# Patient Record
Sex: Male | Born: 1965 | Race: White | Hispanic: No | Marital: Single | Smoking: Former smoker
Health system: Southern US, Community
[De-identification: ages and names within clinical notes are randomized; demographics above are authoritative.]

## PROBLEM LIST (undated history)

## (undated) DIAGNOSIS — K219 Gastro-esophageal reflux disease without esophagitis: Secondary | ICD-10-CM

## (undated) DIAGNOSIS — N400 Enlarged prostate without lower urinary tract symptoms: Secondary | ICD-10-CM

## (undated) DIAGNOSIS — N1832 Chronic kidney disease, stage 3b: Secondary | ICD-10-CM

## (undated) DIAGNOSIS — F909 Attention-deficit hyperactivity disorder, unspecified type: Secondary | ICD-10-CM

## (undated) DIAGNOSIS — F419 Anxiety disorder, unspecified: Secondary | ICD-10-CM

## (undated) DIAGNOSIS — J189 Pneumonia, unspecified organism: Secondary | ICD-10-CM

## (undated) DIAGNOSIS — N529 Male erectile dysfunction, unspecified: Secondary | ICD-10-CM

## (undated) DIAGNOSIS — E039 Hypothyroidism, unspecified: Secondary | ICD-10-CM

## (undated) DIAGNOSIS — I129 Hypertensive chronic kidney disease with stage 1 through stage 4 chronic kidney disease, or unspecified chronic kidney disease: Secondary | ICD-10-CM

## (undated) DIAGNOSIS — K5900 Constipation, unspecified: Secondary | ICD-10-CM

## (undated) DIAGNOSIS — D649 Anemia, unspecified: Secondary | ICD-10-CM

## (undated) DIAGNOSIS — F32A Depression, unspecified: Secondary | ICD-10-CM

## (undated) DIAGNOSIS — Z8719 Personal history of other diseases of the digestive system: Secondary | ICD-10-CM

## (undated) DIAGNOSIS — I10 Essential (primary) hypertension: Secondary | ICD-10-CM

## (undated) DIAGNOSIS — F329 Major depressive disorder, single episode, unspecified: Secondary | ICD-10-CM

## (undated) DIAGNOSIS — J342 Deviated nasal septum: Secondary | ICD-10-CM

## (undated) DIAGNOSIS — Z9889 Other specified postprocedural states: Secondary | ICD-10-CM

## (undated) DIAGNOSIS — R112 Nausea with vomiting, unspecified: Secondary | ICD-10-CM

## (undated) DIAGNOSIS — M5030 Other cervical disc degeneration, unspecified cervical region: Secondary | ICD-10-CM

## (undated) DIAGNOSIS — E0500 Thyrotoxicosis with diffuse goiter without thyrotoxic crisis or storm: Secondary | ICD-10-CM

## (undated) DIAGNOSIS — F319 Bipolar disorder, unspecified: Secondary | ICD-10-CM

## (undated) HISTORY — DX: Depression, unspecified: F32.A

## (undated) HISTORY — PX: EYE SURGERY: SHX253

## (undated) HISTORY — PX: SPINE SURGERY: SHX786

## (undated) HISTORY — PX: HERNIA REPAIR: SHX51

## (undated) HISTORY — PX: BACK SURGERY: SHX140

## (undated) HISTORY — PX: NASAL SEPTUM SURGERY: SHX37

## (undated) HISTORY — DX: Major depressive disorder, single episode, unspecified: F32.9

## (undated) HISTORY — DX: Anxiety disorder, unspecified: F41.9

## (undated) HISTORY — DX: Gastro-esophageal reflux disease without esophagitis: K21.9

## (undated) HISTORY — PX: OTHER SURGICAL HISTORY: SHX169

## (undated) HISTORY — PX: ESOPHAGOGASTRODUODENOSCOPY: SHX1529

---

## 2002-08-27 ENCOUNTER — Inpatient Hospital Stay (HOSPITAL_COMMUNITY): Admission: EM | Admit: 2002-08-27 | Discharge: 2002-09-03 | Payer: Self-pay | Admitting: Psychiatry

## 2002-09-04 ENCOUNTER — Other Ambulatory Visit (HOSPITAL_COMMUNITY): Admission: RE | Admit: 2002-09-04 | Discharge: 2002-09-07 | Payer: Self-pay | Admitting: Psychiatry

## 2003-09-26 ENCOUNTER — Encounter: Admission: RE | Admit: 2003-09-26 | Discharge: 2003-09-26 | Payer: Self-pay | Admitting: Neurosurgery

## 2003-10-14 ENCOUNTER — Encounter: Admission: RE | Admit: 2003-10-14 | Discharge: 2003-10-14 | Payer: Self-pay | Admitting: Neurosurgery

## 2003-10-31 ENCOUNTER — Encounter: Admission: RE | Admit: 2003-10-31 | Discharge: 2003-10-31 | Payer: Self-pay | Admitting: Neurosurgery

## 2004-05-29 ENCOUNTER — Ambulatory Visit: Payer: Self-pay | Admitting: Internal Medicine

## 2004-06-03 ENCOUNTER — Ambulatory Visit: Payer: Self-pay | Admitting: Internal Medicine

## 2004-06-26 ENCOUNTER — Ambulatory Visit: Payer: Self-pay | Admitting: Gastroenterology

## 2004-07-22 ENCOUNTER — Ambulatory Visit: Payer: Self-pay | Admitting: Gastroenterology

## 2004-07-28 ENCOUNTER — Ambulatory Visit: Payer: Self-pay | Admitting: Surgery

## 2004-10-06 ENCOUNTER — Other Ambulatory Visit: Payer: Self-pay

## 2004-10-08 ENCOUNTER — Inpatient Hospital Stay: Payer: Self-pay | Admitting: Surgery

## 2005-11-26 ENCOUNTER — Ambulatory Visit: Payer: Self-pay | Admitting: Unknown Physician Specialty

## 2006-07-19 ENCOUNTER — Emergency Department: Payer: Self-pay | Admitting: Emergency Medicine

## 2009-02-10 ENCOUNTER — Ambulatory Visit: Payer: Self-pay | Admitting: Neurosurgery

## 2009-05-06 ENCOUNTER — Ambulatory Visit (HOSPITAL_COMMUNITY): Admission: RE | Admit: 2009-05-06 | Discharge: 2009-05-08 | Payer: Self-pay | Admitting: Neurosurgery

## 2009-10-14 ENCOUNTER — Ambulatory Visit: Payer: Self-pay | Admitting: Neurosurgery

## 2010-02-11 ENCOUNTER — Ambulatory Visit: Payer: Self-pay | Admitting: Neurosurgery

## 2010-08-03 LAB — COMPREHENSIVE METABOLIC PANEL
ALT: 19 U/L (ref 0–53)
Alkaline Phosphatase: 59 U/L (ref 39–117)
GFR calc Af Amer: >60 mL/min (ref >60)
GFR calc non Af Amer: >60 mL/min (ref >60)
Potassium: 4.4 mEq/L (ref 3.5–5.1)
Sodium: 139 mEq/L (ref 135–145)
Total Bilirubin: 0.6 mg/dL (ref 0.3–1.2)

## 2010-08-03 LAB — DIFFERENTIAL
Eosinophils Absolute: 0.5 10*3/uL (ref 0.0–0.7)
Eosinophils Relative: 7 % — ABNORMAL HIGH (ref 0–5)
Lymphocytes Relative: 34 % (ref 12–46)
Lymphs Abs: 2.5 10*3/uL (ref 0.7–4.0)
Neutro Abs: 3.8 10*3/uL (ref 1.7–7.7)
Neutrophils Relative %: 50 % (ref 43–77)

## 2010-08-03 LAB — CBC
Hemoglobin: 15.9 g/dL (ref 13.0–17.0)
MCHC: 34.7 g/dL (ref 30.0–36.0)
MCV: 92.5 fL (ref 78.0–100.0)
RBC: 4.94 MIL/uL (ref 4.22–5.81)
RDW: 12.9 % (ref 11.5–15.5)
WBC: 7.6 10*3/uL (ref 4.0–10.5)

## 2010-08-03 LAB — URINALYSIS, ROUTINE W REFLEX MICROSCOPIC
Glucose, UA: 100 mg/dL — AB
Ketones, ur: NEGATIVE mg/dL
Nitrite: NEGATIVE
Protein, ur: NEGATIVE mg/dL
Specific Gravity, Urine: 1.021 (ref 1.005–1.030)
Urobilinogen, UA: 0.2 mg/dL (ref 0.0–1.0)

## 2010-08-03 LAB — URINE MICROSCOPIC-ADD ON

## 2010-08-03 LAB — PROTIME-INR
INR: 0.89 (ref 0.00–1.49)
Prothrombin Time: 12 seconds (ref 11.6–15.2)

## 2010-08-03 LAB — APTT: aPTT: 25 seconds (ref 24–37)

## 2010-09-18 NOTE — H&P (Signed)
NAME:  Albert [name], Albert [name]                                ACCOUNT NO.:  [id number]   MEDICAL RECORD NO.:  [id number]                   PATIENT TYPE:  IPS   LOCATION:  0505                                 FACILITY:  [facility]   PHYSICIAN:  Albert [name], M.D.                   DATE OF BIRTH:  09-Apr-1966   DATE OF ADMISSION:  08/27/2002  DATE OF DISCHARGE:                         PSYCHIATRIC ADMISSION ASSESSMENT   IDENTIFYING INFORMATION:  A 45 year old single white male, voluntarily  admitted on August 26, 2001.   HISTORY OF PRESENT ILLNESS:  The patient presents with a history of  depression, passive suicidal thoughts.  The patient reports no sleep for the  past week, sleeping only about 2 hours per night, having racing thoughts  that he attributes to having decreased sleep.  He has been having fleeting  suicidal thoughts with no specific plan.  He feels very anxious and having  difficulty concentrating.  He denies any psychotic symptoms.  He states he  has tried several different sleep medications without any relief, and his  appetite has been fair.   PAST PSYCHIATRIC HISTORY:  First admission to [facility], has  a long history of depression.  He has an appointment with Dr. Jennelle Human, first  appointment on May 19.   SOCIAL HISTORY:  He is a 45 year old single white male with no children.  He  is an Product/process development scientist.  He works for the [employer].  He lives with his  parents, currently attending school for further education at [employer] and is working 2 jobs.   FAMILY HISTORY:  His father and grandfather committed suicide.  The patient  was 45 years old when his father committed suicide.   ALCOHOL DRUG HISTORY:  The patient has a past history of alcohol abuse, has  been sober for the past 6 months.  He denies any drug use.   PAST MEDICAL HISTORY:  Primary care [name] is Dr. Randa Lynn in [place].  Medical problems are a recent history of anemia with unknown etiology,  and  GERD.  The patient sees Dr. Sharen Hint, his gastroenterologist in [place].   MEDICATIONS:  Nexium 40 mg every day.  The patient was tried on Risperdal to  help him sleep but he states his depression thoughts became worse.   DRUG ALLERGIES:  No known allergies.   PHYSICAL EXAMINATION:  Of significance is patient's complaints of sinusitis.  His right tympanic membrane is red and inflamed.  He has scar tissue that  was noted on the right tympanic membrane from tubes.  No nasal discharge,  negative lymphadenopathy.  No sinus tenderness.  The patient also has some  nystagmus in his right eye and the patient has dentures.  VITAL SIGNS:  Temperature 99.8, heart rate 87, 18 respirations, blood  pressure 131/86.  CHEST:  Clear.   LABORATORY DATA:  CBC:  RDW  is 20.6, platelet is 402.   MENTAL STATUS EXAM:  He is an alert, cooperative male, casually dressed,  good eye contact.  Speech is clear.  Patient is very talkative.  Mood is  depressed and anxious.  The patient appears anxious.  Thought processes are  coherent, no evidence of psychosis, no suicidal or homicidal ideation or  flight of ideas.  Cognitive function intact.  Memory is good, judgment and  insight fair.   ADMISSION DIAGNOSES:   AXIS I:  1. Major depression with suicidal ideation.  2. History of alcohol abuse in partial remission.  3. Rule out bipolar.  4. Rule out anxiety disorder not otherwise specified.   AXIS II:  Deferred.   AXIS III:  History of anemia and gastroesophageal reflux disease.   AXIS IV:  Problems with occupation and other psychosocial problems.   AXIS V:  Current is 35, estimated this past year 50.   PLAN:  Voluntary admission for depression and suicidal thoughts.  Contract  for safety, check every 15 minutes.  Will resume his medications, will have  Seroquel for anxiety and sleep.  We will avoid benzos at this time due to  the patient's recent alcohol abuse.  Monitor his labs, will initiate an   antidepressant for depressive and anxious symptoms.  Medication compliance  was discussed with the patient.  We will have amoxicillin available for the  patient's complaints of otitis media and sinusitis.  Encourage fluids and  continue to monitor the patient's upper respiratory infection.  Stabilize  mood and thinking so the patient can be safe.  The patient is to follow up  with Dr. Jennelle Human and consider individual therapy for this patient.   TENTATIVE LENGTH OF CARE:  3-5 days.     Albert [name], N.P.                       Albert [name], M.D.    JO/MEDQ  D:  08/29/2002  T:  08/29/2002  Job:  [id number]

## 2010-09-18 NOTE — Discharge Summary (Signed)
NAME:  Albert [name], Albert [name]                                ACCOUNT NO.:  [id number]   MEDICAL RECORD NO.:  [id number]                   PATIENT TYPE:  IPS   LOCATION:  0505                                 FACILITY:  [facility]   PHYSICIAN:  Geoffery Lyons, M.D.                   DATE OF BIRTH:  01-28-1966   DATE OF ADMISSION:  08/27/2002  DATE OF DISCHARGE:  09/03/2002                                 DISCHARGE SUMMARY   CHIEF COMPLAINT AND PRESENT ILLNESS:  This was the first admission to [facility] for this 45 year old single white male voluntarily  admitted.  History of depression.  Passive suicidal thoughts.  No sleep for  the past week, sleeping two hours per night.  Racing thoughts that he  attributes to having decreased sleep.  Having fleeting suicidal thoughts  with no specific plan.  Anxious, having difficulty concentrating.  No  psychotic symptoms.   PAST PSYCHIATRIC HISTORY:  First time at [facility].  No history of  depression.  First appointment with Dr. Jennelle Human on Sep 19, 2002.   ALCOHOL/DRUG HISTORY:  History of alcohol abuse.  Has been sober for six  months.  No drug use.   PAST MEDICAL HISTORY:  Anemia.   MEDICATIONS:  Nexium 40 mg per day.   PHYSICAL EXAMINATION:  Performed and failed to show any acute findings.   MENTAL STATUS EXAM:  Alert, cooperative male.  Casually dressed.  Good eye  contact.  Speech is clear.  The patient is talkative.  Mood is depressed and  anxious.  The patient appears anxious.  Thought processes are coherent.  No  evidence of psychosis.  No auditory or visual hallucinations.  No flight of  ideas.  Cognition well-preserved.   ADMISSION DIAGNOSES:   AXIS I:  1. Major depression, recurrent.  2. Anxiety disorder not otherwise specified.  3. Alcohol abuse in partial remission.   AXIS II:  No diagnosis.   AXIS III:  1. Anemia.  2. Gastroesophageal reflux.   AXIS IV:  Moderate.   AXIS V:  Global Assessment of Functioning  upon admission 35; highest Global  Assessment of Functioning in the last year 70.   LABORATORY DATA:  CBC with hemoglobin 16.6.  Blood chemistries were within  normal limits.  Thyroid profile within normal limits.  Drug screen negative  for substances of abuse.   HOSPITAL COURSE:  He was admitted and started intensive individual and group  psychotherapy.  He was given Nexium 40 mg per day, trazodone for sleep,  Ativan as needed for anxiety.  Was started on Lexapro 5 mg per day.  Could  not sleep on the trazodone so Seroquel was increased to 100 mg at bedtime  and he was given 25 mg as needed for anxiety.  He was given some Allegra and  amoxicillin.  Due to symptoms suggestive of  ADHD, he was placed on Strattera  40 mg per day.  Lexapro was increased to 5 mg twice a day and Strattera was  increased to 80 mg per day.  He was not able to sleep for 20 days.  He has  been having racing thoughts.  History of depression.  Possibility of  bipolarity was considered.  The feeling was that he evidenced more of adult  ADHD as well as anxiety.  He had a longstanding history of depression with  early losses.  Father committed suicide, another death, uncertainty fears.  No history of hyperactivity, impulsivity, inattentiveness.  Increased  frustration.  Loss of control.  Some pressured speech that could be  interrupted.  Mood was depressed.  We continued to work to stabilize.  On  Sep 03, 2002, he was in full contact with reality.  No suicidal ideation.  No  homicidal ideation.  Was sleeping well.  Underlying anxiety, anticipating  discharge but mood was more euthymic.  Anticipating to continue working with  medication and with outpatient treatment.   DISCHARGE DIAGNOSES:   AXIS I:  1. Major depression, recurrent.  2. Adult attention-deficit hyperactivity disorder.  3. Anxiety disorder not otherwise specified.  4. Alcohol abuse, in partial remission.   AXIS II:  No diagnosis.   AXIS III:   Gastroesophageal reflux.   AXIS IV:  Moderate.   AXIS V:  Global Assessment of Functioning upon discharge 55-60.   DISCHARGE MEDICATIONS:  1. Protonix 40 mg 2 daily.  2. Seroquel 100 mg at night.  3. Allegra 60 mg daily.  4. Amoxicillin 500 mg every 12 hours x 2 days.  5. Strattera 40 mg, 2 daily.  6. Lexapro 10 mg a day.   FOLLOW UP:  Dr. Meredith Staggers.                                               Geoffery Lyons, M.D.    IL/MEDQ  D:  10/10/2002  T:  10/11/2002  Job:  [id number]

## 2011-04-29 ENCOUNTER — Ambulatory Visit: Payer: Self-pay | Admitting: Urology

## 2013-12-05 ENCOUNTER — Ambulatory Visit: Payer: Self-pay | Admitting: Psychiatry

## 2014-03-13 ENCOUNTER — Ambulatory Visit: Payer: Self-pay | Admitting: Psychiatry

## 2014-03-18 ENCOUNTER — Observation Stay: Payer: Self-pay | Admitting: Internal Medicine

## 2014-03-18 LAB — COMPREHENSIVE METABOLIC PANEL
ALBUMIN: 3.2 g/dL — AB (ref 3.4–5.0)
ALK PHOS: 105 U/L
ALT: 16 U/L
ANION GAP: 6 — AB (ref 7–16)
BILIRUBIN TOTAL: 0.3 mg/dL (ref 0.2–1.0)
BUN: 6 mg/dL — AB (ref 7–18)
CALCIUM: 8.8 mg/dL (ref 8.5–10.1)
CHLORIDE: 107 mmol/L (ref 98–107)
Co2: 25 mmol/L (ref 21–32)
Creatinine: 1.06 mg/dL (ref 0.60–1.30)
Glucose: 129 mg/dL — ABNORMAL HIGH (ref 65–99)
Osmolality: 275 (ref 275–301)
POTASSIUM: 3.9 mmol/L (ref 3.5–5.1)
SGOT(AST): 16 U/L (ref 15–37)
Sodium: 138 mmol/L (ref 136–145)
Total Protein: 7.3 g/dL (ref 6.4–8.2)

## 2014-03-18 LAB — URINALYSIS, COMPLETE
BILIRUBIN, UR: NEGATIVE
Bacteria: NONE SEEN
Blood: NEGATIVE
GLUCOSE, UR: NEGATIVE mg/dL (ref 0–75)
Hyaline Cast: 2
KETONE: NEGATIVE
Leukocyte Esterase: NEGATIVE
NITRITE: NEGATIVE
PH: 5 (ref 4.5–8.0)
Protein: NEGATIVE
SQUAMOUS EPITHELIAL: NONE SEEN
Specific Gravity: 1.013 (ref 1.003–1.030)

## 2014-03-18 LAB — TROPONIN I

## 2014-03-18 LAB — CBC
HCT: 38.6 % — AB (ref 40.0–52.0)
HGB: 12.6 g/dL — ABNORMAL LOW (ref 13.0–18.0)
MCH: 29.3 pg (ref 26.0–34.0)
MCHC: 32.7 g/dL (ref 32.0–36.0)
MCV: 90 fL (ref 80–100)
PLATELETS: 343 10*3/uL (ref 150–440)
RBC: 4.32 10*6/uL — ABNORMAL LOW (ref 4.40–5.90)
RDW: 13 % (ref 11.5–14.5)
WBC: 12.4 10*3/uL — ABNORMAL HIGH (ref 3.8–10.6)

## 2014-03-18 LAB — T4, FREE: Free Thyroxine: 2.42 ng/dL — ABNORMAL HIGH (ref 0.76–1.46)

## 2014-03-18 LAB — LITHIUM LEVEL: Lithium: 2.12 mmol/L

## 2014-03-18 LAB — TSH: Thyroid Stimulating Horm: <0.01 u[IU]/mL — ABNORMAL LOW

## 2014-03-19 LAB — CBC WITH DIFFERENTIAL/PLATELET
BASOS ABS: 0 10*3/uL (ref 0.0–0.1)
BASOS PCT: 0.3 %
Eosinophil #: 0.4 10*3/uL (ref 0.0–0.7)
Eosinophil %: 3.2 %
HCT: 36.4 % — AB (ref 40.0–52.0)
HGB: 11.7 g/dL — AB (ref 13.0–18.0)
Lymphocyte #: 2.3 10*3/uL (ref 1.0–3.6)
Lymphocyte %: 18.5 %
MCH: 28.6 pg (ref 26.0–34.0)
MCHC: 32 g/dL (ref 32.0–36.0)
MCV: 90 fL (ref 80–100)
Monocyte #: 0.8 x10 3/mm (ref 0.2–1.0)
Monocyte %: 6.6 %
Neutrophil #: 8.9 10*3/uL — ABNORMAL HIGH (ref 1.4–6.5)
Neutrophil %: 71.4 %
Platelet: 308 10*3/uL (ref 150–440)
RBC: 4.07 10*6/uL — AB (ref 4.40–5.90)
RDW: 12.6 % (ref 11.5–14.5)
WBC: 12.5 10*3/uL — ABNORMAL HIGH (ref 3.8–10.6)

## 2014-03-19 LAB — BASIC METABOLIC PANEL WITH GFR
Anion Gap: 6 — ABNORMAL LOW
BUN: 6 mg/dL — ABNORMAL LOW
Calcium, Total: 8.6 mg/dL
Chloride: 113 mmol/L — ABNORMAL HIGH
Co2: 25 mmol/L
Creatinine: 0.85 mg/dL
EGFR (African American): >60
EGFR (Non-African Amer.): >60
Glucose: 110 mg/dL — ABNORMAL HIGH
Osmolality: 285
Potassium: 3.8 mmol/L
Sodium: 144 mmol/L

## 2014-03-19 LAB — LITHIUM LEVEL: Lithium: 1.4 mmol/L — ABNORMAL HIGH

## 2014-03-20 LAB — CBC WITH DIFFERENTIAL/PLATELET
BASOS ABS: 0.1 10*3/uL (ref 0.0–0.1)
Basophil %: 0.5 %
EOS ABS: 0.4 10*3/uL (ref 0.0–0.7)
Eosinophil %: 3.8 %
HCT: 38.4 % — ABNORMAL LOW (ref 40.0–52.0)
HGB: 12.6 g/dL — ABNORMAL LOW (ref 13.0–18.0)
LYMPHS PCT: 28.4 %
Lymphocyte #: 3.2 10*3/uL (ref 1.0–3.6)
MCH: 29 pg (ref 26.0–34.0)
MCHC: 32.8 g/dL (ref 32.0–36.0)
MCV: 89 fL (ref 80–100)
Monocyte #: 1 x10 3/mm (ref 0.2–1.0)
Monocyte %: 8.7 %
Neutrophil #: 6.5 10*3/uL (ref 1.4–6.5)
Neutrophil %: 58.6 %
Platelet: 346 10*3/uL (ref 150–440)
RBC: 4.34 10*6/uL — AB (ref 4.40–5.90)
RDW: 12.5 % (ref 11.5–14.5)
WBC: 11.2 10*3/uL — AB (ref 3.8–10.6)

## 2014-03-20 LAB — LITHIUM LEVEL: LITHIUM: 0.82 mmol/L

## 2014-03-20 LAB — BASIC METABOLIC PANEL
Anion Gap: 4 — ABNORMAL LOW (ref 7–16)
BUN: 7 mg/dL (ref 7–18)
CHLORIDE: 112 mmol/L — AB (ref 98–107)
CREATININE: 0.78 mg/dL (ref 0.60–1.30)
Calcium, Total: 9.5 mg/dL (ref 8.5–10.1)
Co2: 26 mmol/L (ref 21–32)
EGFR (Non-African Amer.): >60
Glucose: 99 mg/dL (ref 65–99)
Osmolality: 281 (ref 275–301)
Potassium: 4.1 mmol/L (ref 3.5–5.1)
SODIUM: 142 mmol/L (ref 136–145)

## 2014-03-21 ENCOUNTER — Ambulatory Visit: Payer: Self-pay

## 2014-07-12 ENCOUNTER — Ambulatory Visit: Payer: Self-pay | Admitting: Unknown Physician Specialty

## 2014-08-24 NOTE — Consult Note (Signed)
Allergies:  Prednisone: Other  Assessment/Plan:  Assessment/Plan Patient is a 49 y.o M seen in consultation for hyperthyroidism. He has a h.o biopolar disorder treated with lithium. Brought in by mother with AMS. Lithium level found to be elevated.   Pt was seen by me in clinic as a consult for hyperthyroidism last week. He has had hyperthyroidism for several months, yet untreated. 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 I ordered and scheduled a thyroid uptake/scan, which is scheduled for tomorrow 11/18 and Thurs 11/19.   Pt was interviewed and examined and chart reviewed.  Today he is confused and mixes his words. This is a change in his mental status from last week.   Labs show TSH <0.015 and free T4 2.4 (lower than level last week of 2.6).  A/ Biopolar disorder Lithium toxicity Grave's hyperthyroidism  P/ -I attribute his anxiety, tremor, heart racing to Grave's disease. -Not clear if his AMS is due to hyperthyroidism, as he has been hyperthyroid for many months and yet this AMS started just 2 days ago -Will start propranolol 60 mg bid for tachycardia and tremor. Will titrate dose to HR <100. -If possible, would like pt to have his thyroid uptake/scan as scheduled tomorrow and Thursday. However, if this is scheduled for the [address] [facility] site, then we may not be able to get this done. I called [facility] today but they were closed. -Will await starting methimazole until we get the uptake/scan done, or if we cannot get it done then will start it tomorrow. -Please AVOID IODINATED CONTRAST DYE as this will exacerbate his hyperthyroidism  Full consult to be dictated.   Electronic Signatures: Raj JanusSolum, Yosef Krogh M (MD)  (Signed 17-Nov-15 17:19)  Authored: ALLERGIES, Assessment/Plan   Last Updated: 17-Nov-15 17:19 by Raj JanusSolum, Yesmin Mutch M (MD)

## 2014-08-24 NOTE — H&P (Signed)
PATIENT NAME:  Albert [name], Albert [name] MR#:  [id number] DATE OF BIRTH:  May 28, 1965  DATE OF ADMISSION:  03/18/2014  PRIMARY CARE PHYSICIAN:  Kandyce RudMarcus Babaoff, MD   PSYCHIATRIST:  Kirstie Periarey G Cottle, [place], in [place].   REFERRING EMERGENCY ROOM PHYSICIAN:  Onnie BoerKevin [name] Paduchowski, MD   CHIEF COMPLAINT: Mania.   HISTORY OF PRESENT ILLNESS: This very pleasant 49 year old man presents today accompanied by his mother with complaint of mania. He has [name] history of bipolar disorder. He is very anxious and unable to focus, so his mother provides the history. She reports that he has been having some difficulty with his memory for the past few weeks. It has been mainly short-term memory that has been affected. He has been taking classes, at [employer], but had to drop out of these classes over the past few weeks due to inability to focus. Yesterday the patient had dinner with his mother and she felt that he was behaving fairly normally. Today, she spoke to him on the phone; he was confused, hallucinating, unable to focus on the conversation. She went to his home to pick him up and brought him to the Emergency Room. At the time of examination he is very talkative, tangential, tremulous, hospitalist services are asked to admit for further evaluation and treatment. On presentation to the Emergency Room, his lithium level is noted to be elevated at 2.5.   PAST MEDICAL HISTORY: Problem:  1.  Bipolar disorder.  2.  Benign prosthetic hypertrophy.  3.  Esophageal reflux disease.  4.  Hyperthyroidism, the patient has seen  [name]. Wendall MolaMelissa Solum, MD    PAST SURGICAL HISTORY: 1.  Hiatal hernia repair.   SOCIAL HISTORY: The patient lives alone, but is in contact with his mother daily. He also has [name] close friend who is [name] Engineer, civil (consulting)nurse, who assisted in his care. He does not smoke cigarettes, drink alcohol, or use any illicit substances. He is disabled.   FAMILY HISTORY: Positive for coronary artery disease in several grandparents. His father died of  suicide.   ALLERGIES: THE PATIENT IS ALLERGIC TO PREDNISONE.   HOME MEDICATIONS: 1.  Zyrtec 10 mg 1 tablet once [name] day.  2.  Seroquel 200 mg 2 tablets once [name] day at bedtime.  3.  Rexulti 2 mg 1 tablet once [name] day; note that the patient has not been taking [name] full dose of this medication, this was started about 6 weeks ago and it sounds as if he may have even stopped it 1 week ago.  4.  Pramipexole 0.125 mg 1 tablet 3 times [name] day.  5.  Omeprazole 20 mg 1 tablet once [name] day.  6.  Mucinex 1 tablet every 4 hours as needed for cough.  7.  Lithium 300 mg 3 tablets once [name] day at bedtime, 300 mg 1 capsule [name] day in the morning.  8.  Ibuprofen 200 mg 4 capsules every 6 hours as needed for pain.  9.  Flomax 0.4 mg 1 capsule once [name] day.  10.  Clonidine 0.1 mg orally twice [name] day.  11.  Clonazepam 0.5 mg 1 tablet twice [name] day, 0.5 mg 2 tablets once [name] day at bedtime.   REVIEW OF SYSTEMS: The patient is unable to perform review of systems due to altered mental status, inability to focus.   PHYSICAL EXAMINATION: VITAL SIGNS: Temperature 98.9, pulse 95, respirations 26, blood pressure 118/81, oxygenation 99% on room air.  GENERAL: The patient is extremely anxious cannot stop moving.  HEENT: Pupils are  equal, round, and reactive to light. Conjunctivae are clear with no icterus or injection, mucous membranes are dry. Good dentition. Posterior oropharynx is clear with no exudate or edema.  NECK: No cervical lymphadenopathy, trachea is midline, thyroid is nontender.  RESPIRATORY: Lungs are clear to auscultation bilaterally with good air movement.  CARDIOVASCULAR: Tachycardic, regular. No edema. Peripheral pulses are 2+.  ABDOMEN: Soft, nontender, nondistended, bowel sounds are normal. No guarding, no rebound.  MUSCULOSKELETAL: No joint effusions, range of motion is normal, strength is 5/5 throughout.  NEUROLOGIC: Cranial nerves II through XII are intact; strength and sensation are intact, he is very tremulous, has  [name] tremor in both hands and both feet.  PSYCHIATRIC: The patient is anxious, tangential, slightly agitated; he is alert; he is oriented but unable to focus on questioning.   LABORATORY: Sodium 138, potassium 3.9, chloride 107, bicarbonate 25, BUN 6, creatinine 1.06 glucose 129; LFTs normal with exception of decreased albumin at 3.2; troponin negative; TSH decreased at 0.01, thyroxine elevated at 2.42, lithium elevated at 2.12, white blood cells 12.4, hemoglobin 12.6, hematocrit 38.6, platelets 343,000, MCV 90; urinalysis negative for signs of infection.   IMAGING: CT of the head without contrast shows no acute intracranial abnormality, probable bilateral ethmoid sinusitis with mild diffuse cortical atrophy.   ASSESSMENT AND PLAN: 1.  Bipolar disorder with current manic state: We will treat with Ativan at this time, we will consult psychiatry for further evaluation and treatment.  2.  Lithium toxicity: Hold lithium for now. Provide aggressive hydration. EKG shows no changes. Admit to telemetry.  3.  Hyperthyroidism: Repeat thyroid studies indicate persistent hyperthyroidism. We will get endocrinology consultation, may need to start methimazole treatment.  4.  Benign prostatic hypertrophy: Continue Flomax.  5.  Prophylaxis: The patient is [name] very active, ambulatory, we will hold off on pharmacologic deep vein thrombosis prophylaxis.   TIME SPENT ON ADMISSION: Was 45 minutes.    ____________________________ Ena Dawley. Clent Ridges, MD cpw:nt D: 03/18/2014 22:45:15 ET T: 03/18/2014 23:13:47 ET JOB#: [id number]  cc: [name] Evans P. Clent Ridges, MD, <Dictator> Gale Journey MD ELECTRONICALLY SIGNED 03/21/2014 13:20

## 2014-08-24 NOTE — Consult Note (Signed)
Brief Consult Note: Diagnosis: delirium poss related lithium toxicity/other causes.   Consult note dictated.   Recommend further assessment or treatment.   Orders entered.   Comments: Psychiatry: Chart reviewwed. Hx obtained from mother. Patient asleep and she asks we not waken him right now. Sounds like possible combination of causes of delirium but Li level was clearly elevated. Agree with plan for MRI and to hold his rexulti and pramapexole and, obviously, lithium. Ordered recheck lithium tomorrow am. Continue IVF. Look forward to endocrine input regarding hyperthyroid and with order frequent prn doses of IV ativan and haldol for agitation and confusion. Will follow. I have left a message at office of Dr Jennelle Humanottle, outpt psychiatrist, to call me back.  Electronic Signatures: Nashay Brickley, Jackquline DenmarkJohn T (MD)  (Signed 17-Nov-15 13:13)  Authored: Brief Consult Note   Last Updated: 17-Nov-15 13:13 by Audery Amellapacs, Deverick Pruss T (MD)

## 2014-08-24 NOTE — Consult Note (Signed)
PATIENT NAME:  Albert Albert [name], Albert Albert [name] MR#:  [id number] DATE OF BIRTH:  1966-04-07  DATE OF CONSULTATION:  03/19/2014  REFERRING PHYSICIAN:  Sital P. Benjie Karvonen, MD  CONSULTING PHYSICIAN:  Albert [name]. Lavone Orn, MD  CHIEF COMPLAINT:  Hyperthyroidism.   HISTORY OF PRESENT ILLNESS:  This is Albert [name] 49 year old male with Albert [name] history of hyperthyroidism and bipolar disorder, who was admitted yesterday for altered mental status. He was initially found to have an elevated lithium level of 2.12, low TSH of 0.10, and an elevated free T4 level of 2.42. Noncontrast head CT showed no acute findings. The patient has been admitted, and psychiatry has been following. His lithium has been held.   The patient presented to me last week to clinic with hyperthyroidism. Review of labs indicates he has had hyperthyroidism since at least July 2015. No use of amiodarone, glucocorticoids, or known exposure to iodinated contrast dye. He has taken lithium for the last 3 years. I confirmed hyperthyroidism and also obtained Albert [name] thyrotropin antibody level, which was elevated and consistent with autoimmune hyperthyroidism. Albert [name] thyroid uptake/scan was ordered, and these are scheduled for tomorrow and Wednesday. The patient reports chronic tremor, unchanged. He denies heart racing or palpitations. No heat intolerance. He has had Albert [name] 10 to 12-pound weight loss over the last 3 months. No neck pain or recent illness.   PAST MEDICAL HISTORY: 1.  Bipolar disorder.  2.  BPH.  3.  Hiatal hernia.  4.  GERD.  5.  Irritable bowel syndrome.  6.  Erectile dysfunction.  7.  Hyperthyroidism.   PAST SURGICAL HISTORY: 1.  Hernia repair.  2.  Deviated septum repair.  3.  Strabismus surgery.  4.  Nissen fundoplication.   OUTPATIENT MEDICATIONS INCLUDE:  1.  Klonopin 1 mg 1 to 4 tablets at bedtime p.r.n. insomnia.  2.  Lithium 1200 mg nightly.  3.  Prilosec 20 mg daily.  4.  Quetiapine 200 mg 2 times daily.  5.  Sildenafil 100 mg once daily.  6.  Flomax 0.8 mg once  daily.   SOCIAL HISTORY:  No tobacco use. No known alcohol use.   FAMILY HISTORY:  No known thyroid disease.   REVIEW OF SYSTEMS: GENERAL:  No fever. He has had fatigue.  HEENT:  Denies blurred vision. Denies sore throat.  NECK:  Denies neck pain or swelling.  CARDIAC:  Denies chest pain or palpitations.  PULMONARY:  The patient has shortness of breath. He has had Albert [name] cough.  GASTROINTESTINAL:  Denies abdominal pain. Denies nausea and vomiting.  GENITOURINARY:  Denies dysuria or hematuria.  EXTREMITIES:  Denies leg swelling. Denies weakness of the extremities.  SKIN:  No rash or recent skin changes.  HEMATOLOGIC:  Denies easy bruisability or recent bleeding.   PHYSICAL EXAMINATION: VITAL SIGNS:  Height is 67.9 inches, weight 185 pounds, and BMI is 28.2. Temperature is 99.5, pulse 86, respirations 18, and blood pressure is 131/80.  GENERAL:  Well-developed white male in no acute distress.  HEENT:  No proptosis, lid lag, or stare. Oropharynx is clear. Mucous membranes are moist.  NECK:  Supple. No appreciable thyromegaly. No neck tenderness.  CARDIAC:  Tachycardia without murmur. No carotid bruit.  LUNGS:  Clear to auscultation bilaterally. No wheeze.  ABDOMEN:  Diffusely soft, nontender, nondistended.  EXTREMITIES:  No peripheral edema is present.  NEUROLOGIC:  There is Albert [name] fine tremor of the outstretched hands.  PSYCHIATRIC:  Alert and oriented. Seems confused. Some trouble with confusing words, although he is conversational and appropriate.  LABORATORY DATA:  Glucose is 110, BUN 6, creatinine 0.85, sodium 144, potassium 3.8, and chloride is 113. EGFR is greater than 60. Initial lithium level was 2.12; repeat today is 1.40. Hemoglobin is 11.7, hematocrit 36.4, and WBC is 12.5.   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 He has had hyperthyroidism for several months, again initially presented to endocrinology only one week ago. He is not  yet started on medical therapy, as he has been scheduled for Albert [name] thyroid uptake/scan tomorrow. He is now with new-onset altered mental status; this is felt to be unlikely due to his hyperthyroidism, which again has been persistent for several months. Suspect his lithium toxicity is playing Albert [name] role as well as his underlying bipolar disorder.   RECOMMENDATIONS: 1.  If possible, the patient is to undergo Albert [name] thyroid uptake/scan tomorrow and Wednesday. We will see if this is possible. If not, it will need to be canceled, and he could then start methimazole to better control his hyperthyroidism.  2.  Propranolol 60 mg b.i.d.  3.  Continue workup for altered mental status as you are.   Thank you for the kind request for consultation. I will follow along with you.    ____________________________ Albert [name]. Lavone Orn, MD ams:nb D: 03/19/2014 21:46:20 ET T: 03/19/2014 21:57:51 ET JOB#: [id number]  cc: Albert [name]. Lavone Orn, MD, <Dictator> Sherlon Handing MD ELECTRONICALLY SIGNED 04/03/2014 17:35

## 2014-08-24 NOTE — Discharge Summary (Signed)
PATIENT NAME:  Albert [name] MR#:  [id number] DATE OF BIRTH:  18-Sep-1965  ADMISSION DIAGNOSIS: Altered mental status.   DISCHARGE DIAGNOSES: 1.  Acute metabolic encephalopathy secondary to elevated lithium level.  2.  Hyperthyroidism.  3.  Bipolar affective disorder with lithium toxicity.  4.  BPH.  5.  Cough and probable sinus infection.   CONSULTATIONS:  1.  Audery Amel, MD from psychiatry.  2.  Macy Mis, MD, endocrinology.   PERTINENT LABORATORIES AT DISCHARGE: Lithium level 0.82. White blood cells 11, hemoglobin 12.6, hematocrit 39, platelets of 346,000 sodium 142, potassium 4.1, chloride 112, bicarbonate 26, BUN 7, creatinine 0.7, and glucose is 99.   DISCHARGE PHYSICAL EXAMINATION:  VITAL SIGNS:  The patient is afebrile. Temperature 98.4, pulse is 81, respirations 20, blood pressure 137/87, 98 on room air.  GENERAL: The patient is alert and oriented, not in acute distress.  CARDIOVASCULAR: Regular rate and rhythm. No murmurs, gallops, or rubs. PMI is not displaced.  LUNGS: Clear to auscultation without crackles, rales, rhonchi, or wheezing. Normal to percussion.  HEENT: The patient has some mild sinus tenderness at the right sinus. There is no erythema on oropharynx inspection.  ABDOMEN: Bowel sounds present. Nontender, nondistended. No hepatosplenomegaly.  NEUROLOGIC: Cranial nerves II-XII are intact. There are no focal deficits.  MUSCULOSKELETAL: Strength 5/5 in all extremities.  SKIN: Without rash or lesions.   HOSPITAL COURSE: This is [name] 49 year old male who presented with confusion. For further details, please refer to the H and P.   1.  Encephalopathy, acute, likely secondary to lithium toxicity. The patient is completely back to his baseline. His lithium obviously was held. I suspect that this is all due to lithium.  He is noted to hyperthyroidism as well, but this has been [name] chronic issue for the past month or so, and not contributing to the acute encephalopathy.  RPR was negative.  2.  History of hyperthyroidism. We appreciate Dr. Pricilla Handler consultation. The patient has been seen by Dr. Tedd Sias as outpatient for hyperthyroidism. He will have [name] thyroid uptake scan as an outpatient performed tomorrow and on Friday. He was started on propranolol as per Dr. Tedd Sias and will follow up with Dr. Tedd Sias after the testing to see if the patient needs [name] methimazole.  3.  Lithium toxicity with bipolar affective disorder. Lithium was held. The same could be contributing to hyperthyroidism as well. We appreciate psychiatry consultation. I did speak with Dr. Toni Amend and the family and for now the patient will need to talk to his psychiatrist, which the patient's mother did call the psychiatrist to see what medications the patient should be discharged on and which medications he should continue. 4.  History of BPH. The patient is to continue on Flomax.  5.  Sinus infection with [name] cough. The patient started on Augmentin.   DISCHARGE MEDICATIONS: 1.  Augmentin 875 mg p.o. b.i.d. for 9 days.  2.  Propranolol 60 mg p.o. b.i.d.  3.  Clonazepam 0.5 mg b.i.d.  4.  Ibuprofen 200 mg 4 tablets q. 6 hours p.r.n.  5.  Zyrtec 10 mg daily. 6.  Clonazepam 0.5 two tablets at bedtime.  7.  Omeprazole 20 mg daily. 8.  Flomax 0.4 mg daily. 9.  Again, the rest of his psychiatric medications will be conferred by his psychiatrist.   DISCHARGE DIET: Regular diet.   DISCHARGE ACTIVITY: As tolerated.   DISCHARGE FOLLOWUP:  The patient will follow up with Dr. Tedd Sias in 1-2 weeks, as well as  Dr. Alcide Cleveraudill, his psychiatrist, tomorrow.   TIME SPENT: Approximately 40 minutes.       The patient was stable for discharge. Plan of care was discussed with the patient and family.    ____________________________ Janyth ContesSital P. Juliene PinaMody, MD spm:LT D: 03/20/2014 12:12:29 ET T: 03/20/2014 17:44:07 ET JOB#: [id number]  cc: Daja Shuping P. Juliene PinaMody, MD, <Dictator> Dr. Alcide Cleveraudill, psychiatrist [name]. Wendall MolaMelissa Solum, MD Patricia PesaSITAL P Rosamae Rocque  MD ELECTRONICALLY SIGNED 03/20/2014 20:48

## 2014-08-24 NOTE — Consult Note (Signed)
PATIENT NAME:  Albert Albert [name], Albert Albert [name] MR#:  [id number] DATE OF BIRTH:  [birth year]  DATE OF CONSULTATION:  03/19/2014  REFERRING PHYSICIAN:   CONSULTING PHYSICIAN:  Albert Amel, MD  IDENTIFYING INFORMATION AND REASON FOR CONSULTATION: Albert [name] 49 year old man with Albert [name] history of mood disorder, possible bipolar disorder, who was brought into the hospital with just Albert [name] couple days of worsening confusion, consultation for history of bipolar disorder, and current lithium use.   HISTORY OF PRESENT ILLNESS: The patient was sleeping and his mother asked me not to wake him up as he had not slept in several days. Instead, history was obtained from his mother. The patient states that this past Thursday the patient saw his outpatient psychiatrist. [name] the mother noted that the patient was not speaking clearly and seemed confused. She asked him whether it was possible if he had taken any extra medication and the patient was unclear whether or not he could have done this. Mother reports that he has not slept well and his sleep has been chaotic for Albert [name] couple of days. The patient had not been complaining of any worse depression than usual and had not said anything to suggest any recent suicidality. When brought into the hospital here, he had Albert [name] lithium level of 2.12. Mother, again, does not think that there is any chance that he could have taken intentionally an overdose of any medication. He has not been drinking or abusing any drugs. She reports that his outpatient psychiatrist had started him on Albert [name] new medication about 6 or 7 weeks ago. It appears that that was probably Rexulti.  Mother reports that when he had increased the dose of that he got some symptoms that were diagnosed as akathisia. The patient has been diagnosed as having bipolar disorder from what the mother knows, but she says there has never been an episode of what she would call mania. She says he was inpatient at [facility] some years ago and the symptoms that she  describes with that could be manic; it is hard for me to tell. She denies that he has ever tried to kill himself or been suicidal. He sees Dr. Jennelle [name] at [facility].   PAST MEDICAL HISTORY: The patient has restless leg syndrome. He has his history of psychiatric disease. He recently has been found to be hyperthyroid. He saw Dr. Tedd [name] in consultation and he is scheduled for further workup of that.   SUBSTANCE ABUSE HISTORY: No issue of ongoing substance abuse.   SOCIAL HISTORY: The patient evidently lives independently, although it sounds like his mother checks up on him, and is very aware of his treatment quite closely. The patient is disabled.   FAMILY HISTORY: Unknown.   REVIEW OF SYSTEMS: The patient was not able to give any review of systems on his own.   MENTAL STATUS EXAMINATION: I was not able to interview the patient today. He was asleep and seemed to be resting fairly calmly. No further details available.   LABORATORY RESULTS: Lithium level on admission was 2.12. His free thyroxine was 2.42. TSH less than 0.01. Chemistry panel: Low albumin 3.2, slightly elevated glucose 129, BUN and creatinine normal. Followup lithium level done this morning 1.4.   ASSESSMENT: This is Albert [name] 49 year old man with Albert [name] history of mood disorder, possible bipolar disorder. Presented with Albert [name] couple days of confusion and altered mental status. Lithium level is elevated. Unclear if he could have accidentally taken extra medication. The patient has newly discovered hyperthyroidism. Dr. Tedd [name] saw him  for followup consult today and suspects that some of his anxiety and jitteriness could be related to his hyperthyroidism. His lithium level is already starting to come down towards normal.   TREATMENT PLAN: I have [name] and left Albert [name] message. I have heard back secondhand that he will get in touch with me tomorrow. Meanwhile, continue to keep the patient off his lithium. I have written an order  to recheck the lithium level tomorrow morning. Discontinue the Rexulti and also his pramipexole. I have written orders for p.r.n. Ativan and Haldol IV for his agitation if needed. An MRI scan was ordered today and has been read without any clear new onset problem. 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 Nothing else diagnostic apparently to it.   DIAGNOSIS, PRINCIPAL AND PRIMARY:  AXIS I: Delirium, probably multifactorial.   SECONDARY DIAGNOSES: AXIS I: Bipolar disorder, not otherwise specified.  AXIS II: Deferred.  AXIS III: Hyperthyroid.    ____________________________ Albert AmelJohn T. Clapacs, MD jtc:at D: 03/19/2014 17:35:16 ET T: 03/19/2014 17:45:28 ET JOB#: [id number]  cc: Albert AmelJohn T. Clapacs, MD, <Dictator> Albert AmelJOHN T CLAPACS MD ELECTRONICALLY SIGNED 04/12/2014 19:06

## 2015-03-26 ENCOUNTER — Other Ambulatory Visit: Payer: Self-pay | Admitting: Internal Medicine

## 2015-03-26 DIAGNOSIS — E0500 Thyrotoxicosis with diffuse goiter without thyrotoxic crisis or storm: Secondary | ICD-10-CM

## 2015-04-07 ENCOUNTER — Encounter
Admission: RE | Admit: 2015-04-07 | Discharge: 2015-04-07 | Disposition: A | Discharge status: Home / Self Care | Payer: Medicare Other | Source: Ambulatory Visit | Attending: Internal Medicine | Admitting: Internal Medicine

## 2015-04-07 DIAGNOSIS — E0500 Thyrotoxicosis with diffuse goiter without thyrotoxic crisis or storm: Secondary | ICD-10-CM | POA: Insufficient documentation

## 2015-04-07 MED ORDER — SODIUM IODIDE I-123 7.4 MBQ PO CAPS
146.8580 | ORAL_CAPSULE | Freq: Once | ORAL | Status: AC
  Administered 2015-04-07: 146.858 via ORAL

## 2015-04-08 ENCOUNTER — Encounter
Admission: RE | Admit: 2015-04-08 | Discharge: 2015-04-08 | Disposition: A | Discharge status: Home / Self Care | Payer: Medicare Other | Source: Ambulatory Visit | Attending: Internal Medicine | Admitting: Internal Medicine

## 2015-04-08 DIAGNOSIS — E0500 Thyrotoxicosis with diffuse goiter without thyrotoxic crisis or storm: Secondary | ICD-10-CM | POA: Diagnosis not present

## 2015-04-11 ENCOUNTER — Ambulatory Visit
Admission: RE | Admit: 2015-04-11 | Discharge: 2015-04-11 | Disposition: A | Discharge status: Home / Self Care | Payer: Medicare Other | Source: Ambulatory Visit | Attending: Internal Medicine | Admitting: Internal Medicine

## 2015-04-11 DIAGNOSIS — E0500 Thyrotoxicosis with diffuse goiter without thyrotoxic crisis or storm: Secondary | ICD-10-CM | POA: Insufficient documentation

## 2015-04-11 HISTORY — PX: NM I- 131 THERAPY FOR ABLATION (ARMC HX): HXRAD1202

## 2015-04-11 MED ORDER — SODIUM IODIDE I 131 CAPSULE
18.2000 | Freq: Once | INTRAVENOUS | Status: AC | PRN
  Administered 2015-04-11: 18.2 via ORAL

## 2015-08-14 ENCOUNTER — Ambulatory Visit: Payer: Medicare Other | Attending: Neurology

## 2015-08-14 DIAGNOSIS — R0683 Snoring: Secondary | ICD-10-CM | POA: Insufficient documentation

## 2015-08-14 DIAGNOSIS — G4733 Obstructive sleep apnea (adult) (pediatric): Secondary | ICD-10-CM | POA: Diagnosis present

## 2015-12-31 ENCOUNTER — Other Ambulatory Visit: Payer: Self-pay | Admitting: Unknown Physician Specialty

## 2015-12-31 DIAGNOSIS — M545 Low back pain, unspecified: Secondary | ICD-10-CM

## 2015-12-31 DIAGNOSIS — M5441 Lumbago with sciatica, right side: Secondary | ICD-10-CM

## 2015-12-31 DIAGNOSIS — G8929 Other chronic pain: Secondary | ICD-10-CM

## 2016-01-02 ENCOUNTER — Encounter (INDEPENDENT_AMBULATORY_CARE_PROVIDER_SITE_OTHER): Payer: Self-pay

## 2016-01-02 ENCOUNTER — Ambulatory Visit
Admission: RE | Admit: 2016-01-02 | Discharge: 2016-01-02 | Disposition: A | Discharge status: Home / Self Care | Payer: Medicare Other | Source: Ambulatory Visit | Attending: Unknown Physician Specialty | Admitting: Unknown Physician Specialty

## 2016-01-02 DIAGNOSIS — M2578 Osteophyte, vertebrae: Secondary | ICD-10-CM | POA: Diagnosis not present

## 2016-01-02 DIAGNOSIS — M545 Low back pain, unspecified: Secondary | ICD-10-CM

## 2016-01-02 DIAGNOSIS — M5441 Lumbago with sciatica, right side: Secondary | ICD-10-CM

## 2016-01-02 DIAGNOSIS — G8929 Other chronic pain: Secondary | ICD-10-CM | POA: Diagnosis present

## 2016-01-02 DIAGNOSIS — Z9889 Other specified postprocedural states: Secondary | ICD-10-CM | POA: Insufficient documentation

## 2016-04-19 ENCOUNTER — Other Ambulatory Visit: Payer: Self-pay | Admitting: Neurosurgery

## 2016-04-19 DIAGNOSIS — M47816 Spondylosis without myelopathy or radiculopathy, lumbar region: Secondary | ICD-10-CM

## 2016-04-28 ENCOUNTER — Other Ambulatory Visit: Payer: Self-pay | Admitting: Neurosurgery

## 2016-04-28 DIAGNOSIS — M47816 Spondylosis without myelopathy or radiculopathy, lumbar region: Secondary | ICD-10-CM

## 2016-05-04 ENCOUNTER — Ambulatory Visit
Admission: RE | Admit: 2016-05-04 | Discharge: 2016-05-04 | Disposition: A | Discharge status: Home / Self Care | Payer: Medicare Other | Source: Ambulatory Visit | Attending: Neurosurgery | Admitting: Neurosurgery

## 2016-05-04 DIAGNOSIS — Z9889 Other specified postprocedural states: Secondary | ICD-10-CM | POA: Diagnosis not present

## 2016-05-04 DIAGNOSIS — M47816 Spondylosis without myelopathy or radiculopathy, lumbar region: Secondary | ICD-10-CM | POA: Diagnosis not present

## 2016-05-04 DIAGNOSIS — M4186 Other forms of scoliosis, lumbar region: Secondary | ICD-10-CM | POA: Diagnosis not present

## 2016-05-07 ENCOUNTER — Ambulatory Visit
Admission: RE | Admit: 2016-05-07 | Discharge: 2016-05-07 | Disposition: A | Discharge status: Home / Self Care | Payer: Medicare Other | Source: Ambulatory Visit | Attending: Neurosurgery | Admitting: Neurosurgery

## 2016-05-07 DIAGNOSIS — M47816 Spondylosis without myelopathy or radiculopathy, lumbar region: Secondary | ICD-10-CM

## 2016-05-07 MED ORDER — IOPAMIDOL (ISOVUE-M 200) INJECTION 41%
1.0000 mL | Freq: Once | INTRAMUSCULAR | Status: AC
Start: 2016-05-07 — End: 2016-05-07
  Administered 2016-05-07: 1 mL via INTRA_ARTICULAR

## 2016-05-07 MED ORDER — METHYLPREDNISOLONE ACETATE 40 MG/ML INJ SUSP (RADIOLOG
120.0000 mg | Freq: Once | INTRAMUSCULAR | Status: AC
  Administered 2016-05-07: 60 mg via INTRA_ARTICULAR

## 2016-05-07 NOTE — Discharge Instructions (Signed)

## 2016-08-21 ENCOUNTER — Emergency Department
Admission: EM | Admit: 2016-08-21 | Discharge: 2016-08-21 | Disposition: A | Discharge status: Home / Self Care | Payer: Medicare Other | Attending: Emergency Medicine | Admitting: Emergency Medicine

## 2016-08-21 ENCOUNTER — Encounter: Payer: Self-pay | Admitting: Emergency Medicine

## 2016-08-21 ENCOUNTER — Emergency Department: Payer: Medicare Other

## 2016-08-21 DIAGNOSIS — R112 Nausea with vomiting, unspecified: Secondary | ICD-10-CM | POA: Diagnosis present

## 2016-08-21 DIAGNOSIS — R42 Dizziness and giddiness: Secondary | ICD-10-CM | POA: Insufficient documentation

## 2016-08-21 DIAGNOSIS — K2900 Acute gastritis without bleeding: Secondary | ICD-10-CM | POA: Diagnosis not present

## 2016-08-21 HISTORY — DX: Bipolar disorder, unspecified: F31.9

## 2016-08-21 LAB — COMPREHENSIVE METABOLIC PANEL
ALT: 29 U/L (ref 17–63)
ANION GAP: 9 (ref 5–15)
AST: 24 U/L (ref 15–41)
Albumin: 4.6 g/dL (ref 3.5–5.0)
Alkaline Phosphatase: 63 U/L (ref 38–126)
BUN: 8 mg/dL (ref 6–20)
CALCIUM: 9.8 mg/dL (ref 8.9–10.3)
CO2: 23 mmol/L (ref 22–32)
Chloride: 105 mmol/L (ref 101–111)
Creatinine, Ser: 1.01 mg/dL (ref 0.61–1.24)
Glucose, Bld: 124 mg/dL — ABNORMAL HIGH (ref 65–99)
Potassium: 3.5 mmol/L (ref 3.5–5.1)
Sodium: 137 mmol/L (ref 135–145)
TOTAL PROTEIN: 7.8 g/dL (ref 6.5–8.1)
Total Bilirubin: 1 mg/dL (ref 0.3–1.2)

## 2016-08-21 LAB — CBC
HCT: 43.9 % (ref 40.0–52.0)
Hemoglobin: 15.1 g/dL (ref 13.0–18.0)
MCH: 32.3 pg (ref 26.0–34.0)
MCHC: 34.4 g/dL (ref 32.0–36.0)
MCV: 94.1 fL (ref 80.0–100.0)
PLATELETS: 413 10*3/uL (ref 150–440)
RBC: 4.67 MIL/uL (ref 4.40–5.90)
RDW: 13.2 % (ref 11.5–14.5)
WBC: 9.2 10*3/uL (ref 3.8–10.6)

## 2016-08-21 LAB — LITHIUM LEVEL: LITHIUM LVL: 1 mmol/L (ref 0.60–1.20)

## 2016-08-21 LAB — TROPONIN I: Troponin I: <0.03 ng/mL (ref <0.03)

## 2016-08-21 MED ORDER — MECLIZINE HCL 25 MG PO TABS
25.0000 mg | ORAL_TABLET | Freq: Once | ORAL | Status: AC
  Administered 2016-08-21: 25 mg via ORAL
  Filled 2016-08-21: qty 1

## 2016-08-21 MED ORDER — MECLIZINE HCL 25 MG PO TABS: 25.0000 mg | ORAL_TABLET | Freq: Three times a day (TID) | ORAL | 0 refills | Status: DC | PRN

## 2016-08-21 MED ORDER — ONDANSETRON HCL 4 MG/2ML IJ SOLN
4.0000 mg | Freq: Once | INTRAMUSCULAR | Status: AC
  Administered 2016-08-21: 4 mg via INTRAVENOUS
  Filled 2016-08-21: qty 2

## 2016-08-21 MED ORDER — ONDANSETRON 4 MG PO TBDP: 4.0000 mg | ORAL_TABLET | Freq: Three times a day (TID) | ORAL | 0 refills | Status: DC | PRN

## 2016-08-21 MED ORDER — SODIUM CHLORIDE 0.9 % IV SOLN
1000.0000 mL | Freq: Once | INTRAVENOUS | Status: AC
  Administered 2016-08-21: 1000 mL via INTRAVENOUS

## 2016-08-21 NOTE — ED Triage Notes (Signed)
States nausea and vomiting for 3 days. Brought from [facility] for lithium level, uses for bipolar disorder.

## 2016-08-21 NOTE — ED Provider Notes (Signed)
[facility] Emergency Department Provider Note   ____________________________________________    I have reviewed the triage vital signs and the nursing notes.   HISTORY  Chief Complaint Emesis     HPI Albert [name] is a 51 y.o. male who presents with complaints of nausea and vomiting and dizziness. Sent by urgent care for evaluation of lithium levels because he has had lithium toxicity in the past presents similar to this. He also reports word finding difficulty over the last 3-4 days that seems to be improving. He denies fevers or chills. No abdominal pain. One episode of diarrhea    Past Medical History:  Diagnosis Date  . Bipolar disorder (HCC)     There are no active problems to display for this patient.   Past Surgical History:  Procedure Laterality Date  . BACK SURGERY      Prior to Admission medications   Medication Sig Start Date End Date Taking? Authorizing Provider  meclizine (ANTIVERT) 25 MG tablet Take 1 tablet (25 mg total) by mouth 3 (three) times daily as needed for dizziness. 08/21/16   Jene Every, MD  ondansetron (ZOFRAN ODT) 4 MG disintegrating tablet Take 1 tablet (4 mg total) by mouth every 8 (eight) hours as needed for nausea or vomiting. 08/21/16   Jene Every, MD     Allergies Prednisone  No family history on file.  Social History Social History  Substance Use Topics  . Smoking status: Not on file  . Smokeless tobacco: Not on file  . Alcohol use Not on file    Review of Systems  Constitutional: No fever/chills Eyes: No visual changes.   Cardiovascular: Denies chest pain. Respiratory: Denies shortness of breath. Gastrointestinal: No abdominal pain. As above  Musculoskeletal: Negative for back pain. Skin: Negative for rash. Neurological: Negative for headaches   10-point ROS otherwise negative.  ____________________________________________   PHYSICAL EXAM:  VITAL SIGNS: ED Triage Vitals  Enc  Vitals Group     BP 08/21/16 1247 (!) 118/91     Pulse Rate 08/21/16 1247 (!) 51     Resp 08/21/16 1247 18     Temp 08/21/16 1247 97.5 F (36.4 C)     Temp Source 08/21/16 1247 Oral     SpO2 08/21/16 1247 99 %     Weight 08/21/16 1248 185 lb (83.9 kg)     Height 08/21/16 1248  (1.753 m)     Head Circumference --      Peak Flow --      Pain Score --      Pain Loc --      Pain Edu? --      Excl. in GC? --     Constitutional: Alert and oriented. No acute distress. Pleasant and interactive Eyes: Conjunctivae are normal.   Nose: No congestion/rhinnorhea. Mouth/Throat: Mucous membranes are moist.   Neck:  Painless ROM Cardiovascular: Normal rate, regular rhythm. Grossly normal heart sounds.  Good peripheral circulation. Respiratory: Normal respiratory effort.  No retractions. Lungs CTAB. Gastrointestinal: Soft and nontender. No distention.  No CVA tenderness.  Musculoskeletal:  Warm and well perfused Neurologic:  Normal speech and language. No gross focal neurologic deficits are appreciated. Cranial nerves II through XII normal Skin:  Skin is warm, dry and intact. No rash noted. Psychiatric: Mood and affect are normal. Speech and behavior are normal.  ____________________________________________   LABS (all labs ordered are listed, but only abnormal results are displayed)  Labs Reviewed  COMPREHENSIVE METABOLIC  PANEL - Abnormal; Notable for the following:       Result Value   Glucose, Bld 124 (*)    All other components within normal limits  LITHIUM LEVEL  CBC  TROPONIN I   ____________________________________________  EKG  ED ECG REPORT I, Jene Every, the attending physician, personally viewed and interpreted this ECG.  Date: 08/21/2016  Rhythm: normal sinus rhythm QRS Axis: normal Intervals: normal ST/T Wave abnormalities: normal Conduction Disturbances: none Narrative Interpretation:  unremarkable  ____________________________________________  RADIOLOGY  Ct head unremarkable ____________________________________________   PROCEDURES  Procedure(s) performed: No    Critical Care performed: No ____________________________________________   INITIAL IMPRESSION / ASSESSMENT AND PLAN / ED COURSE  Pertinent labs & imaging results that were available during my care of the patient were reviewed by me and considered in my medical decision making (see chart for details).  Patient well-appearing in no acute distress. Normal neurologic exam. CT head is unremarkable. Labs are benign. Treated with Zofran with significant improvement. Recommended MRI given patient's description of word finding difficulty but he refused and would like to be discharged and he will follow up with his PCP. This seems appropriate to me given no objective finding. ____________________________________________   FINAL CLINICAL IMPRESSION(S) / ED DIAGNOSES  Final diagnoses:  Acute gastritis without hemorrhage, unspecified gastritis type      NEW MEDICATIONS STARTED DURING THIS VISIT:  New Prescriptions   MECLIZINE (ANTIVERT) 25 MG TABLET    Take 1 tablet (25 mg total) by mouth 3 (three) times daily as needed for dizziness.   ONDANSETRON (ZOFRAN ODT) 4 MG DISINTEGRATING TABLET    Take 1 tablet (4 mg total) by mouth every 8 (eight) hours as needed for nausea or vomiting.     Note:  This document was prepared using Dragon voice recognition software and may include unintentional dictation errors.    Jene Every, MD 08/21/16 [phone]

## 2016-08-21 NOTE — ED Notes (Signed)
Pt reports diarrhea x1 episode today. Constant nausea, dry heaves, with some yellow bile emesis. Denies abd pain.

## 2016-08-21 NOTE — ED Provider Notes (Deleted)
[facility] Emergency Department Provider Note   ____________________________________________   I have reviewed the triage vital signs and the nursing notes.   HISTORY  Chief Complaint Emesis  Patient's mother present in room provided additional HPI.   HPI Albert [name] is a 51 y.o. male presents with a three-day history of dizziness, nausea, vomiting following what he describes as a upper respiratory infection. Patient states he was seen by his regular physician and received antibiotic regimen for treatment of upper respiratory symptoms noted onset of dizziness. He reported having his earwax removed however no improvement of dizziness. In addition, his mother, who is present in the room, reports several day history of intermittent word finding difficulty. Patient denies headache, fever, chills, abdominal pain or shortness of breath.   Past Medical History:  Diagnosis Date  . Bipolar disorder (HCC)     There are no active problems to display for this patient.   Past Surgical History:  Procedure Laterality Date  . BACK SURGERY      Prior to Admission medications   Not on File    Allergies Prednisone  No family history on file.  Social History Social History  Substance Use Topics  . Smoking status: Not on file  . Smokeless tobacco: Not on file  . Alcohol use Not on file    Review of Systems Constitutional: No fever/chills. Dizziness Eyes: No visual changes. ENT: No sore throat. Cardiovascular: Denies chest pain. Respiratory: Denies cough Gastrointestinal: No abdominal pain. Nausea and vomiting Skin: Negative for rash. Neurological: Negative for headaches  ____________________________________________   PHYSICAL EXAM:  VITAL SIGNS: ED Triage Vitals  Enc Vitals Group     BP 08/21/16 1247 (!) 118/91     Pulse Rate 08/21/16 1247 (!) 51     Resp 08/21/16 1247 18     Temp 08/21/16 1247 97.5 F (36.4 C)     Temp Source 08/21/16  1247 Oral     SpO2 08/21/16 1247 99 %     Weight 08/21/16 1248 185 lb (83.9 kg)     Height 08/21/16 1248  (1.753 m)     Head Circumference --      Peak Flow --      Pain Score --      Pain Loc --      Pain Edu? --      Excl. in GC? --     Constitutional: Alert and oriented. No acute distress, with eyes closed Eyes: Conjunctivae are normal. No nystagmus Cardiovascular: Normal rate, regular rhythm.  Good peripheral circulation. Respiratory: Normal respiratory effort. Genitourinary: deferred Neurologic:  Normal speech and language. No gross focal neurologic deficits are appreciated. Unsteady gait and balance secondary to dizziness. Word finding difficulty.  Skin:  Skin is warm, dry and intact. No rash noted. Psychiatric: Mood and affect are normal. Speech and behavior are normal.  ____________________________________________   LABS (all labs ordered are listed, but only abnormal results are displayed)  Labs Reviewed  COMPREHENSIVE METABOLIC PANEL - Abnormal; Notable for the following:       Result Value   Glucose, Bld 124 (*)    All other components within normal limits  LITHIUM LEVEL  CBC  TROPONIN I   ____________________________________________  EKG Orders placed or performed during the hospital encounter of 08/21/16  . ED EKG  . ED EKG  . EKG 12-Lead  . EKG 12-Lead   ____________________________________________  RADIOLOGY CT unremarkable ____________________________________________   PROCEDURES  Procedure(s) performed: no  Critical Care performed: no ____________________________________________   INITIAL IMPRESSION / ASSESSMENT AND PLAN / ED COURSE  Pertinent labs & imaging results that were available during my care of the patient were reviewed by me and considered in my medical decision making (see chart for details).  Unclear etiology of symptoms will obtain imaging and labs. Lithium level within normal range.Current course to treat dizziness  symptoms with zofran.       ____________________________________________   FINAL CLINICAL IMPRESSION(S) / ED DIAGNOSES  Final diagnoses:  None      NEW MEDICATIONS STARTED DURING THIS VISIT:  New Prescriptions   No medications on file     Note:  This document was prepared using Dragon voice recognition software and may include unintentional dictation errors.    Jordan Likes Antjuan Rothe, PA-C 08/21/16 1456

## 2017-12-20 ENCOUNTER — Ambulatory Visit: Payer: Self-pay | Admitting: Urology

## 2017-12-22 ENCOUNTER — Ambulatory Visit: Payer: Medicare Other | Admitting: Urology

## 2017-12-22 ENCOUNTER — Encounter: Payer: Self-pay | Admitting: Urology

## 2017-12-22 VITALS — BP 137/78 | HR 105 | Ht 69.0 in | Wt 184.4 lb

## 2017-12-22 DIAGNOSIS — N401 Enlarged prostate with lower urinary tract symptoms: Secondary | ICD-10-CM

## 2017-12-22 DIAGNOSIS — R3914 Feeling of incomplete bladder emptying: Secondary | ICD-10-CM

## 2017-12-22 DIAGNOSIS — N529 Male erectile dysfunction, unspecified: Secondary | ICD-10-CM | POA: Diagnosis not present

## 2017-12-22 LAB — MICROSCOPIC EXAMINATION
Bacteria, UA: NONE SEEN
EPITHELIAL CELLS (NON RENAL): NONE SEEN /HPF (ref 0–10)
WBC, UA: NONE SEEN /hpf (ref 0–5)

## 2017-12-22 LAB — URINALYSIS, COMPLETE
Bilirubin, UA: NEGATIVE
Glucose, UA: NEGATIVE
Ketones, UA: NEGATIVE
LEUKOCYTES UA: NEGATIVE
NITRITE UA: NEGATIVE
Protein, UA: NEGATIVE
Specific Gravity, UA: 1.015 (ref 1.005–1.030)
Urobilinogen, Ur: 0.2 mg/dL (ref 0.2–1.0)
pH, UA: 7 (ref 5.0–7.5)

## 2017-12-22 LAB — BLADDER SCAN AMB NON-IMAGING: Scan Result: 147

## 2017-12-22 MED ORDER — SILDENAFIL CITRATE 20 MG PO TABS: 20.0000 mg | ORAL_TABLET | ORAL | 11 refills | Status: DC | PRN

## 2017-12-22 MED ORDER — TAMSULOSIN HCL 0.4 MG PO CAPS: 0.4000 mg | ORAL_CAPSULE | Freq: Every day | ORAL | 11 refills | Status: DC

## 2017-12-22 NOTE — Patient Instructions (Signed)
Benign Prostatic Hyperplasia  Benign prostatic hyperplasia (BPH) is an enlarged prostate gland that is caused by the normal aging process and not by cancer. The prostate is a walnut-sized gland that is involved in the production of semen. It is located in front of the rectum and below the bladder. The bladder stores urine and the urethra is the tube that carries the urine out of the body. The prostate may get bigger as a man gets older.  An enlarged prostate can press on the urethra. This can make it harder to pass urine. The build-up of urine in the bladder can cause infection. Back pressure and infection may progress to bladder damage and kidney (renal) failure.  What are the causes?  This condition is part of a normal aging process. However, not all men develop problems from this condition. If the prostate enlarges away from the urethra, urine flow will not be blocked. If it enlarges toward the urethra and compresses it, there will be problems passing urine.  What increases the risk?  This condition is more likely to develop in men over the age of 50 years.  What are the signs or symptoms?  Symptoms of this condition include:  · Getting up often during the night to urinate.  · Needing to urinate frequently during the day.  · Difficulty starting urine flow.  · Decrease in size and strength of your urine stream.  · Leaking (dribbling) after urinating.  · Inability to pass urine. This needs immediate treatment.  · Inability to completely empty your bladder.  · Pain when you pass urine. This is more common if there is also an infection.  · Urinary tract infection (UTI).    How is this diagnosed?  This condition is diagnosed based on your medical history, a physical exam, and your symptoms. Tests will also be done, such as:  · A post-void bladder scan. This measures any amount of urine that may remain in your bladder after you finish urinating.  · A digital rectal exam. In a rectal exam, your health care provider  checks your prostate by putting a lubricated, gloved finger into your rectum to feel the back of your prostate gland. This exam detects the size of your gland and any abnormal lumps or growths.  · An exam of your urine (urinalysis).  · A prostate specific antigen (PSA) screening. This is a blood test used to screen for prostate cancer.  · An ultrasound. This test uses sound waves to electronically produce a picture of your prostate gland.    Your health care provider may refer you to a specialist in kidney and prostate diseases (urologist).  How is this treated?  Once symptoms begin, your health care provider will monitor your condition (active surveillance or watchful waiting). Treatment for this condition will depend on the severity of your condition. Treatment may include:  · Observation and yearly exams. This may be the only treatment needed if your condition and symptoms are mild.  · Medicines to relieve your symptoms, including:  ? Medicines to shrink the prostate.  ? Medicines to relax the muscle of the prostate.  · Surgery in severe cases. Surgery may include:  ? Prostatectomy. In this procedure, the prostate tissue is removed completely through an open incision or with a laparascope or robotics.  ? Transurethral resection of the prostate (TURP). In this procedure, a tool is inserted through the opening at the tip of the penis (urethra). It is used to cut away tissue of   the inner core of the prostate. The pieces are removed through the same opening of the penis. This removes the blockage.  ? Transurethral incision (TUIP). In this procedure, small cuts are made in the prostate. This lessens the prostate's pressure on the urethra.  ? Transurethral microwave thermotherapy (TUMT). This procedure uses microwaves to create heat. The heat destroys and removes a small amount of prostate tissue.  ? Transurethral needle ablation (TUNA). This procedure uses radio frequencies to destroy and remove a small amount of  prostate tissue.  ? Interstitial laser coagulation (ILC). This procedure uses a laser to destroy and remove a small amount of prostate tissue.  ? Transurethral electrovaporization (TUVP). This procedure uses electrodes to destroy and remove a small amount of prostate tissue.  ? Prostatic urethral lift. This procedure inserts an implant to push the lobes of the prostate away from the urethra.    Follow these instructions at home:  · Take over-the-counter and prescription medicines only as told by your health care provider.  · Monitor your symptoms for any changes. Contact your health care provider with any changes.  · Avoid drinking large amounts of liquid before going to bed or out in public.  · Avoid or reduce how much caffeine or alcohol you drink.  · Give yourself time when you urinate.  · Keep all follow-up visits as told by your health care provider. This is important.  Contact a health care provider if:  · You have unexplained back pain.  · Your symptoms do not get better with treatment.  · You develop side effects from the medicine you are taking.  · Your urine becomes very dark or has a bad smell.  · Your lower abdomen becomes distended and you have trouble passing your urine.  Get help right away if:  · You have a fever or chills.  · You suddenly cannot urinate.  · You feel lightheaded, or very dizzy, or you faint.  · There are large amounts of blood or clots in the urine.  · Your urinary problems become hard to manage.  · You develop moderate to severe low back or flank pain. The flank is the side of your body between the ribs and the hip.  These symptoms may represent a serious problem that is an emergency. Do not wait to see if the symptoms will go away. Get medical help right away. Call your local emergency services (911 in the [place] Do not drive yourself to the hospital.  Summary  · Benign prostatic hyperplasia (BPH) is an enlarged prostate that is caused by the normal aging process and not by  cancer.  · An enlarged prostate can press on the urethra. This can make it hard to pass urine.  · This condition is part of a normal aging process and is more likely to develop in men over the age of 50 years.  · Get help right away if you suddenly cannot urinate.  This information is not intended to replace advice given to you by your health care provider. Make sure you discuss any questions you have with your health care provider.  Document Released: 04/19/2005 Document Revised: 05/24/2016 Document Reviewed: 05/24/2016  Elsevier Interactive Patient Education © 2018 [employer]

## 2017-12-22 NOTE — Progress Notes (Signed)
12/22/2017 4:32 PM   Albert [name] [birth year] [id number]  Referring provider: Kandyce [name], Marcus, MD [phone] [name] [place] [facility] [place], [place] [id number]  CC: LUTS, ED  HPI: The pleasure of seeing Mr. loss today in urology clinic for lower urinary tract symptoms and erectile dysfunction.  Briefly he is a healthy 52 year old male with history of bipolar disorder and chronic lower urinary symptoms well managed on Flomax.  So has erectile dysfunction that is well managed with sildenafil.  He is a prior patient of Dr. Achilles [name].  He denies any complaints since we saw him last, he specifically denies flank pain, gross hematuria, retention.  His IPSS score is 18 today, however his quality of life due to urinary symptoms is pleased.  There are no aggravating or alleviating factors.  The severity is mild.  The duration is many years.  PSA has been low, and was last checked 2 years prior and was 0.5.  PVR in clinic today 145 cc, however he states he stopped his flow after giving a urine sample.  He reportedly has undergone multiple hematuria work-ups prior.  The last CT urogram I am able to find is December 2012 which was negative   PMH: Past Medical History:  Diagnosis Date  . Anxiety   . Bipolar disorder (HCC)   . Depression   . GERD (gastroesophageal reflux disease)     Surgical History: Past Surgical History:  Procedure Laterality Date  . BACK SURGERY    . EYE SURGERY    . HERNIA REPAIR      Allergies:  Allergies  Allergen Reactions  . Meloxicam Other (See Comments)    Other reaction(s): Dizziness  . Prednisone     Family History: Family History  Problem Relation Age of Onset  . Prostate cancer Neg Hx   . Bladder Cancer Neg Hx   . Kidney cancer Neg Hx     Social History:  reports that he has quit smoking. He has never used smokeless tobacco. He reports that he drank alcohol. He reports that he does not use drugs.  ROS: Please see flowsheet  from today's date for complete review of systems.  Physical Exam: BP 137/78 (BP Location: Left Arm, Patient Position: Sitting, Cuff Size: Normal)   Pulse (!) 105   Ht 5\' 9"  (1.753 m)   Wt 184 lb 6.4 oz (83.6 kg)   BMI 27.23 kg/m    Constitutional:  Alert and oriented, No acute distress. Cardiovascular: No clubbing, cyanosis, or edema. Respiratory: Normal respiratory effort, no increased work of breathing. GI: Abdomen is soft, nontender, nondistended, no abdominal masses Lymph: No cervical or inguinal lymphadenopathy. Skin: No rashes, bruises or suspicious lesions. Neurologic: Grossly intact, no focal deficits, moving all 4 extremities. Psychiatric: Normal mood and affect.  Laboratory Data: Urinalysis today notable for 3-10 RBCs  Pertinent Imaging: CTU for hematuria 04/2011 negative  Assessment & Plan:   In summary, Mr. Level is a healthy 52 year old man we are following for lower urinary symptoms well managed on Flomax, and erectile dysfunction well managed on sildenafil.  We discussed surgical options for BPH if his urinary symptoms were to worsen, and he is not interested in any other therapy or procedure at this time.  I also discussed at length with him his microscopic hematuria, and he adamantly refused further work-up including CT urogram or cystoscopy.  PSA was drawn today.  He will follow-up on a yearly basis for the above complaints.  Return  in about 1 year (around 12/23/2018) for IPSS, PVR, clinic visit.  Sondra Come, MD  [facility] [address] [place], [place] [address] [phone]

## 2017-12-23 ENCOUNTER — Other Ambulatory Visit: Payer: Self-pay | Admitting: Urology

## 2017-12-23 ENCOUNTER — Telehealth: Payer: Self-pay

## 2017-12-23 DIAGNOSIS — N401 Enlarged prostate with lower urinary tract symptoms: Secondary | ICD-10-CM

## 2017-12-23 DIAGNOSIS — R3914 Feeling of incomplete bladder emptying: Principal | ICD-10-CM

## 2017-12-23 LAB — PSA: Prostate Specific Ag, Serum: 0.7 ng/mL (ref 0.0–4.0)

## 2017-12-23 MED ORDER — SILDENAFIL CITRATE 20 MG PO TABS: 60.0000 mg | ORAL_TABLET | ORAL | 11 refills | Status: DC | PRN

## 2017-12-23 NOTE — Telephone Encounter (Signed)
-----   Message from Sondra ComeBrian C Sninsky, MD sent at 12/23/2017  8:37 AM EDT ----- Regarding: PSA normal Please let him know PSA was normal at 0.7, keep follow up in one year.   Thanks  Sondra ComeBrian C Sninsky, MD 12/23/2017

## 2017-12-23 NOTE — Telephone Encounter (Signed)
Left message for pt to call office

## 2017-12-26 NOTE — Telephone Encounter (Signed)
Patient returned call and PSA results provided.  Patient voiced understanding and will follow up at appointment scheduled in 1 year.

## 2018-02-06 ENCOUNTER — Other Ambulatory Visit: Payer: Self-pay | Admitting: Psychiatry

## 2018-02-10 DIAGNOSIS — F411 Generalized anxiety disorder: Secondary | ICD-10-CM | POA: Insufficient documentation

## 2018-02-10 DIAGNOSIS — Z79899 Other long term (current) drug therapy: Secondary | ICD-10-CM | POA: Insufficient documentation

## 2018-02-10 DIAGNOSIS — F314 Bipolar disorder, current episode depressed, severe, without psychotic features: Secondary | ICD-10-CM | POA: Insufficient documentation

## 2018-02-10 DIAGNOSIS — Z7969 Long term (current) use of other immunomodulators and immunosuppressants: Secondary | ICD-10-CM | POA: Insufficient documentation

## 2018-02-21 ENCOUNTER — Ambulatory Visit: Payer: Medicare Other | Admitting: Psychiatry

## 2018-02-21 ENCOUNTER — Encounter: Payer: Self-pay | Admitting: Psychiatry

## 2018-02-21 DIAGNOSIS — F4001 Agoraphobia with panic disorder: Secondary | ICD-10-CM | POA: Diagnosis not present

## 2018-02-21 DIAGNOSIS — F3162 Bipolar disorder, current episode mixed, moderate: Secondary | ICD-10-CM | POA: Diagnosis not present

## 2018-02-21 DIAGNOSIS — F411 Generalized anxiety disorder: Secondary | ICD-10-CM | POA: Diagnosis not present

## 2018-02-21 NOTE — Progress Notes (Signed)
[facility]  Patient ID: Albert [name],  MRN: [id number]  PCP: Kandyce Rud, MD  Date of Evaluation: 02/21/2018 Time spent:30 minutes   HISTORY/CURRENT STATUS: HPI CC: anx and mood  Still feels need for anxiety and need for Xanax.  Sleep is fair.  Still intense mood.  Asked questions about Genesight.  We did this in the past and it was not that useful at directing med changes.  Mixed moods swings always but not severe. Sleep at least 6 hours.  OK wwith meds overall but wishes he'd be less depressed. Still grieving relationship. Dep 6/10.  Anxiety similar.  No severe mania.  Occ agitation per usual.  Psych med hx extensive including ECT and risperidone, N-acetylcysteine, Nuedexta, Latuda 80 which caused akathisia, Vraylar, Rexulti, Bell summer with no response, Viibryd 40 mg for 3 months with diarrhea, protriptyline with side effects, clonidine, Trintellix 20 mg, Parnate 50 mg with no response, gabapentin, imipramine, Lunesta no response, venlafaxine, Tegretol, and Vega, Emsam 12 mg for 2 months, trazodone 200 mg, aripiprazole 20 mg with akathisia, methylphenidate 60 mg, bupropion was side effects, symbyax, Seroquel thousand milligrams, Strattera, Geodon, lamotrigine 300 mg, Lexapro 20 mg, Depakote 2000 mg, modafinil, sertraline, paroxetine, Deplin, pramipexole, Vyvanse, amantadine, Saphris with side effects, Trileptal and several of these in combinations  Individual Medical History/ Review of Systems: Changes? 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 And again.  Allergies: Meloxicam; Prednisone; and Wellbutrin [bupropion]  Current Medications:  Current Outpatient Medications:  .  ALPRAZolam (XANAX) 1 MG tablet, Take 1 mg by mouth every 8 (eight) hours as needed for anxiety. , Disp: , Rfl:  .  esomeprazole (NEXIUM) 20 MG capsule, Take by mouth., Disp: , Rfl:  .  fluticasone (FLONASE) 50 MCG/ACT nasal spray, , Disp: , Rfl:  .  levothyroxine (SYNTHROID, LEVOTHROID) 75 MCG tablet,  Take by mouth., Disp: , Rfl:  .  lithium carbonate 300 MG capsule, TAKE 3 CAPSULES BY MOUTH EVERY EVENING, Disp: 90 capsule, Rfl: 0 .  OLANZapine (ZYPREXA) 10 MG tablet, TAKE 1 TABLET BY MOUTH AT BEDTIME, Disp: 30 tablet, Rfl: 0 .  QUEtiapine (SEROQUEL) 400 MG tablet, Take 400 mg by mouth at bedtime. Take 2 (800 mg) at bedtime, Disp: , Rfl:  .  sildenafil (REVATIO) 20 MG tablet, Take 3-5 tablets (60-100 mg total) by mouth as needed., Disp: 30 tablet, Rfl: 11 .  tamsulosin (FLOMAX) 0.4 MG CAPS capsule, Take 1 capsule (0.4 mg total) by mouth daily after supper., Disp: 30 capsule, Rfl: 11 .  meclizine (ANTIVERT) 25 MG tablet, Take 1 tablet (25 mg total) by mouth 3 (three) times daily as needed for dizziness. (Patient not taking: Reported on 02/21/2018), Disp: 20 tablet, Rfl: 0 .  ondansetron (ZOFRAN ODT) 4 MG disintegrating tablet, Take 1 tablet (4 mg total) by mouth every 8 (eight) hours as needed for nausea or vomiting., Disp: 20 tablet, Rfl: 0 Medication Side Effects: None  Family Medical/ Social History: Changes? Yes Breakup with GF seemed to calm him down some.  Wants relationship but it didn't work over 5 years.  MENTAL HEALTH EXAM:  There were no vitals taken for this visit.There is no height or weight on file to calculate BMI.  General Appearance: Casual  Eye Contact:  Good  Speech:  Pressured per usual.  Volume:  Increased  Mood:  Dysphoric; and worry  Affect:  intense  Thought Process:  Disorganized and Goal Directed  Orientation:  Full (Time, Place, and Person)  Thought Content: Logical  Suicidal Thoughts:  No  Homicidal Thoughts:  No  Memory:  Recent  Judgement:  Fair  Insight:  Fair  Psychomotor Activity:  Increased  Concentration:  Concentration: Fair  Recall:  Good  Fund of Knowledge: Good  Language: Good  Akathisia:  No  AIMS (if indicated): not done  Assets:  Desire for Improvement Financial Resources/Insurance Housing Leisure Time Transportation  ADL's:   Intact  Cognition: WNL  Prognosis:  Fair   Lab Results  Component Value Date   LITHIUM 1.00 08/21/2016   NA 137 08/21/2016   BUN 8 08/21/2016   CREATININE 1.01 08/21/2016   TSH < 0.010 (L) 03/18/2014   WBC 9.2 08/21/2016   Li 0.7 last in September and 8/29 with nl CMP. At current dose.  DIAGNOSES:    ICD-10-CM   1. Bipolar 1 disorder, mixed, moderate (HCC) F31.62   2. Generalized anxiety disorder F41.1   3. Panic disorder with agoraphobia F40.01     RECOMMENDATIONS:  Greater than 50% of face to face time with patient was spent on counseling and coordination of care. We discussed chronic TR mixed sx is probably as good as we have been able to achieve. Disc Bz withdrawal which he's experienced at times. Disc how to get over broken relationship. Counseled patient regarding potential benefits, risks, and side effects of lithium to include potential risk of lithium affecting thyroid and renal function.  Discussed need for periodic lab monitoring to determine drug level and to assess for potential adverse effects.  Counseled patient regarding signs and symptoms of lithium toxicity and advised that they notify office immediately or seek urgent medical attention if experiencing these signs and symptoms.  Patient advised to contact office with any questions or concerns. Hx lithium toxicity. Discussed potential metabolic side effects associated with atypical antipsychotics, as well as potential risk for movement side effects. And unusual combination necessary for him to sleep and for a degree of stability. FU 8 weeks DT chronic instability.  Lauraine Rinne, MD

## 2018-03-07 ENCOUNTER — Other Ambulatory Visit: Payer: Self-pay | Admitting: Psychiatry

## 2018-03-07 DIAGNOSIS — R3914 Feeling of incomplete bladder emptying: Principal | ICD-10-CM

## 2018-03-07 DIAGNOSIS — N401 Enlarged prostate with lower urinary tract symptoms: Secondary | ICD-10-CM

## 2018-04-24 ENCOUNTER — Encounter: Payer: Self-pay | Admitting: Psychiatry

## 2018-04-24 ENCOUNTER — Ambulatory Visit: Payer: Medicare Other | Admitting: Psychiatry

## 2018-04-24 DIAGNOSIS — F411 Generalized anxiety disorder: Secondary | ICD-10-CM

## 2018-04-24 DIAGNOSIS — N401 Enlarged prostate with lower urinary tract symptoms: Secondary | ICD-10-CM

## 2018-04-24 DIAGNOSIS — F3162 Bipolar disorder, current episode mixed, moderate: Secondary | ICD-10-CM

## 2018-04-24 DIAGNOSIS — F4001 Agoraphobia with panic disorder: Secondary | ICD-10-CM | POA: Diagnosis not present

## 2018-04-24 DIAGNOSIS — R3914 Feeling of incomplete bladder emptying: Secondary | ICD-10-CM

## 2018-04-24 MED ORDER — ALPRAZOLAM 1 MG PO TABS: 1.0000 mg | ORAL_TABLET | Freq: Four times a day (QID) | ORAL | 2 refills | Status: DC | PRN

## 2018-04-24 NOTE — Progress Notes (Signed)
Albert [name] [id number] 1965/12/18 52 y.o.  Subjective:   Patient ID:  Albert [name] is a 52 y.o. (DOB 1965/12/18) male.  Chief Complaint:  Chief Complaint  Patient presents with  . Anxiety  . Depression    HPI Albert [name] presents to the office today for follow-up of mixed bipolar, anxiety and still grieving stress of breakup.  More grief over last GF partly re: xmas.  Asks about how long it should take..  Still episodic, brief SI even without pcpt.  Always spends a lot of time just laying around.  Anxiety is worse than usual.  Occ rumination and rapid thoughts over exGF.  Can have anxiety for no reason but believes it's grief over rel breakup in August.  Sleep ok.  Some chronic depression and hyperactivity and loudness and hyperverbal.  Usually back to sleep.  Total 6 hours but some napping.  Teeth fixed for $9000.  Satisfied.  Feels more confident.  F still works at [employer]71yo.  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  Psych med hx extensive including ECT and risperidone, N-acetylcysteine, Nuedexta, Latuda 80 which caused akathisia, Vraylar, Rexulti, Bell summer with no response, Viibryd 40 mg for 3 months with diarrhea, protriptyline with side effects, clonidine, Trintellix 20 mg, Parnate 50 mg with no response, gabapentin, imipramine, Lunesta no response, venlafaxine, Tegretol, and Vega, Emsam 12 mg for 2 months, trazodone 200 mg, aripiprazole 20 mg with akathisia, methylphenidate 60 mg, bupropion was side effects, symbyax, Seroquel thousand milligrams, Strattera, Geodon, lamotrigine 300 mg, Lexapro 20 mg, Depakote 2000 mg, modafinil, sertraline, paroxetine, Deplin, pramipexole, Vyvanse, amantadine, Saphris with side effects, Trileptal and several of these in combinations  Review of Systems:  Review of Systems  Neurological: Positive for tremors. Negative for weakness.       Fidgety  Psychiatric/Behavioral: Positive for decreased concentration and suicidal ideas. Negative for agitation,  behavioral problems, confusion, dysphoric mood, hallucinations, self-injury and sleep disturbance. The patient is nervous/anxious and is hyperactive.     Medications: I have reviewed the patient's current medications.  Current Outpatient Medications  Medication Sig Dispense Refill  . ALPRAZolam (XANAX) 1 MG tablet Take 1 tablet (1 mg total) by mouth every 6 (six) hours as needed. 100 tablet 2  . esomeprazole (NEXIUM) 20 MG capsule Take by mouth.    . fluticasone (FLONASE) 50 MCG/ACT nasal spray     . levothyroxine (SYNTHROID, LEVOTHROID) 75 MCG tablet Take by mouth.    . lithium carbonate 300 MG capsule TAKE 3 CAPSULES BY MOUTH EVERY EVENING 90 capsule 2  . meclizine (ANTIVERT) 25 MG tablet Take 1 tablet (25 mg total) by mouth 3 (three) times daily as needed for dizziness. 20 tablet 0  . OLANZapine (ZYPREXA) 10 MG tablet TAKE ONE TABLET BY MOUTH AT BEDTIME 30 tablet 2  . ondansetron (ZOFRAN ODT) 4 MG disintegrating tablet Take 1 tablet (4 mg total) by mouth every 8 (eight) hours as needed for nausea or vomiting. 20 tablet 0  . QUEtiapine (SEROQUEL) 400 MG tablet TAKE TWO TABLETS EACH NIGHT AT BEDTIME 60 tablet 2  . sildenafil (REVATIO) 20 MG tablet Take 3-5 tablets (60-100 mg total) by mouth as needed. 30 tablet 11  . tamsulosin (FLOMAX) 0.4 MG CAPS capsule Take 1 capsule (0.4 mg total) by mouth daily after supper. 30 capsule 11   No current facility-administered medications for this visit.    Taking Xanax at 9, 2pm and 7pm  Medication Side Effects: Other: mild sleepiness.  Allergies:  Allergies  Allergen Reactions  . Meloxicam Other (See Comments)    Other reaction(s): Dizziness  . Prednisone   . Wellbutrin [Bupropion]     Past Medical History:  Diagnosis Date  . Anxiety   . Bipolar disorder (HCC)   . Depression   . GERD (gastroesophageal reflux disease)     Family History  Problem Relation Age of Onset  . Prostate cancer Neg Hx   . Bladder Cancer Neg Hx   . Kidney  cancer Neg Hx     Social History   Socioeconomic History  . Marital status: Single    Spouse name: Not on file  . Number of children: Not on file  . Years of education: Not on file  . Highest education level: Not on file  Occupational History  . Not on file  Social Needs  . Financial resource strain: Not on file  . Food insecurity:    Worry: Not on file    Inability: Not on file  . Transportation needs:    Medical: Not on file    Non-medical: Not on file  Tobacco Use  . Smoking status: Former Games developer  . Smokeless tobacco: Never Used  Substance and Sexual Activity  . Alcohol use: Not Currently  . Drug use: Never  . Sexual activity: Not on file  Lifestyle  . Physical activity:    Days per week: Not on file    Minutes per session: Not on file  . Stress: Not on file  Relationships  . Social connections:    Talks on phone: Not on file    Gets together: Not on file    Attends religious service: Not on file    Active member of club or organization: Not on file    Attends meetings of clubs or organizations: Not on file    Relationship status: Not on file  . Intimate partner violence:    Fear of current or ex partner: Not on file    Emotionally abused: Not on file    Physically abused: Not on file    Forced sexual activity: Not on file  Other Topics Concern  . Not on file  Social History Narrative  . Not on file    Past Medical History, Surgical history, Social history, and Family history were reviewed and updated as appropriate.   Please see review of systems for further details on the patient's review from today.   Objective:   Physical Exam:  There were no vitals taken for this visit.  Physical Exam Constitutional:      Appearance: He is normal weight.  Neurological:     Mental Status: He is alert.     Motor: Tremor present.     Gait: Gait normal.     Comments: fidgety  Psychiatric:        Attention and Perception: Attention and perception normal.         Mood and Affect: Mood is anxious and depressed.        Speech: Speech is rapid and pressured.        Behavior: Behavior is hyperactive. Behavior is not agitated or aggressive.        Thought Content: Thought content is not paranoid. Thought content does not include homicidal or suicidal ideation.        Cognition and Memory: Cognition normal.     Comments: Insight and judgment fair. Chronically hyper style. Directable. Easily distractible. More fidgety than usual.     Lab Review:  Component Value Date/Time   NA 137 08/21/2016 1303   NA 142 03/20/2014 0514   K 3.5 08/21/2016 1303   K 4.1 03/20/2014 0514   CL 105 08/21/2016 1303   CL 112 (H) 03/20/2014 0514   CO2 23 08/21/2016 1303   CO2 26 03/20/2014 0514   GLUCOSE 124 (H) 08/21/2016 1303   GLUCOSE 99 03/20/2014 0514   BUN 8 08/21/2016 1303   BUN 7 03/20/2014 0514   CREATININE 1.01 08/21/2016 1303   CREATININE 0.78 03/20/2014 0514   CALCIUM 9.8 08/21/2016 1303   CALCIUM 9.5 03/20/2014 0514   PROT 7.8 08/21/2016 1303   PROT 7.3 03/18/2014 1459   ALBUMIN 4.6 08/21/2016 1303   ALBUMIN 3.2 (L) 03/18/2014 1459   AST 24 08/21/2016 1303   AST 16 03/18/2014 1459   ALT 29 08/21/2016 1303   ALT 16 03/18/2014 1459   ALKPHOS 63 08/21/2016 1303   ALKPHOS 105 03/18/2014 1459   BILITOT 1.0 08/21/2016 1303   BILITOT 0.3 03/18/2014 1459   GFRNONAA >60 08/21/2016 1303   GFRNONAA >60 03/20/2014 0514   GFRAA >60 08/21/2016 1303   GFRAA >60 03/20/2014 0514       Component Value Date/Time   WBC 9.2 08/21/2016 1303   RBC 4.67 08/21/2016 1303   HGB 15.1 08/21/2016 1303   HGB 12.6 (L) 03/20/2014 0514   HCT 43.9 08/21/2016 1303   HCT 38.4 (L) 03/20/2014 0514   PLT 413 08/21/2016 1303   PLT 346 03/20/2014 0514   MCV 94.1 08/21/2016 1303   MCV 89 03/20/2014 0514   MCH 32.3 08/21/2016 1303   MCHC 34.4 08/21/2016 1303   RDW 13.2 08/21/2016 1303   RDW 12.5 03/20/2014 0514   LYMPHSABS 3.2 03/20/2014 0514   MONOABS 1.0 03/20/2014  0514   EOSABS 0.4 03/20/2014 0514   BASOSABS 0.1 03/20/2014 0514    Lithium Lvl  Date Value Ref Range Status  08/21/2016 1.00 0.60 - 1.20 mmol/L Final    Last lithium level Sept 0.7.  Recent lipids ok except higher TG than usual.  Normal A1C.  No results found for: PHENYTOIN, PHENOBARB, VALPROATE, CBMZ   .res Assessment: Plan:    Bipolar 1 disorder, mixed, moderate (HCC)  Generalized anxiety disorder  Panic disorder with agoraphobia  Benign prostatic hyperplasia with incomplete bladder emptying - Plan: ALPRAZolam (XANAX) 1 MG tablet   Chronic TR bipolar mixed and chronic anxiety.  He still has mixed bipolar symptoms which we have not been able to completely eliminate.  He is probably at close to his optimal baseline but is still bothered by symptoms especially anxiety.  See long list of meds tried.  Limits other options.  We discussed the short-term risks associated with benzodiazepines including sedation and increased fall risk among others.  Discussed long-term side effect risk including dependence, potential withdrawal symptoms, and the potential eventual dose-related risk of dementia.  Disc prior panic and ways to reduce the meds and prior withdrawal sx.  Lithium level stable and doesn't tolerate nor benefit from higher doses.  Counseled patient regarding potential benefits, risks, and side effects of lithium to include potential risk of lithium affecting thyroid and renal function.  Discussed need for periodic lab monitoring to determine drug level and to assess for potential adverse effects.  Counseled patient regarding signs and symptoms of lithium toxicity and advised that they notify office immediately or seek urgent medical attention if experiencing these signs and symptoms.  Patient advised to contact office with any questions or concerns.  Sleep hygiene.  Increase activity.  Bored.  Disc poss of RLS,akathisia from addition of Zyprexa but we both agree the fidgetiness is  anxiety and not SE.  Disc unusual combo antipsychotics but it helps more than other options.  Discussed potential metabolic side effects associated with atypical antipsychotics, as well as potential risk for movement side effects. Advised pt to contact office if movement side effects occur.  No indication for dosage changes.  This appt was 30 mins.  FU 8 weeks.  Meredith Staggers, MD, DFAPA   Please see After Visit Summary for patient specific instructions.  Future Appointments  Date Time Provider Department Center  12/25/2018 10:45 AM Sondra Come, MD [facility] None    No orders of the defined types were placed in this encounter.     -------------------------------

## 2018-06-01 ENCOUNTER — Other Ambulatory Visit: Payer: Self-pay | Admitting: Psychiatry

## 2018-06-01 DIAGNOSIS — R3914 Feeling of incomplete bladder emptying: Principal | ICD-10-CM

## 2018-06-01 DIAGNOSIS — N401 Enlarged prostate with lower urinary tract symptoms: Secondary | ICD-10-CM

## 2018-06-28 ENCOUNTER — Encounter: Payer: Self-pay | Admitting: Psychiatry

## 2018-06-28 ENCOUNTER — Ambulatory Visit: Payer: Medicare Other | Admitting: Psychiatry

## 2018-06-28 DIAGNOSIS — F4001 Agoraphobia with panic disorder: Secondary | ICD-10-CM | POA: Diagnosis not present

## 2018-06-28 DIAGNOSIS — F3162 Bipolar disorder, current episode mixed, moderate: Secondary | ICD-10-CM

## 2018-06-28 DIAGNOSIS — F411 Generalized anxiety disorder: Secondary | ICD-10-CM | POA: Diagnosis not present

## 2018-06-28 NOTE — Progress Notes (Signed)
Albert [name] [id number] 25-Nov-1965 53 y.o.  Subjective:   Patient ID:  Albert [name] is a 53 y.o. (DOB 01/17/1966) male.  Chief Complaint:  Chief Complaint  Patient presents with  . Follow-up    Medication Management   Last seen December 23,2019 HPI Albert [name] presents to the office today for follow-up of mixed bipolar, anxiety and still grieving stress of breakup.  Asked questions about Xanax.  Needs more Xanax to control the anxiety since the breakup.  Ruminates on ex GF and may computer search her.  Fleeting SI.  Xanax helps to calm him.  Thinks he's gotten tolerant.  Still episodic, brief SI even without pcpt.  Always spends a lot of time just laying around.  Anxiety is worse than usual.  Occ rumination and rapid thoughts over exGF who got in another relationship and moved to [facility].  Can have anxiety for no reason but believes it's grief over rel breakup in August.  Sleep ok.  Some chronic depression and hyperactivity and loudness and hyperverbal.  Usually back to sleep.  Total 6 hours but some napping.  Still depressed and anxious too.  Still lacks interest and motivation.  F still works at [employer].  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  Psych med hx extensive including ECT and risperidone, lithium 1200 SE, Zyprexa 20-15 akathisia, Latuda 80 which caused akathisia, Abilify, Vraylar, Rexulti, aripiprazole 20 mg with akathisia,Seroquel 1000 mg,  InVega, Geodon, lamotrigine 300 mg, Depakote 2000 mg, Saphris with side effects, symbyax,Tegretol,Trileptal and several of these in combinations .  N-acetylcysteine, Nuedexta,  Belsomra with no response, Viibryd 40 mg for 3 months with diarrhea, protriptyline with side effects, clonidine, Trintellix 20 mg, Parnate 50 mg with no response, gabapentin, imipramine, Lunesta no response, venlafaxine,  Emsam 12 mg for 2 months, trazodone 200 mg,  methylphenidate 60 mg, bupropion was side effects,  Strattera,  Lexapro 20 mg, , modafinil, sertraline, paroxetine,  Deplin, pramipexole, Vyvanse, Concerta, amantadine ,Patient prone to akathisia.  Review of Systems:  Review of Systems  Neurological: Positive for tremors. Negative for weakness.       Fidgety  Psychiatric/Behavioral: Positive for decreased concentration and suicidal ideas. Negative for agitation, behavioral problems, confusion, dysphoric mood, hallucinations, self-injury and sleep disturbance. The patient is nervous/anxious and is hyperactive.     Medications: I have reviewed the patient's current medications.  Current Outpatient Medications  Medication Sig Dispense Refill  . ALPRAZolam (XANAX) 1 MG tablet Take 1 tablet (1 mg total) by mouth every 6 (six) hours as needed. 100 tablet 2  . esomeprazole (NEXIUM) 20 MG capsule Take by mouth.    . fluticasone (FLONASE) 50 MCG/ACT nasal spray     . levothyroxine (SYNTHROID, LEVOTHROID) 75 MCG tablet Take by mouth.    . lithium carbonate 300 MG capsule TAKE 3 CAPSULES BY MOUTH EVERY EVENING 90 capsule 2  . meclizine (ANTIVERT) 25 MG tablet Take 1 tablet (25 mg total) by mouth 3 (three) times daily as needed for dizziness. 20 tablet 0  . OLANZapine (ZYPREXA) 10 MG tablet TAKE ONE TABLET BY MOUTH AT BEDTIME 30 tablet 2  . ondansetron (ZOFRAN ODT) 4 MG disintegrating tablet Take 1 tablet (4 mg total) by mouth every 8 (eight) hours as needed for nausea or vomiting. 20 tablet 0  . QUEtiapine (SEROQUEL) 400 MG tablet TAKE TWO TABLETS BY MOUTH AT BEDTIME 60 tablet 2  . sildenafil (REVATIO) 20 MG tablet Take 3-5 tablets (60-100 mg total) by mouth as needed. 30  tablet 11  . tamsulosin (FLOMAX) 0.4 MG CAPS capsule Take 1 capsule (0.4 mg total) by mouth daily after supper. 30 capsule 11   No current facility-administered medications for this visit.    Taking Xanax at 9, 2pm and 7pm  Medication Side Effects: Other: mild sleepiness.  Allergies:  Allergies  Allergen Reactions  . Meloxicam Other (See Comments)    Other reaction(s): Dizziness  .  Prednisone   . Wellbutrin [Bupropion]     Past Medical History:  Diagnosis Date  . Anxiety   . Bipolar disorder (HCC)   . Depression   . GERD (gastroesophageal reflux disease)     Family History  Problem Relation Age of Onset  . Prostate cancer Neg Hx   . Bladder Cancer Neg Hx   . Kidney cancer Neg Hx     Social History   Socioeconomic History  . Marital status: Single    Spouse name: Not on file  . Number of children: Not on file  . Years of education: Not on file  . Highest education level: Not on file  Occupational History  . Not on file  Social Needs  . Financial resource strain: Not on file  . Food insecurity:    Worry: Not on file    Inability: Not on file  . Transportation needs:    Medical: Not on file    Non-medical: Not on file  Tobacco Use  . Smoking status: Former Games developer  . Smokeless tobacco: Never Used  Substance and Sexual Activity  . Alcohol use: Not Currently  . Drug use: Never  . Sexual activity: Not on file  Lifestyle  . Physical activity:    Days per week: Not on file    Minutes per session: Not on file  . Stress: Not on file  Relationships  . Social connections:    Talks on phone: Not on file    Gets together: Not on file    Attends religious service: Not on file    Active member of club or organization: Not on file    Attends meetings of clubs or organizations: Not on file    Relationship status: Not on file  . Intimate partner violence:    Fear of current or ex partner: Not on file    Emotionally abused: Not on file    Physically abused: Not on file    Forced sexual activity: Not on file  Other Topics Concern  . Not on file  Social History Narrative  . Not on file    Past Medical History, Surgical history, Social history, and Family history were reviewed and updated as appropriate.   Please see review of systems for further details on the patient's review from today.   Objective:   Physical Exam:  There were no vitals  taken for this visit.  Physical Exam Constitutional:      Appearance: He is normal weight.  Neurological:     Mental Status: He is alert.     Motor: Tremor present.     Gait: Gait normal.     Comments: fidgety  Psychiatric:        Attention and Perception: Attention and perception normal.        Mood and Affect: Mood is anxious and depressed.        Speech: Speech is rapid and pressured.        Behavior: Behavior is hyperactive. Behavior is not agitated or aggressive.        Thought  Content: Thought content is not paranoid. Thought content does not include homicidal or suicidal ideation.        Cognition and Memory: Cognition normal.     Comments: Insight and judgment fair. Chronically hyper style. Directable. Easily distractible. Still More fidgety than usual.     Lab Review:     Component Value Date/Time   NA 137 08/21/2016 1303   NA 142 03/20/2014 0514   K 3.5 08/21/2016 1303   K 4.1 03/20/2014 0514   CL 105 08/21/2016 1303   CL 112 (H) 03/20/2014 0514   CO2 23 08/21/2016 1303   CO2 26 03/20/2014 0514   GLUCOSE 124 (H) 08/21/2016 1303   GLUCOSE 99 03/20/2014 0514   BUN 8 08/21/2016 1303   BUN 7 03/20/2014 0514   CREATININE 1.01 08/21/2016 1303   CREATININE 0.78 03/20/2014 0514   CALCIUM 9.8 08/21/2016 1303   CALCIUM 9.5 03/20/2014 0514   PROT 7.8 08/21/2016 1303   PROT 7.3 03/18/2014 1459   ALBUMIN 4.6 08/21/2016 1303   ALBUMIN 3.2 (L) 03/18/2014 1459   AST 24 08/21/2016 1303   AST 16 03/18/2014 1459   ALT 29 08/21/2016 1303   ALT 16 03/18/2014 1459   ALKPHOS 63 08/21/2016 1303   ALKPHOS 105 03/18/2014 1459   BILITOT 1.0 08/21/2016 1303   BILITOT 0.3 03/18/2014 1459   GFRNONAA >60 08/21/2016 1303   GFRNONAA >60 03/20/2014 0514   GFRAA >60 08/21/2016 1303   GFRAA >60 03/20/2014 0514       Component Value Date/Time   WBC 9.2 08/21/2016 1303   RBC 4.67 08/21/2016 1303   HGB 15.1 08/21/2016 1303   HGB 12.6 (L) 03/20/2014 0514   HCT 43.9 08/21/2016  1303   HCT 38.4 (L) 03/20/2014 0514   PLT 413 08/21/2016 1303   PLT 346 03/20/2014 0514   MCV 94.1 08/21/2016 1303   MCV 89 03/20/2014 0514   MCH 32.3 08/21/2016 1303   MCHC 34.4 08/21/2016 1303   RDW 13.2 08/21/2016 1303   RDW 12.5 03/20/2014 0514   LYMPHSABS 3.2 03/20/2014 0514   MONOABS 1.0 03/20/2014 0514   EOSABS 0.4 03/20/2014 0514   BASOSABS 0.1 03/20/2014 0514    Lithium Lvl  Date Value Ref Range Status  08/21/2016 1.00 0.60 - 1.20 mmol/L Final    Last lithium level Sept 0.7.  Recent lipids ok except higher TG than usual.  Normal A1C.  No results found for: PHENYTOIN, PHENOBARB, VALPROATE, CBMZ   .res Assessment: Plan:    Bipolar 1 disorder, mixed, moderate (HCC)  Generalized anxiety disorder  Panic disorder with agoraphobia   Chronic TR bipolar mixed and chronic anxiety.  He still has mixed bipolar symptoms which we have not been able to completely eliminate.  He is probably at close to his optimal baseline but is still bothered by symptoms especially anxiety.  See long list of meds tried.  Limits other options.    No higher than 4 mg Xanax daily will be allowed.  He brought in a handout describing the side effects of benzodiazepines we discussed this in detail.  We discussed the short-term risks associated with benzodiazepines including sedation and increased fall risk among others.  Discussed long-term side effect risk including dependence, potential withdrawal symptoms, and the potential eventual dose-related risk of dementia.  Disc prior panic and ways to reduce the meds and prior withdrawal sx.  Lithium level stable and doesn't tolerate nor benefit from higher doses.  Counseled patient regarding potential benefits, risks, and  side effects of lithium to include potential risk of lithium affecting thyroid and renal function.  Discussed need for periodic lab monitoring to determine drug level and to assess for potential adverse effects.  Counseled patient regarding  signs and symptoms of lithium toxicity and advised that they notify office immediately or seek urgent medical attention if experiencing these signs and symptoms.  Patient advised to contact office with any questions or concerns.  Sleep hygiene.  Increase activity.  Bored.  Disc unusual combo antipsychotics but it helps more than other options.  Discussed potential metabolic side effects associated with atypical antipsychotics, as well as potential risk for movement side effects. Advised pt to contact office if movement side effects occur.   Take LED Seroquel to sleep.  No indication for dosage changes.  Please see After Visit Summary for patient specific instructions.: Start Fanapt at night 1 mg for 2 nights, then 2 mg for 2 nights, then 4 mg for 2 nights, then 6 mg for 2 nights, then 8 mg for 2 nights, then 10 mg for 2 nights, then 12 mg or 2 of the 6 mg tablets each night and then decrease olanzapine by half for 5 days then stop the olanzapine  This appt was 30 mins.  FU 4 weeks.  Meredith Staggers, MD, DFAPA  Future Appointments  Date Time Provider Department Center  08/01/2018  1:30 PM Cottle, Steva Ready., MD [facility] None  08/29/2018  1:00 PM Cottle, Steva Ready., MD [facility] None  12/25/2018 10:45 AM Sondra Come, MD [facility] None    No orders of the defined types were placed in this encounter.     -------------------------------

## 2018-06-28 NOTE — Patient Instructions (Signed)
Start Fanapt at night 1 mg for 2 nights, then 2 mg for 2 nights, then 4 mg for 2 nights, then 6 mg for 2 nights, then 8 mg for 2 nights, then 10 mg for 2 nights, then 12 mg or 2 of the 6 mg tablets each night and then decrease olanzapine by half for 5 days then stop the olanzapine

## 2018-07-06 ENCOUNTER — Telehealth: Payer: Self-pay | Admitting: Psychiatry

## 2018-07-06 NOTE — Telephone Encounter (Signed)
FYI Pt has been consistently taking Xanax 4mg /day, wanting to go back down to just 3mg /day. He tried to do that Monday but he realized he's "addicted" to them. Explained to pt that his body needs to adjust to the decrease. Recommend he take 3.5mg /day before he jumps down to the 3mg  due to having withdrawal symptoms. Tried explaining the times to do that, confirmed he understood but nurse not sure. Instructed to call back with any problems or concerns.

## 2018-07-06 NOTE — Telephone Encounter (Signed)
Directions is not to exceed more than 4mg /day on xanax

## 2018-07-06 NOTE — Telephone Encounter (Signed)
Patient called and has gotten confused about the amount of xanax he is to take. He took 4 one day and then the next day he didin't take any . Please call him to clarify the dosage amount. [phone]

## 2018-07-06 NOTE — Telephone Encounter (Signed)
Excellent advice and direction.  I agree with the plan  He has had Xanax withdrawal in the past when he abruptly discontinued it.  He should remember that experience.

## 2018-07-11 ENCOUNTER — Telehealth: Payer: Self-pay | Admitting: Psychiatry

## 2018-07-11 NOTE — Telephone Encounter (Signed)
Albert [name] called because he had mistaken his medication last week and has it all "out of wack".  He is trying to get it back on track and wants to be sure he is doing it correctly.  Please call to clarify the directions he should be following.  Next appt 08/01/18

## 2018-07-12 NOTE — Telephone Encounter (Signed)
Spoke with patient given instructions at reducing the Xanax to 0.5 mg 4 times daily to try to clear up the confusion.  Spoke with his mother also who is helpful in communicating the instructions to him.  He is agreement

## 2018-07-12 NOTE — Telephone Encounter (Signed)
TC  Nurse noted that he's been a bit confused and required repeating the instructions repeatedly and still calling back the next day asking the same questions again.  I've been out of sorts.  Call pharmacist and they think  It could be Fanapt 12 mg which was started at the last visit.  Disc timing of changes in the meds.  Last few days taking Xanax 1 mg TID except extra 1/2 last night bc anxious and couldn't sleep.  Mother notices he's more confused for a couple weeks at least.  No time of day.   No excessive sleepiness.    Reduce the xanax to 0.5 QID bc of the confusion and we'll focus on the anxiety later once we determine the cause of the confusion.  Disc risk of withdrawal.  In emergency can take extra Xanax.  Consider change to lorazepam which sometimes has less cognitive impairment. Mother took notes and will help administer and guide him.  Meredith Staggers, MD, DFAPA

## 2018-07-14 ENCOUNTER — Telehealth: Payer: Self-pay | Admitting: Psychiatry

## 2018-07-14 NOTE — Telephone Encounter (Signed)
RTC 20 mins  Still on Olanazapine 10.  Just got to Fanapt 12.  Has reduced the Xanax to 0.5 mg QID.  Last night took 1200 Seroquel.  M says he's a little less confused.  Called M 2 nights in a row CO SI and staying at mother's house.  Last night could not calm down.  Took extra Seroquel last night to sleep.  Has voiced SI today also.  Says he cannot tolerate more olanzapine bc of  Akathisia.  Discussed safety plan at length with patient.  Advised patient to contact office with any worsening signs and symptoms.  Instructed patient to go to the [facility] emergency room for evaluation if experiencing any acute safety concerns, to include suicidal intent. Disc poss hospitalization.   Plan:   Check lithium  Level  Stop Fanapt.may be having SE.  Continue Seroquel 1200, if needed.  Increase  Xanax  1mg  in AM, 0.5 mg N, 1mg  6PM, 0.5mg  HS.  Appears to be having some Xanax with drawal.  If not better by Monday call.    Meredith Staggers, MD, DFAPA

## 2018-07-26 ENCOUNTER — Other Ambulatory Visit: Payer: Self-pay | Admitting: Psychiatry

## 2018-07-26 ENCOUNTER — Telehealth: Payer: Self-pay | Admitting: Psychiatry

## 2018-07-26 DIAGNOSIS — Z79899 Other long term (current) drug therapy: Secondary | ICD-10-CM

## 2018-07-26 DIAGNOSIS — F3162 Bipolar disorder, current episode mixed, moderate: Secondary | ICD-10-CM

## 2018-07-26 NOTE — Telephone Encounter (Signed)
Albert [name] is calling because Albert [name] was with her today. She is concerned because his thinking is not very clear.  Not sure about lithium level. They have checked his temperature and it's around 99. He does not feel bad, no aches, but is checking his temp because he wants to make sure he doesn't have virus.  Concerned because words may not come out right and seems a little off. Taking meds as prescribed.

## 2018-07-26 NOTE — Progress Notes (Signed)
Patient having some confusion.  He has a history of lithium toxicity.  Will check lithium level and BMP.

## 2018-07-26 NOTE — Telephone Encounter (Signed)
Please have patient get lithium level and labs at [facility].  I have sent in the order

## 2018-07-27 NOTE — Telephone Encounter (Signed)
Great thank you!

## 2018-07-27 NOTE — Telephone Encounter (Signed)
I gave message to Albert [name] to go get lith level.

## 2018-07-28 ENCOUNTER — Telehealth: Payer: Self-pay | Admitting: Psychiatry

## 2018-07-28 ENCOUNTER — Other Ambulatory Visit: Payer: Self-pay | Admitting: Psychiatry

## 2018-07-28 DIAGNOSIS — R3914 Feeling of incomplete bladder emptying: Principal | ICD-10-CM

## 2018-07-28 DIAGNOSIS — N401 Enlarged prostate with lower urinary tract symptoms: Secondary | ICD-10-CM

## 2018-07-28 NOTE — Telephone Encounter (Signed)
Last fill 02/27 #100

## 2018-07-28 NOTE — Telephone Encounter (Signed)
Pt called need new Rx for [facility] sent to pharm. No refills left will be out in 2 days. [facility] in [place]

## 2018-07-28 NOTE — Telephone Encounter (Signed)
rx called into pharmacy

## 2018-08-01 ENCOUNTER — Encounter: Payer: Self-pay | Admitting: Psychiatry

## 2018-08-01 ENCOUNTER — Ambulatory Visit (INDEPENDENT_AMBULATORY_CARE_PROVIDER_SITE_OTHER): Payer: Medicare Other | Admitting: Psychiatry

## 2018-08-01 ENCOUNTER — Other Ambulatory Visit: Payer: Self-pay

## 2018-08-01 DIAGNOSIS — Z79899 Other long term (current) drug therapy: Secondary | ICD-10-CM

## 2018-08-01 DIAGNOSIS — F4001 Agoraphobia with panic disorder: Secondary | ICD-10-CM | POA: Diagnosis not present

## 2018-08-01 DIAGNOSIS — F411 Generalized anxiety disorder: Secondary | ICD-10-CM

## 2018-08-01 DIAGNOSIS — F5105 Insomnia due to other mental disorder: Secondary | ICD-10-CM

## 2018-08-01 DIAGNOSIS — F3162 Bipolar disorder, current episode mixed, moderate: Secondary | ICD-10-CM | POA: Diagnosis not present

## 2018-08-01 DIAGNOSIS — R4182 Altered mental status, unspecified: Secondary | ICD-10-CM

## 2018-08-01 NOTE — Progress Notes (Signed)
Albert [name] [id number] [birth year] 53 y.o.  Subjective:   Patient ID:  Albert [name] is a 53 y.o. (DOB 1965/07/09) male.  Chief Complaint:  Chief Complaint  Patient presents with  . Anxiety  . Altered Mental Status  . Medication Problem  . Memory Loss  . fleeting suicidal thoughts    HPI  Spoke with patient and [name] presents to the office today for follow-up of mixed bipolar, anxiety and still grieving stress of breakup.  Last seen June 28, 2018.  Call the office 6 times since then variously complaining of feeling confused and feeling anxious.  He required multiple repetitions of instructions on how to take the medication.  It was not clear what was causing him to be so acutely confused.  His mother was helping with the medications.  We ultimately decided to stop the Fanapt in hopes that that was the cause of the confusion.  He's done better re: confusion with the reduction in the Xanax.  Anxiety is manageable at this time but he can tell he's taking less.  Also stopped Fanapt.  Felt better off it.  Less jittery and depressed.    Still episodic, brief SI even without pcpt.  Always spends a lot of time just laying around.  Anxiety is worse than usual but not worse than last visit..  Occ rumination and rapid thoughts over exGF who got in another relationship and moved to [facility].  Can have anxiety for no reason but believes it's grief over rel breakup in August.  Sleep ok.  Some chronic depression and hyperactivity and loudness and hyperverbal.  Usually back to sleep.  Total 6 hours but some napping.  Still depressed and anxious too.  Still lacks interest and motivation.  M reports he's less confusion and seems to be taking meds correctly.  Still has some STM problems.  F still works at [employer].  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  Psych med hx extensive including ECT and risperidone, lithium 1200 SE, Zyprexa 20-15 akathisia, Latuda 80 which caused akathisia, Abilify,  Vraylar, Rexulti, aripiprazole 20 mg with akathisia,Seroquel 1000 mg,  InVega, Geodon, lamotrigine 300 mg, Depakote 2000 mg, Saphris with side effects, symbyax,Tegretol,Trileptal and several of these in combinations, Fanapt NR .  N-acetylcysteine, Nuedexta,  Belsomra with no response, Viibryd 40 mg for 3 months with diarrhea, protriptyline with side effects, clonidine, Trintellix 20 mg, Parnate 50 mg with no response, gabapentin, imipramine, Lunesta no response, venlafaxine,  Emsam 12 mg for 2 months, trazodone 200 mg,  methylphenidate 60 mg, bupropion was side effects,  Strattera,  Lexapro 20 mg, , modafinil, sertraline, paroxetine, Deplin, pramipexole, Vyvanse, Concerta, amantadine ,Patient prone to akathisia.  Review of Systems:  Review of Systems  Neurological: Positive for tremors. Negative for weakness.       Fidgety  Psychiatric/Behavioral: Positive for decreased concentration and suicidal ideas. Negative for agitation, behavioral problems, confusion, dysphoric mood, hallucinations, self-injury and sleep disturbance. The patient is nervous/anxious and is hyperactive.     Medications: I have reviewed the patient's current medications.  Current Outpatient Medications  Medication Sig Dispense Refill  . ALPRAZolam (XANAX) 1 MG tablet TAKE ONE TABLET BY MOUTH EVERY 6 HOURS AS NEEDED (Patient taking differently: Take 1 in AM and 6 PM, take 1/2 at noon and bedtime) 100 tablet 0  . esomeprazole (NEXIUM) 20 MG capsule Take by mouth.    . fluticasone (FLONASE) 50 MCG/ACT nasal spray     . levothyroxine (SYNTHROID, LEVOTHROID)  75 MCG tablet Take by mouth.    . lithium carbonate 300 MG capsule TAKE 3 CAPSULES BY MOUTH EVERY EVENING 90 capsule 2  . OLANZapine (ZYPREXA) 10 MG tablet TAKE ONE TABLET BY MOUTH AT BEDTIME 30 tablet 2  . QUEtiapine (SEROQUEL) 400 MG tablet TAKE TWO TABLETS BY MOUTH AT BEDTIME 60 tablet 2  . sildenafil (REVATIO) 20 MG tablet Take 3-5 tablets (60-100 mg total) by mouth as  needed. 30 tablet 11  . tamsulosin (FLOMAX) 0.4 MG CAPS capsule Take 1 capsule (0.4 mg total) by mouth daily after supper. 30 capsule 11  . meclizine (ANTIVERT) 25 MG tablet Take 1 tablet (25 mg total) by mouth 3 (three) times daily as needed for dizziness. (Patient not taking: Reported on 08/01/2018) 20 tablet 0  . ondansetron (ZOFRAN ODT) 4 MG disintegrating tablet Take 1 tablet (4 mg total) by mouth every 8 (eight) hours as needed for nausea or vomiting. 20 tablet 0   No current facility-administered medications for this visit.    Medication Side Effects: Other: mild sleepiness.  Allergies:  Allergies  Allergen Reactions  . Meloxicam Other (See Comments)    Other reaction(s): Dizziness  . Prednisone   . Wellbutrin [Bupropion]     Past Medical History:  Diagnosis Date  . Anxiety   . Bipolar disorder (HCC)   . Depression   . GERD (gastroesophageal reflux disease)     Family History  Problem Relation Age of Onset  . Prostate cancer Neg Hx   . Bladder Cancer Neg Hx   . Kidney cancer Neg Hx     Social History   Socioeconomic History  . Marital status: Single    Spouse name: Not on file  . Number of children: Not on file  . Years of education: Not on file  . Highest education level: Not on file  Occupational History  . Not on file  Social Needs  . Financial resource strain: Not on file  . Food insecurity:    Worry: Not on file    Inability: Not on file  . Transportation needs:    Medical: Not on file    Non-medical: Not on file  Tobacco Use  . Smoking status: Former Games developermoker  . Smokeless tobacco: Never Used  Substance and Sexual Activity  . Alcohol use: Not Currently  . Drug use: Never  . Sexual activity: Not on file  Lifestyle  . Physical activity:    Days per week: Not on file    Minutes per session: Not on file  . Stress: Not on file  Relationships  . Social connections:    Talks on phone: Not on file    Gets together: Not on file    Attends religious  service: Not on file    Active member of club or organization: Not on file    Attends meetings of clubs or organizations: Not on file    Relationship status: Not on file  . Intimate partner violence:    Fear of current or ex partner: Not on file    Emotionally abused: Not on file    Physically abused: Not on file    Forced sexual activity: Not on file  Other Topics Concern  . Not on file  Social History Narrative  . Not on file    Past Medical History, Surgical history, Social history, and Family history were reviewed and updated as appropriate.   Please see review of systems for further details on the patient's review from  today.   Objective:   Physical Exam:  There were no vitals taken for this visit.  Physical Exam Neurological:     Mental Status: He is alert and oriented to person, place, and time.     Cranial Nerves: No dysarthria.  Psychiatric:        Attention and Perception: He perceives auditory hallucinations. He does not perceive visual hallucinations.        Mood and Affect: Mood is anxious and depressed. Affect is not angry or tearful.        Speech: Speech normal. Speech is not slurred.        Behavior: Behavior is not slowed. Behavior is cooperative.        Thought Content: Thought content normal. Thought content is not paranoid or delusional. Thought content does not include homicidal or suicidal ideation. Thought content does not include homicidal or suicidal plan.        Cognition and Memory: He exhibits impaired recent memory.        Judgment: Judgment normal.     Comments: Insight fair     Lab Review:     Component Value Date/Time   NA 137 08/21/2016 1303   NA 142 03/20/2014 0514   K 3.5 08/21/2016 1303   K 4.1 03/20/2014 0514   CL 105 08/21/2016 1303   CL 112 (H) 03/20/2014 0514   CO2 23 08/21/2016 1303   CO2 26 03/20/2014 0514   GLUCOSE 124 (H) 08/21/2016 1303   GLUCOSE 99 03/20/2014 0514   BUN 8 08/21/2016 1303   BUN 7 03/20/2014 0514    CREATININE 1.01 08/21/2016 1303   CREATININE 0.78 03/20/2014 0514   CALCIUM 9.8 08/21/2016 1303   CALCIUM 9.5 03/20/2014 0514   PROT 7.8 08/21/2016 1303   PROT 7.3 03/18/2014 1459   ALBUMIN 4.6 08/21/2016 1303   ALBUMIN 3.2 (L) 03/18/2014 1459   AST 24 08/21/2016 1303   AST 16 03/18/2014 1459   ALT 29 08/21/2016 1303   ALT 16 03/18/2014 1459   ALKPHOS 63 08/21/2016 1303   ALKPHOS 105 03/18/2014 1459   BILITOT 1.0 08/21/2016 1303   BILITOT 0.3 03/18/2014 1459   GFRNONAA >60 08/21/2016 1303   GFRNONAA >60 03/20/2014 0514   GFRAA >60 08/21/2016 1303   GFRAA >60 03/20/2014 0514       Component Value Date/Time   WBC 9.2 08/21/2016 1303   RBC 4.67 08/21/2016 1303   HGB 15.1 08/21/2016 1303   HGB 12.6 (L) 03/20/2014 0514   HCT 43.9 08/21/2016 1303   HCT 38.4 (L) 03/20/2014 0514   PLT 413 08/21/2016 1303   PLT 346 03/20/2014 0514   MCV 94.1 08/21/2016 1303   MCV 89 03/20/2014 0514   MCH 32.3 08/21/2016 1303   MCHC 34.4 08/21/2016 1303   RDW 13.2 08/21/2016 1303   RDW 12.5 03/20/2014 0514   LYMPHSABS 3.2 03/20/2014 0514   MONOABS 1.0 03/20/2014 0514   EOSABS 0.4 03/20/2014 0514   BASOSABS 0.1 03/20/2014 0514    Lithium Lvl  Date Value Ref Range Status  08/21/2016 1.00 0.60 - 1.20 mmol/L Final    Last lithium level Sept 0.7. Said he got lithium level at [facility] as requested.  Labs not in Epic.  Recent lipids ok except higher TG than usual.  Normal A1C.  No results found for: PHENYTOIN, PHENOBARB, VALPROATE, CBMZ   .res Assessment: Plan:    Bipolar 1 disorder, mixed, moderate (HCC)  Generalized anxiety disorder  Panic disorder with agoraphobia  Lithium use  Altered mental status, unspecified altered mental status type  Insomnia due to mental condition   Chronic TR bipolar mixed and chronic anxiety.  He still has mixed bipolar symptoms which we have not been able to completely eliminate.   See long list of meds tried.  Limits other options.    Unfortunately  due to the severity of set his symptoms polypharmacy is a necessity.  He has had a great deal of unexpected confusion since he was here the last time.  He is taking the alprazolam at the proper times and that has led to confusion.  He has better since we have reduced the alprazolam to the current dosage which is as follows: Alprazolam 1 mg tablet 1 in the morning and 1 at 6 PM, 1/2 tablet at noon and bedtime.  Xanax may be cause of poor STM.  Will try to reduce it further mid April.    We discussed the short-term risks associated with benzodiazepines including sedation and increased fall risk among others.  Discussed long-term side effect risk including dependence, potential withdrawal symptoms, and the potential eventual dose-related risk of dementia.  Disc prior panic and ways to reduce the meds and prior withdrawal sx.  Consider change to Ativan bc it is less sedating.  Lithium level pending and doesn't tolerate nor benefit from higher doses.  Counseled patient regarding potential benefits, risks, and side effects of lithium to include potential risk of lithium affecting thyroid and renal function.  Discussed need for periodic lab monitoring to determine drug level and to assess for potential adverse effects.  Counseled patient regarding signs and symptoms of lithium toxicity and advised that they notify office immediately or seek urgent medical attention if experiencing these signs and symptoms.  Patient advised to contact office with any questions or concerns.  Sleep hygiene.  Increase activity.  Seroquel is being used to manage his insomnia but also his bipolar disorder.  He will continue the olanzapine in combination with it for additional benefit and for its antianxiety effect in hopes of helping [place] reduce the alprazolam later  Disc unusual combo antipsychotics but it helps more than other options.  Discussed potential metabolic side effects associated with atypical antipsychotics, as well as  potential risk for movement side effects. Advised pt to contact office if movement side effects occur.   Take LED Seroquel to sleep.  No indication for dosage changes.  Will locate lithium level.  This appt was 30 mins.  FU 2 weeks due to his worsening symptoms and confusion.  I connected with patient by a video enabled telemedicine application or telephone, with their informed consent, and verified patient privacy and that I am speaking with the correct person using two identifiers.  I was located at office and patient at home.   Meredith Staggers, MD, DFAPA  Future Appointments  Date Time Provider Department Center  08/18/2018  1:30 PM Cottle, Steva Ready., MD [facility] None  08/29/2018  1:00 PM Cottle, Steva Ready., MD [facility] None  12/25/2018 10:45 AM Sondra Come, MD [facility] None    No orders of the defined types were placed in this encounter.     -------------------------------

## 2018-08-18 ENCOUNTER — Ambulatory Visit (INDEPENDENT_AMBULATORY_CARE_PROVIDER_SITE_OTHER): Payer: Medicare Other | Admitting: Psychiatry

## 2018-08-18 ENCOUNTER — Encounter: Payer: Self-pay | Admitting: Psychiatry

## 2018-08-18 ENCOUNTER — Other Ambulatory Visit: Payer: Self-pay

## 2018-08-18 ENCOUNTER — Telehealth: Payer: Self-pay | Admitting: Psychiatry

## 2018-08-18 DIAGNOSIS — F411 Generalized anxiety disorder: Secondary | ICD-10-CM | POA: Diagnosis not present

## 2018-08-18 DIAGNOSIS — F3162 Bipolar disorder, current episode mixed, moderate: Secondary | ICD-10-CM | POA: Diagnosis not present

## 2018-08-18 DIAGNOSIS — F4001 Agoraphobia with panic disorder: Secondary | ICD-10-CM | POA: Diagnosis not present

## 2018-08-18 DIAGNOSIS — Z79899 Other long term (current) drug therapy: Secondary | ICD-10-CM

## 2018-08-18 DIAGNOSIS — R6889 Other general symptoms and signs: Secondary | ICD-10-CM | POA: Diagnosis not present

## 2018-08-18 DIAGNOSIS — F5105 Insomnia due to other mental disorder: Secondary | ICD-10-CM

## 2018-08-18 MED ORDER — LORAZEPAM 2 MG PO TABS: ORAL_TABLET | ORAL | 1 refills | Status: DC

## 2018-08-18 NOTE — Telephone Encounter (Signed)
Instructions clarified with pharmacist.

## 2018-08-18 NOTE — Telephone Encounter (Signed)
Pt had Tele visit today.Stated pharmacy want fill med because of the directions CC wrote on it.(Ativan) Pt uses [facility] in [place]

## 2018-08-18 NOTE — Addendum Note (Signed)
Addended by: Kirstie Peri on: 08/18/2018 04:28 PM   Modules accepted: Orders

## 2018-08-18 NOTE — Progress Notes (Addendum)
DRAYLEN LOBUE [id number] 1965-11-18 53 y.o.  Subjective:   Patient ID:  Albert [name] is a 53 y.o. (DOB 12-Nov-1965) male.  Chief Complaint:  Chief Complaint  Patient presents with  . Follow-up    Medication Management  . Depression    Described as fleeting  . Anxiety    Racing thoughts  . Altered Mental Status  . Memory Loss    Depression         Associated symptoms include decreased concentration and suicidal ideas.  Past medical history includes anxiety.   Anxiety  Symptoms include decreased concentration, nervous/anxious behavior and suicidal ideas. Patient reports no confusion.      Spoke with patient and [name] presents today for follow-up of mixed bipolar, anxiety and still grieving stress of breakup.  Last seen August 01, 2018.  Call the office 6 times before the last appointment then variously complaining of feeling confused and feeling anxious.  He required multiple repetitions of instructions on how to take the medication.  It was not clear what was causing him to be so acutely confused.  His mother was helping with the medications.  We ultimately decided to stop the Fanapt in hopes that that was the cause of the confusion.  Less panic but more anxious generally.  Thinks the Covid and having to deal with phone sessions are a problem.  Fidgety.  Racing thoughts.  Sleep is same and it's OK. 6 hours pretty normal.  Still some confusion problems and memory problems.  Hard to concentrate. Sometimes hyperactive but thinks it is anxiety.  Can complete some tasks.  Will go to grocery store.  He's done better re: confusion with the reduction in the Xanax but is still a problem.  Anxiety is manageable at this time but he can tell he's taking less.  Also stopped Fanapt.  Felt better off it.  Less jittery and depressed.  Not walking.  Still episodic, brief SI even without pcpt.  Always spends a lot of time just laying around.  Anxiety is worse than usual but not worse than  last visit..  Occ rumination and rapid thoughts over exGF who got in another relationship and moved to [facility].  Can have anxiety for no reason but believes it's grief over rel breakup in August.  Sleep ok.  Some chronic depression and hyperactivity and loudness and hyperverbal.  Usually back to sleep.  Total 6 hours but some napping.  Still depressed and anxious too.  Still lacks interest and motivation.  M reports he's less confusion and seems to be taking meds correctly.  Still has some STM problems.  Better than 2-3 weeks ago.  F still works at [employer].  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  Psych med hx extensive including ECT and risperidone, lithium 1200 SE, Zyprexa 20-15 akathisia, Latuda 80 which caused akathisia, Abilify, Vraylar, Rexulti, aripiprazole 20 mg with akathisia,Seroquel 1000 mg,  InVega, Geodon, lamotrigine 300 mg, Depakote 2000 mg, Saphris with side effects, symbyax,Tegretol,Trileptal and several of these in combinations, Fanapt NR .  N-acetylcysteine, Nuedexta,  Belsomra with no response, Viibryd 40 mg for 3 months with diarrhea, protriptyline with side effects, clonidine, Trintellix 20 mg, Parnate 50 mg with no response, gabapentin, imipramine, Lunesta no response, venlafaxine,  Emsam 12 mg for 2 months, trazodone 200 mg,  methylphenidate 60 mg, bupropion was side effects,  Strattera,  Lexapro 20 mg, , modafinil, sertraline, paroxetine, Deplin, pramipexole, Vyvanse, Concerta, amantadine ,Patient prone to akathisia.  Review  of Systems:  Review of Systems  Neurological: Positive for tremors. Negative for weakness.       Fidgety  Psychiatric/Behavioral: Positive for decreased concentration, depression and suicidal ideas. Negative for agitation, behavioral problems, confusion, dysphoric mood, hallucinations, self-injury and sleep disturbance. The patient is nervous/anxious and is hyperactive.     Medications: I have reviewed the patient's current medications.  Current Outpatient  Medications  Medication Sig Dispense Refill  . esomeprazole (NEXIUM) 20 MG capsule Take by mouth.    . fluticasone (FLONASE) 50 MCG/ACT nasal spray     . levothyroxine (SYNTHROID, LEVOTHROID) 75 MCG tablet Take by mouth.    . lithium carbonate 300 MG capsule TAKE 3 CAPSULES BY MOUTH EVERY EVENING 90 capsule 2  . meclizine (ANTIVERT) 25 MG tablet Take 1 tablet (25 mg total) by mouth 3 (three) times daily as needed for dizziness. 20 tablet 0  . OLANZapine (ZYPREXA) 10 MG tablet TAKE ONE TABLET BY MOUTH AT BEDTIME 30 tablet 2  . ondansetron (ZOFRAN ODT) 4 MG disintegrating tablet Take 1 tablet (4 mg total) by mouth every 8 (eight) hours as needed for nausea or vomiting. 20 tablet 0  . QUEtiapine (SEROQUEL) 400 MG tablet TAKE TWO TABLETS BY MOUTH AT BEDTIME 60 tablet 2  . sildenafil (REVATIO) 20 MG tablet Take 3-5 tablets (60-100 mg total) by mouth as needed. 30 tablet 11  . tamsulosin (FLOMAX) 0.4 MG CAPS capsule Take 1 capsule (0.4 mg total) by mouth daily after supper. 30 capsule 11  . LORazepam (ATIVAN) 2 MG tablet 1 mg tablet 1 in the morning and 1 at 6 PM, 1/2 tablet at noon and bedtime. 90 tablet 1   No current facility-administered medications for this visit.    Medication Side Effects: Other: mild sleepiness.  Allergies:  Allergies  Allergen Reactions  . Meloxicam Other (See Comments)    Other reaction(s): Dizziness  . Prednisone   . Wellbutrin [Bupropion]     Past Medical History:  Diagnosis Date  . Anxiety   . Bipolar disorder (HCC)   . Depression   . GERD (gastroesophageal reflux disease)     Family History  Problem Relation Age of Onset  . Prostate cancer Neg Hx   . Bladder Cancer Neg Hx   . Kidney cancer Neg Hx     Social History   Socioeconomic History  . Marital status: Single    Spouse name: Not on file  . Number of children: Not on file  . Years of education: Not on file  . Highest education level: Not on file  Occupational History  . Not on file   Social Needs  . Financial resource strain: Not on file  . Food insecurity:    Worry: Not on file    Inability: Not on file  . Transportation needs:    Medical: Not on file    Non-medical: Not on file  Tobacco Use  . Smoking status: Former Games developer  . Smokeless tobacco: Never Used  Substance and Sexual Activity  . Alcohol use: Not Currently  . Drug use: Never  . Sexual activity: Not on file  Lifestyle  . Physical activity:    Days per week: Not on file    Minutes per session: Not on file  . Stress: Not on file  Relationships  . Social connections:    Talks on phone: Not on file    Gets together: Not on file    Attends religious service: Not on file    Active  member of club or organization: Not on file    Attends meetings of clubs or organizations: Not on file    Relationship status: Not on file  . Intimate partner violence:    Fear of current or ex partner: Not on file    Emotionally abused: Not on file    Physically abused: Not on file    Forced sexual activity: Not on file  Other Topics Concern  . Not on file  Social History Narrative  . Not on file    Past Medical History, Surgical history, Social history, and Family history were reviewed and updated as appropriate.   Please see review of systems for further details on the patient's review from today.   Objective:   Physical Exam:  There were no vitals taken for this visit.  Physical Exam Neurological:     Mental Status: He is alert and oriented to person, place, and time.     Cranial Nerves: No dysarthria.  Psychiatric:        Attention and Perception: He perceives auditory hallucinations. He does not perceive visual hallucinations.        Mood and Affect: Mood is anxious and depressed. Affect is not angry or tearful.        Speech: Speech normal. Speech is not slurred.        Behavior: Behavior is not slowed. Behavior is cooperative.        Thought Content: Thought content normal. Thought content is not  paranoid or delusional. Thought content does not include homicidal or suicidal ideation. Thought content does not include homicidal or suicidal plan.        Cognition and Memory: He exhibits impaired recent memory.        Judgment: Judgment normal.     Comments: Insight fair     Lab Review:     Component Value Date/Time   NA 137 08/21/2016 1303   NA 142 03/20/2014 0514   K 3.5 08/21/2016 1303   K 4.1 03/20/2014 0514   CL 105 08/21/2016 1303   CL 112 (H) 03/20/2014 0514   CO2 23 08/21/2016 1303   CO2 26 03/20/2014 0514   GLUCOSE 124 (H) 08/21/2016 1303   GLUCOSE 99 03/20/2014 0514   BUN 8 08/21/2016 1303   BUN 7 03/20/2014 0514   CREATININE 1.01 08/21/2016 1303   CREATININE 0.78 03/20/2014 0514   CALCIUM 9.8 08/21/2016 1303   CALCIUM 9.5 03/20/2014 0514   PROT 7.8 08/21/2016 1303   PROT 7.3 03/18/2014 1459   ALBUMIN 4.6 08/21/2016 1303   ALBUMIN 3.2 (L) 03/18/2014 1459   AST 24 08/21/2016 1303   AST 16 03/18/2014 1459   ALT 29 08/21/2016 1303   ALT 16 03/18/2014 1459   ALKPHOS 63 08/21/2016 1303   ALKPHOS 105 03/18/2014 1459   BILITOT 1.0 08/21/2016 1303   BILITOT 0.3 03/18/2014 1459   GFRNONAA >60 08/21/2016 1303   GFRNONAA >60 03/20/2014 0514   GFRAA >60 08/21/2016 1303   GFRAA >60 03/20/2014 0514       Component Value Date/Time   WBC 9.2 08/21/2016 1303   RBC 4.67 08/21/2016 1303   HGB 15.1 08/21/2016 1303   HGB 12.6 (L) 03/20/2014 0514   HCT 43.9 08/21/2016 1303   HCT 38.4 (L) 03/20/2014 0514   PLT 413 08/21/2016 1303   PLT 346 03/20/2014 0514   MCV 94.1 08/21/2016 1303   MCV 89 03/20/2014 0514   MCH 32.3 08/21/2016 1303   MCHC 34.4 08/21/2016 1303  RDW 13.2 08/21/2016 1303   RDW 12.5 03/20/2014 0514   LYMPHSABS 3.2 03/20/2014 0514   MONOABS 1.0 03/20/2014 0514   EOSABS 0.4 03/20/2014 0514   BASOSABS 0.1 03/20/2014 0514    Lithium Lvl  Date Value Ref Range Status  08/21/2016 1.00 0.60 - 1.20 mmol/L Final    Last lithium level Sept 0.7. Last  lithium March 26, 1.0.  Recent lipids ok except higher TG than usual.  Normal A1C.  No results found for: PHENYTOIN, PHENOBARB, VALPROATE, CBMZ   .res Assessment: Plan:    Bipolar 1 disorder, mixed, moderate (HCC) - Plan: Lithium level  Forgetfulness  Generalized anxiety disorder  Panic disorder with agoraphobia  Insomnia due to mental condition  Lithium use - Plan: Lithium level   Chronic TR bipolar mixed and chronic anxiety and chronically unstable.  But he has been more unstable than usual lately with the additional element of cognitive problems..  He still has mixed bipolar symptoms which we have not been able to completely eliminate.   See long list of meds tried.  Limits other options.    Unfortunately due to the severity of set his symptoms polypharmacy is a necessity.  He has had a great deal of unexpected confusion since he was here the last time.  He is taking the alprazolam at the proper times and that has led to confusion.  He has better since we have reduced the alprazolam to the current dosage which is as follows: Alprazolam 1 mg tablet 1 in the morning and 1 at 6 PM, 1/2 tablet at noon and bedtime.  Xanax may be cause of poor STM.  Will try to reduce it further mid April vs switch to lorzepam.    We discussed the short-term risks associated with benzodiazepines including sedation and increased fall risk among others.  Discussed long-term side effect risk including dependence, potential withdrawal symptoms, and the potential eventual dose-related risk of dementia.  Disc prior panic and ways to reduce the meds and prior withdrawal sx.  Consider change to Ativan bc it is less sedating.  Lithium level 1.0.  Lithium stable.  Counseled patient regarding potential benefits, risks, and side effects of lithium to include potential risk of lithium affecting thyroid and renal function.  Discussed need for periodic lab monitoring to determine drug level and to assess for potential  adverse effects.  Counseled patient regarding signs and symptoms of lithium toxicity and advised that they notify office immediately or seek urgent medical attention if experiencing these signs and symptoms.  Patient advised to contact office with any questions or concerns.  Sleep hygiene.  Increase activity.  Seroquel is being used to manage his insomnia but also his bipolar disorder.  He will continue the olanzapine in combination with it for additional benefit and for its antianxiety effect in hopes of helping us reduce the alprazolam later  Disc unusual combo antipsychotics but it helps more than other options.  Discussed potential metabolic side effects associated with atypical antipsychotics, as well as potential risk for movement side effects. Advised pt to contact office if movement side effects occur.   Take LED Seroquel to sleep.  Trial change to lorazepam to reduce cognitive problems.  Disc dosing differences.  Stop Xanax. Start lorazepam 2 mg, 1  tablet 1 in the morning and 1 tablet at 6 PM, 1/2 tablet at noon and 1/2 tablet at bedtime.  Called pharmacy to clarify dosing [facility]  This appt was 30 mins.  Keep appt 4/28  due to his worsening symptoms and confusion.  I connected with patient by a video enabled telemedicine application or telephone, with their informed consent, and verified patient privacy and that I am speaking with the correct person using two identifiers.  I was located at office and patient at home.  Meredith Staggers, MD, DFAPA  Future Appointments  Date Time Provider Department Center  08/29/2018  1:00 PM Cottle, Steva Ready., MD [facility] None  12/25/2018 10:45 AM Sondra Come, MD [facility] None    Orders Placed This Encounter  Procedures  . Lithium level      -------------------------------

## 2018-08-28 ENCOUNTER — Other Ambulatory Visit: Payer: Self-pay | Admitting: Psychiatry

## 2018-08-29 ENCOUNTER — Other Ambulatory Visit: Payer: Self-pay

## 2018-08-29 ENCOUNTER — Ambulatory Visit (INDEPENDENT_AMBULATORY_CARE_PROVIDER_SITE_OTHER): Payer: Medicare Other | Admitting: Psychiatry

## 2018-08-29 ENCOUNTER — Encounter: Payer: Self-pay | Admitting: Psychiatry

## 2018-08-29 DIAGNOSIS — F411 Generalized anxiety disorder: Secondary | ICD-10-CM

## 2018-08-29 DIAGNOSIS — F4001 Agoraphobia with panic disorder: Secondary | ICD-10-CM

## 2018-08-29 DIAGNOSIS — R6889 Other general symptoms and signs: Secondary | ICD-10-CM

## 2018-08-29 DIAGNOSIS — F3162 Bipolar disorder, current episode mixed, moderate: Secondary | ICD-10-CM | POA: Diagnosis not present

## 2018-08-29 DIAGNOSIS — Z79899 Other long term (current) drug therapy: Secondary | ICD-10-CM

## 2018-08-29 DIAGNOSIS — F5105 Insomnia due to other mental disorder: Secondary | ICD-10-CM

## 2018-08-29 NOTE — Progress Notes (Signed)
Albert [name] [id number] 1966-02-16 53 y.o.   Virtual Visit via Telephone Note  I connected with pt by telephone and verified that I am speaking with the correct person using two identifiers.   I discussed the limitations, risks, security and privacy concerns of performing an evaluation and management service by telephone and the availability of in person appointments. I also discussed with the patient that there may be a patient responsible charge related to this service. The patient expressed understanding and agreed to proceed.  I discussed the assessment and treatment plan with the patient. The patient was provided an opportunity to ask questions and all were answered. The patient agreed with the plan and demonstrated an understanding of the instructions.   The patient was advised to call back or seek an in-person evaluation if the symptoms worsen or if the condition fails to improve as anticipated.  I provided 30 minutes of non-face-to-face time during this encounter. The call started at 1 and ended at 130. The patient was located at home and the provider was located office.   Subjective:   Patient ID:  Albert [name] is a 53 y.o. (DOB 1966/01/10) male.  Chief Complaint:  Chief Complaint  Patient presents with  . Follow-up    Medication Medication   . Anxiety    Medication Medication   . Depression    Medication Medication     Anxiety  Symptoms include decreased concentration and nervous/anxious behavior. Patient reports no confusion or suicidal ideas.    Depression         Associated symptoms include decreased concentration.  Associated symptoms include no suicidal ideas.  Past medical history includes anxiety.     Spoke with patient and [name] presents to the office today for follow-up of mixed bipolar, anxiety and still grieving stress of breakup.  Last visit 08/18/2018.  Has been unstable lately and we changed from Xanax to Ativan to hopefully clear his  thinking.We switched to Ativan  BID and  at noon and HS.  No Xanax. He thinks it's weaker than Xanax but no major panic like before.  Started on a Saturday and was sedated but not sure why.  Then realized he was taken olanzapine daytime by accident.  She helped him get it straight with the meds.  Stayed with parents and monitoring him.  As week progressed he got some better with clarity and no longer slurring. Mother says he's clearer.  Still some memory issues.sleep 5-6 hours nightly.  Depression is moderate.  Still episodic, brief, fleeting SI even without pcpt.  Always spends a lot of time just laying around.  .  Occ rumination and rapid thoughts over exGF who got in another relationship and moved to [facility]  Can have anxiety for no reason but believes it's grief over rel breakup in August.  Sleep ok.  Some chronic depression and hyperactivity and loudness and hyperverbal.  Usually back to sleep.  Total 6 hours but some napping.  Still depressed and anxious too.  Still lacks interest and motivation.  Can follow a TV show if interested.  M reports he's less confusion and seems to be taking meds correctly.  Still has some STM problems.  F still works at [employer].  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  Psych med hx extensive including ECT and risperidone, lithium 1200 SE, Zyprexa 20-15 akathisia, Latuda 80 which caused akathisia, Abilify, Vraylar, Rexulti, aripiprazole 20 mg with akathisia,Seroquel 1000 mg,  InVega, Geodon, lamotrigine  300 mg, Depakote 2000 mg, Saphris with side effects, symbyax,Tegretol,Trileptal and several of these in combinations, Fanapt NR . N-acetylcysteine, Nuedexta,  Belsomra with no response, Viibryd 40 mg for 3 months with diarrhea, protriptyline with side effects, clonidine, Trintellix 20 mg, Parnate 50 mg with no response, gabapentin, imipramine, Lunesta no response, venlafaxine,  Emsam 12 mg for 2 months, trazodone 200 mg,  methylphenidate 60 mg, bupropion was side  effects,  Strattera,  Lexapro 20 mg, , modafinil, sertraline, paroxetine, Deplin, pramipexole, Vyvanse, Concerta, amantadine ,Patient prone to akathisia.  Review of Systems:  Review of Systems  Neurological: Positive for tremors. Negative for weakness.       Fidgety  Psychiatric/Behavioral: Positive for decreased concentration and depression. Negative for agitation, behavioral problems, confusion, dysphoric mood, hallucinations, self-injury, sleep disturbance and suicidal ideas. The patient is nervous/anxious and is hyperactive.     Medications: I have reviewed the patient's current medications.  Current Outpatient Medications  Medication Sig Dispense Refill  . esomeprazole (NEXIUM) 20 MG capsule Take by mouth.    . fluticasone (FLONASE) 50 MCG/ACT nasal spray     . levothyroxine (SYNTHROID, LEVOTHROID) 75 MCG tablet Take by mouth.    . lithium carbonate 300 MG capsule TAKE 3 CAPSULES EVERY EVENING 90 capsule 2  . LORazepam (ATIVAN) 2 MG tablet 1  tablet 1 in the morning and 1 tablet at 6 PM, 1/2 tablet at noon and 1/2 tablet at bedtime 90 tablet 1  . meclizine (ANTIVERT) 25 MG tablet Take 1 tablet (25 mg total) by mouth 3 (three) times daily as needed for dizziness. 20 tablet 0  . OLANZapine (ZYPREXA) 10 MG tablet TAKE ONE TABLET BY MOUTH AT BEDTIME 30 tablet 2  . ondansetron (ZOFRAN ODT) 4 MG disintegrating tablet Take 1 tablet (4 mg total) by mouth every 8 (eight) hours as needed for nausea or vomiting. 20 tablet 0  . QUEtiapine (SEROQUEL) 400 MG tablet TAKE TWO TABLETS BY MOUTH AT BEDTIME 60 tablet 2  . sildenafil (REVATIO) 20 MG tablet Take 3-5 tablets (60-100 mg total) by mouth as needed. 30 tablet 11  . tamsulosin (FLOMAX) 0.4 MG CAPS capsule Take 1 capsule (0.4 mg total) by mouth daily after supper. 30 capsule 11   No current facility-administered medications for this visit.    Medication Side Effects: Other: mild sleepiness.  Allergies:  Allergies  Allergen Reactions  .  Meloxicam Other (See Comments)    Other reaction(s): Dizziness  . Prednisone   . Wellbutrin [Bupropion]     Past Medical History:  Diagnosis Date  . Anxiety   . Bipolar disorder (HCC)   . Depression   . GERD (gastroesophageal reflux disease)     Family History  Problem Relation Age of Onset  . Prostate cancer Neg Hx   . Bladder Cancer Neg Hx   . Kidney cancer Neg Hx     Social History   Socioeconomic History  . Marital status: Single    Spouse name: Not on file  . Number of children: Not on file  . Years of education: Not on file  . Highest education level: Not on file  Occupational History  . Not on file  Social Needs  . Financial resource strain: Not on file  . Food insecurity:    Worry: Not on file    Inability: Not on file  . Transportation needs:    Medical: Not on file    Non-medical: Not on file  Tobacco Use  . Smoking status: Former Games developer  .  Smokeless tobacco: Never Used  Substance and Sexual Activity  . Alcohol use: Not Currently  . Drug use: Never  . Sexual activity: Not on file  Lifestyle  . Physical activity:    Days per week: Not on file    Minutes per session: Not on file  . Stress: Not on file  Relationships  . Social connections:    Talks on phone: Not on file    Gets together: Not on file    Attends religious service: Not on file    Active member of club or organization: Not on file    Attends meetings of clubs or organizations: Not on file    Relationship status: Not on file  . Intimate partner violence:    Fear of current or ex partner: Not on file    Emotionally abused: Not on file    Physically abused: Not on file    Forced sexual activity: Not on file  Other Topics Concern  . Not on file  Social History Narrative  . Not on file    Past Medical History, Surgical history, Social history, and Family history were reviewed and updated as appropriate.   Please see review of systems for further details on the patient's review  from today.   Objective:   Physical Exam:  There were no vitals taken for this visit.  Physical Exam Neurological:     Mental Status: He is alert and oriented to person, place, and time.     Cranial Nerves: No dysarthria.  Psychiatric:        Attention and Perception: He does not perceive auditory or visual hallucinations.        Mood and Affect: Mood is anxious and depressed. Affect is not angry or tearful.        Speech: Speech normal. Speech is not slurred.        Behavior: Behavior is not slowed. Behavior is cooperative.        Thought Content: Thought content normal. Thought content is not paranoid or delusional. Thought content does not include homicidal or suicidal ideation. Thought content does not include homicidal or suicidal plan.        Cognition and Memory: He exhibits impaired recent memory.        Judgment: Judgment normal.     Comments: Insight fair Memory is better than it was.     Lab Review:     Component Value Date/Time   NA 137 08/21/2016 1303   NA 142 03/20/2014 0514   K 3.5 08/21/2016 1303   K 4.1 03/20/2014 0514   CL 105 08/21/2016 1303   CL 112 (H) 03/20/2014 0514   CO2 23 08/21/2016 1303   CO2 26 03/20/2014 0514   GLUCOSE 124 (H) 08/21/2016 1303   GLUCOSE 99 03/20/2014 0514   BUN 8 08/21/2016 1303   BUN 7 03/20/2014 0514   CREATININE 1.01 08/21/2016 1303   CREATININE 0.78 03/20/2014 0514   CALCIUM 9.8 08/21/2016 1303   CALCIUM 9.5 03/20/2014 0514   PROT 7.8 08/21/2016 1303   PROT 7.3 03/18/2014 1459   ALBUMIN 4.6 08/21/2016 1303   ALBUMIN 3.2 (L) 03/18/2014 1459   AST 24 08/21/2016 1303   AST 16 03/18/2014 1459   ALT 29 08/21/2016 1303   ALT 16 03/18/2014 1459   ALKPHOS 63 08/21/2016 1303   ALKPHOS 105 03/18/2014 1459   BILITOT 1.0 08/21/2016 1303   BILITOT 0.3 03/18/2014 1459   GFRNONAA >60 08/21/2016 1303   GFRNONAA >60  03/20/2014 0514   GFRAA >60 08/21/2016 1303   GFRAA >60 03/20/2014 0514       Component Value Date/Time    WBC 9.2 08/21/2016 1303   RBC 4.67 08/21/2016 1303   HGB 15.1 08/21/2016 1303   HGB 12.6 (L) 03/20/2014 0514   HCT 43.9 08/21/2016 1303   HCT 38.4 (L) 03/20/2014 0514   PLT 413 08/21/2016 1303   PLT 346 03/20/2014 0514   MCV 94.1 08/21/2016 1303   MCV 89 03/20/2014 0514   MCH 32.3 08/21/2016 1303   MCHC 34.4 08/21/2016 1303   RDW 13.2 08/21/2016 1303   RDW 12.5 03/20/2014 0514   LYMPHSABS 3.2 03/20/2014 0514   MONOABS 1.0 03/20/2014 0514   EOSABS 0.4 03/20/2014 0514   BASOSABS 0.1 03/20/2014 0514    Lithium Lvl  Date Value Ref Range Status  08/21/2016 1.00 0.60 - 1.20 mmol/L Final    Last lithium level Sept 0.7. Said he got lithium level at [facility] as requested.  Labs not in Epic.  Recent lipids ok except higher TG than usual.  Normal A1C.  No results found for: PHENYTOIN, PHENOBARB, VALPROATE, CBMZ   .res Assessment: Plan:    Bipolar 1 disorder, mixed, moderate (HCC)  Forgetfulness  Generalized anxiety disorder  Panic disorder with agoraphobia  Insomnia due to mental condition  Lithium use   Chronic TR bipolar mixed and chronic anxiety.  He still has mixed bipolar symptoms which we have not been able to completely eliminate.   See long list of meds tried.  Limits other options.  He is somewhat chronically unstable and recently has been worse because of problems related to benzodiazepines specifically Xanax.  He is cognition does seem better with the switch from alprazolam to lorazepam's so far.  He still having a little bit of sleepiness but also still having a little bit of anxiety.  He believes a lot of the anxiety is related to unresolved emotions over break-up with a girlfriend but he still a little shaky from the switch in medicines.  He has a tendency to get his medications confused but his mother is helping him out with that and I believe he is taking things correctly at this time.  He needs a longer to adjust to the lorazepam with the switch from  alprazolam. Therefore no med changes today  Unfortunately due to the severity of set his symptoms polypharmacy is a necessity.  Sleep hygiene.  Increase activity.  Seroquel is being used to manage his insomnia but also his bipolar disorder.  He will continue the olanzapine in combination with it for additional benefit and for its antianxiety effect in hopes of helping us reduce the lorazepam later  Disc unusual combo antipsychotics but it helps more than other options.  Discussed potential metabolic side effects associated with atypical antipsychotics, as well as potential risk for movement side effects. Advised pt to contact office if movement side effects occur.   Take LED Seroquel to sleep.  No indication for dosage changes.  Will locate lithium level.  This appt was 30 mins.  FU 4 weeks due to his worsening symptoms and confusion.  Meredith Staggersarey Cottle, MD, DFAPA  Future Appointments  Date Time Provider Department Center  09/27/2018  2:15 PM Cottle, Steva Readyarey G Jr., MD [facility] None  12/25/2018 10:45 AM Sondra ComeSninsky, Brian C, MD [facility] None    No orders of the defined types were placed in this encounter.     -------------------------------

## 2018-09-15 ENCOUNTER — Other Ambulatory Visit: Payer: Self-pay | Admitting: Psychiatry

## 2018-09-27 ENCOUNTER — Ambulatory Visit: Payer: Medicare Other | Admitting: Psychiatry

## 2018-09-27 ENCOUNTER — Encounter: Payer: Self-pay | Admitting: Psychiatry

## 2018-09-27 ENCOUNTER — Other Ambulatory Visit: Payer: Self-pay

## 2018-09-27 DIAGNOSIS — R296 Repeated falls: Secondary | ICD-10-CM

## 2018-09-27 DIAGNOSIS — F3162 Bipolar disorder, current episode mixed, moderate: Secondary | ICD-10-CM

## 2018-09-27 DIAGNOSIS — F411 Generalized anxiety disorder: Secondary | ICD-10-CM

## 2018-09-27 DIAGNOSIS — Z79899 Other long term (current) drug therapy: Secondary | ICD-10-CM

## 2018-09-27 DIAGNOSIS — R6889 Other general symptoms and signs: Secondary | ICD-10-CM | POA: Diagnosis not present

## 2018-09-27 DIAGNOSIS — F4001 Agoraphobia with panic disorder: Secondary | ICD-10-CM

## 2018-09-27 NOTE — Progress Notes (Signed)
Albert [name] [id number] 1965-08-31 53 y.o.   Virtual Visit via Telephone Note  I connected with pt by telephone and verified that I am speaking with the correct person using two identifiers.   I discussed the limitations, risks, security and privacy concerns of performing an evaluation and management service by telephone and the availability of in person appointments. I also discussed with the patient that there may be a patient responsible charge related to this service. The patient expressed understanding and agreed to proceed.  I discussed the assessment and treatment plan with the patient. The patient was provided an opportunity to ask questions and all were answered. The patient agreed with the plan and demonstrated an understanding of the instructions.   The patient was advised to call back or seek an in-person evaluation if the symptoms worsen or if the condition fails to improve as anticipated.  I provided 30 minutes of non-face-to-face time during this encounter. The call started at 215 and ended at 50. The patient was located at home and the provider was located office.   Subjective:   Patient ID:  Albert [name] is a 53 y.o. (DOB [birth year]) male.  Chief Complaint:  Chief Complaint  Patient presents with  . Anxiety    Medication Management  . Depression    Medication Management  . Follow-up    Medication Management    Anxiety  Symptoms include decreased concentration and nervous/anxious behavior. Patient reports no confusion or suicidal ideas.    Depression         Associated symptoms include decreased concentration.  Associated symptoms include no suicidal ideas.  Past medical history includes anxiety.     Spoke with patient and [name] presents to the office today for follow-up of mixed bipolar, anxiety and still grieving stress of breakup.  Last visit 08/29/2018.  No meds were changed at that visit because we recently changed from Xanax to Ativan to  hopefully clear his thinking.  He has had more cognitive problems over the last year than usual.  We switched to Ativan  BID and  at noon and HS.   Memory is better than it was but not that good.  Asks what to take for panic, had one a week or 2 ago.  Has had 3 falls lately all at night after asleep for 3 hours or so.  He is fallen 3 times in 3 weeks.  He thinks that is most likely related to the olanzapine just last night turning light on lately has helped but it awakens him.  To bed 10 and up some in the middle of night for a few minutes.  Average is the same 6 hours and not sleepy daytime.   Mother says he's clearer and overall memory is better than when on Xanax.. She still sees anxiety but manageable and fidgety.    Still some memory issues.sleep 5-6 hours nightly.  Depression is moderate. Still episodic, brief, fleeting SI even without pcpt.  Always spends a lot of time just laying around.  Occ rumination and rapid thoughts over exGF who got in another relationship and moved to [facility].  Can have anxiety for no reason and varies in intensity without pattern.  Sleep ok.  Some chronic depression and hyperactivity and loudness and hyperverbal.  Usually back to sleep.  Total 6 hours but some napping.  Still depressed and anxious too.  Still lacks interest and motivation.  Can follow a TV show if interested.  M  reports he's less confusion and seems to be taking meds correctly.  Still has some STM problems.  F still works at [employer].  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  Psych med hx extensive including ECT and risperidone, lithium 1200 SE, Zyprexa 20-15 akathisia, Latuda 80 which caused akathisia, Abilify, Vraylar, Rexulti, aripiprazole 20 mg with akathisia,Seroquel 1000 mg,  InVega, Geodon, lamotrigine 300 mg, Depakote 2000 mg, Saphris with side effects, symbyax,Tegretol,Trileptal and several of these in combinations, Fanapt NR . N-acetylcysteine, Nuedexta,  Belsomra with no response, Viibryd 40  mg for 3 months with diarrhea, protriptyline with side effects, clonidine, Trintellix 20 mg, Parnate 50 mg with no response, gabapentin, imipramine, Lunesta no response, venlafaxine,  Emsam 12 mg for 2 months, trazodone 200 mg,  methylphenidate 60 mg, bupropion was side effects,  Strattera,  Lexapro 20 mg, , modafinil, sertraline, paroxetine, Deplin, pramipexole, Vyvanse, Concerta, amantadine ,Patient prone to akathisia.  Review of Systems:  Review of Systems  Neurological: Positive for tremors. Negative for weakness.       Fidgety  Psychiatric/Behavioral: Positive for decreased concentration and depression. Negative for agitation, behavioral problems, confusion, dysphoric mood, hallucinations, self-injury, sleep disturbance and suicidal ideas. The patient is nervous/anxious and is hyperactive.     Medications: I have reviewed the patient's current medications.  Current Outpatient Medications  Medication Sig Dispense Refill  . esomeprazole (NEXIUM) 20 MG capsule Take by mouth.    . fluticasone (FLONASE) 50 MCG/ACT nasal spray     . levothyroxine (SYNTHROID, LEVOTHROID) 75 MCG tablet Take by mouth.    . lithium carbonate 300 MG capsule TAKE 3 CAPSULES EVERY EVENING 90 capsule 2  . LORazepam (ATIVAN) 2 MG tablet 1  tablet 1 in the morning and 1 tablet at 6 PM, 1/2 tablet at noon and 1/2 tablet at bedtime 90 tablet 1  . meclizine (ANTIVERT) 25 MG tablet Take 1 tablet (25 mg total) by mouth 3 (three) times daily as needed for dizziness. 20 tablet 0  . OLANZapine (ZYPREXA) 10 MG tablet TAKE ONE TABLET BY MOUTH AT BEDTIME 30 tablet 2  . ondansetron (ZOFRAN ODT) 4 MG disintegrating tablet Take 1 tablet (4 mg total) by mouth every 8 (eight) hours as needed for nausea or vomiting. 20 tablet 0  . QUEtiapine (SEROQUEL) 400 MG tablet TAKE TWO TABLETS BY MOUTH AT BEDTIME 60 tablet 2  . sildenafil (REVATIO) 20 MG tablet Take 3-5 tablets (60-100 mg total) by mouth as needed. 30 tablet 11  . tamsulosin  (FLOMAX) 0.4 MG CAPS capsule Take 1 capsule (0.4 mg total) by mouth daily after supper. 30 capsule 11   No current facility-administered medications for this visit.    Medication Side Effects: Other: mild sleepiness.  Allergies:  Allergies  Allergen Reactions  . Meloxicam Other (See Comments)    Other reaction(s): Dizziness  . Prednisone   . Wellbutrin [Bupropion]     Past Medical History:  Diagnosis Date  . Anxiety   . Bipolar disorder (HCC)   . Depression   . GERD (gastroesophageal reflux disease)     Family History  Problem Relation Age of Onset  . Prostate cancer Neg Hx   . Bladder Cancer Neg Hx   . Kidney cancer Neg Hx     Social History   Socioeconomic History  . Marital status: Single    Spouse name: Not on file  . Number of children: Not on file  . Years of education: Not on file  . Highest education level: Not on  file  Occupational History  . Not on file  Social Needs  . Financial resource strain: Not on file  . Food insecurity:    Worry: Not on file    Inability: Not on file  . Transportation needs:    Medical: Not on file    Non-medical: Not on file  Tobacco Use  . Smoking status: Former Games developer  . Smokeless tobacco: Never Used  Substance and Sexual Activity  . Alcohol use: Not Currently  . Drug use: Never  . Sexual activity: Not on file  Lifestyle  . Physical activity:    Days per week: Not on file    Minutes per session: Not on file  . Stress: Not on file  Relationships  . Social connections:    Talks on phone: Not on file    Gets together: Not on file    Attends religious service: Not on file    Active member of club or organization: Not on file    Attends meetings of clubs or organizations: Not on file    Relationship status: Not on file  . Intimate partner violence:    Fear of current or ex partner: Not on file    Emotionally abused: Not on file    Physically abused: Not on file    Forced sexual activity: Not on file  Other  Topics Concern  . Not on file  Social History Narrative  . Not on file    Past Medical History, Surgical history, Social history, and Family history were reviewed and updated as appropriate.   Please see review of systems for further details on the patient's review from today.   Objective:   Physical Exam:  There were no vitals taken for this visit.  Physical Exam Neurological:     Mental Status: He is alert and oriented to person, place, and time.     Cranial Nerves: No dysarthria.  Psychiatric:        Attention and Perception: He does not perceive auditory or visual hallucinations.        Mood and Affect: Mood is anxious and depressed. Affect is not angry or tearful.        Speech: Speech normal. Speech is not slurred.        Behavior: Behavior is not slowed. Behavior is cooperative.        Thought Content: Thought content normal. Thought content is not paranoid or delusional. Thought content does not include homicidal or suicidal ideation. Thought content does not include homicidal or suicidal plan.        Cognition and Memory: He exhibits impaired recent memory.        Judgment: Judgment normal.     Comments: Insight fair Memory is better than it was. He is chronically anxious and somewhat needy. No manic episodes noted.     Lab Review:     Component Value Date/Time   NA 137 08/21/2016 1303   NA 142 03/20/2014 0514   K 3.5 08/21/2016 1303   K 4.1 03/20/2014 0514   CL 105 08/21/2016 1303   CL 112 (H) 03/20/2014 0514   CO2 23 08/21/2016 1303   CO2 26 03/20/2014 0514   GLUCOSE 124 (H) 08/21/2016 1303   GLUCOSE 99 03/20/2014 0514   BUN 8 08/21/2016 1303   BUN 7 03/20/2014 0514   CREATININE 1.01 08/21/2016 1303   CREATININE 0.78 03/20/2014 0514   CALCIUM 9.8 08/21/2016 1303   CALCIUM 9.5 03/20/2014 0514   PROT 7.8 08/21/2016  1303   PROT 7.3 03/18/2014 1459   ALBUMIN 4.6 08/21/2016 1303   ALBUMIN 3.2 (L) 03/18/2014 1459   AST 24 08/21/2016 1303   AST 16  03/18/2014 1459   ALT 29 08/21/2016 1303   ALT 16 03/18/2014 1459   ALKPHOS 63 08/21/2016 1303   ALKPHOS 105 03/18/2014 1459   BILITOT 1.0 08/21/2016 1303   BILITOT 0.3 03/18/2014 1459   GFRNONAA >60 08/21/2016 1303   GFRNONAA >60 03/20/2014 0514   GFRAA >60 08/21/2016 1303   GFRAA >60 03/20/2014 0514       Component Value Date/Time   WBC 9.2 08/21/2016 1303   RBC 4.67 08/21/2016 1303   HGB 15.1 08/21/2016 1303   HGB 12.6 (L) 03/20/2014 0514   HCT 43.9 08/21/2016 1303   HCT 38.4 (L) 03/20/2014 0514   PLT 413 08/21/2016 1303   PLT 346 03/20/2014 0514   MCV 94.1 08/21/2016 1303   MCV 89 03/20/2014 0514   MCH 32.3 08/21/2016 1303   MCHC 34.4 08/21/2016 1303   RDW 13.2 08/21/2016 1303   RDW 12.5 03/20/2014 0514   LYMPHSABS 3.2 03/20/2014 0514   MONOABS 1.0 03/20/2014 0514   EOSABS 0.4 03/20/2014 0514   BASOSABS 0.1 03/20/2014 0514    Lithium Lvl  Date Value Ref Range Status  08/21/2016 1.00 0.60 - 1.20 mmol/L Final    Last lithium level Sept 0.7. Said he got lithium level at [facility] as requested.  Labs not in Epic.  Recent lipids ok except higher TG than usual.  Normal A1C.  No results found for: PHENYTOIN, PHENOBARB, VALPROATE, CBMZ   .res Assessment: Plan:    Bipolar 1 disorder, mixed, moderate (HCC)  Excessive falling  Forgetfulness  Generalized anxiety disorder  Panic disorder with agoraphobia  Lithium use   Chronic TR bipolar mixed and chronic anxiety.  He still has mixed bipolar symptoms which we have not been able to completely eliminate.   See long list of meds tried.  Limits other options.  He is somewhat chronically unstable and recently has been worse because of problems related to benzodiazepines specifically Xanax.  He is cognition does seem better with the switch from alprazolam to lorazepam, per the patient and his mother..  He still having a little bit of sleepiness but also still having a little bit of anxiety. He has a tendency to get his  medications confused but his mother is helping him out with that and I believe he is taking things correctly at this time.  no med changes today  He is going to try turning on the bathroom light and leaving it alone cracking the door to the bathroom so when he gets up out of bed at night he has some light in order to reduce the fall risk.  If he continues to have falling in the next 2 weeks he will call back and we will reduce the olanzapine from 10 mg to 7.5 mg,, although the culprit could be quetiapine and not olanzapine.  But will change one at a time to be able to sort out the cause.  He is not taking that much benzodiazepine at night so it seems unlikely that that would be the cause of the following.  We discussed the short-term risks associated with benzodiazepines including sedation and increased fall risk among others.  Discussed long-term side effect risk including dependence, potential withdrawal symptoms, and the potential eventual dose-related risk of dementia.  Unfortunately due to the severity of set his symptoms polypharmacy is  a necessity.  Sleep hygiene.  Increase activity.  Seroquel is being used to manage his insomnia but also his bipolar disorder.  He will continue the olanzapine in combination with it for additional benefit and for its antianxiety effect in hopes of helping [place] reduce the lorazepam later  Disc unusual combo antipsychotics but it helps more than other options.  Discussed potential metabolic side effects associated with atypical antipsychotics, as well as potential risk for movement side effects. Advised pt to contact office if movement side effects occur.   Take LED Seroquel to sleep.  No indication for dosage changes.  Will locate lithium level. Have sent note to staff.  Lately when ordering labs and lab core through epic the results are not coming back.  Problem needs to be solved.  This appt was 30 mins.  FU 4 weeks   Meredith Staggers, MD, DFAPA  Future  Appointments  Date Time Provider Department Center  10/25/2018  1:30 PM Cottle, Steva Ready., MD [facility] None  12/28/2018  9:45 AM Sondra Come, MD [facility] None    No orders of the defined types were placed in this encounter.     -------------------------------

## 2018-09-27 NOTE — Telephone Encounter (Signed)
Has appt today, asking for refills to be put on file

## 2018-10-02 ENCOUNTER — Other Ambulatory Visit: Payer: Self-pay | Admitting: Psychiatry

## 2018-10-02 ENCOUNTER — Telehealth: Payer: Self-pay | Admitting: Psychiatry

## 2018-10-02 DIAGNOSIS — R3914 Feeling of incomplete bladder emptying: Secondary | ICD-10-CM

## 2018-10-02 DIAGNOSIS — F3162 Bipolar disorder, current episode mixed, moderate: Secondary | ICD-10-CM

## 2018-10-02 DIAGNOSIS — N401 Enlarged prostate with lower urinary tract symptoms: Secondary | ICD-10-CM

## 2018-10-02 NOTE — Progress Notes (Signed)
Serum lithium standing order sent to lab [facility]

## 2018-10-02 NOTE — Telephone Encounter (Addendum)
Pt stated he was falling around in the house and woke up the other day disoriented. Mentioned to you on phone visit 5/27. If continued you would decrease mg from 10 to 5 on Zyprexa. Please advise

## 2018-10-02 NOTE — Telephone Encounter (Signed)
Last lithium level July 27, 2018 was normal at 1.0.  However given these worsening symptoms of balance, please get a lithium level as soon as possible.  He usually goes to [facility].  We have had some problems getting epic orders to go through to lab core.  Hopefully this will go through.  Also please reduce olanzapine to 5 mg every evening.

## 2018-10-02 NOTE — Telephone Encounter (Signed)
He's aware to reduce his zyprexa to 5 mg and get his labs drawn tomorrow. Verbalized we would call with results as soon as we get them.Yes he uses [facility].

## 2018-10-25 ENCOUNTER — Other Ambulatory Visit: Payer: Self-pay

## 2018-10-25 ENCOUNTER — Ambulatory Visit: Payer: Medicare Other | Admitting: Psychiatry

## 2018-10-25 ENCOUNTER — Encounter: Payer: Self-pay | Admitting: Psychiatry

## 2018-10-25 VITALS — Wt 190.0 lb

## 2018-10-25 DIAGNOSIS — F411 Generalized anxiety disorder: Secondary | ICD-10-CM

## 2018-10-25 DIAGNOSIS — R296 Repeated falls: Secondary | ICD-10-CM

## 2018-10-25 DIAGNOSIS — F3162 Bipolar disorder, current episode mixed, moderate: Secondary | ICD-10-CM

## 2018-10-25 DIAGNOSIS — R6889 Other general symptoms and signs: Secondary | ICD-10-CM | POA: Diagnosis not present

## 2018-10-25 MED ORDER — QUETIAPINE FUMARATE 400 MG PO TABS: ORAL_TABLET | ORAL | 2 refills | Status: DC

## 2018-10-25 MED ORDER — OLANZAPINE 7.5 MG PO TABS: 7.5000 mg | ORAL_TABLET | Freq: Every day | ORAL | 2 refills | Status: DC

## 2018-10-25 MED ORDER — LITHIUM CARBONATE 150 MG PO CAPS: 150.0000 mg | ORAL_CAPSULE | Freq: Every day | ORAL | 0 refills | Status: DC

## 2018-10-25 NOTE — Progress Notes (Signed)
Albert [name] [id number] 1965-09-11 53 y.o.    Subjective:   Patient ID:  Albert [name] is a 53 y.o. (DOB 1965-09-11) male.  Chief Complaint:  Chief Complaint  Patient presents with  . Depression  . Follow-up    TR bipolar    Anxiety Symptoms include decreased concentration and nervous/anxious behavior. Patient reports no confusion or suicidal ideas.    Depression        Associated symptoms include decreased concentration.  Associated symptoms include no suicidal ideas.  Past medical history includes anxiety.     Albert [name] presents to the office today for follow-up of mixed bipolar, anxiety and still grieving stress of breakup.  Last visit in May 2020. No med changes.  Wants to increase olanzapine back to 7.5 mg bc not sleeping as well at 5 mg.  No sleepwalking nor falling nor odd behavior.  No history of sleep walking.  He understands this may recur with an increase.  Also wants option to rarely take extra quetiapine 400 for sleep.  No falls lately like before.  To sleep 11 and up with nocturia.  May night eat some.  Still some memory issues.sleep 5-6 hours nightly.  Depression is moderate. Still episodic, brief, fleeting SI even without pcpt.  Always spends a lot of time just laying around.  Occ rumination and rapid thoughts over exGF who got in another [place]  Can have anxiety for no reason and varies in intensity without pattern.  Sleep ok.  Some chronic depression and hyperactivity and loudness and hyperverbal.  Usually back to sleep.  Total 6 hours but some napping.  Still depressed and anxious too.  Still lacks interest and motivation.  Can follow a TV show if interested.  M reports he's less confusion and seems to be taking meds correctly.  Still has some STM problems.  F still works at [employer]71yo.  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  Psych med hx extensive including ECT and risperidone, lithium 1200 SE, Zyprexa 20-15 akathisia, Latuda 80 which caused  akathisia, Abilify, Vraylar, Rexulti, aripiprazole 20 mg with akathisia,Seroquel 1000 mg,  InVega, Geodon, lamotrigine 300 mg, Depakote 2000 mg, Saphris with side effects, symbyax,Tegretol,Trileptal and several of these in combinations, Fanapt NR . N-acetylcysteine, Nuedexta,  Belsomra with no response, Viibryd 40 mg for 3 months with diarrhea, protriptyline with side effects, clonidine, Trintellix 20 mg, Parnate 50 mg with no response, gabapentin, imipramine, Lunesta no response, venlafaxine,  Emsam 12 mg for 2 months, trazodone 200 mg,  methylphenidate 60 mg, bupropion was side effects,  Strattera,  Lexapro 20 mg, , modafinil, sertraline, paroxetine, Deplin, pramipexole, Vyvanse, Concerta, amantadine ,Patient prone to akathisia.  Review of Systems:  Review of Systems  Neurological: Positive for tremors. Negative for weakness.       Fidgety  Psychiatric/Behavioral: Positive for decreased concentration and depression. Negative for agitation, behavioral problems, confusion, dysphoric mood, hallucinations, self-injury, sleep disturbance and suicidal ideas. The patient is nervous/anxious and is hyperactive.     Medications: I have reviewed the patient's current medications.  Current Outpatient Medications  Medication Sig Dispense Refill  . esomeprazole (NEXIUM) 20 MG capsule Take by mouth.    . fluticasone (FLONASE) 50 MCG/ACT nasal spray     . levothyroxine (SYNTHROID, LEVOTHROID) 75 MCG tablet Take by mouth.    . lithium carbonate 300 MG capsule TAKE 3 CAPSULES EVERY EVENING 90 capsule 2  . LORazepam (ATIVAN) 2 MG tablet TAKE ONE TABLET BY MOUTH EVERY  MORNING 1/2 TAB AT NOON  1 AT 6:00 P.M. AND 1/2 TAB AT AT BEDTIME 90 tablet 1  . meclizine (ANTIVERT) 25 MG tablet Take 1 tablet (25 mg total) by mouth 3 (three) times daily as needed for dizziness. 20 tablet 0  . OLANZapine (ZYPREXA) 5 MG tablet Take 1 tablet (5 mg total) by mouth at bedtime. 30 tablet 2  . ondansetron (ZOFRAN ODT) 4 MG  disintegrating tablet Take 1 tablet (4 mg total) by mouth every 8 (eight) hours as needed for nausea or vomiting. 20 tablet 0  . QUEtiapine (SEROQUEL) 400 MG tablet TAKE TWO TABLETS AT BEDTIME 60 tablet 2  . sildenafil (REVATIO) 20 MG tablet Take 3-5 tablets (60-100 mg total) by mouth as needed. 30 tablet 11  . tamsulosin (FLOMAX) 0.4 MG CAPS capsule Take 1 capsule (0.4 mg total) by mouth daily after supper. 30 capsule 11   No current facility-administered medications for this visit.    Medication Side Effects: Other: mild sleepiness.  Occ twitches.  Allergies:  Allergies  Allergen Reactions  . Meloxicam Other (See Comments)    Other reaction(s): Dizziness  . Prednisone   . Wellbutrin [Bupropion]     Past Medical History:  Diagnosis Date  . Anxiety   . Bipolar disorder (HCC)   . Depression   . GERD (gastroesophageal reflux disease)     Family History  Problem Relation Age of Onset  . Prostate cancer Neg Hx   . Bladder Cancer Neg Hx   . Kidney cancer Neg Hx     Social History   Socioeconomic History  . Marital status: Single    Spouse name: Not on file  . Number of children: Not on file  . Years of education: Not on file  . Highest education level: Not on file  Occupational History  . Not on file  Social Needs  . Financial resource strain: Not on file  . Food insecurity    Worry: Not on file    Inability: Not on file  . Transportation needs    Medical: Not on file    Non-medical: Not on file  Tobacco Use  . Smoking status: Former Games developermoker  . Smokeless tobacco: Never Used  Substance and Sexual Activity  . Alcohol use: Not Currently  . Drug use: Never  . Sexual activity: Not on file  Lifestyle  . Physical activity    Days per week: Not on file    Minutes per session: Not on file  . Stress: Not on file  Relationships  . Social Musicianconnections    Talks on phone: Not on file    Gets together: Not on file    Attends religious service: Not on file    Active  member of club or organization: Not on file    Attends meetings of clubs or organizations: Not on file    Relationship status: Not on file  . Intimate partner violence    Fear of current or ex partner: Not on file    Emotionally abused: Not on file    Physically abused: Not on file    Forced sexual activity: Not on file  Other Topics Concern  . Not on file  Social History Narrative  . Not on file    Past Medical History, Surgical history, Social history, and Family history were reviewed and updated as appropriate.   Please see review of systems for further details on the patient's review from today.   Objective:   Physical Exam:  Wt 190 lb (86.2 kg)   BMI 28.06 kg/m   Physical Exam Neurological:     Mental Status: He is alert and oriented to person, place, and time.     Cranial Nerves: No dysarthria.  Psychiatric:        Attention and Perception: He does not perceive auditory or visual hallucinations.        Mood and Affect: Mood is anxious and depressed. Affect is not angry or tearful.        Speech: Speech normal. Speech is not slurred.        Behavior: Behavior is not slowed. Behavior is cooperative.        Thought Content: Thought content normal. Thought content is not paranoid or delusional. Thought content does not include homicidal or suicidal ideation. Thought content does not include homicidal or suicidal plan.        Cognition and Memory: He exhibits impaired recent memory.        Judgment: Judgment normal.     Comments: Insight fair Memory is better than it was. He is chronically anxious and somewhat needy. No manic episodes noted.     Lab Review:     Component Value Date/Time   NA 137 08/21/2016 1303   NA 142 03/20/2014 0514   K 3.5 08/21/2016 1303   K 4.1 03/20/2014 0514   CL 105 08/21/2016 1303   CL 112 (H) 03/20/2014 0514   CO2 23 08/21/2016 1303   CO2 26 03/20/2014 0514   GLUCOSE 124 (H) 08/21/2016 1303   GLUCOSE 99 03/20/2014 0514   BUN 8  08/21/2016 1303   BUN 7 03/20/2014 0514   CREATININE 1.01 08/21/2016 1303   CREATININE 0.78 03/20/2014 0514   CALCIUM 9.8 08/21/2016 1303   CALCIUM 9.5 03/20/2014 0514   PROT 7.8 08/21/2016 1303   PROT 7.3 03/18/2014 1459   ALBUMIN 4.6 08/21/2016 1303   ALBUMIN 3.2 (L) 03/18/2014 1459   AST 24 08/21/2016 1303   AST 16 03/18/2014 1459   ALT 29 08/21/2016 1303   ALT 16 03/18/2014 1459   ALKPHOS 63 08/21/2016 1303   ALKPHOS 105 03/18/2014 1459   BILITOT 1.0 08/21/2016 1303   BILITOT 0.3 03/18/2014 1459   GFRNONAA >60 08/21/2016 1303   GFRNONAA >60 03/20/2014 0514   GFRAA >60 08/21/2016 1303   GFRAA >60 03/20/2014 0514       Component Value Date/Time   WBC 9.2 08/21/2016 1303   RBC 4.67 08/21/2016 1303   HGB 15.1 08/21/2016 1303   HGB 12.6 (L) 03/20/2014 0514   HCT 43.9 08/21/2016 1303   HCT 38.4 (L) 03/20/2014 0514   PLT 413 08/21/2016 1303   PLT 346 03/20/2014 0514   MCV 94.1 08/21/2016 1303   MCV 89 03/20/2014 0514   MCH 32.3 08/21/2016 1303   MCHC 34.4 08/21/2016 1303   RDW 13.2 08/21/2016 1303   RDW 12.5 03/20/2014 0514   LYMPHSABS 3.2 03/20/2014 0514   MONOABS 1.0 03/20/2014 0514   EOSABS 0.4 03/20/2014 0514   BASOSABS 0.1 03/20/2014 0514    Lithium Lvl  Date Value Ref Range Status  08/21/2016 1.00 0.60 - 1.20 mmol/L Final    Last lithium level Sept 0.7.   Last lithium level July 27, 2018 was normal at 1.0.   Lithium level [facility] October 03, 2018 = 1.2. Said he got lithium level at [facility] as requested.  Labs not in Epic.  Recent lipids ok except higher TG than usual.  Normal A1C.  No  results found for: PHENYTOIN, PHENOBARB, VALPROATE, CBMZ   .res Assessment: Plan:    Albert [name] was seen today for depression and follow-up.  Diagnoses and all orders for this visit:  Bipolar 1 disorder, mixed, moderate (HCC) -     OLANZapine (ZYPREXA) 7.5 MG tablet; Take 1 tablet (7.5 mg total) by mouth at bedtime. -     QUEtiapine (SEROQUEL) 400 MG tablet; 2 each night and  may take 1 extra for insomnia if needed -     lithium carbonate 150 MG capsule; Take 1 capsule (150 mg total) by mouth at bedtime.  Generalized anxiety disorder  Forgetfulness  Excessive falling  Resolved   Chronic TR bipolar mixed and chronic anxiety.  He still has mixed bipolar symptoms which we have not been able to completely eliminate.   See long list of meds tried.  Limits other options.  He is somewhat chronically unstable and recently has been worse because of problems related to benzodiazepines specifically Xanax.  He is cognition does seem better with the switch from alprazolam to lorazepam, per the patient and his mother..  He still having a little bit of sleepiness but also still having a little bit of anxiety. He has a tendency to get his medications confused but his mother is helping him out with that and I believe he is taking things correctly at this time. We discussed the short-term risks associated with benzodiazepines including sedation and increased fall risk among others.  Discussed long-term side effect risk including dependence, potential withdrawal symptoms, and the potential eventual dose-related risk of dementia.  Unfortunately due to the severity of set his symptoms polypharmacy is a necessity.  Sleep hygiene.  "sleep is very very important to me".  Increase activity.  Seroquel is being used to manage his insomnia but also his bipolar disorder.  He will continue the olanzapine in combination with it for additional benefit and for its antianxiety effect in hopes of helping us reduce the lorazepam later No indication for dosage changes except per his request increase olanzapine to 7.5 mg HS Disc unusual combo antipsychotics but it helps more than other options.  Discussed potential metabolic side effects associated with atypical antipsychotics, as well as potential risk for movement side effects. Advised pt to contact office if movement side effects occur.   Take LED  Seroquel to sleep.  He asks to change Rx to 3 of the 400 mg tablets so he can take an extra prn insomnia.  Lithium levels creeping up. This is probably causing the extra muscle twitches.  Reduce lithium to 2 of the 300 mg capsules + 1 of the 150 mg capsules. Have sent note to staff.  Lately when ordering labs and lab core through epic the results are not coming back.  Problem needs to be solved.  Ongoing unresolved. Counseled patient regarding potential benefits, risks, and side effects of lithium to include potential risk of lithium affecting thyroid and renal function.  Discussed need for periodic lab monitoring to determine drug level and to assess for potential adverse effects.  Counseled patient regarding signs and symptoms of lithium toxicity and advised that they notify office immediately or seek urgent medical attention if experiencing these signs and symptoms.  Patient advised to contact office with any questions or concerns.  Falling resolved.  This appt was 30 mins.  FU 6-8 weeks   Meredith Staggersarey Cottle, MD, DFAPA  Future Appointments  Date Time Provider Department Center  12/28/2018  9:45 AM Sondra ComeSninsky, Brian C, MD [facility]  None    No orders of the defined types were placed in this encounter.     -------------------------------

## 2018-10-25 NOTE — Patient Instructions (Addendum)
increase olanzapine to 7.5 mg HS  Reduce lithium to 2 of the 300 mg capsules + 1 of the 150 mg capsules to reduce tremor and muscle twitches.

## 2018-10-27 ENCOUNTER — Telehealth: Payer: Self-pay

## 2018-10-27 NOTE — Telephone Encounter (Signed)
Prior authorization submitted for Quetiapine 400 mg 3 tablets per day DENIED by [facility] limit is 2/day. Need documentation that justify's such a high dose from manufacturing information or support indication for usage of dose.

## 2018-10-27 NOTE — Telephone Encounter (Signed)
This is not an urgent matter but I would suggest we just change the prescription to quetiapine 400 mg tablets 2 nightly and then write a separate prescription for quetiapine 300 mg 1 nightly as needed for severe insomnia.  That should go through without requiring a PA

## 2018-10-30 ENCOUNTER — Other Ambulatory Visit: Payer: Self-pay

## 2018-10-30 DIAGNOSIS — F3162 Bipolar disorder, current episode mixed, moderate: Secondary | ICD-10-CM

## 2018-10-30 MED ORDER — QUETIAPINE FUMARATE 300 MG PO TABS: ORAL_TABLET | ORAL | 2 refills | Status: DC

## 2018-10-30 MED ORDER — QUETIAPINE FUMARATE 400 MG PO TABS: ORAL_TABLET | ORAL | 2 refills | Status: DC

## 2018-10-30 NOTE — Telephone Encounter (Signed)
Updated rx's submitted to [facility]

## 2018-11-24 ENCOUNTER — Other Ambulatory Visit: Payer: Self-pay | Admitting: Psychiatry

## 2018-11-24 DIAGNOSIS — F3162 Bipolar disorder, current episode mixed, moderate: Secondary | ICD-10-CM

## 2018-11-24 NOTE — Telephone Encounter (Signed)
Has appt 08/24

## 2018-12-22 ENCOUNTER — Other Ambulatory Visit: Payer: Self-pay | Admitting: Psychiatry

## 2018-12-22 DIAGNOSIS — F3162 Bipolar disorder, current episode mixed, moderate: Secondary | ICD-10-CM

## 2018-12-25 ENCOUNTER — Other Ambulatory Visit: Payer: Self-pay

## 2018-12-25 ENCOUNTER — Encounter: Payer: Self-pay | Admitting: Psychiatry

## 2018-12-25 ENCOUNTER — Ambulatory Visit (INDEPENDENT_AMBULATORY_CARE_PROVIDER_SITE_OTHER): Payer: Medicare Other | Admitting: Psychiatry

## 2018-12-25 ENCOUNTER — Ambulatory Visit: Payer: Medicare Other | Admitting: Urology

## 2018-12-25 DIAGNOSIS — F3162 Bipolar disorder, current episode mixed, moderate: Secondary | ICD-10-CM

## 2018-12-25 DIAGNOSIS — R6889 Other general symptoms and signs: Secondary | ICD-10-CM | POA: Diagnosis not present

## 2018-12-25 DIAGNOSIS — F4001 Agoraphobia with panic disorder: Secondary | ICD-10-CM

## 2018-12-25 DIAGNOSIS — F411 Generalized anxiety disorder: Secondary | ICD-10-CM

## 2018-12-25 DIAGNOSIS — Z79899 Other long term (current) drug therapy: Secondary | ICD-10-CM

## 2018-12-25 DIAGNOSIS — F5105 Insomnia due to other mental disorder: Secondary | ICD-10-CM | POA: Diagnosis not present

## 2018-12-25 NOTE — Progress Notes (Signed)
Albert MohsLee A Bouza [id number] 11/05/1965 53 y.o.    Subjective:   Patient ID:  Albert [name] is a 53 y.o. (DOB 11/05/1965) male.  Chief Complaint:  Chief Complaint  Patient presents with  . Sleeping Problem  . Depression  . Anxiety  . Follow-up    med changes    Depression        Associated symptoms include decreased concentration.  Associated symptoms include no suicidal ideas.  Past medical history includes anxiety.   Anxiety Symptoms include decreased concentration and nervous/anxious behavior. Patient reports no confusion or suicidal ideas.      Albert MohsLee A Vestal presents to the office today for follow-up of mixed bipolar, anxiety and still grieving stress of breakup.  He requires frequent follow-up because of long-term symptoms.  Last visit October 25, 2018.  We made several medicine changes because of his concerns about sleep and other issues.  We increased olanzapine back to 7.5 mg bc not sleeping as well at 5 mg.  He has to have the prescription for quetiapine written for up to 3 tablets at night in case insomnia is worse because insomnia makes his mood disorder so much worse.  We discussed that was above the usual max but the request was granted given his treatment resistant status. We also reduce lithium from 900 mg daily to 750 mg daily to try to reduce tremor and muscle twitches.  Last night poor sleep.  Seroquel less effective and concerned trouble falling and staying asleep.  Not markedly manic.  Irregular sleep pattern with napping.  Poor sleep this month.  3-4 times had night sweats.   Dleep better if not obsessing on exGF.  CO obsessive thoughts about broken relationship and has been doing this for months.  Can't let it go.  Loop.  Wonders if could try OCD med.  But wonders abt SE of extra meds too.  Wonders about more med trials for mood.    No sleepwalking nor falling nor odd behavior.  No history of sleep walking.  He understands this may recur with an increase.  Also wants option  to rarely take extra quetiapine 300 for sleep.  Still some memory issues.sleep 5-6 hours nightly.  Depression is moderate. Still episodic, brief, fleeting SI even without pcpt.  Always spends a lot of time just laying around.  Occ rumination and rapid thoughts over exGF who got in another relationship and [place] [facility] [place]  Can have anxiety for no reason like coming here, and varies in intensity without pattern.  Less panic than in hte past.  Some chronic depression and hyperactivity and loudness and hyperverbal.  Usually back to sleep.  Total 6 hours but some napping.  Still depressed and anxious too.  Still lacks interest and motivation.  Can follow a TV show if interested.  M reports he's less confusion and seems to be taking meds correctly.  Still has some STM problems.  F still works at [employer]71yo.  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  Psych med hx extensive including ECT and risperidone, lithium 1200 SE, Zyprexa 20-15 akathisia, Latuda 80 which caused akathisia, Abilify, Vraylar, Rexulti, aripiprazole 20 mg with akathisia,Seroquel 1000 mg,  InVega, Geodon, lamotrigine 300 mg, Depakote 2000 mg, Saphris with side effects, symbyax,Tegretol,Trileptal and several of these in combinations, Fanapt NR . N-acetylcysteine, Nuedexta,  Belsomra with no response, Viibryd 40 mg for 3 months with diarrhea, protriptyline with side effects, clonidine, Trintellix 20 mg, Parnate 50 mg with no response, gabapentin, imipramine, Alfonso PattenLunesta  no response, venlafaxine,  Emsam 12 mg for 2 months, trazodone 200 mg,  methylphenidate 60 mg, bupropion was side effects,  Strattera,  Lexapro 20 mg, , modafinil, sertraline, paroxetine, Deplin, pramipexole, Vyvanse, Concerta, amantadine ,Patient prone to akathisia. Xanax, clonazepam.  Review of Systems:  Review of Systems  Neurological: Positive for tremors. Negative for weakness.       Fidgety  Psychiatric/Behavioral: Positive for decreased concentration and depression. Negative for  agitation, behavioral problems, confusion, dysphoric mood, hallucinations, self-injury, sleep disturbance and suicidal ideas. The patient is nervous/anxious and is hyperactive.     Medications: I have reviewed the patient's current medications.  Current Outpatient Medications  Medication Sig Dispense Refill  . esomeprazole (NEXIUM) 20 MG capsule Take by mouth.    . fluticasone (FLONASE) 50 MCG/ACT nasal spray     . levothyroxine (SYNTHROID, LEVOTHROID) 75 MCG tablet Take by mouth.    . lithium carbonate 150 MG capsule TAKE 1 CAPSULE AT BEDTIME 90 capsule 0  . lithium carbonate 300 MG capsule Take 2 capsules (600 mg total) by mouth daily. 180 capsule 1  . LORazepam (ATIVAN) 2 MG tablet TAKE ONE TABLET EACH MORNING, 1/2 TABLETAT NOON, ONE TABLET AT 6PM AND 1/2 TABLET AT BEDTIME 90 tablet 1  . meclizine (ANTIVERT) 25 MG tablet Take 1 tablet (25 mg total) by mouth 3 (three) times daily as needed for dizziness. 20 tablet 0  . OLANZapine (ZYPREXA) 7.5 MG tablet TAKE ONE TABLET AT BEDTIME. 30 tablet 2  . ondansetron (ZOFRAN ODT) 4 MG disintegrating tablet Take 1 tablet (4 mg total) by mouth every 8 (eight) hours as needed for nausea or vomiting. 20 tablet 0  . QUEtiapine (SEROQUEL) 300 MG tablet TAKE 1 TABLET AT BEDTIME AS NEEDED FOR SEVERE INSOMNIA 30 tablet 2  . QUEtiapine (SEROQUEL) 400 MG tablet 2 each night 60 tablet 2  . sildenafil (REVATIO) 20 MG tablet Take 3-5 tablets (60-100 mg total) by mouth as needed. 30 tablet 11  . tamsulosin (FLOMAX) 0.4 MG CAPS capsule Take 1 capsule (0.4 mg total) by mouth daily after supper. 30 capsule 11   No current facility-administered medications for this visit.    Medication Side Effects: Other: mild sleepiness.  Occ twitches.  Allergies:  Allergies  Allergen Reactions  . Meloxicam Other (See Comments)    Other reaction(s): Dizziness  . Prednisone   . Wellbutrin [Bupropion]     Past Medical History:  Diagnosis Date  . Anxiety   . Bipolar  disorder (HCC)   . Depression   . GERD (gastroesophageal reflux disease)     Family History  Problem Relation Age of Onset  . Prostate cancer Neg Hx   . Bladder Cancer Neg Hx   . Kidney cancer Neg Hx     Social History   Socioeconomic History  . Marital status: Single    Spouse name: Not on file  . Number of children: Not on file  . Years of education: Not on file  . Highest education level: Not on file  Occupational History  . Not on file  Social Needs  . Financial resource strain: Not on file  . Food insecurity    Worry: Not on file    Inability: Not on file  . Transportation needs    Medical: Not on file    Non-medical: Not on file  Tobacco Use  . Smoking status: Former Games developer  . Smokeless tobacco: Never Used  Substance and Sexual Activity  . Alcohol use: Not Currently  .  Drug use: Never  . Sexual activity: Not on file  Lifestyle  . Physical activity    Days per week: Not on file    Minutes per session: Not on file  . Stress: Not on file  Relationships  . Social Musicianconnections    Talks on phone: Not on file    Gets together: Not on file    Attends religious service: Not on file    Active member of club or organization: Not on file    Attends meetings of clubs or organizations: Not on file    Relationship status: Not on file  . Intimate partner violence    Fear of current or ex partner: Not on file    Emotionally abused: Not on file    Physically abused: Not on file    Forced sexual activity: Not on file  Other Topics Concern  . Not on file  Social History Narrative  . Not on file    Past Medical History, Surgical history, Social history, and Family history were reviewed and updated as appropriate.   Please see review of systems for further details on the patient's review from today.   Objective:   Physical Exam:  There were no vitals taken for this visit.  Physical Exam Neurological:     Mental Status: He is alert and oriented to person, place,  and time.     Cranial Nerves: No dysarthria.  Psychiatric:        Attention and Perception: He does not perceive auditory or visual hallucinations.        Mood and Affect: Mood is anxious and depressed. Affect is not angry or tearful.        Speech: Speech normal. Speech is not slurred.        Behavior: Behavior is not slowed. Behavior is cooperative.        Thought Content: Thought content normal. Thought content is not paranoid or delusional. Thought content does not include homicidal or suicidal ideation. Thought content does not include homicidal or suicidal plan.        Cognition and Memory: He exhibits impaired recent memory.        Judgment: Judgment normal.     Comments: Insight fair Memory is better than it was. He is chronically anxious and somewhat needy. No manic episodes noted.     Lab Review:     Component Value Date/Time   NA 137 08/21/2016 1303   NA 142 03/20/2014 0514   K 3.5 08/21/2016 1303   K 4.1 03/20/2014 0514   CL 105 08/21/2016 1303   CL 112 (H) 03/20/2014 0514   CO2 23 08/21/2016 1303   CO2 26 03/20/2014 0514   GLUCOSE 124 (H) 08/21/2016 1303   GLUCOSE 99 03/20/2014 0514   BUN 8 08/21/2016 1303   BUN 7 03/20/2014 0514   CREATININE 1.01 08/21/2016 1303   CREATININE 0.78 03/20/2014 0514   CALCIUM 9.8 08/21/2016 1303   CALCIUM 9.5 03/20/2014 0514   PROT 7.8 08/21/2016 1303   PROT 7.3 03/18/2014 1459   ALBUMIN 4.6 08/21/2016 1303   ALBUMIN 3.2 (L) 03/18/2014 1459   AST 24 08/21/2016 1303   AST 16 03/18/2014 1459   ALT 29 08/21/2016 1303   ALT 16 03/18/2014 1459   ALKPHOS 63 08/21/2016 1303   ALKPHOS 105 03/18/2014 1459   BILITOT 1.0 08/21/2016 1303   BILITOT 0.3 03/18/2014 1459   GFRNONAA >60 08/21/2016 1303   GFRNONAA >60 03/20/2014 0514   GFRAA >60  08/21/2016 1303   GFRAA >60 03/20/2014 0514       Component Value Date/Time   WBC 9.2 08/21/2016 1303   RBC 4.67 08/21/2016 1303   HGB 15.1 08/21/2016 1303   HGB 12.6 (L) 03/20/2014 0514    HCT 43.9 08/21/2016 1303   HCT 38.4 (L) 03/20/2014 0514   PLT 413 08/21/2016 1303   PLT 346 03/20/2014 0514   MCV 94.1 08/21/2016 1303   MCV 89 03/20/2014 0514   MCH 32.3 08/21/2016 1303   MCHC 34.4 08/21/2016 1303   RDW 13.2 08/21/2016 1303   RDW 12.5 03/20/2014 0514   LYMPHSABS 3.2 03/20/2014 0514   MONOABS 1.0 03/20/2014 0514   EOSABS 0.4 03/20/2014 0514   BASOSABS 0.1 03/20/2014 0514    Lithium Lvl  Date Value Ref Range Status  08/21/2016 1.00 0.60 - 1.20 mmol/L Final    Last lithium level Sept 0.7.   Last lithium level July 27, 2018 was normal at 1.0.   Lithium level [facility] October 03, 2018 = 1.2. Said he got lithium level at [facility] as requested.  Labs not in Epic.  Recent lipids ok except higher TG than usual.  Normal A1C.  No results found for: PHENYTOIN, PHENOBARB, VALPROATE, CBMZ   .res Assessment: Plan:    Brad was seen today for sleeping problem, depression, anxiety and follow-up.  Diagnoses and all orders for this visit:  Bipolar 1 disorder, mixed, moderate ([place])  Insomnia due to mental condition  Generalized anxiety disorder  Forgetfulness  Lithium use  Panic disorder with agoraphobia     Chronic TR bipolar mixed and chronic anxiety.  He still has mixed bipolar symptoms which we have not been able to completely eliminate.   See long list of meds tried.  Limits other options.  He is somewhat chronically unstable and recently has been worse because of problems related to benzodiazepines specifically Xanax.  Consider fluvoxamine but problems with DDI with olanzapine so will defer.  He is cognition does seem better with the switch from alprazolam to lorazepam, per the patient and his mother..  He still having a little bit of sleepiness but also still having a little bit of anxiety. He has a tendency to get his medications confused but his mother is helping him out with that and I believe he is taking things correctly at this time. We discussed the  short-term risks associated with benzodiazepines including sedation and increased fall risk among others.  Discussed long-term side effect risk including dependence, potential withdrawal symptoms, and the potential eventual dose-related risk of dementia. He wants to consider switching all daily lorazepam into 3 divided doses instead of 4.   Disc risks and keep it spread out.  Unfortunately due to the severity of set his symptoms polypharmacy is a necessity.  Answered his questions about any new psychiatric meds.  We discussed Caplyta but it is not FDA approved for bipolar disorder at this time.  Sleep hygiene.  "sleep is very very important to me".  Increase activity.  Reduce temperature bc he's got it set on 76F.  Seroquel is being used to manage his insomnia but also his bipolar disorder.  He will continue the olanzapine in combination with it for additional benefit and for its antianxiety effect in hopes of helping [place] reduce the lorazepam later  No indication for dosage change olanzapine to 7.5 mg HS Disc unusual combo antipsychotics but it helps more than other options.  Discussed potential metabolic side effects associated with atypical antipsychotics, as  well as potential risk for movement side effects. Advised pt to contact office if movement side effects occur.   Take LED Seroquel to sleep.   No obvious alternatives for sleep  Continue lithium to 2 of the 300 mg capsules + 1 of the 150 mg capsules.  Counseled patient regarding potential benefits, risks, and side effects of lithium to include potential risk of lithium affecting thyroid and renal function.  Discussed need for periodic lab monitoring to determine drug level and to assess for potential adverse effects.  Counseled patient regarding signs and symptoms of lithium toxicity and advised that they notify office immediately or seek urgent medical attention if experiencing these signs and symptoms.  Patient advised to contact office with  any questions or concerns.  Check lithium level.  Since the reduction in dosage and last level 6 mos ago.  This appt was 30 mins.  FU 6-8 weeks   Meredith Staggersarey Cottle, MD, DFAPA  Future Appointments  Date Time Provider Department Center  12/28/2018  9:45 AM Sondra ComeSninsky, Brian C, MD [facility] None    No orders of the defined types were placed in this encounter.     -------------------------------

## 2018-12-25 NOTE — Patient Instructions (Addendum)
Ideal sleep temperature is 65 degrees  Check lithium level  Okay to split lorazepam as 1 whole tablet 3 times daily

## 2018-12-28 ENCOUNTER — Telehealth: Payer: Self-pay | Admitting: Urology

## 2018-12-28 ENCOUNTER — Other Ambulatory Visit: Payer: Self-pay

## 2018-12-28 ENCOUNTER — Telehealth (INDEPENDENT_AMBULATORY_CARE_PROVIDER_SITE_OTHER): Payer: Medicare Other | Admitting: Urology

## 2018-12-28 DIAGNOSIS — Z125 Encounter for screening for malignant neoplasm of prostate: Secondary | ICD-10-CM

## 2018-12-28 DIAGNOSIS — N401 Enlarged prostate with lower urinary tract symptoms: Secondary | ICD-10-CM | POA: Diagnosis not present

## 2018-12-28 DIAGNOSIS — R3914 Feeling of incomplete bladder emptying: Secondary | ICD-10-CM

## 2018-12-28 DIAGNOSIS — N529 Male erectile dysfunction, unspecified: Secondary | ICD-10-CM | POA: Diagnosis not present

## 2018-12-28 MED ORDER — TAMSULOSIN HCL 0.4 MG PO CAPS: 0.8000 mg | ORAL_CAPSULE | Freq: Every day | ORAL | 11 refills | Status: DC

## 2018-12-28 MED ORDER — SILDENAFIL CITRATE 20 MG PO TABS: 100.0000 mg | ORAL_TABLET | ORAL | 11 refills | Status: DC | PRN

## 2018-12-28 NOTE — Telephone Encounter (Signed)
-----   Message from Billey Co, MD sent at 12/28/2018 12:07 PM EDT ----- Regarding: follow up RTC one year with PSA prior, PVR. Thanks  Nickolas Madrid, MD 12/28/2018

## 2018-12-28 NOTE — Telephone Encounter (Signed)
Appts made

## 2018-12-28 NOTE — Progress Notes (Signed)
Virtual Visit via Telephone Note  I connected with Albert [name] on 12/28/18 at  9:45 AM EDT by telephone and verified that I am speaking with the correct person using two identifiers.   I discussed the limitations, risks, security and privacy concerns of performing an evaluation and management service by telephone and the availability of in person appointments. We discussed the impact of the COVID-19 pandemic on the healthcare system, and the importance of social distancing and reducing patient and provider exposure. I also discussed with the patient that there may be a patient responsible charge related to this service. The patient expressed understanding and agreed to proceed.  Reason for visit: BPH, ED, history of microscopic hematuria, PSA screening  History of Present Illness: I saw Mr. Fails as a virtual visit today for follow-up of BPH, ED, and history of microscopic hematuria.  He has a history of multiple negative hematuria work-ups(last was 2012), and refused further work-up with CTU and cystoscopy at our last visit.  He has mild BPH with weak stream and occasional difficulty starting stream that is well controlled on Flomax.  He is interested in potentially doubling the dose of Flomax to see if he gets a significant improvement.  His erectile dysfunction is well controlled on 100 mg Revatio on demand.  PSA has been stable, most recent value was 1.38.  He denies any significant changes in his health over the last year.  He denies any flank pain, gross hematuria, UTIs, or history of retention.  Assessment and Plan: 53 year old male with mild BPH on Flomax, ED well-controlled on 100 mg Revatio as needed, and normal PSA on routine screening.  Okay to double Flomax to 0.8 mg nightly, we discussed the risk of dizziness or lightheadedness Revatio refilled Continue yearly PSA screening RTC 1 year for PVR, symptom check   I discussed the assessment and treatment plan with the patient. The  patient was provided an opportunity to ask questions and all were answered. The patient agreed with the plan and demonstrated an understanding of the instructions.   The patient was advised to call back or seek an in-person evaluation if the symptoms worsen or if the condition fails to improve as anticipated.  I provided 15 minutes of non-face-to-face time during this encounter.   Billey Co, MD

## 2019-01-13 LAB — LITHIUM LEVEL: Lithium Lvl: 0.8 mmol/L (ref 0.6–1.2)

## 2019-01-15 NOTE — Progress Notes (Signed)
Pt. Made aware and verbalized understanding.

## 2019-02-21 ENCOUNTER — Other Ambulatory Visit: Payer: Self-pay | Admitting: Psychiatry

## 2019-02-21 DIAGNOSIS — F3162 Bipolar disorder, current episode mixed, moderate: Secondary | ICD-10-CM

## 2019-02-21 NOTE — Telephone Encounter (Signed)
Clarify seroquel dose 300 mg or 400 mg?

## 2019-02-21 NOTE — Telephone Encounter (Signed)
Please call pt to ask how he wants the quetiapine written.  It has been written up to 300 mg 3 tablets each night.  Epic shows both 400 mg tablets and 300 mg tablets.  Which does he want?  What exactly does he take?

## 2019-02-22 ENCOUNTER — Other Ambulatory Visit: Payer: Self-pay | Admitting: Psychiatry

## 2019-02-22 NOTE — Telephone Encounter (Signed)
Currently he takes 2 of the 400 Mg. Then he has a 300 Mg PRN if the other dose does not work. He does need his PRN dose sent in.

## 2019-02-22 NOTE — Telephone Encounter (Signed)
Please refill.

## 2019-03-01 ENCOUNTER — Encounter: Payer: Self-pay | Admitting: Psychiatry

## 2019-03-01 ENCOUNTER — Other Ambulatory Visit: Payer: Self-pay

## 2019-03-01 ENCOUNTER — Ambulatory Visit (INDEPENDENT_AMBULATORY_CARE_PROVIDER_SITE_OTHER): Payer: Medicare Other | Admitting: Psychiatry

## 2019-03-01 DIAGNOSIS — F314 Bipolar disorder, current episode depressed, severe, without psychotic features: Secondary | ICD-10-CM

## 2019-03-01 DIAGNOSIS — F4001 Agoraphobia with panic disorder: Secondary | ICD-10-CM

## 2019-03-01 DIAGNOSIS — F411 Generalized anxiety disorder: Secondary | ICD-10-CM | POA: Diagnosis not present

## 2019-03-01 DIAGNOSIS — R6889 Other general symptoms and signs: Secondary | ICD-10-CM | POA: Diagnosis not present

## 2019-03-01 DIAGNOSIS — F5105 Insomnia due to other mental disorder: Secondary | ICD-10-CM

## 2019-03-01 MED ORDER — FLUOXETINE HCL 20 MG PO CAPS: 20.0000 mg | ORAL_CAPSULE | Freq: Every day | ORAL | 1 refills | Status: DC

## 2019-03-01 NOTE — Progress Notes (Signed)
Albert [name] [id number] August 24, 1965 53 y.o.   Virtual Visit via WebEX  I connected with pt by WebEx and verified that I am speaking with the correct person using two identifiers.   I discussed the limitations, risks, security and privacy concerns of performing an evaluation and management service by [facility] and the availability of in person appointments. I also discussed with the patient that there may be a patient responsible charge related to this service. The patient expressed understanding and agreed to proceed.  I discussed the assessment and treatment plan with the patient. The patient was provided an opportunity to ask questions and all were answered. The patient agreed with the plan and demonstrated an understanding of the instructions.   The patient was advised to call back or seek an in-person evaluation if the symptoms worsen or if the condition fails to improve as anticipated.  I provided 30 minutes of video time during this encounter. The call started at 100 and ended at 1:30. The patient was located at home and the provider was located office.   Subjective:   Patient ID:  Albert [name] is a 53 y.o. (DOB Jan 05, 1966) male.  Chief Complaint:  Chief Complaint  Patient presents with  . Follow-up    Medication Management  . Depression    Medication Management  . Anxiety    Medication Management    Depression        Associated symptoms include decreased concentration.  Associated symptoms include no suicidal ideas.  Past medical history includes anxiety.   Anxiety Symptoms include decreased concentration and nervous/anxious behavior. Patient reports no confusion, dizziness or suicidal ideas.      Albert [name] presents to the office today for follow-up of mixed bipolar, anxiety and still grieving stress of breakup.  He requires frequent follow-up because of long-term symptoms.  At visit October 25, 2018.  We made several medicine changes because of his concerns about sleep and other  issues.  We increased olanzapine back to 7.5 mg bc not sleeping as well at 5 mg.  He has to have the prescription for quetiapine written for up to 3 tablets at night in case insomnia is worse because insomnia makes his mood disorder so much worse.  We discussed that was above the usual max but the request was granted given his treatment resistant status. We also reduce lithium from 900 mg daily to 750 mg daily to try to reduce tremor and muscle twitches.  At the last visit December 25, 2018.  There was no significant clinical changes and no meds were changed.  Been very depressed and in a funk for several days with increased SI and anxiety.  Usually comes and goes in waves but more persistent.  Lowest he's been in a long time.  Consistent with meds.  Ativan not helping anxiety which is bad also.  Fidgety.  No trigger other than still grieving relationship and can't get it out of his head.  Poor energy, concentration and more forgetful. In bed more and less active.  Sleep ok lately which is unusual.  CO obsessive thoughts about broken relationship and has been doing this for months.  Can't let it go.  Loop.  Wonders if could try OCD med.  But wonders abt SE of extra meds too.  Wonders about more med trials for mood.  Likes the benefit that Zyprexa gives.  No sleepwalking nor falling nor odd behavior.  No history of sleep walking.  He understands this may recur  with an increase.  Also wants option to rarely take extra quetiapine 300 for sleep prn.  Still some memory issues.sleep 5-6 hours nightly.  Depression is moderate.  Always spends a lot of time just laying around.  Occ rumination and rapid thoughts over exGF who got in another relationship and moved to [facility].  Can have anxiety for no reason like coming here, and varies in intensity without pattern.  Less panic than in hte past.  Some chronic depression and hyperactivity and loudness and hyperverbal.  Usually back to sleep.  Total 6 hours but some  napping.  Still lacks interest and motivation.  Can follow a TV show if interested.  M reports he's less confusion and seems to be taking meds correctly.  Still has some STM problems.  F still works at [employer].  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  Psych med hx extensive including ECT and risperidone, lithium 1200 SE, Zyprexa 20-15 akathisia, Latuda 80 which caused akathisia, Abilify, Vraylar, Rexulti, aripiprazole 20 mg with akathisia,Seroquel 1000 mg,  InVega, Geodon, lamotrigine 300 mg, Depakote 2000 mg, Saphris with side effects, symbyax,Tegretol,Trileptal and several of these in combinations, Fanapt NR . N-acetylcysteine, Nuedexta,  Belsomra with no response, Viibryd 40 mg for 3 months with diarrhea, protriptyline with side effects, clonidine, Trintellix 20 mg, Parnate 50 mg with no response, gabapentin, imipramine, Lunesta no response, venlafaxine,  Emsam 12 mg for 2 months, trazodone 200 mg,  methylphenidate 60 mg, bupropion was side effects,  Strattera,  Lexapro 20 mg, , modafinil, sertraline, paroxetine, Deplin, pramipexole, Vyvanse, Concerta, amantadine ,Patient prone to akathisia. Xanax, clonazepam.  Review of Systems:  Review of Systems  Neurological: Positive for tremors. Negative for dizziness and weakness.       Fidgety  Psychiatric/Behavioral: Positive for decreased concentration and depression. Negative for agitation, behavioral problems, confusion, dysphoric mood, hallucinations, self-injury, sleep disturbance and suicidal ideas. The patient is nervous/anxious and is hyperactive.     Medications: I have reviewed the patient's current medications.  Current Outpatient Medications  Medication Sig Dispense Refill  . esomeprazole (NEXIUM) 20 MG capsule Take by mouth.    . fluticasone (FLONASE) 50 MCG/ACT nasal spray     . levothyroxine (SYNTHROID, LEVOTHROID) 75 MCG tablet Take by mouth.    . lithium carbonate 150 MG capsule TAKE 1 CAPSULE BY MOUTH AT BEDTIME 90 capsule 0  .  lithium carbonate 300 MG capsule Take 2 capsules (600 mg total) by mouth daily. 180 capsule 1  . LORazepam (ATIVAN) 2 MG tablet TAKE ONE TABLET EVERY MORNING 1/2 TABLETAT NOON TAKE ONE TABLET 6PM AND 1/2 TABLET AT BEDTIME 90 tablet 2  . meclizine (ANTIVERT) 25 MG tablet Take 1 tablet (25 mg total) by mouth 3 (three) times daily as needed for dizziness. 20 tablet 0  . OLANZapine (ZYPREXA) 7.5 MG tablet TAKE ONE TABLET AT BEDTIME. 30 tablet 2  . ondansetron (ZOFRAN ODT) 4 MG disintegrating tablet Take 1 tablet (4 mg total) by mouth every 8 (eight) hours as needed for nausea or vomiting. 20 tablet 0  . QUEtiapine (SEROQUEL) 300 MG tablet TAKE ONE TABLET AT BEDTIME AS NEEDED FORSEVERE INSOMNIA 30 tablet 2  . QUEtiapine (SEROQUEL) 400 MG tablet 2 each night 60 tablet 2  . sildenafil (REVATIO) 20 MG tablet Take 5 tablets (100 mg total) by mouth as needed. 30 tablet 11  . tamsulosin (FLOMAX) 0.4 MG CAPS capsule Take 2 capsules (0.8 mg total) by mouth daily after supper. 30 capsule 11   No current  facility-administered medications for this visit.    Medication Side Effects: Other: mild sleepiness.  Occ twitches.  Allergies:  Allergies  Allergen Reactions  . Meloxicam Other (See Comments)    Other reaction(s): Dizziness  . Prednisone   . Wellbutrin [Bupropion]     Past Medical History:  Diagnosis Date  . Anxiety   . Bipolar disorder (HCC)   . Depression   . GERD (gastroesophageal reflux disease)     Family History  Problem Relation Age of Onset  . Prostate cancer Neg Hx   . Bladder Cancer Neg Hx   . Kidney cancer Neg Hx     Social History   Socioeconomic History  . Marital status: Single    Spouse name: Not on file  . Number of children: Not on file  . Years of education: Not on file  . Highest education level: Not on file  Occupational History  . Not on file  Social Needs  . Financial resource strain: Not on file  . Food insecurity    Worry: Not on file    Inability: Not  on file  . Transportation needs    Medical: Not on file    Non-medical: Not on file  Tobacco Use  . Smoking status: Former Games developer  . Smokeless tobacco: Never Used  Substance and Sexual Activity  . Alcohol use: Not Currently  . Drug use: Never  . Sexual activity: Not on file  Lifestyle  . Physical activity    Days per week: Not on file    Minutes per session: Not on file  . Stress: Not on file  Relationships  . Social Musician on phone: Not on file    Gets together: Not on file    Attends religious service: Not on file    Active member of club or organization: Not on file    Attends meetings of clubs or organizations: Not on file    Relationship status: Not on file  . Intimate partner violence    Fear of current or ex partner: Not on file    Emotionally abused: Not on file    Physically abused: Not on file    Forced sexual activity: Not on file  Other Topics Concern  . Not on file  Social History Narrative  . Not on file    Past Medical History, Surgical history, Social history, and Family history were reviewed and updated as appropriate.   Please see review of systems for further details on the patient's review from today.   Objective:   Physical Exam:  There were no vitals taken for this visit.  Physical Exam Neurological:     Mental Status: He is alert and oriented to person, place, and time.     Cranial Nerves: No dysarthria.  Psychiatric:        Attention and Perception: He does not perceive auditory or visual hallucinations.        Mood and Affect: Mood is anxious and depressed. Affect is not angry or tearful.        Speech: Speech normal. Speech is not slurred.        Behavior: Behavior is not slowed. Behavior is cooperative.        Thought Content: Thought content is not paranoid or delusional. Thought content includes suicidal ideation. Thought content does not include homicidal ideation. Thought content does not include homicidal or suicidal  plan.        Cognition and Memory: He exhibits  impaired recent memory.        Judgment: Judgment normal.     Comments: Insight fair Memory is better than it was. He is chronically anxious and somewhat needy. No manic episodes noted. Depression and anxiety are worse.     Lab Review:     Component Value Date/Time   NA 137 08/21/2016 1303   NA 142 03/20/2014 0514   K 3.5 08/21/2016 1303   K 4.1 03/20/2014 0514   CL 105 08/21/2016 1303   CL 112 (H) 03/20/2014 0514   CO2 23 08/21/2016 1303   CO2 26 03/20/2014 0514   GLUCOSE 124 (H) 08/21/2016 1303   GLUCOSE 99 03/20/2014 0514   BUN 8 08/21/2016 1303   BUN 7 03/20/2014 0514   CREATININE 1.01 08/21/2016 1303   CREATININE 0.78 03/20/2014 0514   CALCIUM 9.8 08/21/2016 1303   CALCIUM 9.5 03/20/2014 0514   PROT 7.8 08/21/2016 1303   PROT 7.3 03/18/2014 1459   ALBUMIN 4.6 08/21/2016 1303   ALBUMIN 3.2 (L) 03/18/2014 1459   AST 24 08/21/2016 1303   AST 16 03/18/2014 1459   ALT 29 08/21/2016 1303   ALT 16 03/18/2014 1459   ALKPHOS 63 08/21/2016 1303   ALKPHOS 105 03/18/2014 1459   BILITOT 1.0 08/21/2016 1303   BILITOT 0.3 03/18/2014 1459   GFRNONAA >60 08/21/2016 1303   GFRNONAA >60 03/20/2014 0514   GFRAA >60 08/21/2016 1303   GFRAA >60 03/20/2014 0514       Component Value Date/Time   WBC 9.2 08/21/2016 1303   RBC 4.67 08/21/2016 1303   HGB 15.1 08/21/2016 1303   HGB 12.6 (L) 03/20/2014 0514   HCT 43.9 08/21/2016 1303   HCT 38.4 (L) 03/20/2014 0514   PLT 413 08/21/2016 1303   PLT 346 03/20/2014 0514   MCV 94.1 08/21/2016 1303   MCV 89 03/20/2014 0514   MCH 32.3 08/21/2016 1303   MCHC 34.4 08/21/2016 1303   RDW 13.2 08/21/2016 1303   RDW 12.5 03/20/2014 0514   LYMPHSABS 3.2 03/20/2014 0514   MONOABS 1.0 03/20/2014 0514   EOSABS 0.4 03/20/2014 0514   BASOSABS 0.1 03/20/2014 0514    Lithium Lvl  Date Value Ref Range Status  01/12/2019 0.8 0.6 - 1.2 mmol/L Final    Comment:                                      Detection Limit = 0.1                           <0.1 indicates None Detected     Last lithium level Sept 0.7.   Last lithium level July 27, 2018 was normal at 1.0.   Lithium level [facility] October 03, 2018 = 1.2. Said he got lithium level at [facility] as requested.  Labs not in Epic.  Recent lipids ok except higher TG than usual.  Normal A1C.  No results found for: PHENYTOIN, PHENOBARB, VALPROATE, CBMZ   .res Assessment: Plan:    Albert [name] was seen today for follow-up, depression and anxiety.  Diagnoses and all orders for this visit:  Bipolar 1 disorder, mixed, moderate (HCC)  Generalized anxiety disorder  Panic disorder with agoraphobia  Forgetfulness  Insomnia due to mental condition    Chronic TR bipolar mixed and chronic anxiety.  He usually has mixed bipolar symptoms which we have not been  able to completely eliminate.   However this time he is noticeably more depressed and having some suicidal thoughts.  He is having less hyperactive or manic symptoms.  See long list of meds tried.   He is somewhat chronically unstable and recently has been worse because of problems related to benzodiazepines specifically Xanax from which she was having side effects those have resolved..  Consider fluvoxamine but problems with DDI with olanzapine so will defer.  He is cognition does seem better with the switch from alprazolam to lorazepam, per the patient and his mother..   We discussed the short-term risks associated with benzodiazepines including sedation and increased fall risk among others.  Discussed long-term side effect risk including dependence, potential withdrawal symptoms, and the potential eventual dose-related risk of dementia. No change in lorazepam.  Unfortunately due to the severity of set his symptoms polypharmacy is a necessity.  Answered his questions about any new psychiatric meds.  We discussed Caplyta but it is not FDA approved for bipolar disorder at this time.  But we will  consider this.  His sleep is better at the moment but that may be because he is more depressed.  No indication for dosage change olanzapine to 7.5 mg HS Disc unusual combo antipsychotics but it helps more than other options.  Discussed potential metabolic side effects associated with atypical antipsychotics, as well as potential risk for movement side effects. Advised pt to contact office if movement side effects occur.   Take LED Seroquel to sleep.   No obvious alternatives for sleep  Continue lithium to 2 of the 300 mg capsules + 1 of the 150 mg capsules. Consider increase but check level first.  He is worse now after the decrease but it's unclear if the 2 changes are related.  Discussed safety plan at length with patient.  Advised patient to contact office with any worsening signs and symptoms.  Instructed patient to go to the [facility] emergency room for evaluation if experiencing any acute safety concerns, to include suicidal intent. "I don't want to do it" .  Call if there is any worsening.  Counseled patient regarding potential benefits, risks, and side effects of lithium to include potential risk of lithium affecting thyroid and renal function.  Discussed need for periodic lab monitoring to determine drug level and to assess for potential adverse effects.  Counseled patient regarding signs and symptoms of lithium toxicity and advised that they notify office immediately or seek urgent medical attention if experiencing these signs and symptoms.  Patient advised to contact office with any questions or concerns.  Check lithium level ASAP.    Start fluoxetine 20 mg daily to use the combo with olanzapine for TR bipolar depression.  Discussed side effect risk including suicidal worsening and if that happens contact us immediately.  This appt was 30 mins.  FU4- 6 weeks   Lynder Parents, MD, DFAPA  Future Appointments  Date Time Provider [place]  12/24/2019  9:00 AM [facility]  [facility] None  12/31/2019  9:45 AM Billey Co, MD [facility] None    No orders of the defined types were placed in this encounter.     -------------------------------

## 2019-03-14 LAB — LITHIUM LEVEL: Lithium Lvl: 0.8 mmol/L (ref 0.6–1.2)

## 2019-03-22 ENCOUNTER — Other Ambulatory Visit: Payer: Self-pay | Admitting: Psychiatry

## 2019-03-22 DIAGNOSIS — F3162 Bipolar disorder, current episode mixed, moderate: Secondary | ICD-10-CM

## 2019-03-27 ENCOUNTER — Other Ambulatory Visit: Payer: Self-pay | Admitting: Psychiatry

## 2019-03-27 DIAGNOSIS — F314 Bipolar disorder, current episode depressed, severe, without psychotic features: Secondary | ICD-10-CM

## 2019-03-27 NOTE — Telephone Encounter (Signed)
Apt 11/25

## 2019-03-28 ENCOUNTER — Ambulatory Visit (INDEPENDENT_AMBULATORY_CARE_PROVIDER_SITE_OTHER): Payer: Medicare Other | Admitting: Psychiatry

## 2019-03-28 ENCOUNTER — Encounter: Payer: Self-pay | Admitting: Psychiatry

## 2019-03-28 ENCOUNTER — Other Ambulatory Visit: Payer: Self-pay

## 2019-03-28 DIAGNOSIS — F4001 Agoraphobia with panic disorder: Secondary | ICD-10-CM | POA: Diagnosis not present

## 2019-03-28 DIAGNOSIS — F411 Generalized anxiety disorder: Secondary | ICD-10-CM | POA: Diagnosis not present

## 2019-03-28 DIAGNOSIS — R6889 Other general symptoms and signs: Secondary | ICD-10-CM

## 2019-03-28 DIAGNOSIS — F314 Bipolar disorder, current episode depressed, severe, without psychotic features: Secondary | ICD-10-CM

## 2019-03-28 DIAGNOSIS — Z79899 Other long term (current) drug therapy: Secondary | ICD-10-CM

## 2019-03-28 DIAGNOSIS — F5105 Insomnia due to other mental disorder: Secondary | ICD-10-CM

## 2019-03-28 MED ORDER — FLUOXETINE HCL 20 MG PO CAPS: 60.0000 mg | ORAL_CAPSULE | Freq: Every day | ORAL | 1 refills | Status: DC

## 2019-03-28 NOTE — Progress Notes (Signed)
Albert [name] [id number] January 26, 1966 53 y.o.   Virtual Visit via [facility]  I connected with pt by WebEx and verified that I am speaking with the correct person using two identifiers.   I discussed the limitations, risks, security and privacy concerns of performing an evaluation and management service by Albert [name] and the availability of in person appointments. I also discussed with the patient that there may be a patient responsible charge related to this service. The patient expressed understanding and agreed to proceed.  I discussed the assessment and treatment plan with the patient. The patient was provided an opportunity to ask questions and all were answered. The patient agreed with the plan and demonstrated an understanding of the instructions.   The patient was advised to call back or seek an in-person evaluation if the symptoms worsen or if the condition fails to improve as anticipated.  I provided 30 minutes of video time during this encounter. The call started at 130 and ended at 200. The patient was located at home and the provider was located office.   Subjective:   Patient ID:  Albert [name] is a 53 y.o. (DOB December 10, 1965) male.  Chief Complaint:  Chief Complaint  Patient presents with  . Follow-up    Medication Management  . Anxiety    Medication Management  . Depression    Medication Management    Depression        Associated symptoms include decreased concentration.  Associated symptoms include no suicidal ideas.  Past medical history includes anxiety.   Anxiety Symptoms include decreased concentration and nervous/anxious behavior. Patient reports no confusion, dizziness or suicidal ideas.      Albert [name] presents to the office today for follow-up of mixed bipolar, anxiety and still grieving stress of breakup.  He requires frequent follow-up because of long-term symptoms.  At visit October 25, 2018.  We made several medicine changes because of his concerns about sleep and other  issues.  We increased olanzapine back to 7.5 mg bc not sleeping as well at 5 mg.  He has to have the prescription for quetiapine written for up to 3 tablets at night in case insomnia is worse because insomnia makes his mood disorder so much worse.  We discussed that was above the usual max but the request was granted given his treatment resistant status. We also reduce lithium from 900 mg daily to 750 mg daily to try to reduce tremor and muscle twitches.  At last visit March 01, 2019 added fluoxetine 20 mg daily for depression.    Still very depressed and tend to stay in his room and his bed.  Can perform necessary chores.  Will get out of the house when he can.  No change in death thoughts and anxiety in intensity but is better with frequency.  Usually comes and goes in waves but more persistent.  Consistent with meds.  Ativan not helping anxiety which is bad also.  Fidgety.  No trigger other than still grieving relationship and can't get it out of his head.  Poor energy, concentration and more forgetful. In bed more and less active.  Sleep ok lately which is unusual.  Can concentrate on financial matters and stock market at times.  Tolerating meds.  Less frequent obsessive thoughts about broken relationship and has been doing this for months.  Can't let it go.  Loop.    Wonders about more med trials for mood.  Likes the benefit that Zyprexa gives.  No sleepwalking  nor falling nor odd behavior.  No history of sleep walking.  He understands this may recur with an increase.  Also wants option to rarely take extra quetiapine 300 for sleep prn.  Still some memory issues.sleep 5-6 hours nightly.  Trying to reduce quetiapine to 600 mg nightly and cannot always make that work for sleep.  Depression is moderate.  Always spends a lot of time just laying around.  Occ rumination and rapid thoughts over exGF who got in another relationship and moved to [facility].  Can have anxiety for no reason like coming here, and  varies in intensity without pattern.  Less panic than in the past.  Some chronic depression and hyperactivity and loudness and hyperverbal.  Usually back to sleep.  Total 6 hours but some napping.  Still lacks interest and motivation.  Can follow a TV show if interested.  M reports he's less confusion and seems to be taking meds correctly.  Still has some STM problems.  F still works at [employer].  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  Psych med hx extensive including ECT and risperidone, lithium 1200 SE, Zyprexa 20-15 akathisia, Latuda 80 which caused akathisia, Abilify, Vraylar, Rexulti, aripiprazole 20 mg with akathisia,Seroquel 1000 mg,  InVega, Geodon, lamotrigine 300 mg, Depakote 2000 mg, Saphris with side effects, symbyax,Tegretol,Trileptal and several of these in combinations, Fanapt NR . N-acetylcysteine, Nuedexta,  Belsomra with no response, Viibryd 40 mg for 3 months with diarrhea, protriptyline with side effects, clonidine, Trintellix 20 mg, Parnate 50 mg with no response, gabapentin, imipramine, Lunesta no response, venlafaxine,  Emsam 12 mg for 2 months, trazodone 200 mg,  methylphenidate 60 mg, bupropion was side effects,  Strattera,  Lexapro 20 mg, , modafinil, sertraline, paroxetine, Deplin, pramipexole, Vyvanse, Concerta, amantadine ,Patient prone to akathisia. Xanax, clonazepam.  Review of Systems:  Review of Systems  Neurological: Positive for tremors. Negative for dizziness and weakness.       Fidgety  Psychiatric/Behavioral: Positive for decreased concentration and depression. Negative for agitation, behavioral problems, confusion, dysphoric mood, hallucinations, self-injury, sleep disturbance and suicidal ideas. The patient is nervous/anxious and is hyperactive.     Medications: I have reviewed the patient's current medications.  Current Outpatient Medications  Medication Sig Dispense Refill  . esomeprazole (NEXIUM) 20 MG capsule Take by mouth.    [name] FLUoxetine (PROZAC) 20  MG capsule Take 3 capsules (60 mg total) by mouth daily. 90 capsule 1  . fluticasone (FLONASE) 50 MCG/ACT nasal spray     . levothyroxine (SYNTHROID, LEVOTHROID) 75 MCG tablet Take by mouth.    . lithium carbonate 150 MG capsule TAKE 1 CAPSULE BY MOUTH AT BEDTIME 90 capsule 0  . lithium carbonate 300 MG capsule Take 2 capsules (600 mg total) by mouth daily. 180 capsule 1  . LORazepam (ATIVAN) 2 MG tablet TAKE ONE TABLET EVERY MORNING 1/2 TABLETAT NOON TAKE ONE TABLET 6PM AND 1/2 TABLET AT BEDTIME (Patient taking differently: Take 2 mg by mouth 3 (three) times daily. ) 90 tablet 2  . meclizine (ANTIVERT) 25 MG tablet Take 1 tablet (25 mg total) by mouth 3 (three) times daily as needed for dizziness. 20 tablet 0  . OLANZapine (ZYPREXA) 7.5 MG tablet TAKE 1 TABLET AT BEDTIME 30 tablet 2  . ondansetron (ZOFRAN ODT) 4 MG disintegrating tablet Take 1 tablet (4 mg total) by mouth every 8 (eight) hours as needed for nausea or vomiting. 20 tablet 0  . QUEtiapine (SEROQUEL) 300 MG tablet TAKE ONE TABLET AT BEDTIME  AS NEEDED FORSEVERE INSOMNIA 30 tablet 2  . QUEtiapine (SEROQUEL) 400 MG tablet 2 each night 60 tablet 2  . sildenafil (REVATIO) 20 MG tablet Take 5 tablets (100 mg total) by mouth as needed. 30 tablet 11  . tamsulosin (FLOMAX) 0.4 MG CAPS capsule Take 2 capsules (0.8 mg total) by mouth daily after supper. (Patient taking differently: Take 0.4 mg by mouth daily after supper. ) 30 capsule 11   No current facility-administered medications for this visit.    Medication Side Effects: Other: mild sleepiness.  Occ twitches.  Allergies:  Allergies  Allergen Reactions  . Meloxicam Other (See Comments)    Other reaction(s): Dizziness  . Prednisone   . Wellbutrin [Bupropion]     Past Medical History:  Diagnosis Date  . Anxiety   . Bipolar disorder (HCC)   . Depression   . GERD (gastroesophageal reflux disease)     Family History  Problem Relation Age of Onset  . Prostate cancer Neg Hx   .  Bladder Cancer Neg Hx   . Kidney cancer Neg Hx     Social History   Socioeconomic History  . Marital status: Single    Spouse name: Not on file  . Number of children: Not on file  . Years of education: Not on file  . Highest education level: Not on file  Occupational History  . Not on file  Social Needs  . Financial resource strain: Not on file  . Food insecurity    Worry: Not on file    Inability: Not on file  . Transportation needs    Medical: Not on file    Non-medical: Not on file  Tobacco Use  . Smoking status: Former Games developer  . Smokeless tobacco: Never Used  Substance and Sexual Activity  . Alcohol use: Not Currently  . Drug use: Never  . Sexual activity: Not on file  Lifestyle  . Physical activity    Days per week: Not on file    Minutes per session: Not on file  . Stress: Not on file  Relationships  . Social Musician on phone: Not on file    Gets together: Not on file    Attends religious service: Not on file    Active member of club or organization: Not on file    Attends meetings of clubs or organizations: Not on file    Relationship status: Not on file  . Intimate partner violence    Fear of current or ex partner: Not on file    Emotionally abused: Not on file    Physically abused: Not on file    Forced sexual activity: Not on file  Other Topics Concern  . Not on file  Social History Narrative  . Not on file    Past Medical History, Surgical history, Social history, and Family history were reviewed and updated as appropriate.   Please see review of systems for further details on the patient's review from today.   Objective:   Physical Exam:  There were no vitals taken for this visit.  Physical Exam Neurological:     Mental Status: He is alert and oriented to person, place, and time.     Cranial Nerves: No dysarthria.  Psychiatric:        Attention and Perception: He does not perceive auditory or visual hallucinations.         Mood and Affect: Mood is anxious and depressed. Affect is not angry or tearful.  Speech: Speech normal. Speech is not slurred.        Behavior: Behavior is not slowed. Behavior is cooperative.        Thought Content: Thought content is not paranoid or delusional. Thought content includes suicidal ideation. Thought content does not include homicidal ideation. Thought content does not include homicidal or suicidal plan.        Cognition and Memory: He exhibits impaired recent memory.        Judgment: Judgment normal.     Comments: Insight fair Memory is better than it was. He is chronically anxious and somewhat needy. No manic episodes noted. Depression and anxiety are worse.     Lab Review:     Component Value Date/Time   NA 137 08/21/2016 1303   NA 142 03/20/2014 0514   K 3.5 08/21/2016 1303   K 4.1 03/20/2014 0514   CL 105 08/21/2016 1303   CL 112 (H) 03/20/2014 0514   CO2 23 08/21/2016 1303   CO2 26 03/20/2014 0514   GLUCOSE 124 (H) 08/21/2016 1303   GLUCOSE 99 03/20/2014 0514   BUN 8 08/21/2016 1303   BUN 7 03/20/2014 0514   CREATININE 1.01 08/21/2016 1303   CREATININE 0.78 03/20/2014 0514   CALCIUM 9.8 08/21/2016 1303   CALCIUM 9.5 03/20/2014 0514   PROT 7.8 08/21/2016 1303   PROT 7.3 03/18/2014 1459   ALBUMIN 4.6 08/21/2016 1303   ALBUMIN 3.2 (L) 03/18/2014 1459   AST 24 08/21/2016 1303   AST 16 03/18/2014 1459   ALT 29 08/21/2016 1303   ALT 16 03/18/2014 1459   ALKPHOS 63 08/21/2016 1303   ALKPHOS 105 03/18/2014 1459   BILITOT 1.0 08/21/2016 1303   BILITOT 0.3 03/18/2014 1459   GFRNONAA >60 08/21/2016 1303   GFRNONAA >60 03/20/2014 0514   GFRAA >60 08/21/2016 1303   GFRAA >60 03/20/2014 0514       Component Value Date/Time   WBC 9.2 08/21/2016 1303   RBC 4.67 08/21/2016 1303   HGB 15.1 08/21/2016 1303   HGB 12.6 (L) 03/20/2014 0514   HCT 43.9 08/21/2016 1303   HCT 38.4 (L) 03/20/2014 0514   PLT 413 08/21/2016 1303   PLT 346 03/20/2014 0514    MCV 94.1 08/21/2016 1303   MCV 89 03/20/2014 0514   MCH 32.3 08/21/2016 1303   MCHC 34.4 08/21/2016 1303   RDW 13.2 08/21/2016 1303   RDW 12.5 03/20/2014 0514   LYMPHSABS 3.2 03/20/2014 0514   MONOABS 1.0 03/20/2014 0514   EOSABS 0.4 03/20/2014 0514   BASOSABS 0.1 03/20/2014 0514    Lithium Lvl  Date Value Ref Range Status  03/13/2019 0.8 0.6 - 1.2 mmol/L Final    Comment:                                     Detection Limit = 0.1                           <0.1 indicates None Detected     Last lithium level Sept 0.8.   Last lithium level July 27, 2018 was normal at 1.0.   Lithium level [facility] October 03, 2018 = 1.2. Said he got lithium level at [facility] as requested.  Labs not in Epic.  Recent lipids ok except higher TG than usual.  Normal A1C.  No results found for: PHENYTOIN, PHENOBARB, VALPROATE, CBMZ   .  res Assessment: Plan:    Albert [name] was seen today for follow-up, anxiety and depression.  Diagnoses and all orders for this visit:  Generalized anxiety disorder  Severe bipolar I disorder, current or most recent episode depressed (HCC) -     FLUoxetine (PROZAC) 20 MG capsule; Take 3 capsules (60 mg total) by mouth daily.  Panic disorder with agoraphobia  Forgetfulness  Insomnia due to mental condition  Lithium use    Chronic TR bipolar mixed and chronic anxiety.  He usually has mixed bipolar symptoms which we have not been able to completely eliminate.   However this time he is noticeably more depressed and having some suicidal thoughts.  He is having less hyperactive or manic symptoms.  See long list of meds tried.   He is somewhat chronically unstable and recently has been worse because of problems related to benzodiazepines specifically Xanax from which she was having side effects those have resolved.[name]  He is cognition does seem better with the switch from alprazolam to lorazepam, per the patient and his mother..   We discussed the short-term risks associated with  benzodiazepines including sedation and increased fall risk among others.  Discussed long-term side effect risk including dependence, potential withdrawal symptoms, and the potential eventual dose-related risk of dementia. No change in lorazepam 2mg  TID.  Unfortunately due to the severity of set his symptoms polypharmacy is a necessity.  Answered his questions about any new psychiatric meds.  We discussed Caplyta but it is not FDA approved for bipolar disorder at this time.  But we will consider this.  His sleep is better at the moment but that may be because he is more depressed.  No indication for dosage change olanzapine to 7.5 mg HS Disc unusual combo antipsychotics but it helps more than other options.  Discussed potential metabolic side effects associated with atypical antipsychotics, as well as potential risk for movement side effects. Advised pt to contact office if movement side effects occur.   Take LED Seroquel to sleep.   No obvious alternatives for sleep  Continue lithium to 2 of the 300 mg capsules + 1 of the 150 mg capsules. Lithium level 0.8.    Discussed safety plan at length with patient.  Advised patient to contact office with any worsening signs and symptoms.  Instructed patient to go to the [facility] emergency room for evaluation if experiencing any acute safety concerns, to include suicidal intent. "I don't want to do it" .  Call if there is any worsening.   Counseled patient regarding potential benefits, risks, and side effects of lithium to include potential risk of lithium affecting thyroid and renal function.  Discussed need for periodic lab monitoring to determine drug level and to assess for potential adverse effects.  Counseled patient regarding signs and symptoms of lithium toxicity and advised that they notify office immediately or seek urgent medical attention if experiencing these signs and symptoms.  Patient advised to contact office with any questions or  concerns.  Increase fluoxetine 40 mg daily to use the combo with olanzapine for TR bipolar depression.  Discussed side effect risk including suicidal worsening and if that happens contact [facility] immediately.  This appt was 30 mins.  FU 6 weeks   [place], MD, DFAPA  Future Appointments  Date Time Provider Department Center  12/24/2019  9:00 AM [facility] None  12/31/2019  9:45 AM 01/02/2020, MD [facility] None    No orders of the defined types were placed in  this encounter.     -------------------------------

## 2019-04-02 DIAGNOSIS — N401 Enlarged prostate with lower urinary tract symptoms: Secondary | ICD-10-CM | POA: Insufficient documentation

## 2019-04-02 DIAGNOSIS — E781 Pure hyperglyceridemia: Secondary | ICD-10-CM | POA: Insufficient documentation

## 2019-04-02 DIAGNOSIS — K219 Gastro-esophageal reflux disease without esophagitis: Secondary | ICD-10-CM | POA: Insufficient documentation

## 2019-04-18 ENCOUNTER — Emergency Department
Admission: EM | Admit: 2019-04-18 | Discharge: 2019-04-18 | Disposition: A | Discharge status: Home / Self Care | Payer: Medicare Other | Attending: Emergency Medicine | Admitting: Emergency Medicine

## 2019-04-18 ENCOUNTER — Emergency Department: Payer: Medicare Other

## 2019-04-18 ENCOUNTER — Encounter: Payer: Self-pay | Admitting: Emergency Medicine

## 2019-04-18 ENCOUNTER — Other Ambulatory Visit: Payer: Self-pay

## 2019-04-18 DIAGNOSIS — N179 Acute kidney failure, unspecified: Secondary | ICD-10-CM | POA: Diagnosis not present

## 2019-04-18 DIAGNOSIS — Z79899 Other long term (current) drug therapy: Secondary | ICD-10-CM | POA: Diagnosis not present

## 2019-04-18 DIAGNOSIS — Z87891 Personal history of nicotine dependence: Secondary | ICD-10-CM | POA: Insufficient documentation

## 2019-04-18 DIAGNOSIS — K5900 Constipation, unspecified: Secondary | ICD-10-CM

## 2019-04-18 LAB — URINALYSIS, COMPLETE (UACMP) WITH MICROSCOPIC
Bacteria, UA: NONE SEEN
Bilirubin Urine: NEGATIVE
Glucose, UA: NEGATIVE mg/dL
Ketones, ur: NEGATIVE mg/dL
Leukocytes,Ua: NEGATIVE
Nitrite: NEGATIVE
Protein, ur: NEGATIVE mg/dL
Specific Gravity, Urine: 1.009 (ref 1.005–1.030)
pH: 7 (ref 5.0–8.0)

## 2019-04-18 LAB — COMPREHENSIVE METABOLIC PANEL
ALT: 18 U/L (ref 0–44)
AST: 24 U/L (ref 15–41)
Albumin: 4.6 g/dL (ref 3.5–5.0)
Alkaline Phosphatase: 40 U/L (ref 38–126)
Anion gap: 11 (ref 5–15)
BUN: 13 mg/dL (ref 6–20)
CO2: 20 mmol/L — ABNORMAL LOW (ref 22–32)
Calcium: 9.5 mg/dL (ref 8.9–10.3)
Chloride: 106 mmol/L (ref 98–111)
Creatinine, Ser: 1.63 mg/dL — ABNORMAL HIGH (ref 0.61–1.24)
GFR calc Af Amer: 54 mL/min — ABNORMAL LOW (ref >60)
GFR calc non Af Amer: 47 mL/min — ABNORMAL LOW (ref >60)
Glucose, Bld: 148 mg/dL — ABNORMAL HIGH (ref 70–99)
Potassium: 3.9 mmol/L (ref 3.5–5.1)
Sodium: 137 mmol/L (ref 135–145)
Total Bilirubin: 0.7 mg/dL (ref 0.3–1.2)
Total Protein: 7.8 g/dL (ref 6.5–8.1)

## 2019-04-18 LAB — CBC
HCT: 44.8 % (ref 39.0–52.0)
Hemoglobin: 14.6 g/dL (ref 13.0–17.0)
MCH: 31.1 pg (ref 26.0–34.0)
MCHC: 32.6 g/dL (ref 30.0–36.0)
MCV: 95.5 fL (ref 80.0–100.0)
Platelets: 406 10*3/uL — ABNORMAL HIGH (ref 150–400)
RBC: 4.69 MIL/uL (ref 4.22–5.81)
RDW: 12.8 % (ref 11.5–15.5)
WBC: 6.4 10*3/uL (ref 4.0–10.5)
nRBC: 0 % (ref 0.0–0.2)

## 2019-04-18 LAB — LIPASE, BLOOD: Lipase: 28 U/L (ref 11–51)

## 2019-04-18 MED ORDER — SODIUM CHLORIDE 0.9% FLUSH: 3.0000 mL | Freq: Once | INTRAVENOUS | Status: DC

## 2019-04-18 MED ORDER — SODIUM CHLORIDE 0.9 % IV BOLUS
1000.0000 mL | Freq: Once | INTRAVENOUS | Status: AC
  Administered 2019-04-18: 1000 mL via INTRAVENOUS

## 2019-04-18 MED ORDER — FLEET ENEMA 7-19 GM/118ML RE ENEM
1.0000 | ENEMA | Freq: Once | RECTAL | Status: AC
  Administered 2019-04-18: 1 via RECTAL

## 2019-04-18 NOTE — ED Triage Notes (Signed)
Say constipated for 7 days.   Says this happens and he usually gets better with mirilax.   He tried that and glycerine suppostories.  Says he also has hemorrhoids.

## 2019-04-18 NOTE — ED Notes (Signed)
Pt states he had medium BM after digital exam by EDP. Pt still would like Fleet enema. Enema done.

## 2019-04-18 NOTE — ED Provider Notes (Signed)
[facility] Emergency Department Provider Note  ____________________________________________   First MD Initiated Contact with Patient 04/18/19 1644     (approximate)  I have reviewed the triage vital signs and the nursing notes.   HISTORY  Chief Complaint Constipation    HPI Albert [name] is a 53 y.o. male with bipolar, depression who comes in with constipation.  Patient states has been constipated for 7 days.  Usually he gets better with MiraLAX please also tried some glycerin suppositories with no improvement.  Patient states he has had this previously and had to come to the ER and had an enema that improved it.  He states that he does not feel any more distended but has had baseline it.  He denies any abdominal pain.  He states that he has had prior hiatal hernia repair.  His constipation is severe, constant, 7 days, not better with the above, nothing makes it worse          Past Medical History:  Diagnosis Date  . Anxiety   . Bipolar disorder (HCC)   . Depression   . GERD (gastroesophageal reflux disease)     Patient Active Problem List   Diagnosis Date Noted  . 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 Need for prophylactic chemotherapy 02/10/2018  . GAD (generalized anxiety disorder) 02/10/2018    Past Surgical History:  Procedure Laterality Date  . BACK SURGERY    . EYE SURGERY    . HERNIA REPAIR      Prior to Admission medications   Medication Sig Start Date End Date Taking? Authorizing Provider  esomeprazole (NEXIUM) 20 MG capsule Take by mouth.    [provider]  FLUoxetine (PROZAC) 20 MG capsule Take 3 capsules (60 mg total) by mouth daily. 03/28/19   Cottle, Steva Ready., MD  fluticasone Aleda Grana) 50 MCG/ACT nasal spray  01/21/14   [provider]  levothyroxine (SYNTHROID, LEVOTHROID) 75 MCG tablet Take by mouth. 09/14/17   [provider]  lithium carbonate 150  MG capsule TAKE 1 CAPSULE BY MOUTH AT BEDTIME 02/22/19   Cottle, Steva Ready., MD  lithium carbonate 300 MG capsule Take 2 capsules (600 mg total) by mouth daily. 11/27/18   Cottle, Steva Ready., MD  LORazepam (ATIVAN) 2 MG tablet TAKE ONE TABLET EVERY MORNING 1/2 TABLETAT NOON TAKE ONE TABLET 6PM AND 1/2 TABLET AT BEDTIME Patient taking differently: Take 2 mg by mouth 3 (three) times daily.  02/22/19   Cottle, Steva Ready., MD  meclizine (ANTIVERT) 25 MG tablet Take 1 tablet (25 mg total) by mouth 3 (three) times daily as needed for dizziness. 08/21/16   Jene Every, MD  OLANZapine (ZYPREXA) 7.5 MG tablet TAKE 1 TABLET AT BEDTIME 03/23/19   Cottle, Steva Ready., MD  ondansetron (ZOFRAN ODT) 4 MG disintegrating tablet Take 1 tablet (4 mg total) by mouth every 8 (eight) hours as needed for nausea or vomiting. 08/21/16   Jene Every, MD  QUEtiapine (SEROQUEL) 300 MG tablet TAKE ONE TABLET AT BEDTIME AS NEEDED FORSEVERE INSOMNIA 02/22/19   Cottle, Steva Ready., MD  QUEtiapine (SEROQUEL) 400 MG tablet 2 each night 10/30/18   Cottle, Steva Ready., MD  sildenafil (REVATIO) 20 MG tablet Take 5 tablets (100 mg total) by mouth as needed. 12/28/18   Sondra Come, MD  tamsulosin (FLOMAX) 0.4 MG CAPS capsule Take 2 capsules (0.8 mg total) by mouth daily after supper. Patient  taking differently: Take 0.4 mg by mouth daily after supper.  12/28/18   Sondra ComeSninsky, Brian C, MD    Allergies Meloxicam, Prednisone, and Wellbutrin [bupropion]  Family History  Problem Relation Age of Onset  . Prostate cancer Neg Hx   . Bladder Cancer Neg Hx   . Kidney cancer Neg Hx     Social History Social History   Tobacco Use  . Smoking status: Former Games developermoker  . Smokeless tobacco: Never Used  Substance Use Topics  . Alcohol use: Not Currently  . Drug use: Never      Review of Systems Constitutional: No fever/chills Eyes: No visual changes. ENT: No sore throat. Cardiovascular: Denies chest pain. Respiratory: Denies  shortness of breath. Gastrointestinal: + constipation  Genitourinary: Negative for dysuria. Musculoskeletal: Negative for back pain. Skin: Negative for rash. Neurological: Negative for headaches, focal weakness or numbness. All other ROS negative ____________________________________________   PHYSICAL EXAM:  VITAL SIGNS: ED Triage Vitals  Enc Vitals Group     BP --      Pulse Rate 04/18/19 1608 (!) 109     Resp 04/18/19 1608 16     Temp 04/18/19 1608 99.3 F (37.4 C)     Temp Source 04/18/19 1608 Oral     SpO2 04/18/19 1608 94 %     Weight 04/18/19 1609 190 lb (86.2 kg)     Height 04/18/19 1609 5\' 10"  (1.778 m)     Head Circumference --      Peak Flow --      Pain Score 04/18/19 1609 8     Pain Loc --      Pain Edu? --      Excl. in GC? --     Constitutional: Alert and oriented. Well appearing and in no acute distress. Eyes: Conjunctivae are normal. EOMI. Head: Atraumatic. Nose: No congestion/rhinnorhea. Mouth/Throat: Mucous membranes are moist.   Neck: No stridor. Trachea Midline. FROM Cardiovascular: Normal rate, regular rhythm. Grossly normal heart sounds.  Good peripheral circulation. Respiratory: Normal respiratory effort.  No retractions. Lungs CTAB. Gastrointestinal: Soft and nontender. No distention. No abdominal bruits. Small umbilical hernia that can be reduced.  Musculoskeletal: No lower extremity tenderness nor edema.  No joint effusions. Neurologic:  Normal speech and language. No gross focal neurologic deficits are appreciated.  Skin:  Skin is warm, dry and intact. No rash noted. Psychiatric: Mood and affect are normal. Speech and behavior are normal. GU: rectal exam with hard stool palpated but unable to be removed.   ____________________________________________   LABS (all labs ordered are listed, but only abnormal results are displayed)  Labs Reviewed  COMPREHENSIVE METABOLIC PANEL - Abnormal; Notable for the following components:      Result  Value   CO2 20 (*)    Glucose, Bld 148 (*)    Creatinine, Ser 1.63 (*)    GFR calc non Af Amer 47 (*)    GFR calc Af Amer 54 (*)    All other components within normal limits  CBC - Abnormal; Notable for the following components:   Platelets 406 (*)    All other components within normal limits  URINALYSIS, COMPLETE (UACMP) WITH MICROSCOPIC - Abnormal; Notable for the following components:   Color, Urine YELLOW (*)    APPearance CLEAR (*)    Hgb urine dipstick SMALL (*)    All other components within normal limits  LIPASE, BLOOD   ____________________________________________   RADIOLOGY Vela ProseI, Ahlayah Tarkowski E Audrick Lamoureaux, personally viewed and evaluated these images (plain  radiographs) as part of my medical decision making, as well as reviewing the written report by the radiologist.  ED MD interpretation:  Stool burden   Official radiology report(s): DG Abdomen 1 View  Result Date: 04/18/2019 CLINICAL DATA:  Constipation EXAM: ABDOMEN - 1 VIEW COMPARISON:  07/19/2006 FINDINGS: Moderate stool burden throughout the colon. There is a non obstructive bowel gas pattern. No supine evidence of free air. No organomegaly or suspicious calcification. No acute bony abnormality. IMPRESSION: Moderate stool burden.  No acute findings. Electronically Signed   By: Rolm Baptise M.D.   On: 04/18/2019 17:33    ____________________________________________   PROCEDURES  Procedure(s) performed (including Critical Care):  Procedures   ____________________________________________   INITIAL IMPRESSION / ASSESSMENT AND PLAN / ED COURSE  [name] was evaluated in Emergency Department on 04/18/2019 for the symptoms described in the history of present illness. He was evaluated in the context of the global COVID-19 pandemic, which necessitated consideration that the patient might be at risk for infection with the SARS-CoV-2 virus that causes COVID-19. Institutional protocols and algorithms that pertain to the  evaluation of patients at risk for COVID-19 are in a state of rapid change based on information released by regulatory bodies including the CDC and federal and state organizations. These policies and algorithms were followed during the patient's care in the ED.    Patient is a 53 year old who presents with constipation.  Patient had this previously.  Patient had prior abdominal surgery but his abdomen is soft and nontender.  He denies any abdominal pain or vomiting.  I have low suspicion for SBO, perforation, ileus but will get x-ray to confirm.  Patient's rectal exam does have some stool in the rectal vault but unable to remove it.  Will give enema and further evaluate.  Labs are ordered in triage to evaluate for electrolyte abnormalities, AKI.  Labs are notable for an increased creatinine of 1.6.  Last check at [facility] 1.4 on 11/24 but will give some fluids given patient has been taking MiraLAX and could be dehydrated.  No white count elevation to suggest infection.  6:19 PM Pt had some stool out on own but still wants enema.   7:02 PM enema given.  Patient had good output.  Patient states that he is feeling much better and like his symptoms have now resolved.  Patient feels comfortable going home at this time and will return to the ER with any other concerns.  I discussed the provisional nature of ED diagnosis, the treatment so far, the ongoing plan of care, follow up appointments and return precautions with the patient and any family or support people present. They expressed understanding and agreed with the plan, discharged home.         ____________________________________________   FINAL CLINICAL IMPRESSION(S) / ED DIAGNOSES   Final diagnoses:  AKI (acute kidney injury) ([place])  Constipation, unspecified constipation type      MEDICATIONS GIVEN DURING THIS VISIT:  Medications  sodium chloride 0.9 % bolus 1,000 mL (1,000 mLs Intravenous New Bag/Given 04/18/19 1835)  sodium  phosphate (FLEET) 7-19 GM/118ML enema 1 enema (1 enema Rectal Given 04/18/19 1838)     ED Discharge Orders    None       Note:  This document was prepared using Dragon voice recognition software and may include unintentional dictation errors.   Vanessa [place], MD 04/18/19 1929

## 2019-04-18 NOTE — ED Notes (Signed)
Pt OOB without assistance, gait steady. Pt refused wheelchair. Pt discharged to lobby.

## 2019-04-18 NOTE — Discharge Instructions (Signed)
Patient start taking 1 capful of MiraLAX every day.  If you develop diarrhea you can back off to once every other day.  You should plenty of fluid due to your elevated kidney function.  You should have this rechecked within 1 week to make sure that is not increasing.  Return to the ER for any other concerns

## 2019-04-28 ENCOUNTER — Other Ambulatory Visit: Payer: Self-pay | Admitting: Psychiatry

## 2019-04-28 DIAGNOSIS — F314 Bipolar disorder, current episode depressed, severe, without psychotic features: Secondary | ICD-10-CM

## 2019-05-09 ENCOUNTER — Ambulatory Visit (INDEPENDENT_AMBULATORY_CARE_PROVIDER_SITE_OTHER): Payer: Medicare PPO | Admitting: Psychiatry

## 2019-05-09 ENCOUNTER — Other Ambulatory Visit: Payer: Self-pay

## 2019-05-09 ENCOUNTER — Encounter: Payer: Self-pay | Admitting: Psychiatry

## 2019-05-09 DIAGNOSIS — F314 Bipolar disorder, current episode depressed, severe, without psychotic features: Secondary | ICD-10-CM | POA: Diagnosis not present

## 2019-05-09 DIAGNOSIS — R6889 Other general symptoms and signs: Secondary | ICD-10-CM | POA: Diagnosis not present

## 2019-05-09 DIAGNOSIS — F4001 Agoraphobia with panic disorder: Secondary | ICD-10-CM

## 2019-05-09 DIAGNOSIS — F411 Generalized anxiety disorder: Secondary | ICD-10-CM | POA: Diagnosis not present

## 2019-05-09 DIAGNOSIS — F5105 Insomnia due to other mental disorder: Secondary | ICD-10-CM

## 2019-05-09 MED ORDER — FLUOXETINE HCL 20 MG PO CAPS: 60.0000 mg | ORAL_CAPSULE | Freq: Every day | ORAL | 1 refills | Status: DC

## 2019-05-09 MED ORDER — OLANZAPINE 10 MG PO TABS: 10.0000 mg | ORAL_TABLET | Freq: Every day | ORAL | 0 refills | Status: DC

## 2019-05-09 MED ORDER — LORAZEPAM 2 MG PO TABS: ORAL_TABLET | ORAL | 2 refills | Status: DC

## 2019-05-09 NOTE — Progress Notes (Signed)
Albert [name] [id number] 1965-05-12 54 y.o.      Subjective:   Patient ID:  Albert [name] is a 54 y.o. (DOB 1965-05-12) male.  Chief Complaint:  Chief Complaint  Patient presents with  . Follow-up    Medication Managament  . Depression    Medication Managament  . Anxiety    Medication Managament    Anxiety Symptoms include decreased concentration and nervous/anxious behavior. Patient reports no confusion, dizziness or suicidal ideas.    Depression        Associated symptoms include decreased concentration.  Associated symptoms include no headaches and no suicidal ideas.  Past medical history includes anxiety.     Albert [name] presents to the office today for follow-up of mixed bipolar, anxiety and still grieving stress of breakup.  He requires frequent follow-up because of long-term symptoms.  At visit October 25, 2018.  We made several medicine changes because of his concerns about sleep and other issues.  We increased olanzapine back to 7.5 mg bc not sleeping as well at 5 mg.  He has to have the prescription for quetiapine written for up to 3 tablets at night in case insomnia is worse because insomnia makes his mood disorder so much worse.  We discussed that was above the usual max but the request was granted given his treatment resistant status. We also reduce lithium from 900 mg daily to 750 mg daily to try to reduce tremor and muscle twitches.  At last visit March 28, 2019.  Fluoxetine was increased to 40 mg daily.    Last Monday increased to 60 mg daily.  No SE.  Good news.  Parents  On way to see Albert [name] get [employer] today.  No Covid problems.    He wonders about retrying increase olanzapine back to 10 mg daily to see if he can tolerate it.   Still very depressed and tend to stay in his room and his bed.  Can perform necessary chores.  Will get out of the house when he can.  No change in death thoughts and anxiety in intensity but is better with  frequency.  Usually comes and goes in waves but more persistent.  Consistent with meds.  Ativan not helping anxiety which is bad also.  Fidgety.  No trigger other than still grieving relationship and can't get it out of his head.  Poor energy, concentration and more forgetful. In bed more and less active.  Sleep ok lately which is unusual.  Can concentrate on financial matters and stock market at times.  Tolerating meds.  Less frequent obsessive thoughts about broken relationship and has been doing this for months.  Can't let it go.  Loop.    Wonders about more med trials for mood.  Likes the benefit that Zyprexa gives.  No sleepwalking nor falling nor odd behavior.  No history of sleep walking.  He understands this may recur with an increase.  Also wants option to rarely take extra quetiapine 300 for sleep prn.  Still some memory issues.sleep 5-6 hours nightly.  Trying to stay out of bed when watching TV.  Some awakening but managing.  Not as much napping daytime since increase fluoxetine to 60.  Trying to reduce quetiapine to 600 mg nightly and cannot always make that work for sleep.  Depression is moderate.  Always spends a lot of time just laying around.  Occ rumination and rapid thoughts over exGF who got in another relationship and moved  to [facility].  Can have anxiety for no reason like coming here, and varies in intensity without pattern.  Overall anxiety seems a little worse.    Less panic than in the past.  Some chronic depression and hyperactivity and loudness and hyperverbal.  Usually back to sleep.  Total 6 hours but some napping.  Still lacks interest and motivation.  Can follow a TV show if interested.  M reports he's less confusion and seems to be taking meds correctly.  Still has some STM problems.  F still works at [employer].  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  Psych med hx extensive including ECT and risperidone, lithium 1200 SE, Zyprexa 20-15 akathisia, Latuda 80 which caused  akathisia, Abilify, Vraylar, Rexulti, aripiprazole 20 mg with akathisia,Seroquel 1000 mg,  InVega, Geodon, lamotrigine 300 mg, Depakote 2000 mg, Saphris with side effects, symbyax,Tegretol,Trileptal and several of these in combinations, Fanapt NR . N-acetylcysteine, Nuedexta,  Belsomra with no response, Viibryd 40 mg for 3 months with diarrhea, protriptyline with side effects, clonidine, Trintellix 20 mg, Parnate 50 mg with no response, gabapentin, imipramine, Lunesta no response, venlafaxine,  Emsam 12 mg for 2 months, trazodone 200 mg,  methylphenidate 60 mg, bupropion was side effects,  Strattera,  Lexapro 20 mg, , modafinil, sertraline, paroxetine, Deplin, pramipexole, Vyvanse, Concerta, amantadine ,Patient prone to akathisia. Xanax, clonazepam.  Review of Systems:  Review of Systems  Neurological: Positive for tremors. Negative for dizziness, weakness and headaches.       Fidgety  Psychiatric/Behavioral: Positive for decreased concentration and depression. Negative for agitation, behavioral problems, confusion, dysphoric mood, hallucinations, self-injury, sleep disturbance and suicidal ideas. The patient is nervous/anxious and is hyperactive.     Medications: I have reviewed the patient's current medications.  Current Outpatient Medications  Medication Sig Dispense Refill  . esomeprazole (NEXIUM) 20 MG capsule Take by mouth.    . fenofibrate (TRICOR) 145 MG tablet Take by mouth.    [name] FLUoxetine (PROZAC) 20 MG capsule Take 3 capsules (60 mg total) by mouth daily. 90 capsule 1  . fluticasone (FLONASE) 50 MCG/ACT nasal spray     . levothyroxine (SYNTHROID, LEVOTHROID) 75 MCG tablet Take by mouth.    . lithium carbonate 150 MG capsule TAKE 1 CAPSULE BY MOUTH AT BEDTIME 90 capsule 0  . lithium carbonate 300 MG capsule Take 2 capsules (600 mg total) by mouth daily. 180 capsule 1  . LORazepam (ATIVAN) 2 MG tablet TAKE ONE TABLET EVERY MORNING 1/2 TABLETAT NOON TAKE ONE TABLET 6PM AND 1/2 TABLET  AT BEDTIME 90 tablet 2  . meclizine (ANTIVERT) 25 MG tablet Take 1 tablet (25 mg total) by mouth 3 (three) times daily as needed for dizziness. 20 tablet 0  . OLANZapine (ZYPREXA) 7.5 MG tablet TAKE 1 TABLET AT BEDTIME 30 tablet 2  . ondansetron (ZOFRAN ODT) 4 MG disintegrating tablet Take 1 tablet (4 mg total) by mouth every 8 (eight) hours as needed for nausea or vomiting. 20 tablet 0  . QUEtiapine (SEROQUEL) 300 MG tablet TAKE ONE TABLET AT BEDTIME AS NEEDED FORSEVERE INSOMNIA 30 tablet 2  . QUEtiapine (SEROQUEL) 400 MG tablet 2 each night 60 tablet 2  . sildenafil (REVATIO) 20 MG tablet Take 5 tablets (100 mg total) by mouth as needed. 30 tablet 11  . tamsulosin (FLOMAX) 0.4 MG CAPS capsule Take 2 capsules (0.8 mg total) by mouth daily after supper. (Patient taking differently: Take 0.4 mg by mouth daily after supper. ) 30 capsule 11  . OLANZapine (ZYPREXA)  10 MG tablet Take 1 tablet (10 mg total) by mouth at bedtime. 30 tablet 0   No current facility-administered medications for this visit.   Medication Side Effects: Other: mild sleepiness.  Occ twitches.  Allergies:  Allergies  Allergen Reactions  . Meloxicam Other (See Comments)    Other reaction(s): Dizziness  . Prednisone   . Wellbutrin [Bupropion]     Past Medical History:  Diagnosis Date  . Anxiety   . Bipolar disorder (HCC)   . Depression   . GERD (gastroesophageal reflux disease)     Family History  Problem Relation Age of Onset  . Prostate cancer Neg Hx   . Bladder Cancer Neg Hx   . Kidney cancer Neg Hx     Social History   Socioeconomic History  . Marital status: Single    Spouse name: Not on file  . Number of children: Not on file  . Years of education: Not on file  . Highest education level: Not on file  Occupational History  . Not on file  Tobacco Use  . Smoking status: Former Games developer  . Smokeless tobacco: Never Used  Substance and Sexual Activity  . Alcohol use: Not Currently  . Drug use: Never   . Sexual activity: Not on file  Other Topics Concern  . Not on file  Social History Narrative  . Not on file   Social Determinants of Health   Financial Resource Strain:   . Difficulty of Paying Living Expenses: Not on file  Food Insecurity:   . Worried About Programme researcher, broadcasting/film/video in the Last Year: Not on file  . Ran Out of Food in the Last Year: Not on file  Transportation Needs:   . Lack of Transportation (Medical): Not on file  . Lack of Transportation (Non-Medical): Not on file  Physical Activity:   . Days of Exercise per Week: Not on file  . Minutes of Exercise per Session: Not on file  Stress:   . Feeling of Stress : Not on file  Social Connections:   . Frequency of Communication with Friends and Family: Not on file  . Frequency of Social Gatherings with Friends and Family: Not on file  . Attends Religious Services: Not on file  . Active Member of Clubs or Organizations: Not on file  . Attends Banker Meetings: Not on file  . Marital Status: Not on file  Intimate Partner Violence:   . Fear of Current or Ex-Partner: Not on file  . Emotionally Abused: Not on file  . Physically Abused: Not on file  . Sexually Abused: Not on file    Past Medical History, Surgical history, Social history, and Family history were reviewed and updated as appropriate.   Please see review of systems for further details on the patient's review from today.   Objective:   Physical Exam:  There were no vitals taken for this visit.  Physical Exam Constitutional:      General: He is not in acute distress.    Appearance: He is well-developed.  Musculoskeletal:        General: No deformity.  Neurological:     Mental Status: He is alert and oriented to person, place, and time.     Cranial Nerves: No dysarthria.     Coordination: Coordination normal.  Psychiatric:        Attention and Perception: Attention and perception normal. He does not perceive auditory or visual  hallucinations.  Mood and Affect: Mood is anxious and depressed. Affect is not labile, blunt, angry, tearful or inappropriate.        Speech: Speech normal. Speech is not slurred.        Behavior: Behavior normal. Behavior is not slowed. Behavior is cooperative.        Thought Content: Thought content is not paranoid or delusional. Thought content includes suicidal ideation. Thought content does not include homicidal ideation. Thought content does not include homicidal or suicidal plan.        Cognition and Memory: Cognition normal. He exhibits impaired recent memory.        Judgment: Judgment normal.     Comments: Insight fair Memory is better than it was. He is chronically anxious and somewhat needy. No manic episodes noted. Depression and anxiety are about the same as last visit     Lab Review:     Component Value Date/Time   NA 137 04/18/2019 1612   NA 142 03/20/2014 0514   K 3.9 04/18/2019 1612   K 4.1 03/20/2014 0514   CL 106 04/18/2019 1612   CL 112 (H) 03/20/2014 0514   CO2 20 (L) 04/18/2019 1612   CO2 26 03/20/2014 0514   GLUCOSE 148 (H) 04/18/2019 1612   GLUCOSE 99 03/20/2014 0514   BUN 13 04/18/2019 1612   BUN 7 03/20/2014 0514   CREATININE 1.63 (H) 04/18/2019 1612   CREATININE 0.78 03/20/2014 0514   CALCIUM 9.5 04/18/2019 1612   CALCIUM 9.5 03/20/2014 0514   PROT 7.8 04/18/2019 1612   PROT 7.3 03/18/2014 1459   ALBUMIN 4.6 04/18/2019 1612   ALBUMIN 3.2 (L) 03/18/2014 1459   AST 24 04/18/2019 1612   AST 16 03/18/2014 1459   ALT 18 04/18/2019 1612   ALT 16 03/18/2014 1459   ALKPHOS 40 04/18/2019 1612   ALKPHOS 105 03/18/2014 1459   BILITOT 0.7 04/18/2019 1612   BILITOT 0.3 03/18/2014 1459   GFRNONAA 47 (L) 04/18/2019 1612   GFRNONAA >60 03/20/2014 0514   GFRAA 54 (L) 04/18/2019 1612   GFRAA >60 03/20/2014 0514       Component Value Date/Time   WBC 6.4 04/18/2019 1612   RBC 4.69 04/18/2019 1612   HGB 14.6 04/18/2019 1612   HGB 12.6 (L)  03/20/2014 0514   HCT 44.8 04/18/2019 1612   HCT 38.4 (L) 03/20/2014 0514   PLT 406 (H) 04/18/2019 1612   PLT 346 03/20/2014 0514   MCV 95.5 04/18/2019 1612   MCV 89 03/20/2014 0514   MCH 31.1 04/18/2019 1612   MCHC 32.6 04/18/2019 1612   RDW 12.8 04/18/2019 1612   RDW 12.5 03/20/2014 0514   LYMPHSABS 3.2 03/20/2014 0514   MONOABS 1.0 03/20/2014 0514   EOSABS 0.4 03/20/2014 0514   BASOSABS 0.1 03/20/2014 0514    Lithium Lvl  Date Value Ref Range Status  03/13/2019 0.8 0.6 - 1.2 mmol/L Final    Comment:                                     Detection Limit = 0.1                           <0.1 indicates None Detected     Last lithium level Sept 0.8.   Last lithium level July 27, 2018 was normal at 1.0.   Lithium level [facility] October 03, 2018 = 1.2. Said he got lithium level at [facility] as requested.  Labs not in Epic.  Recent lipids ok except higher TG than usual.  Normal A1C.  No results found for: PHENYTOIN, PHENOBARB, VALPROATE, CBMZ   .res Assessment: Plan:    Albert [name] was seen today for follow-up, depression and anxiety.  Diagnoses and all orders for this visit:  Severe bipolar I disorder, current or most recent episode depressed (HCC) -     OLANZapine (ZYPREXA) 10 MG tablet; Take 1 tablet (10 mg total) by mouth at bedtime. -     FLUoxetine (PROZAC) 20 MG capsule; Take 3 capsules (60 mg total) by mouth daily.  Generalized anxiety disorder -     LORazepam (ATIVAN) 2 MG tablet; TAKE ONE TABLET EVERY MORNING 1/2 TABLETAT NOON TAKE ONE TABLET 6PM AND 1/2 TABLET AT BEDTIME  Panic disorder with agoraphobia -     LORazepam (ATIVAN) 2 MG tablet; TAKE ONE TABLET EVERY MORNING 1/2 TABLETAT NOON TAKE ONE TABLET 6PM AND 1/2 TABLET AT BEDTIME  Forgetfulness  Insomnia due to mental condition    Chronic TR bipolar mixed and chronic anxiety.  He usually has mixed bipolar symptoms which we have not been able to completely eliminate.   However this time he is noticeably more depressed  and having some suicidal thoughts.  He is having less hyperactive or manic symptoms.  See long list of meds tried.   He is somewhat chronically unstable and recently has been worse because of problems related to benzodiazepines specifically Xanax from which she was having side effects those have resolved.[name]  He is cognition does seem better with the switch from alprazolam to lorazepam, per the patient and his mother..   We discussed the short-term risks associated with benzodiazepines including sedation and increased fall risk among others.  Discussed long-term side effect risk including dependence, potential withdrawal symptoms, and the potential eventual dose-related risk of dementia. No change in lorazepam 2mg  TID.  Unfortunately due to the severity of set his symptoms polypharmacy is a necessity.  Answered his questions about any new psychiatric meds.  We discussed Caplyta but it is not FDA approved for bipolar disorder at this time.  But we will consider this.  His sleep is better at the moment but that may be because he is more depressed.  Per his request OK to retry change olanzapine to 10 mg HS Disc unusual combo antipsychotics but it helps more than other options.  Discussed potential metabolic side effects associated with atypical antipsychotics, as well as potential risk for movement side effects. Advised pt to contact office if movement side effects occur.  He had some falling around the time when he previously took olanzapine but he took it for several months at this dosage before he had any side effect issues and he was on Xanax at the time that the following occurred. Take LED Seroquel to sleep.   No obvious alternatives for sleep  Continue lithium to 2 of the 300 mg capsules + 1 of the 150 mg capsules. Lithium level 0.8.    Discussed safety plan at length with patient.  Advised patient to contact office with any worsening signs and symptoms.  Instructed patient to go to the [facility] emergency room for evaluation if experiencing any acute safety concerns, to include suicidal intent. "I don't want to do it" .  Call if there is any worsening.   Counseled patient regarding potential benefits, risks, and side effects of lithium to include  potential risk of lithium affecting thyroid and renal function.  Discussed need for periodic lab monitoring to determine drug level and to assess for potential adverse effects.  Counseled patient regarding signs and symptoms of lithium toxicity and advised that they notify office immediately or seek urgent medical attention if experiencing these signs and symptoms.  Patient advised to contact office with any questions or concerns.  Continue recently Increase fluoxetine 60 mg daily to use the combo with olanzapine for TR bipolar depression.  Discussed side effect risk including suicidal worsening and if that happens contact us immediately.  This needs another 7 weeks to evaluate unless he has adverse side effects.  This appt was 30 mins.  FU 7 weeks   Lynder Parents, MD, DFAPA  Future Appointments  Date Time Provider [place]  06/28/2019  1:00 PM Cottle, Billey Co., MD [facility] None  12/24/2019  9:00 AM [facility] None  12/31/2019  9:45 AM Billey Co, MD [facility] None    No orders of the defined types were placed in this encounter.     -------------------------------

## 2019-05-18 ENCOUNTER — Other Ambulatory Visit: Payer: Self-pay | Admitting: Psychiatry

## 2019-05-18 DIAGNOSIS — F314 Bipolar disorder, current episode depressed, severe, without psychotic features: Secondary | ICD-10-CM

## 2019-05-29 ENCOUNTER — Other Ambulatory Visit: Payer: Self-pay | Admitting: Nephrology

## 2019-05-29 DIAGNOSIS — N1831 Chronic kidney disease, stage 3a: Secondary | ICD-10-CM | POA: Insufficient documentation

## 2019-05-29 DIAGNOSIS — D631 Anemia in chronic kidney disease: Secondary | ICD-10-CM | POA: Insufficient documentation

## 2019-05-29 DIAGNOSIS — N183 Chronic kidney disease, stage 3 unspecified: Secondary | ICD-10-CM | POA: Insufficient documentation

## 2019-05-29 DIAGNOSIS — N189 Chronic kidney disease, unspecified: Secondary | ICD-10-CM | POA: Insufficient documentation

## 2019-05-29 DIAGNOSIS — I129 Hypertensive chronic kidney disease with stage 1 through stage 4 chronic kidney disease, or unspecified chronic kidney disease: Secondary | ICD-10-CM

## 2019-06-18 ENCOUNTER — Other Ambulatory Visit: Payer: Self-pay | Admitting: Psychiatry

## 2019-06-18 DIAGNOSIS — F3162 Bipolar disorder, current episode mixed, moderate: Secondary | ICD-10-CM

## 2019-06-18 DIAGNOSIS — F314 Bipolar disorder, current episode depressed, severe, without psychotic features: Secondary | ICD-10-CM

## 2019-06-25 ENCOUNTER — Ambulatory Visit
Admission: RE | Admit: 2019-06-25 | Discharge: 2019-06-25 | Disposition: A | Discharge status: Home / Self Care | Payer: Medicare PPO | Source: Ambulatory Visit | Attending: Nephrology | Admitting: Nephrology

## 2019-06-25 ENCOUNTER — Other Ambulatory Visit: Payer: Self-pay

## 2019-06-25 DIAGNOSIS — N183 Chronic kidney disease, stage 3 unspecified: Secondary | ICD-10-CM | POA: Diagnosis not present

## 2019-06-25 DIAGNOSIS — I129 Hypertensive chronic kidney disease with stage 1 through stage 4 chronic kidney disease, or unspecified chronic kidney disease: Secondary | ICD-10-CM | POA: Insufficient documentation

## 2019-06-25 DIAGNOSIS — D631 Anemia in chronic kidney disease: Secondary | ICD-10-CM | POA: Diagnosis present

## 2019-06-28 ENCOUNTER — Ambulatory Visit (INDEPENDENT_AMBULATORY_CARE_PROVIDER_SITE_OTHER): Payer: Medicare PPO | Admitting: Psychiatry

## 2019-06-28 ENCOUNTER — Encounter: Payer: Self-pay | Admitting: Psychiatry

## 2019-06-28 ENCOUNTER — Other Ambulatory Visit: Payer: Self-pay

## 2019-06-28 DIAGNOSIS — F5105 Insomnia due to other mental disorder: Secondary | ICD-10-CM

## 2019-06-28 DIAGNOSIS — Z79899 Other long term (current) drug therapy: Secondary | ICD-10-CM

## 2019-06-28 DIAGNOSIS — F314 Bipolar disorder, current episode depressed, severe, without psychotic features: Secondary | ICD-10-CM

## 2019-06-28 DIAGNOSIS — R6889 Other general symptoms and signs: Secondary | ICD-10-CM

## 2019-06-28 DIAGNOSIS — F411 Generalized anxiety disorder: Secondary | ICD-10-CM

## 2019-06-28 DIAGNOSIS — F4001 Agoraphobia with panic disorder: Secondary | ICD-10-CM

## 2019-06-28 NOTE — Patient Instructions (Signed)
Reduce lithium to just 2 of 300 mg size and stop the lithium 150 mg size.

## 2019-06-28 NOTE — Progress Notes (Signed)
Albert [name] [id number] 1965/11/17 54 y.o.      Subjective:   Patient ID:  Albert [name] is a 54 y.o. (DOB [birth year]) male.  Chief Complaint:  Chief Complaint  Patient presents with  . Manic Behavior  . Depression  . Follow-up    med changes  . Medication Problem    disc lithium and kidneys    Depression        Associated symptoms include decreased concentration.  Associated symptoms include no headaches and no suicidal ideas.  Past medical history includes anxiety.   Anxiety Symptoms include decreased concentration and nervous/anxious behavior. Patient reports no confusion, dizziness or suicidal ideas.      Albert [name] presents to the office today for follow-up of mixed bipolar, anxiety and still grieving stress of breakup.  He requires frequent follow-up because of long-term symptoms.  At visit October 25, 2018.  We made several medicine changes because of his concerns about sleep and other issues.  We increased olanzapine back to 7.5 mg bc not sleeping as well at 5 mg.  He has to have the prescription for quetiapine written for up to 3 tablets at night in case insomnia is worse because insomnia makes his mood disorder so much worse.  We discussed that was above the usual max but the request was granted given his treatment resistant status. We also reduce lithium from 900 mg daily to 750 mg daily to try to reduce tremor and muscle twitches.  At  visit March 28, 2019.  Fluoxetine was increased to 40 mg daily.   Early January 2021 increased to 60 mg daily.  No SE.  Last seen May 09, 2019.  No further meds were changed  Disc kidney issues.   No Covid problems.    He wonders about retrying increase olanzapine back to 10 mg daily to see if he can tolerate it.   Still very depressed and tend to stay in his room and his bed.  Still has rapid cycling mood swings.  About the same overall.  Would like to get out more but can't DT Covid.  Can perform necessary chores.  Will get out of  the house when he can.  No change in death thoughts and anxiety in intensity but is better with frequency.  Usually comes and goes in waves but more persistent.  Consistent with meds.  Ativan not helping anxiety very dramatically but he's not sure.  Fidgety.  No trigger other than still grieving relationship and can't get it out of his head.  Poor energy, concentration and more forgetful. In bed more and less active.  Sleep ok lately which is unusual.  Can concentrate on financial matters and stock market at times.  Tolerating meds.  Less frequent obsessive thoughts about broken relationship and has been doing this for months.  Can't let it go.  Loop.    Likes the benefit that Zyprexa gives.  No sleepwalking nor falling nor odd behavior.  No history of sleep walking.  He understands this may recur with an increase.  Also wants option to rarely take extra quetiapine 300 for sleep prn.  Still some memory issues.sleep 5-6 hours nightly.  Trying to stay out of bed when watching TV.  Some awakening but managing.  Not as much napping daytime since increase fluoxetine to 60.  Trying to reduce quetiapine to 600 mg nightly and cannot always make that work for sleep.  Depression is moderate.  Always spends a lot of time  just laying around.  Occ rumination and rapid thoughts over exGF who got in another relationship and moved to [facility].  Can have anxiety for no reason like coming here, and varies in intensity without pattern.  Overall anxiety seems a little worse.    Less panic than in the past.  Some chronic depression and hyperactivity and loudness and hyperverbal.  Usually back to sleep.  Total 6 hours but some napping.  Still lacks interest and motivation.  Can follow a TV show if interested.  M reports he's less confusion and seems to be taking meds correctly.  Still has some STM problems.  F still works at [employer].  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  Psych med hx extensive including ECT and risperidone,  lithium 1200 SE, Zyprexa 20-15 akathisia, Latuda 80 which caused akathisia, Abilify, Vraylar, Rexulti, aripiprazole 20 mg with akathisia,Seroquel 1000 mg,  InVega, Geodon, lamotrigine 300 mg, Depakote 2000 mg, Saphris with side effects, symbyax,Tegretol,Trileptal and several of these in combinations, Fanapt NR . N-acetylcysteine, Nuedexta,  Belsomra with no response, Viibryd 40 mg for 3 months with diarrhea, protriptyline with side effects, clonidine, Trintellix 20 mg, Parnate 50 mg with no response, gabapentin, imipramine, Lunesta no response, venlafaxine,  Emsam 12 mg for 2 months, trazodone 200 mg,  methylphenidate 60 mg, bupropion was side effects,  Strattera,  Lexapro 20 mg, , modafinil, sertraline, paroxetine, Deplin, pramipexole, Vyvanse, Concerta, amantadine ,Patient prone to akathisia. Xanax, clonazepam.  Review of Systems:  Review of Systems  Musculoskeletal: Positive for back pain.  Neurological: Positive for tremors. Negative for dizziness, weakness and headaches.       Fidgety  Psychiatric/Behavioral: Positive for decreased concentration and depression. Negative for agitation, behavioral problems, confusion, dysphoric mood, hallucinations, self-injury, sleep disturbance and suicidal ideas. The patient is nervous/anxious and is hyperactive.     Medications: I have reviewed the patient's current medications.  Current Outpatient Medications  Medication Sig Dispense Refill  . esomeprazole (NEXIUM) 20 MG capsule Take by mouth.    . fenofibrate (TRICOR) 145 MG tablet Take by mouth.    [name] FLUoxetine (PROZAC) 20 MG capsule Take 3 capsules (60 mg total) by mouth daily. 90 capsule 1  . fluticasone (FLONASE) 50 MCG/ACT nasal spray     . levothyroxine (SYNTHROID, LEVOTHROID) 75 MCG tablet Take by mouth.    . lithium carbonate 150 MG capsule TAKE 1 CAPSULE BY MOUTH AT BEDTIME 90 capsule 0  . lithium carbonate 300 MG capsule TAKE 2 CAPSULES BY MOUTH EVERY DAY 180 capsule 1  . LORazepam (ATIVAN)  2 MG tablet TAKE ONE TABLET EVERY MORNING 1/2 TABLETAT NOON TAKE ONE TABLET 6PM AND 1/2 TABLET AT BEDTIME (Patient taking differently: Take 2 mg by mouth in the morning, at noon, and at bedtime. ) 90 tablet 2  . meclizine (ANTIVERT) 25 MG tablet Take 1 tablet (25 mg total) by mouth 3 (three) times daily as needed for dizziness. 20 tablet 0  . OLANZapine (ZYPREXA) 10 MG tablet TAKE ONE TABLET AT BEDTIME 30 tablet 0  . ondansetron (ZOFRAN ODT) 4 MG disintegrating tablet Take 1 tablet (4 mg total) by mouth every 8 (eight) hours as needed for nausea or vomiting. 20 tablet 0  . QUEtiapine (SEROQUEL) 300 MG tablet TAKE ONE TABLET AT BEDTIME AS NEEDED FORSEVERE INSOMNIA 30 tablet 2  . QUEtiapine (SEROQUEL) 400 MG tablet 2 each night 60 tablet 2  . sildenafil (REVATIO) 20 MG tablet Take 5 tablets (100 mg total) by mouth as needed. 30 tablet  11  . tamsulosin (FLOMAX) 0.4 MG CAPS capsule Take 2 capsules (0.8 mg total) by mouth daily after supper. (Patient taking differently: Take 0.4 mg by mouth daily after supper. ) 30 capsule 11  . OLANZapine (ZYPREXA) 7.5 MG tablet TAKE 1 TABLET AT BEDTIME (Patient not taking: Reported on 06/28/2019) 30 tablet 2   No current facility-administered medications for this visit.   Medication Side Effects: Other: mild sleepiness.  Occ twitches.  Allergies:  Allergies  Allergen Reactions  . Meloxicam Other (See Comments)    Other reaction(s): Dizziness  . Prednisone   . Wellbutrin [Bupropion]     Past Medical History:  Diagnosis Date  . Anxiety   . Bipolar disorder (HCC)   . Depression   . GERD (gastroesophageal reflux disease)     Family History  Problem Relation Age of Onset  . Prostate cancer Neg Hx   . Bladder Cancer Neg Hx   . Kidney cancer Neg Hx     Social History   Socioeconomic History  . Marital status: Single    Spouse name: Not on file  . Number of children: Not on file  . Years of education: Not on file  . Highest education level: Not on  file  Occupational History  . Not on file  Tobacco Use  . Smoking status: Former Games developermoker  . Smokeless tobacco: Never Used  Substance and Sexual Activity  . Alcohol use: Not Currently  . Drug use: Never  . Sexual activity: Not on file  Other Topics Concern  . Not on file  Social History Narrative  . Not on file   Social Determinants of Health   Financial Resource Strain:   . Difficulty of Paying Living Expenses: Not on file  Food Insecurity:   . Worried About Programme researcher, broadcasting/film/videounning Out of Food in the Last Year: Not on file  . Ran Out of Food in the Last Year: Not on file  Transportation Needs:   . Lack of Transportation (Medical): Not on file  . Lack of Transportation (Non-Medical): Not on file  Physical Activity:   . Days of Exercise per Week: Not on file  . Minutes of Exercise per Session: Not on file  Stress:   . Feeling of Stress : Not on file  Social Connections:   . Frequency of Communication with Friends and Family: Not on file  . Frequency of Social Gatherings with Friends and Family: Not on file  . Attends Religious Services: Not on file  . Active Member of Clubs or Organizations: Not on file  . Attends BankerClub or Organization Meetings: Not on file  . Marital Status: Not on file  Intimate Partner Violence:   . Fear of Current or Ex-Partner: Not on file  . Emotionally Abused: Not on file  . Physically Abused: Not on file  . Sexually Abused: Not on file    Past Medical History, Surgical history, Social history, and Family history were reviewed and updated as appropriate.   Please see review of systems for further details on the patient's review from today.   Objective:   Physical Exam:  There were no vitals taken for this visit.  Physical Exam Constitutional:      General: He is not in acute distress.    Appearance: He is well-developed.  Musculoskeletal:        General: No deformity.  Neurological:     Mental Status: He is alert and oriented to person, place, and time.      Cranial  Nerves: No dysarthria.     Coordination: Coordination normal.  Psychiatric:        Attention and Perception: Attention and perception normal. He does not perceive auditory or visual hallucinations.        Mood and Affect: Mood is anxious and depressed. Affect is not labile, blunt, angry, tearful or inappropriate.        Speech: Speech normal. Speech is not slurred.        Behavior: Behavior normal. Behavior is not slowed. Behavior is cooperative.        Thought Content: Thought content is not paranoid or delusional. Thought content includes suicidal ideation. Thought content does not include homicidal ideation. Thought content does not include homicidal or suicidal plan.        Cognition and Memory: Cognition normal. He exhibits impaired recent memory.        Judgment: Judgment normal.     Comments: Insight fair Memory is better than it was but still complains He is chronically anxious and somewhat needy. No manic episodes noted. Depression and anxiety are about the same as last visit Less fidgety than usual. Some wordfinding problems     Lab Review:     Component Value Date/Time   NA 137 04/18/2019 1612   NA 142 03/20/2014 0514   K 3.9 04/18/2019 1612   K 4.1 03/20/2014 0514   CL 106 04/18/2019 1612   CL 112 (H) 03/20/2014 0514   CO2 20 (L) 04/18/2019 1612   CO2 26 03/20/2014 0514   GLUCOSE 148 (H) 04/18/2019 1612   GLUCOSE 99 03/20/2014 0514   BUN 13 04/18/2019 1612   BUN 7 03/20/2014 0514   CREATININE 1.63 (H) 04/18/2019 1612   CREATININE 0.78 03/20/2014 0514   CALCIUM 9.5 04/18/2019 1612   CALCIUM 9.5 03/20/2014 0514   PROT 7.8 04/18/2019 1612   PROT 7.3 03/18/2014 1459   ALBUMIN 4.6 04/18/2019 1612   ALBUMIN 3.2 (L) 03/18/2014 1459   AST 24 04/18/2019 1612   AST 16 03/18/2014 1459   ALT 18 04/18/2019 1612   ALT 16 03/18/2014 1459   ALKPHOS 40 04/18/2019 1612   ALKPHOS 105 03/18/2014 1459   BILITOT 0.7 04/18/2019 1612   BILITOT 0.3 03/18/2014 1459    GFRNONAA 47 (L) 04/18/2019 1612   GFRNONAA >60 03/20/2014 0514   GFRAA 54 (L) 04/18/2019 1612   GFRAA >60 03/20/2014 0514       Component Value Date/Time   WBC 6.4 04/18/2019 1612   RBC 4.69 04/18/2019 1612   HGB 14.6 04/18/2019 1612   HGB 12.6 (L) 03/20/2014 0514   HCT 44.8 04/18/2019 1612   HCT 38.4 (L) 03/20/2014 0514   PLT 406 (H) 04/18/2019 1612   PLT 346 03/20/2014 0514   MCV 95.5 04/18/2019 1612   MCV 89 03/20/2014 0514   MCH 31.1 04/18/2019 1612   MCHC 32.6 04/18/2019 1612   RDW 12.8 04/18/2019 1612   RDW 12.5 03/20/2014 0514   LYMPHSABS 3.2 03/20/2014 0514   MONOABS 1.0 03/20/2014 0514   EOSABS 0.4 03/20/2014 0514   BASOSABS 0.1 03/20/2014 0514    Lithium Lvl  Date Value Ref Range Status  03/13/2019 0.8 0.6 - 1.2 mmol/L Final    Comment:                                     Detection Limit = 0.1                           <  0.1 indicates None Detected     Last lithium level Sept 0.8.   Last lithium level July 27, 2018 was normal at 1.0.   Lithium level [facility] October 03, 2018 = 1.2. Said he got lithium level at [facility] as requested.  Labs not in Epic.  Recent lipids ok except higher TG than usual.  Normal A1C.  No results found for: PHENYTOIN, PHENOBARB, VALPROATE, CBMZ   .res Assessment: Plan:    Albert [name] was seen today for manic behavior, depression, follow-up and medication problem.  Diagnoses and all orders for this visit:  Severe bipolar I disorder, current or most recent episode depressed (HCC)  Generalized anxiety disorder  Panic disorder with agoraphobia  Forgetfulness  Insomnia due to mental condition  Lithium use    Chronic TR bipolar mixed and chronic anxiety.  He usually has mixed bipolar symptoms which we have not been able to completely eliminate.   Not much subjective change since here.  He is having less hyperactive or manic symptoms.  See long list of meds tried.   He is somewhat chronically unstable and recently has been worse because  of problems related to benzodiazepines specifically Xanax from which she was having side effects those have resolved.[name]  He is cognition does seem better with the switch from alprazolam to lorazepam, per the patient and his mother..   We discussed the short-term risks associated with benzodiazepines including sedation and increased fall risk among others.  Discussed long-term side effect risk including dependence, potential withdrawal symptoms, and the potential eventual dose-related risk of dementia. Disc newer studies refute dementia risks. No change in lorazepam 2mg  TID.  Prefer the lowest dose for cognitive reasons.  Unfortunately due to the severity of set his symptoms polypharmacy is a necessity.  Answered his questions about any new psychiatric meds.  We discussed Caplyta but it is not FDA approved for bipolar disorder at this time.  But we will consider this.  His sleep is better at the moment but that may be because he is more depressed.  Per his request OK to continue olanzapine to 10 mg HS Disc unusual combo antipsychotics but it helps more than other options.  Discussed potential metabolic side effects associated with atypical antipsychotics, as well as potential risk for movement side effects. Advised pt to contact office if movement side effects occur.  He had some falling around the time when he previously took olanzapine but he took it for several months at this dosage before he had any side effect issues and he was on Xanax at the time that the following occurred. Take LED Seroquel to sleep.   No obvious alternatives for sleep  Discussed safety plan at length with patient.  Advised patient to contact office with any worsening signs and symptoms.  Instructed patient to go to the [facility] emergency room for evaluation if experiencing any acute safety concerns, to include suicidal intent. "I don't want to do it" .  Call if there is any worsening.   Lithium being used bc chronic SI  and death thoughts.   Counseled patient regarding potential benefits, risks, and side effects of lithium to include potential risk of lithium affecting thyroid and renal function.  Discussed need for periodic lab monitoring to determine drug level and to assess for potential adverse effects.  Counseled patient regarding signs and symptoms of lithium toxicity and advised that they notify office immediately or seek urgent medical attention if experiencing these signs and symptoms.  Patient advised to contact  office with any questions or concerns. Reduce  lithium to 2 of the 300 mg capsules. Lithium level 0.8.  On 750 mg daily.   Call if death thoughts worsen or worsening SI. Prefer only 1 med change at a time. If nothing worse will decrease lithium again DT kidney concerns  Continue Increase fluoxetine 60 mg daily (about early Jan 2021)   to use the combo with olanzapine for TR bipolar depression.  Discussed side effect risk including suicidal worsening and if that happens contact us immediately.  This has not led to noticeable subjective changes.  This appt was 30 mins.  FU 7-8 weeks   Meredith Staggers, MD, DFAPA  Future Appointments  Date Time Provider Department Center  12/24/2019  9:00 AM [facility] None  12/31/2019  9:45 AM Sondra Come, MD [facility] None    No orders of the defined types were placed in this encounter.     -------------------------------

## 2019-07-03 DIAGNOSIS — N2581 Secondary hyperparathyroidism of renal origin: Secondary | ICD-10-CM | POA: Insufficient documentation

## 2019-07-16 ENCOUNTER — Other Ambulatory Visit: Payer: Self-pay | Admitting: Psychiatry

## 2019-07-16 DIAGNOSIS — F314 Bipolar disorder, current episode depressed, severe, without psychotic features: Secondary | ICD-10-CM

## 2019-07-16 DIAGNOSIS — F411 Generalized anxiety disorder: Secondary | ICD-10-CM

## 2019-07-16 DIAGNOSIS — F4001 Agoraphobia with panic disorder: Secondary | ICD-10-CM

## 2019-07-16 DIAGNOSIS — F3162 Bipolar disorder, current episode mixed, moderate: Secondary | ICD-10-CM

## 2019-07-17 NOTE — Telephone Encounter (Signed)
Please submit I was unable to reach [facility] to call in Apt back in April

## 2019-07-19 DIAGNOSIS — Z0289 Encounter for other administrative examinations: Secondary | ICD-10-CM

## 2019-08-14 ENCOUNTER — Other Ambulatory Visit: Payer: Self-pay | Admitting: Urology

## 2019-08-14 DIAGNOSIS — N401 Enlarged prostate with lower urinary tract symptoms: Secondary | ICD-10-CM

## 2019-08-27 ENCOUNTER — Ambulatory Visit (INDEPENDENT_AMBULATORY_CARE_PROVIDER_SITE_OTHER): Payer: Medicare PPO | Admitting: Psychiatry

## 2019-08-27 ENCOUNTER — Encounter: Payer: Self-pay | Admitting: Psychiatry

## 2019-08-27 ENCOUNTER — Other Ambulatory Visit: Payer: Self-pay

## 2019-08-27 DIAGNOSIS — T753XXD Motion sickness, subsequent encounter: Secondary | ICD-10-CM

## 2019-08-27 DIAGNOSIS — F411 Generalized anxiety disorder: Secondary | ICD-10-CM

## 2019-08-27 DIAGNOSIS — R6889 Other general symptoms and signs: Secondary | ICD-10-CM | POA: Diagnosis not present

## 2019-08-27 DIAGNOSIS — F314 Bipolar disorder, current episode depressed, severe, without psychotic features: Secondary | ICD-10-CM | POA: Diagnosis not present

## 2019-08-27 DIAGNOSIS — Z79899 Other long term (current) drug therapy: Secondary | ICD-10-CM

## 2019-08-27 DIAGNOSIS — F4001 Agoraphobia with panic disorder: Secondary | ICD-10-CM | POA: Diagnosis not present

## 2019-08-27 DIAGNOSIS — F5105 Insomnia due to other mental disorder: Secondary | ICD-10-CM

## 2019-08-27 MED ORDER — MECLIZINE HCL 25 MG PO TABS: 25.0000 mg | ORAL_TABLET | Freq: Three times a day (TID) | ORAL | 0 refills | Status: DC | PRN

## 2019-08-27 NOTE — Progress Notes (Signed)
Albert [name] [id number] 06-01-1965 54 y.o.      Subjective:   Patient ID:  Albert [name] is a 54 y.o. (DOB 1966/02/02) male.  Chief Complaint:  Chief Complaint  Patient presents with  . Follow-up  . Depression  . Anxiety    Depression        Associated symptoms include decreased concentration.  Associated symptoms include no headaches and no suicidal ideas.  Past medical history includes anxiety.   Anxiety Symptoms include decreased concentration and nervous/anxious behavior. Patient reports no confusion, dizziness or suicidal ideas.      Albert [name] presents to the office today for follow-up of mixed bipolar, anxiety and still grieving stress of breakup.  He requires frequent follow-up because of long-term symptoms.  At visit October 25, 2018.  We made several medicine changes because of his concerns about sleep and other issues.  We increased olanzapine back to 7.5 mg bc not sleeping as well at 5 mg.  He has to have the prescription for quetiapine written for up to 3 tablets at night in case insomnia is worse because insomnia makes his mood disorder so much worse.  We discussed that was above the usual max but the request was granted given his treatment resistant status. We also reduce lithium from 900 mg daily to 750 mg daily to try to reduce tremor and muscle twitches.  At  visit March 28, 2019.  Fluoxetine was increased to 40 mg daily.   Early January 2021 increased to 60 mg daily.  No SE.  Last see June 28, 2019.  No further meds were changed except olanzapine was increased back to 10 mg daily to see if if he could get additional benefit per his request.  Covid vaccinated.  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  Then had cholecystectomy.  2 episodes night sweats lately.  Memory has been very bad lately.  Repeatedly asks mother questions. Still  tend to stay in his room and his bed.  Still has rapid cycling mood swings.  Maybe some better with increase in olanzapine to 10 and  tolerating itl.  Would like to get out more but can't DT Covid.  Can perform necessary chores.  Will get out of the house when he can.  No change in death thoughts and anxiety in intensity but is better with frequency.  Usually comes and goes in waves but more persistent.  Consistent with meds.  Ativan not helping anxiety very dramatically but he's not sure.  Fidgety.  No trigger other than still grieving relationship and can't get it out of his head.  Poor energy, concentration and more forgetful. In bed more and less active.  Sleep ok lately which is unusual.  Can concentrate on financial matters and stock market at times.  Tolerating meds.  Still some intrusive SI without reason.  Less frequent obsessive thoughts about broken relationship and has been doing this for months.  Can't let it go.  Loop.   No unusual stress even with the family who is supportive.  Likes the benefit that Zyprexa gives.  No sleepwalking nor falling nor odd behavior.  No history of sleep walking.  He understands this may recur with an increase.  Also wants option to rarely take extra quetiapine 300 for sleep prn.  Still some memory issues.  sleep 5-6 hours nightly.  Trying to stay out of bed when watching TV.  Some awakening but managing.  Not as much napping daytime since increase fluoxetine  to 60.  Trying to reduce quetiapine to 600 mg nightly and cannot always make that work for sleep.  Depression is moderate.  Always spends a lot of time just laying around.   Can have anxiety for no reason like coming here, and varies in intensity without pattern.  Overall anxiety seems a little worse.    Less panic than in the past.  Some chronic depression and hyperactivity and loudness and hyperverbal.  Usually back to sleep.  Total 6 hours but some napping.  Still lacks interest and motivation.  Can follow a TV show if interested.  F still works at [employer].  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  Psych med hx extensive including ECT  and  risperidone, lithium 1200 SE, Zyprexa 20-15 akathisia, Latuda 80 which caused akathisia, Abilify, Vraylar, Rexulti, aripiprazole 20 mg with akathisia,Seroquel 1000 mg,  InVega, Geodon, lamotrigine 300 mg, Depakote 2000 mg, Saphris with side effects, symbyax,Tegretol,Trileptal and several of these in combinations, Fanapt NR . N-acetylcysteine, Nuedexta,  Belsomra with no response, Viibryd 40 mg for 3 months with diarrhea, protriptyline with side effects, clonidine, Trintellix 20 mg, Parnate 50 mg with no response, gabapentin, imipramine, Lunesta no response, venlafaxine,  Emsam 12 mg for 2 months, trazodone 200 mg,  methylphenidate 60 mg, bupropion was side effects,  Strattera,  Lexapro 20 mg, , modafinil, sertraline, paroxetine, Deplin, pramipexole, Vyvanse, Concerta, amantadine ,Patient prone to akathisia. Xanax, clonazepam.  Review of Systems:  Review of Systems  Musculoskeletal: Positive for back pain.  Neurological: Positive for tremors. Negative for dizziness, weakness and headaches.       Fidgety  Psychiatric/Behavioral: Positive for decreased concentration and depression. Negative for agitation, behavioral problems, confusion, dysphoric mood, hallucinations, self-injury, sleep disturbance and suicidal ideas. The patient is nervous/anxious and is hyperactive.     Medications: I have reviewed the patient's current medications.  Current Outpatient Medications  Medication Sig Dispense Refill  . esomeprazole (NEXIUM) 20 MG capsule Take by mouth.    . fenofibrate (TRICOR) 145 MG tablet Take by mouth.    [name] FLUoxetine (PROZAC) 20 MG capsule Take 3 capsules (60 mg total) by mouth daily. 90 capsule 1  . fluticasone (FLONASE) 50 MCG/ACT nasal spray     . levothyroxine (SYNTHROID, LEVOTHROID) 75 MCG tablet Take by mouth.    . lithium carbonate 300 MG capsule TAKE 2 CAPSULES BY MOUTH EVERY DAY 180 capsule 1  . LORazepam (ATIVAN) 2 MG tablet TAKE 1 TABLET EVERY MORNING 1/2 TABLET AT NOON 1  TABLET AT 6PM AND 1/2 TABLET AT BEDTIME 90 tablet 1  . meclizine (ANTIVERT) 25 MG tablet Take 1 tablet (25 mg total) by mouth 3 (three) times daily as needed for dizziness. 30 tablet 0  . OLANZapine (ZYPREXA) 10 MG tablet TAKE ONE TABLET AT BEDTIME 30 tablet 5  . ondansetron (ZOFRAN ODT) 4 MG disintegrating tablet Take 1 tablet (4 mg total) by mouth every 8 (eight) hours as needed for nausea or vomiting. 20 tablet 0  . QUEtiapine (SEROQUEL) 400 MG tablet TAKE 2 TABLETS BY MOUTH AT BEDTIME 60 tablet 2  . sildenafil (REVATIO) 20 MG tablet Take 5 tablets (100 mg total) by mouth as needed. 30 tablet 11  . tamsulosin (FLOMAX) 0.4 MG CAPS capsule TAKE 2 CAPSULES BY MOUTH EVERY DAY AFTERSUPPER 60 capsule 3  . QUEtiapine (SEROQUEL) 300 MG tablet TAKE ONE TABLET AT BEDTIME AS NEEDED FORSEVERE INSOMNIA (Patient not taking: Reported on 08/27/2019) 30 tablet 2   No current facility-administered medications for  this visit.   Medication Side Effects: Other: mild sleepiness.  Occ twitches.  Allergies:  Allergies  Allergen Reactions  . Meloxicam Other (See Comments)    Other reaction(s): Dizziness  . Prednisone   . Wellbutrin [Bupropion]     Past Medical History:  Diagnosis Date  . Anxiety   . Bipolar disorder (HCC)   . Depression   . GERD (gastroesophageal reflux disease)     Family History  Problem Relation Age of Onset  . Prostate cancer Neg Hx   . Bladder Cancer Neg Hx   . Kidney cancer Neg Hx     Social History   Socioeconomic History  . Marital status: Single    Spouse name: Not on file  . Number of children: Not on file  . Years of education: Not on file  . Highest education level: Not on file  Occupational History  . Not on file  Tobacco Use  . Smoking status: Former Games developer  . Smokeless tobacco: Never Used  Substance and Sexual Activity  . Alcohol use: Not Currently  . Drug use: Never  . Sexual activity: Not on file  Other Topics Concern  . Not on file  Social History  Narrative  . Not on file   Social Determinants of Health   Financial Resource Strain:   . Difficulty of Paying Living Expenses:   Food Insecurity:   . Worried About Programme researcher, broadcasting/film/video in the Last Year:   . Barista in the Last Year:   Transportation Needs:   . Freight forwarder (Medical):   [name] Lack of Transportation (Non-Medical):   Physical Activity:   . Days of Exercise per Week:   . Minutes of Exercise per Session:   Stress:   . Feeling of Stress :   Social Connections:   . Frequency of Communication with Friends and Family:   . Frequency of Social Gatherings with Friends and Family:   . Attends Religious Services:   . Active Member of Clubs or Organizations:   . Attends Banker Meetings:   [name] Marital Status:   Intimate Partner Violence:   . Fear of Current or Ex-Partner:   . Emotionally Abused:   [name] Physically Abused:   . Sexually Abused:     Past Medical History, Surgical history, Social history, and Family history were reviewed and updated as appropriate.   Please see review of systems for further details on the patient's review from today.   Objective:   Physical Exam:  There were no vitals taken for this visit.  Physical Exam Constitutional:      General: He is not in acute distress.    Appearance: He is well-developed.  Musculoskeletal:        General: No deformity.  Neurological:     Mental Status: He is alert and oriented to person, place, and time.     Cranial Nerves: No dysarthria.     Coordination: Coordination normal.  Psychiatric:        Attention and Perception: Attention and perception normal. He does not perceive auditory or visual hallucinations.        Mood and Affect: Mood is anxious and depressed. Affect is not labile, blunt, angry, tearful or inappropriate.        Speech: Speech normal. Speech is not slurred.        Behavior: Behavior normal. Behavior is not slowed. Behavior is cooperative.        Thought Content:  Thought  content is not paranoid or delusional. Thought content includes suicidal ideation. Thought content does not include homicidal ideation. Thought content does not include homicidal or suicidal plan.        Cognition and Memory: Cognition normal. He exhibits impaired recent memory.        Judgment: Judgment normal.     Comments: Insight fair Memory is better than it was but still complains He is chronically anxious and somewhat needy. No manic episodes noted. Depression and anxiety are about the same as last visit Less fidgety than usual. Some wordfinding problems     Lab Review:     Component Value Date/Time   NA 137 04/18/2019 1612   NA 142 03/20/2014 0514   K 3.9 04/18/2019 1612   K 4.1 03/20/2014 0514   CL 106 04/18/2019 1612   CL 112 (H) 03/20/2014 0514   CO2 20 (L) 04/18/2019 1612   CO2 26 03/20/2014 0514   GLUCOSE 148 (H) 04/18/2019 1612   GLUCOSE 99 03/20/2014 0514   BUN 13 04/18/2019 1612   BUN 7 03/20/2014 0514   CREATININE 1.63 (H) 04/18/2019 1612   CREATININE 0.78 03/20/2014 0514   CALCIUM 9.5 04/18/2019 1612   CALCIUM 9.5 03/20/2014 0514   PROT 7.8 04/18/2019 1612   PROT 7.3 03/18/2014 1459   ALBUMIN 4.6 04/18/2019 1612   ALBUMIN 3.2 (L) 03/18/2014 1459   AST 24 04/18/2019 1612   AST 16 03/18/2014 1459   ALT 18 04/18/2019 1612   ALT 16 03/18/2014 1459   ALKPHOS 40 04/18/2019 1612   ALKPHOS 105 03/18/2014 1459   BILITOT 0.7 04/18/2019 1612   BILITOT 0.3 03/18/2014 1459   GFRNONAA 47 (L) 04/18/2019 1612   GFRNONAA >60 03/20/2014 0514   GFRAA 54 (L) 04/18/2019 1612   GFRAA >60 03/20/2014 0514       Component Value Date/Time   WBC 6.4 04/18/2019 1612   RBC 4.69 04/18/2019 1612   HGB 14.6 04/18/2019 1612   HGB 12.6 (L) 03/20/2014 0514   HCT 44.8 04/18/2019 1612   HCT 38.4 (L) 03/20/2014 0514   PLT 406 (H) 04/18/2019 1612   PLT 346 03/20/2014 0514   MCV 95.5 04/18/2019 1612   MCV 89 03/20/2014 0514   MCH 31.1 04/18/2019 1612   MCHC 32.6  04/18/2019 1612   RDW 12.8 04/18/2019 1612   RDW 12.5 03/20/2014 0514   LYMPHSABS 3.2 03/20/2014 0514   MONOABS 1.0 03/20/2014 0514   EOSABS 0.4 03/20/2014 0514   BASOSABS 0.1 03/20/2014 0514    Lithium Lvl  Date Value Ref Range Status  03/13/2019 0.8 0.6 - 1.2 mmol/L Final    Comment:                                     Detection Limit = 0.1                           <0.1 indicates None Detected     Last lithium level Sept 0.8.   Last lithium level July 27, 2018 was normal at 1.0.   Lithium level [facility] October 03, 2018 = 1.2. Said he got lithium level at [facility] as requested.  Labs not in Epic.  Recent lipids ok except higher TG than usual.  Normal A1C.  No results found for: PHENYTOIN, PHENOBARB, VALPROATE, CBMZ   .res Assessment: Plan:    Albert [name] was  seen today for follow-up, depression and anxiety.  Diagnoses and all orders for this visit:  Severe bipolar I disorder, current or most recent episode depressed (HCC)  Generalized anxiety disorder  Panic disorder with agoraphobia  Forgetfulness  Insomnia due to mental condition  Lithium use  Motion sickness, subsequent encounter -     meclizine (ANTIVERT) 25 MG tablet; Take 1 tablet (25 mg total) by mouth 3 (three) times daily as needed for dizziness.    Chronic TR bipolar mixed and chronic anxiety.  He usually has mixed bipolar symptoms which we have not been able to completely eliminate.   Not much subjective change since here.  He is having less hyperactive or manic symptoms.  See long list of meds tried.   He is somewhat chronically unstable and recently has been worse because of problems related to benzodiazepines specifically Xanax from which she was having side effects those have resolved.[name].  He is cognition does seem better with the switch from alprazolam to lorazepam, per the patient and his mother but is gradually getting worse again..   We discussed the short-term risks associated with benzodiazepines including  sedation and increased fall risk among others.  Discussed long-term side effect risk including dependence, potential withdrawal symptoms, and the potential eventual dose-related risk of dementia. Disc newer studies refute dementia risks.  Unfortunately due to the severity of set his symptoms polypharmacy is a necessity.  Answered his questions about any new psychiatric meds.  We discussed Caplyta but it is not FDA approved for bipolar disorder at this time.  But we will consider this.  His sleep is better at the moment but that may be because he is more depressed.  Per his request OK to continue olanzapine to 10 mg HS Disc unusual combo antipsychotics but it helps more than other options.  Discussed potential metabolic side effects associated with atypical antipsychotics, as well as potential risk for movement side effects. Advised pt to contact office if movement side effects occur.  He had some falling around the time when he previously took olanzapine but he took it for several months at this dosage before he had any side effect issues and he was on Xanax at the time that the following occurred. Take LED Seroquel to sleep.   No obvious alternatives for sleep  Discussed safety plan at length with patient.  Advised patient to contact office with any worsening signs and symptoms.  In[facility]ed [facility] for [facility] if experiencing any acute safety concerns, to include suicidal intent. "I don't want to do it" .  Call if there is any worsening.   Lithium being used bc chronic SI and death thoughts.   Counseled patient regarding potential benefits, risks, and side effects of lithium to include potential risk of lithium affecting thyroid and renal function.  Discussed need for periodic lab monitoring to determine drug level and to assess for potential adverse effects.  Counseled patient regarding signs and symptoms of lithium toxicity and advised that they notify  office immediately or seek urgent medical attention if experiencing these signs and symptoms.  Patient advised to contact office with any questions or concerns. Continue lithium to 2 of the 300 mg capsules. Lithium level 0.8.  On 750 mg daily.   Call if death thoughts worsen or worsening SI. Prefer only 1 med change at a time. If nothing worse will decrease lithium again DT kidney concerns  Continue Increase fluoxetine 60 mg daily (about early  Jan 2021)   to use the combo with olanzapine for TR bipolar depression.  Discussed side effect risk including suicidal worsening and if that happens contact us immediately.  This has not led to noticeable subjective changes.  Try to reduce quetiapine to 400-600 mg HS instead of 800 mg for sleep.   Option reduce lorazepam again due to memory problems.  Triggered withdrawal in the past bc reduced too much.Not ideal to be taking so much antipsychotic.  Reduce lorazepam to 1 tablet in the morning, 1/2 tablet midday, and 1 tablet in the evening to see if memory is better.  We discussed the short-term risks associated with benzodiazepines including sedation and increased fall risk among others.  Discussed long-term side effect risk including dependence, potential withdrawal symptoms, and the potential eventual dose-related risk of dementia.  But recent studies from 2020 dispute this association between benzodiazepines and dementia risk. Newer studies in 2020 do not support an association with dementia.  Try to reduce Quetiapine to 400-600 mg at night for sleep.  The olanzapine should help sleep enough that you don't need as much quetiapine.   Asks for prn meclizine for dizziness.  History vertigo and motion sickness.    This appt was 30 mins.  FU 7-8 weeks   Meredith Staggers, MD, DFAPA  Future Appointments  Date Time Provider Department Center  12/24/2019  9:00 AM [facility] None  12/31/2019  9:45 AM Sondra Come, MD [facility] None    No orders of the  defined types were placed in this encounter.     -------------------------------

## 2019-08-27 NOTE — Patient Instructions (Signed)
Reduce lorazepam to 1 tablet in the morning, 1/2 tablet midday, and 1 tablet in the evening to see if memory is better.    Try to reduce  Quetiapine to 400-600 mg at night for sleep.  The olanzapine should help sleep enough that you don't need as much quetiapine.

## 2019-09-17 ENCOUNTER — Other Ambulatory Visit: Payer: Self-pay | Admitting: Psychiatry

## 2019-09-17 DIAGNOSIS — F314 Bipolar disorder, current episode depressed, severe, without psychotic features: Secondary | ICD-10-CM

## 2019-09-17 DIAGNOSIS — F4001 Agoraphobia with panic disorder: Secondary | ICD-10-CM

## 2019-09-17 DIAGNOSIS — F411 Generalized anxiety disorder: Secondary | ICD-10-CM

## 2019-09-18 NOTE — Telephone Encounter (Signed)
Dose changes at last visit on 04/26

## 2019-10-19 ENCOUNTER — Other Ambulatory Visit: Payer: Self-pay | Admitting: Psychiatry

## 2019-10-19 DIAGNOSIS — F3162 Bipolar disorder, current episode mixed, moderate: Secondary | ICD-10-CM

## 2019-10-22 ENCOUNTER — Encounter: Payer: Self-pay | Admitting: Psychiatry

## 2019-10-22 ENCOUNTER — Ambulatory Visit: Payer: Medicare PPO | Admitting: Psychiatry

## 2019-10-22 ENCOUNTER — Other Ambulatory Visit: Payer: Self-pay

## 2019-10-22 ENCOUNTER — Ambulatory Visit (INDEPENDENT_AMBULATORY_CARE_PROVIDER_SITE_OTHER): Payer: Medicare PPO | Admitting: Psychiatry

## 2019-10-22 DIAGNOSIS — F411 Generalized anxiety disorder: Secondary | ICD-10-CM | POA: Diagnosis not present

## 2019-10-22 DIAGNOSIS — R6889 Other general symptoms and signs: Secondary | ICD-10-CM | POA: Diagnosis not present

## 2019-10-22 DIAGNOSIS — F5105 Insomnia due to other mental disorder: Secondary | ICD-10-CM

## 2019-10-22 DIAGNOSIS — Z79899 Other long term (current) drug therapy: Secondary | ICD-10-CM

## 2019-10-22 DIAGNOSIS — F314 Bipolar disorder, current episode depressed, severe, without psychotic features: Secondary | ICD-10-CM

## 2019-10-22 DIAGNOSIS — F4001 Agoraphobia with panic disorder: Secondary | ICD-10-CM | POA: Diagnosis not present

## 2019-10-22 NOTE — Progress Notes (Signed)
Albert [name] [id number] April 12, 1966 54 y.o.      Subjective:   Patient ID:  Albert [name] is a 54 y.o. (DOB 1965/10/11) male.  Chief Complaint:  Chief Complaint  Patient presents with  . Follow-up    Mood   . Sleeping Problem  . Anxiety    Depression        Associated symptoms include decreased concentration.  Associated symptoms include no headaches and no suicidal ideas.  Past medical history includes anxiety.   Anxiety Symptoms include decreased concentration and nervous/anxious behavior. Patient reports no confusion, dizziness or suicidal ideas.      Albert [name] presents to the office today for follow-up of mixed bipolar, anxiety and still grieving stress of breakup.  He requires frequent follow-up because of long-term symptoms.  At visit October 25, 2018.  We made several medicine changes because of his concerns about sleep and other issues.  We increased olanzapine back to 7.5 mg bc not sleeping as well at 5 mg.  He has to have the prescription for quetiapine written for up to 3 tablets at night in case insomnia is worse because insomnia makes his mood disorder so much worse.  We discussed that was above the usual max but the request was granted given his treatment resistant status. We also reduce lithium from 900 mg daily to 750 mg daily to try to reduce tremor and muscle twitches.  At  visit March 28, 2019.  Fluoxetine was increased to 40 mg daily.   Early January 2021 increased to 60 mg daily.  No SE.  seen June 28, 2019.  No further meds were changed except olanzapine was increased back to 10 mg daily to see if if he could get additional benefit per his request.  Covid vaccinated.  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  Then had cholecystectomy.  April 2021 appt with the following noted: 2 episodes night sweats lately.  Memory has been very bad lately.  Repeatedly asks mother questions. Still  tend to stay in his room and his bed.  Still has rapid cycling mood swings.  Maybe  some better with increase in olanzapine to 10 and tolerating itl.  Would like to get out more but can't DT Covid.  Can perform necessary chores.  Will get out of the house when he can.  No change in death thoughts and anxiety in intensity but is better with frequency.  Usually comes and goes in waves but more persistent.  Consistent with meds.  Ativan not helping anxiety very dramatically but he's not sure.  Fidgety.  No trigger other than still grieving relationship and can't get it out of his head.  Poor energy, concentration and more forgetful. In bed more and less active.  Sleep ok lately which is unusual.  Can concentrate on financial matters and stock market at times.  Tolerating meds.  Still some intrusive SI without reason. Less frequent obsessive thoughts about broken relationship and has been doing this for months.  Can't let it go.  Loop.   No unusual stress even with the family who is supportive. Likes the benefit that Zyprexa gives. No sleepwalking nor falling nor odd behavior.  No history of sleep walking.  He understands this may recur with an increase.  Also wants option to rarely take extra quetiapine 300 for sleep prn. Plan:  Try to reduce lorazepam if possible.  10/22/2019 appointment with the following noted: Continues fluoxetine 60, lithium 600 mg, lorazepam 2 mg AM  and HS and 1 mg midday, olanzapine 10, quetiapine 600 mg HS. Still anxious chronically and including driving in crowded spaces.  Doesn't thing Ativan helps as well as Xanax but less cognitive problems. Sleep is not as good.  Occ EFA.  More EMA and wanting to do things in the middle of the night but this is not typical.  Wonders about why that happens.  Some napping.   Tolerating meds.  Asks about weight loss meds.  Disc this in detail.    Still some memory issues.  sleep 5-6 hours nightly.  Trying to stay out of bed when watching TV.  Some awakening but managing.  Not as much napping daytime since increase fluoxetine to  60.    Depression is moderate.  Always spends a lot of time just laying around.   Can have anxiety for no reason like coming here, and varies in intensity without pattern.  Overall anxiety seems a little worse.    Less panic than in the past.  Some chronic depression and hyperactivity and loudness and hyperverbal.  Usually back to sleep.  Total 6 hours but some napping.  Still lacks interest and motivation.  Can follow a TV show if interested.  F still works at [employer].  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  Psych med hx extensive including ECT and  risperidone, lithium 1200 SE, Zyprexa 20-15 akathisia, Latuda 80 which caused akathisia, Abilify, Vraylar, Rexulti, aripiprazole 20 mg with akathisia,Seroquel 1000 mg,  InVega, Geodon, lamotrigine 300 mg, Depakote 2000 mg, Saphris with side effects, symbyax,Tegretol,Trileptal and several of these in combinations, Fanapt NR . N-acetylcysteine, Nuedexta,  Belsomra with no response, Viibryd 40 mg for 3 months with diarrhea, protriptyline with side effects, clonidine, Trintellix 20 mg, Parnate 50 mg with no response, gabapentin, imipramine, Lunesta no response, venlafaxine,  Emsam 12 mg for 2 months, trazodone 200 mg,  methylphenidate 60 mg, bupropion was side effects,  Strattera,  Lexapro 20 mg, , modafinil, sertraline, paroxetine, Deplin, pramipexole, Vyvanse, Concerta, amantadine ,Patient prone to akathisia. Xanax, clonazepam.  Review of Systems:  Review of Systems  Musculoskeletal: Positive for back pain.  Neurological: Positive for tremors. Negative for dizziness, weakness and headaches.       Fidgety  Psychiatric/Behavioral: Positive for decreased concentration and depression. Negative for agitation, behavioral problems, confusion, dysphoric mood, hallucinations, self-injury, sleep disturbance and suicidal ideas. The patient is nervous/anxious and is hyperactive.     Medications: I have reviewed the patient's current medications.  Current  Outpatient Medications  Medication Sig Dispense Refill  . esomeprazole (NEXIUM) 20 MG capsule Take by mouth.    . fenofibrate (TRICOR) 145 MG tablet Take by mouth.    [name] FLUoxetine (PROZAC) 20 MG capsule TAKE 3 CAPSULES EVERY MORNING 90 capsule 1  . fluticasone (FLONASE) 50 MCG/ACT nasal spray     . levothyroxine (SYNTHROID, LEVOTHROID) 75 MCG tablet Take by mouth.    [name] lisinopril (ZESTRIL) 5 MG tablet Take 5 mg by mouth daily.    [name] lithium carbonate 300 MG capsule TAKE 2 CAPSULES BY MOUTH EVERY DAY 180 capsule 1  . LORazepam (ATIVAN) 2 MG tablet TAKE 1 TABLET EVERY MORNING 1/2 TABLET AT NOON 1 TABLET AT 6PM AND 1/2 TABLET AT BEDTIME 90 tablet 1  . meclizine (ANTIVERT) 25 MG tablet Take 1 tablet (25 mg total) by mouth 3 (three) times daily as needed for dizziness. 30 tablet 0  . OLANZapine (ZYPREXA) 10 MG tablet TAKE ONE TABLET AT BEDTIME 30 tablet 5  .  ondansetron (ZOFRAN ODT) 4 MG disintegrating tablet Take 1 tablet (4 mg total) by mouth every 8 (eight) hours as needed for nausea or vomiting. 20 tablet 0  . QUEtiapine (SEROQUEL) 300 MG tablet TAKE ONE TABLET AT BEDTIME AS NEEDED FORSEVERE INSOMNIA 30 tablet 2  . QUEtiapine (SEROQUEL) 400 MG tablet TAKE 2 TABLETS BY MOUTH AT BEDTIME 60 tablet 2  . sildenafil (REVATIO) 20 MG tablet Take 5 tablets (100 mg total) by mouth as needed. 30 tablet 11  . tamsulosin (FLOMAX) 0.4 MG CAPS capsule TAKE 2 CAPSULES BY MOUTH EVERY DAY AFTERSUPPER 60 capsule 3   No current facility-administered medications for this visit.   Medication Side Effects: Other: mild sleepiness.  Occ twitches.  Allergies:  Allergies  Allergen Reactions  . Meloxicam Other (See Comments)    Other reaction(s): Dizziness  . Prednisone   . Wellbutrin [Bupropion]     Past Medical History:  Diagnosis Date  . Anxiety   . Bipolar disorder (HCC)   . Depression   . GERD (gastroesophageal reflux disease)     Family History  Problem Relation Age of Onset  . Prostate cancer Neg  Hx   . Bladder Cancer Neg Hx   . Kidney cancer Neg Hx     Social History   Socioeconomic History  . Marital status: Single    Spouse name: Not on file  . Number of children: Not on file  . Years of education: Not on file  . Highest education level: Not on file  Occupational History  . Not on file  Tobacco Use  . Smoking status: Former Games developer  . Smokeless tobacco: Never Used  Vaping Use  . Vaping Use: Never used  Substance and Sexual Activity  . Alcohol use: Not Currently  . Drug use: Never  . Sexual activity: Not on file  Other Topics Concern  . Not on file  Social History Narrative  . Not on file   Social Determinants of Health   Financial Resource Strain:   . Difficulty of Paying Living Expenses:   Food Insecurity:   . Worried About Programme researcher, broadcasting/film/video in the Last Year:   . Barista in the Last Year:   Transportation Needs:   . Freight forwarder (Medical):   [name] Lack of Transportation (Non-Medical):   Physical Activity:   . Days of Exercise per Week:   . Minutes of Exercise per Session:   Stress:   . Feeling of Stress :   Social Connections:   . Frequency of Communication with Friends and Family:   . Frequency of Social Gatherings with Friends and Family:   . Attends Religious Services:   . Active Member of Clubs or Organizations:   . Attends Banker Meetings:   [name] Marital Status:   Intimate Partner Violence:   . Fear of Current or Ex-Partner:   . Emotionally Abused:   [name] Physically Abused:   . Sexually Abused:     Past Medical History, Surgical history, Social history, and Family history were reviewed and updated as appropriate.   Please see review of systems for further details on the patient's review from today.   Objective:   Physical Exam:  There were no vitals taken for this visit.  Physical Exam Constitutional:      General: He is not in acute distress.    Appearance: He is well-developed.  Musculoskeletal:         General: No deformity.  Neurological:  Mental Status: He is alert and oriented to person, place, and time.     Cranial Nerves: No dysarthria.     Coordination: Coordination normal.  Psychiatric:        Attention and Perception: Attention and perception normal. He does not perceive auditory or visual hallucinations.        Mood and Affect: Mood is anxious and depressed. Affect is not labile, blunt, angry, tearful or inappropriate.        Speech: Speech normal. Speech is not slurred.        Behavior: Behavior normal. Behavior is not slowed. Behavior is cooperative.        Thought Content: Thought content is not paranoid or delusional. Thought content includes suicidal ideation. Thought content does not include homicidal ideation. Thought content does not include homicidal or suicidal plan.        Cognition and Memory: Cognition normal. He exhibits impaired recent memory.        Judgment: Judgment normal.     Comments: Insight fair Memory is better than it was but still complains He is chronically anxious and somewhat needy. No manic episodes noted. Depression and anxiety are about the same as last visit Less fidgety than usual. Some wordfinding problems     Lab Review:     Component Value Date/Time   NA 137 04/18/2019 1612   NA 142 03/20/2014 0514   K 3.9 04/18/2019 1612   K 4.1 03/20/2014 0514   CL 106 04/18/2019 1612   CL 112 (H) 03/20/2014 0514   CO2 20 (L) 04/18/2019 1612   CO2 26 03/20/2014 0514   GLUCOSE 148 (H) 04/18/2019 1612   GLUCOSE 99 03/20/2014 0514   BUN 13 04/18/2019 1612   BUN 7 03/20/2014 0514   CREATININE 1.63 (H) 04/18/2019 1612   CREATININE 0.78 03/20/2014 0514   CALCIUM 9.5 04/18/2019 1612   CALCIUM 9.5 03/20/2014 0514   PROT 7.8 04/18/2019 1612   PROT 7.3 03/18/2014 1459   ALBUMIN 4.6 04/18/2019 1612   ALBUMIN 3.2 (L) 03/18/2014 1459   AST 24 04/18/2019 1612   AST 16 03/18/2014 1459   ALT 18 04/18/2019 1612   ALT 16 03/18/2014 1459   ALKPHOS 40  04/18/2019 1612   ALKPHOS 105 03/18/2014 1459   BILITOT 0.7 04/18/2019 1612   BILITOT 0.3 03/18/2014 1459   GFRNONAA 47 (L) 04/18/2019 1612   GFRNONAA >60 03/20/2014 0514   GFRAA 54 (L) 04/18/2019 1612   GFRAA >60 03/20/2014 0514       Component Value Date/Time   WBC 6.4 04/18/2019 1612   RBC 4.69 04/18/2019 1612   HGB 14.6 04/18/2019 1612   HGB 12.6 (L) 03/20/2014 0514   HCT 44.8 04/18/2019 1612   HCT 38.4 (L) 03/20/2014 0514   PLT 406 (H) 04/18/2019 1612   PLT 346 03/20/2014 0514   MCV 95.5 04/18/2019 1612   MCV 89 03/20/2014 0514   MCH 31.1 04/18/2019 1612   MCHC 32.6 04/18/2019 1612   RDW 12.8 04/18/2019 1612   RDW 12.5 03/20/2014 0514   LYMPHSABS 3.2 03/20/2014 0514   MONOABS 1.0 03/20/2014 0514   EOSABS 0.4 03/20/2014 0514   BASOSABS 0.1 03/20/2014 0514    Lithium Lvl  Date Value Ref Range Status  03/13/2019 0.8 0.6 - 1.2 mmol/L Final    Comment:  Detection Limit = 0.1                           <0.1 indicates None Detected     Last lithium level Sept 0.8.   Last lithium level July 27, 2018 was normal at 1.0.   Lithium level [facility] October 03, 2018 = 1.2. Said he got lithium level at [facility] as requested.  Labs not in Epic.  Recent lipids ok except higher TG than usual.  Normal A1C.  No results found for: PHENYTOIN, PHENOBARB, VALPROATE, CBMZ   .res Assessment: Plan:    Albert [name] was seen today for follow-up, sleeping problem and anxiety.  Diagnoses and all orders for this visit:  Severe bipolar I disorder, current or most recent episode depressed (HCC)  Generalized anxiety disorder  Panic disorder with agoraphobia  Forgetfulness  Insomnia due to mental condition  Lithium use    Chronic TR bipolar mixed and chronic anxiety.  He usually has mixed bipolar symptoms which we have not been able to completely eliminate.   Not much subjective change since here.  He is having less hyperactive or manic symptoms.  See long  list of meds tried.   He is somewhat chronically unstable and recently has been worse because of problems related to benzodiazepines specifically Xanax from which she was having side effects those have resolved.[name]  He is cognition does seem better with the switch from alprazolam to lorazepam, per the patient and his mother but is gradually getting worse again..   We discussed the short-term risks associated with benzodiazepines including sedation and increased fall risk among others.  Discussed long-term side effect risk including dependence, potential withdrawal symptoms, and the potential eventual dose-related risk of dementia. Disc newer studies refute dementia risks.  Unfortunately due to the severity of set his symptoms polypharmacy is a necessity.  Answered his questions about any new psychiatric meds.  We discussed Caplyta but it is not FDA approved for bipolar disorder at this time.  But we will consider this.  His sleep is better at the moment but that may be because he is more depressed.  Per his request OK to continue olanzapine to 10 mg HS Disc unusual combo antipsychotics but it helps more than other options.  Discussed potential metabolic side effects associated with atypical antipsychotics, as well as potential risk for movement side effects. Advised pt to contact office if movement side effects occur.  He had some falling around the time when he previously took olanzapine but he took it for several months at this dosage before he had any side effect issues and he was on Xanax at the time that the following occurred. Take LED Seroquel to sleep.   No obvious alternatives for sleep  Discussed safety plan at length with patient.  Advised patient to contact office with any worsening signs and symptoms.  Instructed patient to go to the [facility] emergency room for evaluation if experiencing any acute safety concerns, to include suicidal intent. "I don't want to do it" .  Call if there is  any worsening.   Lithium being used bc chronic SI and death thoughts.   Counseled patient regarding potential benefits, risks, and side effects of lithium to include potential risk of lithium affecting thyroid and renal function.  Discussed need for periodic lab monitoring to determine drug level and to assess for potential adverse effects.  Counseled patient regarding signs and symptoms of lithium toxicity and advised that  they notify office immediately or seek urgent medical attention if experiencing these signs and symptoms.  Patient advised to contact office with any questions or concerns. Continue lithium to 2 of the 300 mg capsules. Lithium level 0.8.  On 750 mg daily.   Call if death thoughts worsen or worsening SI. Prefer only 1 med change at a time. If nothing worse will decrease lithium again DT kidney concerns  Continue Increase fluoxetine 60 mg daily (about early Jan 2021)   to use the combo with olanzapine for TR bipolar depression.  Discussed side effect risk including suicidal worsening and if that happens contact us immediately.  Overall objectively looks better.  Asks about TMS.    Try to reduce quetiapine to 400-600 mg HS instead of 800 mg for sleep.   Option reduce lorazepam again due to memory problems.  Triggered withdrawal in the past bc reduced too much.Not ideal to be taking so much antipsychotic.   Continue lorazepam to 1 tablet in the morning, 1/2 tablet midday, and 1 tablet in the evening.  We discussed the short-term risks associated with benzodiazepines including sedation and increased fall risk among others.  Discussed long-term side effect risk including dependence, potential withdrawal symptoms, and the potential eventual dose-related risk of dementia.  But recent studies from 2020 dispute this association between benzodiazepines and dementia risk. Newer studies in 2020 do not support an association with dementia.  Quetiapine 600 mg at night for sleep.  The  olanzapine should help sleep enough that you don't need as much quetiapine.   Asks for prn meclizine for dizziness.  History vertigo and motion sickness.    This appt was 30 mins.  FU 7-8 weeks   Meredith Staggers, MD, DFAPA  Future Appointments  Date Time Provider Department Center  12/24/2019  9:00 AM [facility] None  12/31/2019  9:45 AM Sondra Come, MD [facility] None    No orders of the defined types were placed in this encounter.     -------------------------------

## 2019-11-07 ENCOUNTER — Encounter: Payer: Self-pay | Admitting: Urology

## 2019-12-05 ENCOUNTER — Ambulatory Visit: Payer: Medicare Other | Admitting: Urology

## 2019-12-17 ENCOUNTER — Other Ambulatory Visit: Payer: Self-pay | Admitting: Urology

## 2019-12-17 ENCOUNTER — Other Ambulatory Visit: Payer: Self-pay | Admitting: Psychiatry

## 2019-12-17 DIAGNOSIS — F411 Generalized anxiety disorder: Secondary | ICD-10-CM

## 2019-12-17 DIAGNOSIS — F4001 Agoraphobia with panic disorder: Secondary | ICD-10-CM

## 2019-12-17 DIAGNOSIS — F314 Bipolar disorder, current episode depressed, severe, without psychotic features: Secondary | ICD-10-CM

## 2019-12-17 DIAGNOSIS — N401 Enlarged prostate with lower urinary tract symptoms: Secondary | ICD-10-CM

## 2019-12-18 NOTE — Telephone Encounter (Signed)
Apt 10/11 

## 2019-12-20 ENCOUNTER — Ambulatory Visit: Payer: Medicare PPO | Admitting: Psychiatry

## 2019-12-24 ENCOUNTER — Other Ambulatory Visit: Payer: Medicare PPO

## 2019-12-24 ENCOUNTER — Other Ambulatory Visit: Payer: Self-pay

## 2019-12-24 DIAGNOSIS — N401 Enlarged prostate with lower urinary tract symptoms: Secondary | ICD-10-CM

## 2019-12-24 DIAGNOSIS — R3914 Feeling of incomplete bladder emptying: Secondary | ICD-10-CM

## 2019-12-25 LAB — PSA: Prostate Specific Ag, Serum: 0.5 ng/mL (ref 0.0–4.0)

## 2019-12-26 ENCOUNTER — Ambulatory Visit (INDEPENDENT_AMBULATORY_CARE_PROVIDER_SITE_OTHER): Payer: Medicare PPO | Admitting: Urology

## 2019-12-26 ENCOUNTER — Encounter: Payer: Self-pay | Admitting: Urology

## 2019-12-26 ENCOUNTER — Other Ambulatory Visit: Payer: Self-pay

## 2019-12-26 VITALS — BP 129/75 | HR 97 | Ht 70.0 in | Wt 196.0 lb

## 2019-12-26 DIAGNOSIS — N529 Male erectile dysfunction, unspecified: Secondary | ICD-10-CM

## 2019-12-26 DIAGNOSIS — R3914 Feeling of incomplete bladder emptying: Secondary | ICD-10-CM | POA: Diagnosis not present

## 2019-12-26 DIAGNOSIS — N401 Enlarged prostate with lower urinary tract symptoms: Secondary | ICD-10-CM

## 2019-12-26 DIAGNOSIS — Z125 Encounter for screening for malignant neoplasm of prostate: Secondary | ICD-10-CM

## 2019-12-26 LAB — BLADDER SCAN AMB NON-IMAGING: Scan Result: 23

## 2019-12-26 MED ORDER — TAMSULOSIN HCL 0.4 MG PO CAPS: 0.4000 mg | ORAL_CAPSULE | Freq: Every day | ORAL | 1 refills | Status: DC

## 2019-12-26 MED ORDER — SILDENAFIL CITRATE 20 MG PO TABS: 100.0000 mg | ORAL_TABLET | ORAL | 11 refills | Status: DC | PRN

## 2019-12-26 NOTE — Progress Notes (Signed)
   12/26/2019 1:43 PM   [name] March 27, 1966 [id number]  Reason for visit: Follow up BPH, ED, history of microscopic hematuria  HPI: I saw Mr. Gest back in urology clinic for follow-up of the above issues.  He has a history of multiple negative microscopic hematuria work-ups in the past, with most recent 2012 with Dr. Achilles Dunk and refused further repeat evaluations for his persistent microscopic hematuria.  He is on 0.8 mg of Flomax nightly which he feels greatly improved his urinary symptoms.  PVR is normal at 23 mL today.  He has nocturia 1-2 times at night.  He also has erectile dysfunction well controlled on 100 mg Revatio on demand.  PSA has been stable, and is 0.5 this year from 0.7 last year.  He denies any gross hematuria, flank pain, gross hematuria, UTIs, or history of urinary retention.  I personally reviewed his renal ultrasound from 06/25/2019 which shows no hydronephrosis or urolithiasis.  Prostate is not clearly identified to measure.  Overall he continues to do well.  We discussed return precautions at length.  He would like to continue Flomax and sildenafil, these medications were refilled.  Continue Flomax daily and sildenafil as needed RTC 1 year with PSA prior for ongoing PSA screening  Sondra Come, MD  [facility] [address] [place], [place] [address] [phone]

## 2019-12-31 ENCOUNTER — Ambulatory Visit: Payer: Medicare Other | Admitting: Urology

## 2020-02-11 ENCOUNTER — Other Ambulatory Visit: Payer: Self-pay

## 2020-02-11 ENCOUNTER — Encounter: Payer: Self-pay | Admitting: Psychiatry

## 2020-02-11 ENCOUNTER — Ambulatory Visit (INDEPENDENT_AMBULATORY_CARE_PROVIDER_SITE_OTHER): Payer: Medicare PPO | Admitting: Psychiatry

## 2020-02-11 DIAGNOSIS — F5105 Insomnia due to other mental disorder: Secondary | ICD-10-CM

## 2020-02-11 DIAGNOSIS — R6889 Other general symptoms and signs: Secondary | ICD-10-CM

## 2020-02-11 DIAGNOSIS — T753XXD Motion sickness, subsequent encounter: Secondary | ICD-10-CM

## 2020-02-11 DIAGNOSIS — Z79899 Other long term (current) drug therapy: Secondary | ICD-10-CM

## 2020-02-11 DIAGNOSIS — F4001 Agoraphobia with panic disorder: Secondary | ICD-10-CM | POA: Diagnosis not present

## 2020-02-11 DIAGNOSIS — F411 Generalized anxiety disorder: Secondary | ICD-10-CM | POA: Diagnosis not present

## 2020-02-11 DIAGNOSIS — F314 Bipolar disorder, current episode depressed, severe, without psychotic features: Secondary | ICD-10-CM | POA: Diagnosis not present

## 2020-02-11 MED ORDER — FLUOXETINE HCL 20 MG PO CAPS: 80.0000 mg | ORAL_CAPSULE | Freq: Every day | ORAL | 0 refills | Status: DC

## 2020-02-11 MED ORDER — QUETIAPINE FUMARATE 300 MG PO TABS: 600.0000 mg | ORAL_TABLET | Freq: Every day | ORAL | 0 refills | Status: DC

## 2020-02-11 MED ORDER — OLANZAPINE 10 MG PO TABS: 10.0000 mg | ORAL_TABLET | Freq: Every day | ORAL | 0 refills | Status: DC

## 2020-02-11 NOTE — Progress Notes (Signed)
FILOMENO CROMLEY [id number] Sep 02, 1965 54 y.o.    Subjective:   Patient ID:  SHULEM MADER is a 54 y.o. (DOB 1965-10-05) male.  Chief Complaint:  Chief Complaint  Patient presents with  . Follow-up    Medication Management  . Anxiety    Medication Management  . Other    Bipolar 1    Depression        Associated symptoms include decreased concentration.  Associated symptoms include no headaches and no suicidal ideas.  Past medical history includes anxiety.   Anxiety Symptoms include decreased concentration and nervous/anxious behavior. Patient reports no confusion, dizziness, palpitations or suicidal ideas.      [name] presents to the office today for follow-up of mixed bipolar, anxiety and still grieving stress of breakup.  He requires frequent follow-up because of long-term symptoms.  At visit October 25, 2018.  We made several medicine changes because of his concerns about sleep and other issues.  We increased olanzapine back to 7.5 mg bc not sleeping as well at 5 mg.  He has to have the prescription for quetiapine written for up to 3 tablets at night in case insomnia is worse because insomnia makes his mood disorder so much worse.  We discussed that was above the usual max but the request was granted given his treatment resistant status. We also reduce lithium from 900 mg daily to 750 mg daily to try to reduce tremor and muscle twitches.  At  visit March 28, 2019.  Fluoxetine was increased to 40 mg daily.   Early January 2021 increased to 60 mg daily.  No SE.  seen June 28, 2019.  No further meds were changed except olanzapine was increased back to 10 mg daily to see if if he could get additional benefit per his request.  Covid vaccinated.  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  Then had cholecystectomy.  April 2021 appt with the following noted: 2 episodes night sweats lately.  Memory has been very bad lately.  Repeatedly asks mother questions. Still  tend to stay in his room and  his bed.  Still has rapid cycling mood swings.  Maybe some better with increase in olanzapine to 10 and tolerating itl.  Would like to get out more but can't DT Covid.  Can perform necessary chores.  Will get out of the house when he can.  No change in death thoughts and anxiety in intensity but is better with frequency.  Usually comes and goes in waves but more persistent.  Consistent with meds.  Ativan not helping anxiety very dramatically but he's not sure.  Fidgety.  No trigger other than still grieving relationship and can't get it out of his head.  Poor energy, concentration and more forgetful. In bed more and less active.  Sleep ok lately which is unusual.  Can concentrate on financial matters and stock market at times.  Tolerating meds.  Still some intrusive SI without reason. Less frequent obsessive thoughts about broken relationship and has been doing this for months.  Can't let it go.  Loop.   No unusual stress even with the family who is supportive. Likes the benefit that Zyprexa gives. No sleepwalking nor falling nor odd behavior.  No history of sleep walking.  He understands this may recur with an increase.  Also wants option to rarely take extra quetiapine 300 for sleep prn. Plan:  Try to reduce lorazepam if possible.  10/22/2019 appointment with the following noted: Continues fluoxetine  60, lithium 600 mg, lorazepam 2 mg AM and HS and 1 mg midday, olanzapine 10, quetiapine 600 mg HS. Still anxious chronically and including driving in crowded spaces.  Doesn't thing Ativan helps as well as Xanax but less cognitive problems. Sleep is not as good.  Occ EFA.  More EMA and wanting to do things in the middle of the night but this is not typical.  Wonders about why that happens.  Some napping.   Tolerating meds.  Asks about weight loss meds.  Disc this in detail.   Plan no changes except OK meclizine prn vertigo.  02/11/20 appt with the following noted: Able to gradually reduce lorazepam to 1  mg AM and HS. More depressed over time and less interested in things and less interest in going out but does with his parents. Has reduced from 800 to 600 mg HS with some awakening but usually able to go back to sleep.[name].  Spending a good amount of time in bed bc watches TV in bedroom.  Parents watch TV in different part of the house.  No mood swings he notices.   Concerns about weight gain about 200#. Likes olanzapine's benefit for sleep.  Still some memory issues.  sleep 5-6 hours nightly.  Trying to stay out of bed when watching TV.  Some awakening but managing. Occ fleeting SI and distracts himself.   Always spends a lot of time just laying around.   Can have anxiety for no reason like coming here, and varies in intensity without pattern.  Overall anxiety seems a little worse.    Less panic than in the past.  Some chronic depression and hyperactivity and loudness and hyperverbal.  Usually back to sleep.  Total 6 hours but some napping.  Still lacks interest and motivation.  Can follow a TV show if interested.  Psych med hx extensive including ECT and  risperidone, lithium 1200 SE, Zyprexa 20-15 akathisia, Latuda 80 which caused akathisia, Abilify, Vraylar, Rexulti, aripiprazole 20 mg with akathisia,Seroquel 1000 mg,  InVega, Geodon, Saphris with side effects, symbyax, Fanapt NR . lamotrigine 300 mg, Depakote 2000 mg, Tegretol,Trileptal and several of these in combinations, gabapentin, N-acetylcysteine, Nuedexta,  clonidine,  Belsomra with no response,  Lunesta no response, trazodone 200 mg, Xanax, clonazepam, lorazepam less sedation. Viibryd 40 mg for 3 months with diarrhea, protriptyline with side effects, Trintellix 20 mg, Parnate 50 mg with no response,  imipramine, venlafaxine,  Emsam 12 mg for 2 months,  bupropion was side effects,  Lexapro 20 mg, sertraline, paroxetine, Deplin,  ethylphenidate 60 mg,  Strattera, , modafinil, pramipexole, Vyvanse, Concerta, amantadine ,Patient prone to  akathisia.   Review of Systems:  Review of Systems  Cardiovascular: Negative for palpitations.  Musculoskeletal: Positive for back pain.  Neurological: Positive for tremors. Negative for dizziness, weakness and headaches.       Fidgety  Psychiatric/Behavioral: Positive for decreased concentration and depression. Negative for agitation, behavioral problems, confusion, dysphoric mood, hallucinations, self-injury, sleep disturbance and suicidal ideas. The patient is nervous/anxious and is hyperactive.     Medications: I have reviewed the patient's current medications.  Current Outpatient Medications  Medication Sig Dispense Refill  . esomeprazole (NEXIUM) 20 MG capsule Take by mouth.    . fenofibrate (TRICOR) 145 MG tablet Take by mouth.    [name]. FLUoxetine (PROZAC) 20 MG capsule Take 4 capsules (80 mg total) by mouth daily. 360 capsule 0  . fluticasone (FLONASE) 50 MCG/ACT nasal spray     . levothyroxine (SYNTHROID, LEVOTHROID)  75 MCG tablet Take by mouth.    [name] lisinopril (ZESTRIL) 5 MG tablet Take 5 mg by mouth daily.    [name] lithium carbonate 300 MG capsule TAKE 2 CAPSULES BY MOUTH EVERY DAY 180 capsule 1  . LORazepam (ATIVAN) 2 MG tablet TAKE 1 TABLET EVERY MORNING 1/2 TABLET AT NOON 1 TABLET AT 6PM AND 1/2 TABLET AT BEDTIME (Patient taking differently: Reduced to 1 TABLET EVERY MORNING and 1 at night) 90 tablet 3  . meclizine (ANTIVERT) 25 MG tablet Take 1 tablet (25 mg total) by mouth 3 (three) times daily as needed for dizziness. 30 tablet 0  . OLANZapine (ZYPREXA) 10 MG tablet Take 1 tablet (10 mg total) by mouth at bedtime. 90 tablet 0  . ondansetron (ZOFRAN ODT) 4 MG disintegrating tablet Take 1 tablet (4 mg total) by mouth every 8 (eight) hours as needed for nausea or vomiting. 20 tablet 0  . QUEtiapine (SEROQUEL) 300 MG tablet Take 2 tablets (600 mg total) by mouth at bedtime. 180 tablet 0  . sildenafil (REVATIO) 20 MG tablet Take 5 tablets (100 mg total) by mouth as needed. 30 tablet  11  . tamsulosin (FLOMAX) 0.4 MG CAPS capsule Take 1 capsule (0.4 mg total) by mouth daily. 90 capsule 1   No current facility-administered medications for this visit.   Medication Side Effects: Other: mild sleepiness.  Occ twitches.  Allergies:  Allergies  Allergen Reactions  . Meloxicam Other (See Comments)    Other reaction(s): Dizziness  . Prednisone   . Wellbutrin [Bupropion]     Past Medical History:  Diagnosis Date  . Anxiety   . Bipolar disorder (HCC)   . Depression   . GERD (gastroesophageal reflux disease)     Family History  Problem Relation Age of Onset  . Prostate cancer Neg Hx   . Bladder Cancer Neg Hx   . Kidney cancer Neg Hx     Social History   Socioeconomic History  . Marital status: Single    Spouse name: Not on file  . Number of children: Not on file  . Years of education: Not on file  . Highest education level: Not on file  Occupational History  . Not on file  Tobacco Use  . Smoking status: Former Games developer  . Smokeless tobacco: Never Used  Vaping Use  . Vaping Use: Never used  Substance and Sexual Activity  . Alcohol use: Not Currently  . Drug use: Never  . Sexual activity: Not on file  Other Topics Concern  . Not on file  Social History Narrative  . Not on file   Social Determinants of Health   Financial Resource Strain:   . Difficulty of Paying Living Expenses: Not on file  Food Insecurity:   . Worried About Programme researcher, broadcasting/film/video in the Last Year: Not on file  . Ran Out of Food in the Last Year: Not on file  Transportation Needs:   . Lack of Transportation (Medical): Not on file  . Lack of Transportation (Non-Medical): Not on file  Physical Activity:   . Days of Exercise per Week: Not on file  . Minutes of Exercise per Session: Not on file  Stress:   . Feeling of Stress : Not on file  Social Connections:   . Frequency of Communication with Friends and Family: Not on file  . Frequency of Social Gatherings with Friends and  Family: Not on file  . Attends Religious Services: Not on file  . Active  Member of Clubs or Organizations: Not on file  . Attends Banker Meetings: Not on file  . Marital Status: Not on file  Intimate Partner Violence:   . Fear of Current or Ex-Partner: Not on file  . Emotionally Abused: Not on file  . Physically Abused: Not on file  . Sexually Abused: Not on file    Past Medical History, Surgical history, Social history, and Family history were reviewed and updated as appropriate.   Please see review of systems for further details on the patient's review from today.   Objective:   Physical Exam:  There were no vitals taken for this visit.  Physical Exam Constitutional:      General: He is not in acute distress.    Appearance: He is well-developed.  Musculoskeletal:        General: No deformity.  Neurological:     Mental Status: He is alert and oriented to person, place, and time.     Cranial Nerves: No dysarthria.     Motor: Tremor present.     Coordination: Coordination normal.     Comments: Slight tremor  Psychiatric:        Attention and Perception: Attention and perception normal. He does not perceive auditory or visual hallucinations.        Mood and Affect: Mood is anxious and depressed. Affect is not labile, blunt, angry, tearful or inappropriate.        Speech: Speech normal. Speech is not slurred.        Behavior: Behavior normal. Behavior is not slowed. Behavior is cooperative.        Thought Content: Thought content is not paranoid or delusional. Thought content includes suicidal ideation. Thought content does not include homicidal ideation. Thought content does not include homicidal or suicidal plan.        Cognition and Memory: Cognition normal. He exhibits impaired recent memory.        Judgment: Judgment normal.     Comments: Insight fair Memory is better than it was but still complains He is chronically anxious  No manic episodes  noted. Depression gradually worse Less fidgety than usual. Some wordfinding problems ongoing.     Lab Review:     Component Value Date/Time   NA 137 04/18/2019 1612   NA 142 03/20/2014 0514   K 3.9 04/18/2019 1612   K 4.1 03/20/2014 0514   CL 106 04/18/2019 1612   CL 112 (H) 03/20/2014 0514   CO2 20 (L) 04/18/2019 1612   CO2 26 03/20/2014 0514   GLUCOSE 148 (H) 04/18/2019 1612   GLUCOSE 99 03/20/2014 0514   BUN 13 04/18/2019 1612   BUN 7 03/20/2014 0514   CREATININE 1.63 (H) 04/18/2019 1612   CREATININE 0.78 03/20/2014 0514   CALCIUM 9.5 04/18/2019 1612   CALCIUM 9.5 03/20/2014 0514   PROT 7.8 04/18/2019 1612   PROT 7.3 03/18/2014 1459   ALBUMIN 4.6 04/18/2019 1612   ALBUMIN 3.2 (L) 03/18/2014 1459   AST 24 04/18/2019 1612   AST 16 03/18/2014 1459   ALT 18 04/18/2019 1612   ALT 16 03/18/2014 1459   ALKPHOS 40 04/18/2019 1612   ALKPHOS 105 03/18/2014 1459   BILITOT 0.7 04/18/2019 1612   BILITOT 0.3 03/18/2014 1459   GFRNONAA 47 (L) 04/18/2019 1612   GFRNONAA >60 03/20/2014 0514   GFRAA 54 (L) 04/18/2019 1612   GFRAA >60 03/20/2014 0514       Component Value Date/Time   WBC 6.4 04/18/2019  1612   RBC 4.69 04/18/2019 1612   HGB 14.6 04/18/2019 1612   HGB 12.6 (L) 03/20/2014 0514   HCT 44.8 04/18/2019 1612   HCT 38.4 (L) 03/20/2014 0514   PLT 406 (H) 04/18/2019 1612   PLT 346 03/20/2014 0514   MCV 95.5 04/18/2019 1612   MCV 89 03/20/2014 0514   MCH 31.1 04/18/2019 1612   MCHC 32.6 04/18/2019 1612   RDW 12.8 04/18/2019 1612   RDW 12.5 03/20/2014 0514   LYMPHSABS 3.2 03/20/2014 0514   MONOABS 1.0 03/20/2014 0514   EOSABS 0.4 03/20/2014 0514   BASOSABS 0.1 03/20/2014 0514    Lithium Lvl  Date Value Ref Range Status  03/13/2019 0.8 0.6 - 1.2 mmol/L Final    Comment:                                     Detection Limit = 0.1                           <0.1 indicates None Detected     Last lithium level Sept 0.8.   Last lithium level July 27, 2018 was  normal at 1.0.   Lithium level [facility] October 03, 2018 = 1.2. Said he got lithium level at [facility] as requested.  Labs not in Epic.  Recent lipids ok except higher TG than usual.  Normal A1C.  No results found for: PHENYTOIN, PHENOBARB, VALPROATE, CBMZ   .res Assessment: Plan:    Cade was seen today for follow-up, anxiety and other.  Diagnoses and all orders for this visit:  Severe bipolar I disorder, current or most recent episode depressed (HCC) -     FLUoxetine (PROZAC) 20 MG capsule; Take 4 capsules (80 mg total) by mouth daily. -     OLANZapine (ZYPREXA) 10 MG tablet; Take 1 tablet (10 mg total) by mouth at bedtime. -     QUEtiapine (SEROQUEL) 300 MG tablet; Take 2 tablets (600 mg total) by mouth at bedtime.  Generalized anxiety disorder -     FLUoxetine (PROZAC) 20 MG capsule; Take 4 capsules (80 mg total) by mouth daily.  Panic disorder with agoraphobia -     FLUoxetine (PROZAC) 20 MG capsule; Take 4 capsules (80 mg total) by mouth daily.  Insomnia due to mental condition  Forgetfulness  Motion sickness, subsequent encounter  Lithium use    Chronic TR bipolar mixed and chronic anxiety.  He usually has mixed bipolar symptoms which we have not been able to completely eliminate.   Not much subjective change since here.  He is having less hyperactive or manic symptoms.  See long list of meds tried.   He is somewhat chronically unstable and recently has been worse because of problems related to benzodiazepines specifically Xanax from which she was having side effects those have resolved.[name]  He is cognition does seem better with the switch from alprazolam to lorazepam, per the patient and his mother but is gradually getting worse again..   We discussed the short-term risks associated with benzodiazepines including sedation and increased fall risk among others.  Discussed long-term side effect risk including dependence, potential withdrawal symptoms, and the potential eventual  dose-related risk of dementia. Disc newer studies refute dementia risks.  Unfortunately due to the severity of set his symptoms polypharmacy is a necessity.  Answered his questions about any new psychiatric meds.  We  discussed Caplyta but it is not FDA approved for bipolar disorder at this time.  But we will consider this. He would have to stop olanzapine and he prefers not to do this.  His sleep is better at the moment but that may be because he is more depressed.  Per his request OK to continue olanzapine to 10 mg HS Disc unusual combo antipsychotics but it helps more than other options.  Discussed potential metabolic side effects associated with atypical antipsychotics, as well as potential risk for movement side effects. Advised pt to contact office if movement side effects occur.  He had some falling around the time when he previously took olanzapine but he took it for several months at this dosage before he had any side effect issues and he was on Xanax at the time that the following occurred. Take LED Seroquel to sleep.   No obvious alternatives for sleep  Discussed safety plan at length with patient.  Advised patient to contact office with any worsening signs and symptoms.  Instructed patient to go to the [facility] emergency room for evaluation if experiencing any acute safety concerns, to include suicidal intent. "I don't want to do it" .  Call if there is any worsening.   Lithium being used bc chronic SI and death thoughts.   Counseled patient regarding potential benefits, risks, and side effects of lithium to include potential risk of lithium affecting thyroid and renal function.  Discussed need for periodic lab monitoring to determine drug level and to assess for potential adverse effects.  Counseled patient regarding signs and symptoms of lithium toxicity and advised that they notify office immediately or seek urgent medical attention if experiencing these signs and symptoms.  Patient  advised to contact office with any questions or concerns. Continue lithium to 2 of the 300 mg capsules. Lithium level 0.8.  On 750 mg daily.   Call if death thoughts worsen or worsening SI. Prefer only 1 med change at a time. If nothing worse will decrease lithium again DT kidney concerns  Trial for TRD to  Increase fluoxetine to 80 mg daily  to use the combo with olanzapine for TR bipolar depression.  Discussed side effect risk including suicidal worsening and if that happens contact us immediately.  Says he's gradually lost benefit and gotten more depressed.  He says he can't reduce quetiapine further bc fear of insomnia.   Option reduce lorazepam again due to memory problems.  Triggered withdrawal in the past bc reduced too much.Not ideal to be taking so much antipsychotic.     Continue lorazepam to 1 tablet in the morning,  and 1 tablet in the evening.  We discussed the short-term risks associated with benzodiazepines including sedation and increased fall risk among others.  Discussed long-term side effect risk including dependence, potential withdrawal symptoms, and the potential eventual dose-related risk of dementia.  But recent studies from 2020 dispute this association between benzodiazepines and dementia risk. Newer studies in 2020 do not support an association with dementia.  Had reduced Quetiapine 600 mg at night for sleep.  The olanzapine should help sleep enough that you don't need as much quetiapine.   Asks for prn meclizine for dizziness.  History vertigo and motion sickness.    Answered questions about Ozempic for weight loss.  This appt was 30 mins.  FU 8 weeks   Meredith Staggers, MD, DFAPA  Future Appointments  Date Time Provider Department Center  12/25/2020  1:30 PM Sondra Come, MD  [facility] None    No orders of the defined types were placed in this encounter.     -------------------------------

## 2020-04-21 ENCOUNTER — Other Ambulatory Visit: Payer: Self-pay

## 2020-04-21 ENCOUNTER — Encounter: Payer: Self-pay | Admitting: Psychiatry

## 2020-04-21 ENCOUNTER — Ambulatory Visit (INDEPENDENT_AMBULATORY_CARE_PROVIDER_SITE_OTHER): Payer: Medicare PPO | Admitting: Psychiatry

## 2020-04-21 DIAGNOSIS — F4001 Agoraphobia with panic disorder: Secondary | ICD-10-CM | POA: Diagnosis not present

## 2020-04-21 DIAGNOSIS — F411 Generalized anxiety disorder: Secondary | ICD-10-CM | POA: Diagnosis not present

## 2020-04-21 DIAGNOSIS — F5105 Insomnia due to other mental disorder: Secondary | ICD-10-CM | POA: Diagnosis not present

## 2020-04-21 DIAGNOSIS — F314 Bipolar disorder, current episode depressed, severe, without psychotic features: Secondary | ICD-10-CM

## 2020-04-21 DIAGNOSIS — T753XXD Motion sickness, subsequent encounter: Secondary | ICD-10-CM

## 2020-04-21 DIAGNOSIS — R6889 Other general symptoms and signs: Secondary | ICD-10-CM

## 2020-04-21 MED ORDER — LITHIUM CARBONATE 300 MG PO CAPS: 600.0000 mg | ORAL_CAPSULE | Freq: Every day | ORAL | 1 refills | Status: DC

## 2020-04-21 MED ORDER — QUETIAPINE FUMARATE 300 MG PO TABS: 600.0000 mg | ORAL_TABLET | Freq: Every day | ORAL | 0 refills | Status: DC

## 2020-04-21 MED ORDER — LORAZEPAM 2 MG PO TABS: 2.0000 mg | ORAL_TABLET | Freq: Two times a day (BID) | ORAL | 3 refills | Status: DC

## 2020-04-21 MED ORDER — FLUOXETINE HCL 20 MG PO CAPS: 80.0000 mg | ORAL_CAPSULE | Freq: Every day | ORAL | 0 refills | Status: DC

## 2020-04-21 MED ORDER — OLANZAPINE 10 MG PO TABS: 10.0000 mg | ORAL_TABLET | Freq: Every day | ORAL | 0 refills | Status: DC

## 2020-04-21 NOTE — Patient Instructions (Signed)
Caplyta

## 2020-04-21 NOTE — Progress Notes (Signed)
EMERY DUPUY [id number] Nov 15, 1965 54 y.o.    Subjective:   Patient ID:  Albert [name] is a 54 y.o. (DOB 10/09/1965) male.  Chief Complaint:  Chief Complaint  Patient presents with  . Follow-up  . Depression  . Sleeping Problem  . Anxiety    Depression        Associated symptoms include decreased concentration.  Associated symptoms include no headaches and no suicidal ideas.  Past medical history includes anxiety.   Anxiety Symptoms include decreased concentration and nervous/anxious behavior. Patient reports no confusion, dizziness, palpitations or suicidal ideas.      Albert [name] presents to the office today for follow-up of mixed bipolar, anxiety and still grieving stress of breakup.  He requires frequent follow-up because of long-term symptoms.  At visit October 25, 2018.  We made several medicine changes because of his concerns about sleep and other issues.  We increased olanzapine back to 7.5 mg bc not sleeping as well at 5 mg.  He has to have the prescription for quetiapine written for up to 3 tablets at night in case insomnia is worse because insomnia makes his mood disorder so much worse.  We discussed that was above the usual max but the request was granted given his treatment resistant status. We also reduce lithium from 900 mg daily to 750 mg daily to try to reduce tremor and muscle twitches.  At  visit March 28, 2019.  Fluoxetine was increased to 40 mg daily.   Early January 2021 increased to 60 mg daily.  No SE.  seen June 28, 2019.  No further meds were changed except olanzapine was increased back to 10 mg daily to see if if he could get additional benefit per his request.  Covid vaccinated.  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  Then had cholecystectomy.  April 2021 appt with the following noted: 2 episodes night sweats lately.  Memory has been very bad lately.  Repeatedly asks mother questions. Still  tend to stay in his room and his bed.  Still has rapid cycling mood  swings.  Maybe some better with increase in olanzapine to 10 and tolerating itl.  Would like to get out more but can't DT Covid.  Can perform necessary chores.  Will get out of the house when he can.  No change in death thoughts and anxiety in intensity but is better with frequency.  Usually comes and goes in waves but more persistent.  Consistent with meds.  Ativan not helping anxiety very dramatically but he's not sure.  Fidgety.  No trigger other than still grieving relationship and can't get it out of his head.  Poor energy, concentration and more forgetful. In bed more and less active.  Sleep ok lately which is unusual.  Can concentrate on financial matters and stock market at times.  Tolerating meds.  Still some intrusive SI without reason. Less frequent obsessive thoughts about broken relationship and has been doing this for months.  Can't let it go.  Loop.   No unusual stress even with the family who is supportive. Likes the benefit that Zyprexa gives. No sleepwalking nor falling nor odd behavior.  No history of sleep walking.  He understands this may recur with an increase.  Also wants option to rarely take extra quetiapine 300 for sleep prn. Plan:  Try to reduce lorazepam if possible.  10/22/2019 appointment with the following noted: Continues fluoxetine 60, lithium 600 mg, lorazepam 2 mg AM and HS and  1 mg midday, olanzapine 10, quetiapine 600 mg HS. Still anxious chronically and including driving in crowded spaces.  Doesn't thing Ativan helps as well as Xanax but less cognitive problems. Sleep is not as good.  Occ EFA.  More EMA and wanting to do things in the middle of the night but this is not typical.  Wonders about why that happens.  Some napping.   Tolerating meds.  Asks about weight loss meds.  Disc this in detail.   Plan no changes except OK meclizine prn vertigo.  02/11/20 appt with the following noted: Able to gradually reduce lorazepam to 1 mg AM and HS. More depressed over time  and less interested in things and less interest in going out but does with his parents. Has reduced from 800 to 600 mg HS with some awakening but usually able to go back to sleep.[name]  Spending a good amount of time in bed bc watches TV in bedroom.  Parents watch TV in different part of the house.  No mood swings he notices.   Concerns about weight gain about 200#. Likes olanzapine's benefit for sleep. Plan: Trial for TRD to  Increase fluoxetine to 80 mg daily  to use the combo with olanzapine for TR bipolar depression.   04/21/20 appt with following noted: I thought in beginning some benefit with fluoxetine.  Lifelong negative thinking continues.   Not much bipolar.  Reads a lot on bipolar.   Still tired and anxious a little more may be seasonal.  No familial stressors.  Tends to lay down in afternoon.  Anxiety not over anything in particular.   No SE with fluoxetine. Took meclizine prn.  Easily motion sick.    Asks questions about newer drugs for bipolar like Caplyta.  Still some memory issues.  sleep 5-6 hours nightly.  Trying to stay out of bed when watching TV.  Some awakening but managing. Occ fleeting SI and distracts himself.   Always spends a lot of time just laying around.   Can have anxiety for no reason like coming here, and varies in intensity without pattern.      Less panic than in the past.  Some chronic depression and hyperactivity and loudness and hyperverbal.  Usually back to sleep.  Total 6 hours but some napping, awakens 2-4 times nightly but back to sleep..  Taking quetiapine 600 mg HS.  still lacks interest and motivation.  Can follow a TV show if interested.  Psych med hx extensive including ECT and  risperidone, lithium 1200 SE, Zyprexa 20-15 akathisia, Latuda 80 which caused akathisia, Abilify, Vraylar, Rexulti, aripiprazole 20 mg with akathisia, Seroquel 1000 mg,  InVega, Geodon, Saphris with side effects, symbyax, Fanapt NR . lamotrigine 300 mg, Depakote 2000 mg,  Tegretol,Trileptal and several of these in combinations, gabapentin, N-acetylcysteine, Nuedexta,  clonidine,  Belsomra with no response,  Lunesta no response, trazodone 200 mg, Xanax, clonazepam, lorazepam less sedation. Viibryd 40 mg for 3 months with diarrhea, protriptyline with side effects, Trintellix 20 mg, Parnate 50 mg with no response,  imipramine, venlafaxine,  Emsam 12 mg for 2 months,  bupropion was side effects,  Lexapro 20 mg, sertraline, paroxetine, Deplin, fluoxetine 80 methylphenidate 60 mg,  Strattera, , modafinil, pramipexole, Vyvanse, Concerta, amantadine ,Patient prone to akathisia.   Review of Systems:  Review of Systems  Cardiovascular: Negative for palpitations.  Musculoskeletal: Positive for back pain.  Neurological: Positive for tremors. Negative for dizziness, weakness and headaches.       Fidgety  Psychiatric/Behavioral: Positive for decreased concentration and depression. Negative for agitation, behavioral problems, confusion, dysphoric mood, hallucinations, self-injury, sleep disturbance and suicidal ideas. The patient is nervous/anxious and is hyperactive.     Medications: I have reviewed the patient's current medications.  Current Outpatient Medications  Medication Sig Dispense Refill  . esomeprazole (NEXIUM) 20 MG capsule Take by mouth.    . fluticasone (FLONASE) 50 MCG/ACT nasal spray     . levothyroxine (SYNTHROID, LEVOTHROID) 75 MCG tablet Take by mouth.    [name] lisinopril (ZESTRIL) 5 MG tablet Take 5 mg by mouth daily.    . meclizine (ANTIVERT) 25 MG tablet Take 1 tablet (25 mg total) by mouth 3 (three) times daily as needed for dizziness. 30 tablet 0  . ondansetron (ZOFRAN ODT) 4 MG disintegrating tablet Take 1 tablet (4 mg total) by mouth every 8 (eight) hours as needed for nausea or vomiting. 20 tablet 0  . sildenafil (REVATIO) 20 MG tablet Take 5 tablets (100 mg total) by mouth as needed. 30 tablet 11  . tamsulosin (FLOMAX) 0.4 MG CAPS capsule Take 1  capsule (0.4 mg total) by mouth daily. 90 capsule 1  . fenofibrate (TRICOR) 145 MG tablet Take by mouth.    [name] FLUoxetine (PROZAC) 20 MG capsule Take 4 capsules (80 mg total) by mouth daily. 360 capsule 0  . lithium carbonate 300 MG capsule Take 2 capsules (600 mg total) by mouth daily. 180 capsule 1  . LORazepam (ATIVAN) 2 MG tablet Take 1 tablet (2 mg total) by mouth in the morning and at bedtime. Reduced to 1 TABLET EVERY MORNING and 1 at night 60 tablet 3  . OLANZapine (ZYPREXA) 10 MG tablet Take 1 tablet (10 mg total) by mouth at bedtime. 90 tablet 0  . QUEtiapine (SEROQUEL) 300 MG tablet Take 2 tablets (600 mg total) by mouth at bedtime. 180 tablet 0   No current facility-administered medications for this visit.   Medication Side Effects: Other: mild sleepiness.  Occ twitches.\, tremor  Allergies:  Allergies  Allergen Reactions  . Meloxicam Other (See Comments)    Other reaction(s): Dizziness  . Prednisone   . Wellbutrin [Bupropion]     Past Medical History:  Diagnosis Date  . Anxiety   . Bipolar disorder (HCC)   . Depression   . GERD (gastroesophageal reflux disease)     Family History  Problem Relation Age of Onset  . Prostate cancer Neg Hx   . Bladder Cancer Neg Hx   . Kidney cancer Neg Hx     Social History   Socioeconomic History  . Marital status: Single    Spouse name: Not on file  . Number of children: Not on file  . Years of education: Not on file  . Highest education level: Not on file  Occupational History  . Not on file  Tobacco Use  . Smoking status: Former Games developer  . Smokeless tobacco: Never Used  Vaping Use  . Vaping Use: Never used  Substance and Sexual Activity  . Alcohol use: Not Currently  . Drug use: Never  . Sexual activity: Not on file  Other Topics Concern  . Not on file  Social History Narrative  . Not on file   Social Determinants of Health   Financial Resource Strain: Not on file  Food Insecurity: Not on file   Transportation Needs: Not on file  Physical Activity: Not on file  Stress: Not on file  Social Connections: Not on file  Intimate Partner Violence:  Not on file    Past Medical History, Surgical history, Social history, and Family history were reviewed and updated as appropriate.   Please see review of systems for further details on the patient's review from today.   Objective:   Physical Exam:  There were no vitals taken for this visit.  Physical Exam Constitutional:      General: He is not in acute distress.    Appearance: He is well-developed.  Musculoskeletal:        General: No deformity.  Neurological:     Mental Status: He is alert and oriented to person, place, and time.     Cranial Nerves: No dysarthria.     Motor: Tremor present.     Coordination: Coordination normal.     Comments: Slight tremor  Psychiatric:        Attention and Perception: Attention and perception normal. He does not perceive auditory or visual hallucinations.        Mood and Affect: Mood is anxious and depressed. Affect is not labile, blunt, angry, tearful or inappropriate.        Speech: Speech normal. Speech is not slurred.        Behavior: Behavior normal. Behavior is not slowed. Behavior is cooperative.        Thought Content: Thought content is not paranoid or delusional. Thought content includes suicidal ideation. Thought content does not include homicidal ideation. Thought content does not include homicidal or suicidal plan.        Cognition and Memory: Cognition normal. He exhibits impaired recent memory.        Judgment: Judgment normal.     Comments: Insight fair Memory is better than it was but still complains He is chronically anxious  No manic episodes noted. Depression maybe a little better Less fidgety than usual. Some wordfinding problems ongoing.     Lab Review:     Component Value Date/Time   NA 137 04/18/2019 1612   NA 142 03/20/2014 0514   K 3.9 04/18/2019 1612   K  4.1 03/20/2014 0514   CL 106 04/18/2019 1612   CL 112 (H) 03/20/2014 0514   CO2 20 (L) 04/18/2019 1612   CO2 26 03/20/2014 0514   GLUCOSE 148 (H) 04/18/2019 1612   GLUCOSE 99 03/20/2014 0514   BUN 13 04/18/2019 1612   BUN 7 03/20/2014 0514   CREATININE 1.63 (H) 04/18/2019 1612   CREATININE 0.78 03/20/2014 0514   CALCIUM 9.5 04/18/2019 1612   CALCIUM 9.5 03/20/2014 0514   PROT 7.8 04/18/2019 1612   PROT 7.3 03/18/2014 1459   ALBUMIN 4.6 04/18/2019 1612   ALBUMIN 3.2 (L) 03/18/2014 1459   AST 24 04/18/2019 1612   AST 16 03/18/2014 1459   ALT 18 04/18/2019 1612   ALT 16 03/18/2014 1459   ALKPHOS 40 04/18/2019 1612   ALKPHOS 105 03/18/2014 1459   BILITOT 0.7 04/18/2019 1612   BILITOT 0.3 03/18/2014 1459   GFRNONAA 47 (L) 04/18/2019 1612   GFRNONAA >60 03/20/2014 0514   GFRAA 54 (L) 04/18/2019 1612   GFRAA >60 03/20/2014 0514       Component Value Date/Time   WBC 6.4 04/18/2019 1612   RBC 4.69 04/18/2019 1612   HGB 14.6 04/18/2019 1612   HGB 12.6 (L) 03/20/2014 0514   HCT 44.8 04/18/2019 1612   HCT 38.4 (L) 03/20/2014 0514   PLT 406 (H) 04/18/2019 1612   PLT 346 03/20/2014 0514   MCV 95.5 04/18/2019 1612   MCV 89  03/20/2014 0514   MCH 31.1 04/18/2019 1612   MCHC 32.6 04/18/2019 1612   RDW 12.8 04/18/2019 1612   RDW 12.5 03/20/2014 0514   LYMPHSABS 3.2 03/20/2014 0514   MONOABS 1.0 03/20/2014 0514   EOSABS 0.4 03/20/2014 0514   BASOSABS 0.1 03/20/2014 0514    Lithium Lvl  Date Value Ref Range Status  03/13/2019 0.8 0.6 - 1.2 mmol/L Final    Comment:                                     Detection Limit = 0.1                           <0.1 indicates None Detected     Last lithium level Sept 0.8.   Last lithium level July 27, 2018 was normal at 1.0.   Lithium level [facility] October 03, 2018 = 1.2. Said he got lithium level at [facility] as requested.  Labs not in Epic.  Recent lipids ok except higher TG than usual.  Normal A1C.  No results found for: PHENYTOIN,  PHENOBARB, VALPROATE, CBMZ   .res Assessment: Plan:    Albert [name] was seen today for follow-up, depression, sleeping problem and anxiety.  Diagnoses and all orders for this visit:  Severe bipolar I disorder, current or most recent episode depressed (HCC) -     OLANZapine (ZYPREXA) 10 MG tablet; Take 1 tablet (10 mg total) by mouth at bedtime. -     QUEtiapine (SEROQUEL) 300 MG tablet; Take 2 tablets (600 mg total) by mouth at bedtime. -     lithium carbonate 300 MG capsule; Take 2 capsules (600 mg total) by mouth daily. -     FLUoxetine (PROZAC) 20 MG capsule; Take 4 capsules (80 mg total) by mouth daily.  Generalized anxiety disorder -     FLUoxetine (PROZAC) 20 MG capsule; Take 4 capsules (80 mg total) by mouth daily. -     LORazepam (ATIVAN) 2 MG tablet; Take 1 tablet (2 mg total) by mouth in the morning and at bedtime. Reduced to 1 TABLET EVERY MORNING and 1 at night  Panic disorder with agoraphobia -     FLUoxetine (PROZAC) 20 MG capsule; Take 4 capsules (80 mg total) by mouth daily. -     LORazepam (ATIVAN) 2 MG tablet; Take 1 tablet (2 mg total) by mouth in the morning and at bedtime. Reduced to 1 TABLET EVERY MORNING and 1 at night  Insomnia due to mental condition  Forgetfulness  Motion sickness, subsequent encounter    Chronic TR bipolar mixed and chronic anxiety.  He usually has mixed bipolar symptoms which we have not been able to completely eliminate.   Not much subjective change since here.  He is having less hyperactive or manic symptoms.  See long list of meds tried.   He is somewhat chronically unstable and recently has been worse because of problems related to benzodiazepines specifically Xanax from which she was having side effects those have resolved.[name]  He is cognition does seem better with the switch from alprazolam to lorazepam, per the patient and his mother but is gradually getting worse again..   We discussed the short-term risks associated with benzodiazepines  including sedation and increased fall risk among others.  Discussed long-term side effect risk including dependence, potential withdrawal symptoms, and the potential eventual dose-related risk of dementia. Disc  newer studies refute dementia risks.  Unfortunately due to the severity of set his symptoms polypharmacy is a necessity.  Answered his questions about any new psychiatric meds.  We discussed Caplyta but it is not FDA approved for bipolar disorder at this time.  But we will consider this. He would have to stop olanzapine and he prefers not to do this.  His sleep is better at the moment but that may be because he is more depressed.  Per his request OK to continue olanzapine to 10 mg HS Disc unusual combo antipsychotics but it helps more than other options.  Discussed potential metabolic side effects associated with atypical antipsychotics, as well as potential risk for movement side effects. Advised pt to contact office if movement side effects occur.  He had some falling around the time when he previously took olanzapine but he took it for several months at this dosage before he had any side effect issues and he was on Xanax at the time that the following occurred. Take LED Seroquel to sleep.   No obvious alternatives for sleep  Discussed safety plan at length with patient.  Advised patient to contact office with any worsening signs and symptoms.  Instructed patient to go to the [facility] emergency room for evaluation if experiencing any acute safety concerns, to include suicidal intent. "I don't want to do it" .  Call if there is any worsening.   Lithium being used bc chronic SI and death thoughts.   Counseled patient regarding potential benefits, risks, and side effects of lithium to include potential risk of lithium affecting thyroid and renal function.  Discussed need for periodic lab monitoring to determine drug level and to assess for potential adverse effects.  Counseled patient  regarding signs and symptoms of lithium toxicity and advised that they notify office immediately or seek urgent medical attention if experiencing these signs and symptoms.  Patient advised to contact office with any questions or concerns. Continue lithium to 2 of the 300 mg capsules. Lithium level 0.8.  On 750 mg daily.   Call if death thoughts worsen or worsening SI. Prefer only 1 med change at a time. If nothing worse will decrease lithium again DT kidney concerns  Trial for TRD to  Increase fluoxetine to 80 mg daily  to use the combo with olanzapine for TR bipolar depression.  Discussed side effect risk including suicidal worsening and if that happens contact us immediately.  Says he's gradually lost benefit and gotten more depressed.  He says he can't reduce quetiapine further bc fear of insomnia.   Option reduce lorazepam again due to memory problems.  Triggered withdrawal in the past bc reduced too much.Not ideal to be taking so much antipsychotic.     Continue lorazepam to 1 tablet in the morning,  and 1 tablet in the evening.  We discussed the short-term risks associated with benzodiazepines including sedation and increased fall risk among others.  Discussed long-term side effect risk including dependence, potential withdrawal symptoms, and the potential eventual dose-related risk of dementia.  But recent studies from 2020 dispute this association between benzodiazepines and dementia risk. Newer studies in 2020 do not support an association with dementia.  Had reduced Quetiapine 600 mg at night for sleep.  The olanzapine should help sleep enough that you don't need as much quetiapine.   Asks for prn meclizine for dizziness.  History vertigo and motion sickness.    No med changes  This appt was 30 mins.  FU  8 weeks   Meredith Staggers, MD, DFAPA  Future Appointments  Date Time Provider Department Center  12/25/2020  1:30 PM Sondra Come, MD [id number] None    No orders of the defined  types were placed in this encounter.     -------------------------------

## 2020-05-16 ENCOUNTER — Other Ambulatory Visit: Payer: Self-pay | Admitting: Psychiatry

## 2020-05-16 DIAGNOSIS — F314 Bipolar disorder, current episode depressed, severe, without psychotic features: Secondary | ICD-10-CM

## 2020-05-16 DIAGNOSIS — F4001 Agoraphobia with panic disorder: Secondary | ICD-10-CM

## 2020-05-16 DIAGNOSIS — F411 Generalized anxiety disorder: Secondary | ICD-10-CM

## 2020-06-06 ENCOUNTER — Other Ambulatory Visit: Payer: Self-pay | Admitting: General Surgery

## 2020-06-06 DIAGNOSIS — K429 Umbilical hernia without obstruction or gangrene: Secondary | ICD-10-CM

## 2020-06-06 DIAGNOSIS — K4020 Bilateral inguinal hernia, without obstruction or gangrene, not specified as recurrent: Secondary | ICD-10-CM

## 2020-06-06 DIAGNOSIS — K219 Gastro-esophageal reflux disease without esophagitis: Secondary | ICD-10-CM

## 2020-06-06 DIAGNOSIS — K449 Diaphragmatic hernia without obstruction or gangrene: Secondary | ICD-10-CM

## 2020-06-27 ENCOUNTER — Ambulatory Visit
Admission: RE | Admit: 2020-06-27 | Discharge: 2020-06-27 | Disposition: A | Discharge status: Home / Self Care | Payer: Medicare PPO | Source: Ambulatory Visit | Attending: General Surgery | Admitting: General Surgery

## 2020-06-27 ENCOUNTER — Other Ambulatory Visit: Payer: Self-pay

## 2020-06-27 DIAGNOSIS — K429 Umbilical hernia without obstruction or gangrene: Secondary | ICD-10-CM | POA: Insufficient documentation

## 2020-06-27 DIAGNOSIS — K4020 Bilateral inguinal hernia, without obstruction or gangrene, not specified as recurrent: Secondary | ICD-10-CM | POA: Diagnosis present

## 2020-06-27 DIAGNOSIS — K449 Diaphragmatic hernia without obstruction or gangrene: Secondary | ICD-10-CM | POA: Insufficient documentation

## 2020-06-27 DIAGNOSIS — K219 Gastro-esophageal reflux disease without esophagitis: Secondary | ICD-10-CM | POA: Diagnosis not present

## 2020-06-27 LAB — POCT I-STAT CREATININE: Creatinine, Ser: 1.6 mg/dL — ABNORMAL HIGH (ref 0.61–1.24)

## 2020-06-27 MED ORDER — IOHEXOL 300 MG/ML  SOLN
100.0000 mL | Freq: Once | INTRAMUSCULAR | Status: AC | PRN
  Administered 2020-06-27: 100 mL via INTRAVENOUS

## 2020-06-30 ENCOUNTER — Encounter: Payer: Self-pay | Admitting: Psychiatry

## 2020-06-30 ENCOUNTER — Ambulatory Visit (INDEPENDENT_AMBULATORY_CARE_PROVIDER_SITE_OTHER): Payer: Medicare PPO | Admitting: Psychiatry

## 2020-06-30 ENCOUNTER — Other Ambulatory Visit: Payer: Self-pay

## 2020-06-30 DIAGNOSIS — R6889 Other general symptoms and signs: Secondary | ICD-10-CM

## 2020-06-30 DIAGNOSIS — F314 Bipolar disorder, current episode depressed, severe, without psychotic features: Secondary | ICD-10-CM | POA: Diagnosis not present

## 2020-06-30 DIAGNOSIS — Z79899 Other long term (current) drug therapy: Secondary | ICD-10-CM

## 2020-06-30 DIAGNOSIS — F5105 Insomnia due to other mental disorder: Secondary | ICD-10-CM

## 2020-06-30 DIAGNOSIS — F4001 Agoraphobia with panic disorder: Secondary | ICD-10-CM

## 2020-06-30 DIAGNOSIS — F411 Generalized anxiety disorder: Secondary | ICD-10-CM | POA: Diagnosis not present

## 2020-06-30 NOTE — Patient Instructions (Signed)
Reduce fluoxetine to 1 daily and reduce olanzapine to one half nightly for 10 days and then stop it. Then start Caplyta 1 daily

## 2020-06-30 NOTE — Progress Notes (Signed)
Albert [name] [id number] Dec 05, 1965 55 y.o.    Subjective:   Patient ID:  Albert [name] is a 55 y.o. (DOB 1966/02/04) male.  Chief Complaint:  Chief Complaint  Patient presents with  . Follow-up  . Severe bipolar I disorder, current or most recent episode d  . Depression    Depression        Associated symptoms include decreased concentration.  Associated symptoms include no headaches and no suicidal ideas.  Past medical history includes anxiety.   Anxiety Symptoms include decreased concentration, nervous/anxious behavior and shortness of breath. Patient reports no confusion, dizziness, palpitations or suicidal ideas.      Albert [name] presents to the office today for follow-up of mixed bipolar, anxiety and still grieving stress of breakup.  He requires frequent follow-up because of long-term symptoms.  At visit October 25, 2018.  We made several medicine changes because of his concerns about sleep and other issues.  We increased olanzapine back to 7.5 mg bc not sleeping as well at 5 mg.  He has to have the prescription for quetiapine written for up to 3 tablets at night in case insomnia is worse because insomnia makes his mood disorder so much worse.  We discussed that was above the usual max but the request was granted given his treatment resistant status. We also reduce lithium from 900 mg daily to 750 mg daily to try to reduce tremor and muscle twitches.  At  visit March 28, 2019.  Fluoxetine was increased to 40 mg daily.   Early January 2021 increased to 60 mg daily.  No SE.  seen June 28, 2019.  No further meds were changed except olanzapine was increased back to 10 mg daily to see if if he could get additional benefit per his request.  Covid vaccinated.  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  Then had cholecystectomy.  April 2021 appt with the following noted: 2 episodes night sweats lately.  Memory has been very bad lately.  Repeatedly asks mother questions. Still  tend to stay in  his room and his bed.  Still has rapid cycling mood swings.  Maybe some better with increase in olanzapine to 10 and tolerating itl.  Would like to get out more but can't DT Covid.  Can perform necessary chores.  Will get out of the house when he can.  No change in death thoughts and anxiety in intensity but is better with frequency.  Usually comes and goes in waves but more persistent.  Consistent with meds.  Ativan not helping anxiety very dramatically but he's not sure.  Fidgety.  No trigger other than still grieving relationship and can't get it out of his head.  Poor energy, concentration and more forgetful. In bed more and less active.  Sleep ok lately which is unusual.  Can concentrate on financial matters and stock market at times.  Tolerating meds.  Still some intrusive SI without reason. Less frequent obsessive thoughts about broken relationship and has been doing this for months.  Can't let it go.  Loop.   No unusual stress even with the family who is supportive. Likes the benefit that Zyprexa gives. No sleepwalking nor falling nor odd behavior.  No history of sleep walking.  He understands this may recur with an increase.  Also wants option to rarely take extra quetiapine 300 for sleep prn. Plan:  Try to reduce lorazepam if possible.  10/22/2019 appointment with the following noted: Continues fluoxetine 60, lithium 600  mg, lorazepam 2 mg AM and HS and 1 mg midday, olanzapine 10, quetiapine 600 mg HS. Still anxious chronically and including driving in crowded spaces.  Doesn't thing Ativan helps as well as Xanax but less cognitive problems. Sleep is not as good.  Occ EFA.  More EMA and wanting to do things in the middle of the night but this is not typical.  Wonders about why that happens.  Some napping.   Tolerating meds.  Asks about weight loss meds.  Disc this in detail.   Plan no changes except OK meclizine prn vertigo.  02/11/20 appt with the following noted: Able to gradually reduce  lorazepam to 1 mg AM and HS. More depressed over time and less interested in things and less interest in going out but does with his parents. Has reduced from 800 to 600 mg HS with some awakening but usually able to go back to sleep.Albert [name]  Spending a good amount of time in bed bc watches TV in bedroom.  Parents watch TV in different part of the house.  No mood swings he notices.   Concerns about weight gain about 200#. Likes olanzapine's benefit for sleep. Plan: Trial for TRD to  Increase fluoxetine to 80 mg daily  to use the combo with olanzapine for TR bipolar depression.   04/21/20 appt with following noted: I thought in beginning some benefit with fluoxetine.  Lifelong negative thinking continues.   Not much bipolar.  Reads a lot on bipolar.   Still tired and anxious a little more may be seasonal.  No familial stressors.  Tends to lay down in afternoon.  Anxiety not over anything in particular.   No SE with fluoxetine. Took meclizine prn.  Easily motion sick.    Asks questions about newer drugs for bipolar like Caplyta. Plan:  No med changes  06/30/20 appt noted: Tired of wearing masks.  Vaccinated.  Asked questions about when this will end with mask mandate.  Mood up and down some since here and getting up 2-3 times.  Disc awakening problems.  Trying to do better bc night eating some.   Mood is about average today so far.  Albert [name] out with friends for breakfast.  Recognizes activity helps mood.   3 days in a row napped a lot in the afternoon.   Bed is a comfort place.  Gets bored and hard to motivate.  Tries to stay away from night eating and spending.   More depressed when alone and wonders about med changes.  Still some memory issues.  sleep 5-6 hours nightly.  Trying to stay out of bed when watching TV.  Some awakening but managing. Occ fleeting SI and distracts himself.   Always spends a lot of time just laying around.   Can have anxiety for no reason like coming here, and varies in intensity  without pattern.      Less panic than in the past.  Some chronic depression and hyperactivity and loudness and hyperverbal.  Usually back to sleep.  Total 6 hours but some napping, awakens 2-4 times nightly but back to sleep..  Taking quetiapine 600 mg HS.  still lacks interest and motivation.  Can follow a TV show if interested.  Psych med hx extensive including ECT and  risperidone, lithium 1200 SE, Zyprexa 20-15 akathisia, Latuda 80 which caused akathisia, Abilify, Vraylar, Rexulti, aripiprazole 20 mg with akathisia, Seroquel 1000 mg,  InVega, Geodon, Saphris with side effects, symbyax, Fanapt NR . lamotrigine 300 mg, Depakote 2000  mg, Tegretol,Trileptal and several of these in combinations, gabapentin, N-acetylcysteine, Nuedexta,  clonidine,  Belsomra with no response,  Lunesta no response, trazodone 200 mg, Xanax, clonazepam, lorazepam less sedation. Viibryd 40 mg for 3 months with diarrhea, protriptyline with side effects, Trintellix 20 mg, Parnate 50 mg with no response,  imipramine, venlafaxine,  Emsam 12 mg for 2 months,  bupropion was side effects,  Lexapro 20 mg, sertraline, paroxetine, Deplin, fluoxetine 80 methylphenidate 60 mg,  Vyvanse, Concerta, strattera, , modafinil,  pramipexole,  amantadine , Patient prone to akathisia.   Review of Systems:  Review of Systems  Respiratory: Positive for shortness of breath.   Cardiovascular: Negative for palpitations.  Musculoskeletal: Positive for back pain.  Neurological: Positive for tremors. Negative for dizziness, weakness and headaches.       Fidgety  Psychiatric/Behavioral: Positive for decreased concentration and depression. Negative for agitation, behavioral problems, confusion, dysphoric mood, hallucinations, self-injury, sleep disturbance and suicidal ideas. The patient is nervous/anxious and is hyperactive.     Medications: I have reviewed the patient's current medications.  Current Outpatient Medications  Medication Sig  Dispense Refill  . esomeprazole (NEXIUM) 20 MG capsule Take by mouth.    Albert [name] FLUoxetine (PROZAC) 20 MG capsule Take 80 mg by mouth daily.    . fluticasone (FLONASE) 50 MCG/ACT nasal spray     . levothyroxine (SYNTHROID, LEVOTHROID) 75 MCG tablet Take by mouth.    Albert [name] lisinopril (ZESTRIL) 5 MG tablet Take 5 mg by mouth daily.    Albert [name] lithium carbonate 300 MG capsule Take 2 capsules (600 mg total) by mouth daily. 180 capsule 1  . LORazepam (ATIVAN) 2 MG tablet Take 1 tablet (2 mg total) by mouth in the morning and at bedtime. Reduced to 1 TABLET EVERY MORNING and 1 at night 60 tablet 3  . meclizine (ANTIVERT) 25 MG tablet Take 1 tablet (25 mg total) by mouth 3 (three) times daily as needed for dizziness. 30 tablet 0  . OLANZapine (ZYPREXA) 10 MG tablet TAKE ONE TABLET AT BEDTIME 90 tablet 0  . QUEtiapine (SEROQUEL) 300 MG tablet TAKE TWO TABLETS AT BEDTIME 180 tablet 0  . sildenafil (REVATIO) 20 MG tablet Take 5 tablets (100 mg total) by mouth as needed. 30 tablet 11  . tamsulosin (FLOMAX) 0.4 MG CAPS capsule Take 1 capsule (0.4 mg total) by mouth daily. 90 capsule 1  . fenofibrate (TRICOR) 145 MG tablet Take by mouth.    . ondansetron (ZOFRAN ODT) 4 MG disintegrating tablet Take 1 tablet (4 mg total) by mouth every 8 (eight) hours as needed for nausea or vomiting. (Patient not taking: Reported on 06/30/2020) 20 tablet 0   No current facility-administered medications for this visit.   Medication Side Effects: Other: mild sleepiness.  Occ twitches.\, tremor  Allergies:  Allergies  Allergen Reactions  . Meloxicam Other (See Comments)    Other reaction(s): Dizziness  . Prednisone   . Wellbutrin [Bupropion]     Past Medical History:  Diagnosis Date  . Anxiety   . Bipolar disorder (HCC)   . Depression   . GERD (gastroesophageal reflux disease)     Family History  Problem Relation Age of Onset  . Prostate cancer Neg Hx   . Bladder Cancer Neg Hx   . Kidney cancer Neg Hx     Social History    Socioeconomic History  . Marital status: Single    Spouse name: Not on file  . Number of children: Not on file  . Years of  education: Not on file  . Highest education level: Not on file  Occupational History  . Not on file  Tobacco Use  . Smoking status: Former Games developer  . Smokeless tobacco: Never Used  Vaping Use  . Vaping Use: Never used  Substance and Sexual Activity  . Alcohol use: Not Currently  . Drug use: Never  . Sexual activity: Not on file  Other Topics Concern  . Not on file  Social History Narrative  . Not on file   Social Determinants of Health   Financial Resource Strain: Not on file  Food Insecurity: Not on file  Transportation Needs: Not on file  Physical Activity: Not on file  Stress: Not on file  Social Connections: Not on file  Intimate Partner Violence: Not on file    Past Medical History, Surgical history, Social history, and Family history were reviewed and updated as appropriate.   Please see review of systems for further details on the patient's review from today.   Objective:   Physical Exam:  There were no vitals taken for this visit.  Physical Exam Constitutional:      General: He is not in acute distress.    Appearance: He is well-developed.  Musculoskeletal:        General: No deformity.  Neurological:     Mental Status: He is alert and oriented to person, place, and time.     Cranial Nerves: No dysarthria.     Motor: Tremor present.     Coordination: Coordination normal.     Comments: Slight tremor  Psychiatric:        Attention and Perception: Attention and perception normal. He does not perceive auditory or visual hallucinations.        Mood and Affect: Mood is anxious and depressed. Affect is not labile, blunt, angry, tearful or inappropriate.        Speech: Speech normal. Speech is not slurred.        Behavior: Behavior normal. Behavior is not slowed. Behavior is cooperative.        Thought Content: Thought content is not  paranoid or delusional. Thought content includes suicidal ideation. Thought content does not include homicidal ideation. Thought content does not include homicidal or suicidal plan.        Cognition and Memory: Cognition normal. He exhibits impaired recent memory.        Judgment: Judgment normal.     Comments: Insight fair Memory is better than it was but still complains He is chronically anxious  No manic episodes noted. Less fidgety than usual. Some wordfinding problems ongoing.     Lab Review:     Component Value Date/Time   NA 137 04/18/2019 1612   NA 142 03/20/2014 0514   K 3.9 04/18/2019 1612   K 4.1 03/20/2014 0514   CL 106 04/18/2019 1612   CL 112 (H) 03/20/2014 0514   CO2 20 (L) 04/18/2019 1612   CO2 26 03/20/2014 0514   GLUCOSE 148 (H) 04/18/2019 1612   GLUCOSE 99 03/20/2014 0514   BUN 13 04/18/2019 1612   BUN 7 03/20/2014 0514   CREATININE 1.60 (H) 06/27/2020 1007   CREATININE 0.78 03/20/2014 0514   CALCIUM 9.5 04/18/2019 1612   CALCIUM 9.5 03/20/2014 0514   PROT 7.8 04/18/2019 1612   PROT 7.3 03/18/2014 1459   ALBUMIN 4.6 04/18/2019 1612   ALBUMIN 3.2 (L) 03/18/2014 1459   AST 24 04/18/2019 1612   AST 16 03/18/2014 1459   ALT 18 04/18/2019  1612   ALT 16 03/18/2014 1459   ALKPHOS 40 04/18/2019 1612   ALKPHOS 105 03/18/2014 1459   BILITOT 0.7 04/18/2019 1612   BILITOT 0.3 03/18/2014 1459   GFRNONAA 47 (L) 04/18/2019 1612   GFRNONAA >60 03/20/2014 0514   GFRAA 54 (L) 04/18/2019 1612   GFRAA >60 03/20/2014 0514       Component Value Date/Time   WBC 6.4 04/18/2019 1612   RBC 4.69 04/18/2019 1612   HGB 14.6 04/18/2019 1612   HGB 12.6 (L) 03/20/2014 0514   HCT 44.8 04/18/2019 1612   HCT 38.4 (L) 03/20/2014 0514   PLT 406 (H) 04/18/2019 1612   PLT 346 03/20/2014 0514   MCV 95.5 04/18/2019 1612   MCV 89 03/20/2014 0514   MCH 31.1 04/18/2019 1612   MCHC 32.6 04/18/2019 1612   RDW 12.8 04/18/2019 1612   RDW 12.5 03/20/2014 0514   LYMPHSABS 3.2  03/20/2014 0514   MONOABS 1.0 03/20/2014 0514   EOSABS 0.4 03/20/2014 0514   BASOSABS 0.1 03/20/2014 0514    Lithium Lvl  Date Value Ref Range Status  03/13/2019 0.8 0.6 - 1.2 mmol/L Final    Comment:                                     Detection Limit = 0.1                           <0.1 indicates None Detected     Last lithium level Sept 0.8.   Last lithium level July 27, 2018 was normal at 1.0.   Lithium level [facility] October 03, 2018 = 1.2. Said he got lithium level at [facility] as requested.  Labs not in Epic.  Recent lipids ok except higher TG than usual.  Normal A1C.  No results found for: PHENYTOIN, PHENOBARB, VALPROATE, CBMZ   .res Assessment: Plan:    Nedra HaiLee was seen today for follow-up, severe bipolar i disorder, current or most recent episode d and depression.  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  He usually has mixed bipolar symptoms which we have not been able to completely eliminate.   Not much subjective change since here.  He is having less hyperactive or manic symptoms.  See long list of meds tried.   He is somewhat chronically unstable and recently has been worse because of problems related to benzodiazepines specifically Xanax from which she was having side effects those have resolved.Albert [name].  He is cognition does seem better with the switch from alprazolam to lorazepam, per the patient and his mother but is gradually getting worse again..   We discussed the short-term risks associated with benzodiazepines including sedation and increased fall risk among others.  Discussed long-term side effect risk including dependence, potential withdrawal symptoms, and the potential eventual dose-related risk of dementia. Disc  newer studies refute dementia risks.  Unfortunately due to the severity of set his symptoms polypharmacy is a necessity.  Disc unusual combo antipsychotics but it helps more than other options.  Discussed potential metabolic side effects associated with atypical antipsychotics, as well as potential risk for movement side effects. Advised pt  to contact office if movement side effects occur.  He had some falling around the time when he previously took olanzapine but he took it for several months at this dosage before he had any side effect issues and he was on Xanax at the time that the following occurred. Take LED Seroquel to sleep.   No obvious alternatives for sleep  Discussed safety plan at length with patient.  Advised patient to contact office with any worsening signs and symptoms.  Instructed patient to go to the [facility] emergency room for evaluation if experiencing any acute safety concerns, to include suicidal intent. "I don't want to do it" .  Call if there is any worsening.   Lithium being used bc chronic SI and death thoughts.   Counseled patient regarding potential benefits, risks, and side effects of lithium to include potential risk of lithium affecting thyroid and renal function.  Discussed need for periodic lab monitoring to determine drug level and to assess for potential adverse effects.  Counseled patient regarding signs and symptoms of lithium toxicity and advised that they notify office immediately or seek urgent medical attention if experiencing these signs and symptoms.  Patient advised to contact office with any questions or concerns. Continue lithium to 2 of the 300 mg capsules. Lithium level 0.8.  On 750 mg daily.   Call if death thoughts worsen or worsening SI. Prefer only 1 med change at a time. If nothing worse will decrease lithium again DT kidney concerns  Failed response to olanzapine + fluoxetine for TR bipolar depression.   Reduce fluoxetine to 1 daily and  reduce olanzapine to one half nightly for 10 days and then stop it. Then start Caplyta 1 daily Disc SE. Disc combo with Seroquel may lower response rate but he doesn't think he can sleep without Seroquel.  He says he can't reduce quetiapine further bc fear of insomnia.   Option reduce lorazepam again due to memory problems.  Triggered withdrawal in the past bc reduced too much.Not ideal to be taking so much antipsychotic.     Continue lorazepam to 1 tablet in the morning,  and 1 tablet in the evening.  We discussed the short-term risks associated with benzodiazepines including sedation and increased fall risk among others.  Discussed long-term side effect risk including dependence, potential withdrawal symptoms, and the potential eventual dose-related risk of dementia.  But recent studies from 2020 dispute this association between benzodiazepines and dementia risk. Newer studies in 2020 do not support an association with dementia.  Had reduced Quetiapine 600 mg at night for sleep.     Asks for prn meclizine for dizziness.  History vertigo and motion sickness.    Disc Ozempic for weight loss.  Has had weight gain from meds as a contributor.  This appt was 30 mins.  FU 8 weeks   Meredith Staggers, MD, DFAPA  Future Appointments  Date Time Provider Department Center  12/25/2020  1:30 PM Sondra Come, MD [facility] None    No orders of the defined types were placed in this encounter.     -------------------------------

## 2020-07-16 ENCOUNTER — Other Ambulatory Visit: Payer: Self-pay

## 2020-07-16 ENCOUNTER — Telehealth: Payer: Self-pay | Admitting: Psychiatry

## 2020-07-16 DIAGNOSIS — F314 Bipolar disorder, current episode depressed, severe, without psychotic features: Secondary | ICD-10-CM

## 2020-07-16 MED ORDER — OLANZAPINE 2.5 MG PO TABS: ORAL_TABLET | ORAL | 0 refills | Status: DC

## 2020-07-16 NOTE — Telephone Encounter (Signed)
He has had memory concerns for a long period of time.  Sometimes they are worse than others.  Please get information on any recent changes that he is noted.  2 weeks ago at his last appointment we did initiate a change from olanzapine to Caplyta.  But it is highly unlikely that would be negatively affecting his memory.  I certainly want to give that change and opportunity.  Make sure he did make the change.  If anything that change should improve his memory over time.

## 2020-07-16 NOTE — Telephone Encounter (Signed)
Albert [name] called in to see if someone could call him back concerning his medication. Pt said that he is having memory problems and would like to speak to the nurse. Please call.

## 2020-07-16 NOTE — Telephone Encounter (Signed)
Pt given instructions and wrote them down. Will call back with any further questions.

## 2020-07-16 NOTE — Telephone Encounter (Signed)
Rtc to patient and he should have been contacted before response back to explain himself. He misunderstood the instructions on his after care summary. He thought it said take Prozac just 1 time then stop which he did. He did taper off Olanzapine correctly. He only took Caplyta x 3 days because he felt so bad not realizing it was probably coming from him stopping Prozac. He feels he is in withdrawals and asking to get back on track and feel better. He just realized today what he had done wrong.   Informed him I would call him back as soon as I could catch Dr. Jennelle Human. He was very Adult nurse.

## 2020-07-16 NOTE — Telephone Encounter (Signed)
He could be having withdrawal from coming off the olanzapine more likely than the fluoxetine but it could be either. I want him to give the Caplyta a chance of continue Caplyta 1 daily., Resume fluoxetine 1 every other day and resume olanzapine one quarter of a 10 mg tablet nightly for 10 days and then stop both fluoxetine and olanzapine.

## 2020-07-17 ENCOUNTER — Telehealth: Payer: Self-pay | Admitting: Psychiatry

## 2020-07-17 NOTE — Telephone Encounter (Signed)
Rtc to pt and confirmed he continues same dose for Ativan according to office visit, Ativan 2 mg 1 tablet in the morning and 1 tablet at bedtime.

## 2020-07-17 NOTE — Telephone Encounter (Signed)
Albert [name] called and said that he needs to know if he needs to take the lorazapam at night or not.Please give him a call at [phone]

## 2020-07-18 ENCOUNTER — Other Ambulatory Visit: Payer: Self-pay | Admitting: Urology

## 2020-07-18 DIAGNOSIS — N401 Enlarged prostate with lower urinary tract symptoms: Secondary | ICD-10-CM

## 2020-07-29 ENCOUNTER — Telehealth: Payer: Self-pay | Admitting: Psychiatry

## 2020-07-29 ENCOUNTER — Other Ambulatory Visit: Payer: Self-pay

## 2020-07-29 DIAGNOSIS — F314 Bipolar disorder, current episode depressed, severe, without psychotic features: Secondary | ICD-10-CM

## 2020-07-29 MED ORDER — CAPLYTA 42 MG PO CAPS: 42.0000 mg | ORAL_CAPSULE | Freq: Every day | ORAL | 1 refills | Status: DC

## 2020-07-29 NOTE — Telephone Encounter (Signed)
Rx sent but will need a PA. Pt may need samples depending on timing

## 2020-07-29 NOTE — Telephone Encounter (Signed)
Next visit is 08/27/20. Got samples of Caplyta on last visit and it is doing well for him. Can he get a refill called in to:  [facility] - [place], [place] - [address] Phone:  [phone]  Fax:  [phone]

## 2020-08-01 ENCOUNTER — Telehealth: Payer: Self-pay

## 2020-08-01 NOTE — Telephone Encounter (Signed)
Prior approval received for CAPLYTA 42 MG CAPSULE effective 07/30/2020-05/02/2021 with [facility] ID# [id number]

## 2020-08-04 ENCOUNTER — Other Ambulatory Visit: Payer: Self-pay

## 2020-08-04 ENCOUNTER — Ambulatory Visit: Payer: Medicare PPO | Admitting: Surgery

## 2020-08-04 ENCOUNTER — Telehealth: Payer: Self-pay

## 2020-08-04 ENCOUNTER — Encounter: Payer: Self-pay | Admitting: Surgery

## 2020-08-04 VITALS — BP 104/70 | HR 83 | Temp 98.8°F | Ht 70.0 in | Wt 200.0 lb

## 2020-08-04 DIAGNOSIS — K219 Gastro-esophageal reflux disease without esophagitis: Secondary | ICD-10-CM | POA: Diagnosis not present

## 2020-08-04 DIAGNOSIS — K449 Diaphragmatic hernia without obstruction or gangrene: Secondary | ICD-10-CM | POA: Diagnosis not present

## 2020-08-04 NOTE — Addendum Note (Signed)
Addended by: Sinda Du on: 08/04/2020 04:38 PM   Modules accepted: Orders

## 2020-08-04 NOTE — Telephone Encounter (Signed)
Called and spoke with the patient and went over the information. I have also sent him a MyChart message about this.   The patient is scheduled for a Barium Swallow study at [facility] on 09/03/20 at 9:00 am. He will need to [facility] by 8:30 am. He will have nothing to eat or drink for 4 hours prior.

## 2020-08-04 NOTE — Addendum Note (Signed)
Addended by: Sinda Du on: 08/04/2020 04:34 PM   Modules accepted: Orders

## 2020-08-04 NOTE — Patient Instructions (Addendum)
We will refer you to [facility] to have you get an Upper Endoscopy done. They will call you to schedule this appointment.  We will get you set up for a Barium Swallow study, this will be done a [facility] hospital.  We will call you about this appointment.   Follow up here in about 2 months.

## 2020-08-04 NOTE — Progress Notes (Signed)
Patient ID: Albert [name], male   DOB: 1965/12/06, 55 y.o.   MRN: [id number]  HPI Albert [name] is a 55 y.o. male seen in consultation at the request of Dr. Maia Plan ( case d/w him in detail) he does have a significant history for esophageal hernia that was repaired about 15 years ago by Dr. Michela Pitcher.  I do not have access to the operative report or medical records at this time.  He reports that before his hiatal hernia severe reflux that was recalcitrant.  Over the last year or so he now reports that some of his symptoms are back but in a milder form.  He has some occasional dysphagia for solids but denies any prior impactions.  He also reports that his reflux is bad but currently is controlled.  He has noticed a bulge within the abdominal wall that is tender intermittently.  There is no specific aggravating or aggravating factors.  He is able to reduce the ventral hernia In addition to this he feels porches on bilateral groins and there is some discomfort with intermittent pinching. He did have a CT scan of the abdomen and pelvis that personally reviewed viewed.  I have shared the images with him and have performed significant education.  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  There is no evidence of any acute worrisome findings.  There is evidence of an incisional hernia as well as bilateral inguinal hernias.  There is no other acute intra-abdominal abnormalities. Is able to perform more than 4 METS of activity without any shortness of breath or chest pain. He does have bipolar disorder but is controlled with lithium and atypical antipsychotics.  No recent hospitalization for mental illnesses.  He is competent. He does have chronic renal insufficiency with a baseline creatinine of 1.7, CBC is completely normal  HPI  Past Medical History:  Diagnosis Date  . Anxiety   . Bipolar disorder (HCC)   . Depression   . GERD (gastroesophageal reflux disease)     Past Surgical History:  Procedure  Laterality Date  . BACK SURGERY    . EYE SURGERY    . HERNIA REPAIR     Hiatal Hernia  . SPINE SURGERY      Family History  Problem Relation Age of Onset  . Prostate cancer Neg Hx   . Bladder Cancer Neg Hx   . Kidney cancer Neg Hx     Social History Social History   Tobacco Use  . Smoking status: Former Games developer  . Smokeless tobacco: Never Used  Vaping Use  . Vaping Use: Never used  Substance Use Topics  . Alcohol use: Not Currently  . Drug use: Never    Allergies  Allergen Reactions  . Meloxicam Other (See Comments)    Other reaction(s): Dizziness  . Prednisone   . Wellbutrin [Bupropion]     Current Outpatient Medications  Medication Sig Dispense Refill  . esomeprazole (NEXIUM) 20 MG capsule Take by mouth.    . fluticasone (FLONASE) 50 MCG/ACT nasal spray     . levothyroxine (SYNTHROID, LEVOTHROID) 75 MCG tablet Take by mouth.    [name] lisinopril (ZESTRIL) 5 MG tablet Take 5 mg by mouth daily.    [name] lithium carbonate 300 MG capsule Take 2 capsules (600 mg total) by mouth daily. 180 capsule 1  . Lumateperone Tosylate (CAPLYTA) 42 MG CAPS Take 42 mg by mouth daily. 30 capsule 1  . meclizine (ANTIVERT) 25 MG tablet Take 1 tablet (25 mg  total) by mouth 3 (three) times daily as needed for dizziness. 30 tablet 0  . ondansetron (ZOFRAN ODT) 4 MG disintegrating tablet Take 1 tablet (4 mg total) by mouth every 8 (eight) hours as needed for nausea or vomiting. 20 tablet 0  . QUEtiapine (SEROQUEL) 300 MG tablet Take 600 mg by mouth at bedtime.    . sildenafil (REVATIO) 20 MG tablet Take 5 tablets (100 mg total) by mouth as needed. 30 tablet 11  . tamsulosin (FLOMAX) 0.4 MG CAPS capsule TAKE 1 CAPSULE BY MOUTH ONCE DAILY 90 capsule 3  . fenofibrate (TRICOR) 145 MG tablet Take by mouth.     No current facility-administered medications for this visit.     Review of Systems Full ROS  was asked and was negative except for the information on the HPI  Physical Exam Blood pressure  104/70, pulse 83, temperature 98.8 F (37.1 C), height 5\' 10"  (1.778 m), weight 200 lb (90.7 kg), SpO2 95 %. CONSTITUTIONAL: NAD. EYES: Pupils are equal, round,  Sclera are non-icteric. EARS, NOSE, MOUTH AND THROAT: He is wearing a mask. Hearing is intact to voice. LYMPH NODES:  Lymph nodes in the neck are normal. RESPIRATORY:  Lungs are clear. There is normal respiratory effort, with equal breath sounds bilaterally, and without pathologic use of accessory muscles. CARDIOVASCULAR: Heart is regular without murmurs, gallops, or rubs. GI: The abdomen is soft, Reducible incisional hernia periumbilical ara about 2 cms in size, Reducible bilateral inguinal hernias. GU: Rectal deferred.   MUSCULOSKELETAL: Normal muscle strength and tone. No cyanosis or edema.   SKIN: Turgor is good and there are no pathologic skin lesions or ulcers. NEUROLOGIC: Motor and sensation is grossly normal. Cranial nerves are grossly intact. PSYCH:  Oriented to person, place and time. Affect is normal.  Data Reviewed  I have personally reviewed the patient's imaging, laboratory findings and medical records.    Assessment/Plan 55 year old with type III recurrent paraesophageal hernia bilateral inguinal hernias and ventral hernias. Issues that are more pressing and more symptomatic seem to be the ventral/incisional hernia and a hiatal hernia.  He has not completed his work-up.  We will need an endoscopic evaluation of his stomach and esophagus prior to any redo operation.  In addition to that we will need to evaluate the function of the esophagus and I will order a barium swallow.  I suspect That His Esophageal Function Is intact based on prior imaging. He expressed to me he is interested in having all these for hernia as performed during 1 operative setting. I do think that theoretically this is possible although I am not necessarily certain that that might be the right thing to do.  I expressed that I have concerns of him be  in the operating room for 6 or 7 hours.  Alternatively I offer him to do the incisional hernia and the paraesophageal hernia during 1 operative set up.  I do think that this is a more reasonable approach that would limit his potential complications and perioperative morbidity .  He is in agreement with me regarding this and wished to proceed further work-up Extensive counseling provided. Time spent with the patient was 60 minutes, with more than 50% of the time spent in face-to-face education, counseling and care coordination.     57, MD FACS General Surgeon 08/04/2020, 10:40 AM

## 2020-08-06 ENCOUNTER — Telehealth: Payer: Self-pay | Admitting: Psychiatry

## 2020-08-06 NOTE — Telephone Encounter (Signed)
Noted. Thanks.

## 2020-08-06 NOTE — Telephone Encounter (Signed)
Carly called to report that since starting the Caplyta about 3 weeks ago he is experience suicidal thoughts, taste is off, getting his words out is difficult, and other side effects.  Needs to get off this medication.  Please call to discuss.  appt 4/27.

## 2020-08-06 NOTE — Telephone Encounter (Signed)
Please review.Do I need to get more info?

## 2020-08-06 NOTE — Telephone Encounter (Signed)
Rtc to pt and he reports not long after starting Caplyta he started having symptoms, they just kept worsening. He verbalized understanding of stopping it. He is safe, the negative thoughts he is having he would not act on, he just reports they are in his mind. He reports he will just be taking the Lithium and Seroquel now. Instructed him to call if sx's worsen or go to ER if becomes suicidal. He agreed. Informed him I would follow up with him Friday.

## 2020-08-06 NOTE — Telephone Encounter (Signed)
We took him off olanzapine in order to start the Caplyta and also reduced the fluoxetine.  To ensure and verify that the problems he is experiencing are side effects of Caplyta stop it immediately.  We will wait for the negative effects to go away assuming his symptoms are side effects.  If they do not go away let us know.  We should not make any other med changes at the same time.  Please verify that he can keep himself safe and if there is any concerns about his ability to do so please let me know.

## 2020-08-08 ENCOUNTER — Other Ambulatory Visit: Payer: Self-pay

## 2020-08-08 ENCOUNTER — Telehealth: Payer: Self-pay | Admitting: Psychiatry

## 2020-08-08 MED ORDER — QUETIAPINE FUMARATE 300 MG PO TABS: ORAL_TABLET | ORAL | 0 refills | Status: DC

## 2020-08-08 NOTE — Telephone Encounter (Signed)
Pt notified of increase. Rx updated for 3 nights extra tablet to [facility]. Pt will call back next week if still not better.

## 2020-08-08 NOTE — Telephone Encounter (Signed)
The Caplyta is not fully out of his system so is probably still causing the insomnia.  By the end of the weekend it should be gone.  If he wants he can take 3 Seroquel for 3 nights then drop back to 2 nightly.  If he's still not sleeping next week, call us back

## 2020-08-08 NOTE — Telephone Encounter (Signed)
F/u call made to pt and he reports not sleeping last night at all and barely previous night. His mind is racing/rapid thoughts and he can't stop it at night. Currently taking Seroquel 300 mg 2 at hs, asking should he increase his dose? He did stop Caplyta per phone call on Wednesday.

## 2020-08-08 NOTE — Telephone Encounter (Signed)
Patient spoke with TS about sending in prescription. He is not sure of the medication. States that he is waiting on on a CB in order to determine which pharmacy to send prescription to. Kazuki states that his pharmacy [facility] closes at five and they if need be prescription can be sent to [facility] [address] [place], [place]

## 2020-08-10 ENCOUNTER — Encounter: Payer: Self-pay | Admitting: Psychiatry

## 2020-08-10 ENCOUNTER — Telehealth: Payer: Self-pay | Admitting: Psychiatry

## 2020-08-10 NOTE — Telephone Encounter (Addendum)
Received after hours call from pt. He reports that he started Caplyta and stopped it Wednesday due to side effects. He increased Seroquel to 900 mg po QHS on 08/08/20 as directed. He reports that he has been having more "severe sx's" to include fleeting thoughts of hurting himself and others, racing thoughts, no appetite, and being unable to eat.  He reports that he slept some Friday night after increase in Seroquel and last night had limited sleep.  He reports that he is with his brother and his parents at time of phone call and they are supportive.  He denies any SI or HI at time of phone call "but it comes and goes."  He denies AH, VH, or paranoia.  He asks about medication to take during the daytime.  He reports that he is near [facility] and willing to go to hospital if needed.  Patient contracts for safety.  Discussed that provider would contact Dr. Jennelle Human and review medication list and call patient back.  Patient is in agreement with this plan.

## 2020-08-11 ENCOUNTER — Telehealth: Payer: Self-pay | Admitting: Psychiatry

## 2020-08-11 ENCOUNTER — Encounter: Payer: Self-pay | Admitting: *Deleted

## 2020-08-11 ENCOUNTER — Emergency Department
Admission: EM | Admit: 2020-08-11 | Discharge: 2020-08-12 | Payer: Medicare PPO | Attending: Emergency Medicine | Admitting: Emergency Medicine

## 2020-08-11 ENCOUNTER — Other Ambulatory Visit: Payer: Self-pay

## 2020-08-11 DIAGNOSIS — N183 Chronic kidney disease, stage 3 unspecified: Secondary | ICD-10-CM | POA: Diagnosis not present

## 2020-08-11 DIAGNOSIS — F3130 Bipolar disorder, current episode depressed, mild or moderate severity, unspecified: Secondary | ICD-10-CM | POA: Insufficient documentation

## 2020-08-11 DIAGNOSIS — E039 Hypothyroidism, unspecified: Secondary | ICD-10-CM | POA: Diagnosis not present

## 2020-08-11 DIAGNOSIS — R45851 Suicidal ideations: Secondary | ICD-10-CM | POA: Diagnosis not present

## 2020-08-11 DIAGNOSIS — Z20822 Contact with and (suspected) exposure to covid-19: Secondary | ICD-10-CM | POA: Diagnosis not present

## 2020-08-11 DIAGNOSIS — F314 Bipolar disorder, current episode depressed, severe, without psychotic features: Secondary | ICD-10-CM

## 2020-08-11 DIAGNOSIS — Z79899 Other long term (current) drug therapy: Secondary | ICD-10-CM | POA: Insufficient documentation

## 2020-08-11 DIAGNOSIS — Z046 Encounter for general psychiatric examination, requested by authority: Secondary | ICD-10-CM | POA: Diagnosis present

## 2020-08-11 DIAGNOSIS — I129 Hypertensive chronic kidney disease with stage 1 through stage 4 chronic kidney disease, or unspecified chronic kidney disease: Secondary | ICD-10-CM | POA: Diagnosis present

## 2020-08-11 DIAGNOSIS — N189 Chronic kidney disease, unspecified: Secondary | ICD-10-CM | POA: Diagnosis present

## 2020-08-11 DIAGNOSIS — F411 Generalized anxiety disorder: Secondary | ICD-10-CM | POA: Diagnosis present

## 2020-08-11 DIAGNOSIS — D631 Anemia in chronic kidney disease: Secondary | ICD-10-CM | POA: Diagnosis present

## 2020-08-11 DIAGNOSIS — R000 Tachycardia, unspecified: Secondary | ICD-10-CM | POA: Insufficient documentation

## 2020-08-11 DIAGNOSIS — N2581 Secondary hyperparathyroidism of renal origin: Secondary | ICD-10-CM

## 2020-08-11 LAB — COMPREHENSIVE METABOLIC PANEL
ALT: 16 U/L (ref 0–44)
AST: 23 U/L (ref 15–41)
Albumin: 4.7 g/dL (ref 3.5–5.0)
Alkaline Phosphatase: 34 U/L — ABNORMAL LOW (ref 38–126)
Anion gap: 10 (ref 5–15)
BUN: 18 mg/dL (ref 6–20)
CO2: 21 mmol/L — ABNORMAL LOW (ref 22–32)
Calcium: 9.9 mg/dL (ref 8.9–10.3)
Chloride: 104 mmol/L (ref 98–111)
Creatinine, Ser: 1.74 mg/dL — ABNORMAL HIGH (ref 0.61–1.24)
GFR, Estimated: 45 mL/min — ABNORMAL LOW (ref >60)
Glucose, Bld: 113 mg/dL — ABNORMAL HIGH (ref 70–99)
Potassium: 4 mmol/L (ref 3.5–5.1)
Sodium: 135 mmol/L (ref 135–145)
Total Bilirubin: 0.5 mg/dL (ref 0.3–1.2)
Total Protein: 7.8 g/dL (ref 6.5–8.1)

## 2020-08-11 LAB — URINE DRUG SCREEN, QUALITATIVE (ARMC ONLY)
Amphetamines, Ur Screen: NOT DETECTED
Barbiturates, Ur Screen: NOT DETECTED
Benzodiazepine, Ur Scrn: NOT DETECTED
Cannabinoid 50 Ng, Ur ~~LOC~~: NOT DETECTED
Cocaine Metabolite,Ur ~~LOC~~: NOT DETECTED
MDMA (Ecstasy)Ur Screen: POSITIVE — AB
Methadone Scn, Ur: NOT DETECTED
Opiate, Ur Screen: NOT DETECTED
Phencyclidine (PCP) Ur S: NOT DETECTED
Tricyclic, Ur Screen: POSITIVE — AB

## 2020-08-11 LAB — LITHIUM LEVEL: Lithium Lvl: 0.95 mmol/L (ref 0.60–1.20)

## 2020-08-11 LAB — ACETAMINOPHEN LEVEL: Acetaminophen (Tylenol), Serum: <10 ug/mL — ABNORMAL LOW (ref 10–30)

## 2020-08-11 LAB — CBC
HCT: 42.1 % (ref 39.0–52.0)
Hemoglobin: 13.9 g/dL (ref 13.0–17.0)
MCH: 31.6 pg (ref 26.0–34.0)
MCHC: 33 g/dL (ref 30.0–36.0)
MCV: 95.7 fL (ref 80.0–100.0)
Platelets: 474 10*3/uL — ABNORMAL HIGH (ref 150–400)
RBC: 4.4 MIL/uL (ref 4.22–5.81)
RDW: 12.2 % (ref 11.5–15.5)
WBC: 9.3 10*3/uL (ref 4.0–10.5)
nRBC: 0 % (ref 0.0–0.2)

## 2020-08-11 LAB — ETHANOL: Alcohol, Ethyl (B): <10 mg/dL (ref <10)

## 2020-08-11 LAB — SALICYLATE LEVEL: Salicylate Lvl: <7 mg/dL — ABNORMAL LOW (ref 7.0–30.0)

## 2020-08-11 MED ORDER — QUETIAPINE FUMARATE 25 MG PO TABS
900.0000 mg | ORAL_TABLET | Freq: Every day | ORAL | Status: DC
  Administered 2020-08-12: 900 mg via ORAL
  Filled 2020-08-11: qty 4

## 2020-08-11 MED ORDER — LITHIUM CARBONATE 300 MG PO CAPS
600.0000 mg | ORAL_CAPSULE | Freq: Once | ORAL | Status: AC
  Administered 2020-08-12: 600 mg via ORAL
  Filled 2020-08-11: qty 2

## 2020-08-11 MED ORDER — HYDROXYZINE HCL 25 MG PO TABS
50.0000 mg | ORAL_TABLET | Freq: Once | ORAL | Status: AC
  Administered 2020-08-11: 50 mg via ORAL
  Filled 2020-08-11: qty 2

## 2020-08-11 MED ORDER — LORAZEPAM 2 MG PO TABS
2.0000 mg | ORAL_TABLET | Freq: Once | ORAL | Status: AC
  Administered 2020-08-11: 2 mg via ORAL
  Filled 2020-08-11: qty 1

## 2020-08-11 NOTE — Telephone Encounter (Signed)
Telephone call noted elsewhere.  Patient sent to [facility] for homicidal suicidal thoughts associated with bipolar mixed symptoms.

## 2020-08-11 NOTE — BH Assessment (Signed)
Comprehensive Clinical Assessment (CCA) Note  08/11/2020 Albert [name] [id number]  Chief Complaint: Patient is a 55 year old male presenting to [facility] ED initially voluntary but has since been IVC'd by attending ER doctor. Per triage note Pt ambulatory to triage.  Pt reports SI and HI.  Denies drug or etoh use.  Sx for 2-3 weeks, worse past 5 days.  Pt alert.  Pt calm and cooperative. During assessment patient was alert and oriented x4, calm and cooperative, patient's mood appears depressed. Patient reports why he is presenting to the ED "suicidal thoughts, thinking about hurting myself and other people." "I've never done anything to myself before." Patient reports being hospitalized in 2005 due to having SI in the past. Patient reports that he lives alone but has been staying with his parents due to not feeling safe at home by himself. Patient reports lack of sleep "I have slept in 3 or 4 days" and also reports an inconsistent appetite. Patient reports continued SI, he denies any plan currently but had a plan earlier today to grab a police officers handgun to harm himself or force a police officer to shoot him. Patient reports HI but does not report any plans and does not report anyone in particular that he would hurt. Patient does currently have a psychiatrist with [facility] "Maurice March". Patient denies any substance use although patient's UDS is positive for Ecstasy, patient denies any use of Ecstasy. Patient denies AH/VH, continues to report SI/HI.  Per Psyc NP Elenore Paddy patient is recommended for Inpatient Hospitalization  Chief Complaint  Patient presents with  . Suicidal   Visit Diagnosis: Bipolar 1 Disorder, current or most recent episode depressed   CCA Screening, Triage and Referral (STR)  Patient Reported Information How did you hear about [place]? Self  Referral name: No data recorded Referral phone number: No data recorded  Whom do you see for routine medical problems? Other  (Comment)  Practice/Facility Name: No data recorded Practice/Facility Phone Number: No data recorded Name of Contact: No data recorded Contact Number: No data recorded Contact Fax Number: No data recorded Prescriber Name: No data recorded Prescriber Address (if known): No data recorded  What Is the Reason for Your Visit/Call Today? No data recorded How Long Has This Been Causing You Problems? 1-6 months  What Do You Feel Would Help You the Most Today? Treatment for Depression or other mood problem   Have You Recently Been in Any Inpatient Treatment (Hospital/Detox/Crisis Center/28-Day Program)? No  Name/Location of Program/Hospital:No data recorded How Long Were You There? No data recorded When Were You Discharged? No data recorded  Have You Ever Received Services From [facility] Before? No  Who Do You See at [facility]? No data recorded  Have You Recently Had Any Thoughts About Hurting Yourself? Yes  Are You Planning to Commit Suicide/Harm Yourself At This time? No   Have you Recently Had Thoughts About Hurting Someone Karolee Ohs? Yes  Explanation: No data recorded  Have You Used Any Alcohol or Drugs in the Past 24 Hours? No  How Long Ago Did You Use Drugs or Alcohol? No data recorded What Did You Use and How Much? No data recorded  Do You Currently Have a Therapist/Psychiatrist? No  Name of Therapist/Psychiatrist: No data recorded  Have You Been Recently Discharged From Any Office Practice or Programs? No  Explanation of Discharge From Practice/Program: No data recorded    CCA Screening Triage Referral Assessment Type of Contact: Face-to-Face  Is this Initial  or Reassessment? No data recorded Date Telepsych consult ordered in [facility]:  No data recorded Time Telepsych consult ordered in [facility]:  No data recorded  Patient Reported Information Reviewed? Yes  Patient Left Without Being Seen? No data recorded Reason for Not Completing Assessment: No data  recorded  Collateral Involvement: No data recorded  Does Patient Have a Court Appointed Legal Guardian? No data recorded Name and Contact of Legal Guardian: No data recorded If Minor and Not Living with Parent(s), Who has Custody? No data recorded Is CPS involved or ever been involved? Never  Is APS involved or ever been involved? Never   Patient Determined To Be At Risk for Harm To Self or Others Based on Review of Patient Reported Information or Presenting Complaint? No  Method: No data recorded Availability of Means: No data recorded Intent: No data recorded Notification Required: No data recorded Additional Information for Danger to Others Potential: No data recorded Additional Comments for Danger to Others Potential: No data recorded Are There Guns or Other Weapons in Your Home? No data recorded Types of Guns/Weapons: No data recorded Are These Weapons Safely Secured?                            No data recorded Who Could Verify You Are Able To Have These Secured: No data recorded Do You Have any Outstanding Charges, Pending Court Dates, Parole/Probation? No data recorded Contacted To Inform of Risk of Harm To Self or Others: No data recorded  Location of Assessment: [facility] ED   Does Patient Present under Involuntary Commitment? Yes  IVC Papers Initial File Date: 08/11/2020   [place] of Residence: [place]   Patient Currently Receiving the Following Services: No data recorded  Determination of Need: Emergent (2 hours)   Options For Referral: No data recorded    CCA Biopsychosocial Intake/Chief Complaint:  Patient presents under IVC due to suicidal thoughts with a plan  Current Symptoms/Problems: Patient presents under IVC due to suicidal thoughts with a plan   Patient Reported Schizophrenia/Schizoaffective Diagnosis in Past: No   Strengths: Patient is able to communicate his needs  Preferences: Unknown  Abilities: Patient is able to communicate his  needs   Type of Services Patient Feels are Needed: Inpatient   Initial Clinical Notes/Concerns: None   Mental Health Symptoms Depression:  Hopelessness; Sleep (too much or little); Worthlessness; Change in energy/activity   Duration of Depressive symptoms: Greater than two weeks   Mania:  None   Anxiety:   Difficulty concentrating; Restlessness; Worrying   Psychosis:  None   Duration of Psychotic symptoms: No data recorded  Trauma:  None   Obsessions:  None   Compulsions:  None   Inattention:  None   Hyperactivity/Impulsivity:  N/A   Oppositional/Defiant Behaviors:  None   Emotional Irregularity:  None   Other Mood/Personality Symptoms:  No data recorded   Mental Status Exam Appearance and self-care  Stature:  Average   Weight:  Average weight   Clothing:  Casual   Grooming:  Normal   Cosmetic use:  None   Posture/gait:  Normal   Motor activity:  Not Remarkable   Sensorium  Attention:  Normal   Concentration:  Normal   Orientation:  X5   Recall/memory:  Normal   Affect and Mood  Affect:  Appropriate   Mood:  Depressed   Relating  Eye contact:  Normal   Facial expression:  Depressed   Attitude toward examiner:  Cooperative   Thought and Language  Speech flow: Clear and Coherent   Thought content:  Appropriate to Mood and Circumstances   Preoccupation:  None   Hallucinations:  None   Organization:  No data recorded  [employer] of Knowledge:  Fair   Intelligence:  Average   Abstraction:  Normal   Judgement:  Fair   Dance movement psychotherapist:  Realistic   Insight:  Fair   Decision Making:  Normal   Social Functioning  Social Maturity:  Responsible   Social Judgement:  Normal   Stress  Stressors:  Other (Comment)   Coping Ability:  Normal   Skill Deficits:  None   Supports:  Family     Religion: Religion/Spirituality Are You A Religious Person?: No  Leisure/Recreation: Leisure / Recreation Do You  Have Hobbies?: No  Exercise/Diet: Exercise/Diet Do You Exercise?: No Have You Gained or Lost A Significant Amount of Weight in the Past Six Months?: No Do You Follow a Special Diet?: No Do You Have Any Trouble Sleeping?: No   CCA Employment/Education Employment/Work Situation: Employment / Work Situation Employment situation: Employed Where is patient currently employed?: Unknown, patient is employed part-time How long has patient been employed?: Unknown Patient's job has been impacted by current illness: No Has patient ever been in the [employer]?: No  Education: Education Is Patient Currently Attending School?: No Did You Have An Individualized Education Program (IIEP): No Did You Have Any Difficulty At [employer]?: No Patient's Education Has Been Impacted by Current Illness: No   CCA Family/Childhood History Family and Relationship History: Family history Marital status: Single Are you sexually active?: No What is your sexual orientation?: Unknown Has your sexual activity been affected by drugs, alcohol, medication, or emotional stress?: Unknown Does patient have children?: No  Childhood History:  Childhood History By whom was/is the patient raised?: Both parents Additional childhood history information: None reported Description of patient's relationship with caregiver when they were a child: None reported Patient's description of current relationship with people who raised him/her: None reported How were you disciplined when you got in trouble as a child/adolescent?: None reported Does patient have siblings?: No Did patient suffer any verbal/emotional/physical/sexual abuse as a child?: No Did patient suffer from severe childhood neglect?: No Has patient ever been sexually abused/assaulted/raped as an adolescent or adult?: No Was the patient ever a victim of a crime or a disaster?: No Witnessed domestic violence?: No Has patient been affected by domestic violence as an  adult?: No  Child/Adolescent Assessment:     CCA Substance Use Alcohol/Drug Use: Alcohol / Drug Use Pain Medications: See MAR Prescriptions: See MAR Over the Counter: See MAR History of alcohol / drug use?: No history of alcohol / drug abuse                         ASAM's:  Six Dimensions of Multidimensional Assessment  Dimension 1:  Acute Intoxication and/or Withdrawal Potential:      Dimension 2:  Biomedical Conditions and Complications:      Dimension 3:  Emotional, Behavioral, or Cognitive Conditions and Complications:     Dimension 4:  Readiness to Change:     Dimension 5:  Relapse, Continued use, or Continued Problem Potential:     Dimension 6:  Recovery/Living Environment:     ASAM Severity Score:    ASAM Recommended Level of Treatment:     Substance use Disorder (SUD)    Recommendations for Services/Supports/Treatments:  Per Psyc NP Elenore PaddyJackie Thompson patient is recommended for Inpatient Hospitalization   DSM5 Diagnoses: Patient Active Problem List   Diagnosis Date Noted  . Secondary hyperparathyroidism of renal origin (HCC) 07/03/2019  . Anemia in chronic kidney disease 05/29/2019  . Benign hypertensive kidney disease with chronic kidney disease 05/29/2019  . Stage 3 chronic kidney disease (HCC) 05/29/2019  . BPH associated with nocturia 04/02/2019  . Essential hypertriglyceridemia 04/02/2019  . Gastroesophageal reflux disease without esophagitis 04/02/2019  . 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 Need for prophylactic chemotherapy 02/10/2018  . GAD (generalized anxiety disorder) 02/10/2018    Patient Centered Plan: Patient is on the following Treatment Plan(s):  Depression   Referrals to Alternative Service(s): Referred to Alternative Service(s):   Place:   Date:   Time:    Referred to Alternative Service(s):   Place:   Date:   Time:    Referred to Alternative Service(s):   Place:   Date:   Time:     Referred to Alternative Service(s):   Place:   Date:   Time:     Emonni Depasquale A Elanah Osmanovic, LCAS-A

## 2020-08-11 NOTE — Telephone Encounter (Signed)
I talked to Albert [name] yesterday and suggested he add olanzapine 5 mg back and then 10 mg at night.  If he's not feeling better and still having SI, he should go to the [facility] ER.

## 2020-08-11 NOTE — Telephone Encounter (Signed)
Received a call at 4:06 PM on 08/10/2020 from patient who reports that he has not seen a significant improvement in his signs and symptoms after taking olanzapine 5 mg.  He reports that it "had a little calming effect" and that it helped more with physical signs and symptoms but not racing thoughts.  He describes continuing to have intermittent intrusive thoughts of hurting himself or others without intent or plan and is fearful about possibly acting on these thoughts.  He denies SI or HI at time of call.  Also spoke with patient's mother, Maximillion Gill.  She shares her observations and concerns about patient and reports that he has been having racing thoughts, not able to sleep, and having intrusive thoughts about drinking again, harming himself, or harming others.  She reports that patient has also expressed to her that he is fearful of harming himself or others and has not verbalized intent or plan.  She reports that family remains with him and can continue to do so.  She reports that family can transport him to hospital if signs and symptoms worsen.  Reviewed safety plan with patient's mother.  Discussed plan for patient to take olanzapine 10 mg at bedtime, along with Seroquel 900 mg and lithium.  Discussed that olanzapine acts a mood stabilizer and may help with racing thoughts, anxiety, appetite, and insomnia in addition to mood. Discussed trying to wait until evening to take HS meds if possible. Advised pt and mother to go to ER if they have acute safety concerns. Discussed following up with office tomorrow and that information would be related to Dr. Jennelle Human and clinic nurse.

## 2020-08-11 NOTE — Telephone Encounter (Signed)
RTC  Still having HI/SI after olanzapine.  Doesn't feel stable. Intrusive thoughts of violence without desire but afraid of himself.  Not appreciably better.    Pt having mixed manic and depressive symptoms with racing thoughts and insomnia and suicidal thoughts and homicidal thoughts since starting Caplyta which was stopped last week.  The thoughts continue.  Olanzapine 15 mg was given yesterday with no appreciable benefit to the homicidal or suicidal thoughts.  He does not feel safe because he is afraid of acting on the thoughts.   Spoke with pt and his mother and recommended he go to [facility] ER bc pt does not feel safe.    They agree.  Meredith Staggers, MD, DFAPA

## 2020-08-11 NOTE — ED Notes (Signed)
Pt unable to void at this time. 

## 2020-08-11 NOTE — Telephone Encounter (Signed)
Case staffed with Dr. Jennelle Human.  Dr. Jennelle Human advised that patient could take olanzapine 5 mg now and then restart olanzapine 10 mg at bedtime tonight.  Discussed that patient should continue other current medications to include Seroquel and lithium.  Contacted patient with this information and patient verbalized understanding and reported that he had olanzapine 10 mg tablets remaining.  Discussed going to [facility] if he felt that he was not safe.  Patient reports that he remains with family that is supportive and that he is able to stay with family until symptoms improve.

## 2020-08-11 NOTE — ED Triage Notes (Signed)
Pt ambulatory to triage.  Pt reports SI and HI.  Denies drug or etoh use.  Sx for 2-3 weeks, worse past 5 days.  Pt alert.  Pt calm and cooperative.

## 2020-08-11 NOTE — Telephone Encounter (Signed)
Pt stated Albert [name] told him if he does plan on going to the ER she would call the triage nurse and let them know he is coming.He wants [place] to call them first before he goes today.

## 2020-08-11 NOTE — ED Notes (Signed)
Wallet to family

## 2020-08-11 NOTE — BH Assessment (Addendum)
[facility] BMU no beds currently available, no discharges scheduled for tomorrow 08/12/20   Referral information for Psychiatric Hospitalization faxed to;   [name] [phone]),   . [facility] [phone]),   . [facility] [phone] -or- [phone]),   . [facility] [phone]). Currently under review  . [facility] ([phone]),

## 2020-08-11 NOTE — ED Provider Notes (Signed)
[facility] Emergency Department Provider Note ____________________________________________   Event Date/Time   First MD Initiated Contact with Patient 08/11/20 1807     (approximate)  I have reviewed the triage vital signs and the nursing notes.  HISTORY  Chief Complaint Suicidal   HPI Albert [name] is a 55 y.o. malewho presents to the ED for evaluation of suicidality.  Chart review indicates history of bipolar disorder, hypothyroidism, hyper tension and HLD.  Patient is on lithium. Review of recent telephone encounter from last week with his psychiatrist.  Patient was recently started on Caplyta 3 weeks ago, but he called in reporting increased suicidal thoughts and concern for side effects of the medication.    Patient presents to the ED due to acutely worsening suicidality and homicidality over the past 1 week in the setting of this medication change.  He reports he lives alone at home.  He reports increasing thoughts of suicide, with a plan to either grab a police officers handgun to harm himself, or to force a police officer to shoot him.  Further reporting vague homicidality without further plans or particular targets.  Denies hallucinations.  Denies recent illnesses, falls or trauma, assault.  He reports chronic lower back pain at its baseline.  Denies syncope, chest pain, shortness of breath or abdominal pain.   Past Medical History:  Diagnosis Date  . Anxiety   . Bipolar disorder (HCC)   . Depression   . GERD (gastroesophageal reflux disease)     Patient Active Problem List   Diagnosis Date Noted  . Secondary hyperparathyroidism of renal origin (HCC) 07/03/2019  . Anemia in chronic kidney disease 05/29/2019  . Benign hypertensive kidney disease with chronic kidney disease 05/29/2019  . Stage 3 chronic kidney disease (HCC) 05/29/2019  . BPH associated with nocturia 04/02/2019  . Essential hypertriglyceridemia 04/02/2019  . Gastroesophageal  reflux disease without esophagitis 04/02/2019  . 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 Need for prophylactic chemotherapy 02/10/2018  . GAD (generalized anxiety disorder) 02/10/2018    Past Surgical History:  Procedure Laterality Date  . BACK SURGERY    . EYE SURGERY    . HERNIA REPAIR     Hiatal Hernia  . SPINE SURGERY      Prior to Admission medications   Medication Sig Start Date End Date Taking? Authorizing Provider  esomeprazole (NEXIUM) 20 MG capsule Take by mouth.    [provider]  fenofibrate (TRICOR) 145 MG tablet Take by mouth. 04/02/19 04/01/20  [provider]  fluticasone Aleda Grana) 50 MCG/ACT nasal spray  01/21/14   [provider]  levothyroxine (SYNTHROID, LEVOTHROID) 75 MCG tablet Take by mouth. 09/14/17   [provider]  lisinopril (ZESTRIL) 5 MG tablet Take 5 mg by mouth daily. 10/19/19   [provider]  lithium carbonate 300 MG capsule Take 2 capsules (600 mg total) by mouth daily. 04/21/20   Cottle, Steva Ready., MD  Lumateperone Tosylate (CAPLYTA) 42 MG CAPS Take 42 mg by mouth daily. 07/29/20   Cottle, Steva Ready., MD  meclizine (ANTIVERT) 25 MG tablet Take 1 tablet (25 mg total) by mouth 3 (three) times daily as needed for dizziness. 08/27/19   Cottle, Steva Ready., MD  ondansetron (ZOFRAN ODT) 4 MG disintegrating tablet Take 1 tablet (4 mg total) by mouth every 8 (eight) hours as needed for nausea or vomiting. 08/21/16   Jene Every, MD  QUEtiapine (SEROQUEL) 300 MG tablet Take  3 tablets (900 mg) by mouth at bedtime for 3 nights, then decrease to 2 tablets (600 mg) at bedtime. 08/08/20   Cottle, Steva Ready., MD  sildenafil (REVATIO) 20 MG tablet Take 5 tablets (100 mg total) by mouth as needed. 12/26/19   Sondra Come, MD  tamsulosin (FLOMAX) 0.4 MG CAPS capsule TAKE 1 CAPSULE BY MOUTH ONCE DAILY 07/18/20   Sondra Come, MD    Allergies Meloxicam, Prednisone, and  Wellbutrin [bupropion]  Family History  Problem Relation Age of Onset  . Prostate cancer Neg Hx   . Bladder Cancer Neg Hx   . Kidney cancer Neg Hx     Social History Social History   Tobacco Use  . Smoking status: Former Games developer  . Smokeless tobacco: Never Used  Vaping Use  . Vaping Use: Never used  Substance Use Topics  . Alcohol use: Not Currently  . Drug use: Never    Review of Systems  Constitutional: No fever/chills Eyes: No visual changes. ENT: No sore throat. Cardiovascular: Denies chest pain. Respiratory: Denies shortness of breath. Gastrointestinal: No abdominal pain.  No nausea, no vomiting.  No diarrhea.  No constipation. Genitourinary: Negative for dysuria. Musculoskeletal: Negative for back pain. Skin: Negative for rash. Neurological: Negative for headaches, focal weakness or numbness. Psychiatric: Positive for suicidality  ____________________________________________   PHYSICAL EXAM:  VITAL SIGNS: Vitals:   08/11/20 1744  BP: (!) 132/98  Pulse: (!) 107  Resp: 20  Temp: 99.8 F (37.7 C)  SpO2: 98%     Constitutional: Alert and oriented. Well appearing and in no acute distress. Eyes: Conjunctivae are normal. PERRL. EOMI. Head: Atraumatic. Nose: No congestion/rhinnorhea. Mouth/Throat: Mucous membranes are moist.  Oropharynx non-erythematous. Neck: No stridor. No cervical spine tenderness to palpation. Cardiovascular: Normal rate, regular rhythm. Grossly normal heart sounds.  Good peripheral circulation. Respiratory: Normal respiratory effort.  No retractions. Lungs CTAB. Gastrointestinal: Soft , nondistended, nontender to palpation. No CVA tenderness. Musculoskeletal: No lower extremity tenderness nor edema.  No joint effusions. No signs of acute trauma. Neurologic:  Normal speech and language. No gross focal neurologic deficits are appreciated. No gait instability noted. Skin:  Skin is warm, dry and intact. No rash noted. Psychiatric:  Pressured speech, but with linear thought processes.  ____________________________________________   LABS (all labs ordered are listed, but only abnormal results are displayed)  Labs Reviewed  COMPREHENSIVE METABOLIC PANEL - Abnormal; Notable for the following components:      Result Value   CO2 21 (*)    Glucose, Bld 113 (*)    Creatinine, Ser 1.74 (*)    Alkaline Phosphatase 34 (*)    GFR, Estimated 45 (*)    All other components within normal limits  SALICYLATE LEVEL - Abnormal; Notable for the following components:   Salicylate Lvl <7.0 (*)    All other components within normal limits  ACETAMINOPHEN LEVEL - Abnormal; Notable for the following components:   Acetaminophen (Tylenol), Serum <10 (*)    All other components within normal limits  CBC - Abnormal; Notable for the following components:   Platelets 474 (*)    All other components within normal limits  ETHANOL  URINE DRUG SCREEN, QUALITATIVE ([facility] ONLY)  LITHIUM LEVEL  LITHIUM LEVEL   ____________________________________________   PROCEDURES and INTERVENTIONS  Procedure(s) performed (including Critical Care):  Procedures  Medications  LORazepam (ATIVAN) tablet 2 mg (2 mg Oral Given 08/11/20 1843)    ____________________________________________   MDM / ED COURSE   55 year old male  with history of bipolar disorder presents to the ED with acutely worsening depression and with suicidal thoughts, requiring IVC and psychiatric evaluation for disposition.  Presents tachycardic and appears uncomfortable, but hemodynamically stable.  Exam with pressured speech, but with mostly linear thought processes.  He reports continued thoughts of harming himself and describes multiple well laid out plans to do so.  I see no evidence of neurologic or vascular deficits, distress or signs of medical organic pathology.  No evidence of cord compression or cauda equina syndrome from his back pain.  No signs of trauma.  We will check a  lithium level to ensure no toxicity.  We will IVC the patient due to his continued suicidal thoughts with a plan, and have psychiatry evaluate the patient for additional medication changes versus inpatient stay for stabilization.    ____________________________________________   FINAL CLINICAL IMPRESSION(S) / ED DIAGNOSES  Final diagnoses:  Suicidal thoughts     ED Discharge Orders    None       Shineka Auble Katrinka Blazing   Note:  This document was prepared using Dragon voice recognition software and may include unintentional dictation errors.   Delton Prairie, MD 08/11/20 [phone]

## 2020-08-11 NOTE — Telephone Encounter (Signed)
PT called upset that no one has called him yet. He called twice today to leave messages and has not heard back. See note from [place] on call. The message was given to Traci S but not logged as a call by staff. He is in bad shape. Olanzepine recommendation did not seem to help. He is still having homicidal and suicidal thoughts and wants to know what he should do. Should he go to the hospital or is there another recommendation? Please call back asap.

## 2020-08-11 NOTE — ED Notes (Signed)
Gray striped shirt Gray pants Black belt [employer] Cell phone Yellow gold colored ring Red plaid boxers

## 2020-08-11 NOTE — Telephone Encounter (Signed)
Telephone call with patient noted elsewhere.  Phone call to the triage nurse at [facility] to give them clinical information.

## 2020-08-11 NOTE — ED Notes (Signed)
Pt. Alert and oriented, warm and dry, in no distress. Pt. Denies HI, and AVH. Patient states having SI with out a plan at this time. Patient states it all started when he started taking a new medication his psych provider started him on 3 weeks ago. Per patient he notified provider he was going to stop taking the medication due to thoughts but even since stopping taking medication he is still having the thoughts. Patient contracts for safety with this Clinical research associate. Pt. Encouraged to let nursing staff know of any concerns or needs.

## 2020-08-12 LAB — SARS CORONAVIRUS 2 (TAT 6-24 HRS): SARS Coronavirus 2: NEGATIVE

## 2020-08-12 MED ORDER — LORAZEPAM 1 MG PO TABS
1.0000 mg | ORAL_TABLET | ORAL | Status: DC | PRN
  Administered 2020-08-12: 1 mg via ORAL
  Filled 2020-08-12: qty 1

## 2020-08-12 MED ORDER — TAMSULOSIN HCL 0.4 MG PO CAPS
0.4000 mg | ORAL_CAPSULE | Freq: Every day | ORAL | Status: DC
  Filled 2020-08-12: qty 1

## 2020-08-12 MED ORDER — LEVOTHYROXINE SODIUM 75 MCG PO TABS
75.0000 ug | ORAL_TABLET | Freq: Every day | ORAL | Status: DC
  Administered 2020-08-12: 75 ug via ORAL
  Filled 2020-08-12: qty 1

## 2020-08-12 MED ORDER — FENOFIBRATE 160 MG PO TABS
160.0000 mg | ORAL_TABLET | Freq: Every day | ORAL | Status: DC
  Filled 2020-08-12: qty 1

## 2020-08-12 MED ORDER — PANTOPRAZOLE SODIUM 40 MG PO TBEC
40.0000 mg | DELAYED_RELEASE_TABLET | Freq: Two times a day (BID) | ORAL | Status: DC
  Administered 2020-08-12: 40 mg via ORAL
  Filled 2020-08-12: qty 1

## 2020-08-12 MED ORDER — QUETIAPINE FUMARATE 25 MG PO TABS: 300.0000 mg | ORAL_TABLET | Freq: Every day | ORAL | Status: DC

## 2020-08-12 MED ORDER — LISINOPRIL 5 MG PO TABS
5.0000 mg | ORAL_TABLET | Freq: Every day | ORAL | Status: DC
  Administered 2020-08-12: 5 mg via ORAL
  Filled 2020-08-12: qty 1

## 2020-08-12 NOTE — ED Notes (Signed)
Pt's mother here to visit with patient before transport.

## 2020-08-12 NOTE — ED Notes (Signed)
Hourly rounding reveals patient in room. No complaints, stable, in no acute distress. Q15 minute rounds and monitoring via Security Cameras to continue. 

## 2020-08-12 NOTE — BH Assessment (Addendum)
TTS faxed requested information to [phone] ([facility])-vitals  TTS also faxed pt's negative COVID results at 1:58pm

## 2020-08-12 NOTE — Telephone Encounter (Signed)
Jadarius's mother(Patsy) called in today with concerns about Jaylin. She states after talking with CC Marvelous went to the ER yesterday around 5 and is still there now. She states [facility] called and they are transferring him so [facility] because they dont have any beds available at [facility]. Patsy is quite upset they they are sending him there. She would like to know if CC can talk to the staff at [facility] before they send him to [facility]. Pls call ASAP [phone]

## 2020-08-12 NOTE — Telephone Encounter (Signed)
Please review

## 2020-08-12 NOTE — ED Provider Notes (Signed)
-----------------------------------------   6:37 AM on 08/12/2020 -----------------------------------------  Patient accepted to [facility] health in [place] pending negative Covid test.   Irean Hong, MD 08/12/20 [phone]

## 2020-08-12 NOTE — ED Notes (Signed)
Pt. Transferred to BHU from ED to room 1 after screening for contraband. Report to include Situation, Background, Assessment and Recommendations from Amy RN. Pt. Oriented to unit including Q15 minute rounds as well as the security cameras for their protection. Patient is alert and oriented, warm and dry in no acute distress. Patient reported SI without a plan. He contracted for safety. Denied HI, and AVH. Pt. Encouraged to let me know if needs arise.

## 2020-08-12 NOTE — ED Notes (Signed)
Meal tray given 

## 2020-08-12 NOTE — ED Notes (Signed)
Receiving RN unable to take report at this time.  Contact number left for a return phone call.

## 2020-08-12 NOTE — ED Provider Notes (Signed)
Emergency Medicine Observation Re-evaluation Note  Albert [name] is a 55 y.o. male, seen on rounds today.  Pt initially presented to the ED for complaints of Suicidal  Currently, the patient is calm, no acute complaints.  Physical Exam  Blood pressure (!) 132/98, pulse (!) 107, temperature 99.8 F (37.7 C), temperature source Oral, resp. rate 20, height 5\' 10"  (1.778 m), weight 86.2 kg, SpO2 98 %. Physical Exam General: NAD Lungs: CTAB Psych: not agitated  ED Course / MDM  EKG:    I have reviewed the labs performed to date as well as medications administered while in observation.  Recent changes in the last 24 hours include no acute events overnight.  Accepted to [facility]  Plan  Current plan is for inpatient psychiatric care. Patient is under full IVC at this time.   , MD 08/12/20 [phone]

## 2020-08-12 NOTE — ED Notes (Signed)
VS not taken, patient asleep 

## 2020-08-12 NOTE — Telephone Encounter (Signed)
I'm sorry.  It will do no good for me to talk to the staff at the emergency room about bed availability.  I have no control over that.  The primary thing for the family to focus on is that he needs to be somewhere safe while his medication is adjusted.  The ER staff will have already tried to find a closer bed.  They do not like to send patients that far away if it can be avoided.

## 2020-08-12 NOTE — ED Notes (Signed)
Pt discharged under IVC to [facility].  VS stable. All belongings sent with officer.  Pt calm and cooperative.

## 2020-08-12 NOTE — ED Notes (Signed)
Called [facility] to request transport to [facility]

## 2020-08-12 NOTE — BH Assessment (Signed)
PATIENT BED AVAILABLE ANYTIME IN THE MORNING AFTER NEW VITALS ARE COLLECTED AND NEGATIVE COVID RESULTS HAVE RESULTED  Patient has been accepted to [facility].  Accepting physician is Dr. Jillyn Ledger.  Call report to [phone].  Representative was [employer].   ER Staff is aware of it:  [facility] ER Secretary  Dr. Dolores Frame, ER MD  [facility] Patient's Nurse     Address: [address] [place] [address]  Fax new vital signs and Negative Covid results to [phone] before transporting

## 2020-08-12 NOTE — Telephone Encounter (Signed)
Albert [name] was very upset and crying when we spoke.She could not answer my call earlier because she was with him and they took her phone.They have transferred him and she wants to know if [facility] can see your notes for him and if you would be able to see their notes as well.She wants to be reassured of that.

## 2020-08-12 NOTE — Consult Note (Signed)
[facility] Face-to-Face Psychiatry Consult   Reason for Consult: Suicidal Referring Physician: Dr. Katrinka Blazing Patient Identification: Albert [name] MRN:  [id number] Principal Diagnosis: <principal problem not specified> Diagnosis:  Active Problems:   Severe depressed bipolar I disorder without psychotic features (HCC)   GAD (generalized anxiety disorder)   Anemia in chronic kidney disease   Benign hypertensive kidney disease with chronic kidney disease   Total Time spent with patient: 30 minutes  Subjective: " I am suicidal and homicidal." Albert [name] is a 55 y.o. male patient presented to [facility] ED was initially voluntary but was placed under involuntary commitment status (IVC) by Dr. Katrinka Blazing. Per the ED triage nurse note, the pt ambulatory to triage. Pt reports SI and HI. The patient denies drug or etoh use. Sx for 2-3 weeks, worse past five days. Pt alert. Pt calm and cooperative. The patient reports being hospitalized in 2005 due to having SI in the past. The patient expressed, "I am here because I have suicidal thoughts, thinking about hurting myself and other people." "I've never done anything to myself before." The patient reports that he lives alone but has been staying with his parents due to not feeling safe at home by himself. The patient reports a lack of sleep "I have slept in 3 or 4 days" and reports an inconsistent appetite. The patient was seen face-to-face by this provider; the chart was reviewed and consulted with Dr. Katrinka Blazing on 08/11/2020 due to the patient's care. It was discussed with the EDP that the patient does meet the criteria to be admitted to the psychiatric inpatient unit.  On evaluation, the patient is alert and oriented x4, tremors, but calm and cooperative, and mood-congruent with affect. The patient does not appear to be responding to internal or external stimuli. Neither is the patient presenting with any delusional thinking. The patient denies auditory or visual hallucinations. The  patient admits to suicidal, homicidal ideations. The patient is not presenting with any psychotic or paranoid behaviors. During an encounter with the patient, he could not answer questions appropriately. Due to the patient continuing to voice suicidal ideation and homicidal ideation. The patient states, "I do not want to hurt anybody, but I keep getting these thoughts in my head, and I am focused on it."   HPI: Per Dr. Katrinka Blazing, Albert [name] is a 55 y.o. malewho presents to the ED for evaluation of suicidality. Chart review indicates history of bipolar disorder, hypothyroidism, hyper tension and HLD.  Patient is on lithium. Review of recent telephone encounter from last week with his psychiatrist.  Patient was recently started on Caplyta 3 weeks ago, but he called in reporting increased suicidal thoughts and concern for side effects of the medication.    Patient presents to the ED due to acutely worsening suicidality and homicidality over the past 1 week in the setting of this medication change.  He reports he lives alone at home.  He reports increasing thoughts of suicide, with a plan to either grab a police officers handgun to harm himself, or to force a police officer to shoot him.  Further reporting vague homicidality without further plans or particular targets.  Denies hallucinations.  Denies recent illnesses, falls or trauma, assault.  He reports chronic lower back pain at its baseline.  Denies syncope, chest pain, shortness of breath or abdominal pain.   Past Psychiatric History:  Anxiety Bipolar disorder (HCC) Depression  Risk to Self:   Risk to Others:   Prior Inpatient Therapy:  Prior Outpatient Therapy:    Past Medical History:  Past Medical History:  Diagnosis Date  . Anxiety   . Bipolar disorder (HCC)   . Depression   . GERD (gastroesophageal reflux disease)     Past Surgical History:  Procedure Laterality Date  . BACK SURGERY    . EYE SURGERY    . HERNIA REPAIR      Hiatal Hernia  . SPINE SURGERY     Family History:  Family History  Problem Relation Age of Onset  . Prostate cancer Neg Hx   . Bladder Cancer Neg Hx   . Kidney cancer Neg Hx    Family Psychiatric  History: Biological father-committed suicide Social History:  Social History   Substance and Sexual Activity  Alcohol Use Not Currently     Social History   Substance and Sexual Activity  Drug Use Never    Social History   Socioeconomic History  . Marital status: Single    Spouse name: Not on file  . Number of children: Not on file  . Years of education: Not on file  . Highest education level: Not on file  Occupational History  . Not on file  Tobacco Use  . Smoking status: Former Games developer  . Smokeless tobacco: Never Used  Vaping Use  . Vaping Use: Never used  Substance and Sexual Activity  . Alcohol use: Not Currently  . Drug use: Never  . Sexual activity: Not on file  Other Topics Concern  . Not on file  Social History Narrative  . Not on file   Social Determinants of Health   Financial Resource Strain: Not on file  Food Insecurity: Not on file  Transportation Needs: Not on file  Physical Activity: Not on file  Stress: Not on file  Social Connections: Not on file   Additional Social History:    Allergies:   Allergies  Allergen Reactions  . Meloxicam Other (See Comments)    Other reaction(s): Dizziness  . Prednisone   . Wellbutrin [Bupropion]     Labs:  Results for orders placed or performed during the hospital encounter of 08/11/20 (from the past 48 hour(s))  Urine Drug Screen, Qualitative     Status: Abnormal   Collection Time: 08/11/20  5:47 PM  Result Value Ref Range   Tricyclic, Ur Screen POSITIVE (A) NONE DETECTED   Amphetamines, Ur Screen NONE DETECTED NONE DETECTED   MDMA (Ecstasy)Ur Screen POSITIVE (A) NONE DETECTED   Cocaine Metabolite,Ur [place] NONE DETECTED NONE DETECTED   Opiate, Ur Screen NONE DETECTED NONE DETECTED   Phencyclidine (PCP)  Ur S NONE DETECTED NONE DETECTED   Cannabinoid 50 Ng, Ur  NONE DETECTED NONE DETECTED   Barbiturates, Ur Screen NONE DETECTED NONE DETECTED   Benzodiazepine, Ur Scrn NONE DETECTED NONE DETECTED   Methadone Scn, Ur NONE DETECTED NONE DETECTED    Comment: (NOTE) Tricyclics + metabolites, urine    Cutoff 1000 ng/mL Amphetamines + metabolites, urine  Cutoff 1000 ng/mL MDMA (Ecstasy), urine              Cutoff 500 ng/mL Cocaine Metabolite, urine          Cutoff 300 ng/mL Opiate + metabolites, urine        Cutoff 300 ng/mL Phencyclidine (PCP), urine         Cutoff 25 ng/mL Cannabinoid, urine                 Cutoff 50 ng/mL Barbiturates + metabolites, urine  Cutoff 200 ng/mL Benzodiazepine, urine              Cutoff 200 ng/mL Methadone, urine                   Cutoff 300 ng/mL  The urine drug screen provides only a preliminary, unconfirmed analytical test result and should not be used for non-medical purposes. Clinical consideration and professional judgment should be applied to any positive drug screen result due to possible interfering substances. A more specific alternate chemical method must be used in order to obtain a confirmed analytical result. Gas chromatography / mass spectrometry (GC/MS) is the preferred confirm atory method. Performed at [facility], [address] [place], [place] [address]   Comprehensive metabolic panel     Status: Abnormal   Collection Time: 08/11/20  5:50 PM  Result Value Ref Range   Sodium 135 135 - 145 mmol/L   Potassium 4.0 3.5 - 5.1 mmol/L   Chloride 104 98 - 111 mmol/L   CO2 21 (L) 22 - 32 mmol/L   Glucose, Bld 113 (H) 70 - 99 mg/dL    Comment: Glucose reference range applies only to samples taken after fasting for at least 8 hours.   BUN 18 6 - 20 mg/dL   Creatinine, Ser 6.041.74 (H) 0.61 - 1.24 mg/dL   Calcium 9.9 8.9 - 54.010.3 mg/dL   Total Protein 7.8 6.5 - 8.1 g/dL   Albumin 4.7 3.5 - 5.0 g/dL   AST 23 15 - 41 U/L   ALT 16 0 - 44  U/L   Alkaline Phosphatase 34 (L) 38 - 126 U/L   Total Bilirubin 0.5 0.3 - 1.2 mg/dL   GFR, Estimated 45 (L) >60 mL/min    Comment: (NOTE) Calculated using the CKD-EPI Creatinine Equation (2021)    Anion gap 10 5 - 15    Comment: Performed at [facility], [address] [place], [place] [address]  Ethanol     Status: None   Collection Time: 08/11/20  5:50 PM  Result Value Ref Range   Alcohol, Ethyl (B) <10 <10 mg/dL    Comment: (NOTE) Lowest detectable limit for serum alcohol is 10 mg/dL.  For medical purposes only. Performed at [facility], [address] [place], [place] [address]   Salicylate level     Status: Abnormal   Collection Time: 08/11/20  5:50 PM  Result Value Ref Range   Salicylate Lvl <7.0 (L) 7.0 - 30.0 mg/dL    Comment: Performed at [facility], [address] [place], [place] [address]  Acetaminophen level     Status: Abnormal   Collection Time: 08/11/20  5:50 PM  Result Value Ref Range   Acetaminophen (Tylenol), Serum <10 (L) 10 - 30 ug/mL    Comment: (NOTE) Therapeutic concentrations vary significantly. A range of 10-30 ug/mL  may be an effective concentration for many patients. However, some  are best treated at concentrations outside of this range. Acetaminophen concentrations >150 ug/mL at 4 hours after ingestion  and >50 ug/mL at 12 hours after ingestion are often associated with  toxic reactions.  Performed at [facility], [address] [place], [place] [address]   cbc     Status: Abnormal   Collection Time: 08/11/20  5:50 PM  Result Value Ref Range   WBC 9.3 4.0 - 10.5 K/uL   RBC 4.40 4.22 - 5.81 MIL/uL   Hemoglobin 13.9 13.0 - 17.0 g/dL   HCT 42.1  39.0 - 52.0 %   MCV 95.7 80.0 - 100.0 fL   MCH 31.6 26.0 - 34.0 pg   MCHC 33.0 30.0 - 36.0 g/dL   RDW 96.0 45.4 - 09.8 %   Platelets 474 (H) 150 - 400 K/uL   nRBC 0.0 0.0 - 0.2 %    Comment: Performed at [facility], [address] [place], [place] [address]  Lithium level     Status: None   Collection Time: 08/11/20  6:25 PM  Result Value Ref Range   Lithium Lvl 0.95 0.60 - 1.20 mmol/L    Comment: Performed at [facility], [address] [place], [place] [address]    Current Facility-Administered Medications  Medication Dose Route Frequency Provider Last Rate Last Admin  . QUEtiapine (SEROQUEL) tablet 900 mg  900 mg Oral QHS Gillermo Murdoch, NP   900 mg at 08/12/20 0018   Current Outpatient Medications  Medication Sig Dispense Refill  . esomeprazole (NEXIUM) 40 MG capsule Take 40 mg by mouth every morning.    . fenofibrate (TRICOR) 145 MG tablet Take 145 mg by mouth daily.    [name] levothyroxine (SYNTHROID, LEVOTHROID) 75 MCG tablet Take by mouth.    [name] lisinopril (ZESTRIL) 5 MG tablet Take 5 mg by mouth daily.    [name] lithium carbonate 300 MG capsule Take 2 capsules (600 mg total) by mouth daily. 180 capsule 1  . Lumateperone Tosylate (CAPLYTA) 42 MG CAPS Take 42 mg by mouth daily. 30 capsule 1  . QUEtiapine (SEROQUEL) 300 MG tablet Take 3 tablets (900 mg) by mouth at bedtime for 3 nights, then decrease to 2 tablets (600 mg) at bedtime. 63 tablet 0  . tamsulosin (FLOMAX) 0.4 MG CAPS capsule TAKE 1 CAPSULE BY MOUTH ONCE DAILY 90 capsule 3    Musculoskeletal: Strength & Muscle Tone: within normal limits Gait & Station: normal Patient leans: N/A  Psychiatric Specialty Exam:  Presentation  General Appearance: Bizarre  Eye Contact:Good  Speech:Blocked  Speech Volume:Normal  Handedness:Right   Mood and Affect  Mood:Anxious  Affect:Depressed; Congruent   Thought Process  Thought Processes:Coherent  Descriptions of Associations:Intact  Orientation:Full (Time, Place and Person)  Thought Content:Logical  History of Schizophrenia/Schizoaffective disorder:No  Duration of Psychotic Symptoms:No data recorded Hallucinations:Hallucinations: None  Ideas of Reference:None  Suicidal  Thoughts:Suicidal Thoughts: Yes, Active SI Active Intent and/or Plan: Without Intent; Without Plan; Without Means to Carry Out  Homicidal Thoughts:Homicidal Thoughts: Yes, Active HI Active Intent and/or Plan: Without Intent; Without Plan; Without Means to Carry Out   Sensorium  Memory:Immediate Good; Recent Good; Remote Good  Judgment:Poor  Insight:Poor   Executive Functions  Concentration:Good  Attention Span:Good  Recall:Good  Fund of Knowledge:Good  Language:Good   Psychomotor Activity  Psychomotor Activity:Psychomotor Activity: Normal   Assets  Assets:Communication Skills; Desire for Improvement; Social Support; Resilience   Sleep  Sleep:Sleep: Poor   Physical Exam: Physical Exam Vitals and nursing note reviewed.  Constitutional:      Appearance: Normal appearance. He is obese.  HENT:     Nose: Nose normal.     Mouth/Throat:     Mouth: Mucous membranes are moist.  Cardiovascular:     Rate and Rhythm: Tachycardia present.  Pulmonary:     Effort: Pulmonary effort is normal.  Musculoskeletal:        General: Normal range of motion.     Cervical back: Normal range of motion and neck supple.  Neurological:     Mental Status: He is  alert and oriented to person, place, and time.  Psychiatric:        Attention and Perception: Attention and perception normal.        Mood and Affect: Mood is anxious and depressed. Affect is inappropriate.        Behavior: Behavior is cooperative.        Thought Content: Thought content includes homicidal and suicidal ideation.        Cognition and Memory: Cognition and memory normal.        Judgment: Judgment is impulsive and inappropriate.    Review of Systems  Psychiatric/Behavioral: Positive for depression, substance abuse and suicidal ideas. The patient is nervous/anxious and has insomnia.   All other systems reviewed and are negative.  Blood pressure (!) 132/98, pulse (!) 107, temperature 99.8 F (37.7 C),  temperature source Oral, resp. rate 20, height 5\' 10"  (1.778 m), weight 86.2 kg, SpO2 98 %. Body mass index is 27.26 kg/m.  Treatment Plan Summary: Medication management and Plan The patient is a safety risk to himself and requires psychiatric inpatient admission for stabilization and treatment.  Disposition: Recommend psychiatric Inpatient admission when medically cleared. Supportive therapy provided about ongoing stressors.  , NP 08/12/2020 12:28 AM

## 2020-08-12 NOTE — ED Notes (Signed)
Pt speaking with his mother on the phone.

## 2020-08-13 ENCOUNTER — Telehealth: Payer: Self-pay | Admitting: Psychiatry

## 2020-08-13 NOTE — Telephone Encounter (Signed)
Diogo's mom, Patsy left a message after hours and said she was returning someone's call. I'm sending the message to yall first. Giacomo is at the [facility] ER as of last night.

## 2020-08-13 NOTE — Telephone Encounter (Signed)
Did one of yall try to call her?I spoke to her yesterday already

## 2020-08-13 NOTE — Telephone Encounter (Signed)
I've not tried to call her.

## 2020-08-13 NOTE — Telephone Encounter (Signed)
Please inform her that I spoke with the psychiatric team treating Albert [name] at the hospital.  I was able to give them all the information they needed to get him treated and back home.

## 2020-08-19 ENCOUNTER — Telehealth: Payer: Self-pay | Admitting: Psychiatry

## 2020-08-19 NOTE — Telephone Encounter (Signed)
Pt's dad Alinda Money left message concerning Albert [name] in [facility] and care receiving. Need advise on what can be done about possible transfer. Contact # [phone].

## 2020-08-19 NOTE — Telephone Encounter (Signed)
Please review.Should I call him?

## 2020-08-19 NOTE — Telephone Encounter (Signed)
I Spoke to his father,Tony.He stated he is not getting much help in [place] due to the amount of patients that are also there.He is concerned because he is seeing a Publishing rights manager instead of a doctor.He said they put [place] back on his old medication ,and he is attending "useless group sessions".Alinda Money believes he needs to be transferred somewhere else.He would like for Dr Jennelle Human to give him a call back with recommendations. [phone]

## 2020-08-19 NOTE — Telephone Encounter (Signed)
RTC Father Alinda Money  Disc phone call with staff at the hospital including the nurse practioner who is caring for him.  They have restarted olanzapine as recommended by this MD as well as stopping the Caplyta.   Father is disappointed with care at the facility at the hospital. Disc difference between involuntary and voluntary hosp. Should not leave if having serious SI. Rec against transfer facilities bc likely to slow his progress.

## 2020-08-27 ENCOUNTER — Encounter: Payer: Self-pay | Admitting: Psychiatry

## 2020-08-27 ENCOUNTER — Ambulatory Visit (INDEPENDENT_AMBULATORY_CARE_PROVIDER_SITE_OTHER): Payer: Medicare PPO | Admitting: Psychiatry

## 2020-08-27 ENCOUNTER — Other Ambulatory Visit: Payer: Self-pay

## 2020-08-27 DIAGNOSIS — R6889 Other general symptoms and signs: Secondary | ICD-10-CM

## 2020-08-27 DIAGNOSIS — F4001 Agoraphobia with panic disorder: Secondary | ICD-10-CM

## 2020-08-27 DIAGNOSIS — Z79899 Other long term (current) drug therapy: Secondary | ICD-10-CM

## 2020-08-27 DIAGNOSIS — F5105 Insomnia due to other mental disorder: Secondary | ICD-10-CM

## 2020-08-27 DIAGNOSIS — F314 Bipolar disorder, current episode depressed, severe, without psychotic features: Secondary | ICD-10-CM

## 2020-08-27 DIAGNOSIS — F411 Generalized anxiety disorder: Secondary | ICD-10-CM | POA: Diagnosis not present

## 2020-08-27 MED ORDER — LORAZEPAM 1 MG PO TABS: 1.0000 mg | ORAL_TABLET | Freq: Three times a day (TID) | ORAL | 1 refills | Status: DC

## 2020-08-27 NOTE — Progress Notes (Signed)
Albert [name] [id number] 05-27-1965 55 y.o.    Subjective:   Patient ID:  Albert [name] is a 55 y.o. (DOB 1965-11-25) male.  Chief Complaint:  Chief Complaint  Patient presents with  . Follow-up  . Severe bipolar I disorder, current or most recent episode d  . Manic Behavior  . Depression  . Medication Problem    Depression        Associated symptoms include decreased concentration.  Associated symptoms include no headaches and no suicidal ideas.  Past medical history includes anxiety.   Anxiety Symptoms include decreased concentration and nervous/anxious behavior. Patient reports no confusion, dizziness, palpitations, shortness of breath or suicidal ideas.      Albert [name] presents to the office today for follow-up of mixed bipolar, anxiety and still grieving stress of breakup.  He requires frequent follow-up because of long-term symptoms.  At visit October 25, 2018.  We made several medicine changes because of his concerns about sleep and other issues.  We increased olanzapine back to 7.5 mg bc not sleeping as well at 5 mg.  He has to have the prescription for quetiapine written for up to 3 tablets at night in case insomnia is worse because insomnia makes his mood disorder so much worse.  We discussed that was above the usual max but the request was granted given his treatment resistant status. We also reduce lithium from 900 mg daily to 750 mg daily to try to reduce tremor and muscle twitches.  At  visit March 28, 2019.  Fluoxetine was increased to 40 mg daily.   Early January 2021 increased to 60 mg daily.  No SE.  seen June 28, 2019.  No further meds were changed except olanzapine was increased back to 10 mg daily to see if if he could get additional benefit per his request.  Covid vaccinated.  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  Then had cholecystectomy.  April 2021 appt with the following noted: 2 episodes night sweats lately.  Memory has been very bad lately.  Repeatedly asks  mother questions. Still  tend to stay in his room and his bed.  Still has rapid cycling mood swings.  Maybe some better with increase in olanzapine to 10 and tolerating itl.  Would like to get out more but can't DT Covid.  Can perform necessary chores.  Will get out of the house when he can.  No change in death thoughts and anxiety in intensity but is better with frequency.  Usually comes and goes in waves but more persistent.  Consistent with meds.  Ativan not helping anxiety very dramatically but he's not sure.  Fidgety.  No trigger other than still grieving relationship and can't get it out of his head.  Poor energy, concentration and more forgetful. In bed more and less active.  Sleep ok lately which is unusual.  Can concentrate on financial matters and stock market at times.  Tolerating meds.  Still some intrusive SI without reason. Less frequent obsessive thoughts about broken relationship and has been doing this for months.  Can't let it go.  Loop.   No unusual stress even with the family who is supportive. Likes the benefit that Zyprexa gives. No sleepwalking nor falling nor odd behavior.  No history of sleep walking.  He understands this may recur with an increase.  Also wants option to rarely take extra quetiapine 300 for sleep prn. Plan:  Try to reduce lorazepam if possible.  10/22/2019 appointment with  the following noted: Continues fluoxetine 60, lithium 600 mg, lorazepam 2 mg AM and HS and 1 mg midday, olanzapine 10, quetiapine 600 mg HS. Still anxious chronically and including driving in crowded spaces.  Doesn't thing Ativan helps as well as Xanax but less cognitive problems. Sleep is not as good.  Occ EFA.  More EMA and wanting to do things in the middle of the night but this is not typical.  Wonders about why that happens.  Some napping.   Tolerating meds.  Asks about weight loss meds.  Disc this in detail.   Plan no changes except OK meclizine prn vertigo.  02/11/20 appt with the  following noted: Able to gradually reduce lorazepam to 1 mg AM and HS. More depressed over time and less interested in things and less interest in going out but does with his parents. Has reduced from 800 to 600 mg HS with some awakening but usually able to go back to sleep.Albert [name]  Spending a good amount of time in bed bc watches TV in bedroom.  Parents watch TV in different part of the house.  No mood swings he notices.   Concerns about weight gain about 200#. Likes olanzapine's benefit for sleep. Plan: Trial for TRD to  Increase fluoxetine to 80 mg daily  to use the combo with olanzapine for TR bipolar depression.   04/21/20 appt with following noted: I thought in beginning some benefit with fluoxetine.  Lifelong negative thinking continues.   Not much bipolar.  Reads a lot on bipolar.   Still tired and anxious a little more may be seasonal.  No familial stressors.  Tends to lay down in afternoon.  Anxiety not over anything in particular.   No SE with fluoxetine. Took meclizine prn.  Easily motion sick.    Asks questions about newer drugs for bipolar like Caplyta. Plan:  No med changes  06/30/20 appt noted: Tired of wearing masks.  Vaccinated.  Asked questions about when this will end with mask mandate.  Mood up and down some since here and getting up 2-3 times.  Disc awakening problems.  Trying to do better bc night eating some.   Mood is about average today so far.  [name] out with friends for breakfast.  Recognizes activity helps mood.   3 days in a row napped a lot in the afternoon.   Bed is a comfort place.  Gets bored and hard to motivate.  Tries to stay away from night eating and spending.   More depressed when alone and wonders about med changes. Plan: Failed response to olanzapine + fluoxetine for TR bipolar depression.   Reduce fluoxetine to 1 daily and reduce olanzapine to one half nightly for 10 days and then stop it. Then start Caplyta 1 daily  08/27/2020 appointment with the  following noted: Patient decompensated with the above switch to Caplyta with intrusive suicidal thoughts and had to be hospitalized for psychiatric reasons.  Multiple phone calls with family members since that time.  Spoke with the psychiatric nurse practitioner with the decision to restart olanzapine in place of the Caplyta given the patient was more stable while on the combination of Seroquel and olanzapine than with the Caplyta.  Caplyta triggered HI/SI and depressed and confused on it, and agitated and still has some of it now. No akathisia. Frustrated with lack of therapy at the hospital.   Hydroxyzine didn't help jitteriness.  Feels some better.  Fleeting SI & HI but not obsessive like it was  prior to hospitalization.  Feels a little amped up and nervous.  Scared of what could happen.  Apprehensive being here talking about things. 5-6 hours sleep last night and would like to have more. At mom and dad's house right now and up more in the day. Inadvertently stopped lorazepam abruptly by mistake likely contributing to shakiness.  Still some memory issues.  Trying to stay out of bed when watching TV.  Some awakening but managing. Occ fleeting SI and distracts himself.   Always spends a lot of time just laying around.   Can have anxiety for no reason like coming here, and varies in intensity without pattern.      Less panic than in the past.  Some chronic depression and hyperactivity and loudness and hyperverbal.  Usually back to sleep.  Total 6 hours but some napping, awakens 2-4 times nightly but back to sleep..  Taking quetiapine 600 mg HS.  still lacks interest and motivation.  Can follow a TV show if interested.  Psych med hx extensive including ECT and  risperidone, lithium 1200 SE, Zyprexa 20-15 akathisia, Latuda 80 which caused akathisia, Abilify, Vraylar, Rexulti, aripiprazole 20 mg with akathisia, Seroquel 1000 mg,  InVega, Geodon, Saphris with side effects, symbyax, Fanapt NR . lamotrigine  300 mg, Depakote 2000 mg, Tegretol,Trileptal and several of these in combinations, gabapentin, N-acetylcysteine, Nuedexta,    Belsomra with no response,  Lunesta no response, trazodone 200 mg, Xanax, clonazepam, lorazepam less sedation. clonidine, Viibryd 40 mg for 3 months with diarrhea, protriptyline with side effects, Trintellix 20 mg, Parnate 50 mg with no response,  imipramine, venlafaxine,  Emsam 12 mg for 2 months,  bupropion was side effects,  Lexapro 20 mg, sertraline, paroxetine, Deplin, fluoxetine 80 methylphenidate 60 mg,  Vyvanse, Concerta, strattera, , modafinil,  pramipexole,  amantadine , Patient prone to akathisia.   Review of Systems:  Review of Systems  Respiratory: Negative for shortness of breath.   Cardiovascular: Negative for palpitations.  Musculoskeletal: Positive for back pain.  Neurological: Positive for tremors. Negative for dizziness, weakness and headaches.       Fidgety  Psychiatric/Behavioral: Positive for decreased concentration and depression. Negative for agitation, behavioral problems, confusion, dysphoric mood, hallucinations, self-injury, sleep disturbance and suicidal ideas. The patient is nervous/anxious and is hyperactive.     Medications: I have reviewed the patient's current medications.  Current Outpatient Medications  Medication Sig Dispense Refill  . esomeprazole (NEXIUM) 40 MG capsule Take 40 mg by mouth every morning.    Albert [name]. FLUoxetine (PROZAC) 40 MG capsule Take 40 mg by mouth daily.    Albert [name]. levothyroxine (SYNTHROID, LEVOTHROID) 75 MCG tablet Take by mouth.    Albert [name]. lisinopril (ZESTRIL) 5 MG tablet Take 5 mg by mouth daily.    Albert [name]. lithium carbonate 300 MG capsule Take 2 capsules (600 mg total) by mouth daily. 180 capsule 1  . LORazepam (ATIVAN) 1 MG tablet Take 1 tablet (1 mg total) by mouth every 8 (eight) hours. 90 tablet 1  . OLANZapine (ZYPREXA) 10 MG tablet Take 10 mg by mouth at bedtime.    Albert [name]. QUEtiapine (SEROQUEL) 300 MG tablet Take 3 tablets  (900 mg) by mouth at bedtime for 3 nights, then decrease to 2 tablets (600 mg) at bedtime. 63 tablet 0  . tamsulosin (FLOMAX) 0.4 MG CAPS capsule TAKE 1 CAPSULE BY MOUTH ONCE DAILY 90 capsule 3  . fenofibrate (TRICOR) 145 MG tablet Take 145 mg by mouth daily.     No current facility-administered medications  for this visit.   Medication Side Effects: Other: mild sleepiness.  Occ twitches.\, tremor  Allergies:  Allergies  Allergen Reactions  . Meloxicam Other (See Comments)    Other reaction(s): Dizziness  . Prednisone   . Wellbutrin [Bupropion]     Past Medical History:  Diagnosis Date  . Anxiety   . Bipolar disorder (HCC)   . Depression   . GERD (gastroesophageal reflux disease)     Family History  Problem Relation Age of Onset  . Prostate cancer Neg Hx   . Bladder Cancer Neg Hx   . Kidney cancer Neg Hx     Social History   Socioeconomic History  . Marital status: Single    Spouse name: Not on file  . Number of children: Not on file  . Years of education: Not on file  . Highest education level: Not on file  Occupational History  . Not on file  Tobacco Use  . Smoking status: Former Games developer  . Smokeless tobacco: Never Used  Vaping Use  . Vaping Use: Never used  Substance and Sexual Activity  . Alcohol use: Not Currently  . Drug use: Never  . Sexual activity: Not on file  Other Topics Concern  . Not on file  Social History Narrative  . Not on file   Social Determinants of Health   Financial Resource Strain: Not on file  Food Insecurity: Not on file  Transportation Needs: Not on file  Physical Activity: Not on file  Stress: Not on file  Social Connections: Not on file  Intimate Partner Violence: Not on file    Past Medical History, Surgical history, Social history, and Family history were reviewed and updated as appropriate.   Please see review of systems for further details on the patient's review from today.   Objective:   Physical Exam:  There  were no vitals taken for this visit.  Physical Exam Constitutional:      General: He is not in acute distress.    Appearance: He is well-developed.  Musculoskeletal:        General: No deformity.  Neurological:     Mental Status: He is alert and oriented to person, place, and time.     Cranial Nerves: No dysarthria.     Motor: Tremor present.     Coordination: Coordination normal.     Comments: Slight tremor  Psychiatric:        Attention and Perception: Attention and perception normal. He does not perceive auditory or visual hallucinations.        Mood and Affect: Mood is anxious and depressed. Affect is not labile, blunt, angry, tearful or inappropriate.        Speech: Speech normal. Speech is not slurred.        Behavior: Behavior normal. Behavior is not slowed. Behavior is cooperative.        Thought Content: Thought content is not paranoid or delusional. Thought content includes homicidal and suicidal ideation. Thought content does not include homicidal or suicidal plan.        Cognition and Memory: Cognition normal. He exhibits impaired recent memory.        Judgment: Judgment normal.     Comments: Insight fair Memory is better than it was but still complains He is chronically anxious   But worse. No manic episodes noted. Less fidgety than usual. Some wordfinding problems ongoing. No sui or homocidal intent or plan     Lab Review:     Component  Value Date/Time   NA 135 08/11/2020 1750   NA 142 03/20/2014 0514   K 4.0 08/11/2020 1750   K 4.1 03/20/2014 0514   CL 104 08/11/2020 1750   CL 112 (H) 03/20/2014 0514   CO2 21 (L) 08/11/2020 1750   CO2 26 03/20/2014 0514   GLUCOSE 113 (H) 08/11/2020 1750   GLUCOSE 99 03/20/2014 0514   BUN 18 08/11/2020 1750   BUN 7 03/20/2014 0514   CREATININE 1.74 (H) 08/11/2020 1750   CREATININE 0.78 03/20/2014 0514   CALCIUM 9.9 08/11/2020 1750   CALCIUM 9.5 03/20/2014 0514   PROT 7.8 08/11/2020 1750   PROT 7.3 03/18/2014 1459    ALBUMIN 4.7 08/11/2020 1750   ALBUMIN 3.2 (L) 03/18/2014 1459   AST 23 08/11/2020 1750   AST 16 03/18/2014 1459   ALT 16 08/11/2020 1750   ALT 16 03/18/2014 1459   ALKPHOS 34 (L) 08/11/2020 1750   ALKPHOS 105 03/18/2014 1459   BILITOT 0.5 08/11/2020 1750   BILITOT 0.3 03/18/2014 1459   GFRNONAA 45 (L) 08/11/2020 1750   GFRNONAA >60 03/20/2014 0514   GFRAA 54 (L) 04/18/2019 1612   GFRAA >60 03/20/2014 0514       Component Value Date/Time   WBC 9.3 08/11/2020 1750   RBC 4.40 08/11/2020 1750   HGB 13.9 08/11/2020 1750   HGB 12.6 (L) 03/20/2014 0514   HCT 42.1 08/11/2020 1750   HCT 38.4 (L) 03/20/2014 0514   PLT 474 (H) 08/11/2020 1750   PLT 346 03/20/2014 0514   MCV 95.7 08/11/2020 1750   MCV 89 03/20/2014 0514   MCH 31.6 08/11/2020 1750   MCHC 33.0 08/11/2020 1750   RDW 12.2 08/11/2020 1750   RDW 12.5 03/20/2014 0514   LYMPHSABS 3.2 03/20/2014 0514   MONOABS 1.0 03/20/2014 0514   EOSABS 0.4 03/20/2014 0514   BASOSABS 0.1 03/20/2014 0514    Lithium Lvl  Date Value Ref Range Status  08/11/2020 0.95 0.60 - 1.20 mmol/L Final    Comment:    Performed at [facility], [address], [place], [place] [address]    Last lithium level Sept 0.8.   Last lithium level July 27, 2018 was normal at 1.0.   Lithium level [facility] October 03, 2018 = 1.2. Said he got lithium level at [facility] as requested.  Labs not in Epic.  Recent lipids ok except higher TG than usual.  Normal A1C.  No results found for: PHENYTOIN, PHENOBARB, VALPROATE, CBMZ   .res Assessment: Plan:    Raife was seen today for follow-up, severe bipolar i disorder, current or most recent episode d, manic behavior, depression and medication problem.  Diagnoses and all orders for this visit:  Severe bipolar I disorder, current or most recent episode depressed (HCC)  Generalized anxiety disorder -     LORazepam (ATIVAN) 1 MG tablet; Take 1 tablet (1 mg total) by mouth every 8 (eight) hours.  Panic  disorder with agoraphobia -     LORazepam (ATIVAN) 1 MG tablet; Take 1 tablet (1 mg total) by mouth every 8 (eight) hours.  Insomnia due to mental condition  Forgetfulness  Lithium use    Chronic TR bipolar mixed and chronic anxiety.  He usually has mixed bipolar symptoms which we have not been able to completely eliminate.   Not much subjective change since here.  He is having less hyperactive or manic symptoms.  See long list of meds tried.   He is somewhat chronically unstable and recently  has been worse because of problems related to benzodiazepines specifically Xanax from which she was having side effects those have resolved.Albert [name]  He is cognition does seem better with the switch from alprazolam to lorazepam, per the patient and his mother but is gradually getting worse again..   We discussed the short-term risks associated with benzodiazepines including sedation and increased fall risk among others.  Discussed long-term side effect risk including dependence, potential withdrawal symptoms, and the potential eventual dose-related risk of dementia. Disc newer studies refute dementia risks.  Unfortunately due to the severity of set his symptoms polypharmacy is a necessity.  Disc unusual combo antipsychotics but it helps more than other options.  Discussed potential metabolic side effects associated with atypical antipsychotics, as well as potential risk for movement side effects. Advised pt to contact office if movement side effects occur.  He had some falling around the time when he previously took olanzapine but he took it for several months at this dosage before he had any side effect issues and he was on Xanax at the time that the following occurred. Take LED Seroquel to sleep.   No obvious alternatives for sleep  Discussed safety plan at length with patient.  Advised patient to contact office with any worsening signs and symptoms.  Instructed patient to go to the [facility] emergency room  for evaluation if experiencing any acute safety concerns, to include suicidal intent. "I don't want to do it" .  Call if there is any worsening.   Lithium being used bc chronic SI and death thoughts.   Counseled patient regarding potential benefits, risks, and side effects of lithium to include potential risk of lithium affecting thyroid and renal function.  Discussed need for periodic lab monitoring to determine drug level and to assess for potential adverse effects.  Counseled patient regarding signs and symptoms of lithium toxicity and advised that they notify office immediately or seek urgent medical attention if experiencing these signs and symptoms.  Patient advised to contact office with any questions or concerns. Continue lithium to 2 of the 300 mg capsules. Lithium level 0.8.  On 750 mg daily.   Call if death thoughts worsen or worsening SI. Prefer only 1 med change at a time. If nothing worse will decrease lithium again DT kidney concerns  Restart lorazepam 1 mg in the morning, 1 mg in the afternoon and 1 mg at night for anxiety  Stop hydroxyzine   Increase olanzapine to 1 and 1/2 of 10 mg tablets in evening Disc SE. Disc combo with Seroquel may lower response rate but he doesn't think he can sleep without Seroquel.  We discussed the short-term risks associated with benzodiazepines including sedation and increased fall risk among others.  Discussed long-term side effect risk including dependence, potential withdrawal symptoms, and the potential eventual dose-related risk of dementia.  But recent studies from 2020 dispute this association between benzodiazepines and dementia risk. Newer studies in 2020 do not support an association with dementia.  Had reduced Quetiapine 600 mg at night for sleep.    Discussed safety plan at length with patient.  Advised patient to contact office with any worsening signs and symptoms.  Instructed patient to go to the [facility] emergency room for  evaluation if experiencing any acute safety concerns, to include suicidal intent.  Disc Ozempic for weight loss.  Has had weight gain from meds as a contributor.  This appt was 30 mins.  FU 2 weeks   Meredith Staggers, MD, DFAPA  Future Appointments  Date Time Provider Department Center  09/03/2020  9:30 AM [id number]  10/06/2020  9:00 AM Leafy Ro, MD [facility] None  12/25/2020  1:30 PM Sondra Come, MD [facility] None    No orders of the defined types were placed in this encounter.     -------------------------------

## 2020-08-27 NOTE — Patient Instructions (Addendum)
Restart lorazepam 1 mg in the morning, 1 mg in the afternoon and 1 mg at night for anxiety  Stop hydroxyzine   Increase olanzapine to 1 and 1/2 of 10 mg tablets in evening.  Call if it causes akathisia

## 2020-09-03 ENCOUNTER — Other Ambulatory Visit: Payer: Self-pay

## 2020-09-03 ENCOUNTER — Ambulatory Visit
Admission: RE | Admit: 2020-09-03 | Discharge: 2020-09-03 | Disposition: A | Discharge status: Home / Self Care | Payer: Medicare PPO | Source: Ambulatory Visit | Attending: Surgery | Admitting: Surgery

## 2020-09-03 ENCOUNTER — Other Ambulatory Visit: Payer: Self-pay | Admitting: Surgery

## 2020-09-03 ENCOUNTER — Other Ambulatory Visit: Payer: Self-pay | Admitting: General Surgery

## 2020-09-03 DIAGNOSIS — K449 Diaphragmatic hernia without obstruction or gangrene: Secondary | ICD-10-CM | POA: Diagnosis not present

## 2020-09-03 NOTE — Progress Notes (Signed)
Subjective:     Patient ID: Albert [name] is a 55 y.o. male.  HPI  The following portions of the patient's history were reviewed and updated as appropriate.  This an established patient is here today for: office visit. He is here to discuss having a upper endoscopy, last by Dr Mechele Collin in 2016. He states Dr Everlene Farrier is planning on doing an umbilical hernia repair but wanted this done prior. He is here with his mother, [name].  The patient reports dysphagia with dry chicken (baked versus fried), meat, breads.  No difficulty with rice or carrots.  Surgery being considered for repeat fundoplication.  Longstanding constipation. Review of Systems  Constitutional: Negative for chills and fever.  Respiratory: Negative for cough.        Chief Complaint  Patient presents with  . Pre-op Exam     BP 114/84   Pulse 98   Temp 36.7 C (98.1 F)   Ht 175.3 cm (5\' 9" )   Wt 89.8 kg (198 lb)   SpO2 95%   BMI 29.24 kg/m       Past Medical History:  Diagnosis Date  . Benign enlargement of prostate 2008  . Bipolar 2 disorder, major depressive episode (CMS-HCC)   . Constipation 05/19/2017  . Degenerative disk disease   . Depression   . Erectile dysfunction   . GERD (gastroesophageal reflux disease)   . Graves disease 10/2013   I-131 ablation 04/2015  . Hiatal hernia   . History of amblyopia   . History of iron deficiency anemia   . Hypothyroidism, postablative   . Irritable bowel syndrome   . PONV (postoperative nausea and vomiting)           Past Surgical History:  Procedure Laterality Date  . back surgery    . COLONOSCOPY  04/17/2002   Normal: CBF 01/16/2014  . COLONOSCOPY  07/12/2014   Int Hemorrhoids, Diverticulosis: CBF 07/2024  . deviated septum    . EGD  04/17/2002, 12/01/2000, 02/28/1997  . EGD  07/12/2014   No repeat per RTE (dw)  . HERNIA REPAIR    . Nissen fundoplication    . Strabismus surgery in January 2006          Social History          Socioeconomic History  . Marital status: Single  Tobacco Use  . Smoking status: Former Smoker    Types: Cigarettes    Quit date: 11/23/2001    Years since quitting: 18.7  . Smokeless tobacco: Former 11/25/2001  . Vaping Use: Never used  Substance and Sexual Activity  . Alcohol use: No    Alcohol/week: 0.0 standard drinks  . Drug use: No  . Sexual activity: Yes    Partners: Female            Allergies  Allergen Reactions  . Mobic [Meloxicam] Dizziness  . Nsaids (Non-Steroidal Anti-Inflammatory Drug) Hallucination  . Prednisone Other (See Comments)    Other Reaction: hyper  . Wellbutrin [Bupropion Hcl] Other (See Comments)    Current Medications        Current Outpatient Medications  Medication Sig Dispense Refill  . acetaminophen (TYLENOL) 500 MG tablet Take by mouth every 6 (six) hours as needed    . chlorhexidine (PERIDEX) 0.12 % solution once daily    . esomeprazole (NEXIUM) 40 MG DR capsule TAKE 1 CAPSULE BY MOUTH DAILY. TAKE 15-20 MINUTES BEFORE BREAKFAST. 90 capsule 3  . fenofibrate nanocrystallized (TRICOR) 145 MG  tablet TAKE ONE TABLET BY MOUTH ONCE DAILY 90 tablet 1  . FLUoxetine (PROZAC) 20 MG capsule Take 40 mg by mouth once daily    . fluticasone (FLONASE) 50 mcg/actuation nasal spray     . levothyroxine (SYNTHROID) 75 MCG tablet Take 1 tablet (75 mcg total) by mouth every morning before breakfast (0630) ON EMPTY STOMACH WITH A GLASS OF WATER AT LEAST 30-60 MIN BEFORE BREAKFAST 90 tablet 1  . lisinopriL (ZESTRIL) 5 MG tablet Take 5 mg by mouth once daily    . lithium carbonate 150 MG capsule Take 150 mg by mouth nightly    . lithium carbonate 300 MG capsule Take 600 mg by mouth nightly    . LORazepam (ATIVAN) 2 MG tablet Take 2 mg by mouth 4 (four) times daily       . meclizine (ANTIVERT) 25 mg tablet Take 25 mg by mouth 3 (three) times daily as needed    . OLANZapine (ZYPREXA) 10 MG  tablet Take 10 mg by mouth nightly       . pseudoephedrine (SUDAFED) 60 mg tablet Take by mouth as needed    . QUEtiapine (SEROQUEL) 300 MG tablet Take 600 mg by mouth nightly       . sildenafil (REVATIO) 20 mg tablet Take 100 mg by mouth once daily as needed.    . tamsulosin (FLOMAX) 0.4 mg capsule Take 0.4 mg by mouth once daily     No current facility-administered medications for this visit.           Family History  Problem Relation Age of Onset  . Osteoporosis (Thinning of bones) Mother   . Asthma Mother   . Suicidality Father   . Depression Father   . Alcohol abuse Father   . Depression Maternal Grandmother   . Osteoporosis (Thinning of bones) Maternal Grandmother   . Depression Maternal Grandfather   . Myocardial Infarction (Heart attack) Maternal Grandfather   . High blood pressure (Hypertension) Maternal Grandfather   . Prostate cancer Neg Hx   . Bladder Cancer Neg Hx   . Kidney cancer Neg Hx           Objective:   Physical Exam Constitutional:      Appearance: Normal appearance.  Cardiovascular:     Rate and Rhythm: Normal rate and regular rhythm.     Pulses: Normal pulses.     Heart sounds: Normal heart sounds.  Pulmonary:     Effort: Pulmonary effort is normal.     Breath sounds: Normal breath sounds.  Abdominal:     Hernia: A hernia is present. Hernia is present in the umbilical area.       Comments: Reducible umbilical hernia, 1.5-2 cm fascial defect.  Musculoskeletal:     Cervical back: Neck supple.  Skin:    General: Skin is warm and dry.  Neurological:     Mental Status: He is alert and oriented to person, place, and time.  Psychiatric:        Behavior: Behavior normal.    Labs and Radiology:   Colonoscopy July 12, 2014 was normal.  10-year follow-up recommended.  Upper endoscopy completed the same day by Lynnae Prude, MD showed a very patulous wrap from his prior fundoplication.  Otherwise normal  exam.  Office note from Renne Crigler, MD from August 04, 2020 reviewed.  Plans for barium swallow and EGD.   CT of the chest abdomen and pelvis dated June 28, 2020 independently reviewed.  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  Small hiatal hernia.  No obstruction of barium tablet.      Assessment:     Persistent reflux symptoms in a patient post Nissen fundoplication.  Imaging abnormality on barium swallow.  Bipolar disorder.    Plan:     The imaging studies noted above been reviewed.  With the findings of possible narrowing in the distal esophagus upper endoscopy is warranted.  The patient is not a smoker so the risk of Barrett's is quite small.  At the time of his 2016 EGD there was suggestion that the previous Nissen fundoplication had significantly loosened.  Review of the barium swallow raises a question of a "slipped Nissen".  Arrangements will be made for upper endoscopy at a convenient date.    Patient to follow up as scheduled and is aware to call for any new issues or concerns.  Recommend FiberCon 2 tablets daily with a glass of water.  The patient has had good result with MiraLAX, but finds it difficult to remember this medication on a regular basis.  He had requested a recommendation for a pill that would provide relief of his constipation.  Hopefully the FiberCon will achieve this goal.  This note is partially prepared by Dorathy Daft, RN, acting as a scribe in the presence of Dr. Donnalee Curry, MD.  The documentation recorded by the scribe accurately reflects the service I personally performed and the decisions made by me.   Earline Mayotte, MD FACS

## 2020-09-05 ENCOUNTER — Other Ambulatory Visit: Payer: Self-pay

## 2020-09-05 ENCOUNTER — Encounter: Payer: Self-pay | Admitting: Psychiatry

## 2020-09-05 ENCOUNTER — Ambulatory Visit (INDEPENDENT_AMBULATORY_CARE_PROVIDER_SITE_OTHER): Payer: Medicare PPO | Admitting: Psychiatry

## 2020-09-05 DIAGNOSIS — F411 Generalized anxiety disorder: Secondary | ICD-10-CM | POA: Diagnosis not present

## 2020-09-05 DIAGNOSIS — F314 Bipolar disorder, current episode depressed, severe, without psychotic features: Secondary | ICD-10-CM

## 2020-09-05 DIAGNOSIS — F4001 Agoraphobia with panic disorder: Secondary | ICD-10-CM | POA: Diagnosis not present

## 2020-09-05 DIAGNOSIS — F5105 Insomnia due to other mental disorder: Secondary | ICD-10-CM

## 2020-09-05 MED ORDER — OLANZAPINE 15 MG PO TABS: 15.0000 mg | ORAL_TABLET | Freq: Every day | ORAL | 0 refills | Status: DC

## 2020-09-05 NOTE — Progress Notes (Signed)
Albert [name] [id number] Aug 13, 1965 55 y.o.    Subjective:   Patient ID:  Albert [name] is a 55 y.o. (DOB Aug 13, 1965) male.  Chief Complaint:  Chief Complaint  Patient presents with  . Follow-up  . Depression  . Manic Behavior    Depression        Associated symptoms include decreased concentration.  Associated symptoms include no headaches and no suicidal ideas.  Past medical history includes anxiety.   Anxiety Symptoms include decreased concentration and nervous/anxious behavior. Patient reports no confusion, dizziness, palpitations or suicidal ideas.      Albert [name] presents to the office today for follow-up of mixed bipolar, anxiety and still grieving stress of breakup.  He requires frequent follow-up because of long-term symptoms.  At visit October 25, 2018.  We made several medicine changes because of his concerns about sleep and other issues.  We increased olanzapine back to 7.5 mg bc not sleeping as well at 5 mg.  He has to have the prescription for quetiapine written for up to 3 tablets at night in case insomnia is worse because insomnia makes his mood disorder so much worse.  We discussed that was above the usual max but the request was granted given his treatment resistant status. We also reduce lithium from 900 mg daily to 750 mg daily to try to reduce tremor and muscle twitches.  At  visit March 28, 2019.  Fluoxetine was increased to 40 mg daily.   Early January 2021 increased to 60 mg daily.  No SE.  seen June 28, 2019.  No further meds were changed except olanzapine was increased back to 10 mg daily to see if if he could get additional benefit per his request.  Covid vaccinated.  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  Then had cholecystectomy.  April 2021 appt with the following noted: 2 episodes night sweats lately.  Memory has been very bad lately.  Repeatedly asks mother questions. Still  tend to stay in his room and his bed.  Still has rapid cycling mood swings.  Maybe  some better with increase in olanzapine to 10 and tolerating itl.  Would like to get out more but can't DT Covid.  Can perform necessary chores.  Will get out of the house when he can.  No change in death thoughts and anxiety in intensity but is better with frequency.  Usually comes and goes in waves but more persistent.  Consistent with meds.  Ativan not helping anxiety very dramatically but he's not sure.  Fidgety.  No trigger other than still grieving relationship and can't get it out of his head.  Poor energy, concentration and more forgetful. In bed more and less active.  Sleep ok lately which is unusual.  Can concentrate on financial matters and stock market at times.  Tolerating meds.  Still some intrusive SI without reason. Less frequent obsessive thoughts about broken relationship and has been doing this for months.  Can't let it go.  Loop.   No unusual stress even with the family who is supportive. Likes the benefit that Zyprexa gives. No sleepwalking nor falling nor odd behavior.  No history of sleep walking.  He understands this may recur with an increase.  Also wants option to rarely take extra quetiapine 300 for sleep prn. Plan:  Try to reduce lorazepam if possible.  10/22/2019 appointment with the following noted: Continues fluoxetine 60, lithium 600 mg, lorazepam 2 mg AM and HS and 1 mg midday,  olanzapine 10, quetiapine 600 mg HS. Still anxious chronically and including driving in crowded spaces.  Doesn't thing Ativan helps as well as Xanax but less cognitive problems. Sleep is not as good.  Occ EFA.  More EMA and wanting to do things in the middle of the night but this is not typical.  Wonders about why that happens.  Some napping.   Tolerating meds.  Asks about weight loss meds.  Disc this in detail.   Plan no changes except OK meclizine prn vertigo.  02/11/20 appt with the following noted: Able to gradually reduce lorazepam to 1 mg AM and HS. More depressed over time and less  interested in things and less interest in going out but does with his parents. Has reduced from 800 to 600 mg HS with some awakening but usually able to go back to sleep.[name]  Spending a good amount of time in bed bc watches TV in bedroom.  Parents watch TV in different part of the house.  No mood swings he notices.   Concerns about weight gain about 200#. Likes olanzapine's benefit for sleep. Plan: Trial for TRD to  Increase fluoxetine to 80 mg daily  to use the combo with olanzapine for TR bipolar depression.   04/21/20 appt with following noted: I thought in beginning some benefit with fluoxetine.  Lifelong negative thinking continues.   Not much bipolar.  Reads a lot on bipolar.   Still tired and anxious a little more may be seasonal.  No familial stressors.  Tends to lay down in afternoon.  Anxiety not over anything in particular.   No SE with fluoxetine. Took meclizine prn.  Easily motion sick.    Asks questions about newer drugs for bipolar like Caplyta. Plan:  No med changes  06/30/20 appt noted: Tired of wearing masks.  Vaccinated.  Asked questions about when this will end with mask mandate.  Mood up and down some since here and getting up 2-3 times.  Disc awakening problems.  Trying to do better bc night eating some.   Mood is about average today so far.  [name] out with friends for breakfast.  Recognizes activity helps mood.   3 days in a row napped a lot in the afternoon.   Bed is a comfort place.  Gets bored and hard to motivate.  Tries to stay away from night eating and spending.   More depressed when alone and wonders about med changes. Plan: Failed response to olanzapine + fluoxetine for TR bipolar depression.   Reduce fluoxetine to 1 daily and reduce olanzapine to one half nightly for 10 days and then stop it. Then start Caplyta 1 daily  08/27/2020 appointment with the following noted: Patient decompensated with the above switch to Caplyta with intrusive suicidal thoughts and had  to be hospitalized for psychiatric reasons.  Multiple phone calls with family members since that time.  Spoke with the psychiatric nurse practitioner with the decision to restart olanzapine in place of the Caplyta given the patient was more stable while on the combination of Seroquel and olanzapine than with the Caplyta. Caplyta triggered HI/SI and depressed and confused on it, and agitated and still has some of it now. No akathisia. Frustrated with lack of therapy at the hospital.   Hydroxyzine didn't help jitteriness.  Feels some better.  Fleeting SI & HI but not obsessive like it was prior to hospitalization.  Feels a little amped up and nervous.  Scared of what could happen.  Apprehensive being  here talking about things. 5-6 hours sleep last night and would like to have more. At mom and dad's house right now and up more in the day. Inadvertently stopped lorazepam abruptly by mistake likely contributing to shakiness. Still some memory issues.  Trying to stay out of bed when watching TV.  Some awakening but managing. Occ fleeting SI and distracts himself.   Always spends a lot of time just laying around.   Can have anxiety for no reason like coming here, and varies in intensity without pattern.      Less panic than in the past.  Some chronic depression and hyperactivity and loudness and hyperverbal.  Usually back to sleep.  Total 6 hours but some napping, awakens 2-4 times nightly but back to sleep..  Taking quetiapine 600 mg HS.  still lacks interest and motivation.  Can follow a TV show if interested. Plan: Restart lorazepam 1 mg in the morning, 1 mg in the afternoon and 1 mg at night for anxiety Stop hydroxyzine  Increase olanzapine to 1 and 1/2 of 10 mg tablets in evening  09/05/2020 appt noted: seen with parents today at his request Made med changes noted and tolerated the changes Better than I did last week. Now only fleeting HI/SI and "no where near what it was".  Sleeping better.  Still not a  lot of energy.  Anhedonia.  Less agitation.  A lot of anxiety all the time but it's helping.  Would like more energy and motivation.   Doesn't handle stress well. Parents note he's less shakey.  He's still staying with his parents.  Only driven once since this happened and F wants him to be able to drive and function more independently.  M agrees he's better.  Memory is better but not normal.  Primarily STM problems No akathisia with olanzapine this time.    Administering his own meds but mo watched. Slept 8 hours last night.  Psych med hx extensive including ECT and  risperidone, lithium 1200 SE, Zyprexa 20-15 akathisia, Latuda 80 which caused akathisia, Abilify, Vraylar, Rexulti, aripiprazole 20 mg with akathisia, Seroquel 1000 mg,  InVega, Geodon, Saphris with side effects, symbyax, Fanapt NR .  Caplyta SE and markedly worse. lamotrigine 300 mg, Depakote 2000 mg, Tegretol,Trileptal and several of these in combinations, gabapentin, N-acetylcysteine, Nuedexta,    Belsomra with no response,  Lunesta no response, trazodone 200 mg, Xanax, clonazepam, lorazepam less sedation. clonidine, Viibryd 40 mg for 3 months with diarrhea, protriptyline with side effects, Trintellix 20 mg, Parnate 50 mg with no response,  imipramine, venlafaxine,  Emsam 12 mg for 2 months,  bupropion was side effects,  Lexapro 20 mg, sertraline, paroxetine, Deplin, fluoxetine 80 methylphenidate 60 mg,  Vyvanse, Concerta, strattera, , modafinil,  pramipexole,  amantadine , Patient prone to akathisia.  Review of Systems:  Review of Systems  Cardiovascular: Negative for palpitations.  Musculoskeletal: Positive for back pain.  Neurological: Positive for tremors. Negative for dizziness, weakness and headaches.       Fidgety  Psychiatric/Behavioral: Positive for decreased concentration and depression. Negative for agitation, behavioral problems, confusion, dysphoric mood, hallucinations, self-injury, sleep disturbance and  suicidal ideas. The patient is nervous/anxious and is hyperactive.     Medications: I have reviewed the patient's current medications.  Current Outpatient Medications  Medication Sig Dispense Refill  . esomeprazole (NEXIUM) 40 MG capsule Take 40 mg by mouth every morning.    [name] FLUoxetine (PROZAC) 40 MG capsule Take 40 mg by mouth daily.    [name]  levothyroxine (SYNTHROID, LEVOTHROID) 75 MCG tablet Take by mouth.    [name] lisinopril (ZESTRIL) 5 MG tablet Take 5 mg by mouth daily.    [name] lithium carbonate 300 MG capsule Take 2 capsules (600 mg total) by mouth daily. 180 capsule 1  . LORazepam (ATIVAN) 1 MG tablet Take 1 tablet (1 mg total) by mouth every 8 (eight) hours. 90 tablet 1  . QUEtiapine (SEROQUEL) 300 MG tablet Take 3 tablets (900 mg) by mouth at bedtime for 3 nights, then decrease to 2 tablets (600 mg) at bedtime. (Patient taking differently: 2 tablets (600 mg) at bedtime.) 63 tablet 0  . tamsulosin (FLOMAX) 0.4 MG CAPS capsule TAKE 1 CAPSULE BY MOUTH ONCE DAILY 90 capsule 3  . fenofibrate (TRICOR) 145 MG tablet Take 145 mg by mouth daily.    [name] OLANZapine (ZYPREXA) 15 MG tablet Take 1 tablet (15 mg total) by mouth at bedtime. 90 tablet 0   No current facility-administered medications for this visit.   Medication Side Effects: Other: mild sleepiness.  Occ twitches.\, tremor  Allergies:  Allergies  Allergen Reactions  . Meloxicam Other (See Comments)    Other reaction(s): Dizziness  . Prednisone   . Wellbutrin [Bupropion]     Past Medical History:  Diagnosis Date  . Anxiety   . Bipolar disorder (HCC)   . Depression   . GERD (gastroesophageal reflux disease)     Family History  Problem Relation Age of Onset  . Prostate cancer Neg Hx   . Bladder Cancer Neg Hx   . Kidney cancer Neg Hx     Social History   Socioeconomic History  . Marital status: Single    Spouse name: Not on file  . Number of children: Not on file  . Years of education: Not on file  . Highest education  level: Not on file  Occupational History  . Not on file  Tobacco Use  . Smoking status: Former Games developer  . Smokeless tobacco: Never Used  Vaping Use  . Vaping Use: Never used  Substance and Sexual Activity  . Alcohol use: Not Currently  . Drug use: Never  . Sexual activity: Not on file  Other Topics Concern  . Not on file  Social History Narrative  . Not on file   Social Determinants of Health   Financial Resource Strain: Not on file  Food Insecurity: Not on file  Transportation Needs: Not on file  Physical Activity: Not on file  Stress: Not on file  Social Connections: Not on file  Intimate Partner Violence: Not on file    Past Medical History, Surgical history, Social history, and Family history were reviewed and updated as appropriate.   Please see review of systems for further details on the patient's review from today.   Objective:   Physical Exam:  There were no vitals taken for this visit.  Physical Exam Constitutional:      General: He is not in acute distress.    Appearance: He is well-developed.  Musculoskeletal:        General: No deformity.  Neurological:     Mental Status: He is alert and oriented to person, place, and time.     Cranial Nerves: No dysarthria.     Motor: Tremor present.     Coordination: Coordination normal.     Comments: Slight tremor  Psychiatric:        Attention and Perception: Attention and perception normal. He does not perceive auditory or visual hallucinations.  Mood and Affect: Mood is anxious and depressed. Affect is not labile, blunt, angry, tearful or inappropriate.        Speech: Speech normal. Speech is not slurred.        Behavior: Behavior normal. Behavior is not slowed. Behavior is cooperative.        Thought Content: Thought content is not paranoid or delusional. Thought content does not include homicidal or suicidal ideation. Thought content does not include homicidal or suicidal plan.        Cognition and  Memory: Cognition normal. He exhibits impaired recent memory.        Judgment: Judgment normal.     Comments: Insight fair He is chronically anxious   But better than last visit No manic signs noted. Less fidgety  Some wordfinding problems ongoing. No sui or homocidal intent or plan, but occ fleeting thoughts     Lab Review:     Component Value Date/Time   NA 135 08/11/2020 1750   NA 142 03/20/2014 0514   K 4.0 08/11/2020 1750   K 4.1 03/20/2014 0514   CL 104 08/11/2020 1750   CL 112 (H) 03/20/2014 0514   CO2 21 (L) 08/11/2020 1750   CO2 26 03/20/2014 0514   GLUCOSE 113 (H) 08/11/2020 1750   GLUCOSE 99 03/20/2014 0514   BUN 18 08/11/2020 1750   BUN 7 03/20/2014 0514   CREATININE 1.74 (H) 08/11/2020 1750   CREATININE 0.78 03/20/2014 0514   CALCIUM 9.9 08/11/2020 1750   CALCIUM 9.5 03/20/2014 0514   PROT 7.8 08/11/2020 1750   PROT 7.3 03/18/2014 1459   ALBUMIN 4.7 08/11/2020 1750   ALBUMIN 3.2 (L) 03/18/2014 1459   AST 23 08/11/2020 1750   AST 16 03/18/2014 1459   ALT 16 08/11/2020 1750   ALT 16 03/18/2014 1459   ALKPHOS 34 (L) 08/11/2020 1750   ALKPHOS 105 03/18/2014 1459   BILITOT 0.5 08/11/2020 1750   BILITOT 0.3 03/18/2014 1459   GFRNONAA 45 (L) 08/11/2020 1750   GFRNONAA >60 03/20/2014 0514   GFRAA 54 (L) 04/18/2019 1612   GFRAA >60 03/20/2014 0514       Component Value Date/Time   WBC 9.3 08/11/2020 1750   RBC 4.40 08/11/2020 1750   HGB 13.9 08/11/2020 1750   HGB 12.6 (L) 03/20/2014 0514   HCT 42.1 08/11/2020 1750   HCT 38.4 (L) 03/20/2014 0514   PLT 474 (H) 08/11/2020 1750   PLT 346 03/20/2014 0514   MCV 95.7 08/11/2020 1750   MCV 89 03/20/2014 0514   MCH 31.6 08/11/2020 1750   MCHC 33.0 08/11/2020 1750   RDW 12.2 08/11/2020 1750   RDW 12.5 03/20/2014 0514   LYMPHSABS 3.2 03/20/2014 0514   MONOABS 1.0 03/20/2014 0514   EOSABS 0.4 03/20/2014 0514   BASOSABS 0.1 03/20/2014 0514    Lithium Lvl  Date Value Ref Range Status  08/11/2020 0.95 0.60  - 1.20 mmol/L Final    Comment:    Performed at [facility], [address], [place], [place] [address]    Last lithium level Sept 0.8.   Last lithium level July 27, 2018 was normal at 1.0.   Lithium level [facility] October 03, 2018 = 1.2. Said he got lithium level at [facility] as requested.  Labs not in Epic.  Recent lipids ok except higher TG than usual.  Normal A1C.  No results found for: PHENYTOIN, PHENOBARB, VALPROATE, CBMZ   .res Assessment: Plan:    Kole was seen today for  follow-up, depression and manic behavior.  Diagnoses and all orders for this visit:  Severe bipolar I disorder, current or most recent episode depressed (HCC) -     OLANZapine (ZYPREXA) 15 MG tablet; Take 1 tablet (15 mg total) by mouth at bedtime.  Generalized anxiety disorder -     OLANZapine (ZYPREXA) 15 MG tablet; Take 1 tablet (15 mg total) by mouth at bedtime.  Panic disorder with agoraphobia -     OLANZapine (ZYPREXA) 15 MG tablet; Take 1 tablet (15 mg total) by mouth at bedtime.  Insomnia due to mental condition    Chronic TR bipolar mixed and chronic anxiety.  He usually has mixed bipolar symptoms which we have not been able to completely eliminate.  See long list of meds tried.   He is somewhat chronically unstable..  Discussed mother's concerns about his memory which is primarily a short-term memory issue.  We discussed the alternative options of using Namenda off label for mild cognitive impairment.  We are not yet able to reduce the dose of lorazepam but that could potentially help as well.  His memory was much worse on Xanax than lorazepam. We discussed the short-term risks associated with benzodiazepines including sedation and increased fall risk among others.  Discussed long-term side effect risk including dependence, potential withdrawal symptoms, and the potential eventual dose-related risk of dementia. Disc newer studies refute dementia risks.  Unfortunately due to the severity of  set his symptoms polypharmacy is a necessity.  Disc unusual combo antipsychotics but it helps more than other options.  Discussed potential metabolic side effects associated with atypical antipsychotics, as well as potential risk for movement side effects. Advised pt to contact office if movement side effects occur.  He had some falling around the time when he previously took olanzapine but he took it for several months at this dosage before he had any side effect issues and he was on Xanax at the time that the following occurred. Take LED Seroquel to sleep.   No obvious alternatives for sleep  Lithium being used bc chronic SI and death thoughts.   Counseled patient regarding potential benefits, risks, and side effects of lithium to include potential risk of lithium affecting thyroid and renal function.  Discussed need for periodic lab monitoring to determine drug level and to assess for potential adverse effects.  Counseled patient regarding signs and symptoms of lithium toxicity and advised that they notify office immediately or seek urgent medical attention if experiencing these signs and symptoms.  Patient advised to contact office with any questions or concerns. Continue lithium 2 of the 300 mg capsules. Lithium level 0.8.  On 750 mg daily.   Call if death thoughts worsen or worsening SI. Prefer only 1 med change at a time.  Continue lorazepam 1 mg in the morning, 1 mg in the afternoon and 1 mg at night for anxiety  continue olanzapine 15 mg tablets in evening He is much improved back on the olanzapine versus the Caplyta.  Previously he had difficulty tolerating 15 mg of olanzapine due to akathisia but he is not having akathisia at this time.  Disc SE. Disc combo with Seroquel may lower response rate but he doesn't think he can sleep without Seroquel.  We discussed the short-term risks associated with benzodiazepines including sedation and increased fall risk among others.  Discussed long-term  side effect risk including dependence, potential withdrawal symptoms, and the potential eventual dose-related risk of dementia.  But recent studies from 2020 dispute this  association between benzodiazepines and dementia risk. Newer studies in 2020 do not support an association with dementia.  Had reduced Quetiapine 600 mg at night for sleep.  May attempt to reduce further as long as he tolerates olanzapine because it does seem to help his sleep as well.  Discussed safety plan at length with patient.  Advised patient to contact office with any worsening signs and symptoms.  Instructed patient to go to the [facility] emergency room for evaluation if experiencing any acute safety concerns, to include suicidal intent.  Disc Ozempic for weight loss.  Has had weight gain from meds as a contributor.  This appt was 30 mins.  FU 4 weeks   Meredith Staggers, MD, DFAPA  Future Appointments  Date Time Provider Department Center  10/06/2020  9:00 AM Leafy Ro, MD [facility] None  10/16/2020  9:00 AM Cottle, Steva Ready., MD [facility] None  11/05/2020  1:30 PM Cottle, Steva Ready., MD [facility] None  12/25/2020  1:30 PM Sondra Come, MD [facility] None    No orders of the defined types were placed in this encounter.     -------------------------------

## 2020-09-09 ENCOUNTER — Other Ambulatory Visit: Payer: Self-pay | Admitting: Psychiatry

## 2020-09-09 ENCOUNTER — Telehealth: Payer: Self-pay | Admitting: Psychiatry

## 2020-09-09 MED ORDER — FLUOXETINE HCL 40 MG PO CAPS: 40.0000 mg | ORAL_CAPSULE | Freq: Every day | ORAL | 0 refills | Status: DC

## 2020-09-09 NOTE — Telephone Encounter (Signed)
Please review

## 2020-09-09 NOTE — Telephone Encounter (Signed)
Albert [name] called to request refill of Prozac.  He was prescribed this when in the hospital but they didn't give him any refills and he is about to run out.  Prozac 40 mg.  Send to [facility].  Appt 10/16/20

## 2020-09-11 ENCOUNTER — Encounter: Payer: Self-pay | Admitting: General Surgery

## 2020-09-12 ENCOUNTER — Encounter
Admission: RE | Disposition: A | Discharge status: Home / Self Care | Payer: Self-pay | Source: Home / Self Care | Attending: General Surgery

## 2020-09-12 ENCOUNTER — Ambulatory Visit: Payer: Medicare PPO | Admitting: Certified Registered Nurse Anesthetist

## 2020-09-12 ENCOUNTER — Encounter: Payer: Self-pay | Admitting: General Surgery

## 2020-09-12 ENCOUNTER — Other Ambulatory Visit: Payer: Self-pay

## 2020-09-12 ENCOUNTER — Ambulatory Visit
Admission: RE | Admit: 2020-09-12 | Discharge: 2020-09-12 | Disposition: A | Discharge status: Home / Self Care | Payer: Medicare PPO | Attending: General Surgery | Admitting: General Surgery

## 2020-09-12 DIAGNOSIS — Z87891 Personal history of nicotine dependence: Secondary | ICD-10-CM | POA: Diagnosis not present

## 2020-09-12 DIAGNOSIS — K449 Diaphragmatic hernia without obstruction or gangrene: Secondary | ICD-10-CM | POA: Insufficient documentation

## 2020-09-12 DIAGNOSIS — Z79890 Hormone replacement therapy (postmenopausal): Secondary | ICD-10-CM | POA: Diagnosis not present

## 2020-09-12 DIAGNOSIS — Z888 Allergy status to other drugs, medicaments and biological substances status: Secondary | ICD-10-CM | POA: Diagnosis not present

## 2020-09-12 DIAGNOSIS — Z79899 Other long term (current) drug therapy: Secondary | ICD-10-CM | POA: Diagnosis not present

## 2020-09-12 DIAGNOSIS — R1314 Dysphagia, pharyngoesophageal phase: Secondary | ICD-10-CM | POA: Diagnosis not present

## 2020-09-12 HISTORY — DX: Thyrotoxicosis with diffuse goiter without thyrotoxic crisis or storm: E05.00

## 2020-09-12 HISTORY — DX: Other cervical disc degeneration, unspecified cervical region: M50.30

## 2020-09-12 HISTORY — PX: ESOPHAGOGASTRODUODENOSCOPY (EGD) WITH PROPOFOL: SHX5813

## 2020-09-12 HISTORY — DX: Anemia, unspecified: D64.9

## 2020-09-12 HISTORY — DX: Male erectile dysfunction, unspecified: N52.9

## 2020-09-12 HISTORY — DX: Deviated nasal septum: J34.2

## 2020-09-12 HISTORY — DX: Nausea with vomiting, unspecified: R11.2

## 2020-09-12 HISTORY — DX: Other specified postprocedural states: Z98.890

## 2020-09-12 SURGERY — ESOPHAGOGASTRODUODENOSCOPY (EGD) WITH PROPOFOL
Anesthesia: General

## 2020-09-12 MED ORDER — LIDOCAINE HCL (PF) 2 % IJ SOLN
INTRAMUSCULAR | Status: AC
  Filled 2020-09-12: qty 2

## 2020-09-12 MED ORDER — SODIUM CHLORIDE 0.9 % IV SOLN: INTRAVENOUS | Status: DC

## 2020-09-12 MED ORDER — PROPOFOL 500 MG/50ML IV EMUL
INTRAVENOUS | Status: AC
  Filled 2020-09-12: qty 50

## 2020-09-12 MED ORDER — PROPOFOL 10 MG/ML IV BOLUS
INTRAVENOUS | Status: DC | PRN
  Administered 2020-09-12: 80 mg via INTRAVENOUS

## 2020-09-12 MED ORDER — LIDOCAINE HCL (CARDIAC) PF 100 MG/5ML IV SOSY
PREFILLED_SYRINGE | INTRAVENOUS | Status: DC | PRN
  Administered 2020-09-12: 40 mg via INTRAVENOUS

## 2020-09-12 MED ORDER — PROPOFOL 500 MG/50ML IV EMUL
INTRAVENOUS | Status: DC | PRN
  Administered 2020-09-12: 175 ug/kg/min via INTRAVENOUS

## 2020-09-12 MED ORDER — PHENYLEPHRINE HCL (PRESSORS) 10 MG/ML IV SOLN
INTRAVENOUS | Status: AC
  Filled 2020-09-12: qty 2

## 2020-09-12 NOTE — Op Note (Addendum)
[facility] Gastroenterology Patient Name: Albert [name] Procedure Date: 09/12/2020 7:27 AM MRN: [id number] Account #: [id number] Date of Birth: [birth year] Admit Type: Outpatient Age: 55 Room: [facility] ENDO ROOM 1 Gender: Male Note Status: Supervisor Override Procedure:             Upper GI endoscopy Indications:           Hiatal hernia, Esophageal dysphagia Providers:             Earline Mayotte, MD Referring MD:          Hassell Halim MD (Referring MD) Medicines:             Propofol per Anesthesia Complications:         No immediate complications. Procedure:             Pre-Anesthesia Assessment:                        - Prior to the procedure, a History and Physical was                         performed, and patient medications, allergies and                         sensitivities were reviewed. The patient's tolerance                         of previous anesthesia was reviewed.                        - The risks and benefits of the procedure and the                         sedation options and risks were discussed with the                         patient. All questions were answered and informed                         consent was obtained.                        After obtaining informed consent, the endoscope was                         passed under direct vision. Throughout the procedure,                         the patient's blood pressure, pulse, and oxygen                         saturations were monitored continuously. The Endoscope                         was introduced through the mouth, and advanced to the                         second part of duodenum. The upper GI endoscopy was  accomplished without difficulty. The patient tolerated                         the procedure well.                        Biopsies of the distal esophagus were completed due to                         the patient report of reflux symptoms on PPI  therapy. Findings:      A small hiatal hernia was present. This was biopsied with a cold forceps       for histology.      The stomach was normal.      The examined duodenum was normal. Impression:            - Small hiatal hernia. Biopsied.                        - Normal stomach.                        - Normal examined duodenum. Recommendation:        - Telephone endoscopist for pathology results in 1                         week. Procedure Code(s):     --- Professional ---                        [phone], Esophagogastroduodenoscopy, flexible,                         transoral; with biopsy, single or multiple Diagnosis Code(s):     --- Professional ---                        K44.9, Diaphragmatic hernia without obstruction or                         gangrene CPT copyright 2019 American Medical Association. All rights reserved. The codes documented in this report are preliminary and upon coder review may  be revised to meet current compliance requirements. Earline Mayotte, MD 09/12/2020 8:01:53 AM This report has been signed electronically. Number of Addenda: 0 Note Initiated On: 09/12/2020 7:27 AM Estimated Blood Loss:  Estimated blood loss: none.      [facility]

## 2020-09-12 NOTE — Anesthesia Preprocedure Evaluation (Signed)
Anesthesia Evaluation  Patient identified by MRN, date of birth, ID band Patient awake    Reviewed: Allergy & Precautions, NPO status , Patient's Chart, lab work & pertinent test results  History of Anesthesia Complications (+) PONV and history of anesthetic complications  Airway Mallampati: II  TM Distance: >3 FB Neck ROM: Full    Dental  (+) Partial Upper, Lower Dentures, Poor Dentition, Missing   Pulmonary neg pulmonary ROS, former smoker,    Pulmonary exam normal        Cardiovascular hypertension, Pt. on medications Normal cardiovascular exam     Neuro/Psych PSYCHIATRIC DISORDERS Anxiety Depression Bipolar Disorder negative neurological ROS     GI/Hepatic Neg liver ROS, GERD  Medicated,  Endo/Other  Hypothyroidism   Renal/GU Renal disease  negative genitourinary   Musculoskeletal  (+) Arthritis , Osteoarthritis,    Abdominal   Peds negative pediatric ROS (+)  Hematology negative hematology ROS (+) anemia ,   Anesthesia Other Findings   Reproductive/Obstetrics negative OB ROS                            Anesthesia Physical Anesthesia Plan  ASA: III  Anesthesia Plan: General   Post-op Pain Management:    Induction: Intravenous  PONV Risk Score and Plan: 4 or greater and Propofol infusion and TIVA  Airway Management Planned: Natural Airway and Nasal Cannula  Additional Equipment:   Intra-op Plan:   Post-operative Plan:   Informed Consent: I have reviewed the patients History and Physical, chart, labs and discussed the procedure including the risks, benefits and alternatives for the proposed anesthesia with the patient or authorized representative who has indicated his/her understanding and acceptance.       Plan Discussed with: CRNA, Anesthesiologist and Surgeon  Anesthesia Plan Comments:         Anesthesia Quick Evaluation

## 2020-09-12 NOTE — Transfer of Care (Signed)
Immediate Anesthesia Transfer of Care Note  Patient: Albert [name]  Procedure(s) Performed: ESOPHAGOGASTRODUODENOSCOPY (EGD) WITH PROPOFOL (N/A )  Patient Location: PACU  Anesthesia Type:General  Level of Consciousness: sedated  Airway & Oxygen Therapy: Patient Spontanous Breathing  Post-op Assessment: Report given to RN and Post -op Vital signs reviewed and stable  Post vital signs: Reviewed and stable  Last Vitals:  Vitals Value Taken Time  BP 113/81 09/12/20 0800  Temp    Pulse 78 09/12/20 0801  Resp 21 09/12/20 0801  SpO2 96 % 09/12/20 0801    Last Pain:  Vitals:   09/12/20 0710  TempSrc: Temporal  PainSc: 4          Complications: No complications documented.

## 2020-09-12 NOTE — H&P (Signed)
Albert [name] [id number] Mar 04, 1966     HPI:  Patient with past HX Nissen fundoplication with recurrent reflux symptoms.  Recent barium swallow suggested a stricture in the distal esophagus.  EGD planned for assessment prior to repeat fundoplication.   Medications Prior to Admission  Medication Sig Dispense Refill Last Dose  . esomeprazole (NEXIUM) 40 MG capsule Take 40 mg by mouth every morning.   09/11/2020 at Unknown time  . fenofibrate (TRICOR) 145 MG tablet Take 145 mg by mouth daily.   09/11/2020 at Unknown time  . FLUoxetine (PROZAC) 40 MG capsule Take 1 capsule (40 mg total) by mouth daily. 90 capsule 0 09/11/2020 at Unknown time  . levothyroxine (SYNTHROID, LEVOTHROID) 75 MCG tablet Take by mouth.   09/11/2020 at Unknown time  . lisinopril (ZESTRIL) 5 MG tablet Take 5 mg by mouth daily.   09/11/2020 at Unknown time  . lithium carbonate 300 MG capsule Take 2 capsules (600 mg total) by mouth daily. 180 capsule 1 09/11/2020 at Unknown time  . LORazepam (ATIVAN) 1 MG tablet Take 1 tablet (1 mg total) by mouth every 8 (eight) hours. 90 tablet 1 09/11/2020 at 2100  . OLANZapine (ZYPREXA) 15 MG tablet Take 1 tablet (15 mg total) by mouth at bedtime. 90 tablet 0 09/11/2020 at 2100  . QUEtiapine (SEROQUEL) 300 MG tablet Take 3 tablets (900 mg) by mouth at bedtime for 3 nights, then decrease to 2 tablets (600 mg) at bedtime. (Patient taking differently: 2 tablets (600 mg) at bedtime.) 63 tablet 0 09/11/2020 at 2100  . tamsulosin (FLOMAX) 0.4 MG CAPS capsule TAKE 1 CAPSULE BY MOUTH ONCE DAILY 90 capsule 3 09/11/2020 at Unknown time   Allergies  Allergen Reactions  . Meloxicam Other (See Comments)    Other reaction(s): Dizziness  . Prednisone   . Wellbutrin [Bupropion]    Past Medical History:  Diagnosis Date  . Anemia   . Anxiety   . Bipolar disorder (HCC)   . DDD (degenerative disc disease), cervical   . Depression   . Deviated septum   . ED (erectile dysfunction)   . GERD (gastroesophageal  reflux disease)   . Graves disease   . PONV (postoperative nausea and vomiting)    Past Surgical History:  Procedure Laterality Date  . BACK SURGERY    . colonoscopyx2 N/A   . ESOPHAGOGASTRODUODENOSCOPY    . EYE SURGERY    . HERNIA REPAIR     Hiatal Hernia  . SPINE SURGERY     Social History   Socioeconomic History  . Marital status: Single    Spouse name: Not on file  . Number of children: Not on file  . Years of education: Not on file  . Highest education level: Not on file  Occupational History  . Not on file  Tobacco Use  . Smoking status: Former Games developer  . Smokeless tobacco: Former Neurosurgeon    Types: Engineer, drilling  . Vaping Use: Never used  Substance and Sexual Activity  . Alcohol use: Not Currently  . Drug use: Never  . Sexual activity: Yes  Other Topics Concern  . Not on file  Social History Narrative  . Not on file   Social Determinants of Health   Financial Resource Strain: Not on file  Food Insecurity: Not on file  Transportation Needs: Not on file  Physical Activity: Not on file  Stress: Not on file  Social Connections: Not on file  Intimate Partner Violence: Not on file  Social History   Social History Narrative  . Not on file     ROS: Negative.     PE: HEENT: Negative. Lungs: Clear. Cardio: RR.   Assessment/Plan:  Proceed with planned endoscopy.  Merrily Pew [facility] 09/12/2020

## 2020-09-12 NOTE — Anesthesia Procedure Notes (Signed)
Date/Time: 09/12/2020 7:53 AM Performed by: Ginger Carne, CRNA Pre-anesthesia Checklist: Emergency Drugs available, Patient identified, Suction available, Timeout performed and Patient being monitored Patient Re-evaluated:Patient Re-evaluated prior to induction Oxygen Delivery Method: Nasal cannula Preoxygenation: Pre-oxygenation with 100% oxygen Induction Type: IV induction

## 2020-09-12 NOTE — Anesthesia Postprocedure Evaluation (Signed)
Anesthesia Post Note  Patient: [name]  Procedure(s) Performed: ESOPHAGOGASTRODUODENOSCOPY (EGD) WITH PROPOFOL (N/A )  Patient location during evaluation: PACU Anesthesia Type: General Level of consciousness: awake and alert, awake and oriented Pain management: pain level controlled Vital Signs Assessment: post-procedure vital signs reviewed and stable Respiratory status: spontaneous breathing, nonlabored ventilation and respiratory function stable Cardiovascular status: blood pressure returned to baseline and stable Postop Assessment: no apparent nausea or vomiting Anesthetic complications: no   No complications documented.   Last Vitals:  Vitals:   09/12/20 0821 09/12/20 0827  BP: 116/89 118/87  Pulse: 72 72  Resp: (!) 21 15  Temp:    SpO2: 97% 98%    Last Pain:  Vitals:   09/12/20 0800  TempSrc: Temporal  PainSc:                  Manfred Arch

## 2020-09-15 ENCOUNTER — Encounter: Payer: Self-pay | Admitting: General Surgery

## 2020-09-15 LAB — SURGICAL PATHOLOGY

## 2020-09-22 ENCOUNTER — Other Ambulatory Visit: Payer: Self-pay | Admitting: Psychiatry

## 2020-09-22 DIAGNOSIS — F411 Generalized anxiety disorder: Secondary | ICD-10-CM

## 2020-09-22 DIAGNOSIS — F4001 Agoraphobia with panic disorder: Secondary | ICD-10-CM

## 2020-10-06 ENCOUNTER — Ambulatory Visit (INDEPENDENT_AMBULATORY_CARE_PROVIDER_SITE_OTHER): Payer: Medicare PPO | Admitting: Surgery

## 2020-10-06 ENCOUNTER — Encounter: Payer: Self-pay | Admitting: Surgery

## 2020-10-06 ENCOUNTER — Other Ambulatory Visit: Payer: Self-pay

## 2020-10-06 VITALS — BP 109/77 | HR 90 | Temp 99.1°F | Ht 64.0 in | Wt 199.2 lb

## 2020-10-06 DIAGNOSIS — K449 Diaphragmatic hernia without obstruction or gangrene: Secondary | ICD-10-CM | POA: Diagnosis not present

## 2020-10-06 NOTE — H&P (View-Only) (Signed)
Outpatient Surgical Follow Up  10/06/2020  Albert [name] is an 55 y.o. male.   Chief Complaint  Patient presents with  . 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  Currently his dysphagia has improved.  He did have a recent EGD by Dr. Lemar Livings confirming paraesophageal hernia.  Biopsy shows reflux esophagitis.  No evidence of any suspicious lesions.  I have also personally reviewed the barium swallow as well as CT scan confirming above findings.  He does have a significant psychiatric history but seems to be controlled.  He does not need to have any need for preoperative optimization from a psychiatric standpoint of view.  He wishes to have this fixed by mid June.  No recent chest pains.  He is able to perform more than 4 METS of activity without any shortness of breath or chest pain.  Continues to have reflux but no dysphagia  Past Medical History:  Diagnosis Date  . Anemia   . Anxiety   . Bipolar disorder (HCC)   . DDD (degenerative disc disease), cervical   . Depression   . Deviated septum   . ED (erectile dysfunction)   . GERD (gastroesophageal reflux disease)   . Graves disease   . PONV (postoperative nausea and vomiting)     Past Surgical History:  Procedure Laterality Date  . BACK SURGERY    . colonoscopyx2 N/A   . ESOPHAGOGASTRODUODENOSCOPY    . ESOPHAGOGASTRODUODENOSCOPY (EGD) WITH PROPOFOL N/A 09/12/2020   Procedure: ESOPHAGOGASTRODUODENOSCOPY (EGD) WITH PROPOFOL;  Surgeon: Earline Mayotte, MD;  Location: [facility] ENDOSCOPY;  Service: Endoscopy;  Laterality: N/A;  1ST CASE  . EYE SURGERY    . HERNIA REPAIR     Hiatal Hernia  . SPINE SURGERY      Family History  Problem Relation Age of Onset  . Prostate cancer Neg Hx   . Bladder Cancer Neg Hx   . Kidney cancer Neg Hx     Social History:  reports that he has quit smoking. He has quit using  smokeless tobacco.  His smokeless tobacco use included chew. He reports previous alcohol use. He reports that he does not use drugs.  Allergies:  Allergies  Allergen Reactions  . Meloxicam Other (See Comments)    Other reaction(s): Dizziness  . Prednisone   . Wellbutrin [Bupropion]     Medications reviewed.    ROS Full ROS performed and is otherwise negative other than what is stated in HPI   BP 109/77   Pulse 90   Temp 99.1 F (37.3 C) (Oral)   Ht 5\' 4"  (1.626 m)   Wt 199 lb 3.2 oz (90.4 kg)   SpO2 98%   BMI 34.19 kg/m   Physical Exam Vitals and nursing note reviewed. Exam conducted with a chaperone present.  Constitutional:      General: He is not in acute distress.    Appearance: Normal appearance. He is normal weight.  Cardiovascular:     Rate and Rhythm: Normal rate and regular rhythm.     Heart sounds: No murmur heard.   Pulmonary:     Effort: Pulmonary effort is normal. No respiratory distress.     Breath sounds: Normal breath sounds. No stridor.  Abdominal:     General: Abdomen is flat. There is no distension.     Palpations: Abdomen is soft. There is no mass.  Tenderness: There is no abdominal tenderness. There is no rebound.     Hernia: A hernia is present.     Comments: 2 cm supraumbilical incisional hernia  Musculoskeletal:     Cervical back: Normal range of motion and neck supple. No rigidity or tenderness.  Skin:    General: Skin is warm and dry.     Capillary Refill: Capillary refill takes less than 2 seconds.  Neurological:     General: No focal deficit present.     Mental Status: He is alert and oriented to person, place, and time.  Psychiatric:        Mood and Affect: Mood normal.        Behavior: Behavior normal.        Thought Content: Thought content normal.        Judgment: Judgment normal.     Assessment/Plan: 68-year-old male with 3 paraesophageal hernia is recurrent and asymptomatic causing significant reflux symptoms.  No  major dysphagia issues.  In addition to this he is got a prior incisional hernia.  We will fix both of this issue during the same operative setting.  He does have bilateral inguinal hernias that at this point I would like to not fix the inguinal hernias given that we will add significant time and morbidity.  Plan to repair recurrent paraesophageal hernia and incisional hernia.  Procedure discussed with the patient in detail.  Risks, benefits and possible medications including but not limited to: Bleeding, section, years to eval and esophagus.  Dysphagia and recurrence.  He understands and wished to proceed.   Greater than 50% of the 45 minutes  visit was spent in counseling/coordination of care   Koraima Albertsen, MD FACS General Surgeon 

## 2020-10-06 NOTE — Patient Instructions (Signed)
Our surgery scheduler Britta Mccreedy will call you within 24-48 hours to get you scheduled. If you have not heard from her after 48 hours, please call our office. You will not need to get Covid tested before surgery and have the blue sheet available when she calls to write down important information.   You can pick up your prescription at [facility] @ [address] [place], [place], [place] [address].  If you have any concerns or questions, please feel free to call our office.   Hiatal Hernia  A hiatal hernia occurs when part of the stomach slides above the muscle that separates the abdomen from the chest (diaphragm). A person can be born with a hiatal hernia (congenital), or it may develop over time. In almost all cases of hiatal hernia, only the top part of the stomach pushes through the diaphragm. Many people have a hiatal hernia with no symptoms. The larger the hernia, the more likely it is that you will have symptoms. In some cases, a hiatal hernia allows stomach acid to flow back into the tube that carries food from your mouth to your stomach (esophagus). This may cause heartburn symptoms. Severe heartburn symptoms may mean that you have developed a condition called gastroesophageal reflux disease (GERD). What are the causes? This condition is caused by a weakness in the opening (hiatus) where the esophagus passes through the diaphragm to attach to the upper part of the stomach. A person may be born with a weakness in the hiatus, or a weakness can develop over time. What increases the risk? This condition is more likely to develop in:  Older people. Age is a major risk factor for a hiatal hernia, especially if you are over the age of 40.  Pregnant women.  People who are overweight.  People who have frequent constipation. What are the signs or symptoms? Symptoms of this condition usually develop in the form of GERD symptoms. Symptoms include:  Heartburn.  Belching.  Indigestion.  Trouble  swallowing.  Coughing or wheezing.  Sore throat.  Hoarseness.  Chest pain.  Nausea and vomiting. How is this diagnosed? This condition may be diagnosed during testing for GERD. Tests that may be done include:  X-rays of your stomach or chest.  An upper gastrointestinal (GI) series. This is an X-ray exam of your GI tract that is taken after you swallow a chalky liquid that shows up clearly on the X-ray.  Endoscopy. This is a procedure to look into your stomach using a thin, flexible tube that has a tiny camera and light on the end of it. How is this treated? This condition may be treated by:  Dietary and lifestyle changes to help reduce GERD symptoms.  Medicines. These may include: ? Over-the-counter antacids. ? Medicines that make your stomach empty more quickly. ? Medicines that block the production of stomach acid (H2 blockers). ? Stronger medicines to reduce stomach acid (proton pump inhibitors).  Surgery to repair the hernia, if other treatments are not helping. If you have no symptoms, you may not need treatment. Follow these instructions at home: Lifestyle and activity  Do not use any products that contain nicotine or tobacco, such as cigarettes and e-cigarettes. If you need help quitting, ask your health care provider.  Try to achieve and maintain a healthy body weight.  Avoid putting pressure on your abdomen. Anything that puts pressure on your abdomen increases the amount of acid that may be pushed up into your esophagus. ? Avoid bending over, especially  after eating. ? Raise the head of your bed by putting blocks under the legs. This keeps your head and esophagus higher than your stomach. ? Do not wear tight clothing around your chest or stomach. ? Try not to strain when having a bowel movement, when urinating, or when lifting heavy objects. Eating and drinking  Avoid foods that can worsen GERD symptoms. These may include: ? Fatty foods, like fried  foods. ? Citrus fruits, like oranges or lemon. ? Other foods and drinks that contain acid, like orange juice or tomatoes. ? Spicy food. ? Chocolate.  Eat frequent small meals instead of three large meals a day. This helps prevent your stomach from getting too full. ? Eat slowly. ? Do not lie down right after eating. ? Do not eat 1-2 hours before bed.  Do not drink beverages with caffeine. These include cola, coffee, cocoa, and tea.  Do not drink alcohol. General instructions  Take over-the-counter and prescription medicines only as told by your health care provider.  Keep all follow-up visits as told by your health care provider. This is important. Contact a health care provider if:  Your symptoms are not controlled with medicines or lifestyle changes.  You are having trouble swallowing.  You have coughing or wheezing that will not go away. Get help right away if:  Your pain is getting worse.  Your pain spreads to your arms, neck, jaw, teeth, or back.  You have shortness of breath.  You sweat for no reason.  You feel sick to your stomach (nauseous) or you vomit.  You vomit blood.  You have bright red blood in your stools.  You have black, tarry stools. This information is not intended to replace advice given to you by your health care provider. Make sure you discuss any questions you have with your health care provider. Document Revised: 04/01/2017 Document Reviewed: 11/22/2016 Elsevier Patient Education  2021 Elsevier Inc.    How to Take a [employer] IndustriesSitz Bath A sitz bath is a warm water bath that may be used to care for your rectum, genital area, or the area between your rectum and genitals (perineum). In a sitz bath, the water only comes up to your hips and covers your buttocks. A sitz bath may be done in a bathtub or with a portable sitz bath that fits over the toilet. Your health care provider may recommend a sitz bath to help:  Relieve pain and discomfort after  delivering a baby.  Relieve pain and itching from hemorrhoids or anal fissures.  Relieve pain after certain surgeries.  Relax muscles that are sore or tight. How to take a sitz bath Take 3-4 sitz baths a day, or as many as told by your health care provider. Bathtub sitz bath To take a sitz bath in a bathtub: 1. Partially fill a bathtub with warm water. The water should be deep enough to cover your hips and buttocks when you are sitting in the tub. 2. Follow your health care provider's instructions if you are told to put medicine in the water. 3. Sit in the water. Open the tub drain a little, and leave it open during your bath. 4. Turn on the warm water again, enough to replace the water that is draining out. Keep the water running throughout your bath. This helps keep the water at the right level and temperature. 5. Soak in the water for 15-20 minutes, or as long as told by your health care provider. 6. When you are done,  be careful when you stand up. You may feel dizzy. 7. After the sitz bath, pat yourself dry. Do not rub your skin to dry it.   Over-the-toilet sitz bath To take a sitz bath with an over-the-toilet basin: 1. Follow the manufacturer's instructions. 2. Fill the basin with warm water. 3. Follow your health care provider's instructions if you were told to put medicine in the water. 4. Sit on the seat. Make sure the water covers your buttocks and perineum. 5. Soak in the water for 15-20 minutes, or as long as told by your health care provider. 6. After the sitz bath, pat yourself dry. Do not rub your skin to dry it. 7. Clean and dry the basin between uses. 8. Discard the basin if it cracks, or according to the manufacturer's instructions.   Contact a health care provider if:  Your pain or itching gets worse. Do not continue with sitz baths if your symptoms get worse.  You have new symptoms. Do not continue with sitz baths until you talk with your health care  provider. Summary  A sitz bath is a warm water bath in which the water only comes up to your hips and covers your buttocks.  A sitz bath may help relieve pain and discomfort after delivering a baby. It also may help with pain and itching from hemorrhoids or anal fissures, or pain after certain surgeries. It can also help to relax muscles that are sore or tight.  Take 3-4 sitz baths a day, or as many as told by your health care provider. Soak in the water for 15-20 minutes.  Do not continue with sitz baths if your symptoms get worse. This information is not intended to replace advice given to you by your health care provider. Make sure you discuss any questions you have with your health care provider. Document Revised: 01/03/2020 Document Reviewed: 01/03/2020 Elsevier Patient Education  2021 Elsevier Inc.    High-Fiber Eating Plan Fiber, also called dietary fiber, is a type of carbohydrate. It is found foods such as fruits, vegetables, whole grains, and beans. A high-fiber diet can have many health benefits. Your health care provider may recommend a high-fiber diet to help:  Prevent constipation. Fiber can make your bowel movements more regular.  Lower your cholesterol.  Relieve the following conditions: ? Inflammation of veins in the anus (hemorrhoids). ? Inflammation of specific areas of the digestive tract (uncomplicated diverticulosis). ? A problem of the large intestine, also called the colon, that sometimes causes pain and diarrhea (irritable bowel syndrome, or IBS).  Prevent overeating as part of a weight-loss plan.  Prevent heart disease, type 2 diabetes, and certain cancers. What are tips for following this plan? Reading food labels  Check the nutrition facts label on food products for the amount of dietary fiber. Choose foods that have 5 grams of fiber or more per serving.  The goals for recommended daily fiber intake include: ? Men (age 4 or younger): 34-38 g. ? Men  (over age 3): 28-34 g. ? Women (age 58 or younger): 25-28 g. ? Women (over age 4): 22-25 g. Your daily fiber goal is _____________ g.   Shopping  Choose whole fruits and vegetables instead of processed forms, such as apple juice or applesauce.  Choose a wide variety of high-fiber foods such as avocados, lentils, oats, and kidney beans.  Read the nutrition facts label of the foods you choose. Be aware of foods with added fiber. These foods often have high sugar  and sodium amounts per serving. Cooking  Use whole-grain flour for baking and cooking.  Cook with brown rice instead of white rice. Meal planning  Start the day with a breakfast that is high in fiber, such as a cereal that contains 5 g of fiber or more per serving.  Eat breads and cereals that are made with whole-grain flour instead of refined flour or white flour.  Eat brown rice, bulgur wheat, or millet instead of white rice.  Use beans in place of meat in soups, salads, and pasta dishes.  Be sure that half of the grains you eat each day are whole grains. General information  You can get the recommended daily intake of dietary fiber by: ? Eating a variety of fruits, vegetables, grains, nuts, and beans. ? Taking a fiber supplement if you are not able to take in enough fiber in your diet. It is better to get fiber through food than from a supplement.  Gradually increase how much fiber you consume. If you increase your intake of dietary fiber too quickly, you may have bloating, cramping, or gas.  Drink plenty of water to help you digest fiber.  Choose high-fiber snacks, such as berries, raw vegetables, nuts, and popcorn. What foods should I eat? Fruits Berries. Pears. Apples. Oranges. Avocado. Prunes and raisins. Dried figs. Vegetables Sweet potatoes. Spinach. Kale. Artichokes. Cabbage. Broccoli. Cauliflower. Green peas. Carrots. Squash. Grains Whole-grain breads. Multigrain cereal. Oats and oatmeal. Brown rice.  Barley. Bulgur wheat. Millet. Quinoa. Bran muffins. Popcorn. Rye wafer crackers. Meats and other proteins Navy beans, kidney beans, and pinto beans. Soybeans. Split peas. Lentils. Nuts and seeds. Dairy Fiber-fortified yogurt. Beverages Fiber-fortified soy milk. Fiber-fortified orange juice. Other foods Fiber bars. The items listed above may not be a complete list of recommended foods and beverages. Contact a dietitian for more information. What foods should I avoid? Fruits Fruit juice. Cooked, strained fruit. Vegetables Fried potatoes. Canned vegetables. Well-cooked vegetables. Grains White bread. Pasta made with refined flour. White rice. Meats and other proteins Fatty cuts of meat. Fried chicken or fried fish. Dairy Milk. Yogurt. Cream cheese. Sour cream. Fats and oils Butters. Beverages Soft drinks. Other foods Cakes and pastries. The items listed above may not be a complete list of foods and beverages to avoid. Talk with your dietitian about what choices are best for you. Summary  Fiber is a type of carbohydrate. It is found in foods such as fruits, vegetables, whole grains, and beans.  A high-fiber diet has many benefits. It can help to prevent constipation, lower blood cholesterol, aid weight loss, and reduce your risk of heart disease, diabetes, and certain cancers.  Increase your intake of fiber gradually. Increasing fiber too quickly may cause cramping, bloating, and gas. Drink plenty of water while you increase the amount of fiber you consume.  The best sources of fiber include whole fruits and vegetables, whole grains, nuts, seeds, and beans. This information is not intended to replace advice given to you by your health care provider. Make sure you discuss any questions you have with your health care provider. Document Revised: 08/23/2019 Document Reviewed: 08/23/2019 Elsevier Patient Education  2021 [employer].

## 2020-10-06 NOTE — Progress Notes (Signed)
Outpatient Surgical Follow Up  10/06/2020  Albert [name] is an 55 y.o. male.   Chief Complaint  Patient presents with  . 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  Currently his dysphagia has improved.  He did have a recent EGD by Dr. Lemar Livings confirming paraesophageal hernia.  Biopsy shows reflux esophagitis.  No evidence of any suspicious lesions.  I have also personally reviewed the barium swallow as well as CT scan confirming above findings.  He does have a significant psychiatric history but seems to be controlled.  He does not need to have any need for preoperative optimization from a psychiatric standpoint of view.  He wishes to have this fixed by mid June.  No recent chest pains.  He is able to perform more than 4 METS of activity without any shortness of breath or chest pain.  Continues to have reflux but no dysphagia  Past Medical History:  Diagnosis Date  . Anemia   . Anxiety   . Bipolar disorder (HCC)   . DDD (degenerative disc disease), cervical   . Depression   . Deviated septum   . ED (erectile dysfunction)   . GERD (gastroesophageal reflux disease)   . Graves disease   . PONV (postoperative nausea and vomiting)     Past Surgical History:  Procedure Laterality Date  . BACK SURGERY    . colonoscopyx2 N/A   . ESOPHAGOGASTRODUODENOSCOPY    . ESOPHAGOGASTRODUODENOSCOPY (EGD) WITH PROPOFOL N/A 09/12/2020   Procedure: ESOPHAGOGASTRODUODENOSCOPY (EGD) WITH PROPOFOL;  Surgeon: Earline Mayotte, MD;  Location: [facility] ENDOSCOPY;  Service: Endoscopy;  Laterality: N/A;  1ST CASE  . EYE SURGERY    . HERNIA REPAIR     Hiatal Hernia  . SPINE SURGERY      Family History  Problem Relation Age of Onset  . Prostate cancer Neg Hx   . Bladder Cancer Neg Hx   . Kidney cancer Neg Hx     Social History:  reports that he has quit smoking. He has quit using  smokeless tobacco.  His smokeless tobacco use included chew. He reports previous alcohol use. He reports that he does not use drugs.  Allergies:  Allergies  Allergen Reactions  . Meloxicam Other (See Comments)    Other reaction(s): Dizziness  . Prednisone   . Wellbutrin [Bupropion]     Medications reviewed.    ROS Full ROS performed and is otherwise negative other than what is stated in HPI   BP 109/77   Pulse 90   Temp 99.1 F (37.3 C) (Oral)   Ht 5\' 4"  (1.626 m)   Wt 199 lb 3.2 oz (90.4 kg)   SpO2 98%   BMI 34.19 kg/m   Physical Exam Vitals and nursing note reviewed. Exam conducted with a chaperone present.  Constitutional:      General: He is not in acute distress.    Appearance: Normal appearance. He is normal weight.  Cardiovascular:     Rate and Rhythm: Normal rate and regular rhythm.     Heart sounds: No murmur heard.   Pulmonary:     Effort: Pulmonary effort is normal. No respiratory distress.     Breath sounds: Normal breath sounds. No stridor.  Abdominal:     General: Abdomen is flat. There is no distension.     Palpations: Abdomen is soft. There is no mass.  Tenderness: There is no abdominal tenderness. There is no rebound.     Hernia: A hernia is present.     Comments: 2 cm supraumbilical incisional hernia  Musculoskeletal:     Cervical back: Normal range of motion and neck supple. No rigidity or tenderness.  Skin:    General: Skin is warm and dry.     Capillary Refill: Capillary refill takes less than 2 seconds.  Neurological:     General: No focal deficit present.     Mental Status: He is alert and oriented to person, place, and time.  Psychiatric:        Mood and Affect: Mood normal.        Behavior: Behavior normal.        Thought Content: Thought content normal.        Judgment: Judgment normal.     Assessment/Plan: 55 year old male with 3 paraesophageal hernia is recurrent and asymptomatic causing significant reflux symptoms.  No  major dysphagia issues.  In addition to this he is got a prior incisional hernia.  We will fix both of this issue during the same operative setting.  He does have bilateral inguinal hernias that at this point I would like to not fix the inguinal hernias given that we will add significant time and morbidity.  Plan to repair recurrent paraesophageal hernia and incisional hernia.  Procedure discussed with the patient in detail.  Risks, benefits and possible medications including but not limited to: Bleeding, section, years to eval and esophagus.  Dysphagia and recurrence.  He understands and wished to proceed.   Greater than 50% of the 45 minutes  visit was spent in counseling/coordination of care   Sterling Big, MD [facility] General Surgeon

## 2020-10-07 ENCOUNTER — Telehealth: Payer: Self-pay | Admitting: Surgery

## 2020-10-07 NOTE — Telephone Encounter (Signed)
Patient has been advised of Pre-Admission date/time, COVID Testing date and Surgery date.  Surgery Date: 10/23/20 Preadmission Testing Date: 10/14/20 (phone 8a-1p) Covid Testing Date: 10/21/20 @ 8:30 am, patient [facility] ([address] [facility])   Patient has been made aware to call [phone], between 1-3:00pm the day before surgery, to find out what time to arrive for surgery.

## 2020-10-14 ENCOUNTER — Other Ambulatory Visit: Payer: Self-pay

## 2020-10-14 ENCOUNTER — Other Ambulatory Visit
Admission: RE | Admit: 2020-10-14 | Discharge: 2020-10-14 | Disposition: A | Discharge status: Home / Self Care | Payer: Medicare PPO | Source: Ambulatory Visit | Attending: Surgery | Admitting: Surgery

## 2020-10-14 HISTORY — DX: Pneumonia, unspecified organism: J18.9

## 2020-10-14 HISTORY — DX: Essential (primary) hypertension: I10

## 2020-10-14 HISTORY — DX: Personal history of other diseases of the digestive system: Z87.19

## 2020-10-14 HISTORY — DX: Hypothyroidism, unspecified: E03.9

## 2020-10-14 HISTORY — DX: Benign prostatic hyperplasia without lower urinary tract symptoms: N40.0

## 2020-10-14 HISTORY — DX: Chronic kidney disease, stage 3b: N18.32

## 2020-10-14 HISTORY — DX: Hypertensive chronic kidney disease with stage 1 through stage 4 chronic kidney disease, or unspecified chronic kidney disease: I12.9

## 2020-10-14 NOTE — Patient Instructions (Addendum)
Your procedure is scheduled on: Thursday, June 23 Report to the Registration Desk on the 1st floor of the [facility] To find out your arrival time, please call [phone] between 1PM - 3PM on: Wednesday, June 22  REMEMBER: Instructions that are not followed completely may result in serious medical risk, up to and including death; or upon the discretion of your surgeon and anesthesiologist your surgery may need to be rescheduled.  Do not eat food after midnight the night before surgery.  No gum chewing, lozengers or hard candies.  You may however, drink CLEAR liquids up to 2 hours before you are scheduled to arrive for your surgery. Do not drink anything within 2 hours of your scheduled arrival time.  Clear liquids include: - water  - apple juice without pulp - gatorade (not RED, PURPLE, OR BLUE) - black coffee or tea (Do NOT add milk or creamers to the coffee or tea) Do NOT drink anything that is not on this list.  TAKE THESE MEDICATIONS THE MORNING OF SURGERY WITH A SIP OF WATER:   Esomeprazole (Nexium) - (take one the night before and one on the morning of surgery - helps to prevent nausea after surgery.) Fenofibrate (Tricor) Fluoxetine (Prozac) Levothyroxine  Tamsulosin (Flomax)  One week prior to surgery: starting June 16 Stop Anti-inflammatories (NSAIDS) such as Advil, Aleve, Ibuprofen, Motrin, Naproxen, Naprosyn and Aspirin based products such as Excedrin, Goodys Powder, BC Powder. Stop ANY OVER THE COUNTER supplements until after surgery. You may however, continue to take Tylenol if needed for pain up until the day of surgery.  No Alcohol for 24 hours before or after surgery.  No Smoking including e-cigarettes for 24 hours prior to surgery.  No chewable tobacco products for at least 6 hours prior to surgery.  No nicotine patches on the day of surgery.  Do not use any "recreational" drugs for at least a week prior to your surgery.  Please be advised that the  combination of cocaine and anesthesia may have negative outcomes, up to and including death. If you test positive for cocaine, your surgery will be cancelled.  On the morning of surgery brush your teeth with toothpaste and water, you may rinse your mouth with mouthwash if you wish. Do not swallow any toothpaste or mouthwash.  Do not wear jewelry, make-up, hairpins, clips or nail polish.  Do not wear lotions, powders, or perfumes.   Do not shave body from the neck down 48 hours prior to surgery just in case you cut yourself which could leave a site for infection.  Also, freshly shaved skin may become irritated if using the CHG soap.  Contact lenses, hearing aids and dentures may not be worn into surgery.  Do not bring valuables to the hospital. [facility] is not responsible for any missing/lost belongings or valuables.   Use CHG Soap as directed on instruction sheet.  Notify your doctor if there is any change in your medical condition (cold, fever, infection).  Wear comfortable clothing (specific to your surgery type) to the hospital.  After surgery, you can help prevent lung complications by doing breathing exercises.  Take deep breaths and cough every 1-2 hours. Your doctor may order a device called an Incentive Spirometer to help you take deep breaths. When coughing or sneezing, hold a pillow firmly against your incision with both hands. This is called "splinting." Doing this helps protect your incision. It also decreases belly discomfort.  If you are being admitted to the hospital overnight,  leave your suitcase in the car. After surgery it may be brought to your room.  If you are being discharged the day of surgery, you will not be allowed to drive home. You will need a responsible adult (18 years or older) to drive you home and stay with you that night.   If you are taking public transportation, you will need to have a responsible adult (18 years or older) with you. Please  confirm with your physician that it is acceptable to use public transportation.   Please call the Pre-admissions Testing Dept. at [phone] if you have any questions about these instructions.  Surgery Visitation Policy:  Patients undergoing a surgery or procedure may have one family member or support person with them as long as that person is not COVID-19 positive or experiencing its symptoms.  That person may remain in the waiting area during the procedure.  Inpatient Visitation:    Visiting hours are 7 a.m. to 8 p.m. Inpatients will be allowed two visitors daily. The visitors may change each day during the patient's stay. No visitors under the age of 36. Any visitor under the age of 27 must be accompanied by an adult. The visitor must pass COVID-19 screenings, use hand sanitizer when entering and exiting the patient's room and wear a mask at all times, including in the patient's room. Patients must also wear a mask when staff or their visitor are in the room. Masking is required regardless of vaccination status.

## 2020-10-16 ENCOUNTER — Encounter: Payer: Self-pay | Admitting: Psychiatry

## 2020-10-16 ENCOUNTER — Encounter: Payer: Medicare PPO | Admitting: Psychiatry

## 2020-10-16 ENCOUNTER — Other Ambulatory Visit: Payer: Self-pay

## 2020-10-17 ENCOUNTER — Telehealth (INDEPENDENT_AMBULATORY_CARE_PROVIDER_SITE_OTHER): Payer: Medicare PPO | Admitting: Psychiatry

## 2020-10-17 ENCOUNTER — Encounter: Payer: Self-pay | Admitting: Psychiatry

## 2020-10-17 DIAGNOSIS — R6889 Other general symptoms and signs: Secondary | ICD-10-CM

## 2020-10-17 DIAGNOSIS — F4001 Agoraphobia with panic disorder: Secondary | ICD-10-CM

## 2020-10-17 DIAGNOSIS — F5105 Insomnia due to other mental disorder: Secondary | ICD-10-CM | POA: Diagnosis not present

## 2020-10-17 DIAGNOSIS — F314 Bipolar disorder, current episode depressed, severe, without psychotic features: Secondary | ICD-10-CM | POA: Diagnosis not present

## 2020-10-17 DIAGNOSIS — F411 Generalized anxiety disorder: Secondary | ICD-10-CM | POA: Diagnosis not present

## 2020-10-17 DIAGNOSIS — Z79899 Other long term (current) drug therapy: Secondary | ICD-10-CM

## 2020-10-17 MED ORDER — OLANZAPINE 5 MG PO TABS: ORAL_TABLET | ORAL | 0 refills | Status: DC

## 2020-10-17 MED ORDER — LITHIUM CARBONATE 300 MG PO CAPS: 600.0000 mg | ORAL_CAPSULE | Freq: Every day | ORAL | 1 refills | Status: DC

## 2020-10-17 NOTE — Patient Instructions (Signed)
Reduce Seroquel to 1 and 1/2 tablets at night Increase olanzapine to 1 of the 15 mg tablet and 1/2 of the 5 mg tablet for 1 week,  Then, if tolerated increase the olanzapine to 20 mg or 1 of the 15 mg tablets and 1 of the 5 mg tablets. If possible then also reduce quetiapine to 1 tablet at night.

## 2020-10-17 NOTE — Progress Notes (Signed)
Albert [name] [id number] [birth year] 55 y.o.    Subjective:   Patient ID:  Albert [name] is a 55 y.o. (DOB 03/16/1966) male.  Chief Complaint:  Chief Complaint  Patient presents with   Follow-up   Depression   Anxiety   Manic Behavior    Depression        Associated symptoms include decreased concentration.  Associated symptoms include no headaches and no suicidal ideas.  Past medical history includes anxiety.   Anxiety Symptoms include decreased concentration and nervous/anxious behavior. Patient reports no confusion, dizziness, palpitations or suicidal ideas.     Albert [name] presents to the office today for follow-up of mixed bipolar, anxiety and still grieving stress of breakup.  He requires frequent follow-up because of long-term symptoms.  At visit October 25, 2018.  We made several medicine changes because of his concerns about sleep and other issues.  We increased olanzapine back to 7.5 mg bc not sleeping as well at 5 mg.  He has to have the prescription for quetiapine written for up to 3 tablets at night in case insomnia is worse because insomnia makes his mood disorder so much worse.  We discussed that was above the usual max but the request was granted given his treatment resistant status. We also reduce lithium from 900 mg daily to 750 mg daily to try to reduce tremor and muscle twitches.  At  visit March 28, 2019.  Fluoxetine was increased to 40 mg daily.   Early January 2021 increased to 60 mg daily.  No SE.  seen June 28, 2019.  No further meds were changed except olanzapine was increased back to 10 mg daily to see if if he could get additional benefit per his request.  Covid vaccinated.  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  Then had cholecystectomy.  April 2021 appt with the following noted: 2 episodes night sweats lately.  Memory has been very bad lately.  Repeatedly asks mother questions. Still  tend to stay in his room and his bed.  Still has rapid cycling mood swings.   Maybe some better with increase in olanzapine to 10 and tolerating itl.  Would like to get out more but can't DT Covid.  Can perform necessary chores.  Will get out of the house when he can.  No change in death thoughts and anxiety in intensity but is better with frequency.  Usually comes and goes in waves but more persistent.  Consistent with meds.  Ativan not helping anxiety very dramatically but he's not sure.  Fidgety.  No trigger other than still grieving relationship and can't get it out of his head.  Poor energy, concentration and more forgetful. In bed more and less active.  Sleep ok lately which is unusual.  Can concentrate on financial matters and stock market at times.  Tolerating meds.  Still some intrusive SI without reason. Less frequent obsessive thoughts about broken relationship and has been doing this for months.  Can't let it go.  Loop.   No unusual stress even with the family who is supportive. Likes the benefit that Zyprexa gives. No sleepwalking nor falling nor odd behavior.  No history of sleep walking.  He understands this may recur with an increase.  Also wants option to rarely take extra quetiapine 300 for sleep prn. Plan:  Try to reduce lorazepam if possible.  10/22/2019 appointment with the following noted: Continues fluoxetine 60, lithium 600 mg, lorazepam 2 mg AM and HS and 1  mg midday, olanzapine 10, quetiapine 600 mg HS. Still anxious chronically and including driving in crowded spaces.  Doesn't thing Ativan helps as well as Xanax but less cognitive problems. Sleep is not as good.  Occ EFA.  More EMA and wanting to do things in the middle of the night but this is not typical.  Wonders about why that happens.  Some napping.   Tolerating meds.  Asks about weight loss meds.  Disc this in detail.   Plan no changes except OK meclizine prn vertigo.  02/11/20 appt with the following noted: Able to gradually reduce lorazepam to 1 mg AM and HS. More depressed over time and less  interested in things and less interest in going out but does with his parents. Has reduced from 800 to 600 mg HS with some awakening but usually able to go back to sleep.[name]  Spending a good amount of time in bed bc watches TV in bedroom.  Parents watch TV in different part of the house.  No mood swings he notices.   Concerns about weight gain about 200#. Likes olanzapine's benefit for sleep. Plan: Trial for TRD to  Increase fluoxetine to 80 mg daily  to use the combo with olanzapine for TR bipolar depression.   04/21/20 appt with following noted: I thought in beginning some benefit with fluoxetine.  Lifelong negative thinking continues.   Not much bipolar.  Reads a lot on bipolar.   Still tired and anxious a little more may be seasonal.  No familial stressors.  Tends to lay down in afternoon.  Anxiety not over anything in particular.   No SE with fluoxetine. Took meclizine prn.  Easily motion sick.    Asks questions about newer drugs for bipolar like Caplyta. Plan:  No med changes  06/30/20 appt noted: Tired of wearing masks.  Vaccinated.  Asked questions about when this will end with mask mandate.  Mood up and down some since here and getting up 2-3 times.  Disc awakening problems.  Trying to do better bc night eating some.   Mood is about average today so far.  [name] out with friends for breakfast.  Recognizes activity helps mood.   3 days in a row napped a lot in the afternoon.   Bed is a comfort place.  Gets bored and hard to motivate.  Tries to stay away from night eating and spending.   More depressed when alone and wonders about med changes. Plan: Failed response to olanzapine + fluoxetine for TR bipolar depression.   Reduce fluoxetine to 1 daily and reduce olanzapine to one half nightly for 10 days and then stop it. Then start Caplyta 1 daily  08/27/2020 appointment with the following noted: Patient decompensated with the above switch to Caplyta with intrusive suicidal thoughts and had  to be hospitalized for psychiatric reasons.  Multiple phone calls with family members since that time.  Spoke with the psychiatric nurse practitioner with the decision to restart olanzapine in place of the Caplyta given the patient was more stable while on the combination of Seroquel and olanzapine than with the Caplyta. Caplyta triggered HI/SI and depressed and confused on it, and agitated and still has some of it now. No akathisia. Frustrated with lack of therapy at the hospital.   Hydroxyzine didn't help jitteriness.  Feels some better.  Fleeting SI & HI but not obsessive like it was prior to hospitalization.  Feels a little amped up and nervous.  Scared of what could happen.  Apprehensive being here talking about things. 5-6 hours sleep last night and would like to have more. At mom and dad's house right now and up more in the day. Inadvertently stopped lorazepam abruptly by mistake likely contributing to shakiness. Still some memory issues.  Trying to stay out of bed when watching TV.  Some awakening but managing. Occ fleeting SI and distracts himself.   Always spends a lot of time just laying around.   Can have anxiety for no reason like coming here, and varies in intensity without pattern.      Less panic than in the past.  Some chronic depression and hyperactivity and loudness and hyperverbal.  Usually back to sleep.  Total 6 hours but some napping, awakens 2-4 times nightly but back to sleep..  Taking quetiapine 600 mg HS.  still lacks interest and motivation.  Can follow a TV show if interested. Plan: Restart lorazepam 1 mg in the morning, 1 mg in the afternoon and 1 mg at night for anxiety Stop hydroxyzine  Increase olanzapine to 1 and 1/2 of 10 mg tablets in evening  09/05/2020 appt noted: seen with parents today at his request Made med changes noted and tolerated the changes Better than I did last week. Now only fleeting HI/SI and "no where near what it was".  Sleeping better.  Still not a  lot of energy.  Anhedonia.  Less agitation.  A lot of anxiety all the time but it's helping.  Would like more energy and motivation.   Doesn't handle stress well. Parents note he's less shakey.  He's still staying with his parents.  Only driven once since this happened and F wants him to be able to drive and function more independently.  M agrees he's better.  Memory is better but not normal.  Primarily STM problems No akathisia with olanzapine this time.    Administering his own meds but mo watched. Slept 8 hours last night. Plan consider further reductions in quetiapine  10/17/20 appt noted: Stayed on Seroquel 600 HS and olanzapine 15. Olanzapine seemed to do most for the sleep.  Also helping eliminated HI/SI for the most part.  Wants to try to increase it.  Depression is much better with less rumination also.  Fears akathisia at higher dose as in the past. Also on fluoxetine 40.    Psych med hx extensive including ECT and  risperidone, lithium 1200 SE, Zyprexa 20-15 akathisia, Latuda 80 which caused akathisia, Abilify, Vraylar, Rexulti, aripiprazole 20 mg with akathisia, Seroquel 1000 mg,  InVega, Geodon, Saphris with side effects, symbyax, Fanapt NR .  Caplyta SE and markedly worse. lamotrigine 300 mg, Depakote 2000 mg, Tegretol,Trileptal and several of these in combinations, gabapentin, N-acetylcysteine, Nuedexta,    Belsomra with no response,  Lunesta no response, trazodone 200 mg, Xanax, clonazepam, lorazepam less sedation. clonidine, Viibryd 40 mg for 3 months with diarrhea, protriptyline with side effects, Trintellix 20 mg, Parnate 50 mg with no response,  imipramine, venlafaxine,  Emsam 12 mg for 2 months,  bupropion was side effects,  Lexapro 20 mg, sertraline, paroxetine, Deplin, fluoxetine 80 methylphenidate 60 mg,  Vyvanse, Concerta, strattera, , modafinil,  pramipexole,  amantadine , Patient prone to akathisia.  Review of Systems:  Review of Systems  Cardiovascular:  Negative  for palpitations.  Musculoskeletal:  Positive for back pain.  Neurological:  Positive for tremors. Negative for dizziness, weakness, light-headedness and headaches.       Fidgety  Psychiatric/Behavioral:  Positive for decreased concentration. Negative for  agitation, behavioral problems, confusion, dysphoric mood, hallucinations, self-injury, sleep disturbance and suicidal ideas. The patient is nervous/anxious and is hyperactive.    Medications: I have reviewed the patient's current medications.  Current Outpatient Medications  Medication Sig Dispense Refill   acetaminophen (TYLENOL) 500 MG tablet Take 1,000 mg by mouth every 6 (six) hours as needed for moderate pain.     esomeprazole (NEXIUM) 40 MG capsule Take 40 mg by mouth every morning.     fenofibrate (TRICOR) 145 MG tablet Take 145 mg by mouth daily.     FLUoxetine (PROZAC) 40 MG capsule Take 1 capsule (40 mg total) by mouth daily. 90 capsule 0   fluticasone (FLONASE) 50 MCG/ACT nasal spray Place 1 spray into both nostrils daily as needed for allergies or rhinitis.     hydrocortisone (ANUSOL-HC) 2.5 % rectal cream Place 1 application rectally 3 (three) times daily as needed for hemorrhoids.     levothyroxine (SYNTHROID, LEVOTHROID) 75 MCG tablet Take 75 mcg by mouth daily before breakfast.     lisinopril (ZESTRIL) 5 MG tablet Take 5 mg by mouth daily.     lithium carbonate 300 MG capsule Take 2 capsules (600 mg total) by mouth daily. (Patient taking differently: Take 600 mg by mouth at bedtime.) 180 capsule 1   LORazepam (ATIVAN) 1 MG tablet TAKE 1 TABLET BY MOUTH EVERY 8 HOURS (Patient taking differently: Take 1 mg by mouth in the morning, at noon, and at bedtime.) 90 tablet 3   meclizine (ANTIVERT) 25 MG tablet Take 25 mg by mouth 3 (three) times daily as needed for dizziness.     OLANZapine (ZYPREXA) 15 MG tablet Take 1 tablet (15 mg total) by mouth at bedtime. 90 tablet 0   QUEtiapine (SEROQUEL) 300 MG tablet Take 3 tablets (900 mg)  by mouth at bedtime for 3 nights, then decrease to 2 tablets (600 mg) at bedtime. (Patient taking differently: 2 tablets (600 mg) at bedtime.) 63 tablet 0   sildenafil (REVATIO) 20 MG tablet Take 100 mg by mouth daily as needed (ED).     tamsulosin (FLOMAX) 0.4 MG CAPS capsule TAKE 1 CAPSULE BY MOUTH ONCE DAILY (Patient taking differently: Take 0.4 mg by mouth daily.) 90 capsule 3   No current facility-administered medications for this visit.   Medication Side Effects: Other: mild sleepiness.   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    Hypothyroidism    Pneumonia    PONV (postoperative nausea and vomiting)     Family History  Problem Relation Age of Onset   Prostate cancer Neg Hx    Bladder Cancer Neg Hx    Kidney cancer Neg Hx     Social History   Socioeconomic History   Marital  status: Single    Spouse name: Not on file   Number of children: Not on file   Years of education: Not on file   Highest education level: Not on file  Occupational History   Not on file  Tobacco Use   Smoking status: Former    Pack years: 0.00    Types: Cigarettes   Smokeless tobacco: Former    Types: Associate Professor Use: Never used  Substance and Sexual Activity   Alcohol use: Not Currently   Drug use: Never   Sexual activity: Yes  Other Topics Concern   Not on  file  Social History Narrative   Not on file   Social Determinants of Health   Financial Resource Strain: Not on file  Food Insecurity: Not on file  Transportation Needs: Not on file  Physical Activity: Not on file  Stress: Not on file  Social Connections: Not on file  Intimate Partner Violence: Not on file    Past Medical History, Surgical history, Social history, and Family history were reviewed and updated as appropriate.   Please see review of systems for further details on the patient's review from today.   Objective:   Physical Exam:  There were no vitals taken for this visit.  Physical Exam Constitutional:      General: He is not in acute distress.    Appearance: He is well-developed.  Musculoskeletal:        General: No deformity.  Neurological:     Mental Status: He is alert and oriented to person, place, and time.     Cranial Nerves: No dysarthria.     Motor: Tremor present.     Coordination: Coordination normal.     Comments: Slight tremor  Psychiatric:        Attention and Perception: Attention and perception normal. He does not perceive auditory or visual hallucinations.        Mood and Affect: Mood is anxious and depressed. Affect is not labile, blunt, angry, tearful or inappropriate.        Speech: Speech normal. Speech is not slurred.        Behavior: Behavior normal. Behavior is not slowed. Behavior is cooperative.        Thought Content: Thought content is not paranoid or delusional. Thought content does not include homicidal or suicidal ideation. Thought content does not include homicidal or suicidal plan.        Cognition and Memory: Cognition normal. He exhibits impaired recent memory.        Judgment: Judgment normal.     Comments: Insight fair He is chronically anxious   But better than last visit No manic signs noted. Some wordfinding problems ongoing. No sui or homocidal intent or plan, but occ fleeting thoughts    Lab Review:      Component Value Date/Time   NA 135 08/11/2020 1750   NA 142 03/20/2014 0514   K 4.0 08/11/2020 1750   K 4.1 03/20/2014 0514   CL 104 08/11/2020 1750   CL 112 (H) 03/20/2014 0514   CO2 21 (L) 08/11/2020 1750   CO2 26 03/20/2014 0514   GLUCOSE 113 (H) 08/11/2020 1750   GLUCOSE 99 03/20/2014 0514   BUN 18 08/11/2020 1750   BUN 7 03/20/2014 0514   CREATININE 1.74 (H) 08/11/2020 1750   CREATININE 0.78 03/20/2014 0514   CALCIUM 9.9 08/11/2020 1750   CALCIUM 9.5 03/20/2014 0514   PROT 7.8 08/11/2020 1750  PROT 7.3 03/18/2014 1459   ALBUMIN 4.7 08/11/2020 1750   ALBUMIN 3.2 (L) 03/18/2014 1459   AST 23 08/11/2020 1750   AST 16 03/18/2014 1459   ALT 16 08/11/2020 1750   ALT 16 03/18/2014 1459   ALKPHOS 34 (L) 08/11/2020 1750   ALKPHOS 105 03/18/2014 1459   BILITOT 0.5 08/11/2020 1750   BILITOT 0.3 03/18/2014 1459   GFRNONAA 45 (L) 08/11/2020 1750   GFRNONAA >60 03/20/2014 0514   GFRAA 54 (L) 04/18/2019 1612   GFRAA >60 03/20/2014 0514       Component Value Date/Time   WBC 9.3 08/11/2020 1750   RBC 4.40 08/11/2020 1750   HGB 13.9 08/11/2020 1750   HGB 12.6 (L) 03/20/2014 0514   HCT 42.1 08/11/2020 1750   HCT 38.4 (L) 03/20/2014 0514   PLT 474 (H) 08/11/2020 1750   PLT 346 03/20/2014 0514   MCV 95.7 08/11/2020 1750   MCV 89 03/20/2014 0514   MCH 31.6 08/11/2020 1750   MCHC 33.0 08/11/2020 1750   RDW 12.2 08/11/2020 1750   RDW 12.5 03/20/2014 0514   LYMPHSABS 3.2 03/20/2014 0514   MONOABS 1.0 03/20/2014 0514   EOSABS 0.4 03/20/2014 0514   BASOSABS 0.1 03/20/2014 0514    Lithium Lvl  Date Value Ref Range Status  08/11/2020 0.95 0.60 - 1.20 mmol/L Final    Comment:    Performed at [facility], [address], [place], [place] [address]    Last lithium level Sept 0.8.   Last lithium level July 27, 2018 was normal at 1.0.   Lithium level [facility] October 03, 2018 = 1.2. Said he got lithium level at [facility] as requested.  Labs not in Epic.  Recent lipids  ok except higher TG than usual.  Normal A1C.  No results found for: PHENYTOIN, PHENOBARB, VALPROATE, CBMZ   .res Assessment: Plan:    Kadence was seen today for follow-up, depression, anxiety and manic behavior.  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  He usually has mixed bipolar symptoms which we have not been able to completely eliminate.  See long list of meds tried.   He is somewhat chronically unstable..  Discussed mother's concerns about his memory which is primarily a short-term memory issue.  We discussed the alternative options of using Namenda off label for mild cognitive impairment.  We are not yet able to reduce the dose of lorazepam but that could potentially help as well.  His memory was much worse on Xanax than lorazepam. We discussed the short-term risks associated with benzodiazepines including sedation and increased fall risk among others.  Discussed long-term side effect risk including dependence, potential withdrawal symptoms, and the potential eventual dose-related risk of dementia. Disc newer studies refute dementia risks.  Unfortunately due to the severity of set his symptoms polypharmacy is a necessity.  Disc unusual combo antipsychotics but it helps more than other options.  Discussed potential metabolic side effects associated with atypical antipsychotics, as well as potential risk for movement side effects. Advised pt to contact office if movement side effects occur.  He had some falling around the time when he previously took olanzapine but he took it for several months at this dosage before he had any side effect issues and he  was on Xanax at the time that the following occurred. Take LED Seroquel to sleep.   No  obvious alternatives for sleep  Lithium being used bc chronic SI and death thoughts.   Counseled patient regarding potential benefits, risks, and side effects of lithium to include potential risk of lithium affecting thyroid and renal function.  Discussed need for periodic lab monitoring to determine drug level and to assess for potential adverse effects.  Counseled patient regarding signs and symptoms of lithium toxicity and advised that they notify office immediately or seek urgent medical attention if experiencing these signs and symptoms.  Patient advised to contact office with any questions or concerns. Continue lithium 2 of the 300 mg capsules. Lithium level 0.8.  On 750 mg daily.   Call if death thoughts worsen or worsening SI. Prefer only 1 med change at a time.  Continue lorazepam 1 mg in the morning, 1 mg in the afternoon and 1 mg at night for anxiety  He is much improved back on the olanzapine versus the Caplyta.  Previously he had difficulty tolerating 15 mg of olanzapine due to akathisia but he is not having akathisia at this time.  Disc SE. Disc combo with Seroquel may lower response rate but he doesn't think he can sleep without Seroquel.  Reduce Seroquel to 1 and 1/2 tablets at night Increase olanzapine to 1 of the 15 mg tablet and 1/2 of the 5 mg tablet for 1 week,  Then, if tolerated increase the olanzapine to 20 mg or 1 of the 15 mg tablets and 1 of the 5 mg tablets. If possible then also reduce quetiapine to 1 tablet at night.  We discussed the short-term risks associated with benzodiazepines including sedation and increased fall risk among others.  Discussed long-term side effect risk including dependence, potential withdrawal symptoms, and the potential eventual dose-related risk of dementia.  But recent studies from 2020 dispute this association between benzodiazepines and dementia risk. Newer studies in 2020 do not support an association with dementia.  Discussed safety  plan at length with patient.  Advised patient to contact office with any worsening signs and symptoms.  Instructed patient to go to the [facility] emergency room for evaluation if experiencing any acute safety concerns, to include suicidal intent.  Disc Ozempic for weight loss.  Has had weight gain from meds as a contributor.  This appt was 30 mins.  FU 4 weeks   Meredith Staggers, MD, DFAPA  Future Appointments  Date Time Provider Department Center  10/21/2020  8:30 AM [facility] None  11/05/2020  1:30 PM Cottle, Steva Ready., MD [facility] None  12/25/2020  1:30 PM Sondra Come, MD [facility] None    No orders of the defined types were placed in this encounter.     -------------------------------

## 2020-10-21 ENCOUNTER — Other Ambulatory Visit
Admission: RE | Admit: 2020-10-21 | Discharge: 2020-10-21 | Disposition: A | Discharge status: Home / Self Care | Payer: Medicare PPO | Source: Ambulatory Visit | Attending: Surgery | Admitting: Surgery

## 2020-10-21 ENCOUNTER — Other Ambulatory Visit: Payer: Self-pay

## 2020-10-21 DIAGNOSIS — Z01812 Encounter for preprocedural laboratory examination: Secondary | ICD-10-CM | POA: Insufficient documentation

## 2020-10-21 DIAGNOSIS — Z20822 Contact with and (suspected) exposure to covid-19: Secondary | ICD-10-CM | POA: Insufficient documentation

## 2020-10-21 LAB — SARS CORONAVIRUS 2 (TAT 6-24 HRS): SARS Coronavirus 2: NEGATIVE

## 2020-10-22 MED ORDER — CHLORHEXIDINE GLUCONATE 0.12 % MT SOLN
15.0000 mL | Freq: Once | OROMUCOSAL | Status: AC
Start: 2020-10-22 — End: 2020-10-23

## 2020-10-22 MED ORDER — APREPITANT 40 MG PO CAPS: 40.0000 mg | ORAL_CAPSULE | Freq: Once | ORAL | Status: AC

## 2020-10-22 MED ORDER — LACTATED RINGERS IV SOLN: INTRAVENOUS | Status: DC

## 2020-10-22 MED ORDER — ORAL CARE MOUTH RINSE: 15.0000 mL | Freq: Once | OROMUCOSAL | Status: AC

## 2020-10-23 ENCOUNTER — Inpatient Hospital Stay
Admission: RE | Admit: 2020-10-23 | Discharge: 2020-10-27 | DRG: 328 | Disposition: A | Discharge status: Home Health | Payer: Medicare PPO | Attending: Surgery | Admitting: Surgery

## 2020-10-23 ENCOUNTER — Other Ambulatory Visit: Payer: Self-pay

## 2020-10-23 ENCOUNTER — Inpatient Hospital Stay: Payer: Medicare PPO | Admitting: Certified Registered Nurse Anesthetist

## 2020-10-23 ENCOUNTER — Encounter
Admission: RE | Disposition: A | Discharge status: Home / Self Care | Payer: Self-pay | Source: Home / Self Care | Attending: Surgery

## 2020-10-23 ENCOUNTER — Encounter: Payer: Self-pay | Admitting: Surgery

## 2020-10-23 DIAGNOSIS — Z886 Allergy status to analgesic agent status: Secondary | ICD-10-CM

## 2020-10-23 DIAGNOSIS — K432 Incisional hernia without obstruction or gangrene: Secondary | ICD-10-CM | POA: Diagnosis present

## 2020-10-23 DIAGNOSIS — Z8719 Personal history of other diseases of the digestive system: Secondary | ICD-10-CM

## 2020-10-23 DIAGNOSIS — R1310 Dysphagia, unspecified: Secondary | ICD-10-CM | POA: Diagnosis present

## 2020-10-23 DIAGNOSIS — E039 Hypothyroidism, unspecified: Secondary | ICD-10-CM | POA: Diagnosis present

## 2020-10-23 DIAGNOSIS — Z888 Allergy status to other drugs, medicaments and biological substances status: Secondary | ICD-10-CM

## 2020-10-23 DIAGNOSIS — F319 Bipolar disorder, unspecified: Secondary | ICD-10-CM | POA: Diagnosis present

## 2020-10-23 DIAGNOSIS — Z79890 Hormone replacement therapy (postmenopausal): Secondary | ICD-10-CM

## 2020-10-23 DIAGNOSIS — K440 Diaphragmatic hernia with obstruction, without gangrene: Secondary | ICD-10-CM | POA: Diagnosis not present

## 2020-10-23 DIAGNOSIS — K2100 Gastro-esophageal reflux disease with esophagitis, without bleeding: Secondary | ICD-10-CM | POA: Diagnosis present

## 2020-10-23 DIAGNOSIS — K449 Diaphragmatic hernia without obstruction or gangrene: Secondary | ICD-10-CM | POA: Diagnosis present

## 2020-10-23 DIAGNOSIS — Z79899 Other long term (current) drug therapy: Secondary | ICD-10-CM

## 2020-10-23 DIAGNOSIS — Z720 Tobacco use: Secondary | ICD-10-CM | POA: Diagnosis not present

## 2020-10-23 DIAGNOSIS — F419 Anxiety disorder, unspecified: Secondary | ICD-10-CM | POA: Diagnosis present

## 2020-10-23 DIAGNOSIS — K4020 Bilateral inguinal hernia, without obstruction or gangrene, not specified as recurrent: Secondary | ICD-10-CM | POA: Diagnosis present

## 2020-10-23 DIAGNOSIS — Z20822 Contact with and (suspected) exposure to covid-19: Secondary | ICD-10-CM | POA: Diagnosis present

## 2020-10-23 DIAGNOSIS — Z9889 Other specified postprocedural states: Secondary | ICD-10-CM

## 2020-10-23 HISTORY — PX: XI ROBOTIC ASSISTED PARAESOPHAGEAL HERNIA REPAIR: SHX6871

## 2020-10-23 LAB — CBC
HCT: 34.9 % — ABNORMAL LOW (ref 39.0–52.0)
Hemoglobin: 11.1 g/dL — ABNORMAL LOW (ref 13.0–17.0)
MCH: 30.9 pg (ref 26.0–34.0)
MCHC: 31.8 g/dL (ref 30.0–36.0)
MCV: 97.2 fL (ref 80.0–100.0)
Platelets: 349 10*3/uL (ref 150–400)
RBC: 3.59 MIL/uL — ABNORMAL LOW (ref 4.22–5.81)
RDW: 12.9 % (ref 11.5–15.5)
WBC: 14.3 10*3/uL — ABNORMAL HIGH (ref 4.0–10.5)
nRBC: 0 % (ref 0.0–0.2)

## 2020-10-23 LAB — CREATININE, SERUM
Creatinine, Ser: 1.61 mg/dL — ABNORMAL HIGH (ref 0.61–1.24)
GFR, Estimated: 50 mL/min — ABNORMAL LOW (ref >60)

## 2020-10-23 LAB — HIV ANTIBODY (ROUTINE TESTING W REFLEX): HIV Screen 4th Generation wRfx: NONREACTIVE

## 2020-10-23 SURGERY — REPAIR, HERNIA, PARAESOPHAGEAL, ROBOT-ASSISTED
Anesthesia: General

## 2020-10-23 MED ORDER — BUPIVACAINE-EPINEPHRINE 0.25% -1:200000 IJ SOLN
INTRAMUSCULAR | Status: DC | PRN
  Administered 2020-10-23: 30 mL

## 2020-10-23 MED ORDER — ACETAMINOPHEN 500 MG PO TABS
ORAL_TABLET | ORAL | Status: AC
  Administered 2020-10-23: 1000 mg via ORAL
  Filled 2020-10-23: qty 2

## 2020-10-23 MED ORDER — ACETAMINOPHEN 500 MG PO TABS: 1000.0000 mg | ORAL_TABLET | ORAL | Status: AC

## 2020-10-23 MED ORDER — ACETAMINOPHEN 500 MG PO TABS
1000.0000 mg | ORAL_TABLET | Freq: Four times a day (QID) | ORAL | Status: DC
  Administered 2020-10-23 – 2020-10-27 (×15): 1000 mg via ORAL
  Filled 2020-10-23 (×15): qty 2

## 2020-10-23 MED ORDER — PANTOPRAZOLE SODIUM 40 MG PO TBEC
40.0000 mg | DELAYED_RELEASE_TABLET | Freq: Every day | ORAL | Status: DC
  Administered 2020-10-24 – 2020-10-27 (×4): 40 mg via ORAL
  Filled 2020-10-23 (×4): qty 1

## 2020-10-23 MED ORDER — APREPITANT 40 MG PO CAPS
ORAL_CAPSULE | ORAL | Status: AC
  Administered 2020-10-23: 40 mg via ORAL
  Filled 2020-10-23: qty 1

## 2020-10-23 MED ORDER — IPRATROPIUM-ALBUTEROL 0.5-2.5 (3) MG/3ML IN SOLN
RESPIRATORY_TRACT | Status: AC
  Administered 2020-10-23: 3 mL via RESPIRATORY_TRACT
  Filled 2020-10-23: qty 3

## 2020-10-23 MED ORDER — PROCHLORPERAZINE EDISYLATE 10 MG/2ML IJ SOLN
5.0000 mg | Freq: Four times a day (QID) | INTRAMUSCULAR | Status: DC | PRN
Start: 2020-10-23 — End: 2020-10-27
  Filled 2020-10-23: qty 2

## 2020-10-23 MED ORDER — CHLORHEXIDINE GLUCONATE 0.12 % MT SOLN
OROMUCOSAL | Status: AC
  Administered 2020-10-23: 15 mL via OROMUCOSAL
  Filled 2020-10-23: qty 15

## 2020-10-23 MED ORDER — SCOPOLAMINE 1 MG/3DAYS TD PT72
MEDICATED_PATCH | TRANSDERMAL | Status: DC | PRN
  Administered 2020-10-23: 1 via TRANSDERMAL

## 2020-10-23 MED ORDER — SODIUM CHLORIDE 0.9 % IR SOLN
Status: DC | PRN
  Administered 2020-10-23: 100 mL

## 2020-10-23 MED ORDER — OLANZAPINE 5 MG PO TABS
5.0000 mg | ORAL_TABLET | Freq: Every day | ORAL | Status: DC
  Administered 2020-10-24 – 2020-10-26 (×3): 5 mg via ORAL
  Filled 2020-10-23 (×4): qty 1

## 2020-10-23 MED ORDER — BUPIVACAINE-EPINEPHRINE (PF) 0.25% -1:200000 IJ SOLN
INTRAMUSCULAR | Status: AC
  Filled 2020-10-23: qty 30

## 2020-10-23 MED ORDER — ENOXAPARIN SODIUM 40 MG/0.4ML IJ SOSY
40.0000 mg | PREFILLED_SYRINGE | INTRAMUSCULAR | Status: DC
  Administered 2020-10-24 – 2020-10-27 (×4): 40 mg via SUBCUTANEOUS
  Filled 2020-10-23 (×4): qty 0.4

## 2020-10-23 MED ORDER — BUPIVACAINE LIPOSOME 1.3 % IJ SUSP
INTRAMUSCULAR | Status: DC | PRN
  Administered 2020-10-23: 20 mL

## 2020-10-23 MED ORDER — LIDOCAINE HCL (CARDIAC) PF 100 MG/5ML IV SOSY
PREFILLED_SYRINGE | INTRAVENOUS | Status: DC | PRN
  Administered 2020-10-23: 100 mg via INTRAVENOUS

## 2020-10-23 MED ORDER — VISTASEAL 10 ML SINGLE DOSE KIT
PACK | CUTANEOUS | Status: AC
  Filled 2020-10-23: qty 10

## 2020-10-23 MED ORDER — CEFAZOLIN SODIUM-DEXTROSE 2-4 GM/100ML-% IV SOLN
2.0000 g | INTRAVENOUS | Status: AC
  Administered 2020-10-23 (×2): 2 g via INTRAVENOUS

## 2020-10-23 MED ORDER — FLUTICASONE PROPIONATE 50 MCG/ACT NA SUSP
1.0000 | Freq: Every day | NASAL | Status: DC | PRN
  Filled 2020-10-23: qty 16

## 2020-10-23 MED ORDER — MORPHINE SULFATE (PF) 2 MG/ML IV SOLN
2.0000 mg | INTRAVENOUS | Status: DC | PRN
Start: 2020-10-23 — End: 2020-10-27

## 2020-10-23 MED ORDER — MIDAZOLAM HCL 2 MG/2ML IJ SOLN
INTRAMUSCULAR | Status: DC | PRN
  Administered 2020-10-23: 2 mg via INTRAVENOUS

## 2020-10-23 MED ORDER — SODIUM CHLORIDE 0.9 % IV SOLN: INTRAVENOUS | Status: DC

## 2020-10-23 MED ORDER — PROPOFOL 500 MG/50ML IV EMUL
INTRAVENOUS | Status: DC | PRN
  Administered 2020-10-23: 75 ug/kg/min via INTRAVENOUS

## 2020-10-23 MED ORDER — DEXAMETHASONE SODIUM PHOSPHATE 10 MG/ML IJ SOLN
INTRAMUSCULAR | Status: DC | PRN
  Administered 2020-10-23: 10 mg via INTRAVENOUS

## 2020-10-23 MED ORDER — SUGAMMADEX SODIUM 500 MG/5ML IV SOLN
INTRAVENOUS | Status: DC | PRN
  Administered 2020-10-23: 200 mg via INTRAVENOUS
  Administered 2020-10-23: 400 mg via INTRAVENOUS

## 2020-10-23 MED ORDER — FENTANYL CITRATE (PF) 100 MCG/2ML IJ SOLN
INTRAMUSCULAR | Status: AC
  Administered 2020-10-23: 25 ug via INTRAVENOUS
  Filled 2020-10-23: qty 2

## 2020-10-23 MED ORDER — EPHEDRINE SULFATE 50 MG/ML IJ SOLN
INTRAMUSCULAR | Status: DC | PRN
  Administered 2020-10-23 (×3): 10 mg via INTRAVENOUS

## 2020-10-23 MED ORDER — GABAPENTIN 300 MG PO CAPS: 300.0000 mg | ORAL_CAPSULE | ORAL | Status: AC

## 2020-10-23 MED ORDER — CHLORHEXIDINE GLUCONATE CLOTH 2 % EX PADS: 6.0000 | MEDICATED_PAD | Freq: Once | CUTANEOUS | Status: DC

## 2020-10-23 MED ORDER — HYDROMORPHONE HCL 1 MG/ML IJ SOLN
INTRAMUSCULAR | Status: AC
  Filled 2020-10-23: qty 1

## 2020-10-23 MED ORDER — FENTANYL CITRATE (PF) 100 MCG/2ML IJ SOLN
25.0000 ug | INTRAMUSCULAR | Status: DC | PRN
  Administered 2020-10-23: 25 ug via INTRAVENOUS

## 2020-10-23 MED ORDER — LACTATED RINGERS IV SOLN: INTRAVENOUS | Status: DC | PRN

## 2020-10-23 MED ORDER — LORAZEPAM 1 MG PO TABS
1.0000 mg | ORAL_TABLET | Freq: Three times a day (TID) | ORAL | Status: DC
  Administered 2020-10-23 – 2020-10-27 (×11): 1 mg via ORAL
  Filled 2020-10-23 (×11): qty 1

## 2020-10-23 MED ORDER — PROPOFOL 10 MG/ML IV BOLUS
INTRAVENOUS | Status: DC | PRN
  Administered 2020-10-23: 200 mg via INTRAVENOUS

## 2020-10-23 MED ORDER — LEVOTHYROXINE SODIUM 50 MCG PO TABS
75.0000 ug | ORAL_TABLET | Freq: Every day | ORAL | Status: DC
  Administered 2020-10-24 – 2020-10-27 (×4): 75 ug via ORAL
  Filled 2020-10-23 (×4): qty 1

## 2020-10-23 MED ORDER — QUETIAPINE FUMARATE 200 MG PO TABS
600.0000 mg | ORAL_TABLET | Freq: Every day | ORAL | Status: DC
  Administered 2020-10-23 – 2020-10-26 (×4): 600 mg via ORAL
  Filled 2020-10-23 (×5): qty 3

## 2020-10-23 MED ORDER — ONDANSETRON 4 MG PO TBDP
4.0000 mg | ORAL_TABLET | Freq: Four times a day (QID) | ORAL | Status: DC | PRN
  Filled 2020-10-23: qty 1

## 2020-10-23 MED ORDER — FENTANYL CITRATE (PF) 100 MCG/2ML IJ SOLN
INTRAMUSCULAR | Status: DC | PRN
  Administered 2020-10-23: 100 ug via INTRAVENOUS

## 2020-10-23 MED ORDER — ONDANSETRON HCL 4 MG/2ML IJ SOLN: 4.0000 mg | Freq: Four times a day (QID) | INTRAMUSCULAR | Status: DC | PRN

## 2020-10-23 MED ORDER — PROPOFOL 500 MG/50ML IV EMUL
INTRAVENOUS | Status: AC
  Filled 2020-10-23: qty 50

## 2020-10-23 MED ORDER — CEFAZOLIN SODIUM-DEXTROSE 2-4 GM/100ML-% IV SOLN
INTRAVENOUS | Status: AC
  Filled 2020-10-23: qty 100

## 2020-10-23 MED ORDER — TAMSULOSIN HCL 0.4 MG PO CAPS
0.4000 mg | ORAL_CAPSULE | Freq: Every day | ORAL | Status: DC
  Administered 2020-10-24 – 2020-10-27 (×4): 0.4 mg via ORAL
  Filled 2020-10-23 (×4): qty 1

## 2020-10-23 MED ORDER — OXYCODONE HCL 5 MG PO TABS
5.0000 mg | ORAL_TABLET | ORAL | Status: DC | PRN
  Administered 2020-10-23 – 2020-10-25 (×4): 5 mg via ORAL
  Administered 2020-10-25: 10 mg via ORAL
  Filled 2020-10-23 (×3): qty 1
  Filled 2020-10-23: qty 2
  Filled 2020-10-23: qty 1

## 2020-10-23 MED ORDER — VISTASEAL 10 ML SINGLE DOSE KIT
PACK | CUTANEOUS | Status: DC | PRN
  Administered 2020-10-23: 10 mL via TOPICAL

## 2020-10-23 MED ORDER — GLYCOPYRROLATE 0.2 MG/ML IJ SOLN
INTRAMUSCULAR | Status: DC | PRN
  Administered 2020-10-23: .2 mg via INTRAVENOUS

## 2020-10-23 MED ORDER — MIDAZOLAM HCL 2 MG/2ML IJ SOLN
INTRAMUSCULAR | Status: AC
  Filled 2020-10-23: qty 2

## 2020-10-23 MED ORDER — OLANZAPINE 5 MG PO TABS
15.0000 mg | ORAL_TABLET | Freq: Every day | ORAL | Status: DC
  Administered 2020-10-23 – 2020-10-26 (×4): 15 mg via ORAL
  Filled 2020-10-23 (×4): qty 3

## 2020-10-23 MED ORDER — PREGABALIN 50 MG PO CAPS
50.0000 mg | ORAL_CAPSULE | Freq: Three times a day (TID) | ORAL | Status: DC
  Administered 2020-10-23 – 2020-10-27 (×12): 50 mg via ORAL
  Filled 2020-10-23 (×12): qty 1

## 2020-10-23 MED ORDER — GABAPENTIN 300 MG PO CAPS
ORAL_CAPSULE | ORAL | Status: AC
  Administered 2020-10-23: 300 mg via ORAL
  Filled 2020-10-23: qty 1

## 2020-10-23 MED ORDER — MECLIZINE HCL 25 MG PO TABS
25.0000 mg | ORAL_TABLET | Freq: Three times a day (TID) | ORAL | Status: DC | PRN
  Filled 2020-10-23: qty 1

## 2020-10-23 MED ORDER — ROCURONIUM BROMIDE 100 MG/10ML IV SOLN
INTRAVENOUS | Status: DC | PRN
  Administered 2020-10-23: 50 mg via INTRAVENOUS
  Administered 2020-10-23 (×2): 30 mg via INTRAVENOUS
  Administered 2020-10-23: 20 mg via INTRAVENOUS
  Administered 2020-10-23: 30 mg via INTRAVENOUS

## 2020-10-23 MED ORDER — DEXMEDETOMIDINE (PRECEDEX) IN NS 20 MCG/5ML (4 MCG/ML) IV SYRINGE
PREFILLED_SYRINGE | INTRAVENOUS | Status: DC | PRN
  Administered 2020-10-23: 8 ug via INTRAVENOUS
  Administered 2020-10-23: 12 ug via INTRAVENOUS

## 2020-10-23 MED ORDER — PROCHLORPERAZINE MALEATE 10 MG PO TABS
10.0000 mg | ORAL_TABLET | Freq: Four times a day (QID) | ORAL | Status: DC | PRN
  Filled 2020-10-23: qty 1

## 2020-10-23 MED ORDER — LITHIUM CARBONATE 300 MG PO CAPS
600.0000 mg | ORAL_CAPSULE | Freq: Every day | ORAL | Status: DC
  Administered 2020-10-24 – 2020-10-26 (×3): 600 mg via ORAL
  Filled 2020-10-23 (×5): qty 2

## 2020-10-23 MED ORDER — IPRATROPIUM-ALBUTEROL 0.5-2.5 (3) MG/3ML IN SOLN: 3.0000 mL | RESPIRATORY_TRACT | Status: DC

## 2020-10-23 MED ORDER — SODIUM CHLORIDE 0.9 % IV SOLN
INTRAVENOUS | Status: DC | PRN
  Administered 2020-10-23: 30 ug/min via INTRAVENOUS

## 2020-10-23 MED ORDER — VASOPRESSIN 20 UNIT/ML IV SOLN
INTRAVENOUS | Status: DC | PRN
  Administered 2020-10-23 (×2): 2 [IU] via INTRAVENOUS
  Administered 2020-10-23 (×2): 4 [IU] via INTRAVENOUS

## 2020-10-23 MED ORDER — SCOPOLAMINE 1 MG/3DAYS TD PT72
MEDICATED_PATCH | TRANSDERMAL | Status: AC
  Filled 2020-10-23: qty 1

## 2020-10-23 MED ORDER — ONDANSETRON HCL 4 MG/2ML IJ SOLN
INTRAMUSCULAR | Status: DC | PRN
  Administered 2020-10-23 (×2): 4 mg via INTRAVENOUS

## 2020-10-23 MED ORDER — ONDANSETRON HCL 4 MG/2ML IJ SOLN: 4.0000 mg | Freq: Once | INTRAMUSCULAR | Status: DC | PRN

## 2020-10-23 MED ORDER — FENTANYL CITRATE (PF) 100 MCG/2ML IJ SOLN
INTRAMUSCULAR | Status: AC
  Filled 2020-10-23: qty 2

## 2020-10-23 MED ORDER — CEFAZOLIN SODIUM-DEXTROSE 2-4 GM/100ML-% IV SOLN
2.0000 g | Freq: Three times a day (TID) | INTRAVENOUS | Status: AC
  Administered 2020-10-23 – 2020-10-24 (×2): 2 g via INTRAVENOUS
  Filled 2020-10-23 (×3): qty 100

## 2020-10-23 MED ORDER — BUPIVACAINE LIPOSOME 1.3 % IJ SUSP
INTRAMUSCULAR | Status: AC
  Filled 2020-10-23: qty 20

## 2020-10-23 MED ORDER — FLUOXETINE HCL 20 MG PO CAPS
40.0000 mg | ORAL_CAPSULE | Freq: Every day | ORAL | Status: DC
  Administered 2020-10-24 – 2020-10-27 (×4): 40 mg via ORAL
  Filled 2020-10-23 (×5): qty 2

## 2020-10-23 SURGICAL SUPPLY — 66 items
"PENCIL ELECTRO HAND CTR " (MISCELLANEOUS) ×1 IMPLANT
ADH SKN CLS APL DERMABOND .7 (GAUZE/BANDAGES/DRESSINGS) ×1
APL LAPSCP 35 DL APL RGD (MISCELLANEOUS) ×1
APL PRP STRL LF DISP 70% ISPRP (MISCELLANEOUS) ×1
APPLICATOR VISTASEAL 35 (MISCELLANEOUS) ×2 IMPLANT
BLADE CLIPPER SURG (BLADE) ×2 IMPLANT
CANNULA REDUC XI 12-8 STAPL (CANNULA) ×2
CANNULA REDUC XI 12-8MM STAPL (CANNULA) ×1
CANNULA REDUCER 12-8 DVNC XI (CANNULA) ×1 IMPLANT
CHLORAPREP W/TINT 26 (MISCELLANEOUS) ×3 IMPLANT
COVER WAND RF STERILE (DRAPES) ×3 IMPLANT
DECANTER SPIKE VIAL GLASS SM (MISCELLANEOUS) ×3 IMPLANT
DEFOGGER SCOPE WARMER CLEARIFY (MISCELLANEOUS) ×3 IMPLANT
DERMABOND ADVANCED (GAUZE/BANDAGES/DRESSINGS) ×2
DERMABOND ADVANCED .7 DNX12 (GAUZE/BANDAGES/DRESSINGS) ×1 IMPLANT
DRAPE 3/4 80X56 (DRAPES) ×3 IMPLANT
DRAPE ARM DVNC X/XI (DISPOSABLE) ×4 IMPLANT
DRAPE COLUMN DVNC XI (DISPOSABLE) ×1 IMPLANT
DRAPE DA VINCI XI ARM (DISPOSABLE) ×12
DRAPE DA VINCI XI COLUMN (DISPOSABLE) ×3
ELECT CAUTERY BLADE 6.4 (BLADE) ×3 IMPLANT
ELECT REM PT RETURN 9FT ADLT (ELECTROSURGICAL) ×3
ELECTRODE REM PT RTRN 9FT ADLT (ELECTROSURGICAL) ×1 IMPLANT
GLOVE SURG ENC MOIS LTX SZ7 (GLOVE) ×9 IMPLANT
GOWN STRL REUS W/ TWL LRG LVL3 (GOWN DISPOSABLE) ×4 IMPLANT
GOWN STRL REUS W/TWL LRG LVL3 (GOWN DISPOSABLE) ×12
GRASPER LAPSCPC 5X45 DSP (INSTRUMENTS) ×3 IMPLANT
IRRIGATION STRYKERFLOW (MISCELLANEOUS) IMPLANT
IRRIGATOR STRYKERFLOW (MISCELLANEOUS) ×3
IV NS 1000ML (IV SOLUTION) ×3
IV NS 1000ML BAXH (IV SOLUTION) IMPLANT
KIT PINK PAD W/HEAD ARE REST (MISCELLANEOUS) ×3
KIT PINK PAD W/HEAD ARM REST (MISCELLANEOUS) ×1 IMPLANT
KIT TURNOVER CYSTO (KITS) ×3 IMPLANT
LABEL OR SOLS (LABEL) ×3 IMPLANT
MANIFOLD NEPTUNE II (INSTRUMENTS) ×3 IMPLANT
MESH BIO-A 7X10 SYN MAT (Mesh General) ×2 IMPLANT
MESH VENTRALEX ST 2.5 CRC MED (Mesh General) ×2 IMPLANT
NEEDLE HYPO 22GX1.5 SAFETY (NEEDLE) ×3 IMPLANT
OBTURATOR OPTICAL STANDARD 8MM (TROCAR) ×3
OBTURATOR OPTICAL STND 8 DVNC (TROCAR) ×1
OBTURATOR OPTICALSTD 8 DVNC (TROCAR) ×1 IMPLANT
PACK LAP CHOLECYSTECTOMY (MISCELLANEOUS) ×3 IMPLANT
PENCIL ELECTRO HAND CTR (MISCELLANEOUS) ×3 IMPLANT
SEAL CANN UNIV 5-8 DVNC XI (MISCELLANEOUS) ×3 IMPLANT
SEAL XI 5MM-8MM UNIVERSAL (MISCELLANEOUS) ×9
SEALER VESSEL DA VINCI XI (MISCELLANEOUS) ×3
SEALER VESSEL EXT DVNC XI (MISCELLANEOUS) ×1 IMPLANT
SOLUTION ELECTROLUBE (MISCELLANEOUS) ×3 IMPLANT
SPONGE LAP 18X18 RF (DISPOSABLE) ×3 IMPLANT
STAPLER CANNULA SEAL DVNC XI (STAPLE) ×1 IMPLANT
STAPLER CANNULA SEAL XI (STAPLE) ×3
SUT ETHIBOND NAB MO 7 #0 18IN (SUTURE) ×4 IMPLANT
SUT MNCRL 4-0 (SUTURE) ×3
SUT MNCRL 4-0 27XMFL (SUTURE) ×1
SUT SILK 2 0 SH (SUTURE) ×7 IMPLANT
SUT VICRYL 0 AB UR-6 (SUTURE) ×6 IMPLANT
SUT VLOC 90 S/L VL9 GS22 (SUTURE) ×5 IMPLANT
SUTURE MNCRL 4-0 27XMF (SUTURE) ×1 IMPLANT
SYR 20ML LL LF (SYRINGE) ×3 IMPLANT
SYR 30ML LL (SYRINGE) ×3 IMPLANT
TAPE TRANSPORE STRL 2 31045 (GAUZE/BANDAGES/DRESSINGS) ×3 IMPLANT
TRAY FOLEY SLVR 16FR LF STAT (SET/KITS/TRAYS/PACK) ×3 IMPLANT
TROCAR BALLN GELPORT 12X130M (ENDOMECHANICALS) ×3 IMPLANT
TROCAR XCEL NON-BLD 5MMX100MML (ENDOMECHANICALS) ×3 IMPLANT
TUBING EVAC SMOKE HEATED PNEUM (TUBING) ×3 IMPLANT

## 2020-10-23 NOTE — Transfer of Care (Signed)
Immediate Anesthesia Transfer of Care Note  Patient: Albert [name]  Procedure(s) Performed: XI ROBOTIC ASSISTED PARAESOPHAGEAL HERNIA REPAIR WITH MESH AND INCISIONAL HERNIA REPAIR WITH MESH  Patient Location: PACU  Anesthesia Type:General  Level of Consciousness: awake, drowsy and patient cooperative  Airway & Oxygen Therapy: Patient Spontanous Breathing and Patient connected to face mask oxygen  Post-op Assessment: Report given to RN and Post -op Vital signs reviewed and stable, breathing treatment to be ordered and given TFH  Post vital signs: Reviewed and stable  Last Vitals:  Vitals Value Taken Time  BP 99/73 10/23/20 1531  Temp 36.2 C 10/23/20 1530  Pulse 81 10/23/20 1536  Resp 24 10/23/20 1536  SpO2 89 % 10/23/20 1536  Vitals shown include unvalidated device data.  Last Pain:  Vitals:   10/23/20 0910  TempSrc: Temporal  PainSc: 5          Complications: No notable events documented.

## 2020-10-23 NOTE — Interval H&P Note (Signed)
History and Physical Interval Note:  10/23/2020 9:01 AM  Albert [name]  has presented today for surgery, with the diagnosis of hiatal hernia.  The various methods of treatment have been discussed with the patient and family. After consideration of risks, benefits and other options for treatment, the patient has consented to  Procedure(s): XI ROBOTIC ASSISTED PARAESOPHAGEAL HERNIA REPAIR WITH INCISIONAL HERNIA REPAIR (N/A) as a surgical intervention.  The patient's history has been reviewed, patient examined, no change in status, stable for surgery.  I have reviewed the patient's chart and labs.  Questions were answered to the patient's satisfaction.     Albert [name]

## 2020-10-23 NOTE — Anesthesia Preprocedure Evaluation (Signed)
Anesthesia Evaluation  Patient identified by MRN, date of birth, ID band Patient awake    Reviewed: Allergy & Precautions, NPO status , Patient's Chart, lab work & pertinent test results  History of Anesthesia Complications (+) PONV and history of anesthetic complications  Airway Mallampati: II       Dental  (+) Poor Dentition, Missing, Chipped   Pulmonary neg sleep apnea, neg COPD, Not current smoker, former smoker,           Cardiovascular hypertension, Pt. on medications (-) Past MI and (-) CHF (-) dysrhythmias (-) Valvular Problems/Murmurs     Neuro/Psych neg Seizures Anxiety Depression Bipolar Disorder    GI/Hepatic Neg liver ROS, hiatal hernia, GERD  Medicated,  Endo/Other  neg diabetesHypothyroidism   Renal/GU Renal InsufficiencyRenal disease     Musculoskeletal   Abdominal   Peds  Hematology   Anesthesia Other Findings   Reproductive/Obstetrics                             Anesthesia Physical Anesthesia Plan  ASA: 3  Anesthesia Plan: General   Post-op Pain Management:    Induction: Intravenous  PONV Risk Score and Plan: 3 and Ondansetron, Aprepitant, Dexamethasone and Propofol infusion  Airway Management Planned: Oral ETT  Additional Equipment:   Intra-op Plan:   Post-operative Plan:   Informed Consent: I have reviewed the patients History and Physical, chart, labs and discussed the procedure including the risks, benefits and alternatives for the proposed anesthesia with the patient or authorized representative who has indicated his/her understanding and acceptance.       Plan Discussed with:   Anesthesia Plan Comments:         Anesthesia Quick Evaluation

## 2020-10-23 NOTE — Progress Notes (Signed)
Arrived to PACU with labored breathing on 6L O2 via face mask. Lung sounds clear. Neb tx given per Dr Henrene Hawking. No productive cough. O2 sats 91%

## 2020-10-23 NOTE — Progress Notes (Signed)
   10/23/20 1940  Clinical Encounter Type  Visited With Family  Visit Type Initial  Referral From Other (Comment)  Recommendations spiritual care follow-up  Spiritual Encounters  Spiritual Needs Emotional;Prayer  Chaplain Sophronia Simas was approached by PT's mother in hallway. Chaplain Burris provided compassionate, non-anxious presence and reflective listening for her concerns. PT's mother coping with anxious feelings around hospitalization due to prior experience. Chaplain Burris offered supportive presence and offered prayer. Will also recommend follow-up pastoral care to support family as Mr. Bruneau recovers from surgery.

## 2020-10-23 NOTE — Anesthesia Procedure Notes (Signed)
Procedure Name: Intubation Date/Time: 10/23/2020 10:54 AM Performed by: Mohammed Kindle, CRNA Pre-anesthesia Checklist: Patient identified, Emergency Drugs available, Suction available and Patient being monitored Patient Re-evaluated:Patient Re-evaluated prior to induction Oxygen Delivery Method: Circle system utilized Preoxygenation: Pre-oxygenation with 100% oxygen Induction Type: IV induction Ventilation: Oral airway inserted - appropriate to patient size Laryngoscope Size: McGraph Grade View: Grade I Tube size: 7.0 mm Number of attempts: 1 Airway Equipment and Method: Stylet Placement Confirmation: ETT inserted through vocal cords under direct vision, positive ETCO2, CO2 detector and breath sounds checked- equal and bilateral Secured at: 21 cm Tube secured with: Tape Dental Injury: Teeth and Oropharynx as per pre-operative assessment

## 2020-10-23 NOTE — Op Note (Signed)
Robotic assisted laparoscopic Nissen fundoplication w repair of  Recurrent paraesophageal hernia with Mesh Bio-A 10x7cm Incisional hernia repair using 6.4 cm ventralex patch  Pre-operative Diagnosis: GERD, hiatal hernia  Post-operative Diagnosis: same  Procedure:  Robotic assisted laparoscopic Nissen fundoplication w repair of  Recurrent paraesophageal hernia Incisional hernia repair using 6.4 cm ventralex patch  Surgeon: Sterling Big, MD FACS  Assistant: Laqueta Due PA-C. [name] Required due to the complexity of the case the need for exposure and lack of first assist.  Anesthesia: Gen. with endotracheal tube  Findings: Recurrent type III paraesophageal hernia Dense adhesions within mediastinum and hiatus due to prior repair Completely undone Prior Nissen fundoplication Loose wrap 360 degree over 50 FR Bougie   Estimated Blood Loss: 50cc             Complications: none   Procedure Details  The patient was seen again in the Holding Room. The benefits, complications, treatment options, and expected outcomes were discussed with the patient. The risks of bleeding, infection, recurrence of symptoms, failure to resolve symptoms,  esophageal damage, Dysphagia, bowel injury, any of which could require further surgery were reviewed with the patient. The likelihood of improving the patient's symptoms with return to their baseline status is good.  The patient and/or family concurred with the proposed plan, giving informed consent.  The patient was taken to Operating Room, identified  and the procedure verified.  A Time Out was held and the above information confirmed.  Prior to the induction of general anesthesia, antibiotic prophylaxis was administered. VTE prophylaxis was in place. General endotracheal anesthesia was then administered and tolerated well. After the induction, the abdomen was prepped with Chloraprep and draped in the sterile fashion. The patient was positioned in the supine  position.   Optiview was used to entered the abdominal cavity, Left upper quadrant  Pneumoperitoneum was then created with CO2 and tolerated well without any adverse changes in the patient's vital signs.  No injuries observed. Three 8-mm ports were placed under direct vision. All skin incisions  were infiltrated with a local anesthetic agent before making the incision and placing the trocars. An additional 5 mm regular laparoscopic port was placed to assist with retraction and exposure.   The patient was positioned  in reverse Trendelenburg, robot was brought to the surgical field and docked in the standard fashion.  We made sure all the instrumentation was kept indirect view at all times and that there were no collision between the arms. I scrubbed out and went to the console.  I used a attic arm to retract the liver, the vessel sealer on my right hand and a forced bipolar grasper on my left hand.  There is along the extra 5 mm port allow me ample exposure and the ability to perform meticulous dissection  We Started dividing the lesser omentum via the pars flaccida.   There was dense adhesions around the hiatus and within the mediastinum due to its recurrent nature of the disease. The Fundoplication was completely undone.  Meticulous dissection performed until we were able to restore the anatomy. THis was a complex case due to the severity of adhesions.   We Were able to dissect the lesser curvature of the stomach and  dissected the fundus free from the right and left crus.  We circumferentially dissected the GE junction.  The hernia sac was also completely reduced and we were able to bring the stomach into the intra-abdominal position.    Attention then was turned to  the greater curvature where the short gastrics were divided with sealer device.  We were able to identify the left crus and again were able to make sure there was a good circumferential dissection and that the hernia sac was completely  excised.  We did perform a good dissection within the mediastinum to allow a complete reduction of the sac and a to completely allow an intra-abdominal Nissen fundoplication.  2-0V lock suture was inserted and the crus as well as the hernia was closed with a running suture using two strips on each side of the crus as a pledget. Bio-A was placed posterior to the esophagus and Vistalseal was used to secure it to the right and left crus/.  We Asked anesthesia to place a 50 French bougie and this went easily.  We also observe trajectory of the bougie. 360 degree Nissen fundoplication was created with multiple 2-0 silk sutures and we placed 3 stitches taking some of the esophagus within that bite.  The fundoplication measured approximately 3-1/2 cm and he was floppy. I was very happy with the way the fundoplication laid and the repair of the hernia.  Is a small traction laceration within the liver that measure about 2 mm and we were able to cauterize it appropriately.   Inspection of the  upper quadrant was performed. No bleeding, bile  Or esophageal injuries leaks, or bowel injuries were noted. Robotic instruments and robotic arms were undocked in the standard fashion. All the needles were removed under direct visualization.   I scrubbed back in.  Pneumoperitoneum was released.   Attention was turned to the incisional hernia, we dissected the sac and removed it. We cleaned the edges of the fascia and secured the mesh in 4 quadrants . THe defect was closed with multiple 0 ethibond sutures in the standard fashion. 4-0 subcuticular Monocryl was used to close the skin. Liposomal marcaine was injected to all the incisions sites.  Dermabond was  applied.  The patient was then extubated and brought to the recovery room in stable condition. Sponge, lap, and needle counts were correct at closure and at the conclusion of the case.               Sterling Big, MD, FACS

## 2020-10-24 ENCOUNTER — Inpatient Hospital Stay: Payer: Medicare PPO

## 2020-10-24 ENCOUNTER — Encounter: Payer: Self-pay | Admitting: Surgery

## 2020-10-24 LAB — BASIC METABOLIC PANEL
Anion gap: 10 (ref 5–15)
BUN: 23 mg/dL — ABNORMAL HIGH (ref 6–20)
CO2: 19 mmol/L — ABNORMAL LOW (ref 22–32)
Calcium: 8.7 mg/dL — ABNORMAL LOW (ref 8.9–10.3)
Chloride: 109 mmol/L (ref 98–111)
Creatinine, Ser: 1.75 mg/dL — ABNORMAL HIGH (ref 0.61–1.24)
GFR, Estimated: 45 mL/min — ABNORMAL LOW (ref >60)
Glucose, Bld: 136 mg/dL — ABNORMAL HIGH (ref 70–99)
Potassium: 4.3 mmol/L (ref 3.5–5.1)
Sodium: 138 mmol/L (ref 135–145)

## 2020-10-24 LAB — CBC
HCT: 37.1 % — ABNORMAL LOW (ref 39.0–52.0)
Hemoglobin: 11.9 g/dL — ABNORMAL LOW (ref 13.0–17.0)
MCH: 31.6 pg (ref 26.0–34.0)
MCHC: 32.1 g/dL (ref 30.0–36.0)
MCV: 98.7 fL (ref 80.0–100.0)
Platelets: 328 10*3/uL (ref 150–400)
RBC: 3.76 MIL/uL — ABNORMAL LOW (ref 4.22–5.81)
RDW: 13.1 % (ref 11.5–15.5)
WBC: 12.3 10*3/uL — ABNORMAL HIGH (ref 4.0–10.5)
nRBC: 0 % (ref 0.0–0.2)

## 2020-10-24 MED ORDER — ONDANSETRON HCL 4 MG/2ML IJ SOLN
4.0000 mg | Freq: Four times a day (QID) | INTRAMUSCULAR | Status: DC
  Administered 2020-10-24 – 2020-10-26 (×9): 4 mg via INTRAVENOUS
  Filled 2020-10-24 (×9): qty 2

## 2020-10-24 MED ORDER — ENSURE ENLIVE PO LIQD
237.0000 mL | Freq: Two times a day (BID) | ORAL | Status: DC
  Administered 2020-10-24 – 2020-10-27 (×6): 237 mL via ORAL

## 2020-10-24 MED ORDER — PREGABALIN 50 MG PO CAPS: 50.0000 mg | ORAL_CAPSULE | Freq: Three times a day (TID) | ORAL | 0 refills | Status: DC

## 2020-10-24 MED ORDER — OXYCODONE HCL 5 MG PO TABS: 5.0000 mg | ORAL_TABLET | ORAL | 0 refills | Status: DC | PRN

## 2020-10-24 MED ORDER — PROPOFOL 10 MG/ML IV BOLUS
INTRAVENOUS | Status: AC
  Filled 2020-10-24: qty 20

## 2020-10-24 NOTE — TOC Progression Note (Signed)
Transition of Care [facility]) - Progression Note    Patient Details  Name: FRANCISCO OSTROVSKY MRN: [id number] Date of Birth: [birth year]  [facility] of [facility]) CM/SW Contact  Caryn Section, RN Phone Number: 10/24/2020, 10:15 AM  Clinical Narrative:   Patient lives at home with his parents, states he has assistance at home if needed.  Patient states there are folks that can help him to get to appointments and get medications if needed.  He has no concerns with taking medications as directed.  Patient does not currently have any home services.  TOC contact information given, TOC to follow to discharge.         Expected Discharge Plan and Services                                                 Social Determinants of Health (SDOH) Interventions    Readmission Risk Interventions No flowsheet data found.

## 2020-10-24 NOTE — Progress Notes (Signed)
Pt reported food feeling stuck mid esophagus and is also nauseated - did vomit a small amt and feels like he is choking on food - he did stick his fingers down his throat to induce vomit. Pt not in distress. MD made aware and orders placed. Pt notified of new dietary status- full liquid. Pt told to take small bites and be mind full while swallowing. Pt able to drink thin liquids okay and able to eat sherbet okay as well.

## 2020-10-24 NOTE — Progress Notes (Addendum)
Brief Progress Note Patient seen and examined Doing well, aside from significant abdominal soreness, worse with movement No nausea, emesis, heartburn, fever chills Tolerating full liquids  On Exam: BP 109/86 (BP Location: Left Arm)   Pulse 71   Temp (!) 97.4 F (36.3 C)   Resp 18   Ht 5\' 9"  (1.753 m)   Wt 88.5 kg   SpO2 100%   BMI 28.80 kg/m   Abdomen is soft, incisional soreness, mild distension, no rebound/guarding Laparoscopic incisions are CDI with dermabond, no erythema or drainage  Plan -- Advance to soft diet; Nissen handout provided -- Discontinue IVF -- Ambulate -- Pain control prn  -- Discharge Planning: Will reassess for potential discharge this afternoon vs tomorrow morning  -- , PA-C [facility] 10/24/2020, 8:40 AM [phone] M-F: 7am - 4pm   Pt see and examined.  I agree w Mr. [phone] [facility].Tolerated diey but had some dysphagia.AVSS, labs ok. Baseline [facility] Still having some pain CXr w some mediastinal fullness likely from extensive mediastinal dissection. We will order swallow study. No ready for DC

## 2020-10-24 NOTE — Anesthesia Postprocedure Evaluation (Signed)
Anesthesia Post Note  Patient: Albert [name]  Procedure(s) Performed: XI ROBOTIC ASSISTED PARAESOPHAGEAL HERNIA REPAIR WITH MESH AND INCISIONAL HERNIA REPAIR WITH MESH  Patient location during evaluation: PACU Anesthesia Type: General Level of consciousness: awake and alert Pain management: pain level controlled Vital Signs Assessment: post-procedure vital signs reviewed and stable Respiratory status: spontaneous breathing and respiratory function stable Cardiovascular status: stable Anesthetic complications: no   No notable events documented.   Last Vitals:  Vitals:   10/24/20 0522 10/24/20 0754  BP: 102/72 109/86  Pulse: 85 71  Resp: 18 18  Temp: (!) 36.4 C (!) 36.3 C  SpO2: 93% 100%    Last Pain:  Vitals:   10/24/20 0145  TempSrc:   PainSc: 0-No pain                 Cherre Kothari K

## 2020-10-24 NOTE — Discharge Instructions (Addendum)
In addition to included general post-operative instructions,  Diet: Follow Nissen diet recommendations/restrictions (handouts provided) for 4 weeks. No carbonation.    Activity: No heavy lifting >20 pounds (children, pets, laundry, garbage) for 6 weeks, but light activity and walking are encouraged. Do not drive or drink alcohol if taking narcotic pain medications or having pain that might distract from driving.  Wound care: 2 days after surgery (06/25), you may shower/get incision wet with soapy water and pat dry (do not rub incisions), but no baths or submerging incision underwater until follow-up.   Medications: Resume all home medications. For mild to moderate pain: acetaminophen (Tylenol) or ibuprofen/naproxen (if no kidney disease). Combining Tylenol with alcohol can substantially increase your risk of causing liver disease. Narcotic pain medications, if prescribed, can be used for severe pain, though may cause nausea, constipation, and drowsiness. Do not combine Tylenol and Percocet (or similar) within a 6 hour period as Percocet (and similar) contain(s) Tylenol. If you do not need the narcotic pain medication, you do not need to fill the prescription.  Call office [phone] / [phone]) at any time if any questions, worsening pain, fevers/chills, bleeding, drainage from incision site, or other concerns.

## 2020-10-25 NOTE — Progress Notes (Signed)
Patient ID: [name], male   DOB: 08/17/1965, 55 y.o.   MRN: [id number]     SURGICAL PROGRESS NOTE   Hospital Day(s): 2.   Post op day(s): 2 Days Post-Op.   Interval History: Patient seen and examined, no acute events or new complaints overnight. Patient reports not feeling great.  Reports significant pain on surgical area.  Pain 7 out of 10.  Pain slightly improved with pain medication.  Still has not been able to ambulate adequately.  Denies any nausea or vomiting.  Endorses tolerating full liquid diet.  Vital signs in last 24 hours: [min-max] current  Temp:  [98 F (36.7 C)-98.9 F (37.2 C)] 98.7 F (37.1 C) (06/25 0802) Pulse Rate:  [81-87] 87 (06/25 0802) Resp:  [17-18] 18 (06/25 0802) BP: (101-124)/(71-93) 114/86 (06/25 0802) SpO2:  [87 %-92 %] 92 % (06/25 0802)     Height: 5\' 9"  (175.3 cm) Weight: 88.5 kg BMI (Calculated): 28.78   Physical Exam:  Constitutional: alert, cooperative and no distress  Respiratory: breathing non-labored at rest  Cardiovascular: regular rate and sinus rhythm  Gastrointestinal: soft, non-tender, and non-distended  Labs:  CBC Latest Ref Rng & Units 10/24/2020 10/23/2020 08/11/2020  WBC 4.0 - 10.5 K/uL 12.3(H) 14.3(H) 9.3  Hemoglobin 13.0 - 17.0 g/dL 11.9(L) 11.1(L) 13.9  Hematocrit 39.0 - 52.0 % 37.1(L) 34.9(L) 42.1  Platelets 150 - 400 K/uL 328 349 474(H)   CMP Latest Ref Rng & Units 10/24/2020 10/23/2020 08/11/2020  Glucose 70 - 99 mg/dL 10/11/2020) - 379(K)  BUN 6 - 20 mg/dL 240(X) - 18  Creatinine 0.61 - 1.24 mg/dL 73(Z) 3.29(J) 2.42(A)  Sodium 135 - 145 mmol/L 138 - 135  Potassium 3.5 - 5.1 mmol/L 4.3 - 4.0  Chloride 98 - 111 mmol/L 109 - 104  CO2 22 - 32 mmol/L 19(L) - 21(L)  Calcium 8.9 - 10.3 mg/dL 8.34(H) - 9.9  Total Protein 6.5 - 8.1 g/dL - - 7.8  Total Bilirubin 0.3 - 1.2 mg/dL - - 0.5  Alkaline Phos 38 - 126 U/L - - 34(L)  AST 15 - 41 U/L - - 23  ALT 0 - 44 U/L - - 16    Imaging studies: No new pertinent imaging  studies   Assessment/Plan:  55 y.o. male with recurrent paraesophageal hernia and incisional hernia 2 Days Post-Op s/p robotic assisted laparoscopic Nissen fundoplication with repair of recurrent paraesophageal hernia and incisional hernia repair, complicated by pertinent comorbidities including bipolar disorder, GERD.  Patient is recovering slowly but adequately.  Vital signs are stable.  No fever, no tachycardia.  No sign of complication at this moment.  Still too uncomfortable with significant pain.  Pain not been able to be controlled with oral pain medication.  Patient still ambulating adequately.  We will continue with aggressive pain management.  I encouraged the patient to ambulate.  We will recommend physical therapy for evaluation of postop weakness.  Continue with DVT prophylaxis.  We will continue with full liquid diet.  56, MD

## 2020-10-25 NOTE — Progress Notes (Signed)
Mobility Specialist - Progress Note   10/25/20 1400  Mobility  Activity Ambulated in hall;Ambulated to bathroom  Level of Assistance Standby assist, set-up cues, supervision of patient - no hands on  Assistive Device None  Distance Ambulated (ft) 160 ft  Mobility Ambulated with assistance in hallway  Mobility Response Tolerated well  Mobility performed by Mobility specialist  $Mobility charge 1 Mobility    During mobility: 109 HR, 83% SpO2 Post-mobility: 103 HR, 94% SpO2   Pt ambulated to bathroom prior to ambulation in hallway. Seated in recliner upon arrival, no assist to stand. Denied dizziness upon standing. Supervised ambulation with CGA. No LOB, but does sway occasionally. SOB on RA during activity, PLB engaged. O2 desat to a low of 83% during ambulation, but quickly rebounded to 91% with short rest break. Fatigued post-activity. Pt left in recliner, alarm set.    Filiberto Pinks Mobility Specialist 10/25/20, 2:28 PM

## 2020-10-25 NOTE — Evaluation (Signed)
Physical Therapy Evaluation Patient Details Name: Albert [name] MRN: [id number] DOB: 1965/12/31 Today's Date: 10/25/2020   History of Present Illness  admitted for acute hospitalization s/p laparascopic repair of paraesophageal hernia with incisional hernia repair (6.4cm) (10/23/20)  Clinical Impression  Therapist responded to alarm in room, noted patient up in bathroom upon arrival to room.  Alert and oriented to basic information, but does appear mildly confused.  Follows commands; reoriented to situation and need for assist with all transfers. Bilat UE/LE strength and ROM grossly symmetrical and WFL; no focal weakness appreciated.  Able to complete bed mobility with close sup; sit/stand, basic transfers and gait (200') without assist device, cga/close sup.  Demonstrates reciprocal stepping pattern with fair step height/length; decreased cadence and overall gait speed (10' walk time, 12 seconds); limited trunk rotation and arm swing; mild sway with head turns, but self-corrects without difficulty.  Mild/mod SOB with exertion; sats 87-88% after gait trial, improving to >90% with 30 seconds of seated rest.  Will continue to monitor in subsequent sessions. Would benefit from skilled PT to address above deficits and promote optimal return to PLOF.;Recommend transition to HHPT upon discharge from acute hospitalization.     Follow Up Recommendations Home health PT    Equipment Recommendations       Recommendations for Other Services       Precautions / Restrictions Precautions Precautions: Fall Restrictions Weight Bearing Restrictions: No      Mobility  Bed Mobility Overal bed mobility: Needs Assistance Bed Mobility: Supine to Sit     Supine to sit: Supervision     General bed mobility comments: cuing for log rolling technique    Transfers Overall transfer level: Needs assistance Equipment used: None Transfers: Sit to/from Stand Sit to Stand: Min guard;Supervision             Ambulation/Gait Ambulation/Gait assistance: Min Emergency planning/management officer (Feet): 200 Feet Assistive device: None   Gait velocity: 10' walk time, 12 seconds   General Gait Details: reciprocal stepping pattern with fair step height/length; decreased cadence and overall gait speed; limited trunk rotation and arm swing; mild sway with head turns, but self-corrects without difficulty  Stairs            Wheelchair Mobility    Modified Rankin (Stroke Patients Only)       Balance Overall balance assessment: Needs assistance Sitting-balance support: No upper extremity supported;Feet supported Sitting balance-Leahy Scale: Good     Standing balance support: No upper extremity supported Standing balance-Leahy Scale: Fair                               Pertinent Vitals/Pain Pain Assessment: No/denies pain    Home Living Family/patient expects to be discharged to:: Private residence Living Arrangements: Alone Available Help at Discharge: Family Type of Home: House Home Access: Stairs to enter Entrance Stairs-Rails: None Entrance Stairs-Number of Steps: 2-3 Home Layout: One level Home Equipment: None Additional Comments: Planning to discharge to mother's home immediately upon discharge, single-story with 3-4 steps to enter/exit    Prior Function Level of Independence: Independent         Comments: Indep with ADLs, household and community mobilization without assist device; denies fall history.     Hand Dominance        Extremity/Trunk Assessment   Upper Extremity Assessment Upper Extremity Assessment: Overall WFL for tasks assessed    Lower Extremity Assessment Lower Extremity Assessment: Overall [facility]  for tasks assessed (grossly 4+/5 throughout. R > L LE intermittently tremulous at rest)       Communication      Cognition Arousal/Alertness: Awake/alert Behavior During Therapy: WFL for tasks assessed/performed Overall Cognitive Status:  Within Functional Limits for tasks assessed                                        General Comments      Exercises     Assessment/Plan    PT Assessment Patient needs continued PT services  PT Problem List Decreased strength;Decreased range of motion;Decreased activity tolerance;Decreased balance;Decreased mobility;Decreased cognition;Decreased knowledge of use of DME;Decreased safety awareness;Decreased knowledge of precautions;Decreased skin integrity       PT Treatment Interventions DME instruction;Gait training;Stair training;Functional mobility training;Therapeutic activities;Balance training;Patient/family education;Therapeutic exercise    PT Goals (Current goals can be found in the Care Plan section)  Acute Rehab PT Goals Patient Stated Goal: to return home with parents PT Goal Formulation: With patient Time For Goal Achievement: 11/08/20 Potential to Achieve Goals: Good    Frequency Min 2X/week   Barriers to discharge        Co-evaluation               AM-PAC PT "6 Clicks" Mobility  Outcome Measure Help needed turning from your back to your side while in a flat bed without using bedrails?: None Help needed moving from lying on your back to sitting on the side of a flat bed without using bedrails?: None Help needed moving to and from a bed to a chair (including a wheelchair)?: A Little Help needed standing up from a chair using your arms (e.g., wheelchair or bedside chair)?: A Little Help needed to walk in hospital room?: A Little Help needed climbing 3-5 steps with a railing? : A Little 6 Click Score: 20    End of Session Equipment Utilized During Treatment: Gait belt Activity Tolerance: Patient tolerated treatment well Patient left: in chair;with call bell/phone within reach;with chair alarm set Nurse Communication: Mobility status PT Visit Diagnosis: Muscle weakness (generalized) (M62.81);Difficulty in walking, not elsewhere classified  (R26.2)    Time: 3704-8889 PT Time Calculation (min) (ACUTE ONLY): 18 min   Charges:   PT Evaluation $PT Eval Moderate Complexity: 1 Mod          Jakyiah Briones H. Manson Passey, PT, DPT, NCS 10/25/20, 1:35 PM [phone]

## 2020-10-26 NOTE — Progress Notes (Signed)
Patient ID: Albert [name], male   DOB: November 04, 1965, 55 y.o.   MRN: [id number]     SURGICAL PROGRESS NOTE   Hospital Day(s): 3.   Interval History: Patient seen and examined, no acute events or new complaints overnight. Patient reports still very uncomfortable unable to move well.  Still with significant pain unable to be controlled with oral pain medications.  Denies nausea or vomiting.  Denies any issues with his liquid diet.  Vital signs in last 24 hours: [min-max] current  Temp:  [97.7 F (36.5 C)-98.7 F (37.1 C)] 97.7 F (36.5 C) (06/26 0815) Pulse Rate:  [90-109] 91 (06/26 0815) Resp:  [16-18] 16 (06/26 0815) BP: (101-123)/(75-94) 111/75 (06/26 0815) SpO2:  [92 %-97 %] 97 % (06/26 0815)     Height: 5\' 9"  (175.3 cm) Weight: 88.5 kg BMI (Calculated): 28.78   Physical Exam:  Constitutional: alert, cooperative and no distress  Respiratory: breathing non-labored at rest  Cardiovascular: regular rate and sinus rhythm  Gastrointestinal: soft, non-tender, and distended   Imaging studies: No new pertinent imaging studies   Assessment/Plan:  55 y.o. male with recurrent paraesophageal hernia and incisional hernia 3 Days Post-Op s/p robotic assisted laparoscopic Nissen fundoplication with repair of recurrent paraesophageal hernia and incisional hernia repair, complicated by pertinent comorbidities including bipolar disorder, GERD.  Patient is still not with adequate pain control.  We will continue with pain medication.  Patient is tolerating full liquids.  We will keep him full liquid diet.  Physical therapy evaluated the patient and recommended home health physical therapy.  They will keep working with patient as inpatient.  I encouraged the patient to ambulate.  We will continue with DVT prophylaxis.  56, MD

## 2020-10-26 NOTE — TOC Progression Note (Signed)
Transition of Care [facility]) - Progression Note    Patient Details  Name: Albert [name] MRN: [id number] Date of Birth: 02/24/1966  Transition of Care [facility]) CM/SW Contact  Albert Royalty Lutricia Feil, RN Phone Number:[phone] 10/26/2020, 3:31 PM  Clinical Narrative:    Recommended HHPT upon discharge. Spoke with pt and caregiver mother Albert [name]) and pt agreed to HHPT with [facility]. Spoke with Albert ([facility]) who accepted pt and will be able to services pt on 6/28. Pt lives alone in an apartment however will recover at his parents home initially then return home with his independent care. Pt verifies he will be able to obtain his medications and has sufficient transportation services to all his medical appointments and upon discharging from the hospital. No DME recommended at this time.   TOC will continue to follow up accordingly with pt's ongoing needs.      Barriers to Discharge: Continued Medical Work up  Expected Discharge Plan and Services                                     HH Arranged: PT [facility] Agency: [facility] Date [facility] Agency Contacted: 10/26/20 Time HH Agency Contacted: [id number] Representative spoke with at [facility] Agency: Albert [name]   Social Determinants of Health (SDOH) Interventions    Readmission Risk Interventions No flowsheet data found.

## 2020-10-27 ENCOUNTER — Telehealth: Payer: Self-pay

## 2020-10-27 ENCOUNTER — Telehealth: Payer: Self-pay | Admitting: Surgery

## 2020-10-27 LAB — SURGICAL PATHOLOGY

## 2020-10-27 NOTE — Telephone Encounter (Signed)
Please call mom, Albert [name].  She is caring for her son while at home.  She has quite a few questions.  He is post op paraesophageal hernia repair with Dr. Everlene Farrier on 10/23/20.  She wants to know how long he needs to be on a liquid diet?  Also since he is bi-polar and on a lot of antidepressants she is concerned that he really doesn't complain of pain and instead of giving him the Oxycodone if ok to use Tylenol?  Also he was put on Lyrica and she is very concerned about giving him this because of the other meds he is currently taking.  Please call her.  Thank you.

## 2020-10-27 NOTE — Progress Notes (Signed)
Discharge Note: Charge Nurse Reviewed discharge instructions. Iv intact upon removal.  Pt d/ced with personal belongings. Staff wheeled pt out. Pt transported to  home via family vehicle.

## 2020-10-27 NOTE — Care Management Important Message (Signed)
Important Message  Patient Details  Name: Albert [name] MRN: [id number] Date of Birth: 1965/12/15   Medicare Important Message Given:  Yes     [name] 10/27/2020, 11:33 AM

## 2020-10-27 NOTE — Discharge Summary (Signed)
[facility] SURGICAL DISCHARGE SUMMARY  Patient ID: Albert [name] MRN: [id number] DOB/AGE: 01-30-1966 55 y.o.  Admit date: 10/23/2020 Discharge date: 10/27/2020  Discharge Diagnoses Patient Active Problem List   Diagnosis Date Noted   S/P repair of paraesophageal hernia 10/23/2020    Consultants None  Procedures 10/23/2020:  Robotic assisted laparoscopic Nissen fundoplication w repair of  Recurrent paraesophageal hernia with Mesh Bio-A 10x7cm Incisional hernia repair using 6.4 cm ventralex patch    HPI: Albert [name] is a 55 y.o. male with a history of recurrent type III paraesophageal hernia with significant reflux symptoms as well as a supraumbilical incisional hernia who presents to [facility] on 06/23 for scheduled procedure.   Hospital Course: Informed consent was obtained and documented, and patient underwent uneventful robotic assisted laparoscopic paraesophageal hernia repair, Nissen fundoplication, and incisional hernia repair (Dr Everlene Farrier, 10/23/2020).  Post-operatively, patient did have issues with trouble swallowing on POD1. UGI and CXR were reassuring, and symptoms likely secondary to post-operative edema. He also had issues with adequate pain control. Ultimately, he did well. Ambulation with therapy was well tolerated. The remainder of patient's hospital course was essentially unremarkable, and discharge planning was initiated accordingly with patient safely able to be discharged home with appropriate discharge instructions, pain control, and outpatient follow-up after all of his questions were answered to his expressed satisfaction.   Discharge Condition: Good   Physical Examination:  Constitutional: Well appearing male, NAD Pulmonary: Normal effort, no respiratory distress Gastrointestinal: Soft, incisional soreness, non-distended, no rebound/guarding  Skin: Laparoscopic incisions are CDI with dermabond, no erythema or drainage    Allergies as of 10/27/2020        Reactions   Meloxicam Other (See Comments)   Other reaction(s): Dizziness   Prednisone    Can't sleep    Wellbutrin [bupropion]    Suicide thoughts        Medication List     TAKE these medications    acetaminophen 500 MG tablet Commonly known as: TYLENOL Take 1,000 mg by mouth every 6 (six) hours as needed for moderate pain.   esomeprazole 40 MG capsule Commonly known as: NEXIUM Take 40 mg by mouth every morning.   fenofibrate 145 MG tablet Commonly known as: TRICOR Take 145 mg by mouth daily.   FLUoxetine 40 MG capsule Commonly known as: PROZAC Take 1 capsule (40 mg total) by mouth daily.   fluticasone 50 MCG/ACT nasal spray Commonly known as: FLONASE Place 1 spray into both nostrils daily as needed for allergies or rhinitis.   hydrocortisone 2.5 % rectal cream Commonly known as: ANUSOL-HC Place 1 application rectally 3 (three) times daily as needed for hemorrhoids.   levothyroxine 75 MCG tablet Commonly known as: SYNTHROID Take 75 mcg by mouth daily before breakfast.   lisinopril 5 MG tablet Commonly known as: ZESTRIL Take 5 mg by mouth daily.   lithium carbonate 300 MG capsule Take 2 capsules (600 mg total) by mouth daily.   meclizine 25 MG tablet Commonly known as: ANTIVERT Take 25 mg by mouth 3 (three) times daily as needed for dizziness.   OLANZapine 15 MG tablet Commonly known as: ZYPREXA Take 1 tablet (15 mg total) by mouth at bedtime.   OLANZapine 5 MG tablet Commonly known as: ZyPREXA 1/2 tablet at night for 1 week, then 1 tablet at night   oxyCODONE 5 MG immediate release tablet Commonly known as: Oxy IR/ROXICODONE Take 1 tablet (5 mg total) by mouth every 4 (four) hours as needed for severe  pain or breakthrough pain.   pregabalin 50 MG capsule Commonly known as: LYRICA Take 1 capsule (50 mg total) by mouth 3 (three) times daily for 14 days.   sildenafil 20 MG tablet Commonly known as: REVATIO Take 100 mg by mouth daily as needed  (ED).       ASK your doctor about these medications    LORazepam 1 MG tablet Commonly known as: ATIVAN TAKE 1 TABLET BY MOUTH EVERY 8 HOURS   QUEtiapine 300 MG tablet Commonly known as: SEROQUEL Take 3 tablets (900 mg) by mouth at bedtime for 3 nights, then decrease to 2 tablets (600 mg) at bedtime.   tamsulosin 0.4 MG Caps capsule Commonly known as: FLOMAX TAKE 1 CAPSULE BY MOUTH ONCE DAILY          Follow-up Information     Pabon, [place] F, MD. Schedule an appointment as soon as possible for a visit in 2 week(s).   Specialty: General Surgery Why: s/p Nissen fundoplication & incisional hernia repair Contact information: [address] [place] [address] [phone]                  Time spent on discharge management including discussion of hospital course, clinical condition, outpatient instructions, prescriptions, and follow up with the patient and members of the medical team: >30 minutes  -- Lynden Oxford , PA-C [facility]  10/27/2020, 7:56 AM [phone] M-F: 7am - 4pm

## 2020-10-27 NOTE — Telephone Encounter (Signed)
Paraesophageal hernia repair 10/23/2020- full liquids until post operative appointment  per Dr.Pabon-You may stop the Lyrica-You may try tylenol for the pain- if Tylenol does not help then go back to taking the Oxycodone-ask if he could have a smoothie and was told yes.

## 2020-10-29 ENCOUNTER — Other Ambulatory Visit: Payer: Self-pay | Admitting: Psychiatry

## 2020-10-29 ENCOUNTER — Telehealth: Payer: Self-pay | Admitting: Surgery

## 2020-10-29 DIAGNOSIS — F314 Bipolar disorder, current episode depressed, severe, without psychotic features: Secondary | ICD-10-CM

## 2020-10-29 DIAGNOSIS — F411 Generalized anxiety disorder: Secondary | ICD-10-CM

## 2020-10-29 DIAGNOSIS — F4001 Agoraphobia with panic disorder: Secondary | ICD-10-CM

## 2020-10-29 NOTE — Telephone Encounter (Signed)
Dosage of lorazepam is 1 mg 3 times daily as needed, Olanzapine has not been increased to 20 mg nightly and Seroquel dose has been changed

## 2020-10-29 NOTE — Telephone Encounter (Signed)
Spoke with patients mother- full liquid diet until post operative appointment- question regarding congestion- may need to see or call his PCP-may try Robitussin DM -call [facility] and send paperwork to our office-for signature for PT.

## 2020-10-29 NOTE — Telephone Encounter (Signed)
Please review,olanzapine sent last week and ativan last filled 3/18

## 2020-10-29 NOTE — Telephone Encounter (Signed)
Patients mother is calling and is asking if her son was suppose to be having Physical therapy done. Patient is having a lot of congestion going on with coughing off and on, also is on a complete liquid diet and would like some kind of instructions on exactly what foods he can have. Please call patient's mother and advise.

## 2020-11-05 ENCOUNTER — Ambulatory Visit (INDEPENDENT_AMBULATORY_CARE_PROVIDER_SITE_OTHER): Payer: Medicare PPO | Admitting: Psychiatry

## 2020-11-05 ENCOUNTER — Other Ambulatory Visit: Payer: Self-pay

## 2020-11-05 ENCOUNTER — Encounter: Payer: Self-pay | Admitting: Psychiatry

## 2020-11-05 VITALS — BP 130/83 | HR 87

## 2020-11-05 DIAGNOSIS — F4001 Agoraphobia with panic disorder: Secondary | ICD-10-CM

## 2020-11-05 DIAGNOSIS — Z79899 Other long term (current) drug therapy: Secondary | ICD-10-CM

## 2020-11-05 DIAGNOSIS — F5105 Insomnia due to other mental disorder: Secondary | ICD-10-CM | POA: Diagnosis not present

## 2020-11-05 DIAGNOSIS — F314 Bipolar disorder, current episode depressed, severe, without psychotic features: Secondary | ICD-10-CM

## 2020-11-05 DIAGNOSIS — F411 Generalized anxiety disorder: Secondary | ICD-10-CM

## 2020-11-05 DIAGNOSIS — R6889 Other general symptoms and signs: Secondary | ICD-10-CM

## 2020-11-05 MED ORDER — LORAZEPAM 1 MG PO TABS: 1.0000 mg | ORAL_TABLET | Freq: Three times a day (TID) | ORAL | 3 refills | Status: DC

## 2020-11-05 MED ORDER — QUETIAPINE FUMARATE 300 MG PO TABS: 600.0000 mg | ORAL_TABLET | Freq: Every day | ORAL | 1 refills | Status: DC

## 2020-11-05 MED ORDER — OLANZAPINE 20 MG PO TABS: 20.0000 mg | ORAL_TABLET | Freq: Every day | ORAL | 1 refills | Status: DC

## 2020-11-05 NOTE — Patient Instructions (Signed)
Reduce quetiapine to 1 of the 300 mg tablets at night to reduce risk of developing akathisia

## 2020-11-05 NOTE — Progress Notes (Signed)
ZEV BLUE [id number] [birth year] 55 y.o.    Subjective:   Patient ID:  Albert [name] is a 55 y.o. (DOB 02-09-1966) male.  Chief Complaint:  Chief Complaint  Patient presents with   Follow-up   Severe bipolar I disorder, current or most recent episode d   Depression   Anxiety    Depression        Associated symptoms include decreased concentration.  Associated symptoms include no headaches and no suicidal ideas.  Past medical history includes anxiety.   Anxiety Symptoms include decreased concentration and nervous/anxious behavior. Patient reports no confusion, dizziness, palpitations or suicidal ideas.     Albert [name] presents to the office today for follow-up of mixed bipolar, anxiety and still grieving stress of breakup.  He requires frequent follow-up because of long-term symptoms.  At visit October 25, 2018.  We made several medicine changes because of his concerns about sleep and other issues.  We increased olanzapine back to 7.5 mg bc not sleeping as well at 5 mg.  He has to have the prescription for quetiapine written for up to 3 tablets at night in case insomnia is worse because insomnia makes his mood disorder so much worse.  We discussed that was above the usual max but the request was granted given his treatment resistant status. We also reduce lithium from 900 mg daily to 750 mg daily to try to reduce tremor and muscle twitches.  At  visit March 28, 2019.  Fluoxetine was increased to 40 mg daily.   Early January 2021 increased to 60 mg daily.  No SE.  seen June 28, 2019.  No further meds were changed except olanzapine was increased back to 10 mg daily to see if if he could get additional benefit per his request.  Covid vaccinated.  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  Then had cholecystectomy.  April 2021 appt with the following noted: 2 episodes night sweats lately.  Memory has been very bad lately.  Repeatedly asks mother questions. Still  tend to stay in his room and  his bed.  Still has rapid cycling mood swings.  Maybe some better with increase in olanzapine to 10 and tolerating itl.  Would like to get out more but can't DT Covid.  Can perform necessary chores.  Will get out of the house when he can.  No change in death thoughts and anxiety in intensity but is better with frequency.  Usually comes and goes in waves but more persistent.  Consistent with meds.  Ativan not helping anxiety very dramatically but he's not sure.  Fidgety.  No trigger other than still grieving relationship and can't get it out of his head.  Poor energy, concentration and more forgetful. In bed more and less active.  Sleep ok lately which is unusual.  Can concentrate on financial matters and stock market at times.  Tolerating meds.  Still some intrusive SI without reason. Less frequent obsessive thoughts about broken relationship and has been doing this for months.  Can't let it go.  Loop.   No unusual stress even with the family who is supportive. Likes the benefit that Zyprexa gives. No sleepwalking nor falling nor odd behavior.  No history of sleep walking.  He understands this may recur with an increase.  Also wants option to rarely take extra quetiapine 300 for sleep prn. Plan:  Try to reduce lorazepam if possible.  10/22/2019 appointment with the following noted: Continues fluoxetine 60, lithium 600 mg,  lorazepam 2 mg AM and HS and 1 mg midday, olanzapine 10, quetiapine 600 mg HS. Still anxious chronically and including driving in crowded spaces.  Doesn't thing Ativan helps as well as Xanax but less cognitive problems. Sleep is not as good.  Occ EFA.  More EMA and wanting to do things in the middle of the night but this is not typical.  Wonders about why that happens.  Some napping.   Tolerating meds.  Asks about weight loss meds.  Disc this in detail.   Plan no changes except OK meclizine prn vertigo.  02/11/20 appt with the following noted: Able to gradually reduce lorazepam to 1  mg AM and HS. More depressed over time and less interested in things and less interest in going out but does with his parents. Has reduced from 800 to 600 mg HS with some awakening but usually able to go back to sleep.[name]  Spending a good amount of time in bed bc watches TV in bedroom.  Parents watch TV in different part of the house.  No mood swings he notices.   Concerns about weight gain about 200#. Likes olanzapine's benefit for sleep. Plan: Trial for TRD to  Increase fluoxetine to 80 mg daily  to use the combo with olanzapine for TR bipolar depression.   04/21/20 appt with following noted: I thought in beginning some benefit with fluoxetine.  Lifelong negative thinking continues.   Not much bipolar.  Reads a lot on bipolar.   Still tired and anxious a little more may be seasonal.  No familial stressors.  Tends to lay down in afternoon.  Anxiety not over anything in particular.   No SE with fluoxetine. Took meclizine prn.  Easily motion sick.    Asks questions about newer drugs for bipolar like Caplyta. Plan:  No med changes  06/30/20 appt noted: Tired of wearing masks.  Vaccinated.  Asked questions about when this will end with mask mandate.  Mood up and down some since here and getting up 2-3 times.  Disc awakening problems.  Trying to do better bc night eating some.   Mood is about average today so far.  Albert [name] out with friends for breakfast.  Recognizes activity helps mood.   3 days in a row napped a lot in the afternoon.   Bed is a comfort place.  Gets bored and hard to motivate.  Tries to stay away from night eating and spending.   More depressed when alone and wonders about med changes. Plan: Failed response to olanzapine + fluoxetine for TR bipolar depression.   Reduce fluoxetine to 1 daily and reduce olanzapine to one half nightly for 10 days and then stop it. Then start Caplyta 1 daily  08/27/2020 appointment with the following noted: Patient decompensated with the above switch to  Caplyta with intrusive suicidal thoughts and had to be hospitalized for psychiatric reasons.  Multiple phone calls with family members since that time.  Spoke with the psychiatric nurse practitioner with the decision to restart olanzapine in place of the Caplyta given the patient was more stable while on the combination of Seroquel and olanzapine than with the Caplyta. Caplyta triggered HI/SI and depressed and confused on it, and agitated and still has some of it now. No akathisia. Frustrated with lack of therapy at the hospital.   Hydroxyzine didn't help jitteriness.  Feels some better.  Fleeting SI & HI but not obsessive like it was prior to hospitalization.  Feels a little amped up and  nervous.  Scared of what could happen.  Apprehensive being here talking about things. 5-6 hours sleep last night and would like to have more. At mom and dad's house right now and up more in the day. Inadvertently stopped lorazepam abruptly by mistake likely contributing to shakiness. Still some memory issues.  Trying to stay out of bed when watching TV.  Some awakening but managing. Occ fleeting SI and distracts himself.   Always spends a lot of time just laying around.   Can have anxiety for no reason like coming here, and varies in intensity without pattern.      Less panic than in the past.  Some chronic depression and hyperactivity and loudness and hyperverbal.  Usually back to sleep.  Total 6 hours but some napping, awakens 2-4 times nightly but back to sleep..  Taking quetiapine 600 mg HS.  still lacks interest and motivation.  Can follow a TV show if interested. Plan: Restart lorazepam 1 mg in the morning, 1 mg in the afternoon and 1 mg at night for anxiety Stop hydroxyzine  Increase olanzapine to 1 and 1/2 of 10 mg tablets in evening  09/05/2020 appt noted: seen with parents today at his request Made med changes noted and tolerated the changes Better than I did last week. Now only fleeting HI/SI and "no where  near what it was".  Sleeping better.  Still not a lot of energy.  Anhedonia.  Less agitation.  A lot of anxiety all the time but it's helping.  Would like more energy and motivation.   Doesn't handle stress well. Parents note he's less shakey.  He's still staying with his parents.  Only driven once since this happened and F wants him to be able to drive and function more independently.  M agrees he's better.  Memory is better but not normal.  Primarily STM problems No akathisia with olanzapine this time.    Administering his own meds but mo watched. Slept 8 hours last night. Plan consider further reductions in quetiapine  10/17/20 appt noted: Stayed on Seroquel 600 HS and olanzapine 15. Olanzapine seemed to do most for the sleep.  Also helping eliminated HI/SI for the most part.  Wants to try to increase it.  Depression is much better with less rumination also.  Fears akathisia at higher dose as in the past. Also on fluoxetine 40.   Plan: Reduce Seroquel to 1 and 1/2 tablets at night Increase olanzapine to 1 of the 15 mg tablet and 1/2 of the 5 mg tablet for 1 week,  Then, if tolerated increase the olanzapine to 20 mg or 1 of the 15 mg tablets and 1 of the 5 mg tablets. If possible then also reduce quetiapine to 1 tablet at night.  11/05/2020 appointment with the following noted: Up to olanzapine 20 mg hs for 3 days.  Reduced Serooquel to 450 mg HS.  Didn't try to go lower. Had hiatal hernia and umbilical hernia surgery recently with no problem.   No akathisia so far. Sleep ok with meds so far changes.  No mood changes yet but overall likes the smoothness of olanzapine and helping sleep without knociing him out. Still some occ HI/SI, he thinks bc of anxiety generally.  Easily anxious.  Psych med hx extensive including ECT and  risperidone, lithium 1200 SE, Zyprexa 20-15 akathisia, Latuda 80 which caused akathisia, Abilify, Vraylar, Rexulti, aripiprazole 20 mg with akathisia, Seroquel 1000 mg,   InVega, Geodon, Saphris with side effects, symbyax, Fanapt  NR .  Caplyta SE and markedly worse. lamotrigine 300 mg, Depakote 2000 mg, Tegretol,Trileptal and several of these in combinations, gabapentin, N-acetylcysteine, Nuedexta,    Belsomra with no response,  Lunesta no response, trazodone 200 mg, Xanax, clonazepam, lorazepam less sedation. clonidine, Viibryd 40 mg for 3 months with diarrhea, protriptyline with side effects, Trintellix 20 mg, Parnate 50 mg with no response,  imipramine, venlafaxine,  Emsam 12 mg for 2 months,  bupropion was side effects,  Lexapro 20 mg, sertraline, paroxetine, Deplin, fluoxetine 80 methylphenidate 60 mg,  Vyvanse, Concerta, strattera, , modafinil,  pramipexole,  amantadine , Patient prone to akathisia.  Review of Systems:  Review of Systems  Cardiovascular:  Negative for palpitations.  Gastrointestinal:  Positive for abdominal pain.  Musculoskeletal:  Positive for back pain.  Neurological:  Positive for tremors. Negative for dizziness, weakness, light-headedness and headaches.       Fidgety  Psychiatric/Behavioral:  Positive for decreased concentration. Negative for agitation, behavioral problems, confusion, dysphoric mood, hallucinations, self-injury, sleep disturbance and suicidal ideas. The patient is nervous/anxious and is hyperactive.    Medications: I have reviewed the patient's current medications.  Current Outpatient Medications  Medication Sig Dispense Refill   acetaminophen (TYLENOL) 500 MG tablet Take 1,000 mg by mouth every 6 (six) hours as needed for moderate pain.     esomeprazole (NEXIUM) 40 MG capsule Take 40 mg by mouth every morning.     fenofibrate (TRICOR) 145 MG tablet Take 145 mg by mouth daily.     FLUoxetine (PROZAC) 40 MG capsule Take 1 capsule (40 mg total) by mouth daily. 90 capsule 0   fluticasone (FLONASE) 50 MCG/ACT nasal spray Place 1 spray into both nostrils daily as needed for allergies or rhinitis.     hydrocortisone  (ANUSOL-HC) 2.5 % rectal cream Place 1 application rectally 3 (three) times daily as needed for hemorrhoids.     levothyroxine (SYNTHROID, LEVOTHROID) 75 MCG tablet Take 75 mcg by mouth daily before breakfast.     lisinopril (ZESTRIL) 5 MG tablet Take 5 mg by mouth daily.     lithium carbonate 300 MG capsule Take 2 capsules (600 mg total) by mouth daily. 180 capsule 1   meclizine (ANTIVERT) 25 MG tablet Take 25 mg by mouth 3 (three) times daily as needed for dizziness.     oxyCODONE (OXY IR/ROXICODONE) 5 MG immediate release tablet Take 1 tablet (5 mg total) by mouth every 4 (four) hours as needed for severe pain or breakthrough pain. 30 tablet 0   pregabalin (LYRICA) 50 MG capsule Take 1 capsule (50 mg total) by mouth 3 (three) times daily for 14 days. 42 capsule 0   sildenafil (REVATIO) 20 MG tablet Take 100 mg by mouth daily as needed (ED).     tamsulosin (FLOMAX) 0.4 MG CAPS capsule TAKE 1 CAPSULE BY MOUTH ONCE DAILY (Patient taking differently: Take 0.4 mg by mouth daily.) 90 capsule 3   LORazepam (ATIVAN) 1 MG tablet Take 1 tablet (1 mg total) by mouth every 8 (eight) hours. 90 tablet 3   OLANZapine (ZYPREXA) 20 MG tablet Take 1 tablet (20 mg total) by mouth at bedtime. 30 tablet 1   QUEtiapine (SEROQUEL) 300 MG tablet Take 2 tablets (600 mg total) by mouth at bedtime. Take 3 tablets (900 mg) by mouth at bedtime for 3 nights, then decrease to 2 tablets (600 mg) at bedtime. 60 tablet 1   No current facility-administered medications for this visit.   Medication Side Effects: Other: mild sleepiness.  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    Hypothyroidism    Pneumonia    PONV (postoperative nausea and vomiting)     Family History  Problem Relation Age of Onset   Prostate cancer Neg Hx    Bladder Cancer Neg Hx    Kidney cancer Neg Hx     Social History   Socioeconomic History   Marital status: Single    Spouse name: Not on file   Number of children: Not on file   Years of education: Not on file   Highest education level: Not on file  Occupational History   Not on file  Tobacco Use   Smoking status: Former    Pack years: 0.00    Types: Cigarettes   Smokeless tobacco: Former    Types: Associate Professor Use: Never used  Substance and Sexual Activity   Alcohol use: Not Currently   Drug use: Never   Sexual activity: Yes  Other Topics Concern   Not on file  Social History Narrative   Not on file   Social Determinants of Health   Financial Resource Strain: Not on file  Food Insecurity: Not on file  Transportation Needs: Not on file  Physical Activity: Not on file  Stress: Not on file  Social Connections: Not on file  Intimate Partner Violence: Not on file    Past Medical History, Surgical history, Social history, and Family history were reviewed and updated as appropriate.   Please see review of systems for further details on the patient's review from today.   Objective:   Physical Exam:  BP 130/83   Pulse 87   Physical Exam Constitutional:      General: He is not in acute distress.    Appearance: He is well-developed.  Musculoskeletal:        General: No deformity.  Neurological:     Mental Status: He is alert and oriented to person, place, and time.     Cranial Nerves: No dysarthria.     Motor: Tremor  present.     Coordination: Coordination normal.     Comments: Slight tremor  Psychiatric:        Attention and Perception: Attention and perception normal. He does not perceive auditory or visual hallucinations.        Mood and Affect: Mood is anxious and depressed. Affect is not labile, blunt, angry, tearful or inappropriate.        Speech: Speech normal. Speech is not slurred.        Behavior: Behavior normal. Behavior is not slowed. Behavior is cooperative.        Thought Content: Thought content is not paranoid or delusional. Thought content does  not include homicidal or suicidal ideation. Thought content does not include homicidal or suicidal plan.        Cognition and Memory: Cognition normal. He exhibits impaired recent memory.        Judgment: Judgment normal.     Comments: Insight fair He is chronically anxious   But better than last visit No manic signs noted. Some wordfinding problems ongoing. No sui or homocidal intent or plan, but occ fleeting thoughts continue    Lab Review:     Component Value Date/Time   NA 138 10/24/2020 0502   NA 142 03/20/2014 0514   K 4.3 10/24/2020 0502   K 4.1 03/20/2014 0514   CL 109 10/24/2020 0502   CL 112 (H) 03/20/2014 0514   CO2 19 (L) 10/24/2020 0502   CO2 26 03/20/2014 0514   GLUCOSE 136 (H) 10/24/2020 0502   GLUCOSE 99 03/20/2014 0514   BUN 23 (H) 10/24/2020 0502   BUN 7 03/20/2014 0514   CREATININE 1.75 (H) 10/24/2020 0502   CREATININE 0.78 03/20/2014 0514   CALCIUM 8.7 (L) 10/24/2020 0502   CALCIUM 9.5 03/20/2014 0514   PROT 7.8 08/11/2020 1750   PROT 7.3 03/18/2014 1459   ALBUMIN 4.7 08/11/2020 1750   ALBUMIN 3.2 (L) 03/18/2014 1459   AST 23 08/11/2020 1750   AST 16 03/18/2014 1459   ALT 16 08/11/2020 1750   ALT 16 03/18/2014 1459   ALKPHOS 34 (L) 08/11/2020 1750   ALKPHOS 105 03/18/2014 1459   BILITOT 0.5 08/11/2020 1750   BILITOT 0.3 03/18/2014 1459   GFRNONAA 45 (L) 10/24/2020 0502   GFRNONAA >60 03/20/2014 0514    GFRAA 54 (L) 04/18/2019 1612   GFRAA >60 03/20/2014 0514       Component Value Date/Time   WBC 12.3 (H) 10/24/2020 0502   RBC 3.76 (L) 10/24/2020 0502   HGB 11.9 (L) 10/24/2020 0502   HGB 12.6 (L) 03/20/2014 0514   HCT 37.1 (L) 10/24/2020 0502   HCT 38.4 (L) 03/20/2014 0514   PLT 328 10/24/2020 0502   PLT 346 03/20/2014 0514   MCV 98.7 10/24/2020 0502   MCV 89 03/20/2014 0514   MCH 31.6 10/24/2020 0502   MCHC 32.1 10/24/2020 0502   RDW 13.1 10/24/2020 0502   RDW 12.5 03/20/2014 0514   LYMPHSABS 3.2 03/20/2014 0514   MONOABS 1.0 03/20/2014 0514   EOSABS 0.4 03/20/2014 0514   BASOSABS 0.1 03/20/2014 0514    Lithium Lvl  Date Value Ref Range Status  08/11/2020 0.95 0.60 - 1.20 mmol/L Final    Comment:    Performed at [facility], [address] [place], [place] [address]    Last lithium level Sept 0.8.   Last lithium level July 27, 2018 was normal at 1.0.   Lithium level [facility] October 03, 2018 = 1.2. Said he got lithium level at [facility] as requested.  Labs not in Epic.  Recent lipids ok except higher TG than usual.  Normal A1C.  No results found for: PHENYTOIN, PHENOBARB, VALPROATE, CBMZ   .res Assessment: Plan:    Albert [name] was seen today for follow-up, severe bipolar i disorder, current or most recent episode d, depression and anxiety.  Diagnoses and all orders for this visit:  Severe bipolar I disorder, current or most recent episode depressed (HCC) -     OLANZapine (ZYPREXA) 20 MG tablet; Take 1 tablet (20 mg total) by mouth at bedtime. -     QUEtiapine (SEROQUEL) 300 MG tablet; Take 2  tablets (600 mg total) by mouth at bedtime. Take 3 tablets (900 mg) by mouth at bedtime for 3 nights, then decrease to 2 tablets (600 mg) at bedtime.  Generalized anxiety disorder -     OLANZapine (ZYPREXA) 20 MG tablet; Take 1 tablet (20 mg total) by mouth at bedtime. -     Discontinue: LORazepam (ATIVAN) 1 MG tablet; Take 1 tablet (1 mg total) by mouth every 8 (eight)  hours. -     LORazepam (ATIVAN) 1 MG tablet; Take 1 tablet (1 mg total) by mouth every 8 (eight) hours.  Panic disorder with agoraphobia -     OLANZapine (ZYPREXA) 20 MG tablet; Take 1 tablet (20 mg total) by mouth at bedtime. -     Discontinue: LORazepam (ATIVAN) 1 MG tablet; Take 1 tablet (1 mg total) by mouth every 8 (eight) hours. -     LORazepam (ATIVAN) 1 MG tablet; Take 1 tablet (1 mg total) by mouth every 8 (eight) hours.  Insomnia due to mental condition  Forgetfulness  Lithium use   Chronic TR bipolar mixed and chronic anxiety.  He usually has mixed bipolar symptoms which we have not been able to completely eliminate.  See long list of meds tried.   He is somewhat chronically unstable..  Discussed mother's concerns about his memory which is primarily a short-term memory issue.  We discussed the alternative options of using Namenda off label for mild cognitive impairment.  We are not yet able to reduce the dose of lorazepam but that could potentially help as well.  His memory was much worse on Xanax than lorazepam. We discussed the short-term risks associated with benzodiazepines including sedation and increased fall risk among others.  Discussed long-term side effect risk including dependence, potential withdrawal symptoms, and the potential eventual dose-related risk of dementia. Disc newer studies refute dementia risks.  Unfortunately due to the severity of set his symptoms polypharmacy is a necessity.  Disc unusual combo antipsychotics but it helps more than other options.  Discussed potential metabolic side effects associated with atypical antipsychotics, as well as potential risk for movement side effects. Advised pt to contact office if movement side effects occur.  He had some falling around the time when he previously took olanzapine but he took it for several months at this dosage before he had any side effect issues and he was on Xanax at the time that the following  occurred. Take LED Seroquel to sleep.   No obvious alternatives for sleep  Lithium being used bc chronic SI and death thoughts.   Counseled patient regarding potential benefits, risks, and side effects of lithium to include potential risk of lithium affecting thyroid and renal function.  Discussed need for periodic lab monitoring to determine drug level and to assess for potential adverse effects.  Counseled patient regarding signs and symptoms of lithium toxicity and advised that they notify office immediately or seek urgent medical attention if experiencing these signs and symptoms.  Patient advised to contact office with any questions or concerns. Continue lithium 2 of the 300 mg capsules. Lithium level 0.8.  On 750 mg daily.   Call if death thoughts worsen or worsening SI. Prefer only 1 med change at a time.  Continue lorazepam 1 mg in the morning, 1 mg in the afternoon and 1 mg at night for anxiety  He is much improved back on the olanzapine versus the Caplyta.  Previously he had difficulty tolerating 15 mg of olanzapine due to akathisia but he  is not having akathisia at this time on 20 mg daily.  Disc SE. Disc combo with Seroquel may lower response rate but he doesn't think he can sleep without Seroquel. Will try to go as low as possible if not off of it eventually.    Continue olanzapine 20 mg nightly longer to give it time to help mood sx reduce quetiapine to 1 tablet at night to minimize polypharmacy and reduce risk of akathisia.  We discussed the short-term risks associated with benzodiazepines including sedation and increased fall risk among others.  Discussed long-term side effect risk including dependence, potential withdrawal symptoms, and the potential eventual dose-related risk of dementia.  But recent studies from 2020 dispute this association between benzodiazepines and dementia risk. Newer studies in 2020 do not support an association with dementia.  Discussed safety plan at  length with patient.  Advised patient to contact office with any worsening signs and symptoms.  Instructed patient to go to the [facility] emergency room for evaluation if experiencing any acute safety concerns, to include suicidal intent.  Disc Ozempic for weight loss.  Has had weight gain from meds as a contributor.  This appt was 30 mins.  FU 4 weeks   Meredith Staggers, MD, DFAPA  Future Appointments  Date Time Provider Department Center  11/12/2020  3:45 PM Leafy Ro, MD [facility] None  12/25/2020  1:30 PM Sondra Come, MD [facility] None  01/07/2021 10:30 AM Cottle, Steva Ready., MD [facility] None    No orders of the defined types were placed in this encounter.     -------------------------------

## 2020-11-12 ENCOUNTER — Ambulatory Visit (INDEPENDENT_AMBULATORY_CARE_PROVIDER_SITE_OTHER): Payer: Medicare PPO | Admitting: Surgery

## 2020-11-12 ENCOUNTER — Encounter: Payer: Self-pay | Admitting: Surgery

## 2020-11-12 ENCOUNTER — Other Ambulatory Visit: Payer: Self-pay | Admitting: Psychiatry

## 2020-11-12 ENCOUNTER — Other Ambulatory Visit: Payer: Self-pay

## 2020-11-12 VITALS — BP 102/72 | HR 83 | Temp 99.1°F | Resp 16 | Ht 69.0 in | Wt 192.0 lb

## 2020-11-12 DIAGNOSIS — Z09 Encounter for follow-up examination after completed treatment for conditions other than malignant neoplasm: Secondary | ICD-10-CM

## 2020-11-12 NOTE — Patient Instructions (Addendum)
If you have any concerns or questions, please feel free to call our office.   GENERAL POST-OPERATIVE PATIENT INSTRUCTIONS   WOUND CARE INSTRUCTIONS:  Keep a dry clean dressing on the wound if there is drainage. The initial bandage may be removed after 24 hours.  Once the wound has quit draining you may leave it open to air.  If clothing rubs against the wound or causes irritation and the wound is not draining you may cover it with a dry dressing during the daytime.  Try to keep the wound dry and avoid ointments on the wound unless directed to do so.  If the wound becomes bright red and painful or starts to drain infected material that is not clear, please contact your physician immediately.  If the wound is mildly pink and has a thick firm ridge underneath it, this is normal, and is referred to as a healing ridge.  This will resolve over the next 4-6 weeks.  BATHING: You may shower if you have been informed of this by your surgeon. However, Please do not submerge in a tub, hot tub, or pool until incisions are completely sealed or have been told by your surgeon that you may do so.  DIET:  You may eat any foods that you can tolerate.  It is a good idea to eat a high fiber diet and take in plenty of fluids to prevent constipation.  If you do become constipated you may want to take a mild laxative or take ducolax tablets on a daily basis until your bowel habits are regular.  Constipation can be very uncomfortable, along with straining, after recent surgery.  ACTIVITY:  You are encouraged to cough and deep breath or use your incentive spirometer if you were given one, every 15-30 minutes when awake.  This will help prevent respiratory complications and low grade fevers post-operatively if you had a general anesthetic.  You may want to hug a pillow when coughing and sneezing to add additional support to the surgical area, if you had abdominal or chest surgery, which will decrease pain during these times.  You  are encouraged to walk and engage in light activity for the next two weeks.  You should not lift more than 15 pounds, until 12/04/2020 as it could put you at increased risk for complications.  Twenty pounds is roughly equivalent to a plastic bag of groceries. At that time- Listen to your body when lifting, if you have pain when lifting, stop and then try again in a few days. Soreness after doing exercises or activities of daily living is normal as you get back in to your normal routine.  MEDICATIONS:  Try to take narcotic medications and anti-inflammatory medications, such as tylenol, ibuprofen, naprosyn, etc., with food.  This will minimize stomach upset from the medication.  Should you develop nausea and vomiting from the pain medication, or develop a rash, please discontinue the medication and contact your physician.  You should not drive, make important decisions, or operate machinery when taking narcotic pain medication.  SUNBLOCK Use sun block to incision area over the next year if this area will be exposed to sun. This helps decrease scarring and will allow you avoid a permanent darkened area over your incision.

## 2020-11-14 NOTE — Progress Notes (Signed)
Albert [name] is status post robotic hiatal hernia repair and incisional hernia repair w mesh three weeks ago.  This was uneventful.  he is doing very well and she is tolerating soft diet.  No fevers no chills no dysphagia.  Reflux much improved Comes with mother who is very appreciative   PE: NAD Chest: CTA Abd: Soft nontender incisions healing well without evidence of infection or peritonitis.  No evidence of ventral hernia recurrence.   A/P doing very well after robotic hiatal hernia repair.  No complications.  We will continue routine Nissen diet.  We will see him back in a couple months.

## 2020-12-17 ENCOUNTER — Other Ambulatory Visit: Payer: Self-pay | Admitting: Psychiatry

## 2020-12-17 DIAGNOSIS — F314 Bipolar disorder, current episode depressed, severe, without psychotic features: Secondary | ICD-10-CM

## 2020-12-17 DIAGNOSIS — F411 Generalized anxiety disorder: Secondary | ICD-10-CM

## 2020-12-17 DIAGNOSIS — F4001 Agoraphobia with panic disorder: Secondary | ICD-10-CM

## 2020-12-23 NOTE — Progress Notes (Signed)
This encounter was created in error - please disregard. Cancelled appt

## 2020-12-25 ENCOUNTER — Ambulatory Visit: Payer: Medicare PPO | Admitting: Urology

## 2020-12-25 ENCOUNTER — Encounter: Payer: Self-pay | Admitting: Urology

## 2020-12-25 ENCOUNTER — Other Ambulatory Visit: Payer: Self-pay

## 2020-12-25 VITALS — BP 124/78 | HR 80 | Ht 69.0 in | Wt 192.0 lb

## 2020-12-25 DIAGNOSIS — N529 Male erectile dysfunction, unspecified: Secondary | ICD-10-CM | POA: Diagnosis not present

## 2020-12-25 DIAGNOSIS — N401 Enlarged prostate with lower urinary tract symptoms: Secondary | ICD-10-CM | POA: Diagnosis not present

## 2020-12-25 DIAGNOSIS — R3914 Feeling of incomplete bladder emptying: Secondary | ICD-10-CM | POA: Diagnosis not present

## 2020-12-25 DIAGNOSIS — Z125 Encounter for screening for malignant neoplasm of prostate: Secondary | ICD-10-CM | POA: Diagnosis not present

## 2020-12-25 DIAGNOSIS — R3121 Asymptomatic microscopic hematuria: Secondary | ICD-10-CM | POA: Diagnosis not present

## 2020-12-25 DIAGNOSIS — N138 Other obstructive and reflux uropathy: Secondary | ICD-10-CM

## 2020-12-25 LAB — BLADDER SCAN AMB NON-IMAGING

## 2020-12-25 MED ORDER — SILDENAFIL CITRATE 20 MG PO TABS: 100.0000 mg | ORAL_TABLET | Freq: Every day | ORAL | 11 refills | Status: DC | PRN

## 2020-12-25 MED ORDER — TAMSULOSIN HCL 0.4 MG PO CAPS: 0.4000 mg | ORAL_CAPSULE | Freq: Every day | ORAL | 3 refills | Status: DC

## 2020-12-25 NOTE — Patient Instructions (Signed)
Benign Prostatic Hyperplasia  Benign prostatic hyperplasia (BPH) is an enlarged prostate gland that is caused by the normal aging process and not by cancer. The prostate is a walnut-sized gland that is involved in the production of semen. It is located in front of the rectum and below the bladder. The bladder stores urine and the urethra is the tube that carries the urine out of the body. The prostate may get bigger asa man gets older. An enlarged prostate can press on the urethra. This can make it harder to pass urine. The build-up of urine in the bladder can cause infection. Back pressure and infection may progress to bladder damage and kidney (renal) failure. What are the causes? This condition is part of a normal aging process. However, not all men develop problems from this condition. If the prostate enlarges away from the urethra, urine flow will not be blocked. If it enlarges toward the urethra andcompresses it, there will be problems passing urine. What increases the risk? This condition is more likely to develop in men over the age of 50 years. What are the signs or symptoms? Symptoms of this condition include: Getting up often during the night to urinate. Needing to urinate frequently during the day. Difficulty starting urine flow. Decrease in size and strength of your urine stream. Leaking (dribbling) after urinating. Inability to pass urine. This needs immediate treatment. Inability to completely empty your bladder. Pain when you pass urine. This is more common if there is also an infection. Urinary tract infection (UTI). How is this diagnosed? This condition is diagnosed based on your medical history, a physical exam, and your symptoms. Tests will also be done, such as: A post-void bladder scan. This measures any amount of urine that may remain in your bladder after you finish urinating. A digital rectal exam. In a rectal exam, your health care provider checks your prostate by  putting a lubricated, gloved finger into your rectum to feel the back of your prostate gland. This exam detects the size of your gland and any abnormal lumps or growths. An exam of your urine (urinalysis). A prostate specific antigen (PSA) screening. This is a blood test used to screen for prostate cancer. An ultrasound. This test uses sound waves to electronically produce a picture of your prostate gland. Your health care provider may refer you to a specialist in kidney and prostate diseases (urologist). How is this treated? Once symptoms begin, your health care provider will monitor your condition (active surveillance or watchful waiting). Treatment for this condition will depend on the severity of your condition. Treatment may include: Observation and yearly exams. This may be the only treatment needed if your condition and symptoms are mild. Medicines to relieve your symptoms, including: Medicines to shrink the prostate. Medicines to relax the muscle of the prostate. Surgery in severe cases. Surgery may include: Prostatectomy. In this procedure, the prostate tissue is removed completely through an open incision or with a laparoscope or robotics. Transurethral resection of the prostate (TURP). In this procedure, a tool is inserted through the opening at the tip of the penis (urethra). It is used to cut away tissue of the inner core of the prostate. The pieces are removed through the same opening of the penis. This removes the blockage. Transurethral incision (TUIP). In this procedure, small cuts are made in the prostate. This lessens the prostate's pressure on the urethra. Transurethral microwave thermotherapy (TUMT). This procedure uses microwaves to create heat. The heat destroys and removes a small   amount of prostate tissue. Transurethral needle ablation (TUNA). This procedure uses radio frequencies to destroy and remove a small amount of prostate tissue. Interstitial laser coagulation (ILC).  This procedure uses a laser to destroy and remove a small amount of prostate tissue. Transurethral electrovaporization (TUVP). This procedure uses electrodes to destroy and remove a small amount of prostate tissue. Prostatic urethral lift. This procedure inserts an implant to push the lobes of the prostate away from the urethra. Follow these instructions at home: Take over-the-counter and prescription medicines only as told by your health care provider. Monitor your symptoms for any changes. Contact your health care provider with any changes. Avoid drinking large amounts of liquid before going to bed or out in public. Avoid or reduce how much caffeine or alcohol you drink. Give yourself time when you urinate. Keep all follow-up visits as told by your health care provider. This is important. Contact a health care provider if: You have unexplained back pain. Your symptoms do not get better with treatment. You develop side effects from the medicine you are taking. Your urine becomes very dark or has a bad smell. Your lower abdomen becomes distended and you have trouble passing your urine. Get help right away if: You have a fever or chills. You suddenly cannot urinate. You feel lightheaded, or very dizzy, or you faint. There are large amounts of blood or clots in the urine. Your urinary problems become hard to manage. You develop moderate to severe low back or flank pain. The flank is the side of your body between the ribs and the hip. These symptoms may represent a serious problem that is an emergency. Do not wait to see if the symptoms will go away. Get medical help right away. Call your local emergency services (911 in the [place] Do not drive yourself to the hospital. Summary Benign prostatic hyperplasia (BPH) is an enlarged prostate that is caused by the normal aging process and not by cancer. An enlarged prostate can press on the urethra. This can make it hard to pass urine. This  condition is part of a normal aging process and is more likely to develop in men over the age of 50 years. Get help right away if you suddenly cannot urinate. This information is not intended to replace advice given to you by your health care provider. Make sure you discuss any questions you have with your healthcare provider. Document Revised: 12/27/2019 Document Reviewed: 12/27/2019 Elsevier Patient Education  2022 [employer]  

## 2020-12-25 NOTE — Progress Notes (Signed)
   12/25/2020 1:33 PM   [name] 04/06/1966 [id number]  Reason for visit: Follow up BPH, ED, history of microscopic hematuria, PSA screening  HPI: 55 year old male with bipolar disorder who presents for follow-up of the above issues.  He has a long history of persistent microscopic hematuria negative work-up in 2012 with Dr. Achilles Dunk.  He has refused repeat work-ups numerous times.  He is currently taking Flomax 0.4 mg nightly which he feels greatly improves his urinary symptoms.  PVR is normal today at 2 mL.  He has some mild postvoid dribbling that is minimally bothersome.  He has ED that is well controlled with 100 mg Revatio on demand.  PSA May 2022 stable and normal at 0.5.  He recently had a hernia repair with Dr. Everlene Farrier, and CT prior to that procedure prostate measured 30 g.  I personally viewed and interpreted the imaging.  We briefly discussed outlet procedures like UroLift, he is minimally bothered by his urinary symptoms at this time and would like to continue Flomax.  Flomax and Revatio refilled RTC 1 year PVR, symptom check  Sondra Come, MD  [facility] [address], [address] [place], [place] [address] [phone]

## 2020-12-29 ENCOUNTER — Other Ambulatory Visit: Payer: Self-pay | Admitting: Psychiatry

## 2020-12-29 DIAGNOSIS — F314 Bipolar disorder, current episode depressed, severe, without psychotic features: Secondary | ICD-10-CM

## 2021-01-07 ENCOUNTER — Ambulatory Visit: Payer: Medicare PPO | Admitting: Psychiatry

## 2021-01-07 ENCOUNTER — Other Ambulatory Visit: Payer: Self-pay

## 2021-01-07 ENCOUNTER — Encounter: Payer: Self-pay | Admitting: Psychiatry

## 2021-01-07 DIAGNOSIS — F411 Generalized anxiety disorder: Secondary | ICD-10-CM | POA: Diagnosis not present

## 2021-01-07 DIAGNOSIS — F4001 Agoraphobia with panic disorder: Secondary | ICD-10-CM

## 2021-01-07 DIAGNOSIS — F314 Bipolar disorder, current episode depressed, severe, without psychotic features: Secondary | ICD-10-CM | POA: Diagnosis not present

## 2021-01-07 DIAGNOSIS — F5105 Insomnia due to other mental disorder: Secondary | ICD-10-CM

## 2021-01-07 DIAGNOSIS — Z79899 Other long term (current) drug therapy: Secondary | ICD-10-CM

## 2021-01-07 MED ORDER — LORAZEPAM 1 MG PO TABS: 1.0000 mg | ORAL_TABLET | Freq: Three times a day (TID) | ORAL | 3 refills | Status: DC

## 2021-01-07 MED ORDER — QUETIAPINE FUMARATE 50 MG PO TABS: 50.0000 mg | ORAL_TABLET | Freq: Every day | ORAL | 1 refills | Status: DC

## 2021-01-07 MED ORDER — FLUOXETINE HCL 40 MG PO CAPS: 40.0000 mg | ORAL_CAPSULE | Freq: Every day | ORAL | 0 refills | Status: DC

## 2021-01-07 MED ORDER — OLANZAPINE 20 MG PO TABS: 20.0000 mg | ORAL_TABLET | Freq: Every day | ORAL | 1 refills | Status: DC

## 2021-01-07 NOTE — Patient Instructions (Signed)
Reduce quetiapine to one half of the 300 mg tablet for 4 weeks and if tolerated then reduce quetiapine to 50 mg at night for 4 weeks and then try to stop it

## 2021-01-07 NOTE — Progress Notes (Signed)
Albert [name] [id number] 11/23/1965 55 y.o.    Subjective:   Patient ID:  Albert [name] is a 55 y.o. (DOB Feb 25, 1966) male.  Chief Complaint:  Chief Complaint  Patient presents with   Follow-up   Severe bipolar I disorder   Depression    Depression        Associated symptoms include decreased concentration.  Associated symptoms include no headaches and no suicidal ideas.  Past medical history includes anxiety.   Anxiety Symptoms include decreased concentration and nervous/anxious behavior. Patient reports no confusion, dizziness, palpitations or suicidal ideas.     Albert [name] presents to the office today for follow-up of mixed bipolar, anxiety and still grieving stress of breakup.  He requires frequent follow-up because of long-term symptoms.  At visit October 25, 2018.  We made several medicine changes because of his concerns about sleep and other issues.  We increased olanzapine back to 7.5 mg bc not sleeping as well at 5 mg.  He has to have the prescription for quetiapine written for up to 3 tablets at night in case insomnia is worse because insomnia makes his mood disorder so much worse.  We discussed that was above the usual max but the request was granted given his treatment resistant status. We also reduce lithium from 900 mg daily to 750 mg daily to try to reduce tremor and muscle twitches.  At  visit March 28, 2019.  Fluoxetine was increased to 40 mg daily.   Early January 2021 increased to 60 mg daily.  No SE.  seen June 28, 2019.  No further meds were changed except olanzapine was increased back to 10 mg daily to see if if he could get additional benefit per his request.  Covid vaccinated.  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  Then had cholecystectomy.  April 2021 appt with the following noted: 2 episodes night sweats lately.  Memory has been very bad lately.  Repeatedly asks mother questions. Still  tend to stay in his room and his bed.  Still has rapid cycling mood swings.   Maybe some better with increase in olanzapine to 10 and tolerating itl.  Would like to get out more but can't DT Covid.  Can perform necessary chores.  Will get out of the house when he can.  No change in death thoughts and anxiety in intensity but is better with frequency.  Usually comes and goes in waves but more persistent.  Consistent with meds.  Ativan not helping anxiety very dramatically but he's not sure.  Fidgety.  No trigger other than still grieving relationship and can't get it out of his head.  Poor energy, concentration and more forgetful. In bed more and less active.  Sleep ok lately which is unusual.  Can concentrate on financial matters and stock market at times.  Tolerating meds.  Still some intrusive SI without reason. Less frequent obsessive thoughts about broken relationship and has been doing this for months.  Can't let it go.  Loop.   No unusual stress even with the family who is supportive. Likes the benefit that Zyprexa gives. No sleepwalking nor falling nor odd behavior.  No history of sleep walking.  He understands this may recur with an increase.  Also wants option to rarely take extra quetiapine 300 for sleep prn. Plan:  Try to reduce lorazepam if possible.  10/22/2019 appointment with the following noted: Continues fluoxetine 60, lithium 600 mg, lorazepam 2 mg AM and HS and 1 mg  midday, olanzapine 10, quetiapine 600 mg HS. Still anxious chronically and including driving in crowded spaces.  Doesn't thing Ativan helps as well as Xanax but less cognitive problems. Sleep is not as good.  Occ EFA.  More EMA and wanting to do things in the middle of the night but this is not typical.  Wonders about why that happens.  Some napping.   Tolerating meds.  Asks about weight loss meds.  Disc this in detail.   Plan no changes except OK meclizine prn vertigo.  02/11/20 appt with the following noted: Able to gradually reduce lorazepam to 1 mg AM and HS. More depressed over time and less  interested in things and less interest in going out but does with his parents. Has reduced from 800 to 600 mg HS with some awakening but usually able to go back to sleep.Albert [name]  Spending a good amount of time in bed bc watches TV in bedroom.  Parents watch TV in different part of the house.  No mood swings he notices.   Concerns about weight gain about 200#. Likes olanzapine's benefit for sleep. Plan: Trial for TRD to  Increase fluoxetine to 80 mg daily  to use the combo with olanzapine for TR bipolar depression.   04/21/20 appt with following noted: I thought in beginning some benefit with fluoxetine.  Lifelong negative thinking continues.   Not much bipolar.  Reads a lot on bipolar.   Still tired and anxious a little more may be seasonal.  No familial stressors.  Tends to lay down in afternoon.  Anxiety not over anything in particular.   No SE with fluoxetine. Took meclizine prn.  Easily motion sick.    Asks questions about newer drugs for bipolar like Caplyta. Plan:  No med changes  06/30/20 appt noted: Tired of wearing masks.  Vaccinated.  Asked questions about when this will end with mask mandate.  Mood up and down some since here and getting up 2-3 times.  Disc awakening problems.  Trying to do better bc night eating some.   Mood is about average today so far.  [name] out with friends for breakfast.  Recognizes activity helps mood.   3 days in a row napped a lot in the afternoon.   Bed is a comfort place.  Gets bored and hard to motivate.  Tries to stay away from night eating and spending.   More depressed when alone and wonders about med changes. Plan: Failed response to olanzapine + fluoxetine for TR bipolar depression.   Reduce fluoxetine to 1 daily and reduce olanzapine to one half nightly for 10 days and then stop it. Then start Caplyta 1 daily  08/27/2020 appointment with the following noted: Patient decompensated with the above switch to Caplyta with intrusive suicidal thoughts and had  to be hospitalized for psychiatric reasons.  Multiple phone calls with family members since that time.  Spoke with the psychiatric nurse practitioner with the decision to restart olanzapine in place of the Caplyta given the patient was more stable while on the combination of Seroquel and olanzapine than with the Caplyta. Caplyta triggered HI/SI and depressed and confused on it, and agitated and still has some of it now. No akathisia. Frustrated with lack of therapy at the hospital.   Hydroxyzine didn't help jitteriness.  Feels some better.  Fleeting SI & HI but not obsessive like it was prior to hospitalization.  Feels a little amped up and nervous.  Scared of what could happen.  Apprehensive  being here talking about things. 5-6 hours sleep last night and would like to have more. At mom and dad's house right now and up more in the day. Inadvertently stopped lorazepam abruptly by mistake likely contributing to shakiness. Still some memory issues.  Trying to stay out of bed when watching TV.  Some awakening but managing. Occ fleeting SI and distracts himself.   Always spends a lot of time just laying around.   Can have anxiety for no reason like coming here, and varies in intensity without pattern.      Less panic than in the past.  Some chronic depression and hyperactivity and loudness and hyperverbal.  Usually back to sleep.  Total 6 hours but some napping, awakens 2-4 times nightly but back to sleep..  Taking quetiapine 600 mg HS.  still lacks interest and motivation.  Can follow a TV show if interested. Plan: Restart lorazepam 1 mg in the morning, 1 mg in the afternoon and 1 mg at night for anxiety Stop hydroxyzine  Increase olanzapine to 1 and 1/2 of 10 mg tablets in evening  09/05/2020 appt noted: seen with parents today at his request Made med changes noted and tolerated the changes Better than I did last week. Now only fleeting HI/SI and "no where near what it was".  Sleeping better.  Still not a  lot of energy.  Anhedonia.  Less agitation.  A lot of anxiety all the time but it's helping.  Would like more energy and motivation.   Doesn't handle stress well. Parents note he's less shakey.  He's still staying with his parents.  Only driven once since this happened and F wants him to be able to drive and function more independently.  M agrees he's better.  Memory is better but not normal.  Primarily STM problems No akathisia with olanzapine this time.    Administering his own meds but mo watched. Slept 8 hours last night. Plan consider further reductions in quetiapine  10/17/20 appt noted: Stayed on Seroquel 600 HS and olanzapine 15. Olanzapine seemed to do most for the sleep.  Also helping eliminated HI/SI for the most part.  Wants to try to increase it.  Depression is much better with less rumination also.  Fears akathisia at higher dose as in the past. Also on fluoxetine 40.   Plan: Reduce Seroquel to 1 and 1/2 tablets at night Increase olanzapine to 1 of the 15 mg tablet and 1/2 of the 5 mg tablet for 1 week,  Then, if tolerated increase the olanzapine to 20 mg or 1 of the 15 mg tablets and 1 of the 5 mg tablets. If possible then also reduce quetiapine to 1 tablet at night.  11/05/2020 appointment with the following noted: Up to olanzapine 20 mg hs for 3 days.  Reduced Serooquel to 450 mg HS.  Didn't try to go lower. Had hiatal hernia and umbilical hernia surgery recently with no problem.   No akathisia so far. Sleep ok with meds so far changes.  No mood changes yet but overall likes the smoothness of olanzapine and helping sleep without knociing him out. Still some occ HI/SI, he thinks bc of anxiety generally.  Easily anxious. Plan: Continue olanzapine 20 mg nightly longer to give it time to help mood sx reduce quetiapine to 1 tablet at night to minimize polypharmacy and reduce risk of akathisia.  01/07/21 appt noted: Able to reduce quetiapine 300 mg HS ok with decent sleep but still  wakes a lot.  No AM hangover.  Don't have a lot to do.  Can enjoy going out and doing things.  But doesn't do it longterm.  Family is doing good.  Father started year 31 at his job.   Tolerating the meds well.  May wake more with quetiapine reduction. Lost 10#. Wonders if could try other meds he didn't tolerate before bc now can tolerate olanzapine and couldn't tolerate it in the past DT akathisia. Mood never great but can swing into depresssion without reason.    Psych med hx extensive including ECT and  risperidone, lithium 1200 SE, Zyprexa 20-15 akathisia, Latuda 80 which caused akathisia, Abilify, Vraylar, Rexulti, aripiprazole 20 mg with akathisia, Seroquel 1000 mg,  InVega, Geodon, Saphris with side effects, symbyax, Fanapt NR .  Caplyta SE and markedly worse. lamotrigine 300 mg, Depakote 2000 mg, Tegretol,Trileptal and several of these in combinations, gabapentin, N-acetylcysteine, Nuedexta,    Belsomra with no response,  Lunesta no response, trazodone 200 mg, Xanax, clonazepam, lorazepam less sedation. clonidine, Viibryd 40 mg for 3 months with diarrhea, protriptyline with side effects, Trintellix 20 mg, Parnate 50 mg with no response,  imipramine, venlafaxine,  Emsam 12 mg for 2 months,  bupropion was side effects,  Lexapro 20 mg, sertraline, paroxetine, Deplin, fluoxetine 80 methylphenidate 60 mg,  Vyvanse, Concerta, strattera, , modafinil,  pramipexole,  amantadine , Patient prone to akathisia.  Review of Systems:  Review of Systems  Cardiovascular:  Negative for palpitations.  Gastrointestinal:  Negative for abdominal pain.  Musculoskeletal:  Positive for back pain.  Neurological:  Positive for tremors. Negative for dizziness, weakness, light-headedness and headaches.       Fidgety  Psychiatric/Behavioral:  Positive for decreased concentration. Negative for agitation, behavioral problems, confusion, dysphoric mood, hallucinations, self-injury, sleep disturbance and suicidal ideas.  The patient is nervous/anxious and is hyperactive.    Medications: I have reviewed the patient's current medications.  Current Outpatient Medications  Medication Sig Dispense Refill   acetaminophen (TYLENOL) 500 MG tablet Take 1,000 mg by mouth every 6 (six) hours as needed for moderate pain.     esomeprazole (NEXIUM) 40 MG capsule Take 40 mg by mouth every morning.     fenofibrate (TRICOR) 145 MG tablet Take 145 mg by mouth daily.     FLUoxetine (PROZAC) 40 MG capsule TAKE 1 CAPSULE BY MOUTH DAILY. 90 capsule 0   fluticasone (FLONASE) 50 MCG/ACT nasal spray Place 1 spray into both nostrils daily as needed for allergies or rhinitis.     hydrocortisone (ANUSOL-HC) 2.5 % rectal cream Place 1 application rectally 3 (three) times daily as needed for hemorrhoids.     levothyroxine (SYNTHROID, LEVOTHROID) 75 MCG tablet Take 75 mcg by mouth daily before breakfast.     lisinopril (ZESTRIL) 5 MG tablet Take 5 mg by mouth daily.     lithium carbonate 300 MG capsule Take 2 capsules (600 mg total) by mouth daily. 180 capsule 1   LORazepam (ATIVAN) 1 MG tablet Take 1 tablet (1 mg total) by mouth every 8 (eight) hours. 90 tablet 3   meclizine (ANTIVERT) 25 MG tablet Take 25 mg by mouth 3 (three) times daily as needed for dizziness.     OLANZapine (ZYPREXA) 20 MG tablet TAKE 1 TABLET BY MOUTH AT BEDTIME. 30 tablet 1   QUEtiapine (SEROQUEL) 300 MG tablet TAKE 2 TABLETS BY MOUTH AT BEDTIME (Patient taking differently: 1 tab HS) 60 tablet 1   QUEtiapine (SEROQUEL) 50 MG tablet Take 1 tablet (50 mg total) by mouth  at bedtime. 30 tablet 1   sildenafil (REVATIO) 20 MG tablet Take 5 tablets (100 mg total) by mouth daily as needed (ED). 30 tablet 11   tamsulosin (FLOMAX) 0.4 MG CAPS capsule Take 1 capsule (0.4 mg total) by mouth daily. 90 capsule 3   pregabalin (LYRICA) 50 MG capsule Take 1 capsule (50 mg total) by mouth 3 (three) times daily for 14 days. 42 capsule 0   No current facility-administered medications  for this visit.   Medication Side Effects: Other: mild sleepiness.   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    Hypothyroidism    Pneumonia    PONV (postoperative nausea and vomiting)     Family History  Problem Relation Age of Onset   Prostate cancer Neg Hx    Bladder Cancer Neg Hx    Kidney cancer Neg Hx     Social History   Socioeconomic History   Marital status: Single    Spouse name: Not on file   Number of children: Not on file   Years of education: Not on file   Highest education level: Not on file  Occupational History   Not on file  Tobacco Use   Smoking status: Former    Types: Cigarettes   Smokeless tobacco: Former    Types: Associate Professor Use: Never used  Substance and Sexual Activity   Alcohol use: Not Currently   Drug use: Never   Sexual activity: Yes  Other Topics Concern   Not on file  Social History Narrative   Not on file   Social Determinants of Health   Financial Resource Strain: Not on file  Food Insecurity: Not on file  Transportation Needs: Not on file  Physical Activity: Not on file  Stress: Not on file  Social Connections: Not on file  Intimate Partner Violence: Not on file    Past Medical History, Surgical history, Social history, and Family history were reviewed and updated as appropriate.   Please see  review of systems for further details on the patient's review from today.   Objective:   Physical Exam:  There were no vitals taken for this visit.  Physical Exam Constitutional:      General: He is not in acute distress.    Appearance: He is well-developed.  Musculoskeletal:        General: No deformity.  Neurological:     Mental Status: He is alert and oriented to person, place, and time.     Cranial Nerves: No dysarthria.     Motor: Tremor present.     Coordination: Coordination normal.     Comments: Slight tremor  Psychiatric:        Attention and Perception:  Attention and perception normal. He does not perceive auditory or visual hallucinations.        Mood and Affect: Mood is anxious and depressed. Affect is not labile, blunt, angry, tearful or inappropriate.        Speech: Speech normal. Speech is not slurred.        Behavior: Behavior normal. Behavior is not slowed. Behavior is cooperative.        Thought Content: Thought content is not paranoid or delusional. Thought content does not include homicidal or suicidal ideation. Thought content does not include homicidal or suicidal plan.        Cognition and Memory: Cognition normal. He exhibits impaired recent memory.        Judgment: Judgment normal.     Comments: Insight fair He is chronically anxious   But better than last visit No manic signs noted. Some wordfinding problems ongoing. No sui or homocidal intent or plan, but occ fleeting thoughts continue    Lab Review:     Component Value Date/Time   NA 138 10/24/2020 0502   NA 142 03/20/2014 0514   K 4.3 10/24/2020 0502   K 4.1 03/20/2014 0514   CL 109 10/24/2020 0502   CL 112 (H) 03/20/2014 0514   CO2 19 (L) 10/24/2020 0502   CO2 26 03/20/2014 0514   GLUCOSE 136 (H) 10/24/2020 0502   GLUCOSE 99 03/20/2014 0514   BUN 23 (H) 10/24/2020 0502   BUN 7 03/20/2014 0514   CREATININE 1.75 (H) 10/24/2020 0502   CREATININE 0.78 03/20/2014 0514   CALCIUM 8.7 (L)  10/24/2020 0502   CALCIUM 9.5 03/20/2014 0514   PROT 7.8 08/11/2020 1750   PROT 7.3 03/18/2014 1459   ALBUMIN 4.7 08/11/2020 1750   ALBUMIN 3.2 (L) 03/18/2014 1459   AST 23 08/11/2020 1750   AST 16 03/18/2014 1459   ALT 16 08/11/2020 1750   ALT 16 03/18/2014 1459   ALKPHOS 34 (L) 08/11/2020 1750   ALKPHOS 105 03/18/2014 1459   BILITOT 0.5 08/11/2020 1750   BILITOT 0.3 03/18/2014 1459   GFRNONAA 45 (L) 10/24/2020 0502   GFRNONAA >60 03/20/2014 0514   GFRAA 54 (L) 04/18/2019 1612   GFRAA >60 03/20/2014 0514       Component Value Date/Time   WBC 12.3 (H) 10/24/2020 0502   RBC 3.76 (L) 10/24/2020 0502   HGB 11.9 (L) 10/24/2020 0502   HGB 12.6 (L) 03/20/2014 0514   HCT 37.1 (L) 10/24/2020 0502   HCT 38.4 (L) 03/20/2014 0514   PLT 328 10/24/2020 0502   PLT 346 03/20/2014 0514   MCV 98.7 10/24/2020 0502   MCV 89 03/20/2014 0514   MCH 31.6 10/24/2020 0502   MCHC 32.1 10/24/2020 0502   RDW 13.1 10/24/2020 0502   RDW 12.5 03/20/2014 0514   LYMPHSABS 3.2 03/20/2014 0514   MONOABS 1.0 03/20/2014 0514   EOSABS 0.4 03/20/2014 0514   BASOSABS 0.1 03/20/2014 0514    Lithium Lvl  Date Value Ref Range Status  08/11/2020 0.95 0.60 - 1.20 mmol/L Final    Comment:    Performed at [facility], [address], [place], [place] [address]    Last lithium level Sept 0.8.   Last lithium level July 27, 2018 was normal at 1.0.   Lithium level [facility] October 03, 2018 = 1.2. Said he got lithium level at [facility] as requested.  Labs not in Epic.  Recent lipids ok except higher TG than usual.  Normal A1C.  No  results found for: PHENYTOIN, PHENOBARB, VALPROATE, CBMZ   .res Assessment: Plan:    Caswell was seen today for follow-up, severe bipolar i disorder and depression.  Diagnoses and all orders for this visit:  Severe bipolar I disorder, current or most recent episode depressed (HCC) -     QUEtiapine (SEROQUEL) 50 MG tablet; Take 1 tablet (50 mg total) by mouth at  bedtime.  Generalized anxiety disorder  Panic disorder with agoraphobia  Insomnia due to mental condition -     QUEtiapine (SEROQUEL) 50 MG tablet; Take 1 tablet (50 mg total) by mouth at bedtime.  Lithium use   Chronic TR bipolar mixed and chronic anxiety.  He usually has mixed bipolar symptoms which we have not been able to completely eliminate.  See long list of meds tried.   He is somewhat chronically unstable but better on olanzapine and off Caplyta.  Discussed mother's concerns about his memory which is primarily a short-term memory issue.  We discussed the alternative options of using Namenda off label for mild cognitive impairment.  We are not yet able to reduce the dose of lorazepam but that could potentially help as well.  His memory was much worse on Xanax than lorazepam. We discussed the short-term risks associated with benzodiazepines including sedation and increased fall risk among others.  Discussed long-term side effect risk including dependence, potential withdrawal symptoms, and the potential eventual dose-related risk of dementia. Disc newer studies refute dementia risks.  Unfortunately due to the severity of set his symptoms polypharmacy is a necessity.  Disc unusual combo antipsychotics but it helps more than other options.  Discussed potential metabolic side effects associated with atypical antipsychotics, as well as potential risk for movement side effects. Advised pt to contact office if movement side effects occur.  He had some falling around the time when he previously took olanzapine but he took it for several months at this dosage before he had any side effect issues and he was on Xanax at the time that the following occurred. Take LED Seroquel to sleep.   No obvious alternatives for sleep  Lithium being used bc chronic SI and death thoughts.   Counseled patient regarding potential benefits, risks, and side effects of lithium to include potential risk of lithium  affecting thyroid and renal function.  Discussed need for periodic lab monitoring to determine drug level and to assess for potential adverse effects.  Counseled patient regarding signs and symptoms of lithium toxicity and advised that they notify office immediately or seek urgent medical attention if experiencing these signs and symptoms.  Patient advised to contact office with any questions or concerns. Continue lithium 2 of the 300 mg capsules. Lithium level 0.8.  On 750 mg daily.   Call if death thoughts worsen or worsening SI. Prefer only 1 med change at a time.  Continue lorazepam 1 mg in the morning, 1 mg in the afternoon and 1 mg at night for anxiety  Previously he had difficulty tolerating 15 mg of olanzapine due to akathisia but he is not having akathisia at this time on 20 mg daily.  Disc SE. Disc combo with Seroquel may lower response rate but he doesn't think he can sleep without Seroquel. Will try to go as low as possible if not off of it eventually.  Will go down again.    Continue olanzapine 20 mg nightly longer to give it time to help mood sx reduce quetiapine to 1/2 of the 300 mg  tablet at night  to minimize polypharmacy and reduce risk of akathisia. Disc WD  and may need to go lower before he can stop it.  We discussed the short-term risks associated with benzodiazepines including sedation and increased fall risk among others.  Discussed long-term side effect risk including dependence, potential withdrawal symptoms, and the potential eventual dose-related risk of dementia.  But recent studies from 2020 dispute this association between benzodiazepines and dementia risk. Newer studies in 2020 do not support an association with dementia.  Discussed safety plan at length with patient.  Advised patient to contact office with any worsening signs and symptoms.  In[facility] if experiencing any acute safety concerns, to include  suicidal intent.  Disc Ozempic for weight loss.  Has had weight gain from meds as a contributor. But lately has lost some weight.  Needs to keep up activity as much as possible for depression.  This appt was 30 mins.  FU 4 weeks   Meredith Staggersarey Cottle, MD, DFAPA  Future Appointments  Date Time Provider Department Center  02/02/2021  1:30 PM Cottle, Steva Readyarey G Jr., MD [facility] None  12/30/2021  2:45 PM Sondra ComeSninsky, Brian C, MD [facility] None    No orders of the defined types were placed in this encounter.     -------------------------------

## 2021-01-15 ENCOUNTER — Other Ambulatory Visit: Payer: Self-pay | Admitting: Psychiatry

## 2021-01-15 ENCOUNTER — Telehealth: Payer: Self-pay | Admitting: Psychiatry

## 2021-01-15 MED ORDER — QUETIAPINE FUMARATE 200 MG PO TABS: 200.0000 mg | ORAL_TABLET | Freq: Every day | ORAL | 0 refills | Status: DC

## 2021-01-15 NOTE — Telephone Encounter (Signed)
OK sent.

## 2021-01-15 NOTE — Telephone Encounter (Signed)
Asking for 200 mg instead of going down to 150 mg Seroquel?

## 2021-01-15 NOTE — Telephone Encounter (Signed)
Patient called in today stating that after last visit 9/7 his  Seroquel was decreased from 300mg  to 150mg  by breaking the tablet in half. He says he is having withdrawals and would like to know if there is a 200mg  tablet he could take instead. Pls rtc he would like to discuss. Ph: [phone].

## 2021-01-16 NOTE — Telephone Encounter (Signed)
Pt called and wants nurse to call him back regarding the withdrawal he is having from Seroquel.

## 2021-01-16 NOTE — Telephone Encounter (Signed)
Jeffery called again this morning requesting a call back from the nurse to discuss this.

## 2021-01-19 NOTE — Telephone Encounter (Signed)
Pt informed.He wanted me to stress the fact that he has called several times since Thursday and was in a crisis,and that nobody called him back until today.He was not appreciative of that.

## 2021-01-19 NOTE — Telephone Encounter (Signed)
Pt stated he moved his dose back to 300 mg at night due to having some suicidal thoughts when taking the 150 mg.He stated he has a bottle of 200 mg and a bottle of 50 mg.He is asking to take 250 mg instead of 200 to reduce the dose more slowly due to the symptoms he is having.If you agree, he wants to continue taking the 300 mg dose until next week,then start the 250 mg because he will be out of town for a funeral this week.

## 2021-01-19 NOTE — Telephone Encounter (Signed)
I agree.  He can reduce the Seroquel dose more slowly by going from 300 mg to 250 mg daily whenever he is ready to do it.  Send in whenever prescriptions he needs to accomplish this but only send in 30-day quantities.

## 2021-02-02 ENCOUNTER — Ambulatory Visit: Payer: Medicare PPO | Admitting: Psychiatry

## 2021-02-02 ENCOUNTER — Other Ambulatory Visit: Payer: Self-pay

## 2021-02-02 ENCOUNTER — Encounter: Payer: Self-pay | Admitting: Psychiatry

## 2021-02-02 DIAGNOSIS — F411 Generalized anxiety disorder: Secondary | ICD-10-CM | POA: Diagnosis not present

## 2021-02-02 DIAGNOSIS — F314 Bipolar disorder, current episode depressed, severe, without psychotic features: Secondary | ICD-10-CM | POA: Diagnosis not present

## 2021-02-02 DIAGNOSIS — F5105 Insomnia due to other mental disorder: Secondary | ICD-10-CM

## 2021-02-02 DIAGNOSIS — F4001 Agoraphobia with panic disorder: Secondary | ICD-10-CM | POA: Diagnosis not present

## 2021-02-02 DIAGNOSIS — Z79899 Other long term (current) drug therapy: Secondary | ICD-10-CM

## 2021-02-02 DIAGNOSIS — R6889 Other general symptoms and signs: Secondary | ICD-10-CM

## 2021-02-02 MED ORDER — QUETIAPINE FUMARATE 200 MG PO TABS: 200.0000 mg | ORAL_TABLET | Freq: Every day | ORAL | 1 refills | Status: DC

## 2021-02-02 MED ORDER — QUETIAPINE FUMARATE 50 MG PO TABS: 50.0000 mg | ORAL_TABLET | Freq: Every day | ORAL | 0 refills | Status: DC

## 2021-02-02 NOTE — Patient Instructions (Addendum)
Reduce quetiapine by 50 mg every 2-4 weeks.

## 2021-02-02 NOTE — Progress Notes (Signed)
Albert [name] [id number] 1966/04/15 55 y.o.    Subjective:   Patient ID:  Albert [name] is a 55 y.o. (DOB Mar 19, 1966) male.  Chief Complaint:  Chief Complaint  Patient presents with   Follow-up   Depression   Anxiety   Sleeping Problem    Depression        Associated symptoms include decreased concentration.  Associated symptoms include no headaches and no suicidal ideas.  Past medical history includes anxiety.   Anxiety Symptoms include decreased concentration and nervous/anxious behavior. Patient reports no confusion, dizziness, palpitations or suicidal ideas.     Albert [name] presents to the office today for follow-up of mixed bipolar, anxiety and still grieving stress of breakup.  He requires frequent follow-up because of long-term symptoms.  At visit October 25, 2018.  We made several medicine changes because of his concerns about sleep and other issues.  We increased olanzapine back to 7.5 mg bc not sleeping as well at 5 mg.  He has to have the prescription for quetiapine written for up to 3 tablets at night in case insomnia is worse because insomnia makes his mood disorder so much worse.  We discussed that was above the usual max but the request was granted given his treatment resistant status. We also reduce lithium from 900 mg daily to 750 mg daily to try to reduce tremor and muscle twitches.  At  visit March 28, 2019.  Fluoxetine was increased to 40 mg daily.   Early January 2021 increased to 60 mg daily.  No SE.  seen June 28, 2019.  No further meds were changed except olanzapine was increased back to 10 mg daily to see if if he could get additional benefit per his request.  Covid vaccinated.  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  Then had cholecystectomy.  April 2021 appt with the following noted: 2 episodes night sweats lately.  Memory has been very bad lately.  Repeatedly asks mother questions. Still  tend to stay in his room and his bed.  Still has rapid cycling mood  swings.  Maybe some better with increase in olanzapine to 10 and tolerating itl.  Would like to get out more but can't DT Covid.  Can perform necessary chores.  Will get out of the house when he can.  No change in death thoughts and anxiety in intensity but is better with frequency.  Usually comes and goes in waves but more persistent.  Consistent with meds.  Ativan not helping anxiety very dramatically but he's not sure.  Fidgety.  No trigger other than still grieving relationship and can't get it out of his head.  Poor energy, concentration and more forgetful. In bed more and less active.  Sleep ok lately which is unusual.  Can concentrate on financial matters and stock market at times.  Tolerating meds.  Still some intrusive SI without reason. Less frequent obsessive thoughts about broken relationship and has been doing this for months.  Can't let it go.  Loop.   No unusual stress even with the family who is supportive. Likes the benefit that Zyprexa gives. No sleepwalking nor falling nor odd behavior.  No history of sleep walking.  He understands this may recur with an increase.  Also wants option to rarely take extra quetiapine 300 for sleep prn. Plan:  Try to reduce lorazepam if possible.  10/22/2019 appointment with the following noted: Continues fluoxetine 60, lithium 600 mg, lorazepam 2 mg AM and HS and 1  mg midday, olanzapine 10, quetiapine 600 mg HS. Still anxious chronically and including driving in crowded spaces.  Doesn't thing Ativan helps as well as Xanax but less cognitive problems. Sleep is not as good.  Occ EFA.  More EMA and wanting to do things in the middle of the night but this is not typical.  Wonders about why that happens.  Some napping.   Tolerating meds.  Asks about weight loss meds.  Disc this in detail.   Plan no changes except OK meclizine prn vertigo.  02/11/20 appt with the following noted: Able to gradually reduce lorazepam to 1 mg AM and HS. More depressed over time  and less interested in things and less interest in going out but does with his parents. Has reduced from 800 to 600 mg HS with some awakening but usually able to go back to sleep.[name]  Spending a good amount of time in bed bc watches TV in bedroom.  Parents watch TV in different part of the house.  No mood swings he notices.   Concerns about weight gain about 200#. Likes olanzapine's benefit for sleep. Plan: Trial for TRD to  Increase fluoxetine to 80 mg daily  to use the combo with olanzapine for TR bipolar depression.   04/21/20 appt with following noted: I thought in beginning some benefit with fluoxetine.  Lifelong negative thinking continues.   Not much bipolar.  Reads a lot on bipolar.   Still tired and anxious a little more may be seasonal.  No familial stressors.  Tends to lay down in afternoon.  Anxiety not over anything in particular.   No SE with fluoxetine. Took meclizine prn.  Easily motion sick.    Asks questions about newer drugs for bipolar like Caplyta. Plan:  No med changes  06/30/20 appt noted: Tired of wearing masks.  Vaccinated.  Asked questions about when this will end with mask mandate.  Mood up and down some since here and getting up 2-3 times.  Disc awakening problems.  Trying to do better bc night eating some.   Mood is about average today so far.  [name] out with friends for breakfast.  Recognizes activity helps mood.   3 days in a row napped a lot in the afternoon.   Bed is a comfort place.  Gets bored and hard to motivate.  Tries to stay away from night eating and spending.   More depressed when alone and wonders about med changes. Plan: Failed response to olanzapine + fluoxetine for TR bipolar depression.   Reduce fluoxetine to 1 daily and reduce olanzapine to one half nightly for 10 days and then stop it. Then start Caplyta 1 daily  08/27/2020 appointment with the following noted: Patient decompensated with the above switch to Caplyta with intrusive suicidal thoughts  and had to be hospitalized for psychiatric reasons.  Multiple phone calls with family members since that time.  Spoke with the psychiatric nurse practitioner with the decision to restart olanzapine in place of the Caplyta given the patient was more stable while on the combination of Seroquel and olanzapine than with the Caplyta. Caplyta triggered HI/SI and depressed and confused on it, and agitated and still has some of it now. No akathisia. Frustrated with lack of therapy at the hospital.   Hydroxyzine didn't help jitteriness.  Feels some better.  Fleeting SI & HI but not obsessive like it was prior to hospitalization.  Feels a little amped up and nervous.  Scared of what could happen.  Apprehensive being here talking about things. 5-6 hours sleep last night and would like to have more. At mom and dad's house right now and up more in the day. Inadvertently stopped lorazepam abruptly by mistake likely contributing to shakiness. Still some memory issues.  Trying to stay out of bed when watching TV.  Some awakening but managing. Occ fleeting SI and distracts himself.   Always spends a lot of time just laying around.   Can have anxiety for no reason like coming here, and varies in intensity without pattern.      Less panic than in the past.  Some chronic depression and hyperactivity and loudness and hyperverbal.  Usually back to sleep.  Total 6 hours but some napping, awakens 2-4 times nightly but back to sleep..  Taking quetiapine 600 mg HS.  still lacks interest and motivation.  Can follow a TV show if interested. Plan: Restart lorazepam 1 mg in the morning, 1 mg in the afternoon and 1 mg at night for anxiety Stop hydroxyzine  Increase olanzapine to 1 and 1/2 of 10 mg tablets in evening  09/05/2020 appt noted: seen with parents today at his request Made med changes noted and tolerated the changes Better than I did last week. Now only fleeting HI/SI and "no where near what it was".  Sleeping better.   Still not a lot of energy.  Anhedonia.  Less agitation.  A lot of anxiety all the time but it's helping.  Would like more energy and motivation.   Doesn't handle stress well. Parents note he's less shakey.  He's still staying with his parents.  Only driven once since this happened and F wants him to be able to drive and function more independently.  M agrees he's better.  Memory is better but not normal.  Primarily STM problems No akathisia with olanzapine this time.    Administering his own meds but mo watched. Slept 8 hours last night. Plan consider further reductions in quetiapine  10/17/20 appt noted: Stayed on Seroquel 600 HS and olanzapine 15. Olanzapine seemed to do most for the sleep.  Also helping eliminated HI/SI for the most part.  Wants to try to increase it.  Depression is much better with less rumination also.  Fears akathisia at higher dose as in the past. Also on fluoxetine 40.   Plan: Reduce Seroquel to 1 and 1/2 tablets at night Increase olanzapine to 1 of the 15 mg tablet and 1/2 of the 5 mg tablet for 1 week,  Then, if tolerated increase the olanzapine to 20 mg or 1 of the 15 mg tablets and 1 of the 5 mg tablets. If possible then also reduce quetiapine to 1 tablet at night.  11/05/2020 appointment with the following noted: Up to olanzapine 20 mg hs for 3 days.  Reduced Serooquel to 450 mg HS.  Didn't try to go lower. Had hiatal hernia and umbilical hernia surgery recently with no problem.   No akathisia so far. Sleep ok with meds so far changes.  No mood changes yet but overall likes the smoothness of olanzapine and helping sleep without knociing him out. Still some occ HI/SI, he thinks bc of anxiety generally.  Easily anxious. Plan: Continue olanzapine 20 mg nightly longer to give it time to help mood sx reduce quetiapine to 1 tablet at night to minimize polypharmacy and reduce risk of akathisia.  01/07/21 appt noted: Able to reduce quetiapine 300 mg HS ok with decent sleep  but still wakes a  lot.  No AM hangover.  Don't have a lot to do.  Can enjoy going out and doing things.  But doesn't do it longterm.  Family is doing good.  Father started year 66 at his job.   Tolerating the meds well.  May wake more with quetiapine reduction. Lost 10#. Wonders if could try other meds he didn't tolerate before bc now can tolerate olanzapine and couldn't tolerate it in the past DT akathisia. Mood never great but can swing into depresssion without reason.   Plan: Continue olanzapine 20 mg nightly longer to give it time to help mood sx reduce quetiapine to 1/2 of the 300 mg  tablet at night to minimize polypharmacy and reduce risk of akathisia.  02/02/2021 appointment with the following noted: Tried reduced quetiapine to 150 mg and after a week had withdrawal sx including jittery and SI and he increased to 250 mg daily.  Felt better after increasing to 300 mg daily and last week reduced to 250 mg daily.  After increase quetiapine felt better in a few days.  Seems more alert with less quetiapine but can't tolerate a quick withdrawal.   Some awakening.   Psych med hx extensive including ECT and  risperidone, lithium 1200 SE, Zyprexa 20-15 akathisia, Latuda 80 which caused akathisia, Abilify, Vraylar, Rexulti, aripiprazole 20 mg with akathisia, Seroquel 1000 mg,  InVega, Geodon, Saphris with side effects, symbyax, Fanapt NR .  Caplyta SE and markedly worse. lamotrigine 300 mg, Depakote 2000 mg, Tegretol,Trileptal and several of these in combinations, gabapentin, N-acetylcysteine, Nuedexta,    Belsomra with no response,  Lunesta no response, trazodone 200 mg, Xanax, clonazepam, lorazepam less sedation. clonidine, Viibryd 40 mg for 3 months with diarrhea, protriptyline with side effects, Trintellix 20 mg, Parnate 50 mg with no response,  imipramine, venlafaxine,  Emsam 12 mg for 2 months,  bupropion was side effects,  Lexapro 20 mg, sertraline, paroxetine, Deplin, fluoxetine  80 methylphenidate 60 mg,  Vyvanse, Concerta, strattera, , modafinil,  pramipexole,  amantadine , Patient prone to akathisia.  Review of Systems:  Review of Systems  Cardiovascular:  Negative for palpitations.  Gastrointestinal:  Negative for abdominal pain.  Musculoskeletal:  Positive for back pain.  Neurological:  Positive for tremors. Negative for dizziness, weakness, light-headedness and headaches.       Fidgety  Psychiatric/Behavioral:  Positive for decreased concentration. Negative for agitation, behavioral problems, confusion, dysphoric mood, hallucinations, self-injury, sleep disturbance and suicidal ideas. The patient is nervous/anxious and is hyperactive.    Medications: I have reviewed the patient's current medications.  Current Outpatient Medications  Medication Sig Dispense Refill   acetaminophen (TYLENOL) 500 MG tablet Take 1,000 mg by mouth every 6 (six) hours as needed for moderate pain.     esomeprazole (NEXIUM) 40 MG capsule Take 40 mg by mouth every morning.     fenofibrate (TRICOR) 145 MG tablet Take 145 mg by mouth daily.     FLUoxetine (PROZAC) 40 MG capsule Take 1 capsule (40 mg total) by mouth daily. 90 capsule 0   fluticasone (FLONASE) 50 MCG/ACT nasal spray Place 1 spray into both nostrils daily as needed for allergies or rhinitis.     hydrocortisone (ANUSOL-HC) 2.5 % rectal cream Place 1 application rectally 3 (three) times daily as needed for hemorrhoids.     levothyroxine (SYNTHROID, LEVOTHROID) 75 MCG tablet Take 75 mcg by mouth daily before breakfast.     lisinopril (ZESTRIL) 5 MG tablet Take 5 mg by mouth daily.  lithium carbonate 300 MG capsule Take 2 capsules (600 mg total) by mouth daily. 180 capsule 1   LORazepam (ATIVAN) 1 MG tablet Take 1 tablet (1 mg total) by mouth every 8 (eight) hours. 90 tablet 3   meclizine (ANTIVERT) 25 MG tablet Take 25 mg by mouth 3 (three) times daily as needed for dizziness.     OLANZapine (ZYPREXA) 20 MG tablet Take 1  tablet (20 mg total) by mouth at bedtime. 30 tablet 1   QUEtiapine (SEROQUEL) 200 MG tablet Take 1 tablet (200 mg total) by mouth at bedtime. (Patient taking differently: Take 250 mg by mouth at bedtime.) 30 tablet 0   sildenafil (REVATIO) 20 MG tablet Take 5 tablets (100 mg total) by mouth daily as needed (ED). 30 tablet 11   tamsulosin (FLOMAX) 0.4 MG CAPS capsule Take 1 capsule (0.4 mg total) by mouth daily. 90 capsule 3   pregabalin (LYRICA) 50 MG capsule Take 1 capsule (50 mg total) by mouth 3 (three) times daily for 14 days. 42 capsule 0   No current facility-administered medications for this visit.   Medication Side Effects: Other: mild sleepiness.   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    Hypothyroidism    Pneumonia    PONV (postoperative nausea and vomiting)     Family History  Problem Relation Age of Onset   Prostate cancer Neg Hx    Bladder Cancer Neg Hx    Kidney cancer Neg Hx     Social History   Socioeconomic History   Marital status: Single    Spouse name: Not on file   Number of children: Not on file   Years of education: Not on file   Highest education level: Not on file  Occupational History   Not on file  Tobacco Use   Smoking status: Former    Types: Cigarettes   Smokeless tobacco: Former    Types: Associate Professor Use:  Never used  Substance and Sexual Activity   Alcohol use: Not Currently   Drug use: Never   Sexual activity: Yes  Other Topics Concern   Not on file  Social History Narrative   Not on file   Social Determinants of Health   Financial Resource Strain: Not on file  Food Insecurity: Not on file  Transportation Needs: Not on file  Physical Activity: Not on file  Stress: Not on file  Social Connections: Not on file  Intimate Partner Violence: Not on file    Past Medical History, Surgical history, Social history, and Family history were reviewed and updated as appropriate.   Please see review of systems for further details on the patient's review from today.   Objective:  Physical Exam:  There were no vitals taken for this visit.  Physical Exam Constitutional:      General: He is not in acute distress.    Appearance: He is well-developed.  Musculoskeletal:        General: No deformity.  Neurological:     Mental Status: He is alert and oriented to person, place, and time.     Cranial Nerves: No dysarthria.     Motor: Tremor present.     Coordination: Coordination normal.     Comments: Slight tremor  Psychiatric:        Attention and Perception: Attention and perception normal. He does not perceive auditory or visual hallucinations.        Mood and Affect: Mood is anxious and depressed. Affect is not labile, blunt, angry, tearful or inappropriate.        Speech: Speech normal. Speech is not slurred.        Behavior: Behavior normal. Behavior is not slowed. Behavior is cooperative.        Thought Content: Thought content is not paranoid or delusional. Thought content does not include homicidal or suicidal ideation. Thought content does not include homicidal or suicidal plan.        Cognition and Memory: Cognition normal. He exhibits impaired recent memory.        Judgment: Judgment normal.     Comments: Insight fair He is chronically anxious   But better than last visit No  manic signs noted. Some wordfinding problems ongoing. No sui or homocidal intent or plan, but occ fleeting thoughts continue    Lab Review:     Component Value Date/Time   NA 138 10/24/2020 0502   NA 142 03/20/2014 0514   K 4.3 10/24/2020 0502   K 4.1 03/20/2014 0514   CL 109 10/24/2020 0502   CL 112 (H) 03/20/2014 0514   CO2 19 (L) 10/24/2020 0502   CO2 26 03/20/2014 0514   GLUCOSE 136 (H) 10/24/2020 0502   GLUCOSE 99 03/20/2014 0514   BUN 23 (H) 10/24/2020 0502   BUN 7 03/20/2014 0514   CREATININE 1.75 (H) 10/24/2020 0502   CREATININE 0.78 03/20/2014 0514   CALCIUM 8.7 (L) 10/24/2020 0502   CALCIUM 9.5 03/20/2014 0514   PROT 7.8 08/11/2020 1750   PROT 7.3 03/18/2014 1459   ALBUMIN 4.7 08/11/2020 1750   ALBUMIN 3.2 (L) 03/18/2014 1459   AST 23 08/11/2020 1750   AST 16 03/18/2014 1459   ALT 16 08/11/2020 1750   ALT 16 03/18/2014 1459   ALKPHOS 34 (L) 08/11/2020 1750   ALKPHOS 105 03/18/2014 1459   BILITOT 0.5 08/11/2020 1750   BILITOT 0.3 03/18/2014 1459   GFRNONAA 45 (L) 10/24/2020 0502   GFRNONAA >60 03/20/2014 0514   GFRAA 54 (L) 04/18/2019 1612   GFRAA >60 03/20/2014 0514       Component Value Date/Time   WBC 12.3 (H) 10/24/2020 0502   RBC 3.76 (L) 10/24/2020 0502   HGB 11.9 (L) 10/24/2020 0502   HGB 12.6 (L) 03/20/2014 0514   HCT 37.1 (L) 10/24/2020 0502   HCT 38.4 (L) 03/20/2014 0514   PLT 328 10/24/2020 0502   PLT 346 03/20/2014 0514   MCV 98.7 10/24/2020 0502   MCV 89 03/20/2014 0514   MCH 31.6 10/24/2020 0502   MCHC 32.1 10/24/2020 0502   RDW 13.1 10/24/2020 0502   RDW 12.5 03/20/2014 0514   LYMPHSABS 3.2 03/20/2014 0514   MONOABS 1.0 03/20/2014 0514   EOSABS  0.4 03/20/2014 0514   BASOSABS 0.1 03/20/2014 0514    Lithium Lvl  Date Value Ref Range Status  08/11/2020 0.95 0.60 - 1.20 mmol/L Final    Comment:    Performed at [facility], [address] [place], [place] [address]    Last lithium level Sept 0.8.   Last lithium  level July 27, 2018 was normal at 1.0.   Lithium level [facility] October 03, 2018 = 1.2. Said he got lithium level at [facility] as requested.  Labs not in Epic.  Recent lipids ok except higher TG than usual.  Normal A1C.  No results found for: PHENYTOIN, PHENOBARB, VALPROATE, CBMZ   .res Assessment: Plan:    Albert [name] was seen today for follow-up, depression, anxiety and sleeping problem.  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  He usually has mixed bipolar symptoms which we have not been able to completely eliminate.  See long list of meds tried.   He is somewhat chronically unstable but better on olanzapine worse if reduces quetiapine quickly.  Discussed mother's concerns about his memory which is primarily a short-term memory issue.  We discussed the alternative options of using Namenda off label for mild cognitive impairment.  We are not yet able to reduce the dose of lorazepam but that could potentially help as well.  His memory was much worse on Xanax than lorazepam. We discussed the short-term risks associated with benzodiazepines including sedation and increased fall risk among others.  Discussed long-term side effect risk including dependence, potential withdrawal symptoms, and the potential eventual dose-related risk of dementia. Disc newer studies refute dementia risks.  Unfortunately due to the severity of set his symptoms polypharmacy is a necessity.  Disc unusual combo antipsychotics but it helps more than other options.  Discussed potential metabolic side effects associated with atypical antipsychotics, as well as potential risk for movement side effects. Advised pt to contact office if movement side effects occur.  He had some falling  around the time when he previously took olanzapine but he took it for several months at this dosage before he had any side effect issues and he was on Xanax at the time that the following occurred. Take LED Seroquel to sleep.   No obvious alternatives for sleep  Lithium being used bc chronic SI and death thoughts.   Counseled patient regarding potential benefits, risks, and side effects of lithium to include potential risk of lithium affecting thyroid and renal function.  Discussed need for periodic lab monitoring to determine drug level and to assess for potential adverse effects.  Counseled patient regarding signs and symptoms of lithium toxicity and advised that they notify office immediately or seek urgent medical attention if experiencing these signs and symptoms.  Patient advised to contact office with any questions or concerns. Continue lithium 2 of the 300 mg capsules. Lithium level 0.8.  On 750 mg daily.   Call if death thoughts worsen or worsening SI.  Continue lorazepam 1 mg in the morning, 1 mg in the afternoon and 1 mg at night for anxiety  Previously he had difficulty tolerating 15 mg of olanzapine due to akathisia but he is not having akathisia at this time on 20 mg daily.  Disc SE. Disc combo with Seroquel may  lower response rate but he doesn't think he can sleep without Seroquel. Will try to go as low as possible if not off of it eventually.  Will go down again.    Continue olanzapine 20 mg nightly longer to give it time to help mood sx DT severity of sx and difficulty getting off Seroquel conside rincrease.  Disc WD  and may need to go lower before he can stop it. Because he had WD reducing from 300 to 150 mg daily will have to go slowly. Reduce quetiapine by 50 mg every 2-4 weeks.  We discussed the short-term risks associated with benzodiazepines including sedation and increased fall risk among others.  Discussed long-term side effect risk including dependence, potential  withdrawal symptoms, and the potential eventual dose-related risk of dementia.  But recent studies from 2020 dispute this association between benzodiazepines and dementia risk. Newer studies in 2020 do not support an association with dementia.  Discussed safety plan at length with patient.  Advised patient to contact office with any worsening signs and symptoms.  Instructed patient to go to the [facility] emergency room for evaluation if experiencing any acute safety concerns, to include suicidal intent.  Disc Ozempic for weight loss.  Has had weight gain from meds as a contributor. But lately has lost some weight.  Needs to keep up activity as much as possible for depression.  This appt was 30 mins.  FU 4 weeks   Meredith Staggers, MD, DFAPA  Future Appointments  Date Time Provider Department Center  02/23/2021  2:00 PM [place], [place], MD [facility] None  03/17/2021  1:00 PM Cottle, Steva Ready., MD CP-CP None  12/30/2021  2:45 PM Sondra Come, MD [facility] None    No orders of the defined types were placed in this encounter.     -------------------------------

## 2021-02-23 ENCOUNTER — Other Ambulatory Visit: Payer: Self-pay

## 2021-02-23 ENCOUNTER — Ambulatory Visit: Payer: Medicare PPO | Admitting: Surgery

## 2021-02-23 ENCOUNTER — Encounter: Payer: Self-pay | Admitting: Surgery

## 2021-02-23 VITALS — BP 111/75 | HR 85 | Temp 98.0°F | Ht 69.0 in | Wt 196.0 lb

## 2021-02-23 DIAGNOSIS — K4020 Bilateral inguinal hernia, without obstruction or gangrene, not specified as recurrent: Secondary | ICD-10-CM | POA: Diagnosis not present

## 2021-02-23 NOTE — Patient Instructions (Signed)
Our surgery scheduler will call you within 24-48 hours to schedule your surgery. Please have the Blue surgery sheet available when speaking with her.    Inguinal Hernia, Adult An inguinal hernia is when fat or your intestines push through a weak spot in a muscle where your leg meets your lower belly (groin). This causes a bulge. This kind of hernia could also be: In your scrotum, if you are male. In folds of skin around your vagina, if you are male. There are three types of inguinal hernias: Hernias that can be pushed back into the belly (are reducible). This type rarely causes pain. Hernias that cannot be pushed back into the belly (are incarcerated). Hernias that cannot be pushed back into the belly and lose their blood supply (are strangulated). This type needs emergency surgery. What are the causes? This condition is caused by having a weak spot in the muscles or tissues in your groin. This develops over time. The hernia may poke through the weak spot when you strain your lower belly muscles all of a sudden, such as when you: Lift a heavy object. Strain to poop (have a bowel movement). Trouble pooping (constipation) can lead to straining. Cough. What increases the risk? This condition is more likely to develop in: Males. Pregnant females. People who: Are overweight. Work in jobs that require long periods of standing or heavy lifting. Have had an inguinal hernia before. Smoke or have lung disease. These factors can lead to long-term (chronic) coughing. What are the signs or symptoms? Symptoms may depend on the size of the hernia. Often, a small hernia has no symptoms. Symptoms of a larger hernia may include: A bulge in the groin area. This is easier to see when standing. You might not be able to see it when you are lying down. Pain or burning in the groin. This may get worse when you lift, strain, or cough. A dull ache or a feeling of pressure in the groin. An abnormal bulge in the  scrotum, in males. Symptoms of a strangulated inguinal hernia may include: A bulge in your groin that is very painful and tender to the touch. A bulge that turns red or purple. Fever, feeling like you may vomit (nausea), and vomiting. Not being able to poop or to pass gas. How is this treated? Treatment depends on the size of your hernia and whether you have symptoms. If you do not have symptoms, your doctor may have you watch your hernia carefully and have you come in for follow-up visits. If your hernia is large or if youhave symptoms, you may need surgery to repair the hernia. Follow these instructions at home: Lifestyle Avoid lifting heavy objects. Avoid standing for long amounts of time. Do not smoke or use any products that contain nicotine or tobacco. If you need help quitting, ask your doctor. Stay at a healthy weight. Prevent trouble pooping You may need to take these actions to prevent or treat trouble pooping: Drink enough fluid to keep your pee (urine) pale yellow. Take over-the-counter or prescription medicines. Eat foods that are high in fiber. These include beans, whole grains, and fresh fruits and vegetables. Limit foods that are high in fat and sugar. These include fried or sweet foods. General instructions You may try to push your hernia back in place by very gently pressing on it when you are lying down. Do not try to push the bulge back in if it will not go in easily. Watch your hernia for any   changes in shape, size, or color. Tell your doctor if you see any changes. Take over-the-counter and prescription medicines only as told by your doctor. Keep all follow-up visits. Contact a doctor if: You have a fever or chills. You have new symptoms. Your symptoms get worse. Get help right away if: You have pain in your groin that gets worse all of a sudden. You have a bulge in your groin that: Gets bigger all of a sudden, and it does not get smaller after that. Turns red  or purple. Is painful when you touch it. You are a male, and you have: Sudden pain in your scrotum. A sudden change in the size of your scrotum. You cannot push the hernia back in place by very gently pressing on it when you are lying down. You feel like you may vomit, and that feeling does not go away. You keep vomiting. You have a fast heartbeat. You cannot poop or pass gas. These symptoms may be an emergency. Get help right away. Call your local emergency services (911 in the [place] Do not wait to see if the symptoms will go away. Do not drive yourself to the hospital. Summary An inguinal hernia is when fat or your intestines push through a weak spot in a muscle where your leg meets your lower belly (groin). This causes a bulge. If you do not have symptoms, you may not need treatment. If you have symptoms or a large hernia, you may need surgery. Avoid lifting heavy objects. Also, avoid standing for long amounts of time. Do not try to push the bulge back in if it will not go in easily. This information is not intended to replace advice given to you by your health care provider. Make sure you discuss any questions you have with your healthcare provider. Document Revised: 12/18/2019 Document Reviewed: 12/18/2019 Elsevier Patient Education  2022 [employer] Inc[employer]  

## 2021-02-24 ENCOUNTER — Telehealth: Payer: Self-pay | Admitting: Surgery

## 2021-02-24 NOTE — Progress Notes (Signed)
Outpatient Surgical Follow Up  02/24/2021  Albert [name] is an 55 y.o. male.   Chief Complaint  Patient presents with   Follow-up    HPI: Albert [name] is a 55 year old male well-known to me with a prior history of incisional hernia paraesophageal hernia repair that I did 4 months ago he is doing very well after that and reports no issues.  He now comes in for well-known symptomatic bilateral inguinal hernias.  He reports some discomfort and intermittent pain worsening with Valsalva.  No fevers no chills no evidence of incarceration or strangulation.  I once again personally reviewed the CT scan showing evidence of bilateral inguinal hernias.  No other issues regarding hernias.  He is back to his baseline.  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    Hypothyroidism    Pneumonia    PONV (postoperative nausea and vomiting)     Past Surgical History:  Procedure Laterality Date   BACK SURGERY     lumbar disc   colonoscopyx2 N/A    2003, 2016   ESOPHAGOGASTRODUODENOSCOPY     1998, 2002, 2003, 2016   ESOPHAGOGASTRODUODENOSCOPY (EGD) WITH PROPOFOL N/A 09/12/2020   Procedure: ESOPHAGOGASTRODUODENOSCOPY (EGD) WITH PROPOFOL;  Surgeon: Earline Mayotte, MD;  Location: [facility] END[facility];  Service: Endoscopy;  Laterality: N/A;  1ST CASE   EYE SURGERY  2006   strabismus surgery   HERNIA REPAIR     Hiatal Hernia   NASAL SEPTUM SURGERY     NM I- 131 THERAPY FOR ABLATION ([facility] HX)  04/11/2015   thyroid   SPINE SURGERY     XI ROBOTIC ASSISTED PARAESOPHAGEAL HERNIA REPAIR N/A 10/23/2020   Procedure: XI  ROBOTIC ASSISTED PARAESOPHAGEAL HERNIA REPAIR WITH MESH AND INCISIONAL HERNIA REPAIR WITH MESH;  Surgeon: Leafy Ro, MD;  Location: [facility] ORS;  Service: General;  Laterality: N/A;    Family History  Problem Relation Age of Onset   Prostate cancer Neg Hx    Bladder Cancer Neg Hx    Kidney cancer Neg Hx     Social History:  reports that he has quit smoking. His smoking use included cigarettes. He has quit using smokeless tobacco.  His smokeless tobacco use included chew. He reports that he does not currently use alcohol. He reports that he does not use drugs.  Allergies:  Allergies  Allergen Reactions   Meloxicam Other (See Comments)    Other reaction(s): Dizziness   Ibuprofen     Can not take because taking lithium   Prednisone     Can't sleep    Wellbutrin [Bupropion]     Suicide thoughts    Medications reviewed.    ROS Full ROS performed and is otherwise negative other than what is stated in HPI   BP 111/75   Pulse 85   Temp 98 F (36.7 C)   Ht 5\' 9"  (1.753 m)   Wt 196 lb (88.9 kg)   SpO2 97%   BMI 28.94 kg/m   Physical Exam Vitals  and nursing note reviewed. Exam conducted with a chaperone present.  Constitutional:      General: He is not in acute distress.    Appearance: Normal appearance. He is not ill-appearing.  Cardiovascular:     Rate and Rhythm: Normal rate and regular rhythm.     Heart sounds: Normal heart sounds. No murmur heard. Pulmonary:     Effort: Pulmonary effort is normal. No respiratory distress.     Breath sounds: Normal breath sounds. No stridor.  Abdominal:     General: Abdomen is flat. There is no distension.     Palpations: Abdomen is soft. There is no mass.     Tenderness: There is no abdominal tenderness. There is no guarding.     Hernia: A hernia is present.     Comments: Preior robotic scars healing well, no recurrent incisional hernia. Bilateral reducible IH,  Musculoskeletal:        General: No swelling or tenderness.  Normal range of motion.     Cervical back: Normal range of motion. No tenderness.  Skin:    Capillary Refill: Capillary refill takes less than 2 seconds.  Neurological:     General: No focal deficit present.     Mental Status: He is alert and oriented to person, place, and time.  Psychiatric:        Mood and Affect: Mood normal.        Behavior: Behavior normal.        Thought Content: Thought content normal.        Judgment: Judgment normal.     Assessment/Plan:  1. Non-recurrent bilateral inguinal hernia without obstruction or gangrene 55 year old male with symptomatic bilateral inguinal hernias.  I do recommend repair and I do think that he will be a good candidate for robotic approach.  Procedure discussed with the patient in detail.  Risks, benefits and possible complications including but not limited to: Bleeding, infection, bowel injuries, recurrence, chronic pain.  He understands and wishes to proceed.  I spent > 40 minutes in this encounter including coordination counseling of patient care, placing orders and personally reviewing records.  Sterling Big, MD [facility] General Surgeon

## 2021-02-24 NOTE — H&P (View-Only) (Signed)
Outpatient Surgical Follow Up  02/24/2021  Albert [name] is an 55 y.o. male.   Chief Complaint  Patient presents with   Follow-up    HPI: Albert [name] is a 55 year old male well-known to me with a prior history of incisional hernia paraesophageal hernia repair that I did 4 months ago he is doing very well after that and reports no issues.  He now comes in for well-known symptomatic bilateral inguinal hernias.  He reports some discomfort and intermittent pain worsening with Valsalva.  No fevers no chills no evidence of incarceration or strangulation.  I once again personally reviewed the CT scan showing evidence of bilateral inguinal hernias.  No other issues regarding hernias.  He is back to his baseline.  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    Hypothyroidism    Pneumonia    PONV (postoperative nausea and vomiting)     Past Surgical History:  Procedure Laterality Date   BACK SURGERY     lumbar disc   colonoscopyx2 N/A    2003, 2016   ESOPHAGOGASTRODUODENOSCOPY     1998, 2002, 2003, 2016   ESOPHAGOGASTRODUODENOSCOPY (EGD) WITH PROPOFOL N/A 09/12/2020   Procedure: ESOPHAGOGASTRODUODENOSCOPY (EGD) WITH PROPOFOL;  Surgeon: Earline Mayotte, MD;  Location: [facility] END[facility];  Service: Endoscopy;  Laterality: N/A;  1ST CASE   EYE SURGERY  2006   strabismus surgery   HERNIA REPAIR     Hiatal Hernia   NASAL SEPTUM SURGERY     NM I- 131 THERAPY FOR ABLATION ([facility] HX)  04/11/2015   thyroid   SPINE SURGERY     XI ROBOTIC ASSISTED PARAESOPHAGEAL HERNIA REPAIR N/A 10/23/2020   Procedure: XI  ROBOTIC ASSISTED PARAESOPHAGEAL HERNIA REPAIR WITH MESH AND INCISIONAL HERNIA REPAIR WITH MESH;  Surgeon: Leafy Ro, MD;  Location: [facility] ORS;  Service: General;  Laterality: N/A;    Family History  Problem Relation Age of Onset   Prostate cancer Neg Hx    Bladder Cancer Neg Hx    Kidney cancer Neg Hx     Social History:  reports that he has quit smoking. His smoking use included cigarettes. He has quit using smokeless tobacco.  His smokeless tobacco use included chew. He reports that he does not currently use alcohol. He reports that he does not use drugs.  Allergies:  Allergies  Allergen Reactions   Meloxicam Other (See Comments)    Other reaction(s): Dizziness   Ibuprofen     Can not take because taking lithium   Prednisone     Can't sleep    Wellbutrin [Bupropion]     Suicide thoughts    Medications reviewed.    ROS Full ROS performed and is otherwise negative other than what is stated in HPI   BP 111/75   Pulse 85   Temp 98 F (36.7 C)   Ht 5\' 9"  (1.753 m)   Wt 196 lb (88.9 kg)   SpO2 97%   BMI 28.94 kg/m   Physical Exam Vitals  and nursing note reviewed. Exam conducted with a chaperone present.  Constitutional:      General: He is not in acute distress.    Appearance: Normal appearance. He is not ill-appearing.  Cardiovascular:     Rate and Rhythm: Normal rate and regular rhythm.     Heart sounds: Normal heart sounds. No murmur heard. Pulmonary:     Effort: Pulmonary effort is normal. No respiratory distress.     Breath sounds: Normal breath sounds. No stridor.  Abdominal:     General: Abdomen is flat. There is no distension.     Palpations: Abdomen is soft. There is no mass.     Tenderness: There is no abdominal tenderness. There is no guarding.     Hernia: A hernia is present.     Comments: Preior robotic scars healing well, no recurrent incisional hernia. Bilateral reducible IH,  Musculoskeletal:        General: No swelling or tenderness.  Normal range of motion.     Cervical back: Normal range of motion. No tenderness.  Skin:    Capillary Refill: Capillary refill takes less than 2 seconds.  Neurological:     General: No focal deficit present.     Mental Status: He is alert and oriented to person, place, and time.  Psychiatric:        Mood and Affect: Mood normal.        Behavior: Behavior normal.        Thought Content: Thought content normal.        Judgment: Judgment normal.     Assessment/Plan:  1. Non-recurrent bilateral inguinal hernia without obstruction or gangrene 55 year old male with symptomatic bilateral inguinal hernias.  I do recommend repair and I do think that he will be a good candidate for robotic approach.  Procedure discussed with the patient in detail.  Risks, benefits and possible complications including but not limited to: Bleeding, infection, bowel injuries, recurrence, chronic pain.  He understands and wishes to proceed.  I spent > 40 minutes in this encounter including coordination counseling of patient care, placing orders and personally reviewing records.  Sterling Big, MD [facility] General Surgeon

## 2021-02-24 NOTE — Telephone Encounter (Signed)
Patient has been advised of Pre-Admission date/time, COVID Testing date and Surgery date.  Surgery Date: 03/03/21 Preadmission Testing Date: 02/26/21 (phone 8a-1p) Covid Testing Date: Not needed.     Patient has been made aware to call [phone], between 1-3:00pm the day before surgery, to find out what time to arrive for surgery.

## 2021-02-25 ENCOUNTER — Encounter: Payer: Self-pay | Admitting: Surgery

## 2021-02-25 ENCOUNTER — Other Ambulatory Visit: Payer: Self-pay | Admitting: Psychiatry

## 2021-02-25 DIAGNOSIS — F314 Bipolar disorder, current episode depressed, severe, without psychotic features: Secondary | ICD-10-CM

## 2021-02-26 ENCOUNTER — Other Ambulatory Visit: Payer: Self-pay

## 2021-02-26 ENCOUNTER — Encounter
Admission: RE | Admit: 2021-02-26 | Discharge: 2021-02-26 | Disposition: A | Discharge status: Home / Self Care | Payer: Medicare PPO | Source: Ambulatory Visit | Attending: Surgery | Admitting: Surgery

## 2021-02-26 ENCOUNTER — Other Ambulatory Visit
Admission: RE | Admit: 2021-02-26 | Discharge: 2021-02-26 | Disposition: A | Discharge status: Home / Self Care | Payer: Medicare PPO | Source: Ambulatory Visit | Attending: Surgery | Admitting: Surgery

## 2021-02-26 VITALS — Ht 69.0 in | Wt 191.0 lb

## 2021-02-26 DIAGNOSIS — Z01818 Encounter for other preprocedural examination: Secondary | ICD-10-CM | POA: Insufficient documentation

## 2021-02-26 DIAGNOSIS — Z01810 Encounter for preprocedural cardiovascular examination: Secondary | ICD-10-CM | POA: Diagnosis not present

## 2021-02-26 DIAGNOSIS — I10 Essential (primary) hypertension: Secondary | ICD-10-CM | POA: Insufficient documentation

## 2021-02-26 HISTORY — DX: Attention-deficit hyperactivity disorder, unspecified type: F90.9

## 2021-02-26 LAB — BASIC METABOLIC PANEL
Anion gap: 8 (ref 5–15)
BUN: 13 mg/dL (ref 6–20)
CO2: 22 mmol/L (ref 22–32)
Calcium: 9.6 mg/dL (ref 8.9–10.3)
Chloride: 109 mmol/L (ref 98–111)
Creatinine, Ser: 1.56 mg/dL — ABNORMAL HIGH (ref 0.61–1.24)
GFR, Estimated: 51 mL/min — ABNORMAL LOW (ref >60)
Glucose, Bld: 83 mg/dL (ref 70–99)
Potassium: 4 mmol/L (ref 3.5–5.1)
Sodium: 139 mmol/L (ref 135–145)

## 2021-02-26 LAB — CBC
HCT: 39.4 % (ref 39.0–52.0)
Hemoglobin: 13.3 g/dL (ref 13.0–17.0)
MCH: 31.9 pg (ref 26.0–34.0)
MCHC: 33.8 g/dL (ref 30.0–36.0)
MCV: 94.5 fL (ref 80.0–100.0)
Platelets: 431 10*3/uL — ABNORMAL HIGH (ref 150–400)
RBC: 4.17 MIL/uL — ABNORMAL LOW (ref 4.22–5.81)
RDW: 13.1 % (ref 11.5–15.5)
WBC: 6.2 10*3/uL (ref 4.0–10.5)
nRBC: 0 % (ref 0.0–0.2)

## 2021-02-26 NOTE — Patient Instructions (Addendum)
Your procedure is scheduled on: Tuesday 03/03/21 Report to the Registration Desk on the 1st floor of the [facility]. To find out your arrival time, please call [phone] between 1PM - 3PM on: Monday 03/02/21  REMEMBER: Instructions that are not followed completely may result in serious medical risk, up to and including death; or upon the discretion of your surgeon and anesthesiologist your surgery may need to be rescheduled.  Do not eat food after midnight the night before surgery.  No gum chewing, lozengers or hard candies.  You may however, drink CLEAR liquids up to 2 hours before you are scheduled to arrive for your surgery. Do not drink anything within 2 hours of your scheduled arrival time.  Clear liquids include: - water  - apple juice without pulp - gatorade (not RED, PURPLE, OR BLUE) - black coffee or tea (Do NOT add milk or creamers to the coffee or tea) Do NOT drink anything that is not on this list.  TAKE THESE MEDICATIONS THE MORNING OF SURGERY WITH A SIP OF WATER: fenofibrate (TRICOR) 145 MG tablet FLUoxetine (PROZAC) 40 MG capsule levothyroxine (SYNTHROID, LEVOTHROID) 75 MCG tablet LORazepam (ATIVAN) 1 MG tablet  One week prior to surgery: Stop Anti-inflammatories (NSAIDS) such as Advil, Aleve, Ibuprofen, Motrin, Naproxen, Naprosyn and Aspirin based products such as Excedrin, Goodys Powder, BC Powder. Stop polycarbophil (FIBERCON) 625 MG tablet and ANY OVER THE COUNTER supplements until after surgery. You may however, continue to take Tylenol if needed for pain up until the day of surgery.  No Alcohol for 24 hours before or after surgery.  No Smoking including e-cigarettes for 24 hours prior to surgery.  No chewable tobacco products for at least 6 hours prior to surgery.  No nicotine patches on the day of surgery.  Do not use any "recreational" drugs for at least a week prior to your surgery.  Please be advised that the combination of cocaine and anesthesia  may have negative outcomes, up to and including death. If you test positive for cocaine, your surgery will be cancelled.  On the morning of surgery brush your teeth with toothpaste and water, you may rinse your mouth with mouthwash if you wish. Do not swallow any toothpaste or mouthwash.  Use CHG wipes as directed on instruction sheet.  Do not wear jewelry.  Do not wear lotions, powders, or cologne.   Do not shave body from the neck down 48 hours prior to surgery just in case you cut yourself which could leave a site for infection.  Also, freshly shaved skin may become irritated if using the CHG soap.  Dentures may not be worn into surgery.  Do not bring valuables to the hospital. [facility] is not responsible for any missing/lost belongings or valuables.   Notify your doctor if there is any change in your medical condition (cold, fever, infection).  Wear comfortable clothing (specific to your surgery type) to the hospital.  After surgery, you can help prevent lung complications by doing breathing exercises.  Take deep breaths and cough every 1-2 hours. Your doctor may order a device called an Incentive Spirometer to help you take deep breaths. When coughing or sneezing, hold a pillow firmly against your incision with both hands. This is called "splinting." Doing this helps protect your incision. It also decreases belly discomfort.  If you are being discharged the day of surgery, you will not be allowed to drive home. You will need a responsible adult (18 years or older) to drive you  home and stay with you that night.   If you are taking public transportation, you will need to have a responsible adult (18 years or older) with you. Please confirm with your physician that it is acceptable to use public transportation.   Please call the Pre-admissions Testing Dept. at [phone] if you have any questions about these instructions.  Surgery Visitation Policy:  Patients undergoing  a surgery or procedure may have one family member or support person with them as long as that person is not COVID-19 positive or experiencing its symptoms.  That person may remain in the waiting area during the procedure and may rotate out with other people.  Inpatient Visitation:    Visiting hours are 7 a.m. to 8 p.m. Up to two visitors ages 16+ are allowed at one time in a patient room. The visitors may rotate out with other people during the day. Visitors must check out when they leave, or other visitors will not be allowed. One designated support person may remain overnight. The visitor must pass COVID-19 screenings, use hand sanitizer when entering and exiting the patient's room and wear a mask at all times, including in the patient's room. Patients must also wear a mask when staff or their visitor are in the room. Masking is required regardless of vaccination status.

## 2021-02-27 ENCOUNTER — Other Ambulatory Visit: Payer: Self-pay | Admitting: Psychiatry

## 2021-02-27 DIAGNOSIS — F411 Generalized anxiety disorder: Secondary | ICD-10-CM

## 2021-02-27 DIAGNOSIS — F314 Bipolar disorder, current episode depressed, severe, without psychotic features: Secondary | ICD-10-CM

## 2021-02-27 DIAGNOSIS — F4001 Agoraphobia with panic disorder: Secondary | ICD-10-CM

## 2021-03-03 ENCOUNTER — Encounter: Payer: Self-pay | Admitting: Surgery

## 2021-03-03 ENCOUNTER — Ambulatory Visit: Payer: Medicare PPO | Admitting: Urgent Care

## 2021-03-03 ENCOUNTER — Ambulatory Visit: Payer: Medicare PPO | Admitting: Anesthesiology

## 2021-03-03 ENCOUNTER — Other Ambulatory Visit: Payer: Self-pay

## 2021-03-03 ENCOUNTER — Encounter
Admission: RE | Disposition: A | Discharge status: Home / Self Care | Payer: Self-pay | Source: Home / Self Care | Attending: Surgery

## 2021-03-03 ENCOUNTER — Ambulatory Visit
Admission: RE | Admit: 2021-03-03 | Discharge: 2021-03-03 | Disposition: A | Discharge status: Home / Self Care | Payer: Medicare PPO | Attending: Surgery | Admitting: Surgery

## 2021-03-03 DIAGNOSIS — Z87891 Personal history of nicotine dependence: Secondary | ICD-10-CM | POA: Insufficient documentation

## 2021-03-03 DIAGNOSIS — K4020 Bilateral inguinal hernia, without obstruction or gangrene, not specified as recurrent: Secondary | ICD-10-CM | POA: Diagnosis not present

## 2021-03-03 HISTORY — PX: INSERTION OF MESH: SHX5868

## 2021-03-03 SURGERY — REPAIR, HERNIA, INGUINAL, BILATERAL, ROBOT-ASSISTED
Anesthesia: General | Laterality: Bilateral

## 2021-03-03 MED ORDER — ROCURONIUM BROMIDE 100 MG/10ML IV SOLN
INTRAVENOUS | Status: DC | PRN
  Administered 2021-03-03 (×2): 50 mg via INTRAVENOUS
  Administered 2021-03-03: 20 mg via INTRAVENOUS

## 2021-03-03 MED ORDER — MEPERIDINE HCL 25 MG/ML IJ SOLN: 6.2500 mg | INTRAMUSCULAR | Status: DC | PRN

## 2021-03-03 MED ORDER — EPHEDRINE 5 MG/ML INJ
INTRAVENOUS | Status: AC
  Filled 2021-03-03: qty 5

## 2021-03-03 MED ORDER — GLYCOPYRROLATE 0.2 MG/ML IJ SOLN
INTRAMUSCULAR | Status: DC | PRN
  Administered 2021-03-03: .2 mg via INTRAVENOUS

## 2021-03-03 MED ORDER — ROCURONIUM BROMIDE 10 MG/ML (PF) SYRINGE
PREFILLED_SYRINGE | INTRAVENOUS | Status: AC
  Filled 2021-03-03: qty 10

## 2021-03-03 MED ORDER — ACETAMINOPHEN 500 MG PO TABS: 1000.0000 mg | ORAL_TABLET | ORAL | Status: AC

## 2021-03-03 MED ORDER — APREPITANT 40 MG PO CAPS
ORAL_CAPSULE | ORAL | Status: AC
  Administered 2021-03-03: 40 mg via ORAL
  Filled 2021-03-03: qty 1

## 2021-03-03 MED ORDER — GABAPENTIN 300 MG PO CAPS
ORAL_CAPSULE | ORAL | Status: AC
  Administered 2021-03-03: 300 mg via ORAL
  Filled 2021-03-03: qty 1

## 2021-03-03 MED ORDER — BUPIVACAINE-EPINEPHRINE 0.25% -1:200000 IJ SOLN
INTRAMUSCULAR | Status: DC | PRN
  Administered 2021-03-03: 50 mL

## 2021-03-03 MED ORDER — ONDANSETRON HCL 4 MG/2ML IJ SOLN
INTRAMUSCULAR | Status: AC
  Filled 2021-03-03: qty 2

## 2021-03-03 MED ORDER — 0.9 % SODIUM CHLORIDE (POUR BTL) OPTIME
TOPICAL | Status: DC | PRN
  Administered 2021-03-03: 100 mL

## 2021-03-03 MED ORDER — CHLORHEXIDINE GLUCONATE 0.12 % MT SOLN
OROMUCOSAL | Status: AC
  Administered 2021-03-03: 15 mL via OROMUCOSAL
  Filled 2021-03-03: qty 15

## 2021-03-03 MED ORDER — ONDANSETRON HCL 4 MG/2ML IJ SOLN
INTRAMUSCULAR | Status: DC | PRN
  Administered 2021-03-03: 4 mg via INTRAVENOUS

## 2021-03-03 MED ORDER — PROMETHAZINE HCL 25 MG/ML IJ SOLN: 6.2500 mg | INTRAMUSCULAR | Status: DC | PRN

## 2021-03-03 MED ORDER — FENTANYL CITRATE (PF) 100 MCG/2ML IJ SOLN
INTRAMUSCULAR | Status: AC
  Filled 2021-03-03: qty 2

## 2021-03-03 MED ORDER — FENTANYL CITRATE (PF) 100 MCG/2ML IJ SOLN
25.0000 ug | INTRAMUSCULAR | Status: DC | PRN
  Administered 2021-03-03: 25 ug via INTRAVENOUS

## 2021-03-03 MED ORDER — MIDAZOLAM HCL 2 MG/2ML IJ SOLN
INTRAMUSCULAR | Status: AC
  Filled 2021-03-03: qty 2

## 2021-03-03 MED ORDER — DEXMEDETOMIDINE (PRECEDEX) IN NS 20 MCG/5ML (4 MCG/ML) IV SYRINGE
PREFILLED_SYRINGE | INTRAVENOUS | Status: DC | PRN
  Administered 2021-03-03: 8 ug via INTRAVENOUS

## 2021-03-03 MED ORDER — APREPITANT 40 MG PO CAPS: 40.0000 mg | ORAL_CAPSULE | Freq: Once | ORAL | Status: AC

## 2021-03-03 MED ORDER — ORAL CARE MOUTH RINSE: 15.0000 mL | Freq: Once | OROMUCOSAL | Status: AC

## 2021-03-03 MED ORDER — CHLORHEXIDINE GLUCONATE CLOTH 2 % EX PADS: 6.0000 | MEDICATED_PAD | Freq: Once | CUTANEOUS | Status: DC

## 2021-03-03 MED ORDER — ACETAMINOPHEN 500 MG PO TABS
ORAL_TABLET | ORAL | Status: AC
  Administered 2021-03-03: 1000 mg via ORAL
  Filled 2021-03-03: qty 2

## 2021-03-03 MED ORDER — GLYCOPYRROLATE 0.2 MG/ML IJ SOLN
INTRAMUSCULAR | Status: AC
  Filled 2021-03-03: qty 1

## 2021-03-03 MED ORDER — FENTANYL CITRATE (PF) 100 MCG/2ML IJ SOLN
INTRAMUSCULAR | Status: DC | PRN
  Administered 2021-03-03 (×2): 50 ug via INTRAVENOUS

## 2021-03-03 MED ORDER — OXYCODONE HCL 5 MG/5ML PO SOLN
5.0000 mg | Freq: Once | ORAL | Status: AC | PRN
Start: 2021-03-03 — End: 2021-03-03

## 2021-03-03 MED ORDER — FAMOTIDINE 20 MG PO TABS: 20.0000 mg | ORAL_TABLET | Freq: Once | ORAL | Status: AC

## 2021-03-03 MED ORDER — BUPIVACAINE LIPOSOME 1.3 % IJ SUSP
INTRAMUSCULAR | Status: AC
  Filled 2021-03-03: qty 20

## 2021-03-03 MED ORDER — CEFAZOLIN SODIUM-DEXTROSE 2-4 GM/100ML-% IV SOLN
2.0000 g | INTRAVENOUS | Status: AC
  Administered 2021-03-03: 2 g via INTRAVENOUS

## 2021-03-03 MED ORDER — LACTATED RINGERS IV SOLN: INTRAVENOUS | Status: DC

## 2021-03-03 MED ORDER — HYDROCODONE-ACETAMINOPHEN 5-325 MG PO TABS: 1.0000 | ORAL_TABLET | ORAL | 0 refills | Status: DC | PRN

## 2021-03-03 MED ORDER — BUPIVACAINE-EPINEPHRINE (PF) 0.25% -1:200000 IJ SOLN
INTRAMUSCULAR | Status: AC
  Filled 2021-03-03: qty 30

## 2021-03-03 MED ORDER — LIDOCAINE HCL (CARDIAC) PF 100 MG/5ML IV SOSY
PREFILLED_SYRINGE | INTRAVENOUS | Status: DC | PRN
  Administered 2021-03-03: 100 mg via INTRATRACHEAL

## 2021-03-03 MED ORDER — LIDOCAINE HCL (PF) 2 % IJ SOLN
INTRAMUSCULAR | Status: AC
  Filled 2021-03-03: qty 5

## 2021-03-03 MED ORDER — CHLORHEXIDINE GLUCONATE 0.12 % MT SOLN: 15.0000 mL | Freq: Once | OROMUCOSAL | Status: AC

## 2021-03-03 MED ORDER — GABAPENTIN 300 MG PO CAPS
300.0000 mg | ORAL_CAPSULE | ORAL | Status: AC
Start: 2021-03-03 — End: 2021-03-03

## 2021-03-03 MED ORDER — SUGAMMADEX SODIUM 200 MG/2ML IV SOLN
INTRAVENOUS | Status: DC | PRN
Start: 2021-03-03 — End: 2021-03-03
  Administered 2021-03-03: 200 mg via INTRAVENOUS

## 2021-03-03 MED ORDER — EPHEDRINE SULFATE 50 MG/ML IJ SOLN
INTRAMUSCULAR | Status: DC | PRN
  Administered 2021-03-03: 10 mg via INTRAVENOUS

## 2021-03-03 MED ORDER — CEFAZOLIN SODIUM-DEXTROSE 2-4 GM/100ML-% IV SOLN
INTRAVENOUS | Status: AC
  Filled 2021-03-03: qty 100

## 2021-03-03 MED ORDER — OXYCODONE HCL 5 MG PO TABS
ORAL_TABLET | ORAL | Status: AC
  Filled 2021-03-03: qty 1

## 2021-03-03 MED ORDER — MIDAZOLAM HCL 2 MG/2ML IJ SOLN
INTRAMUSCULAR | Status: DC | PRN
Start: 2021-03-03 — End: 2021-03-03
  Administered 2021-03-03: 2 mg via INTRAVENOUS

## 2021-03-03 MED ORDER — DEXAMETHASONE SODIUM PHOSPHATE 10 MG/ML IJ SOLN
INTRAMUSCULAR | Status: AC
  Filled 2021-03-03: qty 1

## 2021-03-03 MED ORDER — OXYCODONE HCL 5 MG PO TABS
5.0000 mg | ORAL_TABLET | Freq: Once | ORAL | Status: AC | PRN
  Administered 2021-03-03: 5 mg via ORAL

## 2021-03-03 MED ORDER — FAMOTIDINE 20 MG PO TABS
ORAL_TABLET | ORAL | Status: AC
  Administered 2021-03-03: 20 mg via ORAL
  Filled 2021-03-03: qty 1

## 2021-03-03 MED ORDER — DEXAMETHASONE SODIUM PHOSPHATE 10 MG/ML IJ SOLN
INTRAMUSCULAR | Status: DC | PRN
  Administered 2021-03-03: 10 mg via INTRAVENOUS

## 2021-03-03 SURGICAL SUPPLY — 51 items
ADH SKN CLS APL DERMABOND .7 (GAUZE/BANDAGES/DRESSINGS) ×2
APL PRP STRL LF DISP 70% ISPRP (MISCELLANEOUS) ×1
CANNULA REDUC XI 12-8 STAPL (CANNULA) ×2
CANNULA REDUCER 12-8 DVNC XI (CANNULA) ×1 IMPLANT
CHLORAPREP W/TINT 26 (MISCELLANEOUS) ×2 IMPLANT
COVER TIP SHEARS 8 DVNC (MISCELLANEOUS) ×1 IMPLANT
COVER TIP SHEARS 8MM DA VINCI (MISCELLANEOUS) ×2
COVER WAND RF STERILE (DRAPES) ×2 IMPLANT
DEFOGGER SCOPE WARMER CLEARIFY (MISCELLANEOUS) ×2 IMPLANT
DERMABOND ADVANCED (GAUZE/BANDAGES/DRESSINGS) ×2
DERMABOND ADVANCED .7 DNX12 (GAUZE/BANDAGES/DRESSINGS) ×1 IMPLANT
DRAPE ARM DVNC X/XI (DISPOSABLE) ×3 IMPLANT
DRAPE COLUMN DVNC XI (DISPOSABLE) ×1 IMPLANT
DRAPE DA VINCI XI ARM (DISPOSABLE) ×6
DRAPE DA VINCI XI COLUMN (DISPOSABLE) ×2
ELECT CAUTERY BLADE 6.4 (BLADE) ×2 IMPLANT
ELECT REM PT RETURN 9FT ADLT (ELECTROSURGICAL) ×2
ELECTRODE REM PT RTRN 9FT ADLT (ELECTROSURGICAL) ×1 IMPLANT
GLOVE SURG ENC MOIS LTX SZ7 (GLOVE) ×8 IMPLANT
GOWN STRL REUS W/ TWL LRG LVL3 (GOWN DISPOSABLE) ×4 IMPLANT
GOWN STRL REUS W/TWL LRG LVL3 (GOWN DISPOSABLE) ×8
KIT PINK PAD W/HEAD ARE REST (MISCELLANEOUS) ×2
KIT PINK PAD W/HEAD ARM REST (MISCELLANEOUS) ×1 IMPLANT
LABEL OR SOLS (LABEL) ×2 IMPLANT
MANIFOLD NEPTUNE II (INSTRUMENTS) ×2 IMPLANT
MESH 3DMAX 4X6 LT LRG (Mesh General) ×1 IMPLANT
MESH 3DMAX 4X6 RT LRG (Mesh General) ×1 IMPLANT
NEEDLE HYPO 22GX1.5 SAFETY (NEEDLE) ×2 IMPLANT
OBTURATOR OPTICAL STANDARD 8MM (TROCAR) ×2
OBTURATOR OPTICAL STND 8 DVNC (TROCAR) ×1
OBTURATOR OPTICALSTD 8 DVNC (TROCAR) ×1 IMPLANT
PACK LAP CHOLECYSTECTOMY (MISCELLANEOUS) ×2 IMPLANT
PENCIL ELECTRO HAND CTR (MISCELLANEOUS) ×2 IMPLANT
SEAL CANN UNIV 5-8 DVNC XI (MISCELLANEOUS) ×3 IMPLANT
SEAL XI 5MM-8MM UNIVERSAL (MISCELLANEOUS) ×6
SET TUBE SMOKE EVAC HIGH FLOW (TUBING) ×2 IMPLANT
SOLUTION ELECTROLUBE (MISCELLANEOUS) ×2 IMPLANT
SPONGE T-LAP 18X18 ~~LOC~~+RFID (SPONGE) ×2 IMPLANT
STAPLER CANNULA SEAL DVNC XI (STAPLE) ×1 IMPLANT
STAPLER CANNULA SEAL XI (STAPLE) ×2
SUT MNCRL AB 4-0 PS2 18 (SUTURE) ×3 IMPLANT
SUT V-LOC 90 ABS 3-0 VLT  V-20 (SUTURE) ×4
SUT V-LOC 90 ABS 3-0 VLT V-20 (SUTURE) ×2 IMPLANT
SUT VIC AB 2-0 SH 27 (SUTURE) ×4
SUT VIC AB 2-0 SH 27XBRD (SUTURE) ×2 IMPLANT
SUT VICRYL 0 AB UR-6 (SUTURE) ×4 IMPLANT
SYR 20ML LL LF (SYRINGE) ×2 IMPLANT
SYR 30ML LL (SYRINGE) ×2 IMPLANT
TAPE TRANSPORE STRL 2 31045 (GAUZE/BANDAGES/DRESSINGS) ×2 IMPLANT
TROCAR BALLN GELPORT 12X130M (ENDOMECHANICALS) ×2 IMPLANT
WATER STERILE IRR 500ML POUR (IV SOLUTION) ×2 IMPLANT

## 2021-03-03 NOTE — Transfer of Care (Signed)
Immediate Anesthesia Transfer of Care Note  Patient: Albert [name]  Procedure(s) Performed: XI ROBOTIC ASSISTED BILATERAL INGUINAL HERNIA (Bilateral) INSERTION OF MESH (Bilateral)  Patient Location: PACU  Anesthesia Type:General  Level of Consciousness: sedated  Airway & Oxygen Therapy: Patient Spontanous Breathing and Patient connected to face mask oxygen  Post-op Assessment: Report given to RN and Post -op Vital signs reviewed and stable  Post vital signs: Reviewed and stable  Last Vitals:  Vitals Value Taken Time  BP 110/65 03/03/21 1423  Temp    Pulse 73 03/03/21 1425  Resp 16 03/03/21 1425  SpO2 96 % 03/03/21 1425  Vitals shown include unvalidated device data.  Last Pain:  Vitals:   03/03/21 0908  TempSrc: Temporal  PainSc: 4          Complications: No notable events documented.

## 2021-03-03 NOTE — Interval H&P Note (Signed)
History and Physical Interval Note:  03/03/2021 9:22 AM  Albert [name]  has presented today for surgery, with the diagnosis of bilateral inguinal hernias.  The various methods of treatment have been discussed with the patient and family. After consideration of risks, benefits and other options for treatment, the patient has consented to  Procedure(s): XI ROBOTIC ASSISTED BILATERAL INGUINAL HERNIA (Bilateral) as a surgical intervention.  The patient's history has been reviewed, patient examined, no change in status, stable for surgery.  I have reviewed the patient's chart and labs.  Questions were answered to the patient's satisfaction.     Neysha Criado F Jashawna Reever

## 2021-03-03 NOTE — Anesthesia Postprocedure Evaluation (Signed)
Anesthesia Post Note  Patient: Albert [name]  Procedure(s) Performed: XI ROBOTIC ASSISTED BILATERAL INGUINAL HERNIA (Bilateral) INSERTION OF MESH (Bilateral)  Patient location during evaluation: PACU Anesthesia Type: General Level of consciousness: awake and alert and oriented Pain management: pain level controlled Vital Signs Assessment: post-procedure vital signs reviewed and stable Respiratory status: spontaneous breathing, nonlabored ventilation and respiratory function stable Cardiovascular status: blood pressure returned to baseline and stable Postop Assessment: no signs of nausea or vomiting Anesthetic complications: no   No notable events documented.   Last Vitals:  Vitals:   03/03/21 1454 03/03/21 1500  BP:  120/85  Pulse: 73 69  Resp: 14 10  Temp:    SpO2: 91% (!) 87%    Last Pain:  Vitals:   03/03/21 1500  TempSrc:   PainSc: Asleep                 Sunil Hue

## 2021-03-03 NOTE — Op Note (Signed)
Robotic assisted Laparoscopic Transabdominal Bilateral Inguinal Hernias Repair with 3 D large Mesh       Pre-operative Diagnosis:  Bilateral Inguinal Hernia   Post-operative Diagnosis: Same   Procedure: Robotic  Laparoscopic  repair of Bilateral Direct inguinal hernias with Mesh   Surgeon: Sterling Big, MD FACS   Anesthesia: Gen. with endotracheal tube   Findings: Direct Bilateral inguinal hernias        Procedure Details  The patient was seen again in the Holding Room. The benefits, complications, treatment options, and expected outcomes were discussed with the patient. The risks of bleeding, infection, recurrence of symptoms, failure to resolve symptoms, recurrence of hernia, ischemic orchitis, chronic pain syndrome or neuroma, were discussed again. The likelihood of improving the patient's symptoms with return to their baseline status is good.  The patient and/or family concurred with the proposed plan, giving informed consent.  The patient was taken to Operating Room, identified  and the procedure verified as Laparoscopic Inguinal Hernia Repair. Laterality confirmed.  A Time Out was held and the above information confirmed.   Prior to the induction of general anesthesia, antibiotic prophylaxis was administered. VTE prophylaxis was in place. General endotracheal anesthesia was then administered and tolerated well. After the induction, the abdomen was prepped with Chloraprep and draped in the sterile fashion. The patient was positioned in the supine position.     Infraumbilical incision created, avoiding prior mesh,  cut down technique used to enter the abdominal cavity. Fascia elevated and incised and hasson trochar placed. Pneumoperitoneum obtained w/o HD changes. No evidence of bowel injuries.  Two 8 mm placed under direct vision. The laparoscopy revealed bilateral direct defects. I inserted the needles and the mesh. The robot was brought ot the table and docked in the standard fashion,  no collision between arms was observed. Instruments were kept under direct view at all times. We started on the right side were a flap was created. The sac was reduced and dissected free from adjacent structures. We preserved the vas and the vessels. Once dissection was completed a large 3D mesh was placed and secured with two interrupted vicryl attached to the pubic tubercle. There was good coverage of the direct, indirect and femoral spaces. The flap was closed with v lock suture.   Attention then was turned to the left side were a flap was created. The sac was reduced and dissected free from adjacent structures. We preserved the vas and the vessels. Once dissection was completed a large 3D mesh was placed and secured with two interrupted vicryl attached to the pubic tubercle. There was good coverage of the direct, indirect and femoral spaces. The flap was closed with v lock suture. Second look revealed no complications or injuries.     Once assuring that hemostasis was adequate the ports were removed and a figure-of-eight 0 Vicryl suture was placed at the fascial edges. 4-0 subcuticular Monocryl was used at all skin edges. Dermabond was placed.  Patient tolerated the procedure well. There were no complications. He was taken to the recovery room in stable condition.                 Sterling Big, MD, FACS

## 2021-03-03 NOTE — Telephone Encounter (Signed)
Please call patient and ask him how he is doing with the process of weaning quetiapine and what is his current dosage.

## 2021-03-03 NOTE — Anesthesia Procedure Notes (Signed)
Procedure Name: Intubation Date/Time: 03/03/2021 11:43 AM Performed by: Emmie Niemann, MD Pre-anesthesia Checklist: Patient identified, Patient being monitored, Timeout performed, Emergency Drugs available and Suction available Patient Re-evaluated:Patient Re-evaluated prior to induction Oxygen Delivery Method: Circle system utilized Preoxygenation: Pre-oxygenation with 100% oxygen Induction Type: IV induction Ventilation: Mask ventilation without difficulty Laryngoscope Size: Mac and 4 Grade View: Grade I Tube type: Oral Tube size: 7.5 mm Number of attempts: 1 Airway Equipment and Method: Stylet Placement Confirmation: ETT inserted through vocal cords under direct vision, positive ETCO2 and breath sounds checked- equal and bilateral Secured at: 23 cm Tube secured with: Tape Dental Injury: Teeth and Oropharynx as per pre-operative assessment

## 2021-03-03 NOTE — Discharge Instructions (Addendum)
Laparoscopic Inguinal Hernia Repair, Adult, Care After ?The following information offers guidance on how to care for yourself after your procedure. Your health care provider may also give you more specific instructions. If you have problems or questions, contact your health care provider. ?What can I expect after the procedure? ?After the procedure, it is common to have: ?Pain. ?Swelling and bruising around the incision area. ?Scrotal swelling, in males. ?Some fluid or blood draining from your incisions. ?Follow these instructions at home: ?Medicines ?Take over-the-counter and prescription medicines only as told by your health care provider. ?Ask your health care provider if the medicine prescribed to you: ?Requires you to avoid driving or using machinery. ?Can cause constipation. You may need to take these actions to prevent or treat constipation: ?Drink enough fluid to keep your urine pale yellow. ?Take over-the-counter or prescription medicines. ?Eat foods that are high in fiber, such as beans, whole grains, and fresh fruits and vegetables. ?Limit foods that are high in fat and processed sugars, such as fried or sweet foods. ?Incision care ?Follow instructions from your health care provider about how to take care of your incisions. Make sure you: ?Wash your hands with soap and water for at least 20 seconds before and after you change your bandage (dressing). If soap and water are not available, use hand sanitizer. ?Change your dressing as told by your health care provider. ?Leave stitches (sutures), skin glue, or adhesive strips in place. These skin closures may need to stay in place for 2 weeks or longer. If adhesive strip edges start to loosen and curl up, you may trim the loose edges. Do not remove adhesive strips completely unless your health care provider tells you to do that. ?Check your incision area every day for signs of infection. Check for: ?More redness, swelling, or pain. ?More fluid or  blood. ?Warmth. ?Pus or a bad smell. ?Wear loose, soft clothing while your incisions heal. ?Managing pain and swelling ?If directed, put ice on the painful or swollen areas. To do this: ?Put ice in a plastic bag. ?Place a towel between your skin and the bag. ?Leave the ice on for 20 minutes, 2-3 times a day. ?Remove the ice if your skin turns bright red. This is very important. If you cannot feel pain, heat, or cold, you have a greater risk of damage to the area. ?  ?Activity ?Do not lift anything that is heavier than 10 lb (4.5 kg), or the limit that you are told, until your health care provider says that it is safe. ?Ask your health care provider what activities are safe for you. A lot of activity during the first week after surgery can increase pain and swelling. For 1 week after your procedure: ?Avoid activities that take a lot of effort, such as exercise or sports. ?You may walk and climb stairs as needed for daily activity, but avoid long walks or climbing stairs for exercise. ?General instructions ?If you were given a sedative during the procedure, it can affect you for several hours. Do not drive or operate machinery until your health care provider says that it is safe. ?Do not take baths, swim, or use a hot tub until your health care provider approves. Ask your health care provider if you may take showers. You may only be allowed to take sponge baths. ?Do not use any products that contain nicotine or tobacco. These products include cigarettes, chewing tobacco, and vaping devices, such as e-cigarettes. If you need help quitting, ask your health   care provider. ?Keep all follow-up visits. This is important. ?Contact a health care provider if: ?You have any of these signs of infection: ?More redness, swelling, or pain around your incisions or your groin area. ?More fluid or blood coming from an incision. ?Warmth coming from an incision. ?Pus or a bad smell coming from an incision. ?A fever or chills. ?You have  more swelling in your scrotum, if you are male. ?You have severe pain and medicines do not help. ?You have abdominal pain or swelling. ?You cannot urinate or have a bowel movement. ?You faint or feel dizzy. ?You have nausea and vomiting. ?Get help right away if: ?You have redness, warmth, or pain in your leg. ?You have chest pain. ?You have problems breathing. ?These symptoms may represent a serious problem that is an emergency. Do not wait to see if the symptoms will go away. Get medical help right away. Call your local emergency services (911 in the [place] Do not drive yourself to the hospital. ?Summary ?Pain, swelling, and bruising are common after the procedure. ?Check your incision area every day for signs of infection, such as more redness, swelling, or pain. ?Put ice on painful or swollen areas for 20 minutes, 2-3 times a day. ?This information is not intended to replace advice given to you by your health care provider. Make sure you discuss any questions you have with your health care provider. ?Document Revised: 12/18/2019 Document Reviewed: 12/18/2019 ?Elsevier Patient Education ? 2022 [employer] ?   ? ?AMBULATORY SURGERY  ?DISCHARGE INSTRUCTIONS ? ? ?The drugs that you were given will stay in your system until tomorrow so for the next 24 hours you should not: ? ?Drive an automobile ?Make any legal decisions ?Drink any alcoholic beverage ? ? ?You may resume regular meals tomorrow.  Today it is better to start with liquids and gradually work up to solid foods. ? ?You may eat anything you prefer, but it is better to start with liquids, then soup and crackers, and gradually work up to solid foods. ? ? ?Please notify your doctor immediately if you have any unusual bleeding, trouble breathing, redness and pain at the surgery site, drainage, fever, or pain not relieved by medication. ? ? ? ?Additional Instructions: ? ? ? ? ?Please contact your physician with any problems or Same Day Surgery at [phone],  Monday through Friday 6 am to 4 pm, or [facility] number at [phone].  ?

## 2021-03-03 NOTE — Anesthesia Preprocedure Evaluation (Signed)
Anesthesia Evaluation  Patient identified by MRN, date of birth, ID band Patient awake    Reviewed: Allergy & Precautions, NPO status , Patient's Chart, lab work & pertinent test results  History of Anesthesia Complications (+) PONV and history of anesthetic complications  Airway Mallampati: II  TM Distance: >3 FB Neck ROM: Full    Dental  (+) Poor Dentition, Missing   Pulmonary neg sleep apnea, neg COPD, former smoker,    breath sounds clear to auscultation- rhonchi (-) wheezing      Cardiovascular hypertension, Pt. on medications (-) CAD, (-) Past MI, (-) Cardiac Stents and (-) CABG  Rhythm:Regular Rate:Normal - Systolic murmurs and - Diastolic murmurs    Neuro/Psych neg Seizures PSYCHIATRIC DISORDERS Anxiety Depression Bipolar Disorder negative neurological ROS     GI/Hepatic Neg liver ROS, hiatal hernia, GERD  ,  Endo/Other  neg diabetesHypothyroidism   Renal/GU Renal InsufficiencyRenal disease     Musculoskeletal  (+) Arthritis ,   Abdominal (+) - obese,   Peds  Hematology  (+) anemia ,   Anesthesia Other Findings Past Medical History: No date: ADHD (attention deficit hyperactivity disorder) No date: Anemia No date: Anxiety No date: Bipolar disorder (HCC) No date: BPH (benign prostatic hyperplasia) No date: Chronic kidney disease, stage 3b (HCC) No date: DDD (degenerative disc disease), cervical No date: Depression No date: Deviated septum No date: ED (erectile dysfunction) No date: GERD (gastroesophageal reflux disease) No date: Graves disease No date: History of hiatal hernia No date: Hypertension No date: 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 No date: Hypothyroidism No date: Pneumonia No date: PONV (postoperative nausea and vomiting)   Reproductive/Obstetrics                             Anesthesia Physical Anesthesia Plan  ASA: 3  Anesthesia  Plan: General   Post-op Pain Management:    Induction: Intravenous  PONV Risk Score and Plan: 2 and Ondansetron, Aprepitant, Dexamethasone and Midazolam  Airway Management Planned: Oral ETT  Additional Equipment:   Intra-op Plan:   Post-operative Plan: Extubation in OR  Informed Consent: I have reviewed the patients History and Physical, chart, labs and discussed the procedure including the risks, benefits and alternatives for the proposed anesthesia with the patient or authorized representative who has indicated his/her understanding and acceptance.     Dental advisory given  Plan Discussed with: CRNA and Anesthesiologist  Anesthesia Plan Comments:         Anesthesia Quick Evaluation

## 2021-03-04 ENCOUNTER — Encounter: Payer: Self-pay | Admitting: Surgery

## 2021-03-04 NOTE — Telephone Encounter (Signed)
Albert [name] is currently at 250 mg. He states he is doing well so far. He had hernia surgery yesterday and is on pain medication. He plans to wean to 200 mg tonight or tomorrow. He asked to let him know if there was any reason he should not wean to 200, or any other concerns.

## 2021-03-09 ENCOUNTER — Telehealth: Payer: Self-pay | Admitting: Psychiatry

## 2021-03-09 NOTE — Telephone Encounter (Signed)
Pt called and said that he is weaning off the seroquel. He was down to 200 mg of the seoquel and he started having withdrawal symptoms and sucidal thoughts. He went back up to 250 mg because he knew he was good on that. Please call him at [phone]

## 2021-03-09 NOTE — Telephone Encounter (Signed)
He may just need to taper slower than most people.  If he can tolerate the 250 mg Seroquel with the jitteriness it should get better over the next 1 to 2 weeks.  Then he can try going down again by 50 mg at a time.  He probably needs to go down by just 50 mg every 2 to 4 weeks and wait till he fully adjusts to each reduction before he reduces again.

## 2021-03-09 NOTE — Telephone Encounter (Signed)
Pt stated he starting back taking 250 mg on Saturday due to the symptoms he was having.Now he is no longer having suicidal thoughts but still very jittery.Please advise

## 2021-03-09 NOTE — Telephone Encounter (Signed)
Pt informed

## 2021-03-17 ENCOUNTER — Ambulatory Visit: Payer: Medicare PPO | Admitting: Psychiatry

## 2021-03-17 ENCOUNTER — Encounter: Payer: Self-pay | Admitting: Psychiatry

## 2021-03-17 ENCOUNTER — Other Ambulatory Visit: Payer: Self-pay

## 2021-03-17 DIAGNOSIS — F4001 Agoraphobia with panic disorder: Secondary | ICD-10-CM

## 2021-03-17 DIAGNOSIS — F5105 Insomnia due to other mental disorder: Secondary | ICD-10-CM | POA: Diagnosis not present

## 2021-03-17 DIAGNOSIS — F411 Generalized anxiety disorder: Secondary | ICD-10-CM | POA: Diagnosis not present

## 2021-03-17 DIAGNOSIS — F314 Bipolar disorder, current episode depressed, severe, without psychotic features: Secondary | ICD-10-CM | POA: Diagnosis not present

## 2021-03-17 DIAGNOSIS — Z79899 Other long term (current) drug therapy: Secondary | ICD-10-CM

## 2021-03-17 MED ORDER — QUETIAPINE FUMARATE 25 MG PO TABS: 25.0000 mg | ORAL_TABLET | Freq: Every day | ORAL | 1 refills | Status: DC

## 2021-03-17 MED ORDER — QUETIAPINE FUMARATE 200 MG PO TABS: 200.0000 mg | ORAL_TABLET | Freq: Every day | ORAL | 1 refills | Status: DC

## 2021-03-17 MED ORDER — BUSPIRONE HCL 30 MG PO TABS: 30.0000 mg | ORAL_TABLET | Freq: Two times a day (BID) | ORAL | 1 refills | Status: DC

## 2021-03-17 NOTE — Patient Instructions (Addendum)
Reduce Seroquel to 250 mg for 2 weeks, Then reduce to 225 mg for 2 weeks, Then reduce to 200 mg for 2 weeks, Then reduce to 175 mg for 2 weeks, Then reduce to 150 mg  Buspirone 30 mg tablets for anxiety Start 1/3 tablet twice daily for 1 week Then increase to two thirds twice daily for 1 week Then increase to 1 tablet twice daily

## 2021-03-17 NOTE — Progress Notes (Signed)
DOT HENDEL [id number] 10-07-1965 55 y.o.    Subjective:   Patient ID:  Albert [name] is a 55 y.o. (DOB [birth year]) male.  Chief Complaint:  Chief Complaint  Patient presents with   Follow-up    Severe bipolar I disorder, current or most recent episode depressed ([place])   Depression   Sleeping Problem   Medication Reaction    Depression        Associated symptoms include decreased concentration.  Associated symptoms include no headaches and no suicidal ideas.  Past medical history includes anxiety.   Anxiety Symptoms include decreased concentration and nervous/anxious behavior. Patient reports no confusion, dizziness, palpitations or suicidal ideas.     Albert [name] presents to the office today for follow-up of mixed bipolar, anxiety and still grieving stress of breakup.  He requires frequent follow-up because of long-term symptoms.  At visit October 25, 2018.  We made several medicine changes because of his concerns about sleep and other issues.  We increased olanzapine back to 7.5 mg bc not sleeping as well at 5 mg.  He has to have the prescription for quetiapine written for up to 3 tablets at night in case insomnia is worse because insomnia makes his mood disorder so much worse.  We discussed that was above the usual max but the request was granted given his treatment resistant status. We also reduce lithium from 900 mg daily to 750 mg daily to try to reduce tremor and muscle twitches.  At  visit March 28, 2019.  Fluoxetine was increased to 40 mg daily.   Early January 2021 increased to 60 mg daily.  No SE.  seen June 28, 2019.  No further meds were changed except olanzapine was increased back to 10 mg daily to see if if he could get additional benefit per his request.  Covid vaccinated.  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  Then had cholecystectomy.  April 2021 appt with the following noted: 2 episodes night sweats lately.  Memory has been very bad lately.  Repeatedly asks mother  questions. Still  tend to stay in his room and his bed.  Still has rapid cycling mood swings.  Maybe some better with increase in olanzapine to 10 and tolerating itl.  Would like to get out more but can't DT Covid.  Can perform necessary chores.  Will get out of the house when he can.  No change in death thoughts and anxiety in intensity but is better with frequency.  Usually comes and goes in waves but more persistent.  Consistent with meds.  Ativan not helping anxiety very dramatically but he's not sure.  Fidgety.  No trigger other than still grieving relationship and can't get it out of his head.  Poor energy, concentration and more forgetful. In bed more and less active.  Sleep ok lately which is unusual.  Can concentrate on financial matters and stock market at times.  Tolerating meds.  Still some intrusive SI without reason. Less frequent obsessive thoughts about broken relationship and has been doing this for months.  Can't let it go.  Loop.   No unusual stress even with the family who is supportive. Likes the benefit that Zyprexa gives. No sleepwalking nor falling nor odd behavior.  No history of sleep walking.  He understands this may recur with an increase.  Also wants option to rarely take extra quetiapine 300 for sleep prn. Plan:  Try to reduce lorazepam if possible.  10/22/2019 appointment with the following  noted: Continues fluoxetine 60, lithium 600 mg, lorazepam 2 mg AM and HS and 1 mg midday, olanzapine 10, quetiapine 600 mg HS. Still anxious chronically and including driving in crowded spaces.  Doesn't thing Ativan helps as well as Xanax but less cognitive problems. Sleep is not as good.  Occ EFA.  More EMA and wanting to do things in the middle of the night but this is not typical.  Wonders about why that happens.  Some napping.   Tolerating meds.  Asks about weight loss meds.  Disc this in detail.   Plan no changes except OK meclizine prn vertigo.  02/11/20 appt with the following  noted: Able to gradually reduce lorazepam to 1 mg AM and HS. More depressed over time and less interested in things and less interest in going out but does with his parents. Has reduced from 800 to 600 mg HS with some awakening but usually able to go back to sleep.[name]  Spending a good amount of time in bed bc watches TV in bedroom.  Parents watch TV in different part of the house.  No mood swings he notices.   Concerns about weight gain about 200#. Likes olanzapine's benefit for sleep. Plan: Trial for TRD to  Increase fluoxetine to 80 mg daily  to use the combo with olanzapine for TR bipolar depression.   04/21/20 appt with following noted: I thought in beginning some benefit with fluoxetine.  Lifelong negative thinking continues.   Not much bipolar.  Reads a lot on bipolar.   Still tired and anxious a little more may be seasonal.  No familial stressors.  Tends to lay down in afternoon.  Anxiety not over anything in particular.   No SE with fluoxetine. Took meclizine prn.  Easily motion sick.    Asks questions about newer drugs for bipolar like Caplyta. Plan:  No med changes  06/30/20 appt noted: Tired of wearing masks.  Vaccinated.  Asked questions about when this will end with mask mandate.  Mood up and down some since here and getting up 2-3 times.  Disc awakening problems.  Trying to do better bc night eating some.   Mood is about average today so far.  Albert [name] out with friends for breakfast.  Recognizes activity helps mood.   3 days in a row napped a lot in the afternoon.   Bed is a comfort place.  Gets bored and hard to motivate.  Tries to stay away from night eating and spending.   More depressed when alone and wonders about med changes. Plan: Failed response to olanzapine + fluoxetine for TR bipolar depression.   Reduce fluoxetine to 1 daily and reduce olanzapine to one half nightly for 10 days and then stop it. Then start Caplyta 1 daily  08/27/2020 appointment with the following  noted: Patient decompensated with the above switch to Caplyta with intrusive suicidal thoughts and had to be hospitalized for psychiatric reasons.  Multiple phone calls with family members since that time.  Spoke with the psychiatric nurse practitioner with the decision to restart olanzapine in place of the Caplyta given the patient was more stable while on the combination of Seroquel and olanzapine than with the Caplyta. Caplyta triggered HI/SI and depressed and confused on it, and agitated and still has some of it now. No akathisia. Frustrated with lack of therapy at the hospital.   Hydroxyzine didn't help jitteriness.  Feels some better.  Fleeting SI & HI but not obsessive like it was prior to hospitalization.  Feels a little amped up and nervous.  Scared of what could happen.  Apprehensive being here talking about things. 5-6 hours sleep last night and would like to have more. At mom and dad's house right now and up more in the day. Inadvertently stopped lorazepam abruptly by mistake likely contributing to shakiness. Still some memory issues.  Trying to stay out of bed when watching TV.  Some awakening but managing. Occ fleeting SI and distracts himself.   Always spends a lot of time just laying around.   Can have anxiety for no reason like coming here, and varies in intensity without pattern.      Less panic than in the past.  Some chronic depression and hyperactivity and loudness and hyperverbal.  Usually back to sleep.  Total 6 hours but some napping, awakens 2-4 times nightly but back to sleep..  Taking quetiapine 600 mg HS.  still lacks interest and motivation.  Can follow a TV show if interested. Plan: Restart lorazepam 1 mg in the morning, 1 mg in the afternoon and 1 mg at night for anxiety Stop hydroxyzine  Increase olanzapine to 1 and 1/2 of 10 mg tablets in evening  09/05/2020 appt noted: seen with parents today at his request Made med changes noted and tolerated the changes Better than I  did last week. Now only fleeting HI/SI and "no where near what it was".  Sleeping better.  Still not a lot of energy.  Anhedonia.  Less agitation.  A lot of anxiety all the time but it's helping.  Would like more energy and motivation.   Doesn't handle stress well. Parents note he's less shakey.  He's still staying with his parents.  Only driven once since this happened and F wants him to be able to drive and function more independently.  M agrees he's better.  Memory is better but not normal.  Primarily STM problems No akathisia with olanzapine this time.    Administering his own meds but mo watched. Slept 8 hours last night. Plan consider further reductions in quetiapine  10/17/20 appt noted: Stayed on Seroquel 600 HS and olanzapine 15. Olanzapine seemed to do most for the sleep.  Also helping eliminated HI/SI for the most part.  Wants to try to increase it.  Depression is much better with less rumination also.  Fears akathisia at higher dose as in the past. Also on fluoxetine 40.   Plan: Reduce Seroquel to 1 and 1/2 tablets at night Increase olanzapine to 1 of the 15 mg tablet and 1/2 of the 5 mg tablet for 1 week,  Then, if tolerated increase the olanzapine to 20 mg or 1 of the 15 mg tablets and 1 of the 5 mg tablets. If possible then also reduce quetiapine to 1 tablet at night.  11/05/2020 appointment with the following noted: Up to olanzapine 20 mg hs for 3 days.  Reduced Serooquel to 450 mg HS.  Didn't try to go lower. Had hiatal hernia and umbilical hernia surgery recently with no problem.   No akathisia so far. Sleep ok with meds so far changes.  No mood changes yet but overall likes the smoothness of olanzapine and helping sleep without knociing him out. Still some occ HI/SI, he thinks bc of anxiety generally.  Easily anxious. Plan: Continue olanzapine 20 mg nightly longer to give it time to help mood sx reduce quetiapine to 1 tablet at night to minimize polypharmacy and reduce risk of  akathisia.  01/07/21 appt noted: Able  to reduce quetiapine 300 mg HS ok with decent sleep but still wakes a lot.  No AM hangover.  Don't have a lot to do.  Can enjoy going out and doing things.  But doesn't do it longterm.  Family is doing good.  Father started year 68 at his job.   Tolerating the meds well.  May wake more with quetiapine reduction. Lost 10#. Wonders if could try other meds he didn't tolerate before bc now can tolerate olanzapine and couldn't tolerate it in the past DT akathisia. Mood never great but can swing into depresssion without reason.   Plan: Continue olanzapine 20 mg nightly longer to give it time to help mood sx reduce quetiapine to 1/2 of the 300 mg  tablet at night to minimize polypharmacy and reduce risk of akathisia.  02/02/2021 appointment with the following noted: Tried reduced quetiapine to 150 mg and after a week had withdrawal sx including jittery and SI and he increased to 250 mg daily.  Felt better after increasing to 300 mg daily and last week reduced to 250 mg daily.  After increase quetiapine felt better in a few days.  Seems more alert with less quetiapine but can't tolerate a quick withdrawal.   Some awakening.  Plan: Reduce quetiapine by 50 mg every 2-4 weeks.  03/09/2021 phone call: Pt called and said that he is weaning off the seroquel. He was down to 200 mg of the seoquel and he started having withdrawal symptoms and sucidal thoughts. He went back up to 250 mg because he knew he was good on that.    Pt stated he starting back taking 250 mg on Saturday due to the symptoms he was having.Now he is no longer having suicidal thoughts but still very jittery. MD: He may just need to taper slower than most people.  If he can tolerate the 250 mg Seroquel with the jitteriness it should get better over the next 1 to 2 weeks.  Then he can try going down again by 50 mg at a time.  He probably needs to go down by just 50 mg every 2 to 4 weeks and wait till he fully  adjusts to each reduction before he reduces again.  03/17/21 appt noted: Hernia surgery 2 weeks ago. Seroquel 250 mg for a month then 200 mg daily and had SI and increased again to 300 mg daily.  Would love to get off it.  Withdrawal triggers intrusive HI/SI but otherwise doesn't have them No SE. Sleep is OK but not enough REM sleep.  Satisfied with other meds.   No current HI/SI.  No paranoia.  Still has anxiety and taking Ativan every 6 hours.  It works but doesn't cure it. Chronic anxiety and ask about ketamine for chronic depression.  Psych med hx extensive including ECT and  risperidone, lithium 1200 SE, Zyprexa 20-15 akathisia, Latuda 80 which caused akathisia, Abilify, Vraylar, Rexulti, aripiprazole 20 mg with akathisia, Seroquel 1000 mg,  InVega, Geodon, Saphris with side effects, symbyax, Fanapt NR .  Caplyta SE and markedly worse. lamotrigine 300 mg, Depakote 2000 mg, Tegretol,Trileptal and several of these in combinations, gabapentin, N-acetylcysteine, Nuedexta,    Belsomra with no response,  Lunesta no response, trazodone 200 mg, Xanax, clonazepam, lorazepam less sedation. clonidine, Viibryd 40 mg for 3 months with diarrhea, protriptyline with side effects, Trintellix 20 mg, Parnate 50 mg with no response,  imipramine, venlafaxine,  Emsam 12 mg for 2 months,  bupropion was side effects,  Lexapro 20  mg, sertraline, paroxetine, Deplin, fluoxetine 80 methylphenidate 60 mg,  Vyvanse, Concerta, strattera, , modafinil,  pramipexole,  amantadine , Patient prone to akathisia.  Review of Systems:  Review of Systems  Cardiovascular:  Negative for palpitations.  Gastrointestinal:  Negative for abdominal pain.  Musculoskeletal:  Positive for back pain.  Neurological:  Positive for tremors. Negative for dizziness, weakness, light-headedness and headaches.       Fidgety  Psychiatric/Behavioral:  Positive for decreased concentration. Negative for agitation, behavioral problems, confusion,  dysphoric mood, hallucinations, self-injury, sleep disturbance and suicidal ideas. The patient is nervous/anxious and is hyperactive.    Medications: I have reviewed the patient's current medications.  Current Outpatient Medications  Medication Sig Dispense Refill   acetaminophen (TYLENOL) 500 MG tablet Take 1,000 mg by mouth every 6 (six) hours as needed for moderate pain.     busPIRone (BUSPAR) 30 MG tablet Take 1 tablet (30 mg total) by mouth 2 (two) times daily. 60 tablet 1   fenofibrate (TRICOR) 145 MG tablet Take 145 mg by mouth daily.     FLUoxetine (PROZAC) 40 MG capsule Take 1 capsule (40 mg total) by mouth daily. 90 capsule 0   fluticasone (FLONASE) 50 MCG/ACT nasal spray Place 1 spray into both nostrils daily as needed for allergies or rhinitis.     hydrocortisone (ANUSOL-HC) 2.5 % rectal cream Place 1 application rectally 3 (three) times daily as needed for hemorrhoids.     levothyroxine (SYNTHROID, LEVOTHROID) 75 MCG tablet Take 75 mcg by mouth daily before breakfast.     lisinopril (ZESTRIL) 5 MG tablet Take 5 mg by mouth daily.     lithium carbonate 300 MG capsule Take 2 capsules (600 mg total) by mouth daily. 180 capsule 1   LORazepam (ATIVAN) 1 MG tablet Take 1 tablet (1 mg total) by mouth every 8 (eight) hours. 90 tablet 3   meclizine (ANTIVERT) 25 MG tablet Take 25 mg by mouth 3 (three) times daily as needed for dizziness.     OLANZapine (ZYPREXA) 20 MG tablet TAKE 1 TABLET BY MOUTH AT BEDTIME 30 tablet 1   polycarbophil (FIBERCON) 625 MG tablet Take 1,250 mg by mouth daily.     sildenafil (REVATIO) 20 MG tablet Take 5 tablets (100 mg total) by mouth daily as needed (ED). 30 tablet 11   tamsulosin (FLOMAX) 0.4 MG CAPS capsule Take 1 capsule (0.4 mg total) by mouth daily. 90 capsule 3   HYDROcodone-acetaminophen (NORCO/VICODIN) 5-325 MG tablet Take 1-2 tablets by mouth every 4 (four) hours as needed for moderate pain. (Patient not taking: Reported on 03/17/2021) 20 tablet 0    QUEtiapine (SEROQUEL) 200 MG tablet Take 1 tablet (200 mg total) by mouth at bedtime. 30 tablet 1   QUEtiapine (SEROQUEL) 25 MG tablet Take 1 tablet (25 mg total) by mouth at bedtime. 30 tablet 1   No current facility-administered medications for this visit.   Medication Side Effects: Other: mild sleepiness.   Occ twitches.\, tremor  Allergies:  Allergies  Allergen Reactions   Meloxicam Other (See Comments)    Other reaction(s): Dizziness   Ibuprofen     Can not take because taking lithium   Prednisone     Can't sleep    Wellbutrin [Bupropion]     Suicide thoughts    Past Medical History:  Diagnosis Date   ADHD (attention deficit hyperactivity disorder)    Anemia    Anxiety    Bipolar disorder (HCC)    BPH (benign prostatic hyperplasia)  Chronic kidney disease, stage 3b (HCC)    DDD (degenerative disc disease), cervical    Depression    Deviated septum    ED (erectile dysfunction)    GERD (gastroesophageal reflux disease)    Graves disease    History of hiatal hernia    Hypertension    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    Hypothyroidism    Pneumonia    PONV (postoperative nausea and vomiting)     Family History  Problem Relation Age of Onset   Prostate cancer Neg Hx    Bladder Cancer Neg Hx    Kidney cancer Neg Hx     Social History   Socioeconomic History   Marital status: Single    Spouse name: Not on file   Number of children: Not on file   Years of education: Not on file   Highest education level: Not on file  Occupational History   Not on file  Tobacco Use   Smoking status: Former    Types: Cigarettes   Smokeless tobacco: Former    Types: Nurse, children's Use: Never used  Substance and Sexual Activity   Alcohol use: Not Currently   Drug use: Never   Sexual activity: Yes  Other Topics Concern   Not on file  Social History Narrative   Not on file   Social Determinants of Health   Financial Resource  Strain: Not on file  Food Insecurity: Not on file  Transportation Needs: Not on file  Physical Activity: Not on file  Stress: Not on file  Social Connections: Not on file  Intimate Partner Violence: Not on file    Past Medical History, Surgical history, Social history, and Family history were reviewed and updated as appropriate.   Please see review of systems for further details on the patient's review from today.   Objective:   Physical Exam:  There were no vitals taken for this visit.  Physical Exam Constitutional:      General: He is not in acute distress.    Appearance: He is well-developed.  Musculoskeletal:        General: No deformity.  Neurological:     Mental Status: He is alert and oriented to person, place, and time.     Cranial Nerves: No dysarthria.     Motor: Tremor present.     Coordination: Coordination normal.     Comments: Slight tremor  Psychiatric:        Attention and Perception: Attention and perception normal. He does not perceive auditory or visual hallucinations.        Mood and Affect: Mood is anxious and depressed. Affect is not labile, blunt, angry, tearful or inappropriate.        Speech: Speech normal. Speech is not slurred.        Behavior: Behavior normal. Behavior is not slowed. Behavior is cooperative.        Thought Content: Thought content is not paranoid or delusional. Thought content does not include homicidal or suicidal ideation. Thought content does not include homicidal or suicidal plan.        Cognition and Memory: Cognition normal. He exhibits impaired recent memory.        Judgment: Judgment normal.     Comments: Insight fair He is chronically anxious   But better than last visit No manic signs noted. Some wordfinding problems ongoing. No sui or homocidal intent or plan, but occ fleeting thoughts continue  Lab Review:     Component Value Date/Time   NA 139 02/26/2021 1253   NA 142 03/20/2014 0514   K 4.0 02/26/2021 1253    K 4.1 03/20/2014 0514   CL 109 02/26/2021 1253   CL 112 (H) 03/20/2014 0514   CO2 22 02/26/2021 1253   CO2 26 03/20/2014 0514   GLUCOSE 83 02/26/2021 1253   GLUCOSE 99 03/20/2014 0514   BUN 13 02/26/2021 1253   BUN 7 03/20/2014 0514   CREATININE 1.56 (H) 02/26/2021 1253   CREATININE 0.78 03/20/2014 0514   CALCIUM 9.6 02/26/2021 1253   CALCIUM 9.5 03/20/2014 0514   PROT 7.8 08/11/2020 1750   PROT 7.3 03/18/2014 1459   ALBUMIN 4.7 08/11/2020 1750   ALBUMIN 3.2 (L) 03/18/2014 1459   AST 23 08/11/2020 1750   AST 16 03/18/2014 1459   ALT 16 08/11/2020 1750   ALT 16 03/18/2014 1459   ALKPHOS 34 (L) 08/11/2020 1750   ALKPHOS 105 03/18/2014 1459   BILITOT 0.5 08/11/2020 1750   BILITOT 0.3 03/18/2014 1459   GFRNONAA 51 (L) 02/26/2021 1253   GFRNONAA >60 03/20/2014 0514   GFRAA 54 (L) 04/18/2019 1612   GFRAA >60 03/20/2014 0514       Component Value Date/Time   WBC 6.2 02/26/2021 1253   RBC 4.17 (L) 02/26/2021 1253   HGB 13.3 02/26/2021 1253   HGB 12.6 (L) 03/20/2014 0514   HCT 39.4 02/26/2021 1253   HCT 38.4 (L) 03/20/2014 0514   PLT 431 (H) 02/26/2021 1253   PLT 346 03/20/2014 0514   MCV 94.5 02/26/2021 1253   MCV 89 03/20/2014 0514   MCH 31.9 02/26/2021 1253   MCHC 33.8 02/26/2021 1253   RDW 13.1 02/26/2021 1253   RDW 12.5 03/20/2014 0514   LYMPHSABS 3.2 03/20/2014 0514   MONOABS 1.0 03/20/2014 0514   EOSABS 0.4 03/20/2014 0514   BASOSABS 0.1 03/20/2014 0514    Lithium Lvl  Date Value Ref Range Status  08/11/2020 0.95 0.60 - 1.20 mmol/L Final    Comment:    Performed at [facility], [address], [place], [place] [address]    Last lithium level Sept 0.8.   Last lithium level July 27, 2018 was normal at 1.0.   Lithium level [facility] October 03, 2018 = 1.2. Said he got lithium level at [place] as requested.  Labs not in Epic.  Recent lipids ok except higher TG than usual.  Normal A1C.  No results found for: PHENYTOIN, PHENOBARB, VALPROATE, CBMZ    .res Assessment: Plan:    Albert [name] was seen today for follow-up, depression, sleeping problem and medication reaction.  Diagnoses and all orders for this visit:  Severe bipolar I disorder, current or most recent episode depressed ([facility]) -     QUEtiapine (SEROQUEL) 25 MG tablet; Take 1 tablet (25 mg total) by mouth at bedtime. -     QUEtiapine (SEROQUEL) 200 MG tablet; Take 1 tablet (200 mg total) by mouth at bedtime.  Generalized anxiety disorder -     busPIRone (BUSPAR) 30 MG tablet; Take 1 tablet (30 mg total) by mouth 2 (two) times daily.  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  He usually has mixed bipolar symptoms which we have not been able to completely eliminate.  See long list of meds tried.   He is somewhat chronically unstable but better on olanzapine worse if reduces quetiapine  quickly.  Discussed mother's concerns about his memory which is primarily a short-term memory issue.  We discussed the alternative options of using Namenda off label for mild cognitive impairment.  We are not yet able to reduce the dose of lorazepam but that could potentially help as well.  His memory was much worse on Xanax than lorazepam. We discussed the short-term risks associated with benzodiazepines including sedation and increased fall risk among others.  Discussed long-term side effect risk including dependence, potential withdrawal symptoms, and the potential eventual dose-related risk of dementia. Disc newer studies refute dementia risks.  Unfortunately due to the severity of set his symptoms polypharmacy is a necessity.  Disc unusual combo antipsychotics but it helps more than other options.  Discussed potential metabolic side effects associated with atypical antipsychotics, as well as potential risk for movement side effects. Advised pt to contact office if movement side effects occur.  He had some falling  around the time when he previously took olanzapine but he took it for several months at this dosage before he had any side effect issues and he was on Xanax at the time that the following occurred. Take LED Seroquel to sleep.   No obvious alternatives for sleep  Lithium being used bc chronic SI and death thoughts.   Counseled patient regarding potential benefits, risks, and side effects of lithium to include potential risk of lithium affecting thyroid and renal function.  Discussed need for periodic lab monitoring to determine drug level and to assess for potential adverse effects.  Counseled patient regarding signs and symptoms of lithium toxicity and advised that they notify office immediately or seek urgent medical attention if experiencing these signs and symptoms.  Patient advised to contact office with any questions or concerns. Continue lithium 2 of the 300 mg capsules. Lithium level 0.8.  On 750 mg daily.   Call if death thoughts worsen or worsening SI.  Continue lorazepam 1 mg in the morning, 1 mg in the afternoon and 1 mg at night for anxiety Option buspirone . Disc SE.   Buspirone 30 mg tablets for anxiety Start 1/3 tablet twice daily for 1 week Then increase to two thirds twice daily for 1 week Then increase to 1 tablet twice daily  Previously he had difficulty tolerating 15 mg of olanzapine due to akathisia but he is not having akathisia at this time on 20 mg daily.  Disc SE. Disc combo with Seroquel may lower response rate but he doesn't think he can sleep without Seroquel. Will try to go as low as possible if not off of it eventually.  Will go down again.    Continue olanzapine 20 mg nightly longer to give it time to help mood sx DT severity of sx and difficulty getting off Seroquel conside rincrease.  Disc WD  and may need to go lower before he can stop it. Because he had WD reducing from 250 to 200 mg daily will have to go slowly. Reduce quetiapine by 25 mg every 2-4  weeks. Reduce Seroquel to 250 mg for 2 weeks, Then reduce to 225 mg for 2 weeks, Then reduce to 200 mg for 2 weeks, Then reduce to 175 mg for 2 weeks, Then reduce to 150 mg  We discussed the short-term risks associated with benzodiazepines including sedation and increased fall risk among others.  Discussed long-term side effect risk including dependence, potential withdrawal symptoms, and the potential eventual dose-related risk of dementia.  But recent studies from 2020 dispute this association  between benzodiazepines and dementia risk. Newer studies in 2020 do not support an association with dementia.  Discussed safety plan at length with patient.  Advised patient to contact office with any worsening signs and symptoms.  Instructed patient to go to the [facility] emergency room for evaluation if experiencing any acute safety concerns, to include suicidal intent.  Disc Ozempic for weight loss.  Has had weight gain from meds as a contributor. But lately has lost some weight.  Needs to keep up activity as much as possible for depression.  This appt was 30 mins.  FU 4 weeks   Lynder Parents, MD, DFAPA  Future Appointments  Date Time Provider Josephine  03/23/2021 10:15 AM Jules Husbands, MD [facility] None  04/15/2021  2:00 PM Cottle, Billey Co., MD [facility] None  12/30/2021  2:45 PM Billey Co, MD [facility] None    No orders of the defined types were placed in this encounter.     -------------------------------

## 2021-03-23 ENCOUNTER — Ambulatory Visit (INDEPENDENT_AMBULATORY_CARE_PROVIDER_SITE_OTHER): Payer: Medicare PPO | Admitting: Surgery

## 2021-03-23 ENCOUNTER — Other Ambulatory Visit: Payer: Self-pay

## 2021-03-23 ENCOUNTER — Encounter: Payer: Self-pay | Admitting: Surgery

## 2021-03-23 VITALS — BP 121/84 | HR 89 | Temp 98.4°F | Ht 69.0 in | Wt 195.4 lb

## 2021-03-23 DIAGNOSIS — Z09 Encounter for follow-up examination after completed treatment for conditions other than malignant neoplasm: Secondary | ICD-10-CM

## 2021-03-23 NOTE — Patient Instructions (Addendum)
If you have any concerns or questions, please feel free to call our office. Follow up as needed.   GENERAL POST-OPERATIVE PATIENT INSTRUCTIONS   WOUND CARE INSTRUCTIONS:  Keep a dry clean dressing on the wound if there is drainage. The initial bandage may be removed after 24 hours.  Once the wound has quit draining you may leave it open to air.  If clothing rubs against the wound or causes irritation and the wound is not draining you may cover it with a dry dressing during the daytime.  Try to keep the wound dry and avoid ointments on the wound unless directed to do so.  If the wound becomes bright red and painful or starts to drain infected material that is not clear, please contact your physician immediately.  If the wound is mildly pink and has a thick firm ridge underneath it, this is normal, and is referred to as a healing ridge.  This will resolve over the next 4-6 weeks.  BATHING: You may shower if you have been informed of this by your surgeon. However, Please do not submerge in a tub, hot tub, or pool until incisions are completely sealed or have been told by your surgeon that you may do so.  DIET:  You may eat any foods that you can tolerate.  It is a good idea to eat a high fiber diet and take in plenty of fluids to prevent constipation.  If you do become constipated you may want to take a mild laxative or take ducolax tablets on a daily basis until your bowel habits are regular.  Constipation can be very uncomfortable, along with straining, after recent surgery.  ACTIVITY:  You are encouraged to cough and deep breath or use your incentive spirometer if you were given one, every 15-30 minutes when awake.  This will help prevent respiratory complications and low grade fevers post-operatively if you had a general anesthetic.  You may want to hug a pillow when coughing and sneezing to add additional support to the surgical area, if you had abdominal or chest surgery, which will decrease pain  during these times.  You are encouraged to walk and engage in light activity for the next two weeks.  You should not lift, push, or pull more than 20 pounds, until 04/07/2021 as it could put you at increased risk for complications.  Twenty pounds is roughly equivalent to a plastic bag of groceries. At that time- Listen to your body when lifting, if you have pain when lifting, stop and then try again in a few days. Soreness after doing exercises or activities of daily living is normal as you get back in to your normal routine.  MEDICATIONS:  Try to take narcotic medications and anti-inflammatory medications, such as tylenol, ibuprofen, naprosyn, etc., with food.  This will minimize stomach upset from the medication.  Should you develop nausea and vomiting from the pain medication, or develop a rash, please discontinue the medication and contact your physician.  You should not drive, make important decisions, or operate machinery when taking narcotic pain medication.  SUNBLOCK Use sun block to incision area over the next year if this area will be exposed to sun. This helps decrease scarring and will allow you avoid a permanent darkened area over your incision.

## 2021-03-25 NOTE — Progress Notes (Signed)
Please following after robotic bilateral inguinal hernia repair.  He is doing very well without issues.  No pain.  No fevers.  Eating regular diet.  PE: NAD Abd: Soft, nontender incisions healing well without infection.  No evidence of recurrence. No evidence of hematomas   A/P doing very well without surgical complications. RTC prn

## 2021-04-15 ENCOUNTER — Encounter: Payer: Self-pay | Admitting: Psychiatry

## 2021-04-15 ENCOUNTER — Ambulatory Visit: Payer: Medicare PPO | Admitting: Psychiatry

## 2021-04-15 ENCOUNTER — Other Ambulatory Visit: Payer: Self-pay

## 2021-04-15 DIAGNOSIS — F5105 Insomnia due to other mental disorder: Secondary | ICD-10-CM | POA: Diagnosis not present

## 2021-04-15 DIAGNOSIS — F4001 Agoraphobia with panic disorder: Secondary | ICD-10-CM | POA: Diagnosis not present

## 2021-04-15 DIAGNOSIS — Z79899 Other long term (current) drug therapy: Secondary | ICD-10-CM

## 2021-04-15 DIAGNOSIS — F411 Generalized anxiety disorder: Secondary | ICD-10-CM | POA: Diagnosis not present

## 2021-04-15 DIAGNOSIS — R6889 Other general symptoms and signs: Secondary | ICD-10-CM

## 2021-04-15 DIAGNOSIS — F314 Bipolar disorder, current episode depressed, severe, without psychotic features: Secondary | ICD-10-CM

## 2021-04-15 MED ORDER — LORAZEPAM 0.5 MG PO TABS: ORAL_TABLET | ORAL | 1 refills | Status: DC

## 2021-04-15 NOTE — Patient Instructions (Signed)
Reduce lorazepam to 2 of the 0.5 mg tablets in the morning and the afternoon and 1 tablet at night for 2 to 4 weeks, When you are comfortable, then reduce lorazepam to 1 tablet 4 times a day for 2 to 4 weeks, Then reduce to 1 tablet 3 times a day

## 2021-04-15 NOTE — Progress Notes (Signed)
Albert [name] [id number] 02-26-1966 55 y.o.    Subjective:   Patient ID:  Albert [name] is a 55 y.o. (DOB [birth year]) male.  Chief Complaint:  Chief Complaint  Patient presents with   Follow-up   Depression    Severe bipolar I disorder, current or most recent episode depressed ([place])   Sleeping Problem    Depression        Associated symptoms include decreased concentration.  Associated symptoms include no headaches and no suicidal ideas.  Past medical history includes anxiety.   Anxiety Symptoms include decreased concentration and nervous/anxious behavior. Patient reports no confusion, dizziness, palpitations or suicidal ideas.     Albert [name] presents to the office today for follow-up of mixed bipolar, anxiety and still grieving stress of breakup.  He requires frequent follow-up because of long-term symptoms.  At visit October 25, 2018.  We made several medicine changes because of his concerns about sleep and other issues.  We increased olanzapine back to 7.5 mg bc not sleeping as well at 5 mg.  He has to have the prescription for quetiapine written for up to 3 tablets at night in case insomnia is worse because insomnia makes his mood disorder so much worse.  We discussed that was above the usual max but the request was granted given his treatment resistant status. We also reduce lithium from 900 mg daily to 750 mg daily to try to reduce tremor and muscle twitches.  At  visit March 28, 2019.  Fluoxetine was increased to 40 mg daily.   Early January 2021 increased to 60 mg daily.  No SE.  seen June 28, 2019.  No further meds were changed except olanzapine was increased back to 10 mg daily to see if if he could get additional benefit per his request.  Covid vaccinated.  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  Then had cholecystectomy.  April 2021 appt with the following noted: 2 episodes night sweats lately.  Memory has been very bad lately.  Repeatedly asks mother questions. Still  tend to  stay in his room and his bed.  Still has rapid cycling mood swings.  Maybe some better with increase in olanzapine to 10 and tolerating itl.  Would like to get out more but can't DT Covid.  Can perform necessary chores.  Will get out of the house when he can.  No change in death thoughts and anxiety in intensity but is better with frequency.  Usually comes and goes in waves but more persistent.  Consistent with meds.  Ativan not helping anxiety very dramatically but he's not sure.  Fidgety.  No trigger other than still grieving relationship and can't get it out of his head.  Poor energy, concentration and more forgetful. In bed more and less active.  Sleep ok lately which is unusual.  Can concentrate on financial matters and stock market at times.  Tolerating meds.  Still some intrusive SI without reason. Less frequent obsessive thoughts about broken relationship and has been doing this for months.  Can't let it go.  Loop.   No unusual stress even with the family who is supportive. Likes the benefit that Zyprexa gives. No sleepwalking nor falling nor odd behavior.  No history of sleep walking.  He understands this may recur with an increase.  Also wants option to rarely take extra quetiapine 300 for sleep prn. Plan:  Try to reduce lorazepam if possible.  10/22/2019 appointment with the following noted: Continues fluoxetine 60,  lithium 600 mg, lorazepam 2 mg AM and HS and 1 mg midday, olanzapine 10, quetiapine 600 mg HS. Still anxious chronically and including driving in crowded spaces.  Doesn't thing Ativan helps as well as Xanax but less cognitive problems. Sleep is not as good.  Occ EFA.  More EMA and wanting to do things in the middle of the night but this is not typical.  Wonders about why that happens.  Some napping.   Tolerating meds.  Asks about weight loss meds.  Disc this in detail.   Plan no changes except OK meclizine prn vertigo.  02/11/20 appt with the following noted: Able to gradually  reduce lorazepam to 1 mg AM and HS. More depressed over time and less interested in things and less interest in going out but does with his parents. Has reduced from 800 to 600 mg HS with some awakening but usually able to go back to sleep.[name]  Spending a good amount of time in bed bc watches TV in bedroom.  Parents watch TV in different part of the house.  No mood swings he notices.   Concerns about weight gain about 200#. Likes olanzapine's benefit for sleep. Plan: Trial for TRD to  Increase fluoxetine to 80 mg daily  to use the combo with olanzapine for TR bipolar depression.   04/21/20 appt with following noted: I thought in beginning some benefit with fluoxetine.  Lifelong negative thinking continues.   Not much bipolar.  Reads a lot on bipolar.   Still tired and anxious a little more may be seasonal.  No familial stressors.  Tends to lay down in afternoon.  Anxiety not over anything in particular.   No SE with fluoxetine. Took meclizine prn.  Easily motion sick.    Asks questions about newer drugs for bipolar like Caplyta. Plan:  No med changes  06/30/20 appt noted: Tired of wearing masks.  Vaccinated.  Asked questions about when this will end with mask mandate.  Mood up and down some since here and getting up 2-3 times.  Disc awakening problems.  Trying to do better bc night eating some.   Mood is about average today so far.  Albert [name] out with friends for breakfast.  Recognizes activity helps mood.   3 days in a row napped a lot in the afternoon.   Bed is a comfort place.  Gets bored and hard to motivate.  Tries to stay away from night eating and spending.   More depressed when alone and wonders about med changes. Plan: Failed response to olanzapine + fluoxetine for TR bipolar depression.   Reduce fluoxetine to 1 daily and reduce olanzapine to one half nightly for 10 days and then stop it. Then start [place] 1 daily  08/27/2020 appointment with the following noted: Patient decompensated with  the above switch to [place] with intrusive suicidal thoughts and had to be hospitalized for psychiatric reasons.  Multiple phone calls with family members since that time.  Spoke with the psychiatric nurse practitioner with the decision to restart olanzapine in place of the [place] given the patient was more stable while on the combination of Seroquel and olanzapine than with the [place]. Caplyta triggered HI/SI and depressed and confused on it, and agitated and still has some of it now. No akathisia. Frustrated with lack of therapy at the hospital.   Hydroxyzine didn't help jitteriness.  Feels some better.  Fleeting SI & HI but not obsessive like it was prior to hospitalization.  Feels a little  amped up and nervous.  Scared of what could happen.  Apprehensive being here talking about things. 5-6 hours sleep last night and would like to have more. At mom and dad's house right now and up more in the day. Inadvertently stopped lorazepam abruptly by mistake likely contributing to shakiness. Still some memory issues.  Trying to stay out of bed when watching TV.  Some awakening but managing. Occ fleeting SI and distracts himself.   Always spends a lot of time just laying around.   Can have anxiety for no reason like coming here, and varies in intensity without pattern.      Less panic than in the past.  Some chronic depression and hyperactivity and loudness and hyperverbal.  Usually back to sleep.  Total 6 hours but some napping, awakens 2-4 times nightly but back to sleep..  Taking quetiapine 600 mg HS.  still lacks interest and motivation.  Can follow a TV show if interested. Plan: Restart lorazepam 1 mg in the morning, 1 mg in the afternoon and 1 mg at night for anxiety Stop hydroxyzine  Increase olanzapine to 1 and 1/2 of 10 mg tablets in evening  09/05/2020 appt noted: seen with parents today at his request Made med changes noted and tolerated the changes Better than I did last week. Now only fleeting  HI/SI and "no where near what it was".  Sleeping better.  Still not a lot of energy.  Anhedonia.  Less agitation.  A lot of anxiety all the time but it's helping.  Would like more energy and motivation.   Doesn't handle stress well. Parents note he's less shakey.  He's still staying with his parents.  Only driven once since this happened and F wants him to be able to drive and function more independently.  M agrees he's better.  Memory is better but not normal.  Primarily STM problems No akathisia with olanzapine this time.    Administering his own meds but mo watched. Slept 8 hours last night. Plan consider further reductions in quetiapine  10/17/20 appt noted: Stayed on Seroquel 600 HS and olanzapine 15. Olanzapine seemed to do most for the sleep.  Also helping eliminated HI/SI for the most part.  Wants to try to increase it.  Depression is much better with less rumination also.  Fears akathisia at higher dose as in the past. Also on fluoxetine 40.   Plan: Reduce Seroquel to 1 and 1/2 tablets at night Increase olanzapine to 1 of the 15 mg tablet and 1/2 of the 5 mg tablet for 1 week,  Then, if tolerated increase the olanzapine to 20 mg or 1 of the 15 mg tablets and 1 of the 5 mg tablets. If possible then also reduce quetiapine to 1 tablet at night.  11/05/2020 appointment with the following noted: Up to olanzapine 20 mg hs for 3 days.  Reduced Serooquel to 450 mg HS.  Didn't try to go lower. Had hiatal hernia and umbilical hernia surgery recently with no problem.   No akathisia so far. Sleep ok with meds so far changes.  No mood changes yet but overall likes the smoothness of olanzapine and helping sleep without knociing him out. Still some occ HI/SI, he thinks bc of anxiety generally.  Easily anxious. Plan: Continue olanzapine 20 mg nightly longer to give it time to help mood sx reduce quetiapine to 1 tablet at night to minimize polypharmacy and reduce risk of akathisia.  01/07/21 appt  noted: Able to reduce quetiapine  300 mg HS ok with decent sleep but still wakes a lot.  No AM hangover.  Don't have a lot to do.  Can enjoy going out and doing things.  But doesn't do it longterm.  Family is doing good.  Father started year 15 at his job.   Tolerating the meds well.  May wake more with quetiapine reduction. Lost 10#. Wonders if could try other meds he didn't tolerate before bc now can tolerate olanzapine and couldn't tolerate it in the past DT akathisia. Mood never great but can swing into depresssion without reason.   Plan: Continue olanzapine 20 mg nightly longer to give it time to help mood sx reduce quetiapine to 1/2 of the 300 mg  tablet at night to minimize polypharmacy and reduce risk of akathisia.  02/02/2021 appointment with the following noted: Tried reduced quetiapine to 150 mg and after a week had withdrawal sx including jittery and SI and he increased to 250 mg daily.  Felt better after increasing to 300 mg daily and last week reduced to 250 mg daily.  After increase quetiapine felt better in a few days.  Seems more alert with less quetiapine but can't tolerate a quick withdrawal.   Some awakening.  Plan: Reduce quetiapine by 50 mg every 2-4 weeks.  03/09/2021 phone call: Pt called and said that he is weaning off the seroquel. He was down to 200 mg of the seoquel and he started having withdrawal symptoms and sucidal thoughts. He went back up to 250 mg because he knew he was good on that.    Pt stated he starting back taking 250 mg on Saturday due to the symptoms he was having.Now he is no longer having suicidal thoughts but still very jittery. MD: He may just need to taper slower than most people.  If he can tolerate the 250 mg Seroquel with the jitteriness it should get better over the next 1 to 2 weeks.  Then he can try going down again by 50 mg at a time.  He probably needs to go down by just 50 mg every 2 to 4 weeks and wait till he fully adjusts to each reduction  before he reduces again.  03/17/21 appt noted: Hernia surgery 2 weeks ago. Seroquel 250 mg for a month then 200 mg daily and had SI and increased again to 300 mg daily.  Would love to get off it.  Withdrawal triggers intrusive HI/SI but otherwise doesn't have them No SE. Sleep is OK but not enough REM sleep.  Satisfied with other meds.   No current HI/SI.  No paranoia.  Still has anxiety and taking Ativan every 6 hours.  It works but doesn't cure it. Chronic anxiety and ask about ketamine for chronic depression. Plan: Because he had WD reducing from 250 to 200 mg daily will have to go slowly. Reduce quetiapine by 25 mg every 2-4 weeks. Reduce Seroquel to 250 mg for 2 weeks, Then reduce to 225 mg for 2 weeks, Then reduce to 200 mg for 2 weeks, Then reduce to 175 mg for 2 weeks, Then reduce to 150 mg Also: Buspirone 30 mg tablets for anxiety Start 1/3 tablet twice daily for 1 week Then increase to two thirds twice daily for 1 week Then increase to 1 tablet twice daily  04/15/2021 appointment with the following noted: Reduced Seroquel to 200 mg HS and increased buspirone to 30 mg BID STM px and wants to try to reduce Ativan to see if it  is better.  But fears with drawal sx. Parents complain about his memory. Less HI and SI since here. Less depressed.  Psych med hx extensive including ECT and  risperidone, lithium 1200 SE, Zyprexa 20-15 akathisia, Latuda 80 which caused akathisia, Abilify, Vraylar, Rexulti, aripiprazole 20 mg with akathisia, Seroquel 1000 mg,  InVega, Geodon, Saphris with side effects, symbyax, Fanapt NR .  Caplyta SE and markedly worse. lamotrigine 300 mg, Depakote 2000 mg, Tegretol,Trileptal and several of these in combinations, gabapentin, N-acetylcysteine, Nuedexta,    Belsomra with no response,  Lunesta no response, trazodone 200 mg, Xanax, clonazepam, lorazepam less sedation. clonidine, Viibryd 40 mg for 3 months with diarrhea, protriptyline with side effects,  Trintellix 20 mg, Parnate 50 mg with no response,  imipramine, venlafaxine,  Emsam 12 mg for 2 months,  bupropion was side effects,  Lexapro 20 mg, sertraline, paroxetine, Deplin, fluoxetine 80 methylphenidate 60 mg,  Vyvanse, Concerta, strattera, , modafinil,  pramipexole,  amantadine , Patient prone to akathisia.  Review of Systems:  Review of Systems  Cardiovascular:  Negative for palpitations.  Gastrointestinal:  Negative for abdominal pain.  Musculoskeletal:  Positive for back pain.  Neurological:  Positive for tremors. Negative for dizziness, weakness, light-headedness and headaches.       Fidgety  Psychiatric/Behavioral:  Positive for decreased concentration. Negative for agitation, behavioral problems, confusion, dysphoric mood, hallucinations, self-injury, sleep disturbance and suicidal ideas. The patient is nervous/anxious and is hyperactive.    Medications: I have reviewed the patient's current medications.  Current Outpatient Medications  Medication Sig Dispense Refill   acetaminophen (TYLENOL) 500 MG tablet Take 1,000 mg by mouth every 6 (six) hours as needed for moderate pain.     busPIRone (BUSPAR) 30 MG tablet Take 1 tablet (30 mg total) by mouth 2 (two) times daily. 60 tablet 1   fenofibrate (TRICOR) 145 MG tablet Take 145 mg by mouth daily.     FLUoxetine (PROZAC) 40 MG capsule Take 1 capsule (40 mg total) by mouth daily. 90 capsule 0   fluticasone (FLONASE) 50 MCG/ACT nasal spray Place 1 spray into both nostrils daily as needed for allergies or rhinitis.     hydrocortisone (ANUSOL-HC) 2.5 % rectal cream Place 1 application rectally 3 (three) times daily as needed for hemorrhoids.     levothyroxine (SYNTHROID, LEVOTHROID) 75 MCG tablet Take 75 mcg by mouth daily before breakfast.     lisinopril (ZESTRIL) 5 MG tablet Take 5 mg by mouth daily.     lithium carbonate 300 MG capsule Take 2 capsules (600 mg total) by mouth daily. 180 capsule 1   LORazepam (ATIVAN) 0.5 MG tablet  2 in the AM and afternoon and 1 at night 150 tablet 1   meclizine (ANTIVERT) 25 MG tablet Take 25 mg by mouth 3 (three) times daily as needed for dizziness.     OLANZapine (ZYPREXA) 20 MG tablet TAKE 1 TABLET BY MOUTH AT BEDTIME 30 tablet 1   polycarbophil (FIBERCON) 625 MG tablet Take 1,250 mg by mouth daily.     QUEtiapine (SEROQUEL) 200 MG tablet Take 1 tablet (200 mg total) by mouth at bedtime. 30 tablet 1   sildenafil (REVATIO) 20 MG tablet Take 5 tablets (100 mg total) by mouth daily as needed (ED). 30 tablet 11   tamsulosin (FLOMAX) 0.4 MG CAPS capsule Take 1 capsule (0.4 mg total) by mouth daily. 90 capsule 3   QUEtiapine (SEROQUEL) 25 MG tablet Take 1 tablet (25 mg total) by mouth at bedtime. (Patient not taking:  Reported on 04/15/2021) 30 tablet 1   No current facility-administered medications for this visit.   Medication Side Effects: Other: mild sleepiness.   Occ twitches.\, tremor  Allergies:  Allergies  Allergen Reactions   Meloxicam Other (See Comments)    Other reaction(s): Dizziness   Ibuprofen     Can not take because taking lithium   Prednisone     Can't sleep    Wellbutrin [Bupropion]     Suicide thoughts    Past Medical History:  Diagnosis Date   ADHD (attention deficit hyperactivity disorder)    Anemia    Anxiety    Bipolar disorder (HCC)    BPH (benign prostatic hyperplasia)    Chronic kidney disease, stage 3b (HCC)    DDD (degenerative disc disease), cervical    Depression    Deviated septum    ED (erectile dysfunction)    GERD (gastroesophageal reflux disease)    Graves disease    History of hiatal hernia    Hypertension    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    Hypothyroidism    Pneumonia    PONV (postoperative nausea and vomiting)     Family History  Problem Relation Age of Onset   Prostate cancer Neg Hx    Bladder Cancer Neg Hx    Kidney cancer Neg Hx     Social History   Socioeconomic History   Marital  status: Single    Spouse name: Not on file   Number of children: Not on file   Years of education: Not on file   Highest education level: Not on file  Occupational History   Not on file  Tobacco Use   Smoking status: Former    Types: Cigarettes   Smokeless tobacco: Former    Types: Nurse, children's Use: Never used  Substance and Sexual Activity   Alcohol use: Not Currently   Drug use: Never   Sexual activity: Yes  Other Topics Concern   Not on file  Social History Narrative   Not on file   Social Determinants of Health   Financial Resource Strain: Not on file  Food Insecurity: Not on file  Transportation Needs: Not on file  Physical Activity: Not on file  Stress: Not on file  Social Connections: Not on file  Intimate Partner Violence: Not on file    Past Medical History, Surgical history, Social history, and Family history were reviewed and updated as appropriate.   Please see review of systems for further details on the patient's review from today.   Objective:   Physical Exam:  There were no vitals taken for this visit.  Physical Exam Constitutional:      General: He is not in acute distress.    Appearance: He is well-developed.  Musculoskeletal:        General: No deformity.  Neurological:     Mental Status: He is alert and oriented to person, place, and time.     Cranial Nerves: No dysarthria.     Motor: Tremor present.     Coordination: Coordination normal.     Comments: Slight tremor  Psychiatric:        Attention and Perception: Attention and perception normal. He does not perceive auditory or visual hallucinations.        Mood and Affect: Mood is anxious and depressed. Affect is not labile, blunt, angry, tearful or inappropriate.        Speech: Speech normal.  Speech is not slurred.        Behavior: Behavior normal. Behavior is not slowed. Behavior is cooperative.        Thought Content: Thought content is not paranoid or delusional. Thought  content does not include homicidal or suicidal ideation. Thought content does not include homicidal or suicidal plan.        Cognition and Memory: Cognition normal. He exhibits impaired recent memory.        Judgment: Judgment normal.     Comments: Insight fair He is chronically anxious   But better than last visit No manic signs noted. Some wordfinding problems ongoing. No sui or homocidal intent or plan, but occ fleeting thoughts continue but are less    Lab Review:     Component Value Date/Time   NA 139 02/26/2021 1253   NA 142 03/20/2014 0514   K 4.0 02/26/2021 1253   K 4.1 03/20/2014 0514   CL 109 02/26/2021 1253   CL 112 (H) 03/20/2014 0514   CO2 22 02/26/2021 1253   CO2 26 03/20/2014 0514   GLUCOSE 83 02/26/2021 1253   GLUCOSE 99 03/20/2014 0514   BUN 13 02/26/2021 1253   BUN 7 03/20/2014 0514   CREATININE 1.56 (H) 02/26/2021 1253   CREATININE 0.78 03/20/2014 0514   CALCIUM 9.6 02/26/2021 1253   CALCIUM 9.5 03/20/2014 0514   PROT 7.8 08/11/2020 1750   PROT 7.3 03/18/2014 1459   ALBUMIN 4.7 08/11/2020 1750   ALBUMIN 3.2 (L) 03/18/2014 1459   AST 23 08/11/2020 1750   AST 16 03/18/2014 1459   ALT 16 08/11/2020 1750   ALT 16 03/18/2014 1459   ALKPHOS 34 (L) 08/11/2020 1750   ALKPHOS 105 03/18/2014 1459   BILITOT 0.5 08/11/2020 1750   BILITOT 0.3 03/18/2014 1459   GFRNONAA 51 (L) 02/26/2021 1253   GFRNONAA >60 03/20/2014 0514   GFRAA 54 (L) 04/18/2019 1612   GFRAA >60 03/20/2014 0514       Component Value Date/Time   WBC 6.2 02/26/2021 1253   RBC 4.17 (L) 02/26/2021 1253   HGB 13.3 02/26/2021 1253   HGB 12.6 (L) 03/20/2014 0514   HCT 39.4 02/26/2021 1253   HCT 38.4 (L) 03/20/2014 0514   PLT 431 (H) 02/26/2021 1253   PLT 346 03/20/2014 0514   MCV 94.5 02/26/2021 1253   MCV 89 03/20/2014 0514   MCH 31.9 02/26/2021 1253   MCHC 33.8 02/26/2021 1253   RDW 13.1 02/26/2021 1253   RDW 12.5 03/20/2014 0514   LYMPHSABS 3.2 03/20/2014 0514   MONOABS 1.0  03/20/2014 0514   EOSABS 0.4 03/20/2014 0514   BASOSABS 0.1 03/20/2014 0514    Lithium Lvl  Date Value Ref Range Status  08/11/2020 0.95 0.60 - 1.20 mmol/L Final    Comment:    Performed at [facility], [address] [place], [place] [address]    Last lithium level Sept 0.8.   Last lithium level July 27, 2018 was normal at 1.0.   Lithium level [facility] October 03, 2018 = 1.2. Said he got lithium level at [place] as requested.  Labs not in Epic.  Recent lipids ok except higher TG than usual.  Normal A1C.  No results found for: PHENYTOIN, PHENOBARB, VALPROATE, CBMZ   .res Assessment: Plan:    Albert [name] was seen today for follow-up, depression and sleeping problem.  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  He usually has mixed bipolar symptoms which we have not been able to completely eliminate.  See long list of meds tried.   He is somewhat chronically unstable but better on olanzapine worse if reduces quetiapine quickly.  Discussed mother's concerns about his memory which is primarily a short-term memory issue.  We discussed the alternative options of using Namenda off label for mild cognitive impairment.  We are not yet able to reduce the dose of lorazepam but that could potentially help as well.  His memory was much worse on Xanax than lorazepam. We discussed the short-term risks associated with benzodiazepines including sedation and increased fall risk among others.  Discussed long-term side effect risk including dependence, potential withdrawal symptoms, and the  potential eventual dose-related risk of dementia. Disc newer studies refute dementia risks.  Unfortunately due to the severity of set his symptoms polypharmacy is a necessity.  Disc unusual combo antipsychotics but it helps more than other options.  Discussed potential metabolic side effects associated with atypical antipsychotics, as well as potential risk for movement side effects. Advised pt to contact office if movement side effects occur.  He had some falling around the time when he previously took olanzapine but he took it for several months at this dosage before he had any side effect issues and he was on Xanax at the time that the following occurred. Take LED Seroquel to sleep.   No obvious alternatives for sleep  Lithium being used bc chronic SI and death thoughts.   Counseled patient regarding potential benefits, risks, and side effects of lithium to include potential risk of lithium affecting thyroid and renal function.  Discussed need for periodic lab monitoring to determine drug level and to assess for potential adverse effects.  Counseled patient regarding signs and symptoms of lithium toxicity and advised that they notify office immediately or seek urgent medical attention if experiencing these signs and symptoms.  Patient advised to contact office with any questions or concerns. Continue lithium 2 of the 300 mg capsules. Lithium level 0.8.  On 750 mg daily.   Call if death thoughts worsen or worsening SI.  Continue lorazepam 1 mg in the morning, 1 mg in the afternoon and 1 mg at night for anxiety Option buspirone . Disc SE.   Buspirone 30 mg tablets for anxiety Start 1/3 tablet twice daily for 1 week Then increase to two thirds twice daily for 1 week Then increase to 1 tablet twice daily  Previously he had difficulty tolerating 15 mg of olanzapine due to akathisia but he is not having akathisia at this time on 20 mg daily.  Disc SE. Disc combo with Seroquel may lower response  rate but he doesn't think he can sleep without Seroquel. Will try to go as low as possible if not off of it eventually.  Will go down again.    Continue olanzapine 20 mg nightly longer to give it time to help mood sx DT severity of sx and difficulty getting off Seroquel conside rincrease.  Disc WD  and may need to go lower before he can stop it. Because he  had WD reducing from 250 to 200 mg daily will have to go slowly. Reduce quetiapine by 25 mg every 2-4 weeks. Reduce Seroquel to 250 mg for 2 weeks, Then reduce to 225 mg for 2 weeks, Then reduce to 200 mg for 2 weeks, Then reduce to 175 mg for 2 weeks, Then reduce to 150 mg  Trial taper lorazepam: In hopes of improving short-term memory Reduce lorazepam to 2 of the 0.5 mg tablets in the morning and the afternoon and 1 tablet at night for 2 to 4 weeks, When you are comfortable, then reduce lorazepam to 1 tablet 4 times a day for 2 to 4 weeks, Then reduce to 1 tablet 3 times a day  We discussed the short-term risks associated with benzodiazepines including sedation and increased fall risk among others.  Discussed long-term side effect risk including dependence, potential withdrawal symptoms, and the potential eventual dose-related risk of dementia.  But recent studies from 2020 dispute this association between benzodiazepines and dementia risk. Newer studies in 2020 do not support an association with dementia.  Discussed safety plan at length with patient.  Advised patient to contact office with any worsening signs and symptoms.  Instructed patient to go to the [facility] emergency room for evaluation if experiencing any acute safety concerns, to include suicidal intent.  Disc Ozempic and Monjauro for weight loss.  Has had weight gain from meds as a contributor. But lately has lost some weight.  Needs to keep up activity as much as possible for depression.  This appt was 30 mins.  FU 4 weeks   Meredith Staggers, MD, DFAPA  Future  Appointments  Date Time Provider Department Center  05/21/2021  1:30 PM Cottle, Steva Ready., MD [facility] None  12/30/2021  2:45 PM Sondra Come, MD [facility] None    No orders of the defined types were placed in this encounter.     -------------------------------

## 2021-04-16 ENCOUNTER — Telehealth: Payer: Self-pay | Admitting: Psychiatry

## 2021-04-16 ENCOUNTER — Other Ambulatory Visit: Payer: Self-pay | Admitting: Psychiatry

## 2021-04-16 DIAGNOSIS — F314 Bipolar disorder, current episode depressed, severe, without psychotic features: Secondary | ICD-10-CM

## 2021-04-16 MED ORDER — QUETIAPINE FUMARATE 150 MG PO TABS
150.0000 mg | ORAL_TABLET | Freq: Every day | ORAL | 0 refills | Status: DC
Start: 2021-04-16 — End: 2021-05-21

## 2021-04-16 NOTE — Telephone Encounter (Signed)
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 He asks if he needs a prescription for Seroquel 150 mg to be able to take taper the dosage? His phone number is [phone]. Pharmacy is:  [facility] - [place], [place] - [id number]  Phone:  [phone]  Fax:  [phone]

## 2021-04-16 NOTE — Telephone Encounter (Signed)
Spoke to pharmacy and clarified 

## 2021-04-16 NOTE — Telephone Encounter (Signed)
Please review

## 2021-04-16 NOTE — Telephone Encounter (Signed)
Sent prescription for 150 mg Seroquel

## 2021-04-16 NOTE — Telephone Encounter (Signed)
Albert [name], Pharmacist at [facility] says they are confused by the latest script.  He went from 1.5 mg per day of the Lorazepam to 0.5 mg for 4 times a day, which we end up being a total of 2mg  per day.  Pls call to clarify dosage   Next appt 1/19

## 2021-04-17 ENCOUNTER — Telehealth: Payer: Self-pay | Admitting: Psychiatry

## 2021-04-17 ENCOUNTER — Other Ambulatory Visit: Payer: Self-pay | Admitting: Psychiatry

## 2021-04-17 DIAGNOSIS — F314 Bipolar disorder, current episode depressed, severe, without psychotic features: Secondary | ICD-10-CM

## 2021-04-17 MED ORDER — QUETIAPINE FUMARATE 50 MG PO TABS: 150.0000 mg | ORAL_TABLET | Freq: Every day | ORAL | 0 refills | Status: DC

## 2021-04-17 NOTE — Telephone Encounter (Signed)
Albert [name] at [facility] called and needs clarification on the directions for Quetiapine 50 mg. Please call at [phone].

## 2021-04-17 NOTE — Telephone Encounter (Signed)
Please review

## 2021-04-17 NOTE — Telephone Encounter (Signed)
Spoke to pharmacy and clarified 

## 2021-04-17 NOTE — Telephone Encounter (Signed)
Albert [name] at [facility] called regarding Albert [name] Quetiapine 50 mg with directions being 3/bedtime. Pharmacy needs clarification on the directions. Please call [phone].

## 2021-04-17 NOTE — Telephone Encounter (Signed)
Sherri from [facility] called.  The script for Seroquel 150mg  only comes in ER.  Is Dr. wanting the pt to take the ER version or (3) 50mg  pills for the 150mg  regular version.  Next appt 1/19

## 2021-04-17 NOTE — Telephone Encounter (Signed)
Interesting.  This is an error in Epic which lists an immediate release option for 150 mg .  RX sent for immediate release quetiapine 50 mg tablets 3 nightly #90 no refills

## 2021-04-20 ENCOUNTER — Other Ambulatory Visit: Payer: Self-pay | Admitting: Psychiatry

## 2021-04-20 DIAGNOSIS — F411 Generalized anxiety disorder: Secondary | ICD-10-CM

## 2021-04-30 ENCOUNTER — Telehealth: Payer: Self-pay | Admitting: Psychiatry

## 2021-04-30 NOTE — Telephone Encounter (Signed)
Yesterday pt started taking 4 tabs of ativan.He stated he has a yucky feeling like he's not in control.He also is having withdrawal symptoms but did not elaborate when I asked.He also needs 175 mg of Seroquel sent in since he is down to that dose.

## 2021-04-30 NOTE — Telephone Encounter (Signed)
Albert [name] called because he has started to reduce the Lorazepam and reduced by 1 tablet but already is experiencing some withdrawal symptoms.  Please call to review how he is to be reducing and what he should expect to experience.

## 2021-04-30 NOTE — Telephone Encounter (Signed)
He has lorazepam 0.5 mg tablets and it was prescribed at 5 a day and he is reduced it to 4 a day and is complaining of some withdrawal symptoms. He should not try to reduce Seroquel at the same time he is trying to reduce the quetiapine. If the withdrawal symptoms are too bothersome he can alternate 4 tablets a day with with 5 tablets the next day for 2 weeks and then drop it to 4 tablets a day.  The lorazepam 0.5 mg tablets are probably two-point small to try to split which would be the other option

## 2021-04-30 NOTE — Telephone Encounter (Signed)
Pt informed

## 2021-05-05 ENCOUNTER — Other Ambulatory Visit: Payer: Self-pay | Admitting: Psychiatry

## 2021-05-05 DIAGNOSIS — F314 Bipolar disorder, current episode depressed, severe, without psychotic features: Secondary | ICD-10-CM

## 2021-05-06 NOTE — Telephone Encounter (Signed)
Pt called and is out of his lithium. Please send it in today

## 2021-05-16 ENCOUNTER — Other Ambulatory Visit: Payer: Self-pay | Admitting: Psychiatry

## 2021-05-16 DIAGNOSIS — F411 Generalized anxiety disorder: Secondary | ICD-10-CM

## 2021-05-16 DIAGNOSIS — F4001 Agoraphobia with panic disorder: Secondary | ICD-10-CM

## 2021-05-16 DIAGNOSIS — F314 Bipolar disorder, current episode depressed, severe, without psychotic features: Secondary | ICD-10-CM

## 2021-05-21 ENCOUNTER — Ambulatory Visit: Payer: Medicare PPO | Admitting: Psychiatry

## 2021-05-21 ENCOUNTER — Encounter: Payer: Self-pay | Admitting: Psychiatry

## 2021-05-21 ENCOUNTER — Other Ambulatory Visit: Payer: Self-pay

## 2021-05-21 DIAGNOSIS — F5105 Insomnia due to other mental disorder: Secondary | ICD-10-CM

## 2021-05-21 DIAGNOSIS — F4001 Agoraphobia with panic disorder: Secondary | ICD-10-CM

## 2021-05-21 DIAGNOSIS — F314 Bipolar disorder, current episode depressed, severe, without psychotic features: Secondary | ICD-10-CM | POA: Diagnosis not present

## 2021-05-21 DIAGNOSIS — F411 Generalized anxiety disorder: Secondary | ICD-10-CM | POA: Diagnosis not present

## 2021-05-21 DIAGNOSIS — R6889 Other general symptoms and signs: Secondary | ICD-10-CM

## 2021-05-21 DIAGNOSIS — Z79899 Other long term (current) drug therapy: Secondary | ICD-10-CM

## 2021-05-21 MED ORDER — LORAZEPAM 0.5 MG PO TABS: 0.5000 mg | ORAL_TABLET | Freq: Four times a day (QID) | ORAL | 1 refills | Status: DC

## 2021-05-21 MED ORDER — BUSPIRONE HCL 30 MG PO TABS: 30.0000 mg | ORAL_TABLET | Freq: Two times a day (BID) | ORAL | 0 refills | Status: DC

## 2021-05-21 MED ORDER — QUETIAPINE FUMARATE 200 MG PO TABS: 200.0000 mg | ORAL_TABLET | Freq: Every day | ORAL | 0 refills | Status: DC

## 2021-05-21 MED ORDER — FLUOXETINE HCL 40 MG PO CAPS: 40.0000 mg | ORAL_CAPSULE | Freq: Every day | ORAL | 0 refills | Status: DC

## 2021-05-21 MED ORDER — CLONIDINE HCL 0.1 MG PO TABS: ORAL_TABLET | ORAL | 1 refills | Status: DC

## 2021-05-21 NOTE — Patient Instructions (Addendum)
Continue lithium 2 capsules daily Continue lorazepam 0.5 mg tablets 1 tablet 4 times a day Continue buspirone 1 tablet twice daily Continue olanzapine 20 mg tablet 1 at night Continue Seroquel (quetiapine) 200 mg tablet 1 at night Continue fluoxetine 40 mg 1 daily Start clonidine 0.1 mg tablets one half at night for 3 nights, then 1/2 tablet twice daily for 3 nights, then one half in the morning and 1 tablet at night  If clonidine does not noticeably help anxiety call the office

## 2021-05-21 NOTE — Progress Notes (Signed)
ANUSH UDY [id number] 05-09-1965 56 y.o.    Subjective:   Patient ID:  Albert [name] is a 56 y.o. (DOB 01-26-1966) male.  Chief Complaint:  Chief Complaint  Patient presents with   Follow-up    Severe bipolar I disorder, current or most recent episode depressed ([place])   Depression   Anxiety   Sleeping Problem    Depression        Associated symptoms include decreased concentration.  Associated symptoms include no headaches and no suicidal ideas.  Past medical history includes anxiety.   Anxiety Symptoms include decreased concentration and nervous/anxious behavior. Patient reports no confusion, dizziness, palpitations or suicidal ideas.     Albert [name] presents to the office today for follow-up of mixed bipolar, anxiety and still grieving stress of breakup.  He requires frequent follow-up because of long-term symptoms.  At visit October 25, 2018.  We made several medicine changes because of his concerns about sleep and other issues.  We increased olanzapine back to 7.5 mg bc not sleeping as well at 5 mg.  He has to have the prescription for quetiapine written for up to 3 tablets at night in case insomnia is worse because insomnia makes his mood disorder so much worse.  We discussed that was above the usual max but the request was granted given his treatment resistant status. We also reduce lithium from 900 mg daily to 750 mg daily to try to reduce tremor and muscle twitches.  At  visit March 28, 2019.  Fluoxetine was increased to 40 mg daily.   Early January 2021 increased to 60 mg daily.  No SE.  seen June 28, 2019.  No further meds were changed except olanzapine was increased back to 10 mg daily to see if if he could get additional benefit per his request.  Covid vaccinated.  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  Then had cholecystectomy.  April 2021 appt with the following noted: 2 episodes night sweats lately.  Memory has been very bad lately.  Repeatedly asks mother  questions. Still  tend to stay in his room and his bed.  Still has rapid cycling mood swings.  Maybe some better with increase in olanzapine to 10 and tolerating itl.  Would like to get out more but can't DT Covid.  Can perform necessary chores.  Will get out of the house when he can.  No change in death thoughts and anxiety in intensity but is better with frequency.  Usually comes and goes in waves but more persistent.  Consistent with meds.  Ativan not helping anxiety very dramatically but he's not sure.  Fidgety.  No trigger other than still grieving relationship and can't get it out of his head.  Poor energy, concentration and more forgetful. In bed more and less active.  Sleep ok lately which is unusual.  Can concentrate on financial matters and stock market at times.  Tolerating meds.  Still some intrusive SI without reason. Less frequent obsessive thoughts about broken relationship and has been doing this for months.  Can't let it go.  Loop.   No unusual stress even with the family who is supportive. Likes the benefit that Zyprexa gives. No sleepwalking nor falling nor odd behavior.  No history of sleep walking.  He understands this may recur with an increase.  Also wants option to rarely take extra quetiapine 300 for sleep prn. Plan:  Try to reduce lorazepam if possible.  10/22/2019 appointment with the following noted:  Continues fluoxetine 60, lithium 600 mg, lorazepam 2 mg AM and HS and 1 mg midday, olanzapine 10, quetiapine 600 mg HS. Still anxious chronically and including driving in crowded spaces.  Doesn't thing Ativan helps as well as Xanax but less cognitive problems. Sleep is not as good.  Occ EFA.  More EMA and wanting to do things in the middle of the night but this is not typical.  Wonders about why that happens.  Some napping.   Tolerating meds.  Asks about weight loss meds.  Disc this in detail.   Plan no changes except OK meclizine prn vertigo.  02/11/20 appt with the following  noted: Able to gradually reduce lorazepam to 1 mg AM and HS. More depressed over time and less interested in things and less interest in going out but does with his [name]. Has reduced from 800 to 600 mg HS with some awakening but usually able to go back to sleep.[name]  Spending a good amount of time in bed bc watches TV in bedroom.  [name] watch TV in different part of the house.  No mood swings he notices.   Concerns about weight gain about 200#. Likes olanzapine's benefit for sleep. Plan: Trial for TRD to  Increase fluoxetine to 80 mg daily  to use the combo with olanzapine for TR bipolar depression.   04/21/20 appt with following noted: I thought in beginning some benefit with fluoxetine.  Lifelong negative thinking continues.   Not much bipolar.  Reads a lot on bipolar.   Still tired and anxious a little more may be seasonal.  No familial stressors.  Tends to lay down in afternoon.  Anxiety not over anything in particular.   No SE with fluoxetine. Took meclizine prn.  Easily motion sick.    Asks questions about newer drugs for bipolar like Caplyta. Plan:  No med changes  06/30/20 appt noted: Tired of wearing masks.  Vaccinated.  Asked questions about when this will end with mask mandate.  Mood up and down some since here and getting up 2-3 times.  Disc awakening problems.  Trying to do better bc night eating some.   Mood is about average today so far.  Martin Majestic out with friends for breakfast.  Recognizes activity helps mood.   3 days in a row napped a lot in the afternoon.   Bed is a comfort place.  Gets bored and hard to motivate.  Tries to stay away from night eating and spending.   More depressed when alone and wonders about med changes. Plan: Failed response to olanzapine + fluoxetine for TR bipolar depression.   Reduce fluoxetine to 1 daily and reduce olanzapine to one half nightly for 10 days and then stop it. Then start [place] 1 daily  08/27/2020 appointment with the following  noted: Patient decompensated with the above switch to [place] with intrusive suicidal thoughts and had to be hospitalized for psychiatric reasons.  Multiple phone calls with family members since that time.  Spoke with the psychiatric nurse practitioner with the decision to restart olanzapine in place of the [facility] given the patient was more stable while on the combination of Seroquel and olanzapine than with the [place]. Caplyta triggered HI/SI and depressed and confused on it, and agitated and still has some of it now. No akathisia. Frustrated with lack of therapy at the hospital.   Hydroxyzine didn't help jitteriness.  Feels some better.  Fleeting SI & HI but not obsessive like it was prior to hospitalization.  Feels a little amped up and nervous.  Scared of what could happen.  Apprehensive being here talking about things. 5-6 hours sleep last night and would like to have more. At mom and dad's house right now and up more in the day. Inadvertently stopped lorazepam abruptly by mistake likely contributing to shakiness. Still some memory issues.  Trying to stay out of bed when watching TV.  Some awakening but managing. Occ fleeting SI and distracts himself.   Always spends a lot of time just laying around.   Can have anxiety for no reason like coming here, and varies in intensity without pattern.      Less panic than in the past.  Some chronic depression and hyperactivity and loudness and hyperverbal.  Usually back to sleep.  Total 6 hours but some napping, awakens 2-4 times nightly but back to sleep..  Taking quetiapine 600 mg HS.  still lacks interest and motivation.  Can follow a TV show if interested. Plan: Restart lorazepam 1 mg in the morning, 1 mg in the afternoon and 1 mg at night for anxiety Stop hydroxyzine  Increase olanzapine to 1 and 1/2 of 10 mg tablets in evening  09/05/2020 appt noted: seen with [name] today at his request Made med changes noted and tolerated the changes Better than I  did last week. Now only fleeting HI/SI and "no where near what it was".  Sleeping better.  Still not a lot of energy.  Anhedonia.  Less agitation.  A lot of anxiety all the time but it's helping.  Would like more energy and motivation.   Doesn't handle stress well. [name] note he's less shakey.  He's still staying with his [name].  Only driven once since this happened and F wants him to be able to drive and function more independently.  M agrees he's better.  Memory is better but not normal.  Primarily STM problems No akathisia with olanzapine this time.    Administering his own meds but mo watched. Slept 8 hours last night. Plan consider further reductions in quetiapine  10/17/20 appt noted: Stayed on Seroquel 600 HS and olanzapine 15. Olanzapine seemed to do most for the sleep.  Also helping eliminated HI/SI for the most part.  Wants to try to increase it.  Depression is much better with less rumination also.  Fears akathisia at higher dose as in the past. Also on fluoxetine 40.   Plan: Reduce Seroquel to 1 and 1/2 tablets at night Increase olanzapine to 1 of the 15 mg tablet and 1/2 of the 5 mg tablet for 1 week,  Then, if tolerated increase the olanzapine to 20 mg or 1 of the 15 mg tablets and 1 of the 5 mg tablets. If possible then also reduce quetiapine to 1 tablet at night.  11/05/2020 appointment with the following noted: Up to olanzapine 20 mg hs for 3 days.  Reduced Serooquel to 450 mg HS.  Didn't try to go lower. Had hiatal hernia and umbilical hernia surgery recently with no problem.   No akathisia so far. Sleep ok with meds so far changes.  No mood changes yet but overall likes the smoothness of olanzapine and helping sleep without knociing him out. Still some occ HI/SI, he thinks bc of anxiety generally.  Easily anxious. Plan: Continue olanzapine 20 mg nightly longer to give it time to help mood sx reduce quetiapine to 1 tablet at night to minimize polypharmacy and reduce risk of  akathisia.  01/07/21 appt noted: Able  to reduce quetiapine 300 mg HS ok with decent sleep but still wakes a lot.  No AM hangover.  Don't have a lot to do.  Can enjoy going out and doing things.  But doesn't do it longterm.  Family is doing good.  Father started year 86 at his job.   Tolerating the meds well.  May wake more with quetiapine reduction. Lost 10#. Wonders if could try other meds he didn't tolerate before bc now can tolerate olanzapine and couldn't tolerate it in the past DT akathisia. Mood never great but can swing into depresssion without reason.   Plan: Continue olanzapine 20 mg nightly longer to give it time to help mood sx reduce quetiapine to 1/2 of the 300 mg  tablet at night to minimize polypharmacy and reduce risk of akathisia.  02/02/2021 appointment with the following noted: Tried reduced quetiapine to 150 mg and after a week had withdrawal sx including jittery and SI and he increased to 250 mg daily.  Felt better after increasing to 300 mg daily and last week reduced to 250 mg daily.  After increase quetiapine felt better in a few days.  Seems more alert with less quetiapine but can't tolerate a quick withdrawal.   Some awakening.  Plan: Reduce quetiapine by 50 mg every 2-4 weeks.  03/09/2021 phone call: Pt called and said that he is weaning off the seroquel. He was down to 200 mg of the seoquel and he started having withdrawal symptoms and sucidal thoughts. He went back up to 250 mg because he knew he was good on that.    Pt stated he starting back taking 250 mg on Saturday due to the symptoms he was having.Now he is no longer having suicidal thoughts but still very jittery. MD: He may just need to taper slower than most people.  If he can tolerate the 250 mg Seroquel with the jitteriness it should get better over the next 1 to 2 weeks.  Then he can try going down again by 50 mg at a time.  He probably needs to go down by just 50 mg every 2 to 4 weeks and wait till he fully  adjusts to each reduction before he reduces again.  03/17/21 appt noted: Hernia surgery 2 weeks ago. Seroquel 250 mg for a month then 200 mg daily and had SI and increased again to 300 mg daily.  Would love to get off it.  Withdrawal triggers intrusive HI/SI but otherwise doesn't have them No SE. Sleep is OK but not enough REM sleep.  Satisfied with other meds.   No current HI/SI.  No paranoia.  Still has anxiety and taking Ativan every 6 hours.  It works but doesn't cure it. Chronic anxiety and ask about ketamine for chronic depression. Plan: Because he had WD reducing from 250 to 200 mg daily will have to go slowly. Reduce quetiapine by 25 mg every 2-4 weeks. Reduce Seroquel to 250 mg for 2 weeks, Then reduce to 225 mg for 2 weeks, Then reduce to 200 mg for 2 weeks, Then reduce to 175 mg for 2 weeks, Then reduce to 150 mg Also: Buspirone 30 mg tablets for anxiety Start 1/3 tablet twice daily for 1 week Then increase to two thirds twice daily for 1 week Then increase to 1 tablet twice daily  04/15/2021 appointment with the following noted: Reduced Seroquel to 200 mg HS and increased buspirone to 30 mg BID STM px and wants to try to reduce Ativan to  see if it is better.  But fears with drawal sx. [name] complain about his memory. Less HI and SI since here. Less depressed. Plan: Buspirone 30 mg tablets for anxiety Start 1/3 tablet twice daily for 1 week Then increase to two thirds twice daily for 1 week Then increase to 1 tablet twice daily Continue olanzapine 20 mg nightly longer to give it time to help mood sx Because he had WD reducing from 250 to 200 mg daily will have to go slowly. Reduce quetiapine by 25 mg every 2-4 weeks. Reduce Seroquel to 250 mg for 2 weeks, Then reduce to 225 mg for 2 weeks, Then reduce to 200 mg for 2 weeks, Then reduce to 175 mg for 2 weeks, Then reduce to 150 mg Trial taper lorazepam: In hopes of improving short-term memory  05/21/2021  appointment with the following noted: Gotten onto buspar 30 BID and down on lorazepam to 0.5 mg QID. Gotten down to Seroquel 200 mg HS A little restless every now and then but able to reduce lorazepam for the last weeks. Is too inactive and lays in bed too much watching TV.   Can' t tell effect of buspirone bc of other changes.  Sleep with awakening with 6-7 hours. Fleeting SI without trigger or plan or intent. Depressed more than anxious. Enjoys watching stocks and investing.  Psych med hx extensive including ECT and  risperidone, lithium 1200 SE, Zyprexa 20-15 akathisia, Latuda 80 which caused akathisia, Abilify, Vraylar, Rexulti, aripiprazole 20 mg with akathisia,  Seroquel 1000 mg,  InVega, Geodon, Saphris with side effects, symbyax, Fanapt NR .  Caplyta SE and markedly worse. lamotrigine 300 mg, Depakote 2000 mg, Tegretol,Trileptal and several of these in combinations, gabapentin, N-acetylcysteine, Nuedexta,    Belsomra with no response,  Lunesta no response, trazodone 200 mg, Xanax, clonazepam, lorazepam less sedation. clonidine, Viibryd 40 mg for 3 months with diarrhea, protriptyline with side effects, Trintellix 20 mg, Parnate 50 mg with no response,  imipramine, venlafaxine,  Emsam 12 mg for 2 months,  bupropion was side effects,  Lexapro 20 mg, sertraline, paroxetine, Deplin, fluoxetine 80 methylphenidate 60 mg,  Vyvanse, Concerta, strattera, , modafinil,  pramipexole,  amantadine , Patient prone to akathisia.  Review of Systems:  Review of Systems  Cardiovascular:  Negative for palpitations.  Gastrointestinal:  Negative for abdominal pain.  Musculoskeletal:  Positive for back pain.  Neurological:  Positive for tremors. Negative for dizziness, weakness, light-headedness and headaches.       Fidgety  Psychiatric/Behavioral:  Positive for decreased concentration. Negative for agitation, behavioral problems, confusion, dysphoric mood, hallucinations, self-injury, sleep  disturbance and suicidal ideas. The patient is nervous/anxious and is hyperactive.    Medications: I have reviewed the patient's current medications.  Current Outpatient Medications  Medication Sig Dispense Refill   acetaminophen (TYLENOL) 500 MG tablet Take 1,000 mg by mouth every 6 (six) hours as needed for moderate pain.     cloNIDine (CATAPRES) 0.1 MG tablet one half tablet at night for 3 nights, then 1/2 tablet twice daily for 3 nights, then one half in the morning and 1 tablet at night 45 tablet 1   fenofibrate (TRICOR) 145 MG tablet Take 145 mg by mouth daily.     fluticasone (FLONASE) 50 MCG/ACT nasal spray Place 1 spray into both nostrils daily as needed for allergies or rhinitis.     hydrocortisone (ANUSOL-HC) 2.5 % rectal cream Place 1 application rectally 3 (three) times daily as needed for hemorrhoids.  levothyroxine (SYNTHROID, LEVOTHROID) 75 MCG tablet Take 75 mcg by mouth daily before breakfast.     lisinopril (ZESTRIL) 5 MG tablet Take 5 mg by mouth daily.     lithium carbonate 300 MG capsule TAKE 2 CAPSULES BY MOUTH EVERY DAY 180 capsule 1   meclizine (ANTIVERT) 25 MG tablet Take 25 mg by mouth 3 (three) times daily as needed for dizziness.     OLANZapine (ZYPREXA) 20 MG tablet TAKE 1 TABLET BY MOUTH AT BEDTIME 30 tablet 0   polycarbophil (FIBERCON) 625 MG tablet Take 1,250 mg by mouth daily.     sildenafil (REVATIO) 20 MG tablet Take 5 tablets (100 mg total) by mouth daily as needed (ED). 30 tablet 11   tamsulosin (FLOMAX) 0.4 MG CAPS capsule Take 1 capsule (0.4 mg total) by mouth daily. 90 capsule 3   busPIRone (BUSPAR) 30 MG tablet Take 1 tablet (30 mg total) by mouth 2 (two) times daily. 180 tablet 0   FLUoxetine (PROZAC) 40 MG capsule Take 1 capsule (40 mg total) by mouth daily. 90 capsule 0   LORazepam (ATIVAN) 0.5 MG tablet Take 1 tablet (0.5 mg total) by mouth 4 (four) times daily. 120 tablet 1   QUEtiapine (SEROQUEL) 200 MG tablet Take 1 tablet (200 mg total) by  mouth at bedtime. 90 tablet 0   No current facility-administered medications for this visit.   Medication Side Effects: Other: mild sleepiness.   Occ twitches.\, tremor  Allergies:  Allergies  Allergen Reactions   Meloxicam Other (See Comments)    Other reaction(s): Dizziness   Ibuprofen     Can not take because taking lithium   Prednisone     Can't sleep    Wellbutrin [Bupropion]     Suicide thoughts    Past Medical History:  Diagnosis Date   ADHD (attention deficit hyperactivity disorder)    Anemia    Anxiety    Bipolar disorder (HCC)    BPH (benign prostatic hyperplasia)    Chronic kidney disease, stage 3b (HCC)    DDD (degenerative disc disease), cervical    Depression    Deviated septum    ED (erectile dysfunction)    GERD (gastroesophageal reflux disease)    Graves disease    History of hiatal hernia    Hypertension    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    Hypothyroidism    Pneumonia    PONV (postoperative nausea and vomiting)     Family History  Problem Relation Age of Onset   Prostate cancer Neg Hx    Bladder Cancer Neg Hx    Kidney cancer Neg Hx     Social History   Socioeconomic History   Marital status: Single    Spouse name: Not on file   Number of children: Not on file   Years of education: Not on file   Highest education level: Not on file  Occupational History   Not on file  Tobacco Use   Smoking status: Former    Types: Cigarettes   Smokeless tobacco: Former    Types: Nurse, children's Use: Never used  Substance and Sexual Activity   Alcohol use: Not Currently   Drug use: Never   Sexual activity: Yes  Other Topics Concern   Not on file  Social History Narrative   Not on file   Social Determinants of Health   Financial Resource Strain: Not on file  Food Insecurity: Not on file  Transportation Needs: Not on file  Physical Activity: Not on file  Stress: Not on file  Social Connections: Not  on file  Intimate Partner Violence: Not on file    Past Medical History, Surgical history, Social history, and Family history were reviewed and updated as appropriate.   Please see review of systems for further details on the patient's review from today.   Objective:   Physical Exam:  BP 131/85    Pulse 95   Physical Exam Constitutional:      General: He is not in acute distress.    Appearance: He is well-developed.  Musculoskeletal:        General: No deformity.  Neurological:     Mental Status: He is alert and oriented to person, place, and time.     Cranial Nerves: No dysarthria.     Motor: Tremor present.     Coordination: Coordination normal.     Comments: Slight tremor  Psychiatric:        Attention and Perception: Attention and perception normal. He does not perceive auditory or visual hallucinations.        Mood and Affect: Mood is anxious and depressed. Affect is not labile, blunt, angry, tearful or inappropriate.        Speech: Speech normal. Speech is not slurred.        Behavior: Behavior normal. Behavior is not slowed. Behavior is cooperative.        Thought Content: Thought content is not paranoid or delusional. Thought content includes suicidal ideation. Thought content does not include homicidal ideation. Thought content does not include suicidal plan.        Cognition and Memory: Cognition normal. He exhibits impaired recent memory.        Judgment: Judgment normal.     Comments: Insight fair He is chronically anxious   But better than last visit No manic signs noted. Some wordfinding problems ongoing. No sui or homocidal intent or plan, but occ fleeting thoughts continue but are less Chronically urgent. Chronically down.    Lab Review:     Component Value Date/Time   NA 139 02/26/2021 1253   NA 142 03/20/2014 0514   K 4.0 02/26/2021 1253   K 4.1 03/20/2014 0514   CL 109 02/26/2021 1253   CL 112 (H) 03/20/2014 0514   CO2 22 02/26/2021 1253   CO2 26  03/20/2014 0514   GLUCOSE 83 02/26/2021 1253   GLUCOSE 99 03/20/2014 0514   BUN 13 02/26/2021 1253   BUN 7 03/20/2014 0514   CREATININE 1.56 (H) 02/26/2021 1253   CREATININE 0.78 03/20/2014 0514   CALCIUM 9.6 02/26/2021 1253   CALCIUM 9.5 03/20/2014 0514   PROT 7.8 08/11/2020 1750   PROT 7.3 03/18/2014 1459   ALBUMIN 4.7 08/11/2020 1750   ALBUMIN 3.2 (L) 03/18/2014 1459   AST 23 08/11/2020 1750   AST 16 03/18/2014 1459   ALT 16 08/11/2020 1750   ALT 16 03/18/2014 1459   ALKPHOS 34 (L) 08/11/2020 1750   ALKPHOS 105 03/18/2014 1459   BILITOT 0.5 08/11/2020 1750   BILITOT 0.3 03/18/2014 1459   GFRNONAA 51 (L) 02/26/2021 1253   GFRNONAA >60 03/20/2014 0514   GFRAA 54 (L) 04/18/2019 1612   GFRAA >60 03/20/2014 0514       Component Value Date/Time   WBC 6.2 02/26/2021 1253   RBC 4.17 (L) 02/26/2021 1253   HGB 13.3 02/26/2021 1253   HGB 12.6 (L) 03/20/2014 0514   HCT 39.4 02/26/2021  1253   HCT 38.4 (L) 03/20/2014 0514   PLT 431 (H) 02/26/2021 1253   PLT 346 03/20/2014 0514   MCV 94.5 02/26/2021 1253   MCV 89 03/20/2014 0514   MCH 31.9 02/26/2021 1253   MCHC 33.8 02/26/2021 1253   RDW 13.1 02/26/2021 1253   RDW 12.5 03/20/2014 0514   LYMPHSABS 3.2 03/20/2014 0514   MONOABS 1.0 03/20/2014 0514   EOSABS 0.4 03/20/2014 0514   BASOSABS 0.1 03/20/2014 0514    Lithium Lvl  Date Value Ref Range Status  08/11/2020 0.95 0.60 - 1.20 mmol/L Final    Comment:    Performed at [facility], [address] [place], [place] [address]    Last lithium level Sept 0.8.   Last lithium level July 27, 2018 was normal at 1.0.   Lithium level [facility] October 03, 2018 = 1.2. Said he got lithium level at [place] as requested.  Labs not in Epic.  Recent lipids ok except higher TG than usual.  Normal A1C.  No results found for: PHENYTOIN, PHENOBARB, VALPROATE, CBMZ   .res Assessment: Plan:    Albert [name] was seen today for follow-up, depression, anxiety and sleeping  problem.  Diagnoses and all orders for this visit:  Severe bipolar I disorder, current or most recent episode depressed (HCC) -     QUEtiapine (SEROQUEL) 200 MG tablet; Take 1 tablet (200 mg total) by mouth at bedtime. -     FLUoxetine (PROZAC) 40 MG capsule; Take 1 capsule (40 mg total) by mouth daily. -     cloNIDine (CATAPRES) 0.1 MG tablet; one half tablet at night for 3 nights, then 1/2 tablet twice daily for 3 nights, then one half in the morning and 1 tablet at night -     Lithium level  Generalized anxiety disorder -     QUEtiapine (SEROQUEL) 200 MG tablet; Take 1 tablet (200 mg total) by mouth at bedtime. -     LORazepam (ATIVAN) 0.5 MG tablet; Take 1 tablet (0.5 mg total) by mouth 4 (four) times daily. -     busPIRone (BUSPAR) 30 MG tablet; Take 1 tablet (30 mg total) by mouth 2 (two) times daily. -     cloNIDine (CATAPRES) 0.1 MG tablet; one half tablet at night for 3 nights, then 1/2 tablet twice daily for 3 nights, then one half in the morning and 1 tablet at night  Panic disorder with agoraphobia -     QUEtiapine (SEROQUEL) 200 MG tablet; Take 1 tablet (200 mg total) by mouth at bedtime. -     LORazepam (ATIVAN) 0.5 MG tablet; Take 1 tablet (0.5 mg total) by mouth 4 (four) times daily. 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  He usually has mixed bipolar symptoms which we have not been able to completely eliminate.  See long list of meds tried.   He is somewhat chronically unstable but better on olanzapine worse if reduces quetiapine quickly.  Discussed mother's concerns about his memory which is primarily a short-term memory issue.  We discussed the alternative options of using Namenda off label for mild cognitive impairment.  We  are not yet able to reduce the dose of lorazepam but that  could potentially help as well.  His memory was much worse on Xanax than lorazepam. We discussed the short-term risks associated with benzodiazepines including sedation and increased fall risk among others.  Discussed long-term side effect risk including dependence, potential withdrawal symptoms, and the potential eventual dose-related risk of dementia. Disc newer studies refute dementia risks.  Unfortunately due to the severity of set his symptoms polypharmacy is a necessity.  Disc unusual combo antipsychotics but it helps more than other options.  Discussed potential metabolic side effects associated with atypical antipsychotics, as well as potential risk for movement side effects. Advised pt to contact office if movement side effects occur.  He had some falling around the time when he previously took olanzapine but he took it for several months at this dosage before he had any side effect issues and he was on Xanax at the time that the following occurred. Take LED Seroquel to sleep.   No obvious alternatives for sleep  Lithium being used bc chronic SI and death thoughts.   Counseled patient regarding potential benefits, risks, and side effects of lithium to include potential risk of lithium affecting thyroid and renal function.  Discussed need for periodic lab monitoring to determine drug level and to assess for potential adverse effects.  Counseled patient regarding signs and symptoms of lithium toxicity and advised that they notify office immediately or seek urgent medical attention if experiencing these signs and symptoms.  Patient advised to contact office with any questions or concerns. Continue lithium 2 of the 300 mg capsules. Lithium level 0.8.  On 750 mg daily.   Call if death thoughts worsen or worsening SI.  Buspirone 30 mg 1 tablet twice daily  Previously he had difficulty tolerating 15 mg of olanzapine due to akathisia but he  is not having akathisia at this time on 20 mg daily.  Disc SE. Disc combo with Seroquel may lower response rate but he doesn't think he can sleep without Seroquel. Will try to go as low as possible if not off of it eventually.  Will go down again.    Continue olanzapine 20 mg nightly longer to give it time to help mood sx DT severity of sx and difficulty getting off Seroquel conside rincrease.  Disc WD  and may need to go lower before he can stop it. continue Seroquel 200 mg  Continue fluoxetine 40 mg 1 daily  Trial taper lorazepam gradually over time In hopes of improving short-term memory But for now continue lorazepam 0.5 mg QID  We discussed the short-term risks associated with benzodiazepines including sedation and increased fall risk among others.  Discussed long-term side effect risk including dependence, potential withdrawal symptoms, and the potential eventual dose-related risk of dementia.  But recent studies from 2020 dispute this association between benzodiazepines and dementia risk. Newer studies in 2020 do not support an association with dementia.  Discussed safety plan at length with patient.  Advised patient to contact office with any worsening signs and symptoms.  Instructed patient to go to the [facility] emergency room for evaluation if experiencing any acute safety concerns, to include suicidal intent.  Disc Ozempic and Monjauro for weight loss.  Has had weight gain from meds as a contributor. But lately has lost some weight.  Needs to keep up activity as much as possible for depression.  Plan: Continue lithium 2 capsules daily Continue lorazepam 0.5 mg tablets 1 tablet 4 times a day Continue buspirone 1 tablet twice daily Continue olanzapine 20 mg tablet  1 at night Continue Seroquel (quetiapine) 200 mg tablet 1 at night Continue fluoxetine 40 mg 1 daily Start clonidine 0.1 mg tablets one half at night for 3 nights, then 1/2 tablet twice daily for 3 nights, then one  half in the morning and 1 tablet at night  If clonidine does not noticeably help anxiety call the office  This appt was 30 mins.  FU 4 weeks   Albert Parents, MD, DFAPA  Future Appointments  Date Time Provider [place]  06/23/2021  1:30 PM Cottle, Billey Co., MD [facility] None  12/30/2021  2:45 PM Billey Co, MD [facility] None    Orders Placed This Encounter  Procedures   Lithium level       -------------------------------

## 2021-05-27 ENCOUNTER — Telehealth: Payer: Self-pay | Admitting: Psychiatry

## 2021-05-27 NOTE — Telephone Encounter (Signed)
Pt called reporting started Clonidine 1 mg 1/19 and last night he started having suicidal/homicidal thoughts. Took med 1/24 morning. Did not take any after.Contact pt @ [phone] asap.

## 2021-05-27 NOTE — Telephone Encounter (Signed)
Has a very unusual reaction to clonidine but if he feels that is causing those symptoms of homicidal and suicidal thoughts he obviously should not take it any further.

## 2021-05-27 NOTE — Telephone Encounter (Signed)
Pt informed

## 2021-05-27 NOTE — Telephone Encounter (Signed)
Pt stated he has been taking 1 in the morning and 1 at night since last Thursday and he has been fine up until yesterday.He took 1 in the morning and started feeling funny,then later started having severe suicidal and homicidal thoughts that he was able to get past,but he does not think he should continue med.

## 2021-05-28 NOTE — Telephone Encounter (Signed)
The patient only started clonidine at half a tablet on his appointment on January 19.  He called a couple days later saying that he was having suicidal thoughts and homicidal thoughts as a side effect.  He was instructed to stop the clonidine.  Now he is complaining of suicidal thoughts he thinks from withdrawal.  That is not correct.  He has not been on the medicine long enough to have withdrawal.  Because he thought it was causing homicidal and suicidal thoughts to take it, though this is extremely unlikely, he needs to just stay off the medication and let his body readjust.  He is not having withdrawal.

## 2021-05-29 NOTE — Telephone Encounter (Signed)
Pt called back and we discussed his medication. He does agree it wasn't withdrawal that just all he could think of at the time. He does feel better today. I advised him if his thoughts get too intense and he can't keep himself safe then he needs to get assessed at hospital or mental health. He verbalized understanding.

## 2021-05-29 NOTE — Telephone Encounter (Signed)
LM to call back.

## 2021-06-01 ENCOUNTER — Other Ambulatory Visit: Payer: Self-pay | Admitting: Psychiatry

## 2021-06-01 DIAGNOSIS — F4001 Agoraphobia with panic disorder: Secondary | ICD-10-CM

## 2021-06-01 DIAGNOSIS — F411 Generalized anxiety disorder: Secondary | ICD-10-CM

## 2021-06-01 NOTE — Telephone Encounter (Signed)
When we spoke earlier today he did not inform me that he was taking 8 daily.When I called back he stated when he was having suicidal thoughts from clonidine he started taking 2 tabs 4 times a day and now he is almost out.I explained to him that he can't increase on his own and he knows but felt like he needed to at the time.

## 2021-06-01 NOTE — Telephone Encounter (Signed)
The one on file is.5 mg. He is taking 1mg  now and will run out and needs a new script for the 1 mg of ativan

## 2021-06-01 NOTE — Telephone Encounter (Signed)
He called and said that he needs a new script of ativan 1 mg four times a day. [facility] in [place]

## 2021-06-01 NOTE — Telephone Encounter (Signed)
He just picked up a prescription for an 150 tablets of lorazepam 0.5 mg a couple of weeks ago.  The instructions are 1 tablet 4 times a day.  He should not need a prescription for 1 mg 4 times a day.  We have been attempting to reduce it.    Call and find out exactly how much he has been taking.  If he is taking more than 5 a day of the 0.5 mg tablets instruct him he cannot increase the dose on his own.  There is no note to indicate a dosage increase.  He should not need a refill now.

## 2021-06-01 NOTE — Telephone Encounter (Signed)
Rx on file.

## 2021-06-02 NOTE — Telephone Encounter (Signed)
He has 75 left of 0.5 mg lorazepam left.  Will not send in RX now.  Tell him to cut the dose back to lorazepam 0.5 mg QID now.    I don't believe clonidine made him agitated or caused the HI/SI bc that continued off the clonidine and that is not a side effect of clonidine.  He didn't give the clonidine an adequate trial.  If the anxiety gets worse after reducing the lorazepam to 1 tablet 4 times a day then he needs to add the clonidine back and try it again.  He may not have taken a high enough dosage to see the benefit.  It should work right away.

## 2021-06-03 NOTE — Telephone Encounter (Signed)
I received a PA request for his Lorazepam 0.5 mg, not sure what is going on here but do I need to send it? It's a quantity issue

## 2021-06-03 NOTE — Telephone Encounter (Signed)
Here's the message.  Please call him. He has 75 left of 0.5 mg lorazepam left.  Will not send in RX now.  Tell him to cut the dose back to lorazepam 0.5 mg QID now.     I don't believe clonidine made him agitated or caused the HI/SI bc that continued off the clonidine and that is not a side effect of clonidine.  He didn't give the clonidine an adequate trial.  If the anxiety gets worse after reducing the lorazepam to 1 tablet 4 times a day then he needs to add the clonidine back and try it again.  He may not have taken a high enough dosage to see the benefit.  It should work right away.      Note

## 2021-06-03 NOTE — Telephone Encounter (Signed)
No don't do the PA.  I'm not sending lorazepam now.  He has about 70 left.  He's obsessing over it

## 2021-06-13 ENCOUNTER — Other Ambulatory Visit: Payer: Self-pay | Admitting: Psychiatry

## 2021-06-13 DIAGNOSIS — F411 Generalized anxiety disorder: Secondary | ICD-10-CM

## 2021-06-13 DIAGNOSIS — F314 Bipolar disorder, current episode depressed, severe, without psychotic features: Secondary | ICD-10-CM

## 2021-06-13 DIAGNOSIS — F4001 Agoraphobia with panic disorder: Secondary | ICD-10-CM

## 2021-06-23 ENCOUNTER — Other Ambulatory Visit: Payer: Self-pay

## 2021-06-23 ENCOUNTER — Ambulatory Visit: Payer: Medicare PPO | Admitting: Psychiatry

## 2021-06-23 ENCOUNTER — Encounter: Payer: Self-pay | Admitting: Psychiatry

## 2021-06-23 DIAGNOSIS — F5105 Insomnia due to other mental disorder: Secondary | ICD-10-CM | POA: Diagnosis not present

## 2021-06-23 DIAGNOSIS — F4001 Agoraphobia with panic disorder: Secondary | ICD-10-CM | POA: Diagnosis not present

## 2021-06-23 DIAGNOSIS — Z79899 Other long term (current) drug therapy: Secondary | ICD-10-CM

## 2021-06-23 DIAGNOSIS — F411 Generalized anxiety disorder: Secondary | ICD-10-CM | POA: Diagnosis not present

## 2021-06-23 DIAGNOSIS — F314 Bipolar disorder, current episode depressed, severe, without psychotic features: Secondary | ICD-10-CM | POA: Diagnosis not present

## 2021-06-23 MED ORDER — CLONIDINE HCL 0.1 MG PO TABS: ORAL_TABLET | ORAL | 0 refills | Status: DC

## 2021-06-23 MED ORDER — LORAZEPAM 0.5 MG PO TABS: 0.5000 mg | ORAL_TABLET | Freq: Four times a day (QID) | ORAL | 1 refills | Status: DC

## 2021-06-23 NOTE — Progress Notes (Signed)
Albert [name] [id number] 1965-05-13 56 y.o.    Subjective:   Patient ID:  Albert [name] is a 56 y.o. (DOB 01-27-1966) male.  Chief Complaint:  Chief Complaint  Patient presents with   Follow-up    Severe bipolar I disorder, current or most recent episode depressed ([place])   Depression   Anxiety   Manic Behavior   Agitation    Depression        Associated symptoms include decreased concentration.  Associated symptoms include no headaches and no suicidal ideas.  Past medical history includes anxiety.   Anxiety Symptoms include decreased concentration and nervous/anxious behavior. Patient reports no confusion, dizziness, palpitations or suicidal ideas.     Albert [name] presents to the office today for follow-up of mixed bipolar, anxiety and still grieving stress of breakup.  He requires frequent follow-up because of long-term symptoms.  At visit October 25, 2018.  We made several medicine changes because of his concerns about sleep and other issues.  We increased olanzapine back to 7.5 mg bc not sleeping as well at 5 mg.  He has to have the prescription for quetiapine written for up to 3 tablets at night in case insomnia is worse because insomnia makes his mood disorder so much worse.  We discussed that was above the usual max but the request was granted given his treatment resistant status. We also reduce lithium from 900 mg daily to 750 mg daily to try to reduce tremor and muscle twitches.  At  visit March 28, 2019.  Fluoxetine was increased to 40 mg daily.   Early January 2021 increased to 60 mg daily.  No SE.  seen June 28, 2019.  No further meds were changed except olanzapine was increased back to 10 mg daily to see if if he could get additional benefit per his request.  Covid vaccinated.  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  Then had cholecystectomy.  April 2021 appt with the following noted: 2 episodes night sweats lately.  Memory has been very bad lately.  Repeatedly asks mother  questions. Still  tend to stay in his room and his bed.  Still has rapid cycling mood swings.  Maybe some better with increase in olanzapine to 10 and tolerating itl.  Would like to get out more but can't DT Covid.  Can perform necessary chores.  Will get out of the house when he can.  No change in death thoughts and anxiety in intensity but is better with frequency.  Usually comes and goes in waves but more persistent.  Consistent with meds.  Ativan not helping anxiety very dramatically but he's not sure.  Fidgety.  No trigger other than still grieving relationship and can't get it out of his head.  Poor energy, concentration and more forgetful. In bed more and less active.  Sleep ok lately which is unusual.  Can concentrate on financial matters and stock market at times.  Tolerating meds.  Still some intrusive SI without reason. Less frequent obsessive thoughts about broken relationship and has been doing this for months.  Can't let it go.  Loop.   No unusual stress even with the family who is supportive. Likes the benefit that Zyprexa gives. No sleepwalking nor falling nor odd behavior.  No history of sleep walking.  He understands this may recur with an increase.  Also wants option to rarely take extra quetiapine 300 for sleep prn. Plan:  Try to reduce lorazepam if possible.  10/22/2019 appointment with  the following noted: Continues fluoxetine 60, lithium 600 mg, lorazepam 2 mg AM and HS and 1 mg midday, olanzapine 10, quetiapine 600 mg HS. Still anxious chronically and including driving in crowded spaces.  Doesn't thing Ativan helps as well as Xanax but less cognitive problems. Sleep is not as good.  Occ EFA.  More EMA and wanting to do things in the middle of the night but this is not typical.  Wonders about why that happens.  Some napping.   Tolerating meds.  Asks about weight loss meds.  Disc this in detail.   Plan no changes except OK meclizine prn vertigo.  02/11/20 appt with the following  noted: Able to gradually reduce lorazepam to 1 mg AM and HS. More depressed over time and less interested in things and less interest in going out but does with his parents. Has reduced from 800 to 600 mg HS with some awakening but usually able to go back to sleep.[name]  Spending a good amount of time in bed bc watches TV in bedroom.  Parents watch TV in different part of the house.  No mood swings he notices.   Concerns about weight gain about 200#. Likes olanzapine's benefit for sleep. Plan: Trial for TRD to  Increase fluoxetine to 80 mg daily  to use the combo with olanzapine for TR bipolar depression.   04/21/20 appt with following noted: I thought in beginning some benefit with fluoxetine.  Lifelong negative thinking continues.   Not much bipolar.  Reads a lot on bipolar.   Still tired and anxious a little more may be seasonal.  No familial stressors.  Tends to lay down in afternoon.  Anxiety not over anything in particular.   No SE with fluoxetine. Took meclizine prn.  Easily motion sick.    Asks questions about newer drugs for bipolar like Caplyta. Plan:  No med changes  06/30/20 appt noted: Tired of wearing masks.  Vaccinated.  Asked questions about when this will end with mask mandate.  Mood up and down some since here and getting up 2-3 times.  Disc awakening problems.  Trying to do better bc night eating some.   Mood is about average today so far.  Albert [name] out with friends for breakfast.  Recognizes activity helps mood.   3 days in a row napped a lot in the afternoon.   Bed is a comfort place.  Gets bored and hard to motivate.  Tries to stay away from night eating and spending.   More depressed when alone and wonders about med changes. Plan: Failed response to olanzapine + fluoxetine for TR bipolar depression.   Reduce fluoxetine to 1 daily and reduce olanzapine to one half nightly for 10 days and then stop it. Then start [place] 1 daily  08/27/2020 appointment with the following  noted: Patient decompensated with the above switch to [place] with intrusive suicidal thoughts and had to be hospitalized for psychiatric reasons.  Multiple phone calls with family members since that time.  Spoke with the psychiatric nurse practitioner with the decision to restart olanzapine in place of the [place] given the patient was more stable while on the combination of Seroquel and olanzapine than with the [place]. Caplyta triggered HI/SI and depressed and confused on it, and agitated and still has some of it now. No akathisia. Frustrated with lack of therapy at the hospital.   Hydroxyzine didn't help jitteriness.  Feels some better.  Fleeting SI & HI but not obsessive like it was prior  to hospitalization.  Feels a little amped up and nervous.  Scared of what could happen.  Apprehensive being here talking about things. 5-6 hours sleep last night and would like to have more. At mom and dad's house right now and up more in the day. Inadvertently stopped lorazepam abruptly by mistake likely contributing to shakiness. Still some memory issues.  Trying to stay out of bed when watching TV.  Some awakening but managing. Occ fleeting SI and distracts himself.   Always spends a lot of time just laying around.   Can have anxiety for no reason like coming here, and varies in intensity without pattern.      Less panic than in the past.  Some chronic depression and hyperactivity and loudness and hyperverbal.  Usually back to sleep.  Total 6 hours but some napping, awakens 2-4 times nightly but back to sleep..  Taking quetiapine 600 mg HS.  still lacks interest and motivation.  Can follow a TV show if interested. Plan: Restart lorazepam 1 mg in the morning, 1 mg in the afternoon and 1 mg at night for anxiety Stop hydroxyzine  Increase olanzapine to 1 and 1/2 of 10 mg tablets in evening  09/05/2020 appt noted: seen with parents today at his request Made med changes noted and tolerated the changes Better than I  did last week. Now only fleeting HI/SI and "no where near what it was".  Sleeping better.  Still not a lot of energy.  Anhedonia.  Less agitation.  A lot of anxiety all the time but it's helping.  Would like more energy and motivation.   Doesn't handle stress well. Parents note he's less shakey.  He's still staying with his parents.  Only driven once since this happened and F wants him to be able to drive and function more independently.  M agrees he's better.  Memory is better but not normal.  Primarily STM problems No akathisia with olanzapine this time.    Administering his own meds but mo watched. Slept 8 hours last night. Plan consider further reductions in quetiapine  10/17/20 appt noted: Stayed on Seroquel 600 HS and olanzapine 15. Olanzapine seemed to do most for the sleep.  Also helping eliminated HI/SI for the most part.  Wants to try to increase it.  Depression is much better with less rumination also.  Fears akathisia at higher dose as in the past. Also on fluoxetine 40.   Plan: Reduce Seroquel to 1 and 1/2 tablets at night Increase olanzapine to 1 of the 15 mg tablet and 1/2 of the 5 mg tablet for 1 week,  Then, if tolerated increase the olanzapine to 20 mg or 1 of the 15 mg tablets and 1 of the 5 mg tablets. If possible then also reduce quetiapine to 1 tablet at night.  11/05/2020 appointment with the following noted: Up to olanzapine 20 mg hs for 3 days.  Reduced Serooquel to 450 mg HS.  Didn't try to go lower. Had hiatal hernia and umbilical hernia surgery recently with no problem.   No akathisia so far. Sleep ok with meds so far changes.  No mood changes yet but overall likes the smoothness of olanzapine and helping sleep without knociing him out. Still some occ HI/SI, he thinks bc of anxiety generally.  Easily anxious. Plan: Continue olanzapine 20 mg nightly longer to give it time to help mood sx reduce quetiapine to 1 tablet at night to minimize polypharmacy and reduce risk of  akathisia.  01/07/21  appt noted: Able to reduce quetiapine 300 mg HS ok with decent sleep but still wakes a lot.  No AM hangover.  Don't have a lot to do.  Can enjoy going out and doing things.  But doesn't do it longterm.  Family is doing good.  Father started year 68 at his job.   Tolerating the meds well.  May wake more with quetiapine reduction. Lost 10#. Wonders if could try other meds he didn't tolerate before bc now can tolerate olanzapine and couldn't tolerate it in the past DT akathisia. Mood never great but can swing into depresssion without reason.   Plan: Continue olanzapine 20 mg nightly longer to give it time to help mood sx reduce quetiapine to 1/2 of the 300 mg  tablet at night to minimize polypharmacy and reduce risk of akathisia.  02/02/2021 appointment with the following noted: Tried reduced quetiapine to 150 mg and after a week had withdrawal sx including jittery and SI and he increased to 250 mg daily.  Felt better after increasing to 300 mg daily and last week reduced to 250 mg daily.  After increase quetiapine felt better in a few days.  Seems more alert with less quetiapine but can't tolerate a quick withdrawal.   Some awakening.  Plan: Reduce quetiapine by 50 mg every 2-4 weeks.  03/09/2021 phone call: Pt called and said that he is weaning off the seroquel. He was down to 200 mg of the seoquel and he started having withdrawal symptoms and sucidal thoughts. He went back up to 250 mg because he knew he was good on that.    Pt stated he starting back taking 250 mg on Saturday due to the symptoms he was having.Now he is no longer having suicidal thoughts but still very jittery. MD: He may just need to taper slower than most people.  If he can tolerate the 250 mg Seroquel with the jitteriness it should get better over the next 1 to 2 weeks.  Then he can try going down again by 50 mg at a time.  He probably needs to go down by just 50 mg every 2 to 4 weeks and wait till he fully  adjusts to each reduction before he reduces again.  03/17/21 appt noted: Hernia surgery 2 weeks ago. Seroquel 250 mg for a month then 200 mg daily and had SI and increased again to 300 mg daily.  Would love to get off it.  Withdrawal triggers intrusive HI/SI but otherwise doesn't have them No SE. Sleep is OK but not enough REM sleep.  Satisfied with other meds.   No current HI/SI.  No paranoia.  Still has anxiety and taking Ativan every 6 hours.  It works but doesn't cure it. Chronic anxiety and ask about ketamine for chronic depression. Plan: Because he had WD reducing from 250 to 200 mg daily will have to go slowly. Reduce quetiapine by 25 mg every 2-4 weeks. Reduce Seroquel to 250 mg for 2 weeks, Then reduce to 225 mg for 2 weeks, Then reduce to 200 mg for 2 weeks, Then reduce to 175 mg for 2 weeks, Then reduce to 150 mg Also: Buspirone 30 mg tablets for anxiety Start 1/3 tablet twice daily for 1 week Then increase to two thirds twice daily for 1 week Then increase to 1 tablet twice daily  04/15/2021 appointment with the following noted: Reduced Seroquel to 200 mg HS and increased buspirone to 30 mg BID STM px and wants to try to  reduce Ativan to see if it is better.  But fears with drawal sx. Parents complain about his memory. Less HI and SI since here. Less depressed. Plan: Buspirone 30 mg tablets for anxiety Start 1/3 tablet twice daily for 1 week Then increase to two thirds twice daily for 1 week Then increase to 1 tablet twice daily Continue olanzapine 20 mg nightly longer to give it time to help mood sx Because he had WD reducing from 250 to 200 mg daily will have to go slowly. Reduce quetiapine by 25 mg every 2-4 weeks. Reduce Seroquel to 250 mg for 2 weeks, Then reduce to 225 mg for 2 weeks, Then reduce to 200 mg for 2 weeks, Then reduce to 175 mg for 2 weeks, Then reduce to 150 mg Trial taper lorazepam: In hopes of improving short-term memory  04/30/2022 phone  call: Complaining of withdrawal symptoms reducing lorazepam from 0.5 mg 5 times a day to 0.5 mg 4 times daily.  05/21/2021 appointment with the following noted: Gotten onto buspar 30 BID and down on lorazepam to 0.5 mg QID. Gotten down to Seroquel 200 mg HS A little restless every now and then but able to reduce lorazepam for the last weeks. Is too inactive and lays in bed too much watching TV.   Can' t tell effect of buspirone bc of other changes.  Sleep with awakening with 6-7 hours. Fleeting SI without trigger or plan or intent. Depressed more than anxious. Enjoys watching stocks and investing. Plan: Trial taper lorazepam gradually over time In hopes of improving short-term memory But for now continue lorazepam 0.5 mg QID Start clonidine 0.1 mg tablets one half at night for 3 nights, then 1/2 tablet twice daily for 3 nights, then one half in the morning and 1 tablet at night If clonidine does not noticeably help anxiety call the office  05/27/2021 phone call initially complaining of homicidal and suicidal thoughts after taking 1 mg of clonidine.  He was told to stop the medication.  But then called back stating he was having homicidal and suicidal thoughts from withdrawal from the medication which was not logical.  He agreed it was not logical and stated on 05/29/2021 that he felt better  06/23/2021 appointment the following noted: No clonidine. Sunday panic attack eating out and then having SI.  Stayed with father to feel safer. Intrusive thoughts of hurting parents or others.  HI only when has SI.  Usually panic without SI.  Then had intrusive thoughts about hurting others.   Usually intrusive thoughts are brief and fleeting.     Psych med hx extensive including ECT and  risperidone, lithium 1200 SE, Zyprexa 20-15 akathisia, Latuda 80 which caused akathisia, Abilify, Vraylar, Rexulti, aripiprazole 20 mg with akathisia,  Seroquel 1000 mg,  InVega, Geodon, Saphris with side effects,  symbyax, Fanapt NR .  Caplyta SE and markedly worse. lamotrigine 300 mg, Depakote 2000 mg, Tegretol,Trileptal and several of these in combinations, gabapentin, N-acetylcysteine, Nuedexta,    Belsomra with no response,  Lunesta no response, trazodone 200 mg, Xanax, clonazepam, lorazepam less sedation. clonidine, Buspirone NR Viibryd 40 mg for 3 months with diarrhea, protriptyline with side effects, Trintellix 20 mg, Parnate 50 mg with no response,  imipramine, venlafaxine,  Emsam 12 mg for 2 months,  bupropion was side effects,  Lexapro 20 mg, sertraline, paroxetine, Deplin, fluoxetine 80 methylphenidate 60 mg,  Vyvanse, Concerta, strattera, , modafinil,  pramipexole,  amantadine , Patient prone to akathisia.  Review of Systems:  Review of Systems  Cardiovascular:  Negative for palpitations.  Gastrointestinal:  Negative for abdominal pain.  Musculoskeletal:  Positive for back pain.  Neurological:  Positive for tremors. Negative for dizziness, weakness and headaches.       Fidgety  Psychiatric/Behavioral:  Positive for decreased concentration. Negative for agitation, behavioral problems, confusion, dysphoric mood, hallucinations, self-injury, sleep disturbance and suicidal ideas. The patient is nervous/anxious and is hyperactive.    Medications: I have reviewed the patient's current medications.  Current Outpatient Medications  Medication Sig Dispense Refill   acetaminophen (TYLENOL) 500 MG tablet Take 1,000 mg by mouth every 6 (six) hours as needed for moderate pain.     fenofibrate (TRICOR) 145 MG tablet Take 145 mg by mouth daily.     FLUoxetine (PROZAC) 40 MG capsule Take 1 capsule (40 mg total) by mouth daily. 90 capsule 0   fluticasone (FLONASE) 50 MCG/ACT nasal spray Place 1 spray into both nostrils daily as needed for allergies or rhinitis.     hydrocortisone (ANUSOL-HC) 2.5 % rectal cream Place 1 application rectally 3 (three) times daily as needed for hemorrhoids.      levothyroxine (SYNTHROID, LEVOTHROID) 75 MCG tablet Take 75 mcg by mouth daily before breakfast.     lisinopril (ZESTRIL) 5 MG tablet Take 5 mg by mouth daily.     lithium carbonate 300 MG capsule TAKE 2 CAPSULES BY MOUTH EVERY DAY 180 capsule 1   meclizine (ANTIVERT) 25 MG tablet Take 25 mg by mouth 3 (three) times daily as needed for dizziness.     OLANZapine (ZYPREXA) 20 MG tablet TAKE 1 TABLET BY MOUTH AT BEDTIME 30 tablet 0   polycarbophil (FIBERCON) 625 MG tablet Take 1,250 mg by mouth daily.     QUEtiapine (SEROQUEL) 200 MG tablet Take 1 tablet (200 mg total) by mouth at bedtime. 90 tablet 0   sildenafil (REVATIO) 20 MG tablet Take 5 tablets (100 mg total) by mouth daily as needed (ED). 30 tablet 11   tamsulosin (FLOMAX) 0.4 MG CAPS capsule Take 1 capsule (0.4 mg total) by mouth daily. 90 capsule 3   cloNIDine (CATAPRES) 0.1 MG tablet 1 twice daily for 3 days then 2 twice daily 120 tablet 0   LORazepam (ATIVAN) 0.5 MG tablet Take 1 tablet (0.5 mg total) by mouth 4 (four) times daily. 120 tablet 1   No current facility-administered medications for this visit.   Medication Side Effects: Other: mild sleepiness.   Occ twitches.\, tremor  Allergies:  Allergies  Allergen Reactions   Meloxicam Other (See Comments)    Other reaction(s): Dizziness   Ibuprofen     Can not take because taking lithium   Prednisone     Can't sleep    Wellbutrin [Bupropion]     Suicide thoughts    Past Medical History:  Diagnosis Date   ADHD (attention deficit hyperactivity disorder)    Anemia    Anxiety    Bipolar disorder (HCC)    BPH (benign prostatic hyperplasia)    Chronic kidney disease, stage 3b (HCC)    DDD (degenerative disc disease), cervical    Depression    Deviated septum    ED (erectile dysfunction)    GERD (gastroesophageal reflux disease)    Graves disease    History of hiatal hernia    Hypertension    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    Hypothyroidism     Pneumonia    PONV (postoperative nausea and vomiting)  Family History  Problem Relation Age of Onset   Prostate cancer Neg Hx    Bladder Cancer Neg Hx    Kidney cancer Neg Hx     Social History   Socioeconomic History   Marital status: Single    Spouse name: Not on file   Number of children: Not on file   Years of education: Not on file   Highest education level: Not on file  Occupational History   Not on file  Tobacco Use   Smoking status: Former    Types: Cigarettes   Smokeless tobacco: Former    Types: Nurse, children's Use: Never used  Substance and Sexual Activity   Alcohol use: Not Currently   Drug use: Never   Sexual activity: Yes  Other Topics Concern   Not on file  Social History Narrative   Not on file   Social Determinants of Health   Financial Resource Strain: Not on file  Food Insecurity: Not on file  Transportation Needs: Not on file  Physical Activity: Not on file  Stress: Not on file  Social Connections: Not on file  Intimate Partner Violence: Not on file    Past Medical History, Surgical history, Social history, and Family history were reviewed and updated as appropriate.   Please see review of systems for further details on the patient's review from today.   Objective:   Physical Exam:  There were no vitals taken for this visit.  Physical Exam Constitutional:      General: He is not in acute distress.    Appearance: He is well-developed.  Musculoskeletal:        General: No deformity.  Neurological:     Mental Status: He is alert and oriented to person, place, and time.     Cranial Nerves: No dysarthria.     Motor: Tremor present.     Coordination: Coordination normal.     Comments: Slight tremor  Psychiatric:        Attention and Perception: Attention and perception normal. He does not perceive auditory or visual hallucinations.        Mood and Affect: Mood is anxious and depressed. Affect is not labile, blunt,  angry, tearful or inappropriate.        Speech: Speech normal. Speech is not slurred.        Behavior: Behavior normal. Behavior is not slowed. Behavior is cooperative.        Thought Content: Thought content is not paranoid or delusional. Thought content includes suicidal ideation. Thought content does not include homicidal ideation. Thought content does not include suicidal plan.        Cognition and Memory: Cognition normal. He exhibits impaired recent memory.        Judgment: Judgment normal.     Comments: Insight fair He is chronically anxious   But better than last visit No manic signs noted. Some wordfinding problems ongoing. Intrusive thoughts lately Chronically urgent. Chronically down.    Lab Review:     Component Value Date/Time   NA 139 02/26/2021 1253   NA 142 03/20/2014 0514   K 4.0 02/26/2021 1253   K 4.1 03/20/2014 0514   CL 109 02/26/2021 1253   CL 112 (H) 03/20/2014 0514   CO2 22 02/26/2021 1253   CO2 26 03/20/2014 0514   GLUCOSE 83 02/26/2021 1253   GLUCOSE 99 03/20/2014 0514   BUN 13 02/26/2021 1253   BUN 7 03/20/2014 0514   CREATININE  1.56 (H) 02/26/2021 1253   CREATININE 0.78 03/20/2014 0514   CALCIUM 9.6 02/26/2021 1253   CALCIUM 9.5 03/20/2014 0514   PROT 7.8 08/11/2020 1750   PROT 7.3 03/18/2014 1459   ALBUMIN 4.7 08/11/2020 1750   ALBUMIN 3.2 (L) 03/18/2014 1459   AST 23 08/11/2020 1750   AST 16 03/18/2014 1459   ALT 16 08/11/2020 1750   ALT 16 03/18/2014 1459   ALKPHOS 34 (L) 08/11/2020 1750   ALKPHOS 105 03/18/2014 1459   BILITOT 0.5 08/11/2020 1750   BILITOT 0.3 03/18/2014 1459   GFRNONAA 51 (L) 02/26/2021 1253   GFRNONAA >60 03/20/2014 0514   GFRAA 54 (L) 04/18/2019 1612   GFRAA >60 03/20/2014 0514       Component Value Date/Time   WBC 6.2 02/26/2021 1253   RBC 4.17 (L) 02/26/2021 1253   HGB 13.3 02/26/2021 1253   HGB 12.6 (L) 03/20/2014 0514   HCT 39.4 02/26/2021 1253   HCT 38.4 (L) 03/20/2014 0514   PLT 431 (H) 02/26/2021  1253   PLT 346 03/20/2014 0514   MCV 94.5 02/26/2021 1253   MCV 89 03/20/2014 0514   MCH 31.9 02/26/2021 1253   MCHC 33.8 02/26/2021 1253   RDW 13.1 02/26/2021 1253   RDW 12.5 03/20/2014 0514   LYMPHSABS 3.2 03/20/2014 0514   MONOABS 1.0 03/20/2014 0514   EOSABS 0.4 03/20/2014 0514   BASOSABS 0.1 03/20/2014 0514    Lithium Lvl  Date Value Ref Range Status  08/11/2020 0.95 0.60 - 1.20 mmol/L Final    Comment:    Performed at [facility], [address] [place], [place] [address]    Last lithium level Sept 0.8.   Last lithium level July 27, 2018 was normal at 1.0.   Lithium level [facility] October 03, 2018 = 1.2. Said he got lithium level at [facility] as requested.  Labs not in Epic.  Recent lipids ok except higher TG than usual.  Normal A1C.  No results found for: PHENYTOIN, PHENOBARB, VALPROATE, CBMZ   .res Assessment: Plan:    Albert [name] was seen today for follow-up, depression, anxiety, manic behavior and agitation.  Diagnoses and all orders for this visit:  Severe bipolar I disorder, current or most recent episode depressed (HCC) -     cloNIDine (CATAPRES) 0.1 MG tablet; 1 twice daily for 3 days then 2 twice daily  Generalized anxiety disorder -     cloNIDine (CATAPRES) 0.1 MG tablet; 1 twice daily for 3 days then 2 twice daily -     LORazepam (ATIVAN) 0.5 MG tablet; Take 1 tablet (0.5 mg total) by mouth 4 (four) times daily.  Panic disorder with agoraphobia -     cloNIDine (CATAPRES) 0.1 MG tablet; 1 twice daily for 3 days then 2 twice daily -     LORazepam (ATIVAN) 0.5 MG tablet; Take 1 tablet (0.5 mg total) by mouth 4 (four) times daily.  Insomnia due to mental condition  Lithium use    Chronic TR bipolar mixed and chronic anxiety.  He usually has mixed bipolar symptoms which we have not been able to completely eliminate.  See long list of meds tried.   He is somewhat chronically unstable but better on olanzapine worse if reduces quetiapine  quickly.  Discussed mother's concerns about his memory which is primarily a short-term memory issue.  We discussed the alternative options of using Namenda off label for mild cognitive impairment.  We are not yet able to reduce the dose of lorazepam  but that could potentially help as well.  His memory was much worse on Xanax than lorazepam. We discussed the short-term risks associated with benzodiazepines including sedation and increased fall risk among others.  Discussed long-term side effect risk including dependence, potential withdrawal symptoms, and the potential eventual dose-related risk of dementia. Disc newer studies refute dementia risks.  Unfortunately due to the severity of set his symptoms polypharmacy is a necessity.  Disc unusual combo antipsychotics but it helps more than other options.  Discussed potential metabolic side effects associated with atypical antipsychotics, as well as potential risk for movement side effects. Advised pt to contact office if movement side effects occur.  He had some falling around the time when he previously took olanzapine but he took it for several months at this dosage before he had any side effect issues and he was on Xanax at the time that the following occurred. Take LED Seroquel to sleep.   No obvious alternatives for sleep  Lithium being used bc chronic SI and death thoughts.   Counseled patient regarding potential benefits, risks, and side effects of lithium to include potential risk of lithium affecting thyroid and renal function.  Discussed need for periodic lab monitoring to determine drug level and to assess for potential adverse effects.  Counseled patient regarding signs and symptoms of lithium toxicity and advised that they notify office immediately or seek urgent medical attention if experiencing these signs and symptoms.  Patient advised to contact office with any questions or concerns. Continue lithium 2 of the 300 mg capsules. Lithium  level 0.8.  On 750 mg daily.   Call if death thoughts worsen or worsening SI.  Previously he had difficulty tolerating 15 mg of olanzapine due to akathisia but he is not having akathisia at this time on 20 mg daily.  Disc SE. Disc combo with Seroquel may lower response rate but he doesn't think he can sleep without Seroquel. Will try to go as low as possible if not off of it eventually.  Will go down again.    Continue olanzapine 20 mg nightly longer to give it time to help mood sx DT severity of sx and difficulty getting off Seroquel conside rincrease.  Disc WD  and may need to go lower before he can stop it. continue Seroquel 200 mg  Continue fluoxetine 40 mg 1 daily  But for now continue lorazepam 0.5 mg QID  We discussed the short-term risks associated with benzodiazepines including sedation and increased fall risk among others.  Discussed long-term side effect risk including dependence, potential withdrawal symptoms, and the potential eventual dose-related risk of dementia.  But recent studies from 2020 dispute this association between benzodiazepines and dementia risk. Newer studies in 2020 do not support an association with dementia.  Discussed safety plan at length with patient.  Advised patient to contact office with any worsening signs and symptoms.  Instructed patient to go to the [facility] emergency room for evaluation if experiencing any acute safety concerns, to include suicidal intent.  Disc Ozempic and Monjauro for weight loss.  Has had weight gain from meds as a contributor. But lately has lost some weight.  Needs to keep up activity as much as possible for depression.  Plan: Continue lithium 2 capsules daily Continue lorazepam 0.5 mg tablets 1 tablet 4 times a day Stopped busprione DT NR Continue olanzapine 20 mg tablet 1 at night Continue Seroquel (quetiapine) 200 mg tablet 1 at night Continue fluoxetine 40 mg 1 daily clonidine 0.1  mg tablets retrial Clonidine 0.1 mg  tablets 1 twice daily for 3 days then 2 twice daily  If clonidine does not noticeably help anxiety call the office  Check lithium level  This appt was 30 mins.  FU 4 weeks   Lynder Parents, MD, DFAPA  Future Appointments  Date Time Provider [place]  07/20/2021  1:30 PM Cottle, Billey Co., MD [facility] None  12/30/2021  2:45 PM Billey Co, MD [facility] None    No orders of the defined types were placed in this encounter.      -------------------------------

## 2021-06-23 NOTE — Patient Instructions (Addendum)
Clonidine 0.1 mg tablets 1 twice daily for 3 days then 2 twice daily

## 2021-06-30 ENCOUNTER — Telehealth: Payer: Self-pay | Admitting: Psychiatry

## 2021-06-30 NOTE — Telephone Encounter (Signed)
Pt called having issues with Clonidine again. Falling, getting up at night and can't think clearly. Contact # [phone]

## 2021-06-30 NOTE — Telephone Encounter (Signed)
Ok to have him reduce to 1/2 twice daily for 4 days and if SE are not better and there's no benefit at 1/2 tablet twice daily then stop it.

## 2021-06-30 NOTE — Telephone Encounter (Signed)
Pt no longer wants to take clonidine.He also stated yesterday he had trouble making sentences,writing,and he ran into a wall.He is taking 1 tab BID and wants to taper off.

## 2021-06-30 NOTE — Telephone Encounter (Signed)
Pt informed

## 2021-07-09 ENCOUNTER — Other Ambulatory Visit: Payer: Self-pay | Admitting: Psychiatry

## 2021-07-09 DIAGNOSIS — F314 Bipolar disorder, current episode depressed, severe, without psychotic features: Secondary | ICD-10-CM

## 2021-07-09 DIAGNOSIS — F4001 Agoraphobia with panic disorder: Secondary | ICD-10-CM

## 2021-07-09 DIAGNOSIS — F411 Generalized anxiety disorder: Secondary | ICD-10-CM

## 2021-07-09 NOTE — Telephone Encounter (Signed)
TC re elevated lithium level 07/08/21 =1.8 ? 06/01/21 05/29/19  ?Lithium Level 1.0   ? ?Taking 600 mg HS. ?No new meds.  No NSAIDS.  No prednisone. ?A little tremor and balance issues.  Sometimes trouble expressing himself. ?No NVD.  No trouble keeping fluids down.   ?No mor e tremor than normal.   ?Off clonidine Sat and finished ABX for sinusitis Sunday. ? ?M also on the phone:  she noticed a little memory issue and word formation issues but seems better in last few days. ? ?A/P:  minimal sx of toxicity with lithium.  Could be lab error. ?Reduce lithium to 300 mg HS ?Repeat lithium level next Wed. ? ?Meredith Staggers, MD, DFAPA ? ? ?

## 2021-07-10 ENCOUNTER — Telehealth: Payer: Self-pay

## 2021-07-10 LAB — LITHIUM LEVEL: Lithium Lvl: 1.8 mmol/L (ref 0.5–1.2)

## 2021-07-10 NOTE — Telephone Encounter (Signed)
Prior Authorization submitted and approved for LORAZEPAM 0.5 MG #120 effective 05/03/2021-05/02/2022 with [employer] PA# [id number] ?

## 2021-07-13 ENCOUNTER — Telehealth: Payer: Self-pay | Admitting: Psychiatry

## 2021-07-13 ENCOUNTER — Other Ambulatory Visit: Payer: Self-pay | Admitting: Psychiatry

## 2021-07-13 DIAGNOSIS — F314 Bipolar disorder, current episode depressed, severe, without psychotic features: Secondary | ICD-10-CM

## 2021-07-13 NOTE — Telephone Encounter (Signed)
faxed

## 2021-07-13 NOTE — Telephone Encounter (Signed)
Printed and sent.  Please fax it also to be sure he gets there ?

## 2021-07-13 NOTE — Telephone Encounter (Signed)
Please print so I can fax

## 2021-07-13 NOTE — Telephone Encounter (Signed)
Next visit is 07/20/21. Requesting his lithium level lab order be faxed to: ? ?[facility], [place] [address] [place], [place] [address]. Phone number is [phone]. ? ?He is going to tomorrow.  ?

## 2021-07-16 LAB — LITHIUM LEVEL: Lithium Lvl: 0.7 mmol/L (ref 0.5–1.2)

## 2021-07-16 NOTE — Progress Notes (Signed)
Let him know his lithium level is now normal again on 1 lithium 300 mg daily.  Level 0.7. ?I see he had seen his kidney doctor in January.  Ask him if he has any upcoming lab tests within the next month or 2 with any doctors.  If not I want to go ahead and order another basic metabolic panel to check his creatinine which is a measure of kidney function.  I do not want to wait more than 1 or 2 months.  If there are no lab scheduled that I want him to do it right away and I will order it.

## 2021-07-20 ENCOUNTER — Encounter: Payer: Self-pay | Admitting: Psychiatry

## 2021-07-20 ENCOUNTER — Telehealth: Payer: Self-pay | Admitting: Psychiatry

## 2021-07-20 ENCOUNTER — Other Ambulatory Visit: Payer: Self-pay

## 2021-07-20 ENCOUNTER — Ambulatory Visit (INDEPENDENT_AMBULATORY_CARE_PROVIDER_SITE_OTHER): Payer: Medicare PPO | Admitting: Psychiatry

## 2021-07-20 VITALS — BP 129/83 | HR 90

## 2021-07-20 DIAGNOSIS — F411 Generalized anxiety disorder: Secondary | ICD-10-CM | POA: Diagnosis not present

## 2021-07-20 DIAGNOSIS — Z79899 Other long term (current) drug therapy: Secondary | ICD-10-CM

## 2021-07-20 DIAGNOSIS — F5105 Insomnia due to other mental disorder: Secondary | ICD-10-CM

## 2021-07-20 DIAGNOSIS — F314 Bipolar disorder, current episode depressed, severe, without psychotic features: Secondary | ICD-10-CM | POA: Diagnosis not present

## 2021-07-20 DIAGNOSIS — F4001 Agoraphobia with panic disorder: Secondary | ICD-10-CM

## 2021-07-20 MED ORDER — OLANZAPINE 5 MG PO TABS: 5.0000 mg | ORAL_TABLET | Freq: Every day | ORAL | 1 refills | Status: DC

## 2021-07-20 MED ORDER — OLANZAPINE 20 MG PO TABS: 20.0000 mg | ORAL_TABLET | Freq: Every day | ORAL | 1 refills | Status: DC

## 2021-07-20 NOTE — Telephone Encounter (Signed)
Pt LVM at 10:24.  Dr Jennelle Human had decreased his lithium from 600mg  to 300mg .  He is starting to have jitters and wants to know if he can start taking the 600mg  again.  He knows he has an appt at 1:30 today but wants you to call him before that if possible. ? ? ?

## 2021-07-20 NOTE — Progress Notes (Signed)
Albert [name] ?PT:[id number] ?Apr 21, 1966 ?56 y.o.  ? ? ?Subjective:  ? ?Patient ID:  Albert [name] is a 56 y.o. (DOB [birth year]) male. ? ?Chief Complaint:  ?Chief Complaint  ?Patient presents with  ? Follow-up  ?  Severe bipolar I disorder, current or most recent episode depressed ([place])  ? Depression  ? Anxiety  ? Medication Problem  ? ? ?Depression ?       Associated symptoms include decreased concentration.  Associated symptoms include no headaches and no suicidal ideas.  Past medical history includes anxiety.   ?Anxiety ?Symptoms include decreased concentration and nervous/anxious behavior. Patient reports no confusion, dizziness, palpitations or suicidal ideas.  ? ?  ?Albert [name] presents to the office today for follow-up of mixed bipolar, anxiety and still grieving stress of breakup.  He requires frequent follow-up because of long-term symptoms. ? ?At visit October 25, 2018.  We made several medicine changes because of his concerns about sleep and other issues.  We increased olanzapine back to 7.5 mg bc not sleeping as well at 5 mg.  He has to have the prescription for quetiapine written for up to 3 tablets at night in case insomnia is worse because insomnia makes his mood disorder so much worse.  We discussed that was above the usual max but the request was granted given his treatment resistant status. ?We also reduce lithium from 900 mg daily to 750 mg daily to try to reduce tremor and muscle twitches. ? ?At  visit March 28, 2019.  Fluoxetine was increased to 40 mg daily.   ?Early January 2021 increased to 60 mg daily.  No SE. ? ?seen June 28, 2019.  No further meds were changed except olanzapine was increased back to 10 mg daily to see if if he could get additional benefit per his request. ? ?Covid vaccinated.  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  Then had cholecystectomy. ? ?April 2021 appt with the following noted: ?2 episodes night sweats lately.  Memory has been very bad lately.  Repeatedly asks mother  questions. ?Still  tend to stay in his room and his bed.  Still has rapid cycling mood swings.  Maybe some better with increase in olanzapine to 10 and tolerating itl.  Would like to get out more but can't DT Covid.  Can perform necessary chores.  Will get out of the house when he can.  No change in death thoughts and anxiety in intensity but is better with frequency.  Usually comes and goes in waves but more persistent.  Consistent with meds.  Ativan not helping anxiety very dramatically but he's not sure.  Fidgety.  No trigger other than still grieving relationship and can't get it out of his head.  Poor energy, concentration and more forgetful. In bed more and less active.  Sleep ok lately which is unusual.  Can concentrate on financial matters and stock market at times.  Tolerating meds.  Still some intrusive SI without reason. ?Less frequent obsessive thoughts about broken relationship and has been doing this for months.  Can't let it go.  Loop.   ?No unusual stress even with the family who is supportive. ?Likes the benefit that Zyprexa gives. ?No sleepwalking nor falling nor odd behavior.  No history of sleep walking.  He understands this may recur with an increase.  Also wants option to rarely take extra quetiapine 300 for sleep prn. ?Plan:  Try to reduce lorazepam if possible. ? ?10/22/2019 appointment with the following noted: ?  Continues fluoxetine 60, lithium 600 mg, lorazepam 2 mg AM and HS and 1 mg midday, olanzapine 10, quetiapine 600 mg HS. ?Still anxious chronically and including driving in crowded spaces.  Doesn't thing Ativan helps as well as Xanax but less cognitive problems. ?Sleep is not as good.  Occ EFA.  More EMA and wanting to do things in the middle of the night but this is not typical.  Wonders about why that happens.  Some napping.   ?Tolerating meds.  ?Asks about weight loss meds.  Disc this in detail.   ?Plan no changes except OK meclizine prn vertigo. ? ?02/11/20 appt with the following  noted: ?Able to gradually reduce lorazepam to 1 mg AM and HS. ?More depressed over time and less interested in things and less interest in going out but does with his parents. ?Has reduced from 800 to 600 mg HS with some awakening but usually able to go back to sleep.Albert [name]  Spending a good amount of time in bed bc watches TV in bedroom.  Parents watch TV in different part of the house.  No mood swings he notices.   ?Concerns about weight gain about 200#. ?Likes olanzapine's benefit for sleep. ?Plan: Trial for TRD to  Increase fluoxetine to 80 mg daily  to use the combo with olanzapine for TR bipolar depression.  ? ?04/21/20 appt with following noted: ?I thought in beginning some benefit with fluoxetine.  Lifelong negative thinking continues.   Not much bipolar.  ?Reads a lot on bipolar.   ?Still tired and anxious a little more may be seasonal.  No familial stressors.  Tends to lay down in afternoon.  Anxiety not over anything in particular.   ?No SE with fluoxetine. ?Took meclizine prn.  Easily motion sick.    ?Asks questions about newer drugs for bipolar like Caplyta. ?Plan:  No med changes ? ?06/30/20 appt noted: ?Tired of wearing masks.  Vaccinated.  Asked questions about when this will end with mask mandate.  ?Mood up and down some since here and getting up 2-3 times.  Disc awakening problems.  Trying to do better bc night eating some.   Mood is about average today so far.  Albert [name] out with friends for breakfast.  Recognizes activity helps mood.   ?3 days in a row napped a lot in the afternoon.   Bed is a comfort place.  Gets bored and hard to motivate.  Tries to stay away from night eating and spending.   ?More depressed when alone and wonders about med changes. ?Plan: Failed response to olanzapine + fluoxetine for TR bipolar depression.   ?Reduce fluoxetine to 1 daily and reduce olanzapine to one half nightly for 10 days and then stop it. ?Then start Caplyta 1 daily ? ?08/27/2020 appointment with the following  noted: ?Patient decompensated with the above switch to [place] with intrusive suicidal thoughts and had to be hospitalized for psychiatric reasons.  Multiple phone calls with family members since that time.  Spoke with the psychiatric nurse practitioner with the decision to restart olanzapine in place of the [place] given the patient was more stable while on the combination of Seroquel and olanzapine than with the [facility]. ?Caplyta triggered HI/SI and depressed and confused on it, and agitated and still has some of it now. ?No akathisia. ?Frustrated with lack of therapy at the hospital.   ?Hydroxyzine didn't help jitteriness.  ?Feels some better.  Fleeting SI & HI but not obsessive like it was prior to hospitalization.  Feels a little amped up and nervous.  Scared of what could happen.  Apprehensive being here talking about things. ?5-6 hours sleep last night and would like to have more. At mom and dad's house right now and up more in the day. ?Inadvertently stopped lorazepam abruptly by mistake likely contributing to shakiness. ?Still some memory issues.  Trying to stay out of bed when watching TV.  Some awakening but managing. Occ fleeting SI and distracts himself.   Always spends a lot of time just laying around.   Can have anxiety for no reason like coming here, and varies in intensity without pattern.      Less panic than in the past.  Some chronic depression and hyperactivity and loudness and hyperverbal.  Usually back to sleep.  Total 6 hours but some napping, awakens 2-4 times nightly but back to sleep..  Taking quetiapine 600 mg HS.  still lacks interest and motivation.  Can follow a TV show if interested. ?Plan: Restart lorazepam 1 mg in the morning, 1 mg in the afternoon and 1 mg at night for anxiety ?Stop hydroxyzine  ?Increase olanzapine to 1 and 1/2 of 10 mg tablets in evening ? ?09/05/2020 appt noted: seen with parents today at his request ?Made med changes noted and tolerated the changes ?Better than I  did last week. Now only fleeting HI/SI and "no where near what it was".  Sleeping better.  Still not a lot of energy.  Anhedonia.  Less agitation.  A lot of anxiety all the time but it's helping.  Would like more energy

## 2021-07-20 NOTE — Telephone Encounter (Signed)
Pt is having tremors he believes is from lithium decrease.Has an appt today

## 2021-07-20 NOTE — Patient Instructions (Addendum)
Clozapine option ?Continue lithium 300 daily. ?Increase olanzapine to 20 mg plus 5 mg nightly ?

## 2021-07-30 ENCOUNTER — Telehealth: Payer: Self-pay | Admitting: Psychiatry

## 2021-07-30 ENCOUNTER — Other Ambulatory Visit: Payer: Self-pay | Admitting: Psychiatry

## 2021-07-30 DIAGNOSIS — F314 Bipolar disorder, current episode depressed, severe, without psychotic features: Secondary | ICD-10-CM

## 2021-07-30 MED ORDER — LITHIUM CARBONATE ER 450 MG PO TBCR: 450.0000 mg | EXTENDED_RELEASE_TABLET | Freq: Every evening | ORAL | 0 refills | Status: DC

## 2021-07-30 NOTE — Telephone Encounter (Signed)
Pt called having suicidal thoughts all day. Can't shack them. Been out walking around trying to get off this mind. Not working. Return call ASAP ?[phone] ?

## 2021-07-30 NOTE — Telephone Encounter (Signed)
Call to check on him Friday.  I spoke with him Thursday ?

## 2021-07-30 NOTE — Telephone Encounter (Signed)
RTC ? ?CO racing SI today and wonders about whether increased sx racing thoughts, hyperactivity, SI related to Reduced lithium from 600 mg daily to 300 mg daily recently DT lithium level 1.8.  could be related.   ? ?Plan: increase olanzapine 30 mg pm ?Today take lithium 600 mg now. ? ?Tomorrow start lithium CR 450 mg daily ?Continue olanzapine 30 mg PM until SI resolves unless akathisia returns. ? ?Lynder Parents, MD, DFAPA ? ?

## 2021-07-30 NOTE — Telephone Encounter (Signed)
Pt reports he woke up this morning and felt okay but then he started having suicidal thoughts, he has tried to stay busy all day to help. He says they are still there and asking if Dr. Jennelle Human can add or change his medications. He reports tolerating the Zyprexa increase. He doesn't know if it's related to that. He was seen on 07/20/2021. ? ?Informed him I would let Dr. Jennelle Human know and call back.  ?

## 2021-07-30 NOTE — Telephone Encounter (Signed)
Rtc but no answer and left message for him to call me back. ?

## 2021-07-31 NOTE — Telephone Encounter (Signed)
Yes take 600 mg lithium until he can get the 450 mg size on Monday is fine ?

## 2021-07-31 NOTE — Telephone Encounter (Signed)
Patient notified

## 2021-07-31 NOTE — Telephone Encounter (Signed)
Patient said he was doing a little bit better, still having SI but not as frequently. He said his pharmacy did not have 450 mg of lithium and it would be Monday before they could get it. He wanted to know could he take 600 mg until then.  ?

## 2021-08-06 ENCOUNTER — Other Ambulatory Visit: Payer: Self-pay

## 2021-08-06 MED ORDER — OLANZAPINE 10 MG PO TABS: 10.0000 mg | ORAL_TABLET | Freq: Every day | ORAL | 1 refills | Status: DC

## 2021-08-13 ENCOUNTER — Other Ambulatory Visit: Payer: Self-pay | Admitting: Psychiatry

## 2021-08-13 LAB — LITHIUM LEVEL: Lithium Lvl: 0.9 mmol/L (ref 0.5–1.2)

## 2021-08-13 NOTE — Progress Notes (Signed)
Lithium level 0.9 on 450 mg nightly.  No change indicated.

## 2021-08-17 ENCOUNTER — Encounter: Payer: Self-pay | Admitting: Psychiatry

## 2021-08-17 ENCOUNTER — Ambulatory Visit (INDEPENDENT_AMBULATORY_CARE_PROVIDER_SITE_OTHER): Payer: Medicare PPO | Admitting: Psychiatry

## 2021-08-17 DIAGNOSIS — F5105 Insomnia due to other mental disorder: Secondary | ICD-10-CM

## 2021-08-17 DIAGNOSIS — Z79899 Other long term (current) drug therapy: Secondary | ICD-10-CM

## 2021-08-17 DIAGNOSIS — F411 Generalized anxiety disorder: Secondary | ICD-10-CM

## 2021-08-17 DIAGNOSIS — F314 Bipolar disorder, current episode depressed, severe, without psychotic features: Secondary | ICD-10-CM | POA: Diagnosis not present

## 2021-08-17 DIAGNOSIS — F4001 Agoraphobia with panic disorder: Secondary | ICD-10-CM | POA: Diagnosis not present

## 2021-08-17 DIAGNOSIS — R6889 Other general symptoms and signs: Secondary | ICD-10-CM

## 2021-08-17 MED ORDER — FLUOXETINE HCL 20 MG PO CAPS: 20.0000 mg | ORAL_CAPSULE | Freq: Every day | ORAL | 1 refills | Status: DC

## 2021-08-17 NOTE — Progress Notes (Signed)
Albert [name] ?[id number] ?08/19/1965 ?56 y.o.  ? ? ?Subjective:  ? ?Patient ID:  Albert [name] is a 56 y.o. (DOB Jun 10, 1965) male. ? ?Chief Complaint:  ?Chief Complaint  ?Patient presents with  ? Follow-up  ? Depression  ? Anxiety  ? Manic Behavior  ? ? ?Depression ?       Associated symptoms include decreased concentration.  Associated symptoms include no suicidal ideas.  Past medical history includes anxiety.   ?Anxiety ?Symptoms include decreased concentration and nervous/anxious behavior. Patient reports no confusion, dizziness, palpitations or suicidal ideas.  ? ?  ?Albert [name] presents to the office today for follow-up of mixed bipolar, anxiety and still grieving stress of breakup.  He requires frequent follow-up because of long-term symptoms. ? ?At visit October 25, 2018.  We made several medicine changes because of his concerns about sleep and other issues.  We increased olanzapine back to 7.5 mg bc not sleeping as well at 5 mg.  He has to have the prescription for quetiapine written for up to 3 tablets at night in case insomnia is worse because insomnia makes his mood disorder so much worse.  We discussed that was above the usual max but the request was granted given his treatment resistant status. ?We also reduce lithium from 900 mg daily to 750 mg daily to try to reduce tremor and muscle twitches. ? ?At  visit March 28, 2019.  Fluoxetine was increased to 40 mg daily.   ?Early January 2021 increased to 60 mg daily.  No SE. ? ?seen June 28, 2019.  No further meds were changed except olanzapine was increased back to 10 mg daily to see if if he could get additional benefit per his request. ? ?Covid vaccinated.  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  Then had cholecystectomy. ? ?April 2021 appt with the following noted: ?2 episodes night sweats lately.  Memory has been very bad lately.  Repeatedly asks mother questions. ?Still  tend to stay in his room and his bed.  Still has rapid cycling mood swings.  Maybe some  better with increase in olanzapine to 10 and tolerating itl.  Would like to get out more but can't DT Covid.  Can perform necessary chores.  Will get out of the house when he can.  No change in death thoughts and anxiety in intensity but is better with frequency.  Usually comes and goes in waves but more persistent.  Consistent with meds.  Ativan not helping anxiety very dramatically but he's not sure.  Fidgety.  No trigger other than still grieving relationship and can't get it out of his head.  Poor energy, concentration and more forgetful. In bed more and less active.  Sleep ok lately which is unusual.  Can concentrate on financial matters and stock market at times.  Tolerating meds.  Still some intrusive SI without reason. ?Less frequent obsessive thoughts about broken relationship and has been doing this for months.  Can't let it go.  Loop.   ?No unusual stress even with the family who is supportive. ?Likes the benefit that Zyprexa gives. ?No sleepwalking nor falling nor odd behavior.  No history of sleep walking.  He understands this may recur with an increase.  Also wants option to rarely take extra quetiapine 300 for sleep prn. ?Plan:  Try to reduce lorazepam if possible. ? ?10/22/2019 appointment with the following noted: ?Continues fluoxetine 60, lithium 600 mg, lorazepam 2 mg AM and HS and 1 mg midday, olanzapine  10, quetiapine 600 mg HS. ?Still anxious chronically and including driving in crowded spaces.  Doesn't thing Ativan helps as well as Xanax but less cognitive problems. ?Sleep is not as good.  Occ EFA.  More EMA and wanting to do things in the middle of the night but this is not typical.  Wonders about why that happens.  Some napping.   ?Tolerating meds.  ?Asks about weight loss meds.  Disc this in detail.   ?Plan no changes except OK meclizine prn vertigo. ? ?02/11/20 appt with the following noted: ?Able to gradually reduce lorazepam to 1 mg AM and HS. ?More depressed over time and less interested  in things and less interest in going out but does with his parents. ?Has reduced from 800 to 600 mg HS with some awakening but usually able to go back to sleep.[name]  Spending a good amount of time in bed bc watches TV in bedroom.  Parents watch TV in different part of the house.  No mood swings he notices.   ?Concerns about weight gain about 200#. ?Likes olanzapine's benefit for sleep. ?Plan: Trial for TRD to  Increase fluoxetine to 80 mg daily  to use the combo with olanzapine for TR bipolar depression.  ? ?04/21/20 appt with following noted: ?I thought in beginning some benefit with fluoxetine.  Lifelong negative thinking continues.   Not much bipolar.  ?Reads a lot on bipolar.   ?Still tired and anxious a little more may be seasonal.  No familial stressors.  Tends to lay down in afternoon.  Anxiety not over anything in particular.   ?No SE with fluoxetine. ?Took meclizine prn.  Easily motion sick.    ?Asks questions about newer drugs for bipolar like Caplyta. ?Plan:  No med changes ? ?06/30/20 appt noted: ?Tired of wearing masks.  Vaccinated.  Asked questions about when this will end with mask mandate.  ?Mood up and down some since here and getting up 2-3 times.  Disc awakening problems.  Trying to do better bc night eating some.   Mood is about average today so far.  Albert [name] out with friends for breakfast.  Recognizes activity helps mood.   ?3 days in a row napped a lot in the afternoon.   Bed is a comfort place.  Gets bored and hard to motivate.  Tries to stay away from night eating and spending.   ?More depressed when alone and wonders about med changes. ?Plan: Failed response to olanzapine + fluoxetine for TR bipolar depression.   ?Reduce fluoxetine to 1 daily and reduce olanzapine to one half nightly for 10 days and then stop it. ?Then start Caplyta 1 daily ? ?08/27/2020 appointment with the following noted: ?Patient decompensated with the above switch to Caplyta with intrusive suicidal thoughts and had to be  hospitalized for psychiatric reasons.  Multiple phone calls with family members since that time.  Spoke with the psychiatric nurse practitioner with the decision to restart olanzapine in place of the Caplyta given the patient was more stable while on the combination of Seroquel and olanzapine than with the Caplyta. ?Caplyta triggered HI/SI and depressed and confused on it, and agitated and still has some of it now. ?No akathisia. ?Frustrated with lack of therapy at the hospital.   ?Hydroxyzine didn't help jitteriness.  ?Feels some better.  Fleeting SI & HI but not obsessive like it was prior to hospitalization.  Feels a little amped up and nervous.  Scared of what could happen.  Apprehensive being here  talking about things. ?5-6 hours sleep last night and would like to have more. At mom and dad's house right now and up more in the day. ?Inadvertently stopped lorazepam abruptly by mistake likely contributing to shakiness. ?Still some memory issues.  Trying to stay out of bed when watching TV.  Some awakening but managing. Occ fleeting SI and distracts himself.   Always spends a lot of time just laying around.   Can have anxiety for no reason like coming here, and varies in intensity without pattern.      Less panic than in the past.  Some chronic depression and hyperactivity and loudness and hyperverbal.  Usually back to sleep.  Total 6 hours but some napping, awakens 2-4 times nightly but back to sleep..  Taking quetiapine 600 mg HS.  still lacks interest and motivation.  Can follow a TV show if interested. ?Plan: Restart lorazepam 1 mg in the morning, 1 mg in the afternoon and 1 mg at night for anxiety ?Stop hydroxyzine  ?Increase olanzapine to 1 and 1/2 of 10 mg tablets in evening ? ?09/05/2020 appt noted: seen with parents today at his request ?Made med changes noted and tolerated the changes ?Better than I did last week. Now only fleeting HI/SI and "no where near what it was".  Sleeping better.  Still not a lot of  energy.  Anhedonia.  Less agitation.  A lot of anxiety all the time but it's helping.  Would like more energy and motivation.   ?Doesn't handle stress well. ?Parents note he's less shakey.  He's still staying

## 2021-08-31 ENCOUNTER — Other Ambulatory Visit: Payer: Self-pay | Admitting: Psychiatry

## 2021-08-31 DIAGNOSIS — F411 Generalized anxiety disorder: Secondary | ICD-10-CM

## 2021-08-31 DIAGNOSIS — F4001 Agoraphobia with panic disorder: Secondary | ICD-10-CM

## 2021-08-31 DIAGNOSIS — F314 Bipolar disorder, current episode depressed, severe, without psychotic features: Secondary | ICD-10-CM

## 2021-09-01 NOTE — Telephone Encounter (Signed)
Pt needs Lithium Carbonate refill sent to  ? ?[facility] - [place], [place] - [address]  ?[address] [place], [place] [address]  ?Phone:  [phone]  Fax:  [phone]  ? ?Next appt 5/15 ?

## 2021-09-09 ENCOUNTER — Other Ambulatory Visit: Payer: Self-pay | Admitting: Psychiatry

## 2021-09-09 DIAGNOSIS — F4001 Agoraphobia with panic disorder: Secondary | ICD-10-CM

## 2021-09-09 DIAGNOSIS — F411 Generalized anxiety disorder: Secondary | ICD-10-CM

## 2021-09-14 ENCOUNTER — Encounter: Payer: Self-pay | Admitting: Psychiatry

## 2021-09-14 ENCOUNTER — Ambulatory Visit: Payer: Medicare PPO | Admitting: Psychiatry

## 2021-09-14 DIAGNOSIS — F4001 Agoraphobia with panic disorder: Secondary | ICD-10-CM

## 2021-09-14 DIAGNOSIS — F411 Generalized anxiety disorder: Secondary | ICD-10-CM

## 2021-09-14 DIAGNOSIS — Z79899 Other long term (current) drug therapy: Secondary | ICD-10-CM

## 2021-09-14 DIAGNOSIS — F5105 Insomnia due to other mental disorder: Secondary | ICD-10-CM

## 2021-09-14 DIAGNOSIS — F314 Bipolar disorder, current episode depressed, severe, without psychotic features: Secondary | ICD-10-CM | POA: Diagnosis not present

## 2021-09-14 NOTE — Patient Instructions (Addendum)
stop fluoxetine 20 mg 1 daily  ?Start Auvelity off label for depression 1 in the AM for 1 week then 1 in AM and 1 with evening meal ?

## 2021-09-14 NOTE — Progress Notes (Signed)
Lilia Pro Hottinger ?[id number] ?Sep 01, 1965 ?56 y.o.  ? ? ?Subjective:  ? ?Patient ID:  HOLBERT CAPLES is a 56 y.o. (DOB 05-28-1965) male. ? ?Chief Complaint:  ?Chief Complaint  ?Patient presents with  ? Follow-up  ? Depression  ? Manic Behavior  ? ? ?Depression ?       Associated symptoms include decreased concentration.  Associated symptoms include no suicidal ideas.  Past medical history includes anxiety.   ?Anxiety ?Symptoms include decreased concentration and nervous/anxious behavior. Patient reports no confusion, dizziness, palpitations or suicidal ideas.  ? ?  ?[name] presents to the office today for follow-up of mixed bipolar, anxiety and still grieving stress of breakup.  He requires frequent follow-up because of long-term symptoms. ? ?At visit October 25, 2018.  We made several medicine changes because of his concerns about sleep and other issues.  We increased olanzapine back to 7.5 mg bc not sleeping as well at 5 mg.  He has to have the prescription for quetiapine written for up to 3 tablets at night in case insomnia is worse because insomnia makes his mood disorder so much worse.  We discussed that was above the usual max but the request was granted given his treatment resistant status. ?We also reduce lithium from 900 mg daily to 750 mg daily to try to reduce tremor and muscle twitches. ? ?At  visit March 28, 2019.  Fluoxetine was increased to 40 mg daily.   ?Early January 2021 increased to 60 mg daily.  No SE. ? ?seen June 28, 2019.  No further meds were changed except olanzapine was increased back to 10 mg daily to see if if he could get additional benefit per his request. ? ?Covid vaccinated.  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  Then had cholecystectomy. ? ?April 2021 appt with the following noted: ?2 episodes night sweats lately.  Memory has been very bad lately.  Repeatedly asks mother questions. ?Still  tend to stay in his room and his bed.  Still has rapid cycling mood swings.  Maybe some better with  increase in olanzapine to 10 and tolerating itl.  Would like to get out more but can't DT Covid.  Can perform necessary chores.  Will get out of the house when he can.  No change in death thoughts and anxiety in intensity but is better with frequency.  Usually comes and goes in waves but more persistent.  Consistent with meds.  Ativan not helping anxiety very dramatically but he's not sure.  Fidgety.  No trigger other than still grieving relationship and can't get it out of his head.  Poor energy, concentration and more forgetful. In bed more and less active.  Sleep ok lately which is unusual.  Can concentrate on financial matters and stock market at times.  Tolerating meds.  Still some intrusive SI without reason. ?Less frequent obsessive thoughts about broken relationship and has been doing this for months.  Can't let it go.  Loop.   ?No unusual stress even with the family who is supportive. ?Likes the benefit that Zyprexa gives. ?No sleepwalking nor falling nor odd behavior.  No history of sleep walking.  He understands this may recur with an increase.  Also wants option to rarely take extra quetiapine 300 for sleep prn. ?Plan:  Try to reduce lorazepam if possible. ? ?10/22/2019 appointment with the following noted: ?Continues fluoxetine 60, lithium 600 mg, lorazepam 2 mg AM and HS and 1 mg midday, olanzapine 10, quetiapine 600  mg HS. ?Still anxious chronically and including driving in crowded spaces.  Doesn't thing Ativan helps as well as Xanax but less cognitive problems. ?Sleep is not as good.  Occ EFA.  More EMA and wanting to do things in the middle of the night but this is not typical.  Wonders about why that happens.  Some napping.   ?Tolerating meds.  ?Asks about weight loss meds.  Disc this in detail.   ?Plan no changes except OK meclizine prn vertigo. ? ?02/11/20 appt with the following noted: ?Able to gradually reduce lorazepam to 1 mg AM and HS. ?More depressed over time and less interested in things  and less interest in going out but does with his parents. ?Has reduced from 800 to 600 mg HS with some awakening but usually able to go back to sleep.[name].  Spending a good amount of time in bed bc watches TV in bedroom.  Parents watch TV in different part of the house.  No mood swings he notices.   ?Concerns about weight gain about 200#. ?Likes olanzapine's benefit for sleep. ?Plan: Trial for TRD to  Increase fluoxetine to 80 mg daily  to use the combo with olanzapine for TR bipolar depression.  ? ?04/21/20 appt with following noted: ?I thought in beginning some benefit with fluoxetine.  Lifelong negative thinking continues.   Not much bipolar.  ?Reads a lot on bipolar.   ?Still tired and anxious a little more may be seasonal.  No familial stressors.  Tends to lay down in afternoon.  Anxiety not over anything in particular.   ?No SE with fluoxetine. ?Took meclizine prn.  Easily motion sick.    ?Asks questions about newer drugs for bipolar like Caplyta. ?Plan:  No med changes ? ?06/30/20 appt noted: ?Tired of wearing masks.  Vaccinated.  Asked questions about when this will end with mask mandate.  ?Mood up and down some since here and getting up 2-3 times.  Disc awakening problems.  Trying to do better bc night eating some.   Mood is about average today so far.  Micah FlesherWent out with friends for breakfast.  Recognizes activity helps mood.   ?3 days in a row napped a lot in the afternoon.   Bed is a comfort place.  Gets bored and hard to motivate.  Tries to stay away from night eating and spending.   ?More depressed when alone and wonders about med changes. ?Plan: Failed response to olanzapine + fluoxetine for TR bipolar depression.   ?Reduce fluoxetine to 1 daily and reduce olanzapine to one half nightly for 10 days and then stop it. ?Then start Caplyta 1 daily ? ?08/27/2020 appointment with the following noted: ?Patient decompensated with the above switch to Caplyta with intrusive suicidal thoughts and had to be hospitalized for  psychiatric reasons.  Multiple phone calls with family members since that time.  Spoke with the psychiatric nurse practitioner with the decision to restart olanzapine in place of the Caplyta given the patient was more stable while on the combination of Seroquel and olanzapine than with the Caplyta. ?Caplyta triggered HI/SI and depressed and confused on it, and agitated and still has some of it now. ?No akathisia. ?Frustrated with lack of therapy at the hospital.   ?Hydroxyzine didn't help jitteriness.  ?Feels some better.  Fleeting SI & HI but not obsessive like it was prior to hospitalization.  Feels a little amped up and nervous.  Scared of what could happen.  Apprehensive being here talking about things. ?  5-6 hours sleep last night and would like to have more. At mom and dad's house right now and up more in the day. ?Inadvertently stopped lorazepam abruptly by mistake likely contributing to shakiness. ?Still some memory issues.  Trying to stay out of bed when watching TV.  Some awakening but managing. Occ fleeting SI and distracts himself.   Always spends a lot of time just laying around.   Can have anxiety for no reason like coming here, and varies in intensity without pattern.      Less panic than in the past.  Some chronic depression and hyperactivity and loudness and hyperverbal.  Usually back to sleep.  Total 6 hours but some napping, awakens 2-4 times nightly but back to sleep..  Taking quetiapine 600 mg HS.  still lacks interest and motivation.  Can follow a TV show if interested. ?Plan: Restart lorazepam 1 mg in the morning, 1 mg in the afternoon and 1 mg at night for anxiety ?Stop hydroxyzine  ?Increase olanzapine to 1 and 1/2 of 10 mg tablets in evening ? ?09/05/2020 appt noted: seen with parents today at his request ?Made med changes noted and tolerated the changes ?Better than I did last week. Now only fleeting HI/SI and "no where near what it was".  Sleeping better.  Still not a lot of energy.   Anhedonia.  Less agitation.  A lot of anxiety all the time but it's helping.  Would like more energy and motivation.   ?Doesn't handle stress well. ?Parents note he's less shakey.  He's still staying with his pa

## 2021-09-22 ENCOUNTER — Other Ambulatory Visit: Payer: Self-pay | Admitting: Psychiatry

## 2021-09-22 DIAGNOSIS — F4001 Agoraphobia with panic disorder: Secondary | ICD-10-CM

## 2021-09-22 DIAGNOSIS — F411 Generalized anxiety disorder: Secondary | ICD-10-CM

## 2021-09-22 DIAGNOSIS — F314 Bipolar disorder, current episode depressed, severe, without psychotic features: Secondary | ICD-10-CM

## 2021-09-29 ENCOUNTER — Other Ambulatory Visit: Payer: Self-pay | Admitting: Psychiatry

## 2021-10-02 ENCOUNTER — Other Ambulatory Visit: Payer: Self-pay | Admitting: Psychiatry

## 2021-10-07 ENCOUNTER — Other Ambulatory Visit: Payer: Self-pay | Admitting: Psychiatry

## 2021-10-07 DIAGNOSIS — F4001 Agoraphobia with panic disorder: Secondary | ICD-10-CM

## 2021-10-07 DIAGNOSIS — F411 Generalized anxiety disorder: Secondary | ICD-10-CM

## 2021-10-14 ENCOUNTER — Other Ambulatory Visit: Payer: Self-pay | Admitting: Psychiatry

## 2021-10-14 DIAGNOSIS — F4001 Agoraphobia with panic disorder: Secondary | ICD-10-CM

## 2021-10-14 DIAGNOSIS — F411 Generalized anxiety disorder: Secondary | ICD-10-CM

## 2021-10-14 DIAGNOSIS — F314 Bipolar disorder, current episode depressed, severe, without psychotic features: Secondary | ICD-10-CM

## 2021-10-15 ENCOUNTER — Encounter: Payer: Self-pay | Admitting: Psychiatry

## 2021-10-15 ENCOUNTER — Ambulatory Visit: Payer: Medicare PPO | Admitting: Psychiatry

## 2021-10-15 DIAGNOSIS — T753XXD Motion sickness, subsequent encounter: Secondary | ICD-10-CM

## 2021-10-15 DIAGNOSIS — F4001 Agoraphobia with panic disorder: Secondary | ICD-10-CM | POA: Diagnosis not present

## 2021-10-15 DIAGNOSIS — F411 Generalized anxiety disorder: Secondary | ICD-10-CM | POA: Diagnosis not present

## 2021-10-15 DIAGNOSIS — R6889 Other general symptoms and signs: Secondary | ICD-10-CM

## 2021-10-15 DIAGNOSIS — F314 Bipolar disorder, current episode depressed, severe, without psychotic features: Secondary | ICD-10-CM

## 2021-10-15 DIAGNOSIS — F5105 Insomnia due to other mental disorder: Secondary | ICD-10-CM | POA: Diagnosis not present

## 2021-10-15 DIAGNOSIS — Z79899 Other long term (current) drug therapy: Secondary | ICD-10-CM

## 2021-10-15 DIAGNOSIS — G3184 Mild cognitive impairment, so stated: Secondary | ICD-10-CM

## 2021-10-15 MED ORDER — MECLIZINE HCL 25 MG PO TABS: 25.0000 mg | ORAL_TABLET | Freq: Three times a day (TID) | ORAL | 1 refills | Status: DC | PRN

## 2021-10-15 MED ORDER — MEMANTINE HCL 10 MG PO TABS: ORAL_TABLET | ORAL | 1 refills | Status: DC

## 2021-10-15 NOTE — Patient Instructions (Signed)
Stop Auvelity  Start Memantine for memory: 1/2 tablet in the AM for 1 week, then 1/2 tablet twice daily, then 1/2 tablet in the AM and 1 tablet at night for 1 week then 1 tablet twice daily

## 2021-10-15 NOTE — Progress Notes (Signed)
Albert [name] [id number] 14-Feb-1966 56 y.o.    Subjective:   Patient ID:  Albert [name] is a 56 y.o. (DOB 01/01/1966) male.  Chief Complaint:  Chief Complaint  Patient presents with   Follow-up   Depression   Anxiety   Manic Behavior   Sleeping Problem    Depression        Associated symptoms include decreased concentration.  Associated symptoms include no suicidal ideas.  Past medical history includes anxiety.   Anxiety Symptoms include decreased concentration and nervous/anxious behavior. Patient reports no chest pain, confusion, dizziness, palpitations or suicidal ideas.      Albert [name] presents to the office today for follow-up of mixed bipolar, anxiety and still grieving stress of breakup.  He requires frequent follow-up because of long-term symptoms.  At visit October 25, 2018.  We made several medicine changes because of his concerns about sleep and other issues.  We increased olanzapine back to 7.5 mg bc not sleeping as well at 5 mg.  He has to have the prescription for quetiapine written for up to 3 tablets at night in case insomnia is worse because insomnia makes his mood disorder so much worse.  We discussed that was above the usual max but the request was granted given his treatment resistant status. We also reduce lithium from 900 mg daily to 750 mg daily to try to reduce tremor and muscle twitches.  At  visit March 28, 2019.  Fluoxetine was increased to 40 mg daily.   Early January 2021 increased to 60 mg daily.  No SE.  seen June 28, 2019.  No further meds were changed except olanzapine was increased back to 10 mg daily to see if if he could get additional benefit per his request.  Covid vaccinated.  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  Then had cholecystectomy.  April 2021 appt with the following noted: 2 episodes night sweats lately.  Memory has been very bad lately.  Repeatedly asks mother questions. Still  tend to stay in his room and his bed.  Still has rapid  cycling mood swings.  Maybe some better with increase in olanzapine to 10 and tolerating itl.  Would like to get out more but can't DT Covid.  Can perform necessary chores.  Will get out of the house when he can.  No change in death thoughts and anxiety in intensity but is better with frequency.  Usually comes and goes in waves but more persistent.  Consistent with meds.  Ativan not helping anxiety very dramatically but he's not sure.  Fidgety.  No trigger other than still grieving relationship and can't get it out of his head.  Poor energy, concentration and more forgetful. In bed more and less active.  Sleep ok lately which is unusual.  Can concentrate on financial matters and stock market at times.  Tolerating meds.  Still some intrusive SI without reason. Less frequent obsessive thoughts about broken relationship and has been doing this for months.  Can't let it go.  Loop.   No unusual stress even with the family who is supportive. Likes the benefit that Zyprexa gives. No sleepwalking nor falling nor odd behavior.  No history of sleep walking.  He understands this may recur with an increase.  Also wants option to rarely take extra quetiapine 300 for sleep prn. Plan:  Try to reduce lorazepam if possible.  10/22/2019 appointment with the following noted: Continues fluoxetine 60, lithium 600 mg, lorazepam 2 mg AM  and HS and 1 mg midday, olanzapine 10, quetiapine 600 mg HS. Still anxious chronically and including driving in crowded spaces.  Doesn't thing Ativan helps as well as Xanax but less cognitive problems. Sleep is not as good.  Occ EFA.  More EMA and wanting to do things in the middle of the night but this is not typical.  Wonders about why that happens.  Some napping.   Tolerating meds.  Asks about weight loss meds.  Disc this in detail.   Plan no changes except OK meclizine prn vertigo.  02/11/20 appt with the following noted: Able to gradually reduce lorazepam to 1 mg AM and HS. More  depressed over time and less interested in things and less interest in going out but does with his parents. Has reduced from 800 to 600 mg HS with some awakening but usually able to go back to sleep.[name]  Spending a good amount of time in bed bc watches TV in bedroom.  Parents watch TV in different part of the house.  No mood swings he notices.   Concerns about weight gain about 200#. Likes olanzapine's benefit for sleep. Plan: Trial for TRD to  Increase fluoxetine to 80 mg daily  to use the combo with olanzapine for TR bipolar depression.   04/21/20 appt with following noted: I thought in beginning some benefit with fluoxetine.  Lifelong negative thinking continues.   Not much bipolar.  Reads a lot on bipolar.   Still tired and anxious a little more may be seasonal.  No familial stressors.  Tends to lay down in afternoon.  Anxiety not over anything in particular.   No SE with fluoxetine. Took meclizine prn.  Easily motion sick.    Asks questions about newer drugs for bipolar like Caplyta. Plan:  No med changes  06/30/20 appt noted: Tired of wearing masks.  Vaccinated.  Asked questions about when this will end with mask mandate.  Mood up and down some since here and getting up 2-3 times.  Disc awakening problems.  Trying to do better bc night eating some.   Mood is about average today so far.  Martin Majestic out with friends for breakfast.  Recognizes activity helps mood.   3 days in a row napped a lot in the afternoon.   Bed is a comfort place.  Gets bored and hard to motivate.  Tries to stay away from night eating and spending.   More depressed when alone and wonders about med changes. Plan: Failed response to olanzapine + fluoxetine for TR bipolar depression.   Reduce fluoxetine to 1 daily and reduce olanzapine to one half nightly for 10 days and then stop it. Then start [place] 1 daily  08/27/2020 appointment with the following noted: Patient decompensated with the above switch to [place] with  intrusive suicidal thoughts and had to be hospitalized for psychiatric reasons.  Multiple phone calls with family members since that time.  Spoke with the psychiatric nurse practitioner with the decision to restart olanzapine in place of the [place] given the patient was more stable while on the combination of Seroquel and olanzapine than with the [place]. Caplyta triggered HI/SI and depressed and confused on it, and agitated and still has some of it now. No akathisia. Frustrated with lack of therapy at the hospital.   Hydroxyzine didn't help jitteriness.  Feels some better.  Fleeting SI & HI but not obsessive like it was prior to hospitalization.  Feels a little amped up and nervous.  Scared of  what could happen.  Apprehensive being here talking about things. 5-6 hours sleep last night and would like to have more. At mom and dad's house right now and up more in the day. Inadvertently stopped lorazepam abruptly by mistake likely contributing to shakiness. Still some memory issues.  Trying to stay out of bed when watching TV.  Some awakening but managing. Occ fleeting SI and distracts himself.   Always spends a lot of time just laying around.   Can have anxiety for no reason like coming here, and varies in intensity without pattern.      Less panic than in the past.  Some chronic depression and hyperactivity and loudness and hyperverbal.  Usually back to sleep.  Total 6 hours but some napping, awakens 2-4 times nightly but back to sleep..  Taking quetiapine 600 mg HS.  still lacks interest and motivation.  Can follow a TV show if interested. Plan: Restart lorazepam 1 mg in the morning, 1 mg in the afternoon and 1 mg at night for anxiety Stop hydroxyzine  Increase olanzapine to 1 and 1/2 of 10 mg tablets in evening  09/05/2020 appt noted: seen with parents today at his request Made med changes noted and tolerated the changes Better than I did last week. Now only fleeting HI/SI and "no where near what it  was".  Sleeping better.  Still not a lot of energy.  Anhedonia.  Less agitation.  A lot of anxiety all the time but it's helping.  Would like more energy and motivation.   Doesn't handle stress well. Parents note he's less shakey.  He's still staying with his parents.  Only driven once since this happened and F wants him to be able to drive and function more independently.  M agrees he's better.  Memory is better but not normal.  Primarily STM problems No akathisia with olanzapine this time.    Administering his own meds but mo watched. Slept 8 hours last night. Plan consider further reductions in quetiapine  10/17/20 appt noted: Stayed on Seroquel 600 HS and olanzapine 15. Olanzapine seemed to do most for the sleep.  Also helping eliminated HI/SI for the most part.  Wants to try to increase it.  Depression is much better with less rumination also.  Fears akathisia at higher dose as in the past. Also on fluoxetine 40.   Plan: Reduce Seroquel to 1 and 1/2 tablets at night Increase olanzapine to 1 of the 15 mg tablet and 1/2 of the 5 mg tablet for 1 week,  Then, if tolerated increase the olanzapine to 20 mg or 1 of the 15 mg tablets and 1 of the 5 mg tablets. If possible then also reduce quetiapine to 1 tablet at night.  11/05/2020 appointment with the following noted: Up to olanzapine 20 mg hs for 3 days.  Reduced Serooquel to 450 mg HS.  Didn't try to go lower. Had hiatal hernia and umbilical hernia surgery recently with no problem.   No akathisia so far. Sleep ok with meds so far changes.  No mood changes yet but overall likes the smoothness of olanzapine and helping sleep without knociing him out. Still some occ HI/SI, he thinks bc of anxiety generally.  Easily anxious. Plan: Continue olanzapine 20 mg nightly longer to give it time to help mood sx reduce quetiapine to 1 tablet at night to minimize polypharmacy and reduce risk of akathisia.  01/07/21 appt noted: Able to reduce quetiapine 300 mg  HS ok with decent sleep  but still wakes a lot.  No AM hangover.  Don't have a lot to do.  Can enjoy going out and doing things.  But doesn't do it longterm.  Family is doing good.  Father started year 65 at his job.   Tolerating the meds well.  May wake more with quetiapine reduction. Lost 10#. Wonders if could try other meds he didn't tolerate before bc now can tolerate olanzapine and couldn't tolerate it in the past DT akathisia. Mood never great but can swing into depresssion without reason.   Plan: Continue olanzapine 20 mg nightly longer to give it time to help mood sx reduce quetiapine to 1/2 of the 300 mg  tablet at night to minimize polypharmacy and reduce risk of akathisia.  02/02/2021 appointment with the following noted: Tried reduced quetiapine to 150 mg and after a week had withdrawal sx including jittery and SI and he increased to 250 mg daily.  Felt better after increasing to 300 mg daily and last week reduced to 250 mg daily.  After increase quetiapine felt better in a few days.  Seems more alert with less quetiapine but can't tolerate a quick withdrawal.   Some awakening.  Plan: Reduce quetiapine by 50 mg every 2-4 weeks.  03/09/2021 phone call: Pt called and said that he is weaning off the seroquel. He was down to 200 mg of the seoquel and he started having withdrawal symptoms and sucidal thoughts. He went back up to 250 mg because he knew he was good on that.    Pt stated he starting back taking 250 mg on Saturday due to the symptoms he was having.Now he is no longer having suicidal thoughts but still very jittery. MD: He may just need to taper slower than most people.  If he can tolerate the 250 mg Seroquel with the jitteriness it should get better over the next 1 to 2 weeks.  Then he can try going down again by 50 mg at a time.  He probably needs to go down by just 50 mg every 2 to 4 weeks and wait till he fully adjusts to each reduction before he reduces again.  03/17/21 appt  noted: Hernia surgery 2 weeks ago. Seroquel 250 mg for a month then 200 mg daily and had SI and increased again to 300 mg daily.  Would love to get off it.  Withdrawal triggers intrusive HI/SI but otherwise doesn't have them No SE. Sleep is OK but not enough REM sleep.  Satisfied with other meds.   No current HI/SI.  No paranoia.  Still has anxiety and taking Ativan every 6 hours.  It works but doesn't cure it. Chronic anxiety and ask about ketamine for chronic depression. Plan: Because he had WD reducing from 250 to 200 mg daily will have to go slowly. Reduce quetiapine by 25 mg every 2-4 weeks. Reduce Seroquel to 250 mg for 2 weeks, Then reduce to 225 mg for 2 weeks, Then reduce to 200 mg for 2 weeks, Then reduce to 175 mg for 2 weeks, Then reduce to 150 mg Also: Buspirone 30 mg tablets for anxiety Start 1/3 tablet twice daily for 1 week Then increase to two thirds twice daily for 1 week Then increase to 1 tablet twice daily  04/15/2021 appointment with the following noted: Reduced Seroquel to 200 mg HS and increased buspirone to 30 mg BID STM px and wants to try to reduce Ativan to see if it is better.  But fears with drawal  sx. Parents complain about his memory. Less HI and SI since here. Less depressed. Plan: Buspirone 30 mg tablets for anxiety Start 1/3 tablet twice daily for 1 week Then increase to two thirds twice daily for 1 week Then increase to 1 tablet twice daily Continue olanzapine 20 mg nightly longer to give it time to help mood sx Because he had WD reducing from 250 to 200 mg daily will have to go slowly. Reduce quetiapine by 25 mg every 2-4 weeks. Reduce Seroquel to 250 mg for 2 weeks, Then reduce to 225 mg for 2 weeks, Then reduce to 200 mg for 2 weeks, Then reduce to 175 mg for 2 weeks, Then reduce to 150 mg Trial taper lorazepam: In hopes of improving short-term memory  04/30/2022 phone call: Complaining of withdrawal symptoms reducing lorazepam from 0.5  mg 5 times a day to 0.5 mg 4 times daily.  05/21/2021 appointment with the following noted: Gotten onto buspar 30 BID and down on lorazepam to 0.5 mg QID. Gotten down to Seroquel 200 mg HS A little restless every now and then but able to reduce lorazepam for the last weeks. Is too inactive and lays in bed too much watching TV.   Can' t tell effect of buspirone bc of other changes.  Sleep with awakening with 6-7 hours. Fleeting SI without trigger or plan or intent. Depressed more than anxious. Enjoys watching stocks and investing. Plan: Trial taper lorazepam gradually over time In hopes of improving short-term memory But for now continue lorazepam 0.5 mg QID Start clonidine 0.1 mg tablets one half at night for 3 nights, then 1/2 tablet twice daily for 3 nights, then one half in the morning and 1 tablet at night If clonidine does not noticeably help anxiety call the office  05/27/2021 phone call initially complaining of homicidal and suicidal thoughts after taking 1 mg of clonidine.  He was told to stop the medication.  But then called back stating he was having homicidal and suicidal thoughts from withdrawal from the medication which was not logical.  He agreed it was not logical and stated on 05/29/2021 that he felt better  06/23/2021 appointment the following noted: No clonidine. Sunday panic attack eating out and then having SI.  Stayed with father to feel safer. Intrusive thoughts of hurting parents or others.  HI only when has SI.  Usually panic without SI.  Then had intrusive thoughts about hurting others.   Usually intrusive thoughts are brief and fleeting.    07/20/21  appt noted; More anxious with less lithium.  More anxious in his body.  Not more depressed or irritable.  Sleep is the same.  More racing thoughts.  Chronic SI probably a litte worse but not unmanageable. Rduced lithium last week to 300 mg daily. No recent change in lisinopril. Off the clonidine for a week or so. Doesn't  drink much water.  Drinks a lot of diet coke. No marked akathisia or RLS but does shake his leg DT anxiety. Worries about needing mor emedicine. Plan: Increase olanzapine to 25 mg nightly bc more racing thoughts and anxiety with less lithium DT severity of sx and difficulty getting off Seroquel conside rincrease.  07/30/21 TC : CO racing SI today and wonders about whether increased sx racing thoughts, hyperactivity, SI related to Reduced lithium from 600 mg daily to 300 mg daily recently DT lithium level 1.8.  could be related.    Plan: increase olanzapine 30 mg pm Today take lithium 600 mg  now.  Tomorrow start lithium CR 450 mg daily Continue olanzapine 30 mg PM until SI resolves unless akathisia returns.  Albert Staggers, MD, DFAPA  08/17/21 appt noted:  No akathisia with olanzapine 30 mg pm (about 3 weeks) and is sleeping better. Increased lithium to 450 mg daily. Tolerating meds. Still racing negative thoughts maybe some better.  Asked about it. SI come and go.   Anxiety about the same as depression.  No motivation unless has to do something. Plan: Reduce  fluoxetine 20 mg 1 daily in hopes racing thoughts and anxiety are better.  09/14/2021 appointment with the following noted: No difference noted with reduction fluoxetine 20 mg daily. Most of the time negative thoughts, ex "what if I get in a wreck?"  Frequent negative thoughts.   Still on lorazepam 0.5 mg TID.   Wonders about trying prior meds that caused akathisia bc no longer gets it with olanzapine 30 mg daily. Sleep is usually ok.  Taking Seroquel 200 mg HS.  Thinks the olanzapine helps his sleep and doesn't want to stop it. Panic 1-2 times per month.  Then takes extra lorazepam. Plan: DC fluoxetine 20 mg 1 daily  Trial Auvelity off label for depression 1 in the AM for 1 week then 1 in AM and 1 with evening meal  10/15/2021 appointment with the following noted: Occ of getting confused as to day and went to church on a Thursday my  mistake.   Had it happen a few months ago too.  Memory is terrible. Thinks he couldn't tolerate more Auvelity. Not a lot of benefit from [place] 1 daily. Still quite a bit of anxiety.   A little drowsy  and sleeps 7-8 hours at night. Word finding problems.  Lost wallet.  Psych med hx extensive including ECT and  risperidone, Zyprexa 20-15 akathisia, Latuda 80 which caused akathisia, Abilify, Vraylar, Rexulti, aripiprazole 20 mg with akathisia,  Seroquel 1000 mg,  InVega, Geodon, Saphris with side effects, symbyax, Fanapt NR .  Caplyta SE and markedly worse. lithium 1200 SE,  lamotrigine 300 mg, Depakote 2000 mg, Tegretol,Trileptal and several of these in combinations, gabapentin,  N-acetylcysteine, Nuedexta,    Belsomra with no response,  Lunesta no response, trazodone 200 mg,  Xanax, clonazepam, lorazepam less sedation. Buspirone NR Clonidine SE with 2 trials  Viibryd 40 mg for 3 months with diarrhea, protriptyline with side effects,  Trintellix 20 mg,  Parnate 50 mg with no response,  imipramine, venlafaxine,  Emsam 12 mg for 2 months,  bupropion was side effects,  Lexapro 20 mg, sertraline, paroxetine, Deplin, fluoxetine 80 Auvelity NR at one daily.  SE BID  methylphenidate 60 mg,  Vyvanse, Concerta, strattera, , modafinil,  pramipexole,  amantadine , Patient prone to akathisia.   Review of Systems:  Review of Systems  Cardiovascular:  Negative for chest pain and palpitations.  Musculoskeletal:  Positive for back pain.  Neurological:  Positive for tremors. Negative for dizziness.       Fidgety  Psychiatric/Behavioral:  Positive for decreased concentration. Negative for agitation, behavioral problems, confusion, dysphoric mood, hallucinations, self-injury, sleep disturbance and suicidal ideas. The patient is nervous/anxious and is hyperactive.     Medications: I have reviewed the patient's current medications.  Current Outpatient Medications  Medication Sig Dispense  Refill   acetaminophen (TYLENOL) 500 MG tablet Take 1,000 mg by mouth every 6 (six) hours as needed for moderate pain.     fenofibrate (TRICOR) 145 MG tablet Take 145 mg by mouth daily.  fluticasone (FLONASE) 50 MCG/ACT nasal spray Place 1 spray into both nostrils daily as needed for allergies or rhinitis.     hydrocortisone (ANUSOL-HC) 2.5 % rectal cream Place 1 application rectally 3 (three) times daily as needed for hemorrhoids.     levothyroxine (SYNTHROID, LEVOTHROID) 75 MCG tablet Take 75 mcg by mouth daily before breakfast.     lisinopril (ZESTRIL) 5 MG tablet Take 5 mg by mouth daily.     lithium carbonate (ESKALITH) 450 MG CR tablet TAKE 1 TABLET BY MOUTH AT BEDTIME 30 tablet 0   LORazepam (ATIVAN) 0.5 MG tablet TAKE 1 TABLET BY MOUTH FOUR TIMES DAILY 120 tablet 1   memantine (NAMENDA) 10 MG tablet 1/2 tablet in the AM for 1 week, then 1/2 tablet twice daily, then 1/2 tablet in the AM and 1 tablet at night for 1 week then 1 tablet twice daily 60 tablet 1   OLANZapine (ZYPREXA) 10 MG tablet TAKE 1 TABLET BY MOUTH NIGHTLY 90 tablet 0   OLANZapine (ZYPREXA) 20 MG tablet Take 1 tablet (20 mg total) by mouth at bedtime. 30 tablet 1   polycarbophil (FIBERCON) 625 MG tablet Take 1,250 mg by mouth daily.     QUEtiapine (SEROQUEL) 200 MG tablet TAKE 1 TABLET BY MOUTH AT BEDTIME. 90 tablet 0   sildenafil (REVATIO) 20 MG tablet Take 5 tablets (100 mg total) by mouth daily as needed (ED). 30 tablet 11   tamsulosin (FLOMAX) 0.4 MG CAPS capsule Take 1 capsule (0.4 mg total) by mouth daily. 90 capsule 3   cloNIDine (CATAPRES) 0.1 MG tablet 1 twice daily for 3 days then 2 twice daily (Patient not taking: Reported on 10/15/2021) 120 tablet 0   meclizine (ANTIVERT) 25 MG tablet Take 1 tablet (25 mg total) by mouth 3 (three) times daily as needed for dizziness. 30 tablet 1   No current facility-administered medications for this visit.   Medication Side Effects: Other: mild sleepiness.   Occ twitches.\,  tremor  Allergies:  Allergies  Allergen Reactions   Meloxicam Other (See Comments)    Other reaction(s): Dizziness   Ibuprofen     Can not take because taking lithium   Prednisone     Can't sleep    Wellbutrin [Bupropion]     Suicide thoughts    Past Medical History:  Diagnosis Date   ADHD (attention deficit hyperactivity disorder)    Anemia    Anxiety    Bipolar disorder (HCC)    BPH (benign prostatic hyperplasia)    Chronic kidney disease, stage 3b (HCC)    DDD (degenerative disc disease), cervical    Depression    Deviated septum    ED (erectile dysfunction)    GERD (gastroesophageal reflux disease)    Graves disease    History of hiatal hernia    Hypertension    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    Hypothyroidism    Pneumonia    PONV (postoperative nausea and vomiting)     Family History  Problem Relation Age of Onset   Prostate cancer Neg Hx    Bladder Cancer Neg Hx    Kidney cancer Neg Hx     Social History   Socioeconomic History   Marital status: Single    Spouse name: Not on file   Number of children: Not on file   Years of education: Not on file   Highest education level: Not on file  Occupational History   Not on file  Tobacco Use   Smoking status: Former    Types: Cigarettes   Smokeless tobacco: Former    Types: Nurse, children's Use: Never used  Substance and Sexual Activity   Alcohol use: Not Currently   Drug use: Never   Sexual activity: Yes  Other Topics Concern   Not on file  Social History Narrative   Not on file   Social Determinants of Health   Financial Resource Strain: Not on file  Food Insecurity: Not on file  Transportation Needs: Not on file  Physical Activity: Not on file  Stress: Not on file  Social Connections: Not on file  Intimate Partner Violence: Not on file    Past Medical History, Surgical history, Social history, and Family history were reviewed and updated as  appropriate.   Please see review of systems for further details on the patient's review from today.   Objective:   Physical Exam:  There were no vitals taken for this visit.  Physical Exam Constitutional:      General: He is not in acute distress.    Appearance: He is well-developed.  Musculoskeletal:        General: No deformity.  Neurological:     Mental Status: He is alert and oriented to person, place, and time.     Cranial Nerves: No dysarthria.     Motor: Tremor present.     Coordination: Coordination normal.     Comments: Slight tremor  Psychiatric:        Attention and Perception: Attention and perception normal. He does not perceive auditory or visual hallucinations.        Mood and Affect: Mood is anxious and depressed. Affect is not blunt, angry, tearful or inappropriate.        Speech: Speech normal. Speech is not slurred.        Behavior: Behavior normal. Behavior is not slowed. Behavior is cooperative.        Thought Content: Thought content is not delusional. Thought content does not include homicidal or suicidal ideation. Thought content does not include suicidal plan.        Cognition and Memory: Cognition normal. He exhibits impaired recent memory.        Judgment: Judgment normal.     Comments: Insight fair He is chronically anxious  worse with less lithium No manic signs noted. Some wordfinding problems ongoing. Intrusive thoughts lately Chronically down but not worse Occ SI but can kick them out     Lab Review:     Component Value Date/Time   NA 139 02/26/2021 1253   NA 142 03/20/2014 0514   K 4.0 02/26/2021 1253   K 4.1 03/20/2014 0514   CL 109 02/26/2021 1253   CL 112 (H) 03/20/2014 0514   CO2 22 02/26/2021 1253   CO2 26 03/20/2014 0514   GLUCOSE 83 02/26/2021 1253   GLUCOSE 99 03/20/2014 0514   BUN 13 02/26/2021 1253   BUN 7 03/20/2014 0514   CREATININE 1.56 (H) 02/26/2021 1253   CREATININE 0.78 03/20/2014 0514   CALCIUM 9.6 02/26/2021  1253   CALCIUM 9.5 03/20/2014 0514   PROT 7.8 08/11/2020 1750   PROT 7.3 03/18/2014 1459   ALBUMIN 4.7 08/11/2020 1750   ALBUMIN 3.2 (L) 03/18/2014 1459   AST 23 08/11/2020 1750   AST 16 03/18/2014 1459   ALT 16 08/11/2020 1750   ALT 16 03/18/2014 1459   ALKPHOS 34 (L) 08/11/2020 1750   ALKPHOS 105  03/18/2014 1459   BILITOT 0.5 08/11/2020 1750   BILITOT 0.3 03/18/2014 1459   GFRNONAA 51 (L) 02/26/2021 1253   GFRNONAA >60 03/20/2014 0514   GFRAA 54 (L) 04/18/2019 1612   GFRAA >60 03/20/2014 0514       Component Value Date/Time   WBC 6.2 02/26/2021 1253   RBC 4.17 (L) 02/26/2021 1253   HGB 13.3 02/26/2021 1253   HGB 12.6 (L) 03/20/2014 0514   HCT 39.4 02/26/2021 1253   HCT 38.4 (L) 03/20/2014 0514   PLT 431 (H) 02/26/2021 1253   PLT 346 03/20/2014 0514   MCV 94.5 02/26/2021 1253   MCV 89 03/20/2014 0514   MCH 31.9 02/26/2021 1253   MCHC 33.8 02/26/2021 1253   RDW 13.1 02/26/2021 1253   RDW 12.5 03/20/2014 0514   LYMPHSABS 3.2 03/20/2014 0514   MONOABS 1.0 03/20/2014 0514   EOSABS 0.4 03/20/2014 0514   BASOSABS 0.1 03/20/2014 0514    Lithium Lvl  Date Value Ref Range Status  08/12/2021 0.9 0.5 - 1.2 mmol/L Final    Comment:    A concentration of 0.5-0.8 mmol/L is advised for long-term use; concentrations of up to 1.2 mmol/L may be necessary during acute treatment.                                  Detection Limit = 0.1                           <0.1 indicates None Detected    08/12/21 lithium level 0.9 on 450 mg.  lithium level Sept 0.8.   lithium level July 27, 2018 was normal at 1.0.   Lithium level [facility] October 03, 2018 = 1.2. Said he got lithium level at [place] as requested.  Labs not in Epic.  Recent lipids ok except higher TG than usual.  Normal A1C.  No results found for: "PHENYTOIN", "PHENOBARB", "VALPROATE", "CBMZ"   .res Assessment: Plan:    Reichen was seen today for follow-up, depression, anxiety, manic behavior and sleeping problem.  Diagnoses  and all orders for this visit:  Severe bipolar I disorder, current or most recent episode depressed (HCC)  Generalized anxiety disorder  Panic disorder with agoraphobia  Insomnia due to mental condition  Lithium use  Motion sickness, subsequent encounter -     meclizine (ANTIVERT) 25 MG tablet; Take 1 tablet (25 mg total) by mouth 3 (three) times daily as needed for dizziness.  Forgetfulness  Mild cognitive impairment -     memantine (NAMENDA) 10 MG tablet; 1/2 tablet in the AM for 1 week, then 1/2 tablet twice daily, then 1/2 tablet in the AM and 1 tablet at night for 1 week then 1 tablet twice daily    Chronic TR bipolar mixed and chronic anxiety.  He usually has mixed bipolar symptoms which we have not been able to completely eliminate.  See long list of meds tried.   He is somewhat chronically unstable but better on olanzapine worse if reduces quetiapine quickly.  Discussed mother's concerns about his memory which is primarily a short-term memory issue.  We discussed the alternative options of using Namenda off label for mild cognitive impairment.  We are not yet able to reduce the dose of lorazepam but that could potentially help as well.  His memory was much worse on Xanax than lorazepam. We discussed the short-term risks associated with  benzodiazepines including sedation and increased fall risk among others.  Discussed long-term side effect risk including dependence, potential withdrawal symptoms, and the potential eventual dose-related risk of dementia. Disc newer studies refute dementia risks.  Unfortunately due to the severity of set his symptoms polypharmacy is a necessity.  Disc unusual combo antipsychotics but it helps more than other options.  Discussed potential metabolic side effects associated with atypical antipsychotics, as well as potential risk for movement side effects. Advised pt to contact office if movement side effects occur.  He had some falling around the time  when he previously took olanzapine but he took it for several months at this dosage before he had any side effect issues and he was on Xanax at the time that the following occurred. Take LED Seroquel to sleep.   No obvious alternatives for sleep  Lithium being used bc chronic SI and death thoughts.   Counseled patient regarding potential benefits, risks, and side effects of lithium to include potential risk of lithium affecting thyroid and renal function.  Discussed need for periodic lab monitoring to determine drug level and to assess for potential adverse effects.  Counseled patient regarding signs and symptoms of lithium toxicity and advised that they notify office immediately or seek urgent medical attention if experiencing these signs and symptoms.  Patient advised to contact office with any questions or concerns. Continue lithium  450 mg daily Lithium level 0.7 on 3/115/23 on 300 mg daily Call if death thoughts worsen or worsening SI.   Disc SE. Disc combo with Seroquel may lower response rate but he doesn't think he can sleep without Seroquel.  continue olanzapine to 30 mg nightly  for more racing thoughts and anxiety with less lithium DT severity of sx and difficulty getting off Seroquel conside rincrease.  Consider clozapine.  Disc risk in detail including low WBC with complication, myocarditis.  Extensive discussion of CBC monitoring.  Disc WD  and may need to go lower before he can stop it. continue Seroquel 200 mg .  Would like to see him taper off if possible.  DC  [place]   For MCI: memantine 1/2 tablet in the AM for 1 week, then 1/2 tablet twice daily, then 1/2 tablet in the AM and 1 tablet at night for 1 week then 1 tablet twice daily  But for now continue lorazepam 0.5 mg QID  We discussed the short-term risks associated with benzodiazepines including sedation and increased fall risk among others.  Discussed long-term side effect risk including dependence, potential  withdrawal symptoms, and the potential eventual dose-related risk of dementia.  But recent studies from 2020 dispute this association between benzodiazepines and dementia risk. Newer studies in 2020 do not support an association with dementia.  Discussed safety plan at length with patient.  Advised patient to contact office with any worsening signs and symptoms.  Instructed patient to go to the [facility] emergency room for evaluation if experiencing any acute safety concerns, to include suicidal intent.  Disc Ozempic and Monjauro for weight loss.  Has had weight gain from meds as a contributor. But lately has lost some weight.  Needs to keep up activity as much as possible for depression.  This appt was 30 mins.  FU 4 weeks   Lynder Parents, MD, DFAPA  Future Appointments  Date Time Provider [place]  11/16/2021  1:30 PM Cottle, Billey Co., MD [facility] None  12/30/2021  2:45 PM Billey Co, MD [facility] None    No  orders of the defined types were placed in this encounter.      -------------------------------

## 2021-10-16 ENCOUNTER — Telehealth: Payer: Self-pay | Admitting: Psychiatry

## 2021-10-16 NOTE — Telephone Encounter (Signed)
Fluoxetine was stopped as it was ineffective. Patient notified and it is no longer on his medication list.

## 2021-10-16 NOTE — Telephone Encounter (Signed)
Albert [name] was seen yesterday by Dr. Jennelle Human. He states per Dr. Jennelle Human two months ago, took Rowan off the Fluoxetine. He has not taken it since this time. When he went to the pharmacy to pick up his medication the pharmacy had filled his Fluoxetine. He wants to know if he is supposed be taking this again? His phone number is [phone].

## 2021-10-19 ENCOUNTER — Telehealth: Payer: Self-pay | Admitting: Psychiatry

## 2021-10-19 NOTE — Telephone Encounter (Signed)
Albert [name] called this morning at 10:20 to report he is experiencing some suicidal thoughts and is thinking it is the memantine.  He has been taking it for about 5 days and is wanting to know if this is a side effect.  If it is, he can not continue this medication.  Please call to discuss.  Appt 9/17

## 2021-10-19 NOTE — Telephone Encounter (Signed)
Spoke to pt.He is taking 1/2 tab daily.Saturday he started having suicidal thoughts out of no where and thinks it's from the memantine.

## 2021-10-19 NOTE — Telephone Encounter (Signed)
Pt informed

## 2021-10-19 NOTE — Telephone Encounter (Signed)
I've never seen it but anything is possible.  Suggest he stop it and see.  If related will get better quickly.

## 2021-10-22 ENCOUNTER — Telehealth: Payer: Self-pay | Admitting: Psychiatry

## 2021-10-22 NOTE — Telephone Encounter (Signed)
See message from patient. Called him and he denies having a plan. He is with his mother currently. Emergency resources reviewed with him.  His affect does not seem flat. His voice is the same as in past conversations. He just wants to feel better. He could not say how much, but said he is probably taking more than 4 Ativan a day. He said it was too easy to pop a pill and he didn't know what his limit should be.  The last time I talked to him when he was having suicidal thoughts the increased olanzapine was beneficial.  He is asking if he can increase the dose. I told him that I couldn't tell him what to do in regards to medication, but that I would let you know what is going on and call him back with your recommendations.

## 2021-10-22 NOTE — Telephone Encounter (Signed)
Pt is having more suicidal thoughts over last couple days. He feels agitated and restless too. Please see if he can add anything to help with this. (Recent stress with buying a car and having to put $$ into it.)

## 2021-10-23 ENCOUNTER — Other Ambulatory Visit: Payer: Self-pay | Admitting: Psychiatry

## 2021-10-23 MED ORDER — PAROXETINE HCL 20 MG PO TABS
ORAL_TABLET | ORAL | 1 refills | Status: DC
Start: 2021-10-23 — End: 2021-12-10

## 2021-10-23 NOTE — Telephone Encounter (Signed)
Patient was notified of recommendations and Rx.

## 2021-10-28 ENCOUNTER — Other Ambulatory Visit: Payer: Self-pay | Admitting: Psychiatry

## 2021-11-05 ENCOUNTER — Other Ambulatory Visit: Payer: Self-pay | Admitting: Psychiatry

## 2021-11-05 DIAGNOSIS — F4001 Agoraphobia with panic disorder: Secondary | ICD-10-CM

## 2021-11-05 DIAGNOSIS — F411 Generalized anxiety disorder: Secondary | ICD-10-CM

## 2021-11-10 ENCOUNTER — Other Ambulatory Visit: Payer: Self-pay | Admitting: Psychiatry

## 2021-11-10 DIAGNOSIS — F314 Bipolar disorder, current episode depressed, severe, without psychotic features: Secondary | ICD-10-CM

## 2021-11-10 DIAGNOSIS — F411 Generalized anxiety disorder: Secondary | ICD-10-CM

## 2021-11-10 DIAGNOSIS — F4001 Agoraphobia with panic disorder: Secondary | ICD-10-CM

## 2021-11-16 ENCOUNTER — Ambulatory Visit: Payer: Medicare PPO | Admitting: Psychiatry

## 2021-11-16 ENCOUNTER — Encounter: Payer: Self-pay | Admitting: Psychiatry

## 2021-11-16 VITALS — BP 125/73 | HR 102

## 2021-11-16 DIAGNOSIS — F314 Bipolar disorder, current episode depressed, severe, without psychotic features: Secondary | ICD-10-CM | POA: Diagnosis not present

## 2021-11-16 DIAGNOSIS — F5105 Insomnia due to other mental disorder: Secondary | ICD-10-CM | POA: Diagnosis not present

## 2021-11-16 DIAGNOSIS — F4001 Agoraphobia with panic disorder: Secondary | ICD-10-CM

## 2021-11-16 DIAGNOSIS — Z79899 Other long term (current) drug therapy: Secondary | ICD-10-CM

## 2021-11-16 DIAGNOSIS — G3184 Mild cognitive impairment, so stated: Secondary | ICD-10-CM

## 2021-11-16 DIAGNOSIS — F411 Generalized anxiety disorder: Secondary | ICD-10-CM | POA: Diagnosis not present

## 2021-11-16 MED ORDER — OLANZAPINE 10 MG PO TABS: 10.0000 mg | ORAL_TABLET | Freq: Every day | ORAL | 0 refills | Status: DC

## 2021-11-16 MED ORDER — QUETIAPINE FUMARATE 200 MG PO TABS: 200.0000 mg | ORAL_TABLET | Freq: Every day | ORAL | 0 refills | Status: DC

## 2021-11-16 MED ORDER — DONEPEZIL HCL 5 MG PO TABS: 5.0000 mg | ORAL_TABLET | Freq: Every day | ORAL | 1 refills | Status: DC

## 2021-11-16 MED ORDER — LITHIUM CARBONATE ER 450 MG PO TBCR: 450.0000 mg | EXTENDED_RELEASE_TABLET | Freq: Every day | ORAL | 1 refills | Status: DC

## 2021-11-16 MED ORDER — LORAZEPAM 0.5 MG PO TABS: 0.5000 mg | ORAL_TABLET | Freq: Four times a day (QID) | ORAL | 1 refills | Status: DC

## 2021-11-16 MED ORDER — OLANZAPINE 20 MG PO TABS: 20.0000 mg | ORAL_TABLET | Freq: Every day | ORAL | 1 refills | Status: DC

## 2021-11-16 NOTE — Patient Instructions (Addendum)
Start donepezil (Aricept) 1 tablet nightly

## 2021-11-16 NOTE — Progress Notes (Signed)
Albert [name] PT:[id number] Jun 14, 1965 56 y.o.    Subjective:   Patient ID:  Albert [name] is a 56 y.o. (DOB December 14, 1965) male.  Chief Complaint:  Chief Complaint  Patient presents with   Follow-up    Severe bipolar I disorder, current or most recent episode depressed ([place])   Anxiety   Depression   Sleeping Problem   Memory Loss    Depression        Associated symptoms include decreased concentration and suicidal ideas.  Past medical history includes anxiety.   Anxiety Symptoms include decreased concentration, nervous/anxious behavior and suicidal ideas. Patient reports no chest pain, confusion, dizziness or palpitations.      Albert [name] presents to the office today for follow-up of mixed bipolar, anxiety and still grieving stress of breakup.  He requires frequent follow-up because of long-term symptoms.  At visit October 25, 2018.  We made several medicine changes because of his concerns about sleep and other issues.  We increased olanzapine back to 7.5 mg bc not sleeping as well at 5 mg.  He has to have the prescription for quetiapine written for up to 3 tablets at night in case insomnia is worse because insomnia makes his mood disorder so much worse.  We discussed that was above the usual max but the request was granted given his treatment resistant status. We also reduce lithium from 900 mg daily to 750 mg daily to try to reduce tremor and muscle twitches.  At  visit March 28, 2019.  Fluoxetine was increased to 40 mg daily.   Early January 2021 increased to 60 mg daily.  No SE.  seen June 28, 2019.  No further meds were changed except olanzapine was increased back to 10 mg daily to see if if he could get additional benefit per his request.  Covid vaccinated.  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  Then had cholecystectomy.  April 2021 appt with the following noted: 2 episodes night sweats lately.  Memory has been very bad lately.  Repeatedly asks mother questions. Still  tend to stay  in his room and his bed.  Still has rapid cycling mood swings.  Maybe some better with increase in olanzapine to 10 and tolerating itl.  Would like to get out more but can't DT Covid.  Can perform necessary chores.  Will get out of the house when he can.  No change in death thoughts and anxiety in intensity but is better with frequency.  Usually comes and goes in waves but more persistent.  Consistent with meds.  Ativan not helping anxiety very dramatically but he's not sure.  Fidgety.  No trigger other than still grieving relationship and can't get it out of his head.  Poor energy, concentration and more forgetful. In bed more and less active.  Sleep ok lately which is unusual.  Can concentrate on financial matters and stock market at times.  Tolerating meds.  Still some intrusive SI without reason. Less frequent obsessive thoughts about broken relationship and has been doing this for months.  Can't let it go.  Loop.   No unusual stress even with the family who is supportive. Likes the benefit that Zyprexa gives. No sleepwalking nor falling nor odd behavior.  No history of sleep walking.  He understands this may recur with an increase.  Also wants option to rarely take extra quetiapine 300 for sleep prn. Plan:  Try to reduce lorazepam if possible.  10/22/2019 appointment with the following noted:  Continues fluoxetine 60, lithium 600 mg, lorazepam 2 mg AM and HS and 1 mg midday, olanzapine 10, quetiapine 600 mg HS. Still anxious chronically and including driving in crowded spaces.  Doesn't thing Ativan helps as well as Xanax but less cognitive problems. Sleep is not as good.  Occ EFA.  More EMA and wanting to do things in the middle of the night but this is not typical.  Wonders about why that happens.  Some napping.   Tolerating meds.  Asks about weight loss meds.  Disc this in detail.   Plan no changes except OK meclizine prn vertigo.  02/11/20 appt with the following noted: Able to gradually reduce  lorazepam to 1 mg AM and HS. More depressed over time and less interested in things and less interest in going out but does with his parents. Has reduced from 800 to 600 mg HS with some awakening but usually able to go back to sleep.[name]  Spending a good amount of time in bed bc watches TV in bedroom.  Parents watch TV in different part of the house.  No mood swings he notices.   Concerns about weight gain about 200#. Likes olanzapine's benefit for sleep. Plan: Trial for TRD to  Increase fluoxetine to 80 mg daily  to use the combo with olanzapine for TR bipolar depression.   04/21/20 appt with following noted: I thought in beginning some benefit with fluoxetine.  Lifelong negative thinking continues.   Not much bipolar.  Reads a lot on bipolar.   Still tired and anxious a little more may be seasonal.  No familial stressors.  Tends to lay down in afternoon.  Anxiety not over anything in particular.   No SE with fluoxetine. Took meclizine prn.  Easily motion sick.    Asks questions about newer drugs for bipolar like Caplyta. Plan:  No med changes  06/30/20 appt noted: Tired of wearing masks.  Vaccinated.  Asked questions about when this will end with mask mandate.  Mood up and down some since here and getting up 2-3 times.  Disc awakening problems.  Trying to do better bc night eating some.   Mood is about average today so far.  Albert [name] out with friends for breakfast.  Recognizes activity helps mood.   3 days in a row napped a lot in the afternoon.   Bed is a comfort place.  Gets bored and hard to motivate.  Tries to stay away from night eating and spending.   More depressed when alone and wonders about med changes. Plan: Failed response to olanzapine + fluoxetine for TR bipolar depression.   Reduce fluoxetine to 1 daily and reduce olanzapine to one half nightly for 10 days and then stop it. Then start [place] 1 daily  08/27/2020 appointment with the following noted: Patient decompensated with the  above switch to [place] with intrusive suicidal thoughts and had to be hospitalized for psychiatric reasons.  Multiple phone calls with family members since that time.  Spoke with the psychiatric nurse practitioner with the decision to restart olanzapine in place of the [place] given the patient was more stable while on the combination of Seroquel and olanzapine than with the [place]. Caplyta triggered HI/SI and depressed and confused on it, and agitated and still has some of it now. No akathisia. Frustrated with lack of therapy at the hospital.   Hydroxyzine didn't help jitteriness.  Feels some better.  Fleeting SI & HI but not obsessive like it was prior to hospitalization.  Feels a little amped up and nervous.  Scared of what could happen.  Apprehensive being here talking about things. 5-6 hours sleep last night and would like to have more. At mom and dad's house right now and up more in the day. Inadvertently stopped lorazepam abruptly by mistake likely contributing to shakiness. Still some memory issues.  Trying to stay out of bed when watching TV.  Some awakening but managing. Occ fleeting SI and distracts himself.   Always spends a lot of time just laying around.   Can have anxiety for no reason like coming here, and varies in intensity without pattern.      Less panic than in the past.  Some chronic depression and hyperactivity and loudness and hyperverbal.  Usually back to sleep.  Total 6 hours but some napping, awakens 2-4 times nightly but back to sleep..  Taking quetiapine 600 mg HS.  still lacks interest and motivation.  Can follow a TV show if interested. Plan: Restart lorazepam 1 mg in the morning, 1 mg in the afternoon and 1 mg at night for anxiety Stop hydroxyzine  Increase olanzapine to 1 and 1/2 of 10 mg tablets in evening  09/05/2020 appt noted: seen with parents today at his request Made med changes noted and tolerated the changes Better than I did last week. Now only fleeting HI/SI  and "no where near what it was".  Sleeping better.  Still not a lot of energy.  Anhedonia.  Less agitation.  A lot of anxiety all the time but it's helping.  Would like more energy and motivation.   Doesn't handle stress well. Parents note he's less shakey.  He's still staying with his parents.  Only driven once since this happened and F wants him to be able to drive and function more independently.  M agrees he's better.  Memory is better but not normal.  Primarily STM problems No akathisia with olanzapine this time.    Administering his own meds but mo watched. Slept 8 hours last night. Plan consider further reductions in quetiapine  10/17/20 appt noted: Stayed on Seroquel 600 HS and olanzapine 15. Olanzapine seemed to do most for the sleep.  Also helping eliminated HI/SI for the most part.  Wants to try to increase it.  Depression is much better with less rumination also.  Fears akathisia at higher dose as in the past. Also on fluoxetine 40.   Plan: Reduce Seroquel to 1 and 1/2 tablets at night Increase olanzapine to 1 of the 15 mg tablet and 1/2 of the 5 mg tablet for 1 week,  Then, if tolerated increase the olanzapine to 20 mg or 1 of the 15 mg tablets and 1 of the 5 mg tablets. If possible then also reduce quetiapine to 1 tablet at night.  11/05/2020 appointment with the following noted: Up to olanzapine 20 mg hs for 3 days.  Reduced Serooquel to 450 mg HS.  Didn't try to go lower. Had hiatal hernia and umbilical hernia surgery recently with no problem.   No akathisia so far. Sleep ok with meds so far changes.  No mood changes yet but overall likes the smoothness of olanzapine and helping sleep without knociing him out. Still some occ HI/SI, he thinks bc of anxiety generally.  Easily anxious. Plan: Continue olanzapine 20 mg nightly longer to give it time to help mood sx reduce quetiapine to 1 tablet at night to minimize polypharmacy and reduce risk of akathisia.  01/07/21 appt noted: Able  to reduce quetiapine 300 mg HS ok with decent sleep but still wakes a lot.  No AM hangover.  Don't have a lot to do.  Can enjoy going out and doing things.  But doesn't do it longterm.  Family is doing good.  Father started year 69 at his job.   Tolerating the meds well.  May wake more with quetiapine reduction. Lost 10#. Wonders if could try other meds he didn't tolerate before bc now can tolerate olanzapine and couldn't tolerate it in the past DT akathisia. Mood never great but can swing into depresssion without reason.   Plan: Continue olanzapine 20 mg nightly longer to give it time to help mood sx reduce quetiapine to 1/2 of the 300 mg  tablet at night to minimize polypharmacy and reduce risk of akathisia.  02/02/2021 appointment with the following noted: Tried reduced quetiapine to 150 mg and after a week had withdrawal sx including jittery and SI and he increased to 250 mg daily.  Felt better after increasing to 300 mg daily and last week reduced to 250 mg daily.  After increase quetiapine felt better in a few days.  Seems more alert with less quetiapine but can't tolerate a quick withdrawal.   Some awakening.  Plan: Reduce quetiapine by 50 mg every 2-4 weeks.  03/09/2021 phone call: Pt called and said that he is weaning off the seroquel. He was down to 200 mg of the seoquel and he started having withdrawal symptoms and sucidal thoughts. He went back up to 250 mg because he knew he was good on that.    Pt stated he starting back taking 250 mg on Saturday due to the symptoms he was having.Now he is no longer having suicidal thoughts but still very jittery. MD: He may just need to taper slower than most people.  If he can tolerate the 250 mg Seroquel with the jitteriness it should get better over the next 1 to 2 weeks.  Then he can try going down again by 50 mg at a time.  He probably needs to go down by just 50 mg every 2 to 4 weeks and wait till he fully adjusts to each reduction before he reduces  again.  03/17/21 appt noted: Hernia surgery 2 weeks ago. Seroquel 250 mg for a month then 200 mg daily and had SI and increased again to 300 mg daily.  Would love to get off it.  Withdrawal triggers intrusive HI/SI but otherwise doesn't have them No SE. Sleep is OK but not enough REM sleep.  Satisfied with other meds.   No current HI/SI.  No paranoia.  Still has anxiety and taking Ativan every 6 hours.  It works but doesn't cure it. Chronic anxiety and ask about ketamine for chronic depression. Plan: Because he had WD reducing from 250 to 200 mg daily will have to go slowly. Reduce quetiapine by 25 mg every 2-4 weeks. Reduce Seroquel to 250 mg for 2 weeks, Then reduce to 225 mg for 2 weeks, Then reduce to 200 mg for 2 weeks, Then reduce to 175 mg for 2 weeks, Then reduce to 150 mg Also: Buspirone 30 mg tablets for anxiety Start 1/3 tablet twice daily for 1 week Then increase to two thirds twice daily for 1 week Then increase to 1 tablet twice daily  04/15/2021 appointment with the following noted: Reduced Seroquel to 200 mg HS and increased buspirone to 30 mg BID STM px and wants to try to reduce Ativan to  see if it is better.  But fears with drawal sx. Parents complain about his memory. Less HI and SI since here. Less depressed. Plan: Buspirone 30 mg tablets for anxiety Start 1/3 tablet twice daily for 1 week Then increase to two thirds twice daily for 1 week Then increase to 1 tablet twice daily Continue olanzapine 20 mg nightly longer to give it time to help mood sx Because he had WD reducing from 250 to 200 mg daily will have to go slowly. Reduce quetiapine by 25 mg every 2-4 weeks. Reduce Seroquel to 250 mg for 2 weeks, Then reduce to 225 mg for 2 weeks, Then reduce to 200 mg for 2 weeks, Then reduce to 175 mg for 2 weeks, Then reduce to 150 mg Trial taper lorazepam: In hopes of improving short-term memory  04/30/2022 phone call: Complaining of withdrawal symptoms  reducing lorazepam from 0.5 mg 5 times a day to 0.5 mg 4 times daily.  05/21/2021 appointment with the following noted: Gotten onto buspar 30 BID and down on lorazepam to 0.5 mg QID. Gotten down to Seroquel 200 mg HS A little restless every now and then but able to reduce lorazepam for the last weeks. Is too inactive and lays in bed too much watching TV.   Can' t tell effect of buspirone bc of other changes.  Sleep with awakening with 6-7 hours. Fleeting SI without trigger or plan or intent. Depressed more than anxious. Enjoys watching stocks and investing. Plan: Trial taper lorazepam gradually over time In hopes of improving short-term memory But for now continue lorazepam 0.5 mg QID Start clonidine 0.1 mg tablets one half at night for 3 nights, then 1/2 tablet twice daily for 3 nights, then one half in the morning and 1 tablet at night If clonidine does not noticeably help anxiety call the office  05/27/2021 phone call initially complaining of homicidal and suicidal thoughts after taking 1 mg of clonidine.  He was told to stop the medication.  But then called back stating he was having homicidal and suicidal thoughts from withdrawal from the medication which was not logical.  He agreed it was not logical and stated on 05/29/2021 that he felt better  06/23/2021 appointment the following noted: No clonidine. Sunday panic attack eating out and then having SI.  Stayed with father to feel safer. Intrusive thoughts of hurting parents or others.  HI only when has SI.  Usually panic without SI.  Then had intrusive thoughts about hurting others.   Usually intrusive thoughts are brief and fleeting.    07/20/21  appt noted; More anxious with less lithium.  More anxious in his body.  Not more depressed or irritable.  Sleep is the same.  More racing thoughts.  Chronic SI probably a litte worse but not unmanageable. Rduced lithium last week to 300 mg daily. No recent change in lisinopril. Off the  clonidine for a week or so. Doesn't drink much water.  Drinks a lot of diet coke. No marked akathisia or RLS but does shake his leg DT anxiety. Worries about needing mor emedicine. Plan: Increase olanzapine to 25 mg nightly bc more racing thoughts and anxiety with less lithium DT severity of sx and difficulty getting off Seroquel conside rincrease.  07/30/21 TC : CO racing SI today and wonders about whether increased sx racing thoughts, hyperactivity, SI related to Reduced lithium from 600 mg daily to 300 mg daily recently DT lithium level 1.8.  could be related.    Plan:  increase olanzapine 30 mg pm Today take lithium 600 mg now.  Tomorrow start lithium CR 450 mg daily Continue olanzapine 30 mg PM until SI resolves unless akathisia returns.  Meredith Staggers, MD, DFAPA  08/17/21 appt noted:  No akathisia with olanzapine 30 mg pm (about 3 weeks) and is sleeping better. Increased lithium to 450 mg daily. Tolerating meds. Still racing negative thoughts maybe some better.  Asked about it. SI come and go.   Anxiety about the same as depression.  No motivation unless has to do something. Plan: Reduce  fluoxetine 20 mg 1 daily in hopes racing thoughts and anxiety are better.  09/14/2021 appointment with the following noted: No difference noted with reduction fluoxetine 20 mg daily. Most of the time negative thoughts, ex "what if I get in a wreck?"  Frequent negative thoughts.   Still on lorazepam 0.5 mg TID.   Wonders about trying prior meds that caused akathisia bc no longer gets it with olanzapine 30 mg daily. Sleep is usually ok.  Taking Seroquel 200 mg HS.  Thinks the olanzapine helps his sleep and doesn't want to stop it. Panic 1-2 times per month.  Then takes extra lorazepam. Plan: DC fluoxetine 20 mg 1 daily  Trial Auvelity off label for depression 1 in the AM for 1 week then 1 in AM and 1 with evening meal  10/15/2021 appointment with the following noted: Occ of getting confused as to day  and went to church on a Thursday my mistake.   Had it happen a few months ago too.  Memory is terrible. Thinks he couldn't tolerate more Auvelity. Not a lot of benefit from [place] 1 daily. Still quite a bit of anxiety.   A little drowsy  and sleeps 7-8 hours at night. Word finding problems.  Lost wallet. Plan: DC  Auvelity  For [facility]: memantine 1/2 tablet in the AM for 1 week, then 1/2 tablet twice daily, then 1/2 tablet in the AM and 1 tablet at night for 1 week then 1 tablet twice daily  10/22/2021 phone call complaining of additional stress asking to start new medication because of anxiety and more suicidal thoughts without intent or plan.  Reports taking more than 4 prescribed Ativan a day recently.  Asking to increase olanzapine which is already at 30 mg daily. MD response:We cannot go any higher in the dosage of olanzapine that he is currently taking.  A lot of these suicidal thoughts are apparently anxiety driven.  Let us have him resume taking paroxetine which is one of the more effective medicines for anxiety.  I will send it in and the instructions will be paroxetine 20 mg tablets 1/2 tablet daily for 1 week then 1 tablet daily  11/16/21 appt noted: Multiple phone calls since she was here.  Complaining of suicidal thoughts from memantine which was stopped. Started paroxetine and up to 20 mg daily.  Continues olanzapine 30 mg, lithium 450 mg nightly, lorazepam 0.5 mg 4 times daily , And quetiapine 200 mg nightly. Questions about whether memantine caused the neg SI really bc has them at times. When has SI often anxiety driven.  Will tend to ruminate on past relationship ended.   Edgy and irritable all the time but memantine seemed to make it worse. Mind is getting better with less SI lately.   No SE with paroxetine.   Aggrivated by forgetfulness.  eXample, talked with someone about going to breakfast last night and then forgot to go this mornin.g.  Psych med hx extensive including ECT  and  risperidone, Zyprexa 20-15 akathisia, Latuda 80 which caused akathisia, Abilify, Vraylar, Rexulti, aripiprazole 20 mg with akathisia,  Seroquel 1000 mg,  InVega, Geodon, Saphris with side effects, symbyax, Fanapt NR .  Caplyta SE and markedly worse. lithium 1200 SE,  lamotrigine 300 mg, Depakote 2000 mg, Tegretol,Trileptal and several of these in combinations, gabapentin,  N-acetylcysteine, Nuedexta,    Belsomra with no response,  Lunesta no response, trazodone 200 mg,  Xanax, clonazepam, lorazepam less sedation. Buspirone NR Clonidine SE with 2 trials  Viibryd 40 mg for 3 months with diarrhea, protriptyline with side effects,  Trintellix 20 mg,  Parnate 50 mg with no response,  imipramine, venlafaxine,  Emsam 12 mg for 2 months,  bupropion was side effects,  Lexapro 20 mg, sertraline, paroxetine, Deplin, fluoxetine 80 Auvelity NR at one daily.  SE BID  methylphenidate 60 mg,  Vyvanse, Concerta, strattera, , modafinil,  Memantine worsening SI? pramipexole,  amantadine , Patient prone to akathisia.   Review of Systems:  Review of Systems  Cardiovascular:  Negative for chest pain and palpitations.  Musculoskeletal:  Positive for back pain.  Neurological:  Positive for tremors. Negative for dizziness.       Fidgety  Psychiatric/Behavioral:  Positive for decreased concentration and suicidal ideas. Negative for agitation, behavioral problems, confusion, dysphoric mood, hallucinations, self-injury and sleep disturbance. The patient is nervous/anxious and is hyperactive.     Medications: I have reviewed the patient's current medications.  Current Outpatient Medications  Medication Sig Dispense Refill   donepezil (ARICEPT) 5 MG tablet Take 1 tablet (5 mg total) by mouth at bedtime. 30 tablet 1   fenofibrate (TRICOR) 145 MG tablet Take 145 mg by mouth daily.     fluticasone (FLONASE) 50 MCG/ACT nasal spray Place 1 spray into both nostrils daily as needed for allergies or rhinitis.      hydrocortisone (ANUSOL-HC) 2.5 % rectal cream Place 1 application rectally 3 (three) times daily as needed for hemorrhoids.     levothyroxine (SYNTHROID, LEVOTHROID) 75 MCG tablet Take 75 mcg by mouth daily before breakfast.     lisinopril (ZESTRIL) 5 MG tablet Take 5 mg by mouth daily.     meclizine (ANTIVERT) 25 MG tablet Take 1 tablet (25 mg total) by mouth 3 (three) times daily as needed for dizziness. 30 tablet 1   PARoxetine (PAXIL) 20 MG tablet One half daily for 1 week then 1 tablet each morning 30 tablet 1   polycarbophil (FIBERCON) 625 MG tablet Take 1,250 mg by mouth daily.     sildenafil (REVATIO) 20 MG tablet Take 5 tablets (100 mg total) by mouth daily as needed (ED). 30 tablet 11   tamsulosin (FLOMAX) 0.4 MG CAPS capsule Take 1 capsule (0.4 mg total) by mouth daily. 90 capsule 3   acetaminophen (TYLENOL) 500 MG tablet Take 1,000 mg by mouth every 6 (six) hours as needed for moderate pain.     lithium carbonate (ESKALITH) 450 MG CR tablet Take 1 tablet (450 mg total) by mouth at bedtime. 90 tablet 1   LORazepam (ATIVAN) 0.5 MG tablet Take 1 tablet (0.5 mg total) by mouth 4 (four) times daily. 120 tablet 1   OLANZapine (ZYPREXA) 10 MG tablet Take 1 tablet (10 mg total) by mouth at bedtime. 90 tablet 0   OLANZapine (ZYPREXA) 20 MG tablet Take 1 tablet (20 mg total) by mouth at bedtime. 90 tablet 1   QUEtiapine (SEROQUEL) 200 MG tablet Take 1 tablet (  200 mg total) by mouth at bedtime. 90 tablet 0   No current facility-administered medications for this visit.   Medication Side Effects: Other: mild sleepiness.   Occ twitches.\, tremor  Allergies:  Allergies  Allergen Reactions   Meloxicam Other (See Comments)    Other reaction(s): Dizziness   Ibuprofen     Can not take because taking lithium   Prednisone     Can't sleep    Wellbutrin [Bupropion]     Suicide thoughts    Past Medical History:  Diagnosis Date   ADHD (attention deficit hyperactivity disorder)    Anemia     Anxiety    Bipolar disorder (HCC)    BPH (benign prostatic hyperplasia)    Chronic kidney disease, stage 3b (HCC)    DDD (degenerative disc disease), cervical    Depression    Deviated septum    ED (erectile dysfunction)    GERD (gastroesophageal reflux disease)    Graves disease    History of hiatal hernia    Hypertension    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    Hypothyroidism    Pneumonia    PONV (postoperative nausea and vomiting)     Family History  Problem Relation Age of Onset   Prostate cancer Neg Hx    Bladder Cancer Neg Hx    Kidney cancer Neg Hx     Social History   Socioeconomic History   Marital status: Single    Spouse name: Not on file   Number of children: Not on file   Years of education: Not on file   Highest education level: Not on file  Occupational History   Not on file  Tobacco Use   Smoking status: Former    Types: Cigarettes   Smokeless tobacco: Former    Types: Nurse, children's Use: Never used  Substance and Sexual Activity   Alcohol use: Not Currently   Drug use: Never   Sexual activity: Yes  Other Topics Concern   Not on file  Social History Narrative   Not on file   Social Determinants of Health   Financial Resource Strain: Not on file  Food Insecurity: Not on file  Transportation Needs: Not on file  Physical Activity: Not on file  Stress: Not on file  Social Connections: Not on file  Intimate Partner Violence: Not on file    Past Medical History, Surgical history, Social history, and Family history were reviewed and updated as appropriate.   Please see review of systems for further details on the patient's review from today.   Objective:   Physical Exam:  BP 125/73   Pulse (!) 102   Physical Exam Constitutional:      General: He is not in acute distress.    Appearance: He is well-developed.  Musculoskeletal:        General: No deformity.  Neurological:     Mental Status: He is  alert and oriented to person, place, and time.     Cranial Nerves: No dysarthria.     Motor: Tremor present.     Coordination: Coordination normal.     Comments: Slight tremor  Psychiatric:        Attention and Perception: Attention and perception normal. He does not perceive auditory or visual hallucinations.        Mood and Affect: Mood is anxious and depressed. Affect is not blunt, angry, tearful or inappropriate.  Speech: Speech normal. Speech is not slurred.        Behavior: Behavior normal. Behavior is not slowed. Behavior is cooperative.        Thought Content: Thought content is not delusional. Thought content includes suicidal ideation. Thought content does not include homicidal ideation. Thought content does not include suicidal plan.        Cognition and Memory: Cognition normal. He exhibits impaired recent memory.        Judgment: Judgment normal.     Comments: Insight fair He is chronically anxious  worse with less lithium No manic signs noted. Some wordfinding problems ongoing. Intrusive thoughts lately Chronically down but not worse Occ SI but can kick them out     Lab Review:     Component Value Date/Time   NA 139 02/26/2021 1253   NA 142 03/20/2014 0514   K 4.0 02/26/2021 1253   K 4.1 03/20/2014 0514   CL 109 02/26/2021 1253   CL 112 (H) 03/20/2014 0514   CO2 22 02/26/2021 1253   CO2 26 03/20/2014 0514   GLUCOSE 83 02/26/2021 1253   GLUCOSE 99 03/20/2014 0514   BUN 13 02/26/2021 1253   BUN 7 03/20/2014 0514   CREATININE 1.56 (H) 02/26/2021 1253   CREATININE 0.78 03/20/2014 0514   CALCIUM 9.6 02/26/2021 1253   CALCIUM 9.5 03/20/2014 0514   PROT 7.8 08/11/2020 1750   PROT 7.3 03/18/2014 1459   ALBUMIN 4.7 08/11/2020 1750   ALBUMIN 3.2 (L) 03/18/2014 1459   AST 23 08/11/2020 1750   AST 16 03/18/2014 1459   ALT 16 08/11/2020 1750   ALT 16 03/18/2014 1459   ALKPHOS 34 (L) 08/11/2020 1750   ALKPHOS 105 03/18/2014 1459   BILITOT 0.5 08/11/2020 1750    BILITOT 0.3 03/18/2014 1459   GFRNONAA 51 (L) 02/26/2021 1253   GFRNONAA >60 03/20/2014 0514   GFRAA 54 (L) 04/18/2019 1612   GFRAA >60 03/20/2014 0514       Component Value Date/Time   WBC 6.2 02/26/2021 1253   RBC 4.17 (L) 02/26/2021 1253   HGB 13.3 02/26/2021 1253   HGB 12.6 (L) 03/20/2014 0514   HCT 39.4 02/26/2021 1253   HCT 38.4 (L) 03/20/2014 0514   PLT 431 (H) 02/26/2021 1253   PLT 346 03/20/2014 0514   MCV 94.5 02/26/2021 1253   MCV 89 03/20/2014 0514   MCH 31.9 02/26/2021 1253   MCHC 33.8 02/26/2021 1253   RDW 13.1 02/26/2021 1253   RDW 12.5 03/20/2014 0514   LYMPHSABS 3.2 03/20/2014 0514   MONOABS 1.0 03/20/2014 0514   EOSABS 0.4 03/20/2014 0514   BASOSABS 0.1 03/20/2014 0514    Lithium Lvl  Date Value Ref Range Status  08/12/2021 0.9 0.5 - 1.2 mmol/L Final    Comment:    A concentration of 0.5-0.8 mmol/L is advised for long-term use; concentrations of up to 1.2 mmol/L may be necessary during acute treatment.                                  Detection Limit = 0.1                           <0.1 indicates None Detected    08/12/21 lithium level 0.9 on 450 mg.  lithium level Sept 0.8.   lithium level July 27, 2018 was normal at 1.0.   Lithium  level [facility] October 03, 2018 = 1.2. Said he got lithium level at [place] as requested.  Labs not in Epic.  Recent lipids ok except higher TG than usual.  Normal A1C.  No results found for: "PHENYTOIN", "PHENOBARB", "VALPROATE", "CBMZ"   .res Assessment: Plan:    Taelor was seen today for follow-up, anxiety, depression, sleeping problem and memory loss.  Diagnoses and all orders for this visit:  Severe bipolar I disorder, current or most recent episode depressed (HCC) -     QUEtiapine (SEROQUEL) 200 MG tablet; Take 1 tablet (200 mg total) by mouth at bedtime. -     OLANZapine (ZYPREXA) 20 MG tablet; Take 1 tablet (20 mg total) by mouth at bedtime. -     OLANZapine (ZYPREXA) 10 MG tablet; Take 1 tablet (10 mg  total) by mouth at bedtime.  Generalized anxiety disorder -     QUEtiapine (SEROQUEL) 200 MG tablet; Take 1 tablet (200 mg total) by mouth at bedtime. -     OLANZapine (ZYPREXA) 20 MG tablet; Take 1 tablet (20 mg total) by mouth at bedtime. -     LORazepam (ATIVAN) 0.5 MG tablet; Take 1 tablet (0.5 mg total) by mouth 4 (four) times daily.  Panic disorder with agoraphobia -     QUEtiapine (SEROQUEL) 200 MG tablet; Take 1 tablet (200 mg total) by mouth at bedtime. -     OLANZapine (ZYPREXA) 20 MG tablet; Take 1 tablet (20 mg total) by mouth at bedtime. -     LORazepam (ATIVAN) 0.5 MG tablet; Take 1 tablet (0.5 mg total) by mouth 4 (four) times daily.  Insomnia due to mental condition  Mild cognitive impairment -     donepezil (ARICEPT) 5 MG tablet; Take 1 tablet (5 mg total) by mouth at bedtime.  Lithium use  Other orders -     lithium carbonate (ESKALITH) 450 MG CR tablet; Take 1 tablet (450 mg total) by mouth at bedtime.    Chronic TR bipolar mixed and chronic anxiety.  He usually has mixed bipolar symptoms which we have not been able to completely eliminate.  See long list of meds tried.   He is somewhat chronically unstable but better on olanzapine worse if reduces quetiapine quickly.  Discussed mother's concerns about his memory which is primarily a short-term memory issue.  We discussed the alternative options of using Namenda off label for mild cognitive impairment.  We are not yet able to reduce the dose of lorazepam but that could potentially help as well.  His memory was much worse on Xanax than lorazepam. We discussed the short-term risks associated with benzodiazepines including sedation and increased fall risk among others.  Discussed long-term side effect risk including dependence, potential withdrawal symptoms, and the potential eventual dose-related risk of dementia. Disc newer studies refute dementia risks.  Unfortunately due to the severity of set his symptoms polypharmacy  is a necessity.  Disc unusual combo antipsychotics but it helps more than other options.  Discussed potential metabolic side effects associated with atypical antipsychotics, as well as potential risk for movement side effects. Advised pt to contact office if movement side effects occur.  He had some falling around the time when he previously took olanzapine but he took it for several months at this dosage before he had any side effect issues and he was on Xanax at the time that the following occurred. Take LED Seroquel to sleep.   No obvious alternatives for sleep  Lithium being used bc chronic SI  and death thoughts.   Counseled patient regarding potential benefits, risks, and side effects of lithium to include potential risk of lithium affecting thyroid and renal function.  Discussed need for periodic lab monitoring to determine drug level and to assess for potential adverse effects.  Counseled patient regarding signs and symptoms of lithium toxicity and advised that they notify office immediately or seek urgent medical attention if experiencing these signs and symptoms.  Patient advised to contact office with any questions or concerns. Continue lithium  450 mg daily Lithium level 0.7 on 3/115/23 on 300 mg daily Call if death thoughts worsen or worsening SI.   Disc SE. Disc combo with Seroquel may lower response rate but he doesn't think he can sleep without Seroquel.  continue olanzapine to 30 mg nightly  for more racing thoughts and anxiety with less lithium DT severity of sx and difficulty getting off Seroquel conside rincrease.  Consider clozapine.  Disc risk in detail including low WBC with complication, myocarditis.  Extensive discussion of CBC monitoring.  Disc this again.  Disc WD  and may need to go lower before he can stop it. continue Seroquel 200 mg .  Would like to see him taper off if possible.  Continue paroxetine 20 mg daily longer until next appt  But for now continue  lorazepam 0.5 mg QID  We discussed the short-term risks associated with benzodiazepines including sedation and increased fall risk among others.  Discussed long-term side effect risk including dependence, potential withdrawal symptoms, and the potential eventual dose-related risk of dementia.  But recent studies from 2020 dispute this association between benzodiazepines and dementia risk. Newer studies in 2020 do not support an association with dementia.  Discussed safety plan at length with patient.  Advised patient to contact office with any worsening signs and symptoms.  Instructed patient to go to the [facility] emergency room for evaluation if experiencing any acute safety concerns, to include suicidal intent.  Disc Ozempic and Monjauro for weight loss.  Has had weight gain from meds as a contributor. But lately has lost some weight.  Needs to keep up activity as much as possible for depression.  This appt was 30 mins.  FU 4 weeks   Lynder Parents, MD, DFAPA  Future Appointments  Date Time Provider [place]  12/30/2021  2:45 PM Billey Co, MD [facility] None  12/31/2021  1:00 PM Cottle, Billey Co., MD [facility] None    No orders of the defined types were placed in this encounter.      -------------------------------

## 2021-11-17 ENCOUNTER — Other Ambulatory Visit: Payer: Self-pay | Admitting: Psychiatry

## 2021-11-18 ENCOUNTER — Telehealth: Payer: Self-pay | Admitting: Psychiatry

## 2021-11-18 NOTE — Telephone Encounter (Signed)
Pt called stating started new med and around lunch today having suicidal and homicidal thoughts. Took Ativan and went away. Please contact pt @ [phone]. Going out of town this weekend and need to know what to do. ASAP.

## 2021-11-18 NOTE — Telephone Encounter (Signed)
If he thinks the donepezil triggered homicidal and suicidal thoughts then do not take it again.  He gets out of his system quickly so things should settle back down to normal within 24 hours or so.

## 2021-11-18 NOTE — Telephone Encounter (Signed)
Pt informed

## 2021-11-18 NOTE — Telephone Encounter (Signed)
Pt stated he took donepezil Tuesday night and by noon he was having suicidal/homicidal thoughts.He took 2 ativan and it went away,but thoughts were back around 5pm.Pt does not want to take again.

## 2021-12-10 ENCOUNTER — Other Ambulatory Visit: Payer: Self-pay | Admitting: Psychiatry

## 2021-12-18 ENCOUNTER — Other Ambulatory Visit: Payer: Self-pay | Admitting: Psychiatry

## 2021-12-18 DIAGNOSIS — G3184 Mild cognitive impairment, so stated: Secondary | ICD-10-CM

## 2021-12-20 ENCOUNTER — Encounter: Payer: Self-pay | Admitting: Emergency Medicine

## 2021-12-20 ENCOUNTER — Other Ambulatory Visit: Payer: Self-pay

## 2021-12-20 ENCOUNTER — Emergency Department: Payer: Medicare PPO

## 2021-12-20 ENCOUNTER — Emergency Department
Admission: EM | Admit: 2021-12-20 | Discharge: 2021-12-20 | Disposition: A | Discharge status: Home / Self Care | Payer: Medicare PPO | Attending: Emergency Medicine | Admitting: Emergency Medicine

## 2021-12-20 DIAGNOSIS — I129 Hypertensive chronic kidney disease with stage 1 through stage 4 chronic kidney disease, or unspecified chronic kidney disease: Secondary | ICD-10-CM | POA: Diagnosis not present

## 2021-12-20 DIAGNOSIS — E039 Hypothyroidism, unspecified: Secondary | ICD-10-CM | POA: Insufficient documentation

## 2021-12-20 DIAGNOSIS — N189 Chronic kidney disease, unspecified: Secondary | ICD-10-CM | POA: Diagnosis not present

## 2021-12-20 DIAGNOSIS — K5900 Constipation, unspecified: Secondary | ICD-10-CM | POA: Diagnosis present

## 2021-12-20 MED ORDER — MINERAL OIL RE ENEM
1.0000 | ENEMA | Freq: Once | RECTAL | Status: AC
  Administered 2021-12-20: 1 via RECTAL

## 2021-12-20 NOTE — ED Provider Notes (Signed)
[facility] Provider Note    Event Date/Time   First MD Initiated Contact with Patient 12/20/21 [phone]     (approximate)   History   Constipation   HPI  BEATRICE SEHGAL is a 56 y.o. male   presents to the ED with complaint of constipation.  Patient states that he usually goes every 3 to 4 days but has been 7 days since feeling pressure in his lower abdomen.  Patient took MiraLAX this morning and was able to pass "balls".  Patient infrequently takes MiraLAX as he does not like mixing it up.  Patient has history of hypertension, chronic kidney disease, degenerative disc disease, GERD, hiatal hernia, ADHD, bipolar, and hypothyroidism.      Physical Exam   Triage Vital Signs: ED Triage Vitals  Enc Vitals Group     BP 12/20/21 0804 (!) 128/110     Pulse Rate 12/20/21 0804 (!) 101     Resp 12/20/21 0804 18     Temp 12/20/21 0804 98.7 F (37.1 C)     Temp Source 12/20/21 0804 Oral     SpO2 12/20/21 0804 96 %     Weight 12/20/21 0801 188 lb (85.3 kg)     Height 12/20/21 0801 5\' 9"  (1.753 m)     Head Circumference --      Peak Flow --      Pain Score 12/20/21 0801 5     Pain Loc --      Pain Edu? --      Excl. in GC? --     Most recent vital signs: Vitals:   12/20/21 0804 12/20/21 0813  BP: (!) 128/110 134/88  Pulse: (!) 101 99  Resp: 18 18  Temp: 98.7 F (37.1 C)   SpO2: 96% 96%     General: Awake, no distress.  CV:  Good peripheral perfusion.  Resp:  Normal effort.  Abd:  No distention.  Soft, mildly distended, tender diffusely throughout with no point tenderness.  Bowel sounds are noted x4 quadrants Other:     ED Results / Procedures / Treatments   Labs (all labs ordered are listed, but only abnormal results are displayed) Labs Reviewed - No data to display     RADIOLOGY 1 view abdomen images were reviewed and interpreted by myself shows stool burden present.    PROCEDURES:  Critical Care performed:    Procedures   MEDICATIONS ORDERED IN ED: Medications  mineral oil enema 1 enema (1 enema Rectal Given 12/20/21 0918)     IMPRESSION / MDM / ASSESSMENT AND PLAN / ED COURSE  I reviewed the triage vital signs and the nursing notes.   Differential diagnosis includes, but is not limited to, constipation, obstruction, abdominal discomfort.  56 year old male presents to the ED with complaint of constipation.  He states he was told to take MiraLAX which he does not take because he does not like mixing it into liquids.  X-ray shows moderate stool burden.  Patient initially was given a mineral oil enema which did not give much relief.  Patient was then given a soapsuds enema and prior to discharge had moderate amount of stool.  Patient was strongly encouraged to get back on the routine of using his MiraLAX and following up with his PCP.  He was also told that his PCP could refer him to a gastroenterologist if needed.      Patient's presentation is most consistent with acute complicated illness / injury requiring diagnostic workup.  FINAL CLINICAL IMPRESSION(S) / ED DIAGNOSES   Final diagnoses:  Constipation, unspecified constipation type     Rx / DC Orders   ED Discharge Orders     None        Note:  This document was prepared using Dragon voice recognition software and may include unintentional dictation errors.   Tommi Rumps, PA-C 12/20/21 1229    Chesley Noon, MD 12/20/21 [phone]

## 2021-12-20 NOTE — ED Notes (Signed)
See triage note Presents with some abd discomfort   States he feels constipated  Last BM was several days ago

## 2021-12-20 NOTE — ED Notes (Signed)
No changes at present

## 2021-12-20 NOTE — Discharge Instructions (Signed)
You may follow-up with your primary care provider if you continue to have problems with your constipation and also your primary care can refer you to a gastroenterologist if needed.  Begin taking the MiraLAX 1 capful every day to prevent constipation.  You may mix this with any beverage of your choice including coffee.  Drink 8 ounces of water after having this and to stay hydrated.  Increase vegetables and fruits to increase your fiber in your diet.

## 2021-12-20 NOTE — ED Triage Notes (Signed)
Pt reports is constipated and needs some help moving his bowels. Pt reports usually goes every 3-4 days but this time has been 7 days and he feels the pressure in his lower abd

## 2021-12-20 NOTE — ED Notes (Signed)
Pt has evacuated his bowels a couple of times, pausing the soap suds enema

## 2021-12-30 ENCOUNTER — Ambulatory Visit: Payer: Medicare PPO | Admitting: Urology

## 2021-12-30 ENCOUNTER — Encounter: Payer: Self-pay | Admitting: Urology

## 2021-12-30 VITALS — BP 105/71 | HR 111 | Ht 69.0 in | Wt 192.0 lb

## 2021-12-30 DIAGNOSIS — N401 Enlarged prostate with lower urinary tract symptoms: Secondary | ICD-10-CM

## 2021-12-30 DIAGNOSIS — N529 Male erectile dysfunction, unspecified: Secondary | ICD-10-CM | POA: Diagnosis not present

## 2021-12-30 DIAGNOSIS — R3914 Feeling of incomplete bladder emptying: Secondary | ICD-10-CM

## 2021-12-30 DIAGNOSIS — Z125 Encounter for screening for malignant neoplasm of prostate: Secondary | ICD-10-CM | POA: Diagnosis not present

## 2021-12-30 LAB — BLADDER SCAN AMB NON-IMAGING

## 2021-12-30 MED ORDER — TAMSULOSIN HCL 0.4 MG PO CAPS: 0.4000 mg | ORAL_CAPSULE | Freq: Every day | ORAL | 3 refills | Status: DC

## 2021-12-30 MED ORDER — SILDENAFIL CITRATE 100 MG PO TABS: 100.0000 mg | ORAL_TABLET | Freq: Every day | ORAL | 6 refills | Status: DC | PRN

## 2021-12-30 NOTE — Patient Instructions (Signed)

## 2021-12-30 NOTE — Progress Notes (Signed)
   12/30/2021 3:15 PM   Albert [name] 09-May-1965 [id number]  Reason for visit: Follow up BPH, ED, history of microscopic hematuria, PSA screening  HPI: 56 year old male with bipolar disorder who presents for follow-up of the above issues.  He has a long history of persistent microscopic hematuria negative work-up in 2012 with Dr. Achilles Dunk.  He has refused repeat work-ups numerous times.  He denies any gross hematuria.  He is currently taking Flomax 0.4 mg nightly which he feels greatly improves his urinary symptoms.  PVR is normal today at 1 mL.  He has some mild postvoid dribbling that is minimally bothersome.  He has ED that is well controlled with 100 mg Revatio on demand.  PSA May 2022 stable and normal at 0.5.  He has had some problems with constipation which exacerbated his urinary symptoms.  We discussed the relationship between constipation and urination, and strategies discussed.  We briefly discussed outlet procedures like UroLift, he is minimally bothered by his urinary symptoms at this time and would like to continue Flomax.  Flomax and Revatio refilled RTC 1 year PVR, symptom check Can continue PSA screening every other year per guideline recommendation  Sondra Come, MD  [facility] [address] [place], [place] [address] [phone]

## 2021-12-31 ENCOUNTER — Encounter: Payer: Self-pay | Admitting: Psychiatry

## 2021-12-31 ENCOUNTER — Ambulatory Visit (INDEPENDENT_AMBULATORY_CARE_PROVIDER_SITE_OTHER): Payer: Medicare PPO | Admitting: Psychiatry

## 2021-12-31 VITALS — BP 113/43 | HR 109

## 2021-12-31 DIAGNOSIS — R6889 Other general symptoms and signs: Secondary | ICD-10-CM

## 2021-12-31 DIAGNOSIS — F4001 Agoraphobia with panic disorder: Secondary | ICD-10-CM

## 2021-12-31 DIAGNOSIS — G3184 Mild cognitive impairment, so stated: Secondary | ICD-10-CM

## 2021-12-31 DIAGNOSIS — F314 Bipolar disorder, current episode depressed, severe, without psychotic features: Secondary | ICD-10-CM | POA: Diagnosis not present

## 2021-12-31 DIAGNOSIS — F5105 Insomnia due to other mental disorder: Secondary | ICD-10-CM

## 2021-12-31 DIAGNOSIS — Z79899 Other long term (current) drug therapy: Secondary | ICD-10-CM

## 2021-12-31 DIAGNOSIS — F411 Generalized anxiety disorder: Secondary | ICD-10-CM

## 2021-12-31 MED ORDER — RIVASTIGMINE TARTRATE 1.5 MG PO CAPS: 1.5000 mg | ORAL_CAPSULE | Freq: Two times a day (BID) | ORAL | 1 refills | Status: DC

## 2021-12-31 MED ORDER — PAROXETINE HCL 40 MG PO TABS: 40.0000 mg | ORAL_TABLET | Freq: Every day | ORAL | 0 refills | Status: DC

## 2021-12-31 NOTE — Progress Notes (Signed)
Albert [name] [id number] [birth year] 56 y.o.    Subjective:   Patient ID:  Albert [name] is a 56 y.o. (DOB [birth year]) male.  Chief Complaint:  Chief Complaint  Patient presents with   Follow-up    Severe bipolar I disorder, current or most recent episode depressed ([place])   Anxiety   Depression    Depression        Associated symptoms include decreased concentration and suicidal ideas.  Past medical history includes anxiety.   Anxiety Symptoms include decreased concentration, nervous/anxious behavior and suicidal ideas. Patient reports no chest pain, confusion, dizziness or palpitations.      Albert [name] presents to the office today for follow-up of mixed bipolar, anxiety and still grieving stress of breakup.  He requires frequent follow-up because of long-term symptoms.  At visit October 25, 2018.  We made several medicine changes because of his concerns about sleep and other issues.  We increased olanzapine back to 7.5 mg bc not sleeping as well at 5 mg.  He has to have the prescription for quetiapine written for up to 3 tablets at night in case insomnia is worse because insomnia makes his mood disorder so much worse.  We discussed that was above the usual max but the request was granted given his treatment resistant status. We also reduce lithium from 900 mg daily to 750 mg daily to try to reduce tremor and muscle twitches.  At  visit March 28, 2019.  Fluoxetine was increased to 40 mg daily.   Early January 2021 increased to 60 mg daily.  No SE.  seen June 28, 2019.  No further meds were changed except olanzapine was increased back to 10 mg daily to see if if he could get additional benefit per his request.  Covid vaccinated.  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  Then had cholecystectomy.  April 2021 appt with the following noted: 2 episodes night sweats lately.  Memory has been very bad lately.  Repeatedly asks mother questions. Still  tend to stay in his room and his bed.  Still has  rapid cycling mood swings.  Maybe some better with increase in olanzapine to 10 and tolerating itl.  Would like to get out more but can't DT Covid.  Can perform necessary chores.  Will get out of the house when he can.  No change in death thoughts and anxiety in intensity but is better with frequency.  Usually comes and goes in waves but more persistent.  Consistent with meds.  Ativan not helping anxiety very dramatically but he's not sure.  Fidgety.  No trigger other than still grieving relationship and can't get it out of his head.  Poor energy, concentration and more forgetful. In bed more and less active.  Sleep ok lately which is unusual.  Can concentrate on financial matters and stock market at times.  Tolerating meds.  Still some intrusive SI without reason. Less frequent obsessive thoughts about broken relationship and has been doing this for months.  Can't let it go.  Loop.   No unusual stress even with the family who is supportive. Likes the benefit that Zyprexa gives. No sleepwalking nor falling nor odd behavior.  No history of sleep walking.  He understands this may recur with an increase.  Also wants option to rarely take extra quetiapine 300 for sleep prn. Plan:  Try to reduce lorazepam if possible.  10/22/2019 appointment with the following noted: Continues fluoxetine 60, lithium 600 mg, lorazepam 2  mg AM and HS and 1 mg midday, olanzapine 10, quetiapine 600 mg HS. Still anxious chronically and including driving in crowded spaces.  Doesn't thing Ativan helps as well as Xanax but less cognitive problems. Sleep is not as good.  Occ EFA.  More EMA and wanting to do things in the middle of the night but this is not typical.  Wonders about why that happens.  Some napping.   Tolerating meds.  Asks about weight loss meds.  Disc this in detail.   Plan no changes except OK meclizine prn vertigo.  02/11/20 appt with the following noted: Able to gradually reduce lorazepam to 1 mg AM and HS. More  depressed over time and less interested in things and less interest in going out but does with his [name]. Has reduced from 800 to 600 mg HS with some awakening but usually able to go back to sleep.[name]  Spending a good amount of time in bed bc watches TV in bedroom.  [name] watch TV in different part of the house.  No mood swings he notices.   Concerns about weight gain about 200#. Likes olanzapine's benefit for sleep. Plan: Trial for TRD to  Increase fluoxetine to 80 mg daily  to use the combo with olanzapine for TR bipolar depression.   04/21/20 appt with following noted: I thought in beginning some benefit with fluoxetine.  Lifelong negative thinking continues.   Not much bipolar.  Reads a lot on bipolar.   Still tired and anxious a little more may be seasonal.  No familial stressors.  Tends to lay down in afternoon.  Anxiety not over anything in particular.   No SE with fluoxetine. Took meclizine prn.  Easily motion sick.    Asks questions about newer drugs for bipolar like Caplyta. Plan:  No med changes  06/30/20 appt noted: Tired of wearing masks.  Vaccinated.  Asked questions about when this will end with mask mandate.  Mood up and down some since here and getting up 2-3 times.  Disc awakening problems.  Trying to do better bc night eating some.   Mood is about average today so far.  Albert [name] out with friends for breakfast.  Recognizes activity helps mood.   3 days in a row napped a lot in the afternoon.   Bed is a comfort place.  Gets bored and hard to motivate.  Tries to stay away from night eating and spending.   More depressed when alone and wonders about med changes. Plan: Failed response to olanzapine + fluoxetine for TR bipolar depression.   Reduce fluoxetine to 1 daily and reduce olanzapine to one half nightly for 10 days and then stop it. Then start Caplyta 1 daily  08/27/2020 appointment with the following noted: Patient decompensated with the above switch to Caplyta with  intrusive suicidal thoughts and had to be hospitalized for psychiatric reasons.  Multiple phone calls with family members since that time.  Spoke with the psychiatric nurse practitioner with the decision to restart olanzapine in place of the Caplyta given the patient was more stable while on the combination of Seroquel and olanzapine than with the Caplyta. Caplyta triggered HI/SI and depressed and confused on it, and agitated and still has some of it now. No akathisia. Frustrated with lack of therapy at the hospital.   Hydroxyzine didn't help jitteriness.  Feels some better.  Fleeting SI & HI but not obsessive like it was prior to hospitalization.  Feels a little amped up and nervous.  Scared of what could happen.  Apprehensive being here talking about things. 5-6 hours sleep last night and would like to have more. At mom and dad's house right now and up more in the day. Inadvertently stopped lorazepam abruptly by mistake likely contributing to shakiness. Still some memory issues.  Trying to stay out of bed when watching TV.  Some awakening but managing. Occ fleeting SI and distracts himself.   Always spends a lot of time just laying around.   Can have anxiety for no reason like coming here, and varies in intensity without pattern.      Less panic than in the past.  Some chronic depression and hyperactivity and loudness and hyperverbal.  Usually back to sleep.  Total 6 hours but some napping, awakens 2-4 times nightly but back to sleep..  Taking quetiapine 600 mg HS.  still lacks interest and motivation.  Can follow a TV show if interested. Plan: Restart lorazepam 1 mg in the morning, 1 mg in the afternoon and 1 mg at night for anxiety Stop hydroxyzine  Increase olanzapine to 1 and 1/2 of 10 mg tablets in evening  09/05/2020 appt noted: seen with [name] today at his request Made med changes noted and tolerated the changes Better than I did last week. Now only fleeting HI/SI and "no where near what it  was".  Sleeping better.  Still not a lot of energy.  Anhedonia.  Less agitation.  A lot of anxiety all the time but it's helping.  Would like more energy and motivation.   Doesn't handle stress well. [name] note he's less shakey.  He's still staying with his [name].  Only driven once since this happened and F wants him to be able to drive and function more independently.  M agrees he's better.  Memory is better but not normal.  Primarily STM problems No akathisia with olanzapine this time.    Administering his own meds but mo watched. Slept 8 hours last night. Plan consider further reductions in quetiapine  10/17/20 appt noted: Stayed on Seroquel 600 HS and olanzapine 15. Olanzapine seemed to do most for the sleep.  Also helping eliminated HI/SI for the most part.  Wants to try to increase it.  Depression is much better with less rumination also.  Fears akathisia at higher dose as in the past. Also on fluoxetine 40.   Plan: Reduce Seroquel to 1 and 1/2 tablets at night Increase olanzapine to 1 of the 15 mg tablet and 1/2 of the 5 mg tablet for 1 week,  Then, if tolerated increase the olanzapine to 20 mg or 1 of the 15 mg tablets and 1 of the 5 mg tablets. If possible then also reduce quetiapine to 1 tablet at night.  11/05/2020 appointment with the following noted: Up to olanzapine 20 mg hs for 3 days.  Reduced Serooquel to 450 mg HS.  Didn't try to go lower. Had hiatal hernia and umbilical hernia surgery recently with no problem.   No akathisia so far. Sleep ok with meds so far changes.  No mood changes yet but overall likes the smoothness of olanzapine and helping sleep without knociing him out. Still some occ HI/SI, he thinks bc of anxiety generally.  Easily anxious. Plan: Continue olanzapine 20 mg nightly longer to give it time to help mood sx reduce quetiapine to 1 tablet at night to minimize polypharmacy and reduce risk of akathisia.  01/07/21 appt noted: Able to reduce quetiapine 300 mg  HS ok with  decent sleep but still wakes a lot.  No AM hangover.  Don't have a lot to do.  Can enjoy going out and doing things.  But doesn't do it longterm.  Family is doing good.  Father started year 55 at his job.   Tolerating the meds well.  May wake more with quetiapine reduction. Lost 10#. Wonders if could try other meds he didn't tolerate before bc now can tolerate olanzapine and couldn't tolerate it in the past DT akathisia. Mood never great but can swing into depresssion without reason.   Plan: Continue olanzapine 20 mg nightly longer to give it time to help mood sx reduce quetiapine to 1/2 of the 300 mg  tablet at night to minimize polypharmacy and reduce risk of akathisia.  02/02/2021 appointment with the following noted: Tried reduced quetiapine to 150 mg and after a week had withdrawal sx including jittery and SI and he increased to 250 mg daily.  Felt better after increasing to 300 mg daily and last week reduced to 250 mg daily.  After increase quetiapine felt better in a few days.  Seems more alert with less quetiapine but can't tolerate a quick withdrawal.   Some awakening.  Plan: Reduce quetiapine by 50 mg every 2-4 weeks.  03/09/2021 phone call: Pt called and said that he is weaning off the seroquel. He was down to 200 mg of the seoquel and he started having withdrawal symptoms and sucidal thoughts. He went back up to 250 mg because he knew he was good on that.    Pt stated he starting back taking 250 mg on Saturday due to the symptoms he was having.Now he is no longer having suicidal thoughts but still very jittery. MD: He may just need to taper slower than most people.  If he can tolerate the 250 mg Seroquel with the jitteriness it should get better over the next 1 to 2 weeks.  Then he can try going down again by 50 mg at a time.  He probably needs to go down by just 50 mg every 2 to 4 weeks and wait till he fully adjusts to each reduction before he reduces again.  03/17/21 appt  noted: Hernia surgery 2 weeks ago. Seroquel 250 mg for a month then 200 mg daily and had SI and increased again to 300 mg daily.  Would love to get off it.  Withdrawal triggers intrusive HI/SI but otherwise doesn't have them No SE. Sleep is OK but not enough REM sleep.  Satisfied with other meds.   No current HI/SI.  No paranoia.  Still has anxiety and taking Ativan every 6 hours.  It works but doesn't cure it. Chronic anxiety and ask about ketamine for chronic depression. Plan: Because he had WD reducing from 250 to 200 mg daily will have to go slowly. Reduce quetiapine by 25 mg every 2-4 weeks. Reduce Seroquel to 250 mg for 2 weeks, Then reduce to 225 mg for 2 weeks, Then reduce to 200 mg for 2 weeks, Then reduce to 175 mg for 2 weeks, Then reduce to 150 mg Also: Buspirone 30 mg tablets for anxiety Start 1/3 tablet twice daily for 1 week Then increase to two thirds twice daily for 1 week Then increase to 1 tablet twice daily  04/15/2021 appointment with the following noted: Reduced Seroquel to 200 mg HS and increased buspirone to 30 mg BID STM px and wants to try to reduce Ativan to see if it is better.  But fears  with drawal sx. [name] complain about his memory. Less HI and SI since here. Less depressed. Plan: Buspirone 30 mg tablets for anxiety Start 1/3 tablet twice daily for 1 week Then increase to two thirds twice daily for 1 week Then increase to 1 tablet twice daily Continue olanzapine 20 mg nightly longer to give it time to help mood sx Because he had WD reducing from 250 to 200 mg daily will have to go slowly. Reduce quetiapine by 25 mg every 2-4 weeks. Reduce Seroquel to 250 mg for 2 weeks, Then reduce to 225 mg for 2 weeks, Then reduce to 200 mg for 2 weeks, Then reduce to 175 mg for 2 weeks, Then reduce to 150 mg Trial taper lorazepam: In hopes of improving short-term memory  04/30/2022 phone call: Complaining of withdrawal symptoms reducing lorazepam from 0.5  mg 5 times a day to 0.5 mg 4 times daily.  05/21/2021 appointment with the following noted: Gotten onto buspar 30 BID and down on lorazepam to 0.5 mg QID. Gotten down to Seroquel 200 mg HS A little restless every now and then but able to reduce lorazepam for the last weeks. Is too inactive and lays in bed too much watching TV.   Can' t tell effect of buspirone bc of other changes.  Sleep with awakening with 6-7 hours. Fleeting SI without trigger or plan or intent. Depressed more than anxious. Enjoys watching stocks and investing. Plan: Trial taper lorazepam gradually over time In hopes of improving short-term memory But for now continue lorazepam 0.5 mg QID Start clonidine 0.1 mg tablets one half at night for 3 nights, then 1/2 tablet twice daily for 3 nights, then one half in the morning and 1 tablet at night If clonidine does not noticeably help anxiety call the office  05/27/2021 phone call initially complaining of homicidal and suicidal thoughts after taking 1 mg of clonidine.  He was told to stop the medication.  But then called back stating he was having homicidal and suicidal thoughts from withdrawal from the medication which was not logical.  He agreed it was not logical and stated on 05/29/2021 that he felt better  06/23/2021 appointment the following noted: No clonidine. Sunday panic attack eating out and then having SI.  Stayed with father to feel safer. Intrusive thoughts of hurting [name] or others.  HI only when has SI.  Usually panic without SI.  Then had intrusive thoughts about hurting others.   Usually intrusive thoughts are brief and fleeting.    07/20/21  appt noted; More anxious with less lithium.  More anxious in his body.  Not more depressed or irritable.  Sleep is the same.  More racing thoughts.  Chronic SI probably a litte worse but not unmanageable. Rduced lithium last week to 300 mg daily. No recent change in lisinopril. Off the clonidine for a week or so. Doesn't  drink much water.  Drinks a lot of diet coke. No marked akathisia or RLS but does shake his leg DT anxiety. Worries about needing mor emedicine. Plan: Increase olanzapine to 25 mg nightly bc more racing thoughts and anxiety with less lithium DT severity of sx and difficulty getting off Seroquel conside rincrease.  07/30/21 TC : CO racing SI today and wonders about whether increased sx racing thoughts, hyperactivity, SI related to Reduced lithium from 600 mg daily to 300 mg daily recently DT lithium level 1.8.  could be related.    Plan: increase olanzapine 30 mg pm Today take lithium  600 mg now.  Tomorrow start lithium CR 450 mg daily Continue olanzapine 30 mg PM until SI resolves unless akathisia returns.  Albert Parents, MD, DFAPA  08/17/21 appt noted:  No akathisia with olanzapine 30 mg pm (about 3 weeks) and is sleeping better. Increased lithium to 450 mg daily. Tolerating meds. Still racing negative thoughts maybe some better.  Asked about it. SI come and go.   Anxiety about the same as depression.  No motivation unless has to do something. Plan: Reduce  fluoxetine 20 mg 1 daily in hopes racing thoughts and anxiety are better.  09/14/2021 appointment with the following noted: No difference noted with reduction fluoxetine 20 mg daily. Most of the time negative thoughts, ex "what if I get in a wreck?"  Frequent negative thoughts.   Still on lorazepam 0.5 mg TID.   Wonders about trying prior meds that caused akathisia bc no longer gets it with olanzapine 30 mg daily. Sleep is usually ok.  Taking Seroquel 200 mg HS.  Thinks the olanzapine helps his sleep and doesn't want to stop it. Panic 1-2 times per month.  Then takes extra lorazepam. Plan: DC fluoxetine 20 mg 1 daily  Trial Auvelity off label for depression 1 in the AM for 1 week then 1 in AM and 1 with evening meal  10/15/2021 appointment with the following noted: Occ of getting confused as to day and went to church on a Thursday my  mistake.   Had it happen a few months ago too.  Memory is terrible. Thinks he couldn't tolerate more Auvelity. Not a lot of benefit from [place] 1 daily. Still quite a bit of anxiety.   A little drowsy  and sleeps 7-8 hours at night. Word finding problems.  Lost wallet. Plan: DC  Auvelity  For [facility]: memantine 1/2 tablet in the AM for 1 week, then 1/2 tablet twice daily, then 1/2 tablet in the AM and 1 tablet at night for 1 week then 1 tablet twice daily  10/22/2021 phone call complaining of additional stress asking to start new medication because of anxiety and more suicidal thoughts without intent or plan.  Reports taking more than 4 prescribed Ativan a day recently.  Asking to increase olanzapine which is already at 30 mg daily. MD response:We cannot go any higher in the dosage of olanzapine that he is currently taking.  A lot of these suicidal thoughts are apparently anxiety driven.  Let us have him resume taking paroxetine which is one of the more effective medicines for anxiety.  I will send it in and the instructions will be paroxetine 20 mg tablets 1/2 tablet daily for 1 week then 1 tablet daily  11/16/21 appt noted: Multiple phone calls since she was here.  Complaining of suicidal thoughts from memantine which was stopped. Started paroxetine and up to 20 mg daily.  Continues olanzapine 30 mg, lithium 450 mg nightly, lorazepam 0.5 mg 4 times daily , And quetiapine 200 mg nightly. Questions about whether memantine caused the neg SI really bc has them at times. When has SI often anxiety driven.  Will tend to ruminate on past relationship ended.   Edgy and irritable all the time but memantine seemed to make it worse. Mind is getting better with less SI lately.   No SE with paroxetine.   Aggrivated by forgetfulness.  eXample, talked with someone about going to breakfast last night and then forgot to go this mornin.g. Plan prescribed donepezil 5 mg daily for cognitive complaints  specifically  forgetfulness  12/31/2021 appointment with the following noted: He had been prescribed donepezil 5 mg for cognitive complaints.  He called back reporting he felt it was causing homicidal thoughts and was instructed to stop it.  He has had these types of thoughts in the past.  He had no desire to act on them. Discouraged. Ongoing depression and reduced enjoyment and negative thoughts.  Can enjoy some things. A lot of anxiety chronically.  Trying to limit Ativan to 5 of 0.5 mg daily.  Sometimes needs 2. Constipation for years.  Psych med hx extensive including ECT and  risperidone, Zyprexa 20-15 akathisia, Latuda 80 which caused akathisia,  Vraylar, Rexulti, aripiprazole 20 mg with akathisia,  Seroquel 1000 mg,  InVega, Geodon, Saphris with side effects, symbyax, Fanapt NR .  Caplyta SE and markedly worse. lithium 1200 SE,  lamotrigine 300 mg, Depakote 2000 mg, Tegretol,Trileptal and several of these in combinations, gabapentin,  N-acetylcysteine, Nuedexta,     Belsomra with no response,  Lunesta no response, trazodone 200 mg,  Xanax, clonazepam, lorazepam less sedation. Buspirone NR Clonidine SE with 2 trials  Viibryd 40 mg for 3 months with diarrhea, protriptyline with side effects,  Trintellix 20 mg,  Parnate 50 mg with no response,   imipramine, venlafaxine,  Emsam 12 mg for 2 months,   bupropion was side effects,  Lexapro 20 mg, sertraline, paroxetine, Deplin, fluoxetine 80 Auvelity NR at one daily.  SE BID  methylphenidate 60 mg,  Vyvanse, Concerta, strattera, , modafinil,  Memantine worsening SI? Donepezil, complained of HI pramipexole,  amantadine ,  Patient prone to akathisia.   Review of Systems:  Review of Systems  Cardiovascular:  Negative for chest pain and palpitations.  Musculoskeletal:  Positive for back pain.  Neurological:  Positive for tremors. Negative for dizziness.       Fidgety  Psychiatric/Behavioral:  Positive for decreased concentration and suicidal  ideas. Negative for agitation, behavioral problems, confusion, dysphoric mood, hallucinations, self-injury and sleep disturbance. The patient is nervous/anxious. The patient is not hyperactive.     Medications: I have reviewed the patient's current medications.  Current Outpatient Medications  Medication Sig Dispense Refill   acetaminophen (TYLENOL) 500 MG tablet Take 1,000 mg by mouth every 6 (six) hours as needed for moderate pain.     fenofibrate (TRICOR) 145 MG tablet Take 145 mg by mouth daily.     fluticasone (FLONASE) 50 MCG/ACT nasal spray Place 1 spray into both nostrils daily as needed for allergies or rhinitis.     hydrocortisone (ANUSOL-HC) 2.5 % rectal cream Place 1 application rectally 3 (three) times daily as needed for hemorrhoids.     levothyroxine (SYNTHROID, LEVOTHROID) 75 MCG tablet Take 75 mcg by mouth daily before breakfast.     lisinopril (ZESTRIL) 5 MG tablet Take 5 mg by mouth daily.     lithium carbonate (ESKALITH) 450 MG CR tablet Take 1 tablet (450 mg total) by mouth at bedtime. 90 tablet 1   LORazepam (ATIVAN) 0.5 MG tablet Take 1 tablet (0.5 mg total) by mouth 4 (four) times daily. (Patient taking differently: Take 0.5 mg by mouth 4 (four) times daily. Sometimes may take 5-6 tabs per day) 120 tablet 1   meclizine (ANTIVERT) 25 MG tablet Take 1 tablet (25 mg total) by mouth 3 (three) times daily as needed for dizziness. 30 tablet 1   memantine (NAMENDA) 10 MG tablet Take by mouth.     OLANZapine (ZYPREXA) 10 MG tablet Take 1 tablet (10 mg  total) by mouth at bedtime. 90 tablet 0   OLANZapine (ZYPREXA) 20 MG tablet Take 1 tablet (20 mg total) by mouth at bedtime. 90 tablet 1   polycarbophil (FIBERCON) 625 MG tablet Take 1,250 mg by mouth daily.     QUEtiapine (SEROQUEL) 200 MG tablet Take 1 tablet (200 mg total) by mouth at bedtime. 90 tablet 0   rivastigmine (EXELON) 1.5 MG capsule Take 1 capsule (1.5 mg total) by mouth 2 (two) times daily. 60 capsule 1   sildenafil  (VIAGRA) 100 MG tablet Take 1 tablet (100 mg total) by mouth daily as needed for erectile dysfunction. 30 tablet 6   tamsulosin (FLOMAX) 0.4 MG CAPS capsule Take 1 capsule (0.4 mg total) by mouth daily. 90 capsule 3   PARoxetine (PAXIL) 40 MG tablet Take 1 tablet (40 mg total) by mouth daily. 90 tablet 0   No current facility-administered medications for this visit.   Medication Side Effects: Other: mild sleepiness.   Occ twitches.\, tremor  Allergies:  Allergies  Allergen Reactions   Meloxicam Other (See Comments)    Other reaction(s): Dizziness   Nsaids     Other reaction(s): Hallucination, Other (See Comments)   Ibuprofen     Can not take because taking lithium   Prednisone     Can't sleep    Wellbutrin [Bupropion]     Suicide thoughts    Past Medical History:  Diagnosis Date   ADHD (attention deficit hyperactivity disorder)    Anemia    Anxiety    Bipolar disorder (HCC)    BPH (benign prostatic hyperplasia)    Chronic kidney disease, stage 3b (HCC)    DDD (degenerative disc disease), cervical    Depression    Deviated septum    ED (erectile dysfunction)    GERD (gastroesophageal reflux disease)    Graves disease    History of hiatal hernia    Hypertension    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    Hypothyroidism    Pneumonia    PONV (postoperative nausea and vomiting)     Family History  Problem Relation Age of Onset   Prostate cancer Neg Hx    Bladder Cancer Neg Hx    Kidney cancer Neg Hx     Social History   Socioeconomic History   Marital status: Single    Spouse name: Not on file   Number of children: Not on file   Years of education: Not on file   Highest education level: Not on file  Occupational History   Not on file  Tobacco Use   Smoking status: Former    Types: Cigarettes    Passive exposure: Past   Smokeless tobacco: Former    Types: Nurse, children's Use: Never used  Substance and Sexual Activity    Alcohol use: Not Currently   Drug use: Never   Sexual activity: Yes  Other Topics Concern   Not on file  Social History Narrative   Not on file   Social Determinants of Health   Financial Resource Strain: Not on file  Food Insecurity: Not on file  Transportation Needs: Not on file  Physical Activity: Not on file  Stress: Not on file  Social Connections: Not on file  Intimate Partner Violence: Not on file    Past Medical History, Surgical history, Social history, and Family history were reviewed and updated as appropriate.   Please see review of systems for further details  on the patient's review from today.   Objective:   Physical Exam:  BP (!) 113/43   Pulse (!) 109   Physical Exam Constitutional:      General: He is not in acute distress.    Appearance: He is well-developed.  Musculoskeletal:        General: No deformity.  Neurological:     Mental Status: He is alert and oriented to person, place, and time.     Cranial Nerves: No dysarthria.     Motor: Tremor present.     Coordination: Coordination normal.     Comments: Slight tremor  Psychiatric:        Attention and Perception: Attention and perception normal. He does not perceive auditory or visual hallucinations.        Mood and Affect: Mood is anxious and depressed. Affect is not blunt, angry, tearful or inappropriate.        Speech: Speech normal. Speech is not slurred.        Behavior: Behavior normal. Behavior is not slowed. Behavior is cooperative.        Thought Content: Thought content is not delusional. Thought content includes suicidal ideation. Thought content does not include homicidal ideation. Thought content does not include suicidal plan.        Cognition and Memory: Cognition normal. He exhibits impaired recent memory.        Judgment: Judgment normal.     Comments: Insight fair He is chronically anxious  worse with less lithium No manic signs noted. Some wordfinding problems  ongoing. Intrusive thoughts lately Chronically down maybe worse Occ SI but can kick them out     Lab Review:     Component Value Date/Time   NA 139 02/26/2021 1253   NA 142 03/20/2014 0514   K 4.0 02/26/2021 1253   K 4.1 03/20/2014 0514   CL 109 02/26/2021 1253   CL 112 (H) 03/20/2014 0514   CO2 22 02/26/2021 1253   CO2 26 03/20/2014 0514   GLUCOSE 83 02/26/2021 1253   GLUCOSE 99 03/20/2014 0514   BUN 13 02/26/2021 1253   BUN 7 03/20/2014 0514   CREATININE 1.56 (H) 02/26/2021 1253   CREATININE 0.78 03/20/2014 0514   CALCIUM 9.6 02/26/2021 1253   CALCIUM 9.5 03/20/2014 0514   PROT 7.8 08/11/2020 1750   PROT 7.3 03/18/2014 1459   ALBUMIN 4.7 08/11/2020 1750   ALBUMIN 3.2 (L) 03/18/2014 1459   AST 23 08/11/2020 1750   AST 16 03/18/2014 1459   ALT 16 08/11/2020 1750   ALT 16 03/18/2014 1459   ALKPHOS 34 (L) 08/11/2020 1750   ALKPHOS 105 03/18/2014 1459   BILITOT 0.5 08/11/2020 1750   BILITOT 0.3 03/18/2014 1459   GFRNONAA 51 (L) 02/26/2021 1253   GFRNONAA >60 03/20/2014 0514   GFRAA 54 (L) 04/18/2019 1612   GFRAA >60 03/20/2014 0514       Component Value Date/Time   WBC 6.2 02/26/2021 1253   RBC 4.17 (L) 02/26/2021 1253   HGB 13.3 02/26/2021 1253   HGB 12.6 (L) 03/20/2014 0514   HCT 39.4 02/26/2021 1253   HCT 38.4 (L) 03/20/2014 0514   PLT 431 (H) 02/26/2021 1253   PLT 346 03/20/2014 0514   MCV 94.5 02/26/2021 1253   MCV 89 03/20/2014 0514   MCH 31.9 02/26/2021 1253   MCHC 33.8 02/26/2021 1253   RDW 13.1 02/26/2021 1253   RDW 12.5 03/20/2014 0514   LYMPHSABS 3.2 03/20/2014 0514   MONOABS 1.0  03/20/2014 0514   EOSABS 0.4 03/20/2014 0514   BASOSABS 0.1 03/20/2014 0514    Lithium Lvl  Date Value Ref Range Status  08/12/2021 0.9 0.5 - 1.2 mmol/L Final    Comment:    A concentration of 0.5-0.8 mmol/L is advised for long-term use; concentrations of up to 1.2 mmol/L may be necessary during acute treatment.                                  Detection Limit  = 0.1                           <0.1 indicates None Detected    08/12/21 lithium level 0.9 on 450 mg.  lithium level Sept 0.8.   lithium level July 27, 2018 was normal at 1.0.   Lithium level [facility] October 03, 2018 = 1.2. Said he got lithium level at [place] as requested.  Labs not in Epic.  Recent lipids ok except higher TG than usual.  Normal A1C.  No results found for: "PHENYTOIN", "PHENOBARB", "VALPROATE", "CBMZ"   .res Assessment: Plan:    Braian was seen today for follow-up, anxiety and depression.  Diagnoses and all orders for this visit:  Severe bipolar I disorder, current or most recent episode depressed (HCC) -     PARoxetine (PAXIL) 40 MG tablet; Take 1 tablet (40 mg total) by mouth daily.  Mild cognitive impairment -     rivastigmine (EXELON) 1.5 MG capsule; Take 1 capsule (1.5 mg total) by mouth 2 (two) times daily.  Generalized anxiety disorder  Panic disorder with agoraphobia  Insomnia due to mental condition  Lithium use  Forgetfulness    Chronic TR bipolar mixed and chronic anxiety.  He usually has mixed bipolar symptoms which we have not been able to completely eliminate.  See long list of meds tried.   He is somewhat chronically unstable but better on olanzapine worse if reduces quetiapine quickly.  Discussed mother's concerns about his memory which is primarily a short-term memory issue.  We discussed the alternative options of using Namenda off label for mild cognitive impairment.  We are not yet able to reduce the dose of lorazepam but that could potentially help as well.  His memory was much worse on Xanax than lorazepam. We discussed the short-term risks associated with benzodiazepines including sedation and increased fall risk among others.  Discussed long-term side effect risk including dependence, potential withdrawal symptoms, and the potential eventual dose-related risk of dementia. Disc newer studies refute dementia risks.  Unfortunately due to the  severity of set his symptoms polypharmacy is a necessity.  Disc unusual combo antipsychotics but it helps more than other options.  Discussed potential metabolic side effects associated with atypical antipsychotics, as well as potential risk for movement side effects. Advised pt to contact office if movement side effects occur.  He had some falling around the time when he previously took olanzapine but he took it for several months at this dosage before he had any side effect issues and he was on Xanax at the time that the following occurred. Take LED Seroquel to sleep.   No obvious alternatives for sleep  Lithium being used bc chronic SI and death thoughts.   Counseled patient regarding potential benefits, risks, and side effects of lithium to include potential risk of lithium affecting thyroid and renal function.  Discussed need for periodic  lab monitoring to determine drug level and to assess for potential adverse effects.  Counseled patient regarding signs and symptoms of lithium toxicity and advised that they notify office immediately or seek urgent medical attention if experiencing these signs and symptoms.  Patient advised to contact office with any questions or concerns. Continue lithium  450 mg daily Lithium level 0.7 on 3/115/23 on 300 mg daily Call if death thoughts worsen or worsening SI.   Disc SE. Disc combo with Seroquel may lower response rate but he doesn't think he can sleep without Seroquel.  continue olanzapine to 30 mg nightly  for more racing thoughts and anxiety with less lithium DT severity of sx and difficulty getting off Seroquel conside rincrease.  Consider clozapine.  Disc risk in detail including low WBC with complication, myocarditis.  Extensive discussion of CBC monitoring.  Disc this again.  Disc WD  and may need to go lower before he can stop it. continue Seroquel 200 mg .  Would like to see him taper off if possible.  increase paroxetine 40 mg daily for  depression and anxiety DT both being TR Consider fluvoxamine Did he take duloxetine?  Might help depression more  But for now continue lorazepam 0.5 mg QID, but use LED for cog reasons  We discussed the short-term risks associated with benzodiazepines including sedation and increased fall risk among others.  Discussed long-term side effect risk including dependence, potential withdrawal symptoms, and the potential eventual dose-related risk of dementia.  But recent studies from 2020 dispute this association between benzodiazepines and dementia risk. Newer studies in 2020 do not support an association with dementia.  Discussed safety plan at length with patient.  Advised patient to contact office with any worsening signs and symptoms.  Instructed patient to go to the [facility] emergency room for evaluation if experiencing any acute safety concerns, to include suicidal intent.  Disc Ozempic and Monjauro for weight loss.  Has had weight gain from meds as a contributor. But lately has lost some weight.  Disc Dr. Ardelia Mems mentioned evidence it could help depression  Needs to keep up activity as much as possible for depression.  Take Miralax daily for constipation.  Could be SE Seroquel.  This appt was 30 mins.  FU 4 weeks   Albert Parents, MD, DFAPA  Future Appointments  Date Time Provider [place]  02/03/2022  1:30 PM Cottle, Billey Co., MD [facility] None  12/30/2022  2:30 PM Billey Co, MD [facility] None    No orders of the defined types were placed in this encounter.      -------------------------------

## 2021-12-31 NOTE — Patient Instructions (Signed)
Increase paroxetine to 40 mg daily as either one of the 40 mg tablets or 2 of the 20 mg tablets For memory start rivastigmine 1 capsule twice daily Talk with primary care doctor about possibly getting Wegovy or Mounjaro as not only a treatment for obesity but also its potential benefit for depression given the failure of other medications to help depression

## 2022-01-06 ENCOUNTER — Other Ambulatory Visit: Payer: Self-pay | Admitting: Psychiatry

## 2022-01-06 DIAGNOSIS — F314 Bipolar disorder, current episode depressed, severe, without psychotic features: Secondary | ICD-10-CM

## 2022-02-01 ENCOUNTER — Other Ambulatory Visit: Payer: Self-pay | Admitting: Psychiatry

## 2022-02-01 DIAGNOSIS — G3184 Mild cognitive impairment, so stated: Secondary | ICD-10-CM

## 2022-02-01 DIAGNOSIS — F314 Bipolar disorder, current episode depressed, severe, without psychotic features: Secondary | ICD-10-CM

## 2022-02-03 ENCOUNTER — Encounter: Payer: Self-pay | Admitting: Psychiatry

## 2022-02-03 ENCOUNTER — Ambulatory Visit (INDEPENDENT_AMBULATORY_CARE_PROVIDER_SITE_OTHER): Payer: Medicare PPO | Admitting: Psychiatry

## 2022-02-03 VITALS — BP 120/78 | HR 96

## 2022-02-03 DIAGNOSIS — F314 Bipolar disorder, current episode depressed, severe, without psychotic features: Secondary | ICD-10-CM

## 2022-02-03 DIAGNOSIS — F5105 Insomnia due to other mental disorder: Secondary | ICD-10-CM

## 2022-02-03 DIAGNOSIS — G3184 Mild cognitive impairment, so stated: Secondary | ICD-10-CM

## 2022-02-03 DIAGNOSIS — F411 Generalized anxiety disorder: Secondary | ICD-10-CM

## 2022-02-03 DIAGNOSIS — F4001 Agoraphobia with panic disorder: Secondary | ICD-10-CM

## 2022-02-03 DIAGNOSIS — Z79899 Other long term (current) drug therapy: Secondary | ICD-10-CM

## 2022-02-03 MED ORDER — DEXTROMETHORPHAN HBR 15 MG PO CAPS: 1.0000 | ORAL_CAPSULE | Freq: Two times a day (BID) | ORAL | 0 refills | Status: DC

## 2022-02-03 MED ORDER — RIVASTIGMINE TARTRATE 3 MG PO CAPS: 3.0000 mg | ORAL_CAPSULE | Freq: Two times a day (BID) | ORAL | 2 refills | Status: DC

## 2022-02-03 NOTE — Patient Instructions (Addendum)
Increase rivastigmine to 3 mg capsule 1 twice daily or 2 of the 1.5 mg capsules twice daily.  Call if you have problems with nausea  Start dextromethorphan 1 capsule twice daily.

## 2022-02-03 NOTE — Progress Notes (Signed)
Albert [name] PT:[id number] 05/08/1965 56 y.o.    Subjective:   Patient ID:  Albert [name] is a 56 y.o. (DOB 1965-06-03) male.  Chief Complaint:  Chief Complaint  Patient presents with   Follow-up    Severe bipolar I disorder, current or most recent episode depressed ([place])   Anxiety   Memory Loss   Depression    Depression        Associated symptoms include decreased concentration and suicidal ideas.  Past medical history includes anxiety.   Anxiety Symptoms include decreased concentration, nervous/anxious behavior and suicidal ideas. Patient reports no chest pain, confusion or palpitations.      Albert [name] presents to the office today for follow-up of mixed bipolar, anxiety and still grieving stress of breakup.  He requires frequent follow-up because of long-term symptoms.  At visit October 25, 2018.  We made several medicine changes because of his concerns about sleep and other issues.  We increased olanzapine back to 7.5 mg bc not sleeping as well at 5 mg.  He has to have the prescription for quetiapine written for up to 3 tablets at night in case insomnia is worse because insomnia makes his mood disorder so much worse.  We discussed that was above the usual max but the request was granted given his treatment resistant status. We also reduce lithium from 900 mg daily to 750 mg daily to try to reduce tremor and muscle twitches.  At  visit March 28, 2019.  Fluoxetine was increased to 40 mg daily.   Early January 2021 increased to 60 mg daily.  No SE.  seen June 28, 2019.  No further meds were changed except olanzapine was increased back to 10 mg daily to see if if he could get additional benefit per his request.  Covid vaccinated.  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  Then had cholecystectomy.  April 2021 appt with the following noted: 2 episodes night sweats lately.  Memory has been very bad lately.  Repeatedly asks mother questions. Still  tend to stay in his room and his bed.  Still  has rapid cycling mood swings.  Maybe some better with increase in olanzapine to 10 and tolerating itl.  Would like to get out more but can't DT Covid.  Can perform necessary chores.  Will get out of the house when he can.  No change in death thoughts and anxiety in intensity but is better with frequency.  Usually comes and goes in waves but more persistent.  Consistent with meds.  Ativan not helping anxiety very dramatically but he's not sure.  Fidgety.  No trigger other than still grieving relationship and can't get it out of his head.  Poor energy, concentration and more forgetful. In bed more and less active.  Sleep ok lately which is unusual.  Can concentrate on financial matters and stock market at times.  Tolerating meds.  Still some intrusive SI without reason. Less frequent obsessive thoughts about broken relationship and has been doing this for months.  Can't let it go.  Loop.   No unusual stress even with the family who is supportive. Likes the benefit that Zyprexa gives. No sleepwalking nor falling nor odd behavior.  No history of sleep walking.  He understands this may recur with an increase.  Also wants option to rarely take extra quetiapine 300 for sleep prn. Plan:  Try to reduce lorazepam if possible.  10/22/2019 appointment with the following noted: Continues fluoxetine 60, lithium 600  mg, lorazepam 2 mg AM and HS and 1 mg midday, olanzapine 10, quetiapine 600 mg HS. Still anxious chronically and including driving in crowded spaces.  Doesn't thing Ativan helps as well as Xanax but less cognitive problems. Sleep is not as good.  Occ EFA.  More EMA and wanting to do things in the middle of the night but this is not typical.  Wonders about why that happens.  Some napping.   Tolerating meds.  Asks about weight loss meds.  Disc this in detail.   Plan no changes except OK meclizine prn vertigo.  02/11/20 appt with the following noted: Able to gradually reduce lorazepam to 1 mg AM and  HS. More depressed over time and less interested in things and less interest in going out but does with his parents. Has reduced from 800 to 600 mg HS with some awakening but usually able to go back to sleep.[name]  Spending a good amount of time in bed bc watches TV in bedroom.  Parents watch TV in different part of the house.  No mood swings he notices.   Concerns about weight gain about 200#. Likes olanzapine's benefit for sleep. Plan: Trial for TRD to  Increase fluoxetine to 80 mg daily  to use the combo with olanzapine for TR bipolar depression.   04/21/20 appt with following noted: I thought in beginning some benefit with fluoxetine.  Lifelong negative thinking continues.   Not much bipolar.  Reads a lot on bipolar.   Still tired and anxious a little more may be seasonal.  No familial stressors.  Tends to lay down in afternoon.  Anxiety not over anything in particular.   No SE with fluoxetine. Took meclizine prn.  Easily motion sick.    Asks questions about newer drugs for bipolar like Caplyta. Plan:  No med changes  06/30/20 appt noted: Tired of wearing masks.  Vaccinated.  Asked questions about when this will end with mask mandate.  Mood up and down some since here and getting up 2-3 times.  Disc awakening problems.  Trying to do better bc night eating some.   Mood is about average today so far.  Martin Majestic out with friends for breakfast.  Recognizes activity helps mood.   3 days in a row napped a lot in the afternoon.   Bed is a comfort place.  Gets bored and hard to motivate.  Tries to stay away from night eating and spending.   More depressed when alone and wonders about med changes. Plan: Failed response to olanzapine + fluoxetine for TR bipolar depression.   Reduce fluoxetine to 1 daily and reduce olanzapine to one half nightly for 10 days and then stop it. Then start [place] 1 daily  08/27/2020 appointment with the following noted: Patient decompensated with the above switch to [place]  with intrusive suicidal thoughts and had to be hospitalized for psychiatric reasons.  Multiple phone calls with family members since that time.  Spoke with the psychiatric nurse practitioner with the decision to restart olanzapine in place of the [place] given the patient was more stable while on the combination of Seroquel and olanzapine than with the [place]. Caplyta triggered HI/SI and depressed and confused on it, and agitated and still has some of it now. No akathisia. Frustrated with lack of therapy at the hospital.   Hydroxyzine didn't help jitteriness.  Feels some better.  Fleeting SI & HI but not obsessive like it was prior to hospitalization.  Feels a little amped up  and nervous.  Scared of what could happen.  Apprehensive being here talking about things. 5-6 hours sleep last night and would like to have more. At mom and dad's house right now and up more in the day. Inadvertently stopped lorazepam abruptly by mistake likely contributing to shakiness. Still some memory issues.  Trying to stay out of bed when watching TV.  Some awakening but managing. Occ fleeting SI and distracts himself.   Always spends a lot of time just laying around.   Can have anxiety for no reason like coming here, and varies in intensity without pattern.      Less panic than in the past.  Some chronic depression and hyperactivity and loudness and hyperverbal.  Usually back to sleep.  Total 6 hours but some napping, awakens 2-4 times nightly but back to sleep..  Taking quetiapine 600 mg HS.  still lacks interest and motivation.  Can follow a TV show if interested. Plan: Restart lorazepam 1 mg in the morning, 1 mg in the afternoon and 1 mg at night for anxiety Stop hydroxyzine  Increase olanzapine to 1 and 1/2 of 10 mg tablets in evening  09/05/2020 appt noted: seen with parents today at his request Made med changes noted and tolerated the changes Better than I did last week. Now only fleeting HI/SI and "no where near what  it was".  Sleeping better.  Still not a lot of energy.  Anhedonia.  Less agitation.  A lot of anxiety all the time but it's helping.  Would like more energy and motivation.   Doesn't handle stress well. Parents note he's less shakey.  He's still staying with his parents.  Only driven once since this happened and F wants him to be able to drive and function more independently.  M agrees he's better.  Memory is better but not normal.  Primarily STM problems No akathisia with olanzapine this time.    Administering his own meds but mo watched. Slept 8 hours last night. Plan consider further reductions in quetiapine  10/17/20 appt noted: Stayed on Seroquel 600 HS and olanzapine 15. Olanzapine seemed to do most for the sleep.  Also helping eliminated HI/SI for the most part.  Wants to try to increase it.  Depression is much better with less rumination also.  Fears akathisia at higher dose as in the past. Also on fluoxetine 40.   Plan: Reduce Seroquel to 1 and 1/2 tablets at night Increase olanzapine to 1 of the 15 mg tablet and 1/2 of the 5 mg tablet for 1 week,  Then, if tolerated increase the olanzapine to 20 mg or 1 of the 15 mg tablets and 1 of the 5 mg tablets. If possible then also reduce quetiapine to 1 tablet at night.  11/05/2020 appointment with the following noted: Up to olanzapine 20 mg hs for 3 days.  Reduced Serooquel to 450 mg HS.  Didn't try to go lower. Had hiatal hernia and umbilical hernia surgery recently with no problem.   No akathisia so far. Sleep ok with meds so far changes.  No mood changes yet but overall likes the smoothness of olanzapine and helping sleep without knociing him out. Still some occ HI/SI, he thinks bc of anxiety generally.  Easily anxious. Plan: Continue olanzapine 20 mg nightly longer to give it time to help mood sx reduce quetiapine to 1 tablet at night to minimize polypharmacy and reduce risk of akathisia.  01/07/21 appt noted: Able to reduce quetiapine 300  mg  HS ok with decent sleep but still wakes a lot.  No AM hangover.  Don't have a lot to do.  Can enjoy going out and doing things.  But doesn't do it longterm.  Family is doing good.  Father started year 58 at his job.   Tolerating the meds well.  May wake more with quetiapine reduction. Lost 10#. Wonders if could try other meds he didn't tolerate before bc now can tolerate olanzapine and couldn't tolerate it in the past DT akathisia. Mood never great but can swing into depresssion without reason.   Plan: Continue olanzapine 20 mg nightly longer to give it time to help mood sx reduce quetiapine to 1/2 of the 300 mg  tablet at night to minimize polypharmacy and reduce risk of akathisia.  02/02/2021 appointment with the following noted: Tried reduced quetiapine to 150 mg and after a week had withdrawal sx including jittery and SI and he increased to 250 mg daily.  Felt better after increasing to 300 mg daily and last week reduced to 250 mg daily.  After increase quetiapine felt better in a few days.  Seems more alert with less quetiapine but can't tolerate a quick withdrawal.   Some awakening.  Plan: Reduce quetiapine by 50 mg every 2-4 weeks.  03/09/2021 phone call: Pt called and said that he is weaning off the seroquel. He was down to 200 mg of the seoquel and he started having withdrawal symptoms and sucidal thoughts. He went back up to 250 mg because he knew he was good on that.    Pt stated he starting back taking 250 mg on Saturday due to the symptoms he was having.Now he is no longer having suicidal thoughts but still very jittery. MD: He may just need to taper slower than most people.  If he can tolerate the 250 mg Seroquel with the jitteriness it should get better over the next 1 to 2 weeks.  Then he can try going down again by 50 mg at a time.  He probably needs to go down by just 50 mg every 2 to 4 weeks and wait till he fully adjusts to each reduction before he reduces again.  03/17/21 appt  noted: Hernia surgery 2 weeks ago. Seroquel 250 mg for a month then 200 mg daily and had SI and increased again to 300 mg daily.  Would love to get off it.  Withdrawal triggers intrusive HI/SI but otherwise doesn't have them No SE. Sleep is OK but not enough REM sleep.  Satisfied with other meds.   No current HI/SI.  No paranoia.  Still has anxiety and taking Ativan every 6 hours.  It works but doesn't cure it. Chronic anxiety and ask about ketamine for chronic depression. Plan: Because he had WD reducing from 250 to 200 mg daily will have to go slowly. Reduce quetiapine by 25 mg every 2-4 weeks. Reduce Seroquel to 250 mg for 2 weeks, Then reduce to 225 mg for 2 weeks, Then reduce to 200 mg for 2 weeks, Then reduce to 175 mg for 2 weeks, Then reduce to 150 mg Also: Buspirone 30 mg tablets for anxiety Start 1/3 tablet twice daily for 1 week Then increase to two thirds twice daily for 1 week Then increase to 1 tablet twice daily  04/15/2021 appointment with the following noted: Reduced Seroquel to 200 mg HS and increased buspirone to 30 mg BID STM px and wants to try to reduce Ativan to see if it is better.  But fears with drawal sx. Parents complain about his memory. Less HI and SI since here. Less depressed. Plan: Buspirone 30 mg tablets for anxiety Start 1/3 tablet twice daily for 1 week Then increase to two thirds twice daily for 1 week Then increase to 1 tablet twice daily Continue olanzapine 20 mg nightly longer to give it time to help mood sx Because he had WD reducing from 250 to 200 mg daily will have to go slowly. Reduce quetiapine by 25 mg every 2-4 weeks. Reduce Seroquel to 250 mg for 2 weeks, Then reduce to 225 mg for 2 weeks, Then reduce to 200 mg for 2 weeks, Then reduce to 175 mg for 2 weeks, Then reduce to 150 mg Trial taper lorazepam: In hopes of improving short-term memory  04/30/2022 phone call: Complaining of withdrawal symptoms reducing lorazepam from 0.5  mg 5 times a day to 0.5 mg 4 times daily.  05/21/2021 appointment with the following noted: Gotten onto buspar 30 BID and down on lorazepam to 0.5 mg QID. Gotten down to Seroquel 200 mg HS A little restless every now and then but able to reduce lorazepam for the last weeks. Is too inactive and lays in bed too much watching TV.   Can' t tell effect of buspirone bc of other changes.  Sleep with awakening with 6-7 hours. Fleeting SI without trigger or plan or intent. Depressed more than anxious. Enjoys watching stocks and investing. Plan: Trial taper lorazepam gradually over time In hopes of improving short-term memory But for now continue lorazepam 0.5 mg QID Start clonidine 0.1 mg tablets one half at night for 3 nights, then 1/2 tablet twice daily for 3 nights, then one half in the morning and 1 tablet at night If clonidine does not noticeably help anxiety call the office  05/27/2021 phone call initially complaining of homicidal and suicidal thoughts after taking 1 mg of clonidine.  He was told to stop the medication.  But then called back stating he was having homicidal and suicidal thoughts from withdrawal from the medication which was not logical.  He agreed it was not logical and stated on 05/29/2021 that he felt better  06/23/2021 appointment the following noted: No clonidine. Sunday panic attack eating out and then having SI.  Stayed with father to feel safer. Intrusive thoughts of hurting parents or others.  HI only when has SI.  Usually panic without SI.  Then had intrusive thoughts about hurting others.   Usually intrusive thoughts are brief and fleeting.    07/20/21  appt noted; More anxious with less lithium.  More anxious in his body.  Not more depressed or irritable.  Sleep is the same.  More racing thoughts.  Chronic SI probably a litte worse but not unmanageable. Rduced lithium last week to 300 mg daily. No recent change in lisinopril. Off the clonidine for a week or so. Doesn't  drink much water.  Drinks a lot of diet coke. No marked akathisia or RLS but does shake his leg DT anxiety. Worries about needing mor emedicine. Plan: Increase olanzapine to 25 mg nightly bc more racing thoughts and anxiety with less lithium DT severity of sx and difficulty getting off Seroquel conside rincrease.  07/30/21 TC : CO racing SI today and wonders about whether increased sx racing thoughts, hyperactivity, SI related to Reduced lithium from 600 mg daily to 300 mg daily recently DT lithium level 1.8.  could be related.    Plan: increase olanzapine 30 mg pm Today  take lithium 600 mg now.  Tomorrow start lithium CR 450 mg daily Continue olanzapine 30 mg PM until SI resolves unless akathisia returns.  Lynder Parents, MD, DFAPA  08/17/21 appt noted:  No akathisia with olanzapine 30 mg pm (about 3 weeks) and is sleeping better. Increased lithium to 450 mg daily. Tolerating meds. Still racing negative thoughts maybe some better.  Asked about it. SI come and go.   Anxiety about the same as depression.  No motivation unless has to do something. Plan: Reduce  fluoxetine 20 mg 1 daily in hopes racing thoughts and anxiety are better.  09/14/2021 appointment with the following noted: No difference noted with reduction fluoxetine 20 mg daily. Most of the time negative thoughts, ex "what if I get in a wreck?"  Frequent negative thoughts.   Still on lorazepam 0.5 mg TID.   Wonders about trying prior meds that caused akathisia bc no longer gets it with olanzapine 30 mg daily. Sleep is usually ok.  Taking Seroquel 200 mg HS.  Thinks the olanzapine helps his sleep and doesn't want to stop it. Panic 1-2 times per month.  Then takes extra lorazepam. Plan: DC fluoxetine 20 mg 1 daily  Trial Auvelity off label for depression 1 in the AM for 1 week then 1 in AM and 1 with evening meal  10/15/2021 appointment with the following noted: Occ of getting confused as to day and went to church on a Thursday my  mistake.   Had it happen a few months ago too.  Memory is terrible. Thinks he couldn't tolerate more Auvelity. Not a lot of benefit from [employer] 1 daily. Still quite a bit of anxiety.   A little drowsy  and sleeps 7-8 hours at night. Word finding problems.  Lost wallet. Plan: DC  Auvelity  For [facility]: memantine 1/2 tablet in the AM for 1 week, then 1/2 tablet twice daily, then 1/2 tablet in the AM and 1 tablet at night for 1 week then 1 tablet twice daily  10/22/2021 phone call complaining of additional stress asking to start new medication because of anxiety and more suicidal thoughts without intent or plan.  Reports taking more than 4 prescribed Ativan a day recently.  Asking to increase olanzapine which is already at 30 mg daily. MD response:We cannot go any higher in the dosage of olanzapine that he is currently taking.  A lot of these suicidal thoughts are apparently anxiety driven.  Let us have him resume taking paroxetine which is one of the more effective medicines for anxiety.  I will send it in and the instructions will be paroxetine 20 mg tablets 1/2 tablet daily for 1 week then 1 tablet daily  11/16/21 appt noted: Multiple phone calls since she was here.  Complaining of suicidal thoughts from memantine which was stopped. Started paroxetine and up to 20 mg daily.  Continues olanzapine 30 mg, lithium 450 mg nightly, lorazepam 0.5 mg 4 times daily , And quetiapine 200 mg nightly. Questions about whether memantine caused the neg SI really bc has them at times. When has SI often anxiety driven.  Will tend to ruminate on past relationship ended.   Edgy and irritable all the time but memantine seemed to make it worse. Mind is getting better with less SI lately.   No SE with paroxetine.   Aggrivated by forgetfulness.  eXample, talked with someone about going to breakfast last night and then forgot to go this mornin.g. Plan prescribed donepezil 5 mg daily for  cognitive complaints specifically  forgetfulness  12/31/2021 appointment with the following noted: He had been prescribed donepezil 5 mg for cognitive complaints.  He called back reporting he felt it was causing homicidal thoughts and was instructed to stop it.  He has had these types of thoughts in the past.  He had no desire to act on them. Discouraged. Ongoing depression and reduced enjoyment and negative thoughts.  Can enjoy some things. A lot of anxiety chronically.  Trying to limit Ativan to 5 of 0.5 mg daily.  Sometimes needs 2. Constipation for years.  02/03/22 appt noted: Depression makes LBP worse. Ongoing chronic depression. Tolerated rivastigmine without triggering SI Chronic anxiety and Ativan helps some. Occ panic but not usual.  Asks if any other meds coud be used.   Psych med hx extensive including ECT and  risperidone, Zyprexa 20-15 akathisia, Latuda 80 which caused akathisia,  Vraylar, Rexulti, aripiprazole 20 mg with akathisia,  Seroquel 1000 mg,  InVega, Geodon, Saphris with side effects, symbyax, Fanapt NR .  Caplyta SE and markedly worse. lithium 1200 SE,  lamotrigine 300 mg, Depakote 2000 mg, Tegretol, Trileptal and several of these in combinations, gabapentin,  N-acetylcysteine, Nuedexta,     Belsomra with no response,  Lunesta no response, trazodone 200 mg,  Xanax, clonazepam, lorazepam less sedation. Buspirone NR Clonidine SE with 2 trials  Viibryd 40 mg for 3 months with diarrhea, protriptyline with side effects,  Trintellix 20 mg,  Parnate 50 mg with no response,   imipramine, venlafaxine,  Emsam 12 mg for 2 months,   bupropion was side effects,  Lexapro 20 mg, sertraline, paroxetine, Deplin, fluoxetine 80 Auvelity NR at one daily.  SE BID  methylphenidate 60 mg,  Vyvanse, Concerta, strattera, , modafinil,  Memantine worsening SI? Donepezil, complained of HI  pramipexole,  amantadine ,  Patient prone to akathisia.   Review of Systems:  Review of Systems  Cardiovascular:   Negative for chest pain and palpitations.  Musculoskeletal:  Positive for back pain.  Neurological:  Positive for tremors.       Fidgety  Psychiatric/Behavioral:  Positive for decreased concentration and suicidal ideas. Negative for agitation, behavioral problems, confusion, dysphoric mood, hallucinations, self-injury and sleep disturbance. The patient is nervous/anxious. The patient is not hyperactive.     Medications: I have reviewed the patient's current medications.  Current Outpatient Medications  Medication Sig Dispense Refill   acetaminophen (TYLENOL) 500 MG tablet Take 1,000 mg by mouth every 6 (six) hours as needed for moderate pain.     fenofibrate (TRICOR) 145 MG tablet Take 145 mg by mouth daily.     fluticasone (FLONASE) 50 MCG/ACT nasal spray Place 1 spray into both nostrils daily as needed for allergies or rhinitis.     hydrocortisone (ANUSOL-HC) 2.5 % rectal cream Place 1 application rectally 3 (three) times daily as needed for hemorrhoids.     levothyroxine (SYNTHROID, LEVOTHROID) 75 MCG tablet Take 75 mcg by mouth daily before breakfast.     lisinopril (ZESTRIL) 5 MG tablet Take 5 mg by mouth daily.     lithium carbonate (ESKALITH) 450 MG CR tablet Take 1 tablet (450 mg total) by mouth at bedtime. 90 tablet 1   LORazepam (ATIVAN) 0.5 MG tablet Take 1 tablet (0.5 mg total) by mouth 4 (four) times daily. (Patient taking differently: Take 0.5 mg by mouth 4 (four) times daily. Sometimes may take 5-6 tabs per day) 120 tablet 1   meclizine (ANTIVERT) 25 MG tablet Take 1 tablet (25 mg  total) by mouth 3 (three) times daily as needed for dizziness. 30 tablet 1   memantine (NAMENDA) 10 MG tablet Take by mouth.     OLANZapine (ZYPREXA) 10 MG tablet TAKE ONE TABLET BY MOUTH AT BEDTIME 90 tablet 0   OLANZapine (ZYPREXA) 20 MG tablet Take 1 tablet (20 mg total) by mouth at bedtime. 90 tablet 1   PARoxetine (PAXIL) 40 MG tablet Take 1 tablet (40 mg total) by mouth daily. 90 tablet 0    polycarbophil (FIBERCON) 625 MG tablet Take 1,250 mg by mouth daily.     QUEtiapine (SEROQUEL) 200 MG tablet Take 1 tablet (200 mg total) by mouth at bedtime. 90 tablet 0   rivastigmine (EXELON) 1.5 MG capsule TAKE 1 CAPSULE BY MOUTH TWICE DAILY *DISCONTINUE DONEPEZIL* 60 capsule 0   sildenafil (VIAGRA) 100 MG tablet Take 1 tablet (100 mg total) by mouth daily as needed for erectile dysfunction. 30 tablet 6   tamsulosin (FLOMAX) 0.4 MG CAPS capsule Take 1 capsule (0.4 mg total) by mouth daily. 90 capsule 3   No current facility-administered medications for this visit.   Medication Side Effects: Other: mild sleepiness.   Occ twitches.\, tremor  Allergies:  Allergies  Allergen Reactions   Meloxicam Other (See Comments)    Other reaction(s): Dizziness   Nsaids     Other reaction(s): Hallucination, Other (See Comments)   Ibuprofen     Can not take because taking lithium   Prednisone     Can't sleep    Wellbutrin [Bupropion]     Suicide thoughts    Past Medical History:  Diagnosis Date   ADHD (attention deficit hyperactivity disorder)    Anemia    Anxiety    Bipolar disorder (HCC)    BPH (benign prostatic hyperplasia)    Chronic kidney disease, stage 3b (HCC)    DDD (degenerative disc disease), cervical    Depression    Deviated septum    ED (erectile dysfunction)    GERD (gastroesophageal reflux disease)    Graves disease    History of hiatal hernia    Hypertension    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    Hypothyroidism    Pneumonia    PONV (postoperative nausea and vomiting)     Family History  Problem Relation Age of Onset   Prostate cancer Neg Hx    Bladder Cancer Neg Hx    Kidney cancer Neg Hx     Social History   Socioeconomic History   Marital status: Single    Spouse name: Not on file   Number of children: Not on file   Years of education: Not on file   Highest education level: Not on file  Occupational History   Not on file   Tobacco Use   Smoking status: Former    Types: Cigarettes    Passive exposure: Past   Smokeless tobacco: Former    Types: Nurse, children's Use: Never used  Substance and Sexual Activity   Alcohol use: Not Currently   Drug use: Never   Sexual activity: Yes  Other Topics Concern   Not on file  Social History Narrative   Not on file   Social Determinants of Health   Financial Resource Strain: Not on file  Food Insecurity: Not on file  Transportation Needs: Not on file  Physical Activity: Not on file  Stress: Not on file  Social Connections: Not on file  Intimate Partner Violence:  Not on file    Past Medical History, Surgical history, Social history, and Family history were reviewed and updated as appropriate.   Please see review of systems for further details on the patient's review from today.   Objective:   Physical Exam:  BP 120/78   Pulse 96   Physical Exam Constitutional:      General: He is not in acute distress.    Appearance: He is well-developed.  Musculoskeletal:        General: No deformity.  Neurological:     Mental Status: He is alert and oriented to person, place, and time.     Cranial Nerves: No dysarthria.     Motor: Tremor present.     Coordination: Coordination normal.     Comments: Slight tremor  Psychiatric:        Attention and Perception: Attention and perception normal. He does not perceive auditory or visual hallucinations.        Mood and Affect: Mood is anxious and depressed. Affect is not labile, blunt, angry, tearful or inappropriate.        Speech: Speech normal. Speech is not slurred.        Behavior: Behavior normal. Behavior is not slowed. Behavior is cooperative.        Thought Content: Thought content is not delusional. Thought content includes suicidal ideation. Thought content does not include homicidal ideation. Thought content does not include suicidal plan.        Cognition and Memory: Cognition normal. He exhibits  impaired recent memory.        Judgment: Judgment normal.     Comments: Insight fair He is chronically anxious  worse with less lithium No manic signs noted. Some wordfinding problems ongoing. Intrusive thoughts lately Chronically down maybe worse Occ SI but can kick them out     Lab Review:     Component Value Date/Time   NA 139 02/26/2021 1253   NA 142 03/20/2014 0514   K 4.0 02/26/2021 1253   K 4.1 03/20/2014 0514   CL 109 02/26/2021 1253   CL 112 (H) 03/20/2014 0514   CO2 22 02/26/2021 1253   CO2 26 03/20/2014 0514   GLUCOSE 83 02/26/2021 1253   GLUCOSE 99 03/20/2014 0514   BUN 13 02/26/2021 1253   BUN 7 03/20/2014 0514   CREATININE 1.56 (H) 02/26/2021 1253   CREATININE 0.78 03/20/2014 0514   CALCIUM 9.6 02/26/2021 1253   CALCIUM 9.5 03/20/2014 0514   PROT 7.8 08/11/2020 1750   PROT 7.3 03/18/2014 1459   ALBUMIN 4.7 08/11/2020 1750   ALBUMIN 3.2 (L) 03/18/2014 1459   AST 23 08/11/2020 1750   AST 16 03/18/2014 1459   ALT 16 08/11/2020 1750   ALT 16 03/18/2014 1459   ALKPHOS 34 (L) 08/11/2020 1750   ALKPHOS 105 03/18/2014 1459   BILITOT 0.5 08/11/2020 1750   BILITOT 0.3 03/18/2014 1459   GFRNONAA 51 (L) 02/26/2021 1253   GFRNONAA >60 03/20/2014 0514   GFRAA 54 (L) 04/18/2019 1612   GFRAA >60 03/20/2014 0514       Component Value Date/Time   WBC 6.2 02/26/2021 1253   RBC 4.17 (L) 02/26/2021 1253   HGB 13.3 02/26/2021 1253   HGB 12.6 (L) 03/20/2014 0514   HCT 39.4 02/26/2021 1253   HCT 38.4 (L) 03/20/2014 0514   PLT 431 (H) 02/26/2021 1253   PLT 346 03/20/2014 0514   MCV 94.5 02/26/2021 1253   MCV 89 03/20/2014 0514   MCH  31.9 02/26/2021 1253   MCHC 33.8 02/26/2021 1253   RDW 13.1 02/26/2021 1253   RDW 12.5 03/20/2014 0514   LYMPHSABS 3.2 03/20/2014 0514   MONOABS 1.0 03/20/2014 0514   EOSABS 0.4 03/20/2014 0514   BASOSABS 0.1 03/20/2014 0514    Lithium Lvl  Date Value Ref Range Status  08/12/2021 0.9 0.5 - 1.2 mmol/L Final    Comment:    A  concentration of 0.5-0.8 mmol/L is advised for long-term use; concentrations of up to 1.2 mmol/L may be necessary during acute treatment.                                  Detection Limit = 0.1                           <0.1 indicates None Detected    08/12/21 lithium level 0.9 on 450 mg.  lithium level Sept 0.8.   lithium level July 27, 2018 was normal at 1.0.   Lithium level [facility] October 03, 2018 = 1.2. Said he got lithium level at [place] as requested.  Labs not in Epic.  Recent lipids ok except higher TG than usual.  Normal A1C.  No results found for: "PHENYTOIN", "PHENOBARB", "VALPROATE", "CBMZ"   .res Assessment: Plan:    Jery was seen today for follow-up, anxiety, memory loss and depression.  Diagnoses and all orders for this visit:  Severe bipolar I disorder, current or most recent episode depressed (HCC)  Mild cognitive impairment  Generalized anxiety disorder  Panic disorder with agoraphobia  Insomnia due to mental condition  Lithium use    Chronic TR bipolar mixed and chronic anxiety.  He usually has mixed bipolar symptoms which we have not been able to completely eliminate.  See long list of meds tried.   He is somewhat chronically unstable but better on olanzapine worse if reduces quetiapine quickly.  Discussed mother's concerns about his memory which is primarily a short-term memory issue.  We discussed the alternative options of using Namenda off label for mild cognitive impairment.  We are not yet able to reduce the dose of lorazepam but that could potentially help as well.  His memory was much worse on Xanax than lorazepam. We discussed the short-term risks associated with benzodiazepines including sedation and increased fall risk among others.  Discussed long-term side effect risk including dependence, potential withdrawal symptoms, and the potential eventual dose-related risk of dementia. Disc newer studies refute dementia risks.  Unfortunately due to the  severity of set his symptoms polypharmacy is a necessity.  Disc unusual combo antipsychotics but it helps more than other options.  Discussed potential metabolic side effects associated with atypical antipsychotics, as well as potential risk for movement side effects. Advised pt to contact office if movement side effects occur.  He had some falling around the time when he previously took olanzapine but he took it for several months at this dosage before he had any side effect issues and he was on Xanax at the time that the following occurred. Take LED Seroquel to sleep.   No obvious alternatives for sleep  Lithium being used bc chronic SI and death thoughts.   Counseled patient regarding potential benefits, risks, and side effects of lithium to include potential risk of lithium affecting thyroid and renal function.  Discussed need for periodic lab monitoring to determine drug level and to assess for  potential adverse effects.  Counseled patient regarding signs and symptoms of lithium toxicity and advised that they notify office immediately or seek urgent medical attention if experiencing these signs and symptoms.  Patient advised to contact office with any questions or concerns. Continue lithium  450 mg daily Lithium level 0.7 on 3/115/23 on 300 mg daily Call if death thoughts worsen or worsening SI.   Disc SE. Disc combo with Seroquel may lower response rate but he doesn't think he can sleep without Seroquel.  continue olanzapine to 30 mg nightly  for more racing thoughts and anxiety with less lithium DT severity of sx and difficulty getting off Seroquel conside rincrease.  Disc in detail again clozapine.  Disc risk in detail including low WBC with complication, myocarditis.  Extensive discussion of CBC monitoring.  Disc this again.  He doesn't want to do this now bc has to make so many other med changes to work this.  Disc WD  and may need to go lower before he can stop it. continue Seroquel  200 mg .  Would like to see him taper off if possible.  Continue  paroxetine 40 mg daily for depression and anxiety DT both being TR This does contribute to DDI.  Consider fluvoxamine Did he take duloxetine?  Might help depression more  But for now continue lorazepam 0.5 mg QID, but use LED for cog reasons  Increase rivastigmine to 3 mg capsule 1 twice daily or 2 of the 1.5 mg capsules twice daily.  Call if you have problems with nausea  Start dextromethorphan 1 capsule twice daily . Thi sis off label for depression based on MOA of Auvelity and use of paroxetine to prolong half life of DM. Presence of paroxetine may have been reason he didn't tolerate Auvelity brief trial before.  Will proceed slowly.s  We discussed the short-term risks associated with benzodiazepines including sedation and increased fall risk among others.  Discussed long-term side effect risk including dependence, potential withdrawal symptoms, and the potential eventual dose-related risk of dementia.  But recent studies from 2020 dispute this association between benzodiazepines and dementia risk. Newer studies in 2020 do not support an association with dementia.  Discussed safety plan at length with patient.  Advised patient to contact office with any worsening signs and symptoms.  Instructed patient to go to the [facility] emergency room for evaluation if experiencing any acute safety concerns, to include suicidal intent.  Disc Ozempic and Monjauro for weight loss.  Has had weight gain from meds as a contributor. But lately has lost some weight.  Disc Dr. Ardelia Mems mentioned evidence it could help depression  Needs to keep up activity as much as possible for depression.  Take Miralax daily for constipation.  Could be SE Seroquel.  This appt was 30 mins.  FU 4 weeks   Lynder Parents, MD, DFAPA  Future Appointments  Date Time Provider [place]  03/17/2022 11:00 AM Cottle, Billey Co., MD [facility] None   12/30/2022  2:30 PM Billey Co, MD [facility] None    No orders of the defined types were placed in this encounter.      -------------------------------

## 2022-02-13 ENCOUNTER — Other Ambulatory Visit: Payer: Self-pay | Admitting: Psychiatry

## 2022-02-13 DIAGNOSIS — F4001 Agoraphobia with panic disorder: Secondary | ICD-10-CM

## 2022-02-13 DIAGNOSIS — F411 Generalized anxiety disorder: Secondary | ICD-10-CM

## 2022-03-16 ENCOUNTER — Other Ambulatory Visit: Payer: Self-pay | Admitting: Psychiatry

## 2022-03-16 DIAGNOSIS — F314 Bipolar disorder, current episode depressed, severe, without psychotic features: Secondary | ICD-10-CM

## 2022-03-16 DIAGNOSIS — F4001 Agoraphobia with panic disorder: Secondary | ICD-10-CM

## 2022-03-16 DIAGNOSIS — F411 Generalized anxiety disorder: Secondary | ICD-10-CM

## 2022-03-17 ENCOUNTER — Encounter: Payer: Self-pay | Admitting: Psychiatry

## 2022-03-17 ENCOUNTER — Ambulatory Visit (INDEPENDENT_AMBULATORY_CARE_PROVIDER_SITE_OTHER): Payer: Medicare PPO | Admitting: Psychiatry

## 2022-03-17 ENCOUNTER — Telehealth: Payer: Self-pay | Admitting: Psychiatry

## 2022-03-17 VITALS — BP 122/83 | HR 106

## 2022-03-17 DIAGNOSIS — G3184 Mild cognitive impairment, so stated: Secondary | ICD-10-CM

## 2022-03-17 DIAGNOSIS — F4001 Agoraphobia with panic disorder: Secondary | ICD-10-CM | POA: Diagnosis not present

## 2022-03-17 DIAGNOSIS — F411 Generalized anxiety disorder: Secondary | ICD-10-CM

## 2022-03-17 DIAGNOSIS — R6889 Other general symptoms and signs: Secondary | ICD-10-CM

## 2022-03-17 DIAGNOSIS — F314 Bipolar disorder, current episode depressed, severe, without psychotic features: Secondary | ICD-10-CM

## 2022-03-17 DIAGNOSIS — F5105 Insomnia due to other mental disorder: Secondary | ICD-10-CM

## 2022-03-17 DIAGNOSIS — Z79899 Other long term (current) drug therapy: Secondary | ICD-10-CM

## 2022-03-17 MED ORDER — RIVASTIGMINE TARTRATE 4.5 MG PO CAPS: 4.5000 mg | ORAL_CAPSULE | Freq: Two times a day (BID) | ORAL | 1 refills | Status: DC

## 2022-03-17 NOTE — Telephone Encounter (Signed)
Patient informed me after appt that he is not taking Dextromethorphan and needed to let CC know. Ph: [phone]

## 2022-03-17 NOTE — Patient Instructions (Addendum)
Stop dextromethorphan to see if it is causing the anxiety.  Make note about whether the anxiety is improved or not.    Wait 1 week then increase rivastigmine for memory to 4.5 mg twice daily.

## 2022-03-17 NOTE — Progress Notes (Signed)
Albert [name] [id number] 1966-01-17 56 y.o.    Subjective:   Patient ID:  Albert [name] is a 56 y.o. (DOB 1966-03-17) male.  Chief Complaint:  Chief Complaint  Patient presents with   Follow-up    Severe bipolar I disorder, current or most recent episode depressed (HCC   Depression   Anxiety   Memory Loss    Depression        Associated symptoms include decreased concentration and suicidal ideas.  Past medical history includes anxiety.   Anxiety Symptoms include decreased concentration, nervous/anxious behavior and suicidal ideas. Patient reports no confusion or palpitations.      Albert [name] presents to the office today for follow-up of mixed bipolar, anxiety and still grieving stress of breakup.  He requires frequent follow-up because of long-term symptoms.  At visit October 25, 2018.  We made several medicine changes because of his concerns about sleep and other issues.  We increased olanzapine back to 7.5 mg bc not sleeping as well at 5 mg.  He has to have the prescription for quetiapine written for up to 3 tablets at night in case insomnia is worse because insomnia makes his mood disorder so much worse.  We discussed that was above the usual max but the request was granted given his treatment resistant status. We also reduce lithium from 900 mg daily to 750 mg daily to try to reduce tremor and muscle twitches.  At  visit March 28, 2019.  Fluoxetine was increased to 40 mg daily.   Early January 2021 increased to 60 mg daily.  No SE.  seen June 28, 2019.  No further meds were changed except olanzapine was increased back to 10 mg daily to see if if he could get additional benefit per his request.  Covid vaccinated.  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  Then had cholecystectomy.  April 2021 appt with the following noted: 2 episodes night sweats lately.  Memory has been very bad lately.  Repeatedly asks mother questions. Still  tend to stay in his room and his bed.  Still has rapid  cycling mood swings.  Maybe some better with increase in olanzapine to 10 and tolerating itl.  Would like to get out more but can't DT Covid.  Can perform necessary chores.  Will get out of the house when he can.  No change in death thoughts and anxiety in intensity but is better with frequency.  Usually comes and goes in waves but more persistent.  Consistent with meds.  Ativan not helping anxiety very dramatically but he's not sure.  Fidgety.  No trigger other than still grieving relationship and can't get it out of his head.  Poor energy, concentration and more forgetful. In bed more and less active.  Sleep ok lately which is unusual.  Can concentrate on financial matters and stock market at times.  Tolerating meds.  Still some intrusive SI without reason. Less frequent obsessive thoughts about broken relationship and has been doing this for months.  Can't let it go.  Loop.   No unusual stress even with the family who is supportive. Likes the benefit that Zyprexa gives. No sleepwalking nor falling nor odd behavior.  No history of sleep walking.  He understands this may recur with an increase.  Also wants option to rarely take extra quetiapine 300 for sleep prn. Plan:  Try to reduce lorazepam if possible.  10/22/2019 appointment with the following noted: Continues fluoxetine 60, lithium 600 mg, lorazepam  2 mg AM and HS and 1 mg midday, olanzapine 10, quetiapine 600 mg HS. Still anxious chronically and including driving in crowded spaces.  Doesn't thing Ativan helps as well as Xanax but less cognitive problems. Sleep is not as good.  Occ EFA.  More EMA and wanting to do things in the middle of the night but this is not typical.  Wonders about why that happens.  Some napping.   Tolerating meds.  Asks about weight loss meds.  Disc this in detail.   Plan no changes except OK meclizine prn vertigo.  02/11/20 appt with the following noted: Able to gradually reduce lorazepam to 1 mg AM and HS. More  depressed over time and less interested in things and less interest in going out but does with his parents. Has reduced from 800 to 600 mg HS with some awakening but usually able to go back to sleep.[name]  Spending a good amount of time in bed bc watches TV in bedroom.  Parents watch TV in different part of the house.  No mood swings he notices.   Concerns about weight gain about 200#. Likes olanzapine's benefit for sleep. Plan: Trial for TRD to  Increase fluoxetine to 80 mg daily  to use the combo with olanzapine for TR bipolar depression.   04/21/20 appt with following noted: I thought in beginning some benefit with fluoxetine.  Lifelong negative thinking continues.   Not much bipolar.  Reads a lot on bipolar.   Still tired and anxious a little more may be seasonal.  No familial stressors.  Tends to lay down in afternoon.  Anxiety not over anything in particular.   No SE with fluoxetine. Took meclizine prn.  Easily motion sick.    Asks questions about newer drugs for bipolar like Caplyta. Plan:  No med changes  06/30/20 appt noted: Tired of wearing masks.  Vaccinated.  Asked questions about when this will end with mask mandate.  Mood up and down some since here and getting up 2-3 times.  Disc awakening problems.  Trying to do better bc night eating some.   Mood is about average today so far.  Albert [name] out with friends for breakfast.  Recognizes activity helps mood.   3 days in a row napped a lot in the afternoon.   Bed is a comfort place.  Gets bored and hard to motivate.  Tries to stay away from night eating and spending.   More depressed when alone and wonders about med changes. Plan: Failed response to olanzapine + fluoxetine for TR bipolar depression.   Reduce fluoxetine to 1 daily and reduce olanzapine to one half nightly for 10 days and then stop it. Then start Caplyta 1 daily  08/27/2020 appointment with the following noted: Patient decompensated with the above switch to Caplyta with  intrusive suicidal thoughts and had to be hospitalized for psychiatric reasons.  Multiple phone calls with family members since that time.  Spoke with the psychiatric nurse practitioner with the decision to restart olanzapine in place of the Caplyta given the patient was more stable while on the combination of Seroquel and olanzapine than with the Caplyta. Caplyta triggered HI/SI and depressed and confused on it, and agitated and still has some of it now. No akathisia. Frustrated with lack of therapy at the hospital.   Hydroxyzine didn't help jitteriness.  Feels some better.  Fleeting SI & HI but not obsessive like it was prior to hospitalization.  Feels a little amped up and nervous.  Scared of what could happen.  Apprehensive being here talking about things. 5-6 hours sleep last night and would like to have more. At mom and dad's house right now and up more in the day. Inadvertently stopped lorazepam abruptly by mistake likely contributing to shakiness. Still some memory issues.  Trying to stay out of bed when watching TV.  Some awakening but managing. Occ fleeting SI and distracts himself.   Always spends a lot of time just laying around.   Can have anxiety for no reason like coming here, and varies in intensity without pattern.      Less panic than in the past.  Some chronic depression and hyperactivity and loudness and hyperverbal.  Usually back to sleep.  Total 6 hours but some napping, awakens 2-4 times nightly but back to sleep..  Taking quetiapine 600 mg HS.  still lacks interest and motivation.  Can follow a TV show if interested. Plan: Restart lorazepam 1 mg in the morning, 1 mg in the afternoon and 1 mg at night for anxiety Stop hydroxyzine  Increase olanzapine to 1 and 1/2 of 10 mg tablets in evening  09/05/2020 appt noted: seen with parents today at his request Made med changes noted and tolerated the changes Better than I did last week. Now only fleeting HI/SI and "no where near what it  was".  Sleeping better.  Still not a lot of energy.  Anhedonia.  Less agitation.  A lot of anxiety all the time but it's helping.  Would like more energy and motivation.   Doesn't handle stress well. Parents note he's less shakey.  He's still staying with his parents.  Only driven once since this happened and F wants him to be able to drive and function more independently.  M agrees he's better.  Memory is better but not normal.  Primarily STM problems No akathisia with olanzapine this time.    Administering his own meds but mo watched. Slept 8 hours last night. Plan consider further reductions in quetiapine  10/17/20 appt noted: Stayed on Seroquel 600 HS and olanzapine 15. Olanzapine seemed to do most for the sleep.  Also helping eliminated HI/SI for the most part.  Wants to try to increase it.  Depression is much better with less rumination also.  Fears akathisia at higher dose as in the past. Also on fluoxetine 40.   Plan: Reduce Seroquel to 1 and 1/2 tablets at night Increase olanzapine to 1 of the 15 mg tablet and 1/2 of the 5 mg tablet for 1 week,  Then, if tolerated increase the olanzapine to 20 mg or 1 of the 15 mg tablets and 1 of the 5 mg tablets. If possible then also reduce quetiapine to 1 tablet at night.  11/05/2020 appointment with the following noted: Up to olanzapine 20 mg hs for 3 days.  Reduced Serooquel to 450 mg HS.  Didn't try to go lower. Had hiatal hernia and umbilical hernia surgery recently with no problem.   No akathisia so far. Sleep ok with meds so far changes.  No mood changes yet but overall likes the smoothness of olanzapine and helping sleep without knociing him out. Still some occ HI/SI, he thinks bc of anxiety generally.  Easily anxious. Plan: Continue olanzapine 20 mg nightly longer to give it time to help mood sx reduce quetiapine to 1 tablet at night to minimize polypharmacy and reduce risk of akathisia.  01/07/21 appt noted: Able to reduce quetiapine 300 mg  HS ok with  decent sleep but still wakes a lot.  No AM hangover.  Don't have a lot to do.  Can enjoy going out and doing things.  But doesn't do it longterm.  Family is doing good.  Father started year 55 at his job.   Tolerating the meds well.  May wake more with quetiapine reduction. Lost 10#. Wonders if could try other meds he didn't tolerate before bc now can tolerate olanzapine and couldn't tolerate it in the past DT akathisia. Mood never great but can swing into depresssion without reason.   Plan: Continue olanzapine 20 mg nightly longer to give it time to help mood sx reduce quetiapine to 1/2 of the 300 mg  tablet at night to minimize polypharmacy and reduce risk of akathisia.  02/02/2021 appointment with the following noted: Tried reduced quetiapine to 150 mg and after a week had withdrawal sx including jittery and SI and he increased to 250 mg daily.  Felt better after increasing to 300 mg daily and last week reduced to 250 mg daily.  After increase quetiapine felt better in a few days.  Seems more alert with less quetiapine but can't tolerate a quick withdrawal.   Some awakening.  Plan: Reduce quetiapine by 50 mg every 2-4 weeks.  03/09/2021 phone call: Pt called and said that he is weaning off the seroquel. He was down to 200 mg of the seoquel and he started having withdrawal symptoms and sucidal thoughts. He went back up to 250 mg because he knew he was good on that.    Pt stated he starting back taking 250 mg on Saturday due to the symptoms he was having.Now he is no longer having suicidal thoughts but still very jittery. MD: He may just need to taper slower than most people.  If he can tolerate the 250 mg Seroquel with the jitteriness it should get better over the next 1 to 2 weeks.  Then he can try going down again by 50 mg at a time.  He probably needs to go down by just 50 mg every 2 to 4 weeks and wait till he fully adjusts to each reduction before he reduces again.  03/17/21 appt  noted: Hernia surgery 2 weeks ago. Seroquel 250 mg for a month then 200 mg daily and had SI and increased again to 300 mg daily.  Would love to get off it.  Withdrawal triggers intrusive HI/SI but otherwise doesn't have them No SE. Sleep is OK but not enough REM sleep.  Satisfied with other meds.   No current HI/SI.  No paranoia.  Still has anxiety and taking Ativan every 6 hours.  It works but doesn't cure it. Chronic anxiety and ask about ketamine for chronic depression. Plan: Because he had WD reducing from 250 to 200 mg daily will have to go slowly. Reduce quetiapine by 25 mg every 2-4 weeks. Reduce Seroquel to 250 mg for 2 weeks, Then reduce to 225 mg for 2 weeks, Then reduce to 200 mg for 2 weeks, Then reduce to 175 mg for 2 weeks, Then reduce to 150 mg Also: Buspirone 30 mg tablets for anxiety Start 1/3 tablet twice daily for 1 week Then increase to two thirds twice daily for 1 week Then increase to 1 tablet twice daily  04/15/2021 appointment with the following noted: Reduced Seroquel to 200 mg HS and increased buspirone to 30 mg BID STM px and wants to try to reduce Ativan to see if it is better.  But fears  with drawal sx. Parents complain about his memory. Less HI and SI since here. Less depressed. Plan: Buspirone 30 mg tablets for anxiety Start 1/3 tablet twice daily for 1 week Then increase to two thirds twice daily for 1 week Then increase to 1 tablet twice daily Continue olanzapine 20 mg nightly longer to give it time to help mood sx Because he had WD reducing from 250 to 200 mg daily will have to go slowly. Reduce quetiapine by 25 mg every 2-4 weeks. Reduce Seroquel to 250 mg for 2 weeks, Then reduce to 225 mg for 2 weeks, Then reduce to 200 mg for 2 weeks, Then reduce to 175 mg for 2 weeks, Then reduce to 150 mg Trial taper lorazepam: In hopes of improving short-term memory  04/30/2022 phone call: Complaining of withdrawal symptoms reducing lorazepam from 0.5  mg 5 times a day to 0.5 mg 4 times daily.  05/21/2021 appointment with the following noted: Gotten onto buspar 30 BID and down on lorazepam to 0.5 mg QID. Gotten down to Seroquel 200 mg HS A little restless every now and then but able to reduce lorazepam for the last weeks. Is too inactive and lays in bed too much watching TV.   Can' t tell effect of buspirone bc of other changes.  Sleep with awakening with 6-7 hours. Fleeting SI without trigger or plan or intent. Depressed more than anxious. Enjoys watching stocks and investing. Plan: Trial taper lorazepam gradually over time In hopes of improving short-term memory But for now continue lorazepam 0.5 mg QID Start clonidine 0.1 mg tablets one half at night for 3 nights, then 1/2 tablet twice daily for 3 nights, then one half in the morning and 1 tablet at night If clonidine does not noticeably help anxiety call the office  05/27/2021 phone call initially complaining of homicidal and suicidal thoughts after taking 1 mg of clonidine.  He was told to stop the medication.  But then called back stating he was having homicidal and suicidal thoughts from withdrawal from the medication which was not logical.  He agreed it was not logical and stated on 05/29/2021 that he felt better  06/23/2021 appointment the following noted: No clonidine. Sunday panic attack eating out and then having SI.  Stayed with father to feel safer. Intrusive thoughts of hurting parents or others.  HI only when has SI.  Usually panic without SI.  Then had intrusive thoughts about hurting others.   Usually intrusive thoughts are brief and fleeting.    07/20/21  appt noted; More anxious with less lithium.  More anxious in his body.  Not more depressed or irritable.  Sleep is the same.  More racing thoughts.  Chronic SI probably a litte worse but not unmanageable. Rduced lithium last week to 300 mg daily. No recent change in lisinopril. Off the clonidine for a week or so. Doesn't  drink much water.  Drinks a lot of diet coke. No marked akathisia or RLS but does shake his leg DT anxiety. Worries about needing mor emedicine. Plan: Increase olanzapine to 25 mg nightly bc more racing thoughts and anxiety with less lithium DT severity of sx and difficulty getting off Seroquel conside rincrease.  07/30/21 TC : CO racing SI today and wonders about whether increased sx racing thoughts, hyperactivity, SI related to Reduced lithium from 600 mg daily to 300 mg daily recently DT lithium level 1.8.  could be related.    Plan: increase olanzapine 30 mg pm Today take lithium  600 mg now.  Tomorrow start lithium CR 450 mg daily Continue olanzapine 30 mg PM until SI resolves unless akathisia returns.  Lynder Parents, MD, DFAPA  08/17/21 appt noted:  No akathisia with olanzapine 30 mg pm (about 3 weeks) and is sleeping better. Increased lithium to 450 mg daily. Tolerating meds. Still racing negative thoughts maybe some better.  Asked about it. SI come and go.   Anxiety about the same as depression.  No motivation unless has to do something. Plan: Reduce  fluoxetine 20 mg 1 daily in hopes racing thoughts and anxiety are better.  09/14/2021 appointment with the following noted: No difference noted with reduction fluoxetine 20 mg daily. Most of the time negative thoughts, ex "what if I get in a wreck?"  Frequent negative thoughts.   Still on lorazepam 0.5 mg TID.   Wonders about trying prior meds that caused akathisia bc no longer gets it with olanzapine 30 mg daily. Sleep is usually ok.  Taking Seroquel 200 mg HS.  Thinks the olanzapine helps his sleep and doesn't want to stop it. Panic 1-2 times per month.  Then takes extra lorazepam. Plan: DC fluoxetine 20 mg 1 daily  Trial Auvelity off label for depression 1 in the AM for 1 week then 1 in AM and 1 with evening meal  10/15/2021 appointment with the following noted: Occ of getting confused as to day and went to church on a Thursday my  mistake.   Had it happen a few months ago too.  Memory is terrible. Thinks he couldn't tolerate more Auvelity. Not a lot of benefit from [place] 1 daily. Still quite a bit of anxiety.   A little drowsy  and sleeps 7-8 hours at night. Word finding problems.  Lost wallet. Plan: DC  Auvelity  For [facility]: memantine 1/2 tablet in the AM for 1 week, then 1/2 tablet twice daily, then 1/2 tablet in the AM and 1 tablet at night for 1 week then 1 tablet twice daily  10/22/2021 phone call complaining of additional stress asking to start new medication because of anxiety and more suicidal thoughts without intent or plan.  Reports taking more than 4 prescribed Ativan a day recently.  Asking to increase olanzapine which is already at 30 mg daily. MD response:We cannot go any higher in the dosage of olanzapine that he is currently taking.  A lot of these suicidal thoughts are apparently anxiety driven.  Let us have him resume taking paroxetine which is one of the more effective medicines for anxiety.  I will send it in and the instructions will be paroxetine 20 mg tablets 1/2 tablet daily for 1 week then 1 tablet daily  11/16/21 appt noted: Multiple phone calls since she was here.  Complaining of suicidal thoughts from memantine which was stopped. Started paroxetine and up to 20 mg daily.  Continues olanzapine 30 mg, lithium 450 mg nightly, lorazepam 0.5 mg 4 times daily , And quetiapine 200 mg nightly. Questions about whether memantine caused the neg SI really bc has them at times. When has SI often anxiety driven.  Will tend to ruminate on past relationship ended.   Edgy and irritable all the time but memantine seemed to make it worse. Mind is getting better with less SI lately.   No SE with paroxetine.   Aggrivated by forgetfulness.  eXample, talked with someone about going to breakfast last night and then forgot to go this mornin.g. Plan prescribed donepezil 5 mg daily for cognitive complaints  specifically  forgetfulness  12/31/2021 appointment with the following noted: He had been prescribed donepezil 5 mg for cognitive complaints.  He called back reporting he felt it was causing homicidal thoughts and was instructed to stop it.  He has had these types of thoughts in the past.  He had no desire to act on them. Discouraged. Ongoing depression and reduced enjoyment and negative thoughts.  Can enjoy some things. A lot of anxiety chronically.  Trying to limit Ativan to 5 of 0.5 mg daily.  Sometimes needs 2. Constipation for years.  02/03/22 appt noted: Depression makes LBP worse. Ongoing chronic depression. Tolerated rivastigmine without triggering SI Chronic anxiety and Ativan helps some. Occ panic but not usual.  Asks if any other meds coud be used. Plan: But for now continue lorazepam 0.5 mg QID, but use LED for cog reasons Increase rivastigmine to 3 mg capsule 1 twice daily or 2 of the 1.5 mg capsules twice daily.  Call if you have problems with nausea Start dextromethorphan 1 capsule twice daily . Thi sis off label for depression based on MOA of Auvelity and use of paroxetine to prolong half life of DM. Presence of paroxetine may have been reason he didn't tolerate Auvelity brief trial before.  Will proceed slowly.  03/17/2022 appointment noted: Current psych meds: Rivastigmine 3 mg twice daily, quetiapine 200 mg nightly, paroxetine 40 mg daily, olanzapine 30 mg nightly, Namenda 10 mg daily, lorazepam 0.5 mg 4 times daily, lithium CR 450 mg nightly, dextromethorphan 15 mg twice daily A lot of anxiety since here.  Some days taken more lorazepam to 6-7 tablets daily.  No major triggers but anxious even about coming here and other normal things.  Cancelled trip to collect rocks with friend and cancelled the trip bc anxious.  Usually if can go he is ok.  Lots of SI ongoing Memory might be a little better. Asks how he responded to [employer].  Will check paper chart.   Psych med hx extensive including  ECT and  risperidone, Zyprexa 30 Latuda 80 which caused akathisia,  Vraylar, Rexulti, aripiprazole 20 mg with akathisia,  Seroquel 1000 mg,  InVega, Geodon, Saphris with side effects, symbyax, Fanapt NR .  Caplyta SE and markedly worse. lithium 1200 SE,  lamotrigine 300 mg, Depakote 2000 mg, Tegretol, Trileptal and several of these in combinations, gabapentin,  N-acetylcysteine, Nuedexta,     Belsomra with no response,  Lunesta no response, trazodone 200 mg,  Xanax, clonazepam, lorazepam less sedation. Buspirone NR Clonidine SE with 2 trials  Viibryd 40 mg for 3 months with diarrhea, protriptyline with side effects,  Trintellix 20 mg,  Parnate 50 mg with no response,   imipramine, venlafaxine,  Emsam 12 mg for 2 months,   bupropion was side effects,  Lexapro 20 mg, sertraline, paroxetine, Deplin, fluoxetine 80 Auvelity NR at one daily.  SE BID  methylphenidate 60 mg,  Vyvanse, Concerta, strattera, , modafinil,  Memantine worsening SI? Donepezil, complained of HI, Exelon 3 BID  pramipexole,  amantadine ,  Patient prone to akathisia.   Review of Systems:  Review of Systems  Cardiovascular:  Negative for palpitations.  Musculoskeletal:  Positive for back pain.  Neurological:  Positive for tremors.       Fidgety  Psychiatric/Behavioral:  Positive for decreased concentration and suicidal ideas. Negative for agitation, behavioral problems, confusion, dysphoric mood, hallucinations, self-injury and sleep disturbance. The patient is nervous/anxious. The patient is not hyperactive.     Medications: I have reviewed the patient's  current medications.  Current Outpatient Medications  Medication Sig Dispense Refill   acetaminophen (TYLENOL) 500 MG tablet Take 1,000 mg by mouth every 6 (six) hours as needed for moderate pain.     Dextromethorphan HBr 15 MG CAPS Take 1 capsule (15 mg total) by mouth 2 (two) times daily. 60 capsule 0   fenofibrate (TRICOR) 145 MG tablet Take 145 mg by  mouth daily.     fluticasone (FLONASE) 50 MCG/ACT nasal spray Place 1 spray into both nostrils daily as needed for allergies or rhinitis.     hydrocortisone (ANUSOL-HC) 2.5 % rectal cream Place 1 application rectally 3 (three) times daily as needed for hemorrhoids.     levothyroxine (SYNTHROID, LEVOTHROID) 75 MCG tablet Take 75 mcg by mouth daily before breakfast.     lisinopril (ZESTRIL) 5 MG tablet Take 5 mg by mouth daily.     lithium carbonate (ESKALITH) 450 MG CR tablet Take 1 tablet (450 mg total) by mouth at bedtime. 90 tablet 1   LORazepam (ATIVAN) 0.5 MG tablet TAKE ONE TABLET BY MOUTH FOUR TIMES DAILY 120 tablet 0   meclizine (ANTIVERT) 25 MG tablet Take 1 tablet (25 mg total) by mouth 3 (three) times daily as needed for dizziness. 30 tablet 1   memantine (NAMENDA) 10 MG tablet Take by mouth.     OLANZapine (ZYPREXA) 10 MG tablet TAKE ONE TABLET BY MOUTH AT BEDTIME 90 tablet 0   OLANZapine (ZYPREXA) 20 MG tablet Take 1 tablet (20 mg total) by mouth at bedtime. 90 tablet 1   PARoxetine (PAXIL) 40 MG tablet TAKE 1 TABLET BY MOUTH ONCE DAILY *DISCONTINUE THE 20MG * 90 tablet 0   polycarbophil (FIBERCON) 625 MG tablet Take 1,250 mg by mouth daily.     QUEtiapine (SEROQUEL) 200 MG tablet TAKE ONE TABLET BY MOUTH AT BEDTIME 90 tablet 0   sildenafil (VIAGRA) 100 MG tablet Take 1 tablet (100 mg total) by mouth daily as needed for erectile dysfunction. 30 tablet 6   tamsulosin (FLOMAX) 0.4 MG CAPS capsule Take 1 capsule (0.4 mg total) by mouth daily. 90 capsule 3   rivastigmine (EXELON) 4.5 MG capsule Take 1 capsule (4.5 mg total) by mouth 2 (two) times daily. 60 capsule 1   No current facility-administered medications for this visit.   Medication Side Effects: Other: mild sleepiness.   Occ twitches.\, tremor  Allergies:  Allergies  Allergen Reactions   Meloxicam Other (See Comments)    Other reaction(s): Dizziness   Nsaids     Other reaction(s): Hallucination, Other (See Comments)    Ibuprofen     Can not take because taking lithium   Prednisone     Can't sleep    Wellbutrin [Bupropion]     Suicide thoughts    Past Medical History:  Diagnosis Date   ADHD (attention deficit hyperactivity disorder)    Anemia    Anxiety    Bipolar disorder (HCC)    BPH (benign prostatic hyperplasia)    Chronic kidney disease, stage 3b (HCC)    DDD (degenerative disc disease), cervical    Depression    Deviated septum    ED (erectile dysfunction)    GERD (gastroesophageal reflux disease)    Graves disease    History of hiatal hernia    Hypertension    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    Hypothyroidism    Pneumonia    PONV (postoperative nausea and vomiting)     Family History  Problem Relation  Age of Onset   Prostate cancer Neg Hx    Bladder Cancer Neg Hx    Kidney cancer Neg Hx     Social History   Socioeconomic History   Marital status: Single    Spouse name: Not on file   Number of children: Not on file   Years of education: Not on file   Highest education level: Not on file  Occupational History   Not on file  Tobacco Use   Smoking status: Former    Types: Cigarettes    Passive exposure: Past   Smokeless tobacco: Former    Types: Associate Professor Use: Never used  Substance and Sexual Activity   Alcohol use: Not Currently   Drug use: Never   Sexual activity: Yes  Other Topics Concern   Not on file  Social History Narrative   Not on file   Social Determinants of Health   Financial Resource Strain: Not on file  Food Insecurity: Not on file  Transportation Needs: Not on file  Physical Activity: Not on file  Stress: Not on file  Social Connections: Not on file  Intimate Partner Violence: Not on file    Past Medical History, Surgical history, Social history, and Family history were reviewed and updated as appropriate.   Please see review of systems for further details on the patient's review from today.    Objective:   Physical Exam:  BP 122/83   Pulse (!) 106   Physical Exam Constitutional:      General: He is not in acute distress.    Appearance: He is well-developed.  Musculoskeletal:        General: No deformity.  Neurological:     Mental Status: He is alert and oriented to person, place, and time.     Cranial Nerves: No dysarthria.     Motor: Tremor present.     Coordination: Coordination normal.     Comments: Slight tremor  Psychiatric:        Attention and Perception: Attention and perception normal. He does not perceive auditory or visual hallucinations.        Mood and Affect: Mood is anxious and depressed. Affect is not labile, blunt, angry, tearful or inappropriate.        Speech: Speech normal. Speech is not slurred.        Behavior: Behavior normal. Behavior is not slowed. Behavior is cooperative.        Thought Content: Thought content is not delusional. Thought content includes suicidal ideation. Thought content does not include homicidal ideation. Thought content does not include suicidal plan.        Cognition and Memory: Cognition normal. He exhibits impaired recent memory.        Judgment: Judgment normal.     Comments: Insight fair He is chronically anxious  worse with less lithium No manic signs noted. Some wordfinding problems ongoing. Intrusive thoughts lately Chronically down maybe worse More SI but can kick them out     Lab Review:     Component Value Date/Time   NA 139 02/26/2021 1253   NA 142 03/20/2014 0514   K 4.0 02/26/2021 1253   K 4.1 03/20/2014 0514   CL 109 02/26/2021 1253   CL 112 (H) 03/20/2014 0514   CO2 22 02/26/2021 1253   CO2 26 03/20/2014 0514   GLUCOSE 83 02/26/2021 1253   GLUCOSE 99 03/20/2014 0514   BUN 13 02/26/2021 1253   BUN 7  03/20/2014 0514   CREATININE 1.56 (H) 02/26/2021 1253   CREATININE 0.78 03/20/2014 0514   CALCIUM 9.6 02/26/2021 1253   CALCIUM 9.5 03/20/2014 0514   PROT 7.8 08/11/2020 1750   PROT 7.3  03/18/2014 1459   ALBUMIN 4.7 08/11/2020 1750   ALBUMIN 3.2 (L) 03/18/2014 1459   AST 23 08/11/2020 1750   AST 16 03/18/2014 1459   ALT 16 08/11/2020 1750   ALT 16 03/18/2014 1459   ALKPHOS 34 (L) 08/11/2020 1750   ALKPHOS 105 03/18/2014 1459   BILITOT 0.5 08/11/2020 1750   BILITOT 0.3 03/18/2014 1459   GFRNONAA 51 (L) 02/26/2021 1253   GFRNONAA >60 03/20/2014 0514   GFRAA 54 (L) 04/18/2019 1612   GFRAA >60 03/20/2014 0514       Component Value Date/Time   WBC 6.2 02/26/2021 1253   RBC 4.17 (L) 02/26/2021 1253   HGB 13.3 02/26/2021 1253   HGB 12.6 (L) 03/20/2014 0514   HCT 39.4 02/26/2021 1253   HCT 38.4 (L) 03/20/2014 0514   PLT 431 (H) 02/26/2021 1253   PLT 346 03/20/2014 0514   MCV 94.5 02/26/2021 1253   MCV 89 03/20/2014 0514   MCH 31.9 02/26/2021 1253   MCHC 33.8 02/26/2021 1253   RDW 13.1 02/26/2021 1253   RDW 12.5 03/20/2014 0514   LYMPHSABS 3.2 03/20/2014 0514   MONOABS 1.0 03/20/2014 0514   EOSABS 0.4 03/20/2014 0514   BASOSABS 0.1 03/20/2014 0514    Lithium Lvl  Date Value Ref Range Status  08/12/2021 0.9 0.5 - 1.2 mmol/L Final    Comment:    A concentration of 0.5-0.8 mmol/L is advised for long-term use; concentrations of up to 1.2 mmol/L may be necessary during acute treatment.                                  Detection Limit = 0.1                           <0.1 indicates None Detected    08/12/21 lithium level 0.9 on 450 mg.  lithium level Sept 0.8.   lithium level July 27, 2018 was normal at 1.0.   Lithium level [facility] October 03, 2018 = 1.2. Said he got lithium level at [facility] as requested.  Labs not in Epic.  Recent lipids ok except higher TG than usual.  Normal A1C.  No results found for: "PHENYTOIN", "PHENOBARB", "VALPROATE", "CBMZ"   .res Assessment: Plan:    Cardale was seen today for follow-up, depression, anxiety and memory loss.  Diagnoses and all orders for this visit:  Severe bipolar I disorder, current or most recent episode  depressed (HCC)  Mild cognitive impairment -     rivastigmine (EXELON) 4.5 MG capsule; Take 1 capsule (4.5 mg total) by mouth 2 (two) times daily.  Generalized anxiety disorder  Panic disorder with agoraphobia  Insomnia due to mental condition  Lithium use  Forgetfulness    Chronic TR bipolar mixed and chronic anxiety.  He usually has mixed bipolar symptoms which we have not been able to completely eliminate.  See long list of meds tried.   He is somewhat chronically unstable but better on olanzapine worse if reduces quetiapine quickly.  Discussed mother's concerns about his memory which is primarily a short-term memory issue.  We discussed the alternative options of using Namenda off label for mild cognitive impairment.  We are not yet able to reduce the dose of lorazepam but that could potentially help as well.  His memory was much worse on Xanax than lorazepam. We discussed the short-term risks associated with benzodiazepines including sedation and increased fall risk among others.  Discussed long-term side effect risk including dependence, potential withdrawal symptoms, and the potential eventual dose-related risk of dementia. Disc newer studies refute dementia risks.  Unfortunately due to the severity of set his symptoms polypharmacy is a necessity.  Disc unusual combo antipsychotics but it helps more than other options.  Discussed potential metabolic side effects associated with atypical antipsychotics, as well as potential risk for movement side effects. Advised pt to contact office if movement side effects occur.  He had some falling around the time when he previously took olanzapine but he took it for several months at this dosage before he had any side effect issues and he was on Xanax at the time that the following occurred. Take LED Seroquel to sleep.   No obvious alternatives for sleep  Lithium being used bc chronic SI and death thoughts.   Counseled patient regarding  potential benefits, risks, and side effects of lithium to include potential risk of lithium affecting thyroid and renal function.  Discussed need for periodic lab monitoring to determine drug level and to assess for potential adverse effects.  Counseled patient regarding signs and symptoms of lithium toxicity and advised that they notify office immediately or seek urgent medical attention if experiencing these signs and symptoms.  Patient advised to contact office with any questions or concerns. Continue lithium  450 mg daily Lithium level 0.7 on 3/115/23 on 300 mg daily Call if death thoughts worsen or worsening SI.   Disc SE. Disc combo with Seroquel may lower response rate but he doesn't think he can sleep without Seroquel.  continue olanzapine to 30 mg nightly  for more racing thoughts and anxiety with less lithium DT severity of sx and difficulty getting off Seroquel conside rincrease.  Disc in detail again clozapine.  Disc risk in detail including low WBC with complication, myocarditis.  Extensive discussion of CBC monitoring.  Disc this again.  He doesn't want to do this now bc has to make so many other med changes to work this.  Disc WD  and may need to go lower before he can stop it. continue Seroquel 200 mg .  Would like to see him taper off if possible.  Continue  paroxetine 40 mg daily for depression and anxiety DT both being TR This does contribute to DDI.  Consider fluvoxamine Did he take duloxetine?  Might help depression more  But for now continue lorazepam 0.5 mg QID, but use LED for cog reasons  Disc  off label use of DM for depression based on MOA of Auvelity and use of paroxetine to prolong half life of DM. Presence of paroxetine may have been reason he didn't tolerate Auvelity brief trial before.   He is more anxious but not clear if this is coincidental. Stop dextromethorphan to see if it is causing the anxiety  Wait 1 week then increase rivastigmine for memory to 4.5  mg twice daily.  We discussed the short-term risks associated with benzodiazepines including sedation and increased fall risk among others.  Discussed long-term side effect risk including dependence, potential withdrawal symptoms, and the potential eventual dose-related risk of dementia.  But recent studies from 2020 dispute this association between benzodiazepines and dementia risk. Newer studies in 2020 do not support  an association with dementia.  Discussed safety plan at length with patient.  Advised patient to contact office with any worsening signs and symptoms.  Instructed patient to go to the [facility] emergency room for evaluation if experiencing any acute safety concerns, to include suicidal intent.  He commits to safety.  Disc Ozempic and Monjauro for weight loss.  Has had weight gain from meds as a contributor. But lately has lost some weight.  Disc Dr. Pollie Meyer mentioned evidence it could help depression  Needs to keep up activity as much as possible for depression.  Take Miralax daily for constipation.  Could be SE Seroquel.  This appt was 30 mins.  FU 4 weeks   Meredith Staggers, MD, DFAPA  Future Appointments  Date Time Provider Department Center  05/06/2022 11:00 AM Cottle, Steva Ready., MD [facility] None  06/07/2022  1:00 PM Cottle, Steva Ready., MD [facility] None  12/30/2022  2:30 PM Sondra Come, MD [facility] None    No orders of the defined types were placed in this encounter.      -------------------------------

## 2022-03-17 NOTE — Telephone Encounter (Signed)
fyi

## 2022-03-24 ENCOUNTER — Telehealth: Payer: Self-pay | Admitting: Psychiatry

## 2022-03-24 NOTE — Telephone Encounter (Signed)
Pt informed

## 2022-03-24 NOTE — Telephone Encounter (Signed)
Pt called having suicidal thoughts and can't be still. Dose increased on Rivastigmine  11/15 to 9 mg. Not sure if the cause. Call ASAP [phone]

## 2022-03-24 NOTE — Telephone Encounter (Signed)
Pt stated he increased med to 4.5 mg bid and now he can only sleep for short periods of time,has akathisia,and suicidal thoughts.That's the only change that has been made.Please advise

## 2022-03-24 NOTE — Telephone Encounter (Signed)
Stop the rivastigmine since the [place] appears to be having side effects from it.  He was having anxiety at the last appointment 2 and then we increased the dose.  This might be causing the anxiety to be worse. He has chronic suicidal thoughts without intent or plan.  However it appears the thoughts are more intense with the rivastigmine

## 2022-03-30 ENCOUNTER — Other Ambulatory Visit: Payer: Self-pay | Admitting: Psychiatry

## 2022-03-30 DIAGNOSIS — F314 Bipolar disorder, current episode depressed, severe, without psychotic features: Secondary | ICD-10-CM

## 2022-03-30 DIAGNOSIS — F4001 Agoraphobia with panic disorder: Secondary | ICD-10-CM

## 2022-03-30 DIAGNOSIS — F411 Generalized anxiety disorder: Secondary | ICD-10-CM

## 2022-03-30 NOTE — Telephone Encounter (Signed)
Last filled 10/16 appt 1/4

## 2022-04-02 ENCOUNTER — Emergency Department
Admission: EM | Admit: 2022-04-02 | Discharge: 2022-04-02 | Disposition: A | Discharge status: Home / Self Care | Payer: Medicare PPO | Attending: Emergency Medicine | Admitting: Emergency Medicine

## 2022-04-02 ENCOUNTER — Encounter: Payer: Self-pay | Admitting: Emergency Medicine

## 2022-04-02 ENCOUNTER — Other Ambulatory Visit: Payer: Self-pay

## 2022-04-02 DIAGNOSIS — K5901 Slow transit constipation: Secondary | ICD-10-CM | POA: Diagnosis not present

## 2022-04-02 DIAGNOSIS — K5900 Constipation, unspecified: Secondary | ICD-10-CM | POA: Diagnosis present

## 2022-04-02 MED ORDER — DOCUSATE SODIUM 100 MG PO CAPS: 100.0000 mg | ORAL_CAPSULE | Freq: Two times a day (BID) | ORAL | 2 refills | Status: DC

## 2022-04-02 MED ORDER — BISACODYL 10 MG RE SUPP: 10.0000 mg | RECTAL | 0 refills | Status: DC | PRN

## 2022-04-02 NOTE — Discharge Instructions (Signed)
Please take magnesium citrate bottle today, along with MiraLAX, and a Dulcolax suppository  Repeat this daily until you have a bowel movement.  Once you have a bowel movement start taking MiraLAX every day as well as Colace 100 mg twice a day.  If you go more than 2 days without having a bowel movement you need to take magnesium citrate bottle

## 2022-04-02 NOTE — ED Provider Notes (Signed)
   [facility] Provider Note    Event Date/Time   First MD Initiated Contact with Patient 04/02/22 [phone]     (approximate)   History   Constipation   HPI  Albert [name] is a 56 y.o. male who presents with complaints of constipation.  Patient reports a history of chronic constipation, he says he usually only has a bowel movement once a week.  He reports his been 2-week since his last bowel movement.  He reports discomfort in his lower abdomen, no nausea or vomiting.  He occasionally uses MiraLAX     Physical Exam   Triage Vital Signs: ED Triage Vitals  Enc Vitals Group     BP 04/02/22 0905 (!) 124/94     Pulse Rate 04/02/22 0905 92     Resp 04/02/22 0905 18     Temp 04/02/22 0905 98.3 F (36.8 C)     Temp Source 04/02/22 0905 Oral     SpO2 04/02/22 0905 96 %     Weight 04/02/22 0924 87.1 kg (192 lb 0.3 oz)     Height 04/02/22 0924 1.753 m (5\' 9" )     Head Circumference --      Peak Flow --      Pain Score 04/02/22 0901 6     Pain Loc --      Pain Edu? --      Excl. in GC? --     Most recent vital signs: Vitals:   04/02/22 0905  BP: (!) 124/94  Pulse: 92  Resp: 18  Temp: 98.3 F (36.8 C)  SpO2: 96%     General: Awake, no distress.  CV:  Good peripheral perfusion.  Resp:  Normal effort.  Abd:  No distention.  Other:     ED Results / Procedures / Treatments   Labs (all labs ordered are listed, but only abnormal results are displayed) Labs Reviewed - No data to display   EKG     RADIOLOGY     PROCEDURES:  Critical Care performed:   Procedures   MEDICATIONS ORDERED IN ED: Medications - No data to display   IMPRESSION / MDM / ASSESSMENT AND PLAN / ED COURSE  I reviewed the triage vital signs and the nursing notes. Patient's presentation is most consistent with exacerbation of chronic illness.  Patient with a history of constipation presents with constipation today.  Abdominal exam is overall reassuring.  Not  consistent with SBO.  No fecal impaction suspected.  Discussed the importance of daily MiraLAX, Colace to prevent such constipation.  For now will do magnesium citrate, Dulcolax suppositories, MiraLAX p.o. until bowel movement.  No indication for enema at this time, appropriate for discharge with outpatient follow-up        FINAL CLINICAL IMPRESSION(S) / ED DIAGNOSES   Final diagnoses:  Slow transit constipation     Rx / DC Orders   ED Discharge Orders          Ordered    bisacodyl (DULCOLAX) 10 MG suppository  As needed        04/02/22 0938    docusate sodium (COLACE) 100 MG capsule  2 times daily        04/02/22 14/01/23             Note:  This document was prepared using Dragon voice recognition software and may include unintentional dictation errors.   [id number], MD 04/02/22 [phone]

## 2022-04-02 NOTE — ED Triage Notes (Signed)
Pt comes with c/o constipation for 2 weeks now. Pt states hx of this and usually has to have enema or soap suds.

## 2022-05-01 ENCOUNTER — Other Ambulatory Visit: Payer: Self-pay | Admitting: Psychiatry

## 2022-05-06 ENCOUNTER — Ambulatory Visit (INDEPENDENT_AMBULATORY_CARE_PROVIDER_SITE_OTHER): Payer: Medicare PPO | Admitting: Psychiatry

## 2022-05-06 ENCOUNTER — Encounter: Payer: Self-pay | Admitting: Psychiatry

## 2022-05-06 VITALS — BP 131/90 | HR 98

## 2022-05-06 DIAGNOSIS — F4001 Agoraphobia with panic disorder: Secondary | ICD-10-CM | POA: Diagnosis not present

## 2022-05-06 DIAGNOSIS — F411 Generalized anxiety disorder: Secondary | ICD-10-CM | POA: Diagnosis not present

## 2022-05-06 DIAGNOSIS — G3184 Mild cognitive impairment, so stated: Secondary | ICD-10-CM | POA: Diagnosis not present

## 2022-05-06 DIAGNOSIS — R6889 Other general symptoms and signs: Secondary | ICD-10-CM

## 2022-05-06 DIAGNOSIS — Z79899 Other long term (current) drug therapy: Secondary | ICD-10-CM

## 2022-05-06 DIAGNOSIS — F314 Bipolar disorder, current episode depressed, severe, without psychotic features: Secondary | ICD-10-CM

## 2022-05-06 DIAGNOSIS — F5105 Insomnia due to other mental disorder: Secondary | ICD-10-CM

## 2022-05-06 MED ORDER — PAROXETINE HCL 20 MG PO TABS: ORAL_TABLET | ORAL | 0 refills | Status: DC

## 2022-05-06 MED ORDER — FLUVOXAMINE MALEATE 50 MG PO TABS: 50.0000 mg | ORAL_TABLET | Freq: Two times a day (BID) | ORAL | 0 refills | Status: DC

## 2022-05-06 NOTE — Progress Notes (Signed)
Albert [name] [id number] 1966/01/24 57 y.o.    Subjective:   Patient ID:  Albert [name] is a 57 y.o. (DOB 07-16-1965) male.  Chief Complaint:  Chief Complaint  Patient presents with   Follow-up    Severe bipolar I disorder, current or most recent episode depressed (HCC)   Depression   Anxiety    Depression        Associated symptoms include decreased concentration and suicidal ideas.  Past medical history includes anxiety.   Anxiety Symptoms include decreased concentration, nervous/anxious behavior and suicidal ideas. Patient reports no chest pain, confusion or palpitations.      Albert [name] presents to the office today for follow-up of mixed bipolar, anxiety and still grieving stress of breakup.  He requires frequent follow-up because of long-term symptoms.  At visit October 25, 2018.  We made several medicine changes because of his concerns about sleep and other issues.  We increased olanzapine back to 7.5 mg bc not sleeping as well at 5 mg.  He has to have the prescription for quetiapine written for up to 3 tablets at night in case insomnia is worse because insomnia makes his mood disorder so much worse.  We discussed that was above the usual max but the request was granted given his treatment resistant status. We also reduce lithium from 900 mg daily to 750 mg daily to try to reduce tremor and muscle twitches.  At  visit March 28, 2019.  Fluoxetine was increased to 40 mg daily.   Early January 2021 increased to 60 mg daily.  No SE.  seen June 28, 2019.  No further meds were changed except olanzapine was increased back to 10 mg daily to see if if he could get additional benefit per his request.  Covid vaccinated.  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  Then had cholecystectomy.  April 2021 appt with the following noted: 2 episodes night sweats lately.  Memory has been very bad lately.  Repeatedly asks mother questions. Still  tend to stay in his room and his bed.  Still has rapid  cycling mood swings.  Maybe some better with increase in olanzapine to 10 and tolerating itl.  Would like to get out more but can't DT Covid.  Can perform necessary chores.  Will get out of the house when he can.  No change in death thoughts and anxiety in intensity but is better with frequency.  Usually comes and goes in waves but more persistent.  Consistent with meds.  Ativan not helping anxiety very dramatically but he's not sure.  Fidgety.  No trigger other than still grieving relationship and can't get it out of his head.  Poor energy, concentration and more forgetful. In bed more and less active.  Sleep ok lately which is unusual.  Can concentrate on financial matters and stock market at times.  Tolerating meds.  Still some intrusive SI without reason. Less frequent obsessive thoughts about broken relationship and has been doing this for months.  Can't let it go.  Loop.   No unusual stress even with the family who is supportive. Likes the benefit that Zyprexa gives. No sleepwalking nor falling nor odd behavior.  No history of sleep walking.  He understands this may recur with an increase.  Also wants option to rarely take extra quetiapine 300 for sleep prn. Plan:  Try to reduce lorazepam if possible.  10/22/2019 appointment with the following noted: Continues fluoxetine 60, lithium 600 mg, lorazepam 2 mg  AM and HS and 1 mg midday, olanzapine 10, quetiapine 600 mg HS. Still anxious chronically and including driving in crowded spaces.  Doesn't thing Ativan helps as well as Xanax but less cognitive problems. Sleep is not as good.  Occ EFA.  More EMA and wanting to do things in the middle of the night but this is not typical.  Wonders about why that happens.  Some napping.   Tolerating meds.  Asks about weight loss meds.  Disc this in detail.   Plan no changes except OK meclizine prn vertigo.  02/11/20 appt with the following noted: Able to gradually reduce lorazepam to 1 mg AM and HS. More  depressed over time and less interested in things and less interest in going out but does with his parents. Has reduced from 800 to 600 mg HS with some awakening but usually able to go back to sleep.[name].  Spending a good amount of time in bed bc watches TV in bedroom.  Parents watch TV in different part of the house.  No mood swings he notices.   Concerns about weight gain about 200#. Likes olanzapine's benefit for sleep. Plan: Trial for TRD to  Increase fluoxetine to 80 mg daily  to use the combo with olanzapine for TR bipolar depression.   04/21/20 appt with following noted: I thought in beginning some benefit with fluoxetine.  Lifelong negative thinking continues.   Not much bipolar.  Reads a lot on bipolar.   Still tired and anxious a little more may be seasonal.  No familial stressors.  Tends to lay down in afternoon.  Anxiety not over anything in particular.   No SE with fluoxetine. Took meclizine prn.  Easily motion sick.    Asks questions about newer drugs for bipolar like Caplyta. Plan:  No med changes  06/30/20 appt noted: Tired of wearing masks.  Vaccinated.  Asked questions about when this will end with mask mandate.  Mood up and down some since here and getting up 2-3 times.  Disc awakening problems.  Trying to do better bc night eating some.   Mood is about average today so far.  Albert FlesherWent out with friends for breakfast.  Recognizes activity helps mood.   3 days in a row napped a lot in the afternoon.   Bed is a comfort place.  Gets bored and hard to motivate.  Tries to stay away from night eating and spending.   More depressed when alone and wonders about med changes. Plan: Failed response to olanzapine + fluoxetine for TR bipolar depression.   Reduce fluoxetine to 1 daily and reduce olanzapine to one half nightly for 10 days and then stop it. Then start Caplyta 1 daily  08/27/2020 appointment with the following noted: Patient decompensated with the above switch to Caplyta with  intrusive suicidal thoughts and had to be hospitalized for psychiatric reasons.  Multiple phone calls with family members since that time.  Spoke with the psychiatric nurse practitioner with the decision to restart olanzapine in place of the Caplyta given the patient was more stable while on the combination of Seroquel and olanzapine than with the Caplyta. Caplyta triggered HI/SI and depressed and confused on it, and agitated and still has some of it now. No akathisia. Frustrated with lack of therapy at the hospital.   Hydroxyzine didn't help jitteriness.  Feels some better.  Fleeting SI & HI but not obsessive like it was prior to hospitalization.  Feels a little amped up and nervous.  Scared  of what could happen.  Apprehensive being here talking about things. 5-6 hours sleep last night and would like to have more. At mom and dad's house right now and up more in the day. Inadvertently stopped lorazepam abruptly by mistake likely contributing to shakiness. Still some memory issues.  Trying to stay out of bed when watching TV.  Some awakening but managing. Occ fleeting SI and distracts himself.   Always spends a lot of time just laying around.   Can have anxiety for no reason like coming here, and varies in intensity without pattern.      Less panic than in the past.  Some chronic depression and hyperactivity and loudness and hyperverbal.  Usually back to sleep.  Total 6 hours but some napping, awakens 2-4 times nightly but back to sleep..  Taking quetiapine 600 mg HS.  still lacks interest and motivation.  Can follow a TV show if interested. Plan: Restart lorazepam 1 mg in the morning, 1 mg in the afternoon and 1 mg at night for anxiety Stop hydroxyzine  Increase olanzapine to 1 and 1/2 of 10 mg tablets in evening  09/05/2020 appt noted: seen with parents today at his request Made med changes noted and tolerated the changes Better than I did last week. Now only fleeting HI/SI and "no where near what it  was".  Sleeping better.  Still not a lot of energy.  Anhedonia.  Less agitation.  A lot of anxiety all the time but it's helping.  Would like more energy and motivation.   Doesn't handle stress well. Parents note he's less shakey.  He's still staying with his parents.  Only driven once since this happened and F wants him to be able to drive and function more independently.  M agrees he's better.  Memory is better but not normal.  Primarily STM problems No akathisia with olanzapine this time.    Administering his own meds but mo watched. Slept 8 hours last night. Plan consider further reductions in quetiapine  10/17/20 appt noted: Stayed on Seroquel 600 HS and olanzapine 15. Olanzapine seemed to do most for the sleep.  Also helping eliminated HI/SI for the most part.  Wants to try to increase it.  Depression is much better with less rumination also.  Fears akathisia at higher dose as in the past. Also on fluoxetine 40.   Plan: Reduce Seroquel to 1 and 1/2 tablets at night Increase olanzapine to 1 of the 15 mg tablet and 1/2 of the 5 mg tablet for 1 week,  Then, if tolerated increase the olanzapine to 20 mg or 1 of the 15 mg tablets and 1 of the 5 mg tablets. If possible then also reduce quetiapine to 1 tablet at night.  11/05/2020 appointment with the following noted: Up to olanzapine 20 mg hs for 3 days.  Reduced Serooquel to 450 mg HS.  Didn't try to go lower. Had hiatal hernia and umbilical hernia surgery recently with no problem.   No akathisia so far. Sleep ok with meds so far changes.  No mood changes yet but overall likes the smoothness of olanzapine and helping sleep without knociing him out. Still some occ HI/SI, he thinks bc of anxiety generally.  Easily anxious. Plan: Continue olanzapine 20 mg nightly longer to give it time to help mood sx reduce quetiapine to 1 tablet at night to minimize polypharmacy and reduce risk of akathisia.  01/07/21 appt noted: Able to reduce quetiapine 300 mg  HS ok with decent  sleep but still wakes a lot.  No AM hangover.  Don't have a lot to do.  Can enjoy going out and doing things.  But doesn't do it longterm.  Family is doing good.  Father started year 41 at his job.   Tolerating the meds well.  May wake more with quetiapine reduction. Lost 10#. Wonders if could try other meds he didn't tolerate before bc now can tolerate olanzapine and couldn't tolerate it in the past DT akathisia. Mood never great but can swing into depresssion without reason.   Plan: Continue olanzapine 20 mg nightly longer to give it time to help mood sx reduce quetiapine to 1/2 of the 300 mg  tablet at night to minimize polypharmacy and reduce risk of akathisia.  02/02/2021 appointment with the following noted: Tried reduced quetiapine to 150 mg and after a week had withdrawal sx including jittery and SI and he increased to 250 mg daily.  Felt better after increasing to 300 mg daily and last week reduced to 250 mg daily.  After increase quetiapine felt better in a few days.  Seems more alert with less quetiapine but can't tolerate a quick withdrawal.   Some awakening.  Plan: Reduce quetiapine by 50 mg every 2-4 weeks.  03/09/2021 phone call: Pt called and said that he is weaning off the seroquel. He was down to 200 mg of the seoquel and he started having withdrawal symptoms and sucidal thoughts. He went back up to 250 mg because he knew he was good on that.    Pt stated he starting back taking 250 mg on Saturday due to the symptoms he was having.Now he is no longer having suicidal thoughts but still very jittery. MD: He may just need to taper slower than most people.  If he can tolerate the 250 mg Seroquel with the jitteriness it should get better over the next 1 to 2 weeks.  Then he can try going down again by 50 mg at a time.  He probably needs to go down by just 50 mg every 2 to 4 weeks and wait till he fully adjusts to each reduction before he reduces again.  03/17/21 appt  noted: Hernia surgery 2 weeks ago. Seroquel 250 mg for a month then 200 mg daily and had SI and increased again to 300 mg daily.  Would love to get off it.  Withdrawal triggers intrusive HI/SI but otherwise doesn't have them No SE. Sleep is OK but not enough REM sleep.  Satisfied with other meds.   No current HI/SI.  No paranoia.  Still has anxiety and taking Ativan every 6 hours.  It works but doesn't cure it. Chronic anxiety and ask about ketamine for chronic depression. Plan: Because he had WD reducing from 250 to 200 mg daily will have to go slowly. Reduce quetiapine by 25 mg every 2-4 weeks. Reduce Seroquel to 250 mg for 2 weeks, Then reduce to 225 mg for 2 weeks, Then reduce to 200 mg for 2 weeks, Then reduce to 175 mg for 2 weeks, Then reduce to 150 mg Also: Buspirone 30 mg tablets for anxiety Start 1/3 tablet twice daily for 1 week Then increase to two thirds twice daily for 1 week Then increase to 1 tablet twice daily  04/15/2021 appointment with the following noted: Reduced Seroquel to 200 mg HS and increased buspirone to 30 mg BID STM px and wants to try to reduce Ativan to see if it is better.  But fears with  drawal sx. Parents complain about his memory. Less HI and SI since here. Less depressed. Plan: Buspirone 30 mg tablets for anxiety Start 1/3 tablet twice daily for 1 week Then increase to two thirds twice daily for 1 week Then increase to 1 tablet twice daily Continue olanzapine 20 mg nightly longer to give it time to help mood sx Because he had WD reducing from 250 to 200 mg daily will have to go slowly. Reduce quetiapine by 25 mg every 2-4 weeks. Reduce Seroquel to 250 mg for 2 weeks, Then reduce to 225 mg for 2 weeks, Then reduce to 200 mg for 2 weeks, Then reduce to 175 mg for 2 weeks, Then reduce to 150 mg Trial taper lorazepam: In hopes of improving short-term memory  04/30/2022 phone call: Complaining of withdrawal symptoms reducing lorazepam from 0.5  mg 5 times a day to 0.5 mg 4 times daily.  05/21/2021 appointment with the following noted: Gotten onto buspar 30 BID and down on lorazepam to 0.5 mg QID. Gotten down to Seroquel 200 mg HS A little restless every now and then but able to reduce lorazepam for the last weeks. Is too inactive and lays in bed too much watching TV.   Can' t tell effect of buspirone bc of other changes.  Sleep with awakening with 6-7 hours. Fleeting SI without trigger or plan or intent. Depressed more than anxious. Enjoys watching stocks and investing. Plan: Trial taper lorazepam gradually over time In hopes of improving short-term memory But for now continue lorazepam 0.5 mg QID Start clonidine 0.1 mg tablets one half at night for 3 nights, then 1/2 tablet twice daily for 3 nights, then one half in the morning and 1 tablet at night If clonidine does not noticeably help anxiety call the office  05/27/2021 phone call initially complaining of homicidal and suicidal thoughts after taking 1 mg of clonidine.  He was told to stop the medication.  But then called back stating he was having homicidal and suicidal thoughts from withdrawal from the medication which was not logical.  He agreed it was not logical and stated on 05/29/2021 that he felt better  06/23/2021 appointment the following noted: No clonidine. Sunday panic attack eating out and then having SI.  Stayed with father to feel safer. Intrusive thoughts of hurting parents or others.  HI only when has SI.  Usually panic without SI.  Then had intrusive thoughts about hurting others.   Usually intrusive thoughts are brief and fleeting.    07/20/21  appt noted; More anxious with less lithium.  More anxious in his body.  Not more depressed or irritable.  Sleep is the same.  More racing thoughts.  Chronic SI probably a litte worse but not unmanageable. Rduced lithium last week to 300 mg daily. No recent change in lisinopril. Off the clonidine for a week or so. Doesn't  drink much water.  Drinks a lot of diet coke. No marked akathisia or RLS but does shake his leg DT anxiety. Worries about needing mor emedicine. Plan: Increase olanzapine to 25 mg nightly bc more racing thoughts and anxiety with less lithium DT severity of sx and difficulty getting off Seroquel conside rincrease.  07/30/21 TC : CO racing SI today and wonders about whether increased sx racing thoughts, hyperactivity, SI related to Reduced lithium from 600 mg daily to 300 mg daily recently DT lithium level 1.8.  could be related.    Plan: increase olanzapine 30 mg pm Today take lithium 600  mg now.  Tomorrow start lithium CR 450 mg daily Continue olanzapine 30 mg PM until SI resolves unless akathisia returns.  Meredith Staggers, MD, DFAPA  08/17/21 appt noted:  No akathisia with olanzapine 30 mg pm (about 3 weeks) and is sleeping better. Increased lithium to 450 mg daily. Tolerating meds. Still racing negative thoughts maybe some better.  Asked about it. SI come and go.   Anxiety about the same as depression.  No motivation unless has to do something. Plan: Reduce  fluoxetine 20 mg 1 daily in hopes racing thoughts and anxiety are better.  09/14/2021 appointment with the following noted: No difference noted with reduction fluoxetine 20 mg daily. Most of the time negative thoughts, ex "what if I get in a wreck?"  Frequent negative thoughts.   Still on lorazepam 0.5 mg TID.   Wonders about trying prior meds that caused akathisia bc no longer gets it with olanzapine 30 mg daily. Sleep is usually ok.  Taking Seroquel 200 mg HS.  Thinks the olanzapine helps his sleep and doesn't want to stop it. Panic 1-2 times per month.  Then takes extra lorazepam. Plan: DC fluoxetine 20 mg 1 daily  Trial Auvelity off label for depression 1 in the AM for 1 week then 1 in AM and 1 with evening meal  10/15/2021 appointment with the following noted: Occ of getting confused as to day and went to church on a Thursday my  mistake.   Had it happen a few months ago too.  Memory is terrible. Thinks he couldn't tolerate more Auvelity. Not a lot of benefit from [place] 1 daily. Still quite a bit of anxiety.   A little drowsy  and sleeps 7-8 hours at night. Word finding problems.  Lost wallet. Plan: DC  Auvelity  For [facility]: memantine 1/2 tablet in the AM for 1 week, then 1/2 tablet twice daily, then 1/2 tablet in the AM and 1 tablet at night for 1 week then 1 tablet twice daily  10/22/2021 phone call complaining of additional stress asking to start new medication because of anxiety and more suicidal thoughts without intent or plan.  Reports taking more than 4 prescribed Ativan a day recently.  Asking to increase olanzapine which is already at 30 mg daily. MD response:We cannot go any higher in the dosage of olanzapine that he is currently taking.  A lot of these suicidal thoughts are apparently anxiety driven.  Let us have him resume taking paroxetine which is one of the more effective medicines for anxiety.  I will send it in and the instructions will be paroxetine 20 mg tablets 1/2 tablet daily for 1 week then 1 tablet daily  11/16/21 appt noted: Multiple phone calls since she was here.  Complaining of suicidal thoughts from memantine which was stopped. Started paroxetine and up to 20 mg daily.  Continues olanzapine 30 mg, lithium 450 mg nightly, lorazepam 0.5 mg 4 times daily , And quetiapine 200 mg nightly. Questions about whether memantine caused the neg SI really bc has them at times. When has SI often anxiety driven.  Will tend to ruminate on past relationship ended.   Edgy and irritable all the time but memantine seemed to make it worse. Mind is getting better with less SI lately.   No SE with paroxetine.   Aggrivated by forgetfulness.  eXample, talked with someone about going to breakfast last night and then forgot to go this mornin.g. Plan prescribed donepezil 5 mg daily for cognitive complaints specifically  forgetfulness  12/31/2021 appointment with the following noted: He had been prescribed donepezil 5 mg for cognitive complaints.  He called back reporting he felt it was causing homicidal thoughts and was instructed to stop it.  He has had these types of thoughts in the past.  He had no desire to act on them. Discouraged. Ongoing depression and reduced enjoyment and negative thoughts.  Can enjoy some things. A lot of anxiety chronically.  Trying to limit Ativan to 5 of 0.5 mg daily.  Sometimes needs 2. Constipation for years.  02/03/22 appt noted: Depression makes LBP worse. Ongoing chronic depression. Tolerated rivastigmine without triggering SI Chronic anxiety and Ativan helps some. Occ panic but not usual.  Asks if any other meds coud be used. Plan: But for now continue lorazepam 0.5 mg QID, but use LED for cog reasons Increase rivastigmine to 3 mg capsule 1 twice daily or 2 of the 1.5 mg capsules twice daily.  Call if you have problems with nausea Start dextromethorphan 1 capsule twice daily . Thi sis off label for depression based on MOA of Auvelity and use of paroxetine to prolong half life of DM. Presence of paroxetine may have been reason he didn't tolerate Auvelity brief trial before.  Will proceed slowly.  03/17/2022 appointment noted: Current psych meds: Rivastigmine 3 mg twice daily, quetiapine 200 mg nightly, paroxetine 40 mg daily, olanzapine 30 mg nightly, Namenda 10 mg daily, lorazepam 0.5 mg 4 times daily, lithium CR 450 mg nightly, dextromethorphan 15 mg twice daily A lot of anxiety since here.  Some days taken more lorazepam to 6-7 tablets daily.  No major triggers but anxious even about coming here and other normal things.  Cancelled trip to collect rocks with friend and cancelled the trip bc anxious.  Usually if can go he is ok.  Lots of SI ongoing Memory might be a little better. Asks how he responded to [employer] Airlines[employer].  Will check paper chart.  05/06/21 appt noted: Stopped  rivastigmine DT anxiety and worsening SI.  Was better the next day. This week had some SI.  Thinking about the prior relationship tends to lead to SI. Will take Ativan when gets SI and it helps a little at 0.5 mg at a time.    Psych med hx extensive including ECT and  risperidone, Zyprexa 30 Latuda 80 which caused akathisia,  Vraylar, Rexulti, aripiprazole 20 mg with akathisia,  Seroquel 1000 mg,  InVega, Geodon, Saphris with side effects, symbyax, Fanapt NR .  Caplyta SE and markedly worse. lithium 1200 SE,  lamotrigine 300 mg, Depakote 2000 mg, Tegretol, Trileptal and several of these in combinations, gabapentin,  N-acetylcysteine, Nuedexta,     Belsomra with no response,  Lunesta no response, trazodone 200 mg,  Xanax, clonazepam, lorazepam less sedation. Buspirone NR Clonidine SE with 2 trials  Viibryd 40 mg for 3 months with diarrhea, protriptyline with side effects,  Trintellix 20 mg,  Parnate 50 mg with no response,   imipramine, venlafaxine,  Emsam 12 mg for 2 months,   bupropion was side effects,  Lexapro 20 mg, sertraline, paroxetine, Deplin, fluoxetine 80 Auvelity NR at one daily.  SE BID  methylphenidate 60 mg,  Vyvanse, Concerta, strattera, , modafinil,  Memantine worsening SI? Donepezil, complained of HI, Exelon 3 BID  pramipexole,  amantadine ,  Patient prone to akathisia.   Review of Systems:  Review of Systems  Cardiovascular:  Negative for chest pain and palpitations.  Musculoskeletal:  Positive for back pain.  Neurological:  Positive for tremors.       Fidgety  Psychiatric/Behavioral:  Positive for decreased concentration and suicidal ideas. Negative for agitation, behavioral problems, confusion, dysphoric mood, hallucinations, self-injury and sleep disturbance. The patient is nervous/anxious. The patient is not hyperactive.     Medications: I have reviewed the patient's current medications.  Current Outpatient Medications  Medication Sig Dispense  Refill   acetaminophen (TYLENOL) 500 MG tablet Take 1,000 mg by mouth every 6 (six) hours as needed for moderate pain.     bisacodyl (DULCOLAX) 10 MG suppository Place 1 suppository (10 mg total) rectally as needed for moderate constipation. 12 suppository 0   docusate sodium (COLACE) 100 MG capsule Take 1 capsule (100 mg total) by mouth 2 (two) times daily. 60 capsule 2   fenofibrate (TRICOR) 145 MG tablet Take 145 mg by mouth daily.     fluticasone (FLONASE) 50 MCG/ACT nasal spray Place 1 spray into both nostrils daily as needed for allergies or rhinitis.     hydrocortisone (ANUSOL-HC) 2.5 % rectal cream Place 1 application rectally 3 (three) times daily as needed for hemorrhoids.     levothyroxine (SYNTHROID, LEVOTHROID) 75 MCG tablet Take 75 mcg by mouth daily before breakfast.     lisinopril (ZESTRIL) 5 MG tablet Take 5 mg by mouth daily.     lithium carbonate (ESKALITH) 450 MG ER tablet TAKE ONE TABLET BY MOUTH AT BEDTIME 90 tablet 0   LORazepam (ATIVAN) 0.5 MG tablet TAKE ONE TABLET BY MOUTH FOUR TIMES DAILY 120 tablet 1   meclizine (ANTIVERT) 25 MG tablet Take 1 tablet (25 mg total) by mouth 3 (three) times daily as needed for dizziness. 30 tablet 1   memantine (NAMENDA) 10 MG tablet Take by mouth.     OLANZapine (ZYPREXA) 10 MG tablet TAKE ONE TABLET BY MOUTH AT BEDTIME 90 tablet 0   OLANZapine (ZYPREXA) 20 MG tablet Take 1 tablet (20 mg total) by mouth at bedtime. 90 tablet 1   polycarbophil (FIBERCON) 625 MG tablet Take 1,250 mg by mouth daily.     QUEtiapine (SEROQUEL) 200 MG tablet TAKE ONE TABLET BY MOUTH AT BEDTIME 90 tablet 0   rivastigmine (EXELON) 4.5 MG capsule Take 1 capsule (4.5 mg total) by mouth 2 (two) times daily. 60 capsule 1   sildenafil (VIAGRA) 100 MG tablet Take 1 tablet (100 mg total) by mouth daily as needed for erectile dysfunction. 30 tablet 6   tamsulosin (FLOMAX) 0.4 MG CAPS capsule Take 1 capsule (0.4 mg total) by mouth daily. 90 capsule 3   fluvoxaMINE  (LUVOX) 50 MG tablet Take 1 tablet (50 mg total) by mouth 2 (two) times daily. 60 tablet 0   PARoxetine (PAXIL) 20 MG tablet 1-1/2 tablets daily for 5 days, then 1 tablet daily for 5 days, then 1/2 tablet daily for 5 days, then stop 19 tablet 0   No current facility-administered medications for this visit.   Medication Side Effects: Other: mild sleepiness.   Occ twitches.\, tremor  Allergies:  Allergies  Allergen Reactions   Meloxicam Other (See Comments)    Other reaction(s): Dizziness   Nsaids     Other reaction(s): Hallucination, Other (See Comments)   Ibuprofen     Can not take because taking lithium   Prednisone     Can't sleep    Wellbutrin [Bupropion]     Suicide thoughts    Past Medical History:  Diagnosis Date   ADHD (attention deficit hyperactivity disorder)    Anemia  Anxiety    Bipolar disorder (HCC)    BPH (benign prostatic hyperplasia)    Chronic kidney disease, stage 3b (HCC)    DDD (degenerative disc disease), cervical    Depression    Deviated septum    ED (erectile dysfunction)    GERD (gastroesophageal reflux disease)    Graves disease    History of hiatal hernia    Hypertension    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    Hypothyroidism    Pneumonia    PONV (postoperative nausea and vomiting)     Family History  Problem Relation Age of Onset   Prostate cancer Neg Hx    Bladder Cancer Neg Hx    Kidney cancer Neg Hx     Social History   Socioeconomic History   Marital status: Single    Spouse name: Not on file   Number of children: Not on file   Years of education: Not on file   Highest education level: Not on file  Occupational History   Not on file  Tobacco Use   Smoking status: Former    Types: Cigarettes    Passive exposure: Past   Smokeless tobacco: Former    Types: Associate Professor Use: Never used  Substance and Sexual Activity   Alcohol use: Not Currently   Drug use: Never   Sexual  activity: Yes  Other Topics Concern   Not on file  Social History Narrative   Not on file   Social Determinants of Health   Financial Resource Strain: Not on file  Food Insecurity: Not on file  Transportation Needs: Not on file  Physical Activity: Not on file  Stress: Not on file  Social Connections: Not on file  Intimate Partner Violence: Not on file    Past Medical History, Surgical history, Social history, and Family history were reviewed and updated as appropriate.   Please see review of systems for further details on the patient's review from today.   Objective:   Physical Exam:  BP (!) 131/90   Pulse 98   Physical Exam Constitutional:      General: He is not in acute distress.    Appearance: He is well-developed.  Musculoskeletal:        General: No deformity.  Neurological:     Mental Status: He is alert and oriented to person, place, and time.     Cranial Nerves: No dysarthria.     Motor: Tremor present.     Coordination: Coordination normal.     Comments: Slight tremor  Psychiatric:        Attention and Perception: Attention and perception normal. He does not perceive auditory or visual hallucinations.        Mood and Affect: Mood is anxious and depressed. Affect is not labile, blunt, angry, tearful or inappropriate.        Speech: Speech normal. Speech is not slurred.        Behavior: Behavior normal. Behavior is not slowed. Behavior is cooperative.        Thought Content: Thought content is not delusional. Thought content includes suicidal ideation. Thought content does not include homicidal ideation. Thought content does not include suicidal plan.        Cognition and Memory: Cognition normal. He exhibits impaired recent memory.        Judgment: Judgment normal.     Comments: Insight fair He is chronically anxious  worse with less lithium  No manic signs noted. Some wordfinding problems ongoing. Intrusive thoughts lately trigger SI      Lab Review:      Component Value Date/Time   NA 139 02/26/2021 1253   NA 142 03/20/2014 0514   K 4.0 02/26/2021 1253   K 4.1 03/20/2014 0514   CL 109 02/26/2021 1253   CL 112 (H) 03/20/2014 0514   CO2 22 02/26/2021 1253   CO2 26 03/20/2014 0514   GLUCOSE 83 02/26/2021 1253   GLUCOSE 99 03/20/2014 0514   BUN 13 02/26/2021 1253   BUN 7 03/20/2014 0514   CREATININE 1.56 (H) 02/26/2021 1253   CREATININE 0.78 03/20/2014 0514   CALCIUM 9.6 02/26/2021 1253   CALCIUM 9.5 03/20/2014 0514   PROT 7.8 08/11/2020 1750   PROT 7.3 03/18/2014 1459   ALBUMIN 4.7 08/11/2020 1750   ALBUMIN 3.2 (L) 03/18/2014 1459   AST 23 08/11/2020 1750   AST 16 03/18/2014 1459   ALT 16 08/11/2020 1750   ALT 16 03/18/2014 1459   ALKPHOS 34 (L) 08/11/2020 1750   ALKPHOS 105 03/18/2014 1459   BILITOT 0.5 08/11/2020 1750   BILITOT 0.3 03/18/2014 1459   GFRNONAA 51 (L) 02/26/2021 1253   GFRNONAA >60 03/20/2014 0514   GFRAA 54 (L) 04/18/2019 1612   GFRAA >60 03/20/2014 0514       Component Value Date/Time   WBC 6.2 02/26/2021 1253   RBC 4.17 (L) 02/26/2021 1253   HGB 13.3 02/26/2021 1253   HGB 12.6 (L) 03/20/2014 0514   HCT 39.4 02/26/2021 1253   HCT 38.4 (L) 03/20/2014 0514   PLT 431 (H) 02/26/2021 1253   PLT 346 03/20/2014 0514   MCV 94.5 02/26/2021 1253   MCV 89 03/20/2014 0514   MCH 31.9 02/26/2021 1253   MCHC 33.8 02/26/2021 1253   RDW 13.1 02/26/2021 1253   RDW 12.5 03/20/2014 0514   LYMPHSABS 3.2 03/20/2014 0514   MONOABS 1.0 03/20/2014 0514   EOSABS 0.4 03/20/2014 0514   BASOSABS 0.1 03/20/2014 0514    Lithium Lvl  Date Value Ref Range Status  08/12/2021 0.9 0.5 - 1.2 mmol/L Final    Comment:    A concentration of 0.5-0.8 mmol/L is advised for long-term use; concentrations of up to 1.2 mmol/L may be necessary during acute treatment.                                  Detection Limit = 0.1                           <0.1 indicates None Detected    08/12/21 lithium level 0.9 on 450 mg.  lithium  level Sept 0.8.   lithium level July 27, 2018 was normal at 1.0.   Lithium level [facility] October 03, 2018 = 1.2. Said he got lithium level at [facility] as requested.  Labs not in Epic.  Recent lipids ok except higher TG than usual.  Normal A1C.  No results found for: "PHENYTOIN", "PHENOBARB", "VALPROATE", "CBMZ"   .res Assessment: Plan:    Albert [name] was seen today for follow-up, depression and anxiety.  Diagnoses and all orders for this visit:  Severe bipolar I disorder, current or most recent episode depressed (HCC)  Mild cognitive impairment  Generalized anxiety disorder -     fluvoxaMINE (LUVOX) 50 MG tablet; Take 1 tablet (50 mg total) by mouth 2 (two) times  daily. -     PARoxetine (PAXIL) 20 MG tablet; 1-1/2 tablets daily for 5 days, then 1 tablet daily for 5 days, then 1/2 tablet daily for 5 days, then stop  Panic disorder with agoraphobia -     fluvoxaMINE (LUVOX) 50 MG tablet; Take 1 tablet (50 mg total) by mouth 2 (two) times daily. -     PARoxetine (PAXIL) 20 MG tablet; 1-1/2 tablets daily for 5 days, then 1 tablet daily for 5 days, then 1/2 tablet daily for 5 days, then stop  Insomnia due to mental condition  Lithium use  Forgetfulness    Chronic TR bipolar mixed and chronic anxiety.  He usually has mixed bipolar symptoms which we have not been able to completely eliminate.  See long list of meds tried.   He is somewhat chronically unstable but better on olanzapine worse if reduces quetiapine quickly.  Discussed mother's concerns about his memory which is primarily a short-term memory issue.  We discussed the alternative options of using Namenda off label for mild cognitive impairment.  We are not yet able to reduce the dose of lorazepam but that could potentially help as well.  His memory was much worse on Xanax than lorazepam. We discussed the short-term risks associated with benzodiazepines including sedation and increased fall risk among others.  Discussed long-term side  effect risk including dependence, potential withdrawal symptoms, and the potential eventual dose-related risk of dementia. Disc newer studies refute dementia risks.  Unfortunately due to the severity of set his symptoms polypharmacy is a necessity.  Disc unusual combo antipsychotics but it helps more than other options.  Discussed potential metabolic side effects associated with atypical antipsychotics, as well as potential risk for movement side effects. Advised pt to contact office if movement side effects occur.  He had some falling around the time when he previously took olanzapine but he took it for several months at this dosage before he had any side effect issues and he was on Xanax at the time that the following occurred. Take LED Seroquel to sleep.   No obvious alternatives for sleep  Lithium being used bc chronic SI and death thoughts.   Counseled patient regarding potential benefits, risks, and side effects of lithium to include potential risk of lithium affecting thyroid and renal function.  Discussed need for periodic lab monitoring to determine drug level and to assess for potential adverse effects.  Counseled patient regarding signs and symptoms of lithium toxicity and advised that they notify office immediately or seek urgent medical attention if experiencing these signs and symptoms.  Patient advised to contact office with any questions or concerns. Continue lithium  450 mg daily Lithium level 0.7 on 3/115/23 on 300 mg daily Call if death thoughts worsen or worsening SI.   Disc SE. Disc combo with Seroquel may lower response rate but he doesn't think he can sleep without Seroquel.  continue olanzapine to 30 mg nightly  for more racing thoughts and anxiety with less lithium DT severity of sx and difficulty getting off Seroquel conside rincrease.  Disc in detail again clozapine.  Disc risk in detail including low WBC with complication, myocarditis.  Extensive discussion of CBC  monitoring.  Disc this again.  He doesn't want to do this now bc has to make so many other med changes to work this.  Disc WD  and may need to go lower before he can stop it. continue Seroquel 200 mg .  Would like to see him taper off  if possible.  Consider fluvoxamine.  Yes this may help with obsessions on old GF and he failed other optioons. Start fluvoxamine 1/2 of 50 mg tablet and reduce paroxetine to 1 and 1/2 of 20 mg tablets for 5 days, Then increase fluvoxamine to 1 tablet daily and reduce paroxetine to 1 tablet daily for 5 days, Then increase fluvoxamine to 1-1/2 tablets daily and reduce paroxetine to 1/2 tablet daily for 5 days, Then increase fluvoxamine to 2 tablets daily and stop paroxetine   Did he take duloxetine?  Might help depression more  But for now continue lorazepam 0.5 mg QID, but use LED for cog reasons  Disc  off label use of DM for depression based on MOA of Auvelity and use of paroxetine to prolong half life of DM. Presence of paroxetine may have been reason he didn't tolerate Auvelity brief trial before.   He is more anxious but not clear if this is coincidental.   CBT dealing with obsession around old GF discussed.  We discussed the short-term risks associated with benzodiazepines including sedation and increased fall risk among others.  Discussed long-term side effect risk including dependence, potential withdrawal symptoms, and the potential eventual dose-related risk of dementia.  But recent studies from 2020 dispute this association between benzodiazepines and dementia risk. Newer studies in 2020 do not support an association with dementia.  Discussed safety plan at length with patient.  Advised patient to contact office with any worsening signs and symptoms.  Instructed patient to go to the [facility] emergency room for evaluation if experiencing any acute safety concerns, to include suicidal intent.  He commits to safety.  Disc Ozempic and Monjauro for  weight loss.  Has had weight gain from meds as a contributor. But lately has lost some weight.  Disc Dr. Ardelia Mems mentioned evidence it could help depression  Needs to keep up activity as much as possible for depression.  Take Miralax daily for constipation.  Could be SE Seroquel.  This appt was 30 mins.  FU 4 weeks   Lynder Parents, MD, DFAPA  Future Appointments  Date Time Provider [place]  06/07/2022  1:00 PM Cottle, Billey Co., MD [facility] None  07/06/2022  1:30 PM Cottle, Billey Co., MD [facility] None  12/30/2022  2:30 PM Billey Co, MD [facility] None    No orders of the defined types were placed in this encounter.      -------------------------------

## 2022-05-06 NOTE — Patient Instructions (Signed)
Start fluvoxamine 1/2 of 50 mg tablet and reduce paroxetine to 1 and 1/2 of 20 mg tablets for 5 days, Then increase fluvoxamine to 1 tablet daily and reduce paroxetine to 1 tablet daily for 5 days, Then increase fluvoxamine to 1-1/2 tablets daily and reduce paroxetine to 1/2 tablet daily for 5 days, Then increase fluvoxamine to 2 tablets daily and stop paroxetine

## 2022-05-07 ENCOUNTER — Other Ambulatory Visit: Payer: Self-pay | Admitting: Psychiatry

## 2022-05-07 DIAGNOSIS — F314 Bipolar disorder, current episode depressed, severe, without psychotic features: Secondary | ICD-10-CM

## 2022-05-28 ENCOUNTER — Other Ambulatory Visit: Payer: Self-pay | Admitting: Psychiatry

## 2022-05-31 ENCOUNTER — Other Ambulatory Visit: Payer: Self-pay | Admitting: Psychiatry

## 2022-05-31 DIAGNOSIS — F411 Generalized anxiety disorder: Secondary | ICD-10-CM

## 2022-05-31 DIAGNOSIS — F4001 Agoraphobia with panic disorder: Secondary | ICD-10-CM

## 2022-06-03 ENCOUNTER — Other Ambulatory Visit: Payer: Self-pay | Admitting: Psychiatry

## 2022-06-03 DIAGNOSIS — F411 Generalized anxiety disorder: Secondary | ICD-10-CM

## 2022-06-03 DIAGNOSIS — F4001 Agoraphobia with panic disorder: Secondary | ICD-10-CM

## 2022-06-07 ENCOUNTER — Encounter: Payer: Self-pay | Admitting: Psychiatry

## 2022-06-07 ENCOUNTER — Ambulatory Visit: Payer: Medicare PPO | Admitting: Psychiatry

## 2022-06-07 DIAGNOSIS — G3184 Mild cognitive impairment, so stated: Secondary | ICD-10-CM

## 2022-06-07 DIAGNOSIS — F4001 Agoraphobia with panic disorder: Secondary | ICD-10-CM

## 2022-06-07 DIAGNOSIS — F5105 Insomnia due to other mental disorder: Secondary | ICD-10-CM

## 2022-06-07 DIAGNOSIS — F314 Bipolar disorder, current episode depressed, severe, without psychotic features: Secondary | ICD-10-CM | POA: Diagnosis not present

## 2022-06-07 DIAGNOSIS — F411 Generalized anxiety disorder: Secondary | ICD-10-CM | POA: Diagnosis not present

## 2022-06-07 DIAGNOSIS — Z79899 Other long term (current) drug therapy: Secondary | ICD-10-CM

## 2022-06-07 MED ORDER — FLUVOXAMINE MALEATE 50 MG PO TABS: ORAL_TABLET | ORAL | 1 refills | Status: DC

## 2022-06-07 NOTE — Patient Instructions (Signed)
Wait 1-2 weeks then increase fluvoxamine to 50 mg in the AM and 100 mg in PM

## 2022-06-07 NOTE — Progress Notes (Signed)
LIEUTENANT ABARCA [id number] Mar 22, 1966 57 y.o.    Subjective:   Patient ID:  Albert [name] is a 57 y.o. (DOB Jul 16, 1965) male.  Chief Complaint:  Chief Complaint  Patient presents with   Follow-up    Severe bipolar I disorder, current or most recent episode depressed ([place])   Depression   Anxiety   Memory Loss    Depression        Associated symptoms include decreased concentration and suicidal ideas.  Past medical history includes anxiety.   Anxiety Symptoms include decreased concentration, dizziness, nervous/anxious behavior and suicidal ideas. Patient reports no chest pain, confusion, palpitations or shortness of breath.      Albert [name] presents to the office today for follow-up of mixed bipolar, anxiety and still grieving stress of breakup.  He requires frequent follow-up because of long-term symptoms.  At visit October 25, 2018.  We made several medicine changes because of his concerns about sleep and other issues.  We increased olanzapine back to 7.5 mg bc not sleeping as well at 5 mg.  He has to have the prescription for quetiapine written for up to 3 tablets at night in case insomnia is worse because insomnia makes his mood disorder so much worse.  We discussed that was above the usual max but the request was granted given his treatment resistant status. We also reduce lithium from 900 mg daily to 750 mg daily to try to reduce tremor and muscle twitches.  At  visit March 28, 2019.  Fluoxetine was increased to 40 mg daily.   Early January 2021 increased to 60 mg daily.  No SE.  seen June 28, 2019.  No further meds were changed except olanzapine was increased back to 10 mg daily to see if if he could get additional benefit per his request.  Covid vaccinated.  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  Then had cholecystectomy.  April 2021 appt with the following noted: 2 episodes night sweats lately.  Memory has been very bad lately.  Repeatedly asks mother questions. Still  tend to stay  in his room and his bed.  Still has rapid cycling mood swings.  Maybe some better with increase in olanzapine to 10 and tolerating itl.  Would like to get out more but can't DT Covid.  Can perform necessary chores.  Will get out of the house when he can.  No change in death thoughts and anxiety in intensity but is better with frequency.  Usually comes and goes in waves but more persistent.  Consistent with meds.  Ativan not helping anxiety very dramatically but he's not sure.  Fidgety.  No trigger other than still grieving relationship and can't get it out of his head.  Poor energy, concentration and more forgetful. In bed more and less active.  Sleep ok lately which is unusual.  Can concentrate on financial matters and stock market at times.  Tolerating meds.  Still some intrusive SI without reason. Less frequent obsessive thoughts about broken relationship and has been doing this for months.  Can't let it go.  Loop.   No unusual stress even with the family who is supportive. Likes the benefit that Zyprexa gives. No sleepwalking nor falling nor odd behavior.  No history of sleep walking.  He understands this may recur with an increase.  Also wants option to rarely take extra quetiapine 300 for sleep prn. Plan:  Try to reduce lorazepam if possible.  10/22/2019 appointment with the following noted: Continues  fluoxetine 60, lithium 600 mg, lorazepam 2 mg AM and HS and 1 mg midday, olanzapine 10, quetiapine 600 mg HS. Still anxious chronically and including driving in crowded spaces.  Doesn't thing Ativan helps as well as Xanax but less cognitive problems. Sleep is not as good.  Occ EFA.  More EMA and wanting to do things in the middle of the night but this is not typical.  Wonders about why that happens.  Some napping.   Tolerating meds.  Asks about weight loss meds.  Disc this in detail.   Plan no changes except OK meclizine prn vertigo.  02/11/20 appt with the following noted: Able to gradually reduce  lorazepam to 1 mg AM and HS. More depressed over time and less interested in things and less interest in going out but does with his parents. Has reduced from 800 to 600 mg HS with some awakening but usually able to go back to sleep.[name]  Spending a good amount of time in bed bc watches TV in bedroom.  Parents watch TV in different part of the house.  No mood swings he notices.   Concerns about weight gain about 200#. Likes olanzapine's benefit for sleep. Plan: Trial for TRD to  Increase fluoxetine to 80 mg daily  to use the combo with olanzapine for TR bipolar depression.   04/21/20 appt with following noted: I thought in beginning some benefit with fluoxetine.  Lifelong negative thinking continues.   Not much bipolar.  Reads a lot on bipolar.   Still tired and anxious a little more may be seasonal.  No familial stressors.  Tends to lay down in afternoon.  Anxiety not over anything in particular.   No SE with fluoxetine. Took meclizine prn.  Easily motion sick.    Asks questions about newer drugs for bipolar like Caplyta. Plan:  No med changes  06/30/20 appt noted: Tired of wearing masks.  Vaccinated.  Asked questions about when this will end with mask mandate.  Mood up and down some since here and getting up 2-3 times.  Disc awakening problems.  Trying to do better bc night eating some.   Mood is about average today so far.  Martin Majestic out with friends for breakfast.  Recognizes activity helps mood.   3 days in a row napped a lot in the afternoon.   Bed is a comfort place.  Gets bored and hard to motivate.  Tries to stay away from night eating and spending.   More depressed when alone and wonders about med changes. Plan: Failed response to olanzapine + fluoxetine for TR bipolar depression.   Reduce fluoxetine to 1 daily and reduce olanzapine to one half nightly for 10 days and then stop it. Then start [place] 1 daily  08/27/2020 appointment with the following noted: Patient decompensated with the  above switch to [place] with intrusive suicidal thoughts and had to be hospitalized for psychiatric reasons.  Multiple phone calls with family members since that time.  Spoke with the psychiatric nurse practitioner with the decision to restart olanzapine in place of the [place] given the patient was more stable while on the combination of Seroquel and olanzapine than with the [place]. Caplyta triggered HI/SI and depressed and confused on it, and agitated and still has some of it now. No akathisia. Frustrated with lack of therapy at the hospital.   Hydroxyzine didn't help jitteriness.  Feels some better.  Fleeting SI & HI but not obsessive like it was prior to hospitalization.  Feels  a little amped up and nervous.  Scared of what could happen.  Apprehensive being here talking about things. 5-6 hours sleep last night and would like to have more. At mom and dad's house right now and up more in the day. Inadvertently stopped lorazepam abruptly by mistake likely contributing to shakiness. Still some memory issues.  Trying to stay out of bed when watching TV.  Some awakening but managing. Occ fleeting SI and distracts himself.   Always spends a lot of time just laying around.   Can have anxiety for no reason like coming here, and varies in intensity without pattern.      Less panic than in the past.  Some chronic depression and hyperactivity and loudness and hyperverbal.  Usually back to sleep.  Total 6 hours but some napping, awakens 2-4 times nightly but back to sleep..  Taking quetiapine 600 mg HS.  still lacks interest and motivation.  Can follow a TV show if interested. Plan: Restart lorazepam 1 mg in the morning, 1 mg in the afternoon and 1 mg at night for anxiety Stop hydroxyzine  Increase olanzapine to 1 and 1/2 of 10 mg tablets in evening  09/05/2020 appt noted: seen with parents today at his request Made med changes noted and tolerated the changes Better than I did last week. Now only fleeting HI/SI  and "no where near what it was".  Sleeping better.  Still not a lot of energy.  Anhedonia.  Less agitation.  A lot of anxiety all the time but it's helping.  Would like more energy and motivation.   Doesn't handle stress well. Parents note he's less shakey.  He's still staying with his parents.  Only driven once since this happened and F wants him to be able to drive and function more independently.  M agrees he's better.  Memory is better but not normal.  Primarily STM problems No akathisia with olanzapine this time.    Administering his own meds but mo watched. Slept 8 hours last night. Plan consider further reductions in quetiapine  10/17/20 appt noted: Stayed on Seroquel 600 HS and olanzapine 15. Olanzapine seemed to do most for the sleep.  Also helping eliminated HI/SI for the most part.  Wants to try to increase it.  Depression is much better with less rumination also.  Fears akathisia at higher dose as in the past. Also on fluoxetine 40.   Plan: Reduce Seroquel to 1 and 1/2 tablets at night Increase olanzapine to 1 of the 15 mg tablet and 1/2 of the 5 mg tablet for 1 week,  Then, if tolerated increase the olanzapine to 20 mg or 1 of the 15 mg tablets and 1 of the 5 mg tablets. If possible then also reduce quetiapine to 1 tablet at night.  11/05/2020 appointment with the following noted: Up to olanzapine 20 mg hs for 3 days.  Reduced Serooquel to 450 mg HS.  Didn't try to go lower. Had hiatal hernia and umbilical hernia surgery recently with no problem.   No akathisia so far. Sleep ok with meds so far changes.  No mood changes yet but overall likes the smoothness of olanzapine and helping sleep without knociing him out. Still some occ HI/SI, he thinks bc of anxiety generally.  Easily anxious. Plan: Continue olanzapine 20 mg nightly longer to give it time to help mood sx reduce quetiapine to 1 tablet at night to minimize polypharmacy and reduce risk of akathisia.  01/07/21 appt noted: Able  to  reduce quetiapine 300 mg HS ok with decent sleep but still wakes a lot.  No AM hangover.  Don't have a lot to do.  Can enjoy going out and doing things.  But doesn't do it longterm.  Family is doing good.  Father started year 83 at his job.   Tolerating the meds well.  May wake more with quetiapine reduction. Lost 10#. Wonders if could try other meds he didn't tolerate before bc now can tolerate olanzapine and couldn't tolerate it in the past DT akathisia. Mood never great but can swing into depresssion without reason.   Plan: Continue olanzapine 20 mg nightly longer to give it time to help mood sx reduce quetiapine to 1/2 of the 300 mg  tablet at night to minimize polypharmacy and reduce risk of akathisia.  02/02/2021 appointment with the following noted: Tried reduced quetiapine to 150 mg and after a week had withdrawal sx including jittery and SI and he increased to 250 mg daily.  Felt better after increasing to 300 mg daily and last week reduced to 250 mg daily.  After increase quetiapine felt better in a few days.  Seems more alert with less quetiapine but can't tolerate a quick withdrawal.   Some awakening.  Plan: Reduce quetiapine by 50 mg every 2-4 weeks.  03/09/2021 phone call: Pt called and said that he is weaning off the seroquel. He was down to 200 mg of the seoquel and he started having withdrawal symptoms and sucidal thoughts. He went back up to 250 mg because he knew he was good on that.    Pt stated he starting back taking 250 mg on Saturday due to the symptoms he was having.Now he is no longer having suicidal thoughts but still very jittery. MD: He may just need to taper slower than most people.  If he can tolerate the 250 mg Seroquel with the jitteriness it should get better over the next 1 to 2 weeks.  Then he can try going down again by 50 mg at a time.  He probably needs to go down by just 50 mg every 2 to 4 weeks and wait till he fully adjusts to each reduction before he reduces  again.  03/17/21 appt noted: Hernia surgery 2 weeks ago. Seroquel 250 mg for a month then 200 mg daily and had SI and increased again to 300 mg daily.  Would love to get off it.  Withdrawal triggers intrusive HI/SI but otherwise doesn't have them No SE. Sleep is OK but not enough REM sleep.  Satisfied with other meds.   No current HI/SI.  No paranoia.  Still has anxiety and taking Ativan every 6 hours.  It works but doesn't cure it. Chronic anxiety and ask about ketamine for chronic depression. Plan: Because he had WD reducing from 250 to 200 mg daily will have to go slowly. Reduce quetiapine by 25 mg every 2-4 weeks. Reduce Seroquel to 250 mg for 2 weeks, Then reduce to 225 mg for 2 weeks, Then reduce to 200 mg for 2 weeks, Then reduce to 175 mg for 2 weeks, Then reduce to 150 mg Also: Buspirone 30 mg tablets for anxiety Start 1/3 tablet twice daily for 1 week Then increase to two thirds twice daily for 1 week Then increase to 1 tablet twice daily  04/15/2021 appointment with the following noted: Reduced Seroquel to 200 mg HS and increased buspirone to 30 mg BID STM px and wants to try to reduce Ativan to see  if it is better.  But fears with drawal sx. Parents complain about his memory. Less HI and SI since here. Less depressed. Plan: Buspirone 30 mg tablets for anxiety Start 1/3 tablet twice daily for 1 week Then increase to two thirds twice daily for 1 week Then increase to 1 tablet twice daily Continue olanzapine 20 mg nightly longer to give it time to help mood sx Because he had WD reducing from 250 to 200 mg daily will have to go slowly. Reduce quetiapine by 25 mg every 2-4 weeks. Reduce Seroquel to 250 mg for 2 weeks, Then reduce to 225 mg for 2 weeks, Then reduce to 200 mg for 2 weeks, Then reduce to 175 mg for 2 weeks, Then reduce to 150 mg Trial taper lorazepam: In hopes of improving short-term memory  04/30/2022 phone call: Complaining of withdrawal symptoms  reducing lorazepam from 0.5 mg 5 times a day to 0.5 mg 4 times daily.  05/21/2021 appointment with the following noted: Gotten onto buspar 30 BID and down on lorazepam to 0.5 mg QID. Gotten down to Seroquel 200 mg HS A little restless every now and then but able to reduce lorazepam for the last weeks. Is too inactive and lays in bed too much watching TV.   Can' t tell effect of buspirone bc of other changes.  Sleep with awakening with 6-7 hours. Fleeting SI without trigger or plan or intent. Depressed more than anxious. Enjoys watching stocks and investing. Plan: Trial taper lorazepam gradually over time In hopes of improving short-term memory But for now continue lorazepam 0.5 mg QID Start clonidine 0.1 mg tablets one half at night for 3 nights, then 1/2 tablet twice daily for 3 nights, then one half in the morning and 1 tablet at night If clonidine does not noticeably help anxiety call the office  05/27/2021 phone call initially complaining of homicidal and suicidal thoughts after taking 1 mg of clonidine.  He was told to stop the medication.  But then called back stating he was having homicidal and suicidal thoughts from withdrawal from the medication which was not logical.  He agreed it was not logical and stated on 05/29/2021 that he felt better  06/23/2021 appointment the following noted: No clonidine. Sunday panic attack eating out and then having SI.  Stayed with father to feel safer. Intrusive thoughts of hurting parents or others.  HI only when has SI.  Usually panic without SI.  Then had intrusive thoughts about hurting others.   Usually intrusive thoughts are brief and fleeting.    07/20/21  appt noted; More anxious with less lithium.  More anxious in his body.  Not more depressed or irritable.  Sleep is the same.  More racing thoughts.  Chronic SI probably a litte worse but not unmanageable. Rduced lithium last week to 300 mg daily. No recent change in lisinopril. Off the  clonidine for a week or so. Doesn't drink much water.  Drinks a lot of diet coke. No marked akathisia or RLS but does shake his leg DT anxiety. Worries about needing mor emedicine. Plan: Increase olanzapine to 25 mg nightly bc more racing thoughts and anxiety with less lithium DT severity of sx and difficulty getting off Seroquel conside rincrease.  07/30/21 TC : CO racing SI today and wonders about whether increased sx racing thoughts, hyperactivity, SI related to Reduced lithium from 600 mg daily to 300 mg daily recently DT lithium level 1.8.  could be related.    Plan: increase  olanzapine 30 mg pm Today take lithium 600 mg now.  Tomorrow start lithium CR 450 mg daily Continue olanzapine 30 mg PM until SI resolves unless akathisia returns.  Albert Staggers, MD, DFAPA  08/17/21 appt noted:  No akathisia with olanzapine 30 mg pm (about 3 weeks) and is sleeping better. Increased lithium to 450 mg daily. Tolerating meds. Still racing negative thoughts maybe some better.  Asked about it. SI come and go.   Anxiety about the same as depression.  No motivation unless has to do something. Plan: Reduce  fluoxetine 20 mg 1 daily in hopes racing thoughts and anxiety are better.  09/14/2021 appointment with the following noted: No difference noted with reduction fluoxetine 20 mg daily. Most of the time negative thoughts, ex "what if I get in a wreck?"  Frequent negative thoughts.   Still on lorazepam 0.5 mg TID.   Wonders about trying prior meds that caused akathisia bc no longer gets it with olanzapine 30 mg daily. Sleep is usually ok.  Taking Seroquel 200 mg HS.  Thinks the olanzapine helps his sleep and doesn't want to stop it. Panic 1-2 times per month.  Then takes extra lorazepam. Plan: DC fluoxetine 20 mg 1 daily  Trial Auvelity off label for depression 1 in the AM for 1 week then 1 in AM and 1 with evening meal  10/15/2021 appointment with the following noted: Occ of getting confused as to day  and went to church on a Thursday my mistake.   Had it happen a few months ago too.  Memory is terrible. Thinks he couldn't tolerate more Auvelity. Not a lot of benefit from [place] 1 daily. Still quite a bit of anxiety.   A little drowsy  and sleeps 7-8 hours at night. Word finding problems.  Lost wallet. Plan: DC  Auvelity  For [facility]: memantine 1/2 tablet in the AM for 1 week, then 1/2 tablet twice daily, then 1/2 tablet in the AM and 1 tablet at night for 1 week then 1 tablet twice daily  10/22/2021 phone call complaining of additional stress asking to start new medication because of anxiety and more suicidal thoughts without intent or plan.  Reports taking more than 4 prescribed Ativan a day recently.  Asking to increase olanzapine which is already at 30 mg daily. MD response:We cannot go any higher in the dosage of olanzapine that he is currently taking.  A lot of these suicidal thoughts are apparently anxiety driven.  Let us have him resume taking paroxetine which is one of the more effective medicines for anxiety.  I will send it in and the instructions will be paroxetine 20 mg tablets 1/2 tablet daily for 1 week then 1 tablet daily  11/16/21 appt noted: Multiple phone calls since she was here.  Complaining of suicidal thoughts from memantine which was stopped. Started paroxetine and up to 20 mg daily.  Continues olanzapine 30 mg, lithium 450 mg nightly, lorazepam 0.5 mg 4 times daily , And quetiapine 200 mg nightly. Questions about whether memantine caused the neg SI really bc has them at times. When has SI often anxiety driven.  Will tend to ruminate on past relationship ended.   Edgy and irritable all the time but memantine seemed to make it worse. Mind is getting better with less SI lately.   No SE with paroxetine.   Aggrivated by forgetfulness.  eXample, talked with someone about going to breakfast last night and then forgot to go this mornin.g. Plan prescribed  donepezil 5 mg daily for  cognitive complaints specifically forgetfulness  12/31/2021 appointment with the following noted: He had been prescribed donepezil 5 mg for cognitive complaints.  He called back reporting he felt it was causing homicidal thoughts and was instructed to stop it.  He has had these types of thoughts in the past.  He had no desire to act on them. Discouraged. Ongoing depression and reduced enjoyment and negative thoughts.  Can enjoy some things. A lot of anxiety chronically.  Trying to limit Ativan to 5 of 0.5 mg daily.  Sometimes needs 2. Constipation for years.  02/03/22 appt noted: Depression makes LBP worse. Ongoing chronic depression. Tolerated rivastigmine without triggering SI Chronic anxiety and Ativan helps some. Occ panic but not usual.  Asks if any other meds coud be used. Plan: But for now continue lorazepam 0.5 mg QID, but use LED for cog reasons Increase rivastigmine to 3 mg capsule 1 twice daily or 2 of the 1.5 mg capsules twice daily.  Call if you have problems with nausea Start dextromethorphan 1 capsule twice daily . Thi sis off label for depression based on MOA of Auvelity and use of paroxetine to prolong half life of DM. Presence of paroxetine may have been reason he didn't tolerate Auvelity brief trial before.  Will proceed slowly.  03/17/2022 appointment noted: Current psych meds: Rivastigmine 3 mg twice daily, quetiapine 200 mg nightly, paroxetine 40 mg daily, olanzapine 30 mg nightly, Namenda 10 mg daily, lorazepam 0.5 mg 4 times daily, lithium CR 450 mg nightly, dextromethorphan 15 mg twice daily A lot of anxiety since here.  Some days taken more lorazepam to 6-7 tablets daily.  No major triggers but anxious even about coming here and other normal things.  Cancelled trip to collect rocks with friend and cancelled the trip bc anxious.  Usually if can go he is ok.  Lots of SI ongoing Memory might be a little better. Asks how he responded to [employer].  Will check paper  chart.  03/24/22 TC:  CO SI worse and restilless with Rivastigmine 9 mg . MD response:  Stop the rivastigmine since the [place] appears to be having side effects from it.  He was having anxiety at the last appointment 2 and then we increased the dose.  This might be causing the anxiety to be worse. He has chronic suicidal thoughts without intent or plan.  However it appears the thoughts are more intense with the rivastigmine     05/06/21 appt noted: Stopped rivastigmine DT anxiety and worsening SI.  Was better the next day. This week had some SI.  Thinking about the prior relationship tends to lead to SI. Will take Ativan when gets SI and it helps a little at 0.5 mg at a time. Plan: Consider fluvoxamine.  Yes this may help with obsessions on old GF and he failed other optioons. Start fluvoxamine 1/2 of 50 mg tablet and reduce paroxetine to 1 and 1/2 of 20 mg tablets for 5 days, Then increase fluvoxamine to 1 tablet daily and reduce paroxetine to 1 tablet daily for 5 days, Then increase fluvoxamine to 1-1/2 tablets daily and reduce paroxetine to 1/2 tablet daily for 5 days, Then increase fluvoxamine to 2 tablets daily and stop paroxetine   06/07/22 appt noted: Feels like the fluvoxamine 50 BID is too much.  On this a couple of weeks. Worries he might be worse with this with regard to SI.    He thinks it's worse with increase.  New neighbor who has a young boy. He worries that Reginal Lutes is triggering depression and intrusive SI/HI a couple of days later and lasts a little while. Has had anxiety turning into panic attacks.  This gets associated with depression and SI/HI.  These thoughts are intrusive and he doesn't want to act on them.  Can have HI even towards family with whom he has no negative emotions.  Little things like a bump up in parking lot sets off his depression and anxiety and doesn't seem to handle things.   No sig akathisia.  In the past it's been continuous.   Awakens every 2-3 hours.   Usually goes back to sleep with meds. Has not had alcohol in 20 years and had some intrusive thoughts without drinking.     Psych med hx extensive including ECT and  risperidone, Zyprexa 30 Latuda 80 which caused akathisia,  Vraylar, Rexulti, aripiprazole 20 mg with akathisia,  Seroquel 1000 mg,  InVega, Geodon, Saphris with side effects, symbyax, Fanapt NR .  Caplyta SE and markedly worse. lithium 1200 SE,  lamotrigine 300 mg, Depakote 2000 mg, Tegretol, Trileptal and several of these in combinations, gabapentin,  N-acetylcysteine, Nuedexta,     Belsomra with no response,  Lunesta no response, trazodone 200 mg,  Xanax, clonazepam, lorazepam less sedation. Buspirone NR Clonidine SE with 2 trials  Viibryd 40 mg for 3 months with diarrhea, protriptyline with side effects,  Trintellix 20 mg,  Parnate 50 mg with no response,   imipramine, venlafaxine,  Emsam 12 mg for 2 months,   bupropion was side effects,  Lexapro 20 mg, sertraline, paroxetine, Deplin, fluoxetine 80 Auvelity NR at one daily.  SE BID  methylphenidate 60 mg,  Vyvanse, Concerta, strattera, , modafinil,  Memantine worsening SI? Donepezil, complained of HI, Exelon 3 BID  pramipexole,  amantadine ,  Patient prone to akathisia.   Review of Systems:  Review of Systems  Respiratory:  Negative for shortness of breath.   Cardiovascular:  Negative for chest pain and palpitations.  Musculoskeletal:  Positive for back pain.  Neurological:  Positive for dizziness and tremors.       Fidgety  Psychiatric/Behavioral:  Positive for decreased concentration and suicidal ideas. Negative for agitation, behavioral problems, confusion, dysphoric mood, hallucinations, self-injury and sleep disturbance. The patient is nervous/anxious. The patient is not hyperactive.     Medications: I have reviewed the patient's current medications.  Current Outpatient Medications  Medication Sig Dispense Refill   acetaminophen (TYLENOL) 500 MG  tablet Take 1,000 mg by mouth every 6 (six) hours as needed for moderate pain.     bisacodyl (DULCOLAX) 10 MG suppository Place 1 suppository (10 mg total) rectally as needed for moderate constipation. 12 suppository 0   docusate sodium (COLACE) 100 MG capsule Take 1 capsule (100 mg total) by mouth 2 (two) times daily. 60 capsule 2   fenofibrate (TRICOR) 145 MG tablet Take 145 mg by mouth daily.     fluticasone (FLONASE) 50 MCG/ACT nasal spray Place 1 spray into both nostrils daily as needed for allergies or rhinitis.     hydrocortisone (ANUSOL-HC) 2.5 % rectal cream Place 1 application rectally 3 (three) times daily as needed for hemorrhoids.     levothyroxine (SYNTHROID, LEVOTHROID) 75 MCG tablet Take 75 mcg by mouth daily before breakfast.     lisinopril (ZESTRIL) 5 MG tablet Take 5 mg by mouth daily.     lithium carbonate (ESKALITH) 450 MG ER tablet TAKE ONE TABLET BY MOUTH AT BEDTIME  90 tablet 0   LORazepam (ATIVAN) 0.5 MG tablet TAKE ONE TABLET BY MOUTH FOUR TIMES DAILY 120 tablet 1   meclizine (ANTIVERT) 25 MG tablet Take 1 tablet (25 mg total) by mouth 3 (three) times daily as needed for dizziness. 30 tablet 1   OLANZapine (ZYPREXA) 10 MG tablet TAKE 1 TABLET BY MOUTH AT BEDTIME 90 tablet 0   OLANZapine (ZYPREXA) 20 MG tablet Take 1 tablet (20 mg total) by mouth at bedtime. 90 tablet 1   polycarbophil (FIBERCON) 625 MG tablet Take 1,250 mg by mouth daily.     QUEtiapine (SEROQUEL) 200 MG tablet TAKE ONE TABLET BY MOUTH AT BEDTIME 90 tablet 0   sildenafil (VIAGRA) 100 MG tablet Take 1 tablet (100 mg total) by mouth daily as needed for erectile dysfunction. 30 tablet 6   tamsulosin (FLOMAX) 0.4 MG CAPS capsule Take 1 capsule (0.4 mg total) by mouth daily. 90 capsule 3   fluvoxaMINE (LUVOX) 50 MG tablet 1 in the AM and 2 tablets in PM 90 tablet 1   memantine (NAMENDA) 10 MG tablet Take by mouth. (Patient not taking: Reported on 06/07/2022)     No current facility-administered medications for  this visit.   Medication Side Effects: Other: mild sleepiness.   Occ twitches.\, tremor  Allergies:  Allergies  Allergen Reactions   Meloxicam Other (See Comments)    Other reaction(s): Dizziness   Nsaids     Other reaction(s): Hallucination, Other (See Comments)   Ibuprofen     Can not take because taking lithium   Prednisone     Can't sleep    Wellbutrin [Bupropion]     Suicide thoughts    Past Medical History:  Diagnosis Date   ADHD (attention deficit hyperactivity disorder)    Anemia    Anxiety    Bipolar disorder (HCC)    BPH (benign prostatic hyperplasia)    Chronic kidney disease, stage 3b (HCC)    DDD (degenerative disc disease), cervical    Depression    Deviated septum    ED (erectile dysfunction)    GERD (gastroesophageal reflux disease)    Graves disease    History of hiatal hernia    Hypertension    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    Hypothyroidism    Pneumonia    PONV (postoperative nausea and vomiting)     Family History  Problem Relation Age of Onset   Prostate cancer Neg Hx    Bladder Cancer Neg Hx    Kidney cancer Neg Hx     Social History   Socioeconomic History   Marital status: Single    Spouse name: Not on file   Number of children: Not on file   Years of education: Not on file   Highest education level: Not on file  Occupational History   Not on file  Tobacco Use   Smoking status: Former    Types: Cigarettes    Passive exposure: Past   Smokeless tobacco: Former    Types: Associate Professor Use: Never used  Substance and Sexual Activity   Alcohol use: Not Currently   Drug use: Never   Sexual activity: Yes  Other Topics Concern   Not on file  Social History Narrative   Not on file   Social Determinants of Health   Financial Resource Strain: Not on file  Food Insecurity: Not on file  Transportation Needs: Not on file  Physical Activity: Not on  file  Stress: Not on file  Social  Connections: Not on file  Intimate Partner Violence: Not on file    Past Medical History, Surgical history, Social history, and Family history were reviewed and updated as appropriate.   Please see review of systems for further details on the patient's review from today.   Objective:   Physical Exam:  There were no vitals taken for this visit.  Physical Exam Constitutional:      General: He is not in acute distress.    Appearance: He is well-developed.  Musculoskeletal:        General: No deformity.  Neurological:     Mental Status: He is alert and oriented to person, place, and time.     Cranial Nerves: No dysarthria.     Motor: Tremor present.     Coordination: Coordination normal.     Comments: mild tremor  Psychiatric:        Attention and Perception: Attention and perception normal. He does not perceive auditory or visual hallucinations.        Mood and Affect: Mood is anxious and depressed. Affect is not labile, blunt, angry, tearful or inappropriate.        Speech: Speech normal. Speech is not slurred.        Behavior: Behavior normal. Behavior is not slowed. Behavior is cooperative.        Thought Content: Thought content is not delusional. Thought content includes suicidal ideation. Thought content does not include homicidal ideation. Thought content does not include suicidal plan.        Cognition and Memory: Cognition normal. He exhibits impaired recent memory.        Judgment: Judgment normal.     Comments: Insight fair Chronically talkative more today He is chronically anxious  worse with less lithium No manic signs noted. Some wordfinding problems ongoing. Intrusive thoughts lately trigger SI Fidgety worse at times      Lab Review:     Component Value Date/Time   NA 139 02/26/2021 1253   NA 142 03/20/2014 0514   K 4.0 02/26/2021 1253   K 4.1 03/20/2014 0514   CL 109 02/26/2021 1253   CL 112 (H) 03/20/2014 0514   CO2 22 02/26/2021 1253   CO2 26  03/20/2014 0514   GLUCOSE 83 02/26/2021 1253   GLUCOSE 99 03/20/2014 0514   BUN 13 02/26/2021 1253   BUN 7 03/20/2014 0514   CREATININE 1.56 (H) 02/26/2021 1253   CREATININE 0.78 03/20/2014 0514   CALCIUM 9.6 02/26/2021 1253   CALCIUM 9.5 03/20/2014 0514   PROT 7.8 08/11/2020 1750   PROT 7.3 03/18/2014 1459   ALBUMIN 4.7 08/11/2020 1750   ALBUMIN 3.2 (L) 03/18/2014 1459   AST 23 08/11/2020 1750   AST 16 03/18/2014 1459   ALT 16 08/11/2020 1750   ALT 16 03/18/2014 1459   ALKPHOS 34 (L) 08/11/2020 1750   ALKPHOS 105 03/18/2014 1459   BILITOT 0.5 08/11/2020 1750   BILITOT 0.3 03/18/2014 1459   GFRNONAA 51 (L) 02/26/2021 1253   GFRNONAA >60 03/20/2014 0514   GFRAA 54 (L) 04/18/2019 1612   GFRAA >60 03/20/2014 0514       Component Value Date/Time   WBC 6.2 02/26/2021 1253   RBC 4.17 (L) 02/26/2021 1253   HGB 13.3 02/26/2021 1253   HGB 12.6 (L) 03/20/2014 0514   HCT 39.4 02/26/2021 1253   HCT 38.4 (L) 03/20/2014 0514   PLT 431 (H) 02/26/2021 1253   PLT  346 03/20/2014 0514   MCV 94.5 02/26/2021 1253   MCV 89 03/20/2014 0514   MCH 31.9 02/26/2021 1253   MCHC 33.8 02/26/2021 1253   RDW 13.1 02/26/2021 1253   RDW 12.5 03/20/2014 0514   LYMPHSABS 3.2 03/20/2014 0514   MONOABS 1.0 03/20/2014 0514   EOSABS 0.4 03/20/2014 0514   BASOSABS 0.1 03/20/2014 0514    Lithium Lvl  Date Value Ref Range Status  08/12/2021 0.9 0.5 - 1.2 mmol/L Final    Comment:    A concentration of 0.5-0.8 mmol/L is advised for long-term use; concentrations of up to 1.2 mmol/L may be necessary during acute treatment.                                  Detection Limit = 0.1                           <0.1 indicates None Detected    08/12/21 lithium level 0.9 on 450 mg.  lithium level Sept 0.8.   lithium level July 27, 2018 was normal at 1.0.   Lithium level [facility] October 03, 2018 = 1.2. Said he got lithium level at [facility] as requested.  Labs not in Epic.  Recent lipids ok except higher TG than  usual.  Normal A1C.  No results found for: "PHENYTOIN", "PHENOBARB", "VALPROATE", "CBMZ"   .res Assessment: Plan:    Albert [name] was seen today for follow-up, depression, anxiety and memory loss.  Diagnoses and all orders for this visit:  Severe bipolar I disorder, current or most recent episode depressed (HCC)  Generalized anxiety disorder -     fluvoxaMINE (LUVOX) 50 MG tablet; 1 in the AM and 2 tablets in PM  Panic disorder with agoraphobia -     fluvoxaMINE (LUVOX) 50 MG tablet; 1 in the AM and 2 tablets in PM  Mild cognitive impairment  Insomnia due to mental condition  Lithium use    Chronic TR bipolar mixed and chronic anxiety.  He usually has mixed bipolar symptoms which we have not been able to completely eliminate.  See long list of meds tried.   He is somewhat chronically unstable but better on olanzapine worse if reduces quetiapine quickly.  Discussed mother's concerns about his memory which is primarily a short-term memory issue.  We discussed the alternative options of using Namenda off label for mild cognitive impairment.  We are not yet able to reduce the dose of lorazepam but that could potentially help as well.  His memory was much worse on Xanax than lorazepam. We discussed the short-term risks associated with benzodiazepines including sedation and increased fall risk among others.  Discussed long-term side effect risk including dependence, potential withdrawal symptoms, and the potential eventual dose-related risk of dementia. Disc newer studies refute dementia risks.  Unfortunately due to the severity of set his symptoms polypharmacy is a necessity.  Disc unusual combo antipsychotics but it helps more than other options.  Discussed potential metabolic side effects associated with atypical antipsychotics, as well as potential risk for movement side effects. Advised pt to contact office if movement side effects occur.  He had some falling around the time when he previously  took olanzapine but he took it for several months at this dosage before he had any side effect issues and he was on Xanax at the time that the following occurred. Take LED Seroquel to sleep.  No obvious alternatives for sleep  Lithium being used bc chronic SI and death thoughts.   Counseled patient regarding potential benefits, risks, and side effects of lithium to include potential risk of lithium affecting thyroid and renal function.  Discussed need for periodic lab monitoring to determine drug level and to assess for potential adverse effects.  Counseled patient regarding signs and symptoms of lithium toxicity and advised that they notify office immediately or seek urgent medical attention if experiencing these signs and symptoms.  Patient advised to contact office with any questions or concerns. Continue lithium  450 mg daily Lithium level 0.7 on 3/115/23 on 300 mg daily Call if death thoughts worsen or worsening SI.   Disc SE. Disc combo with Seroquel may lower response rate but he doesn't think he can sleep without Seroquel.  continue olanzapine to 30 mg nightly  for more racing thoughts and anxiety with less lithium DT severity of sx and difficulty getting off Seroquel conside rincrease.  Disc in detail again clozapine.  Disc risk in detail including low WBC with complication, myocarditis.  Extensive discussion of CBC monitoring.  Disc this again.  He doesn't want to do this now bc has to make so many other med changes to work this. Disc this again bc taking Wegovy might help him to tolerate it better.  Disc WD  and may need to go lower before he can stop it. continue Seroquel 200 mg .  Would like to see him taper off if possible.  Consider fluvoxamine.  Yes this may help with obsessions on old GF and he failed other optioons.  It's not working but he went from high normal paroxetine to low normal fluvoxamine.  Likely will neeed increase. As soon as he feels comfortable increase  fluvoxamine to 50 mg AM and 100 mg PM for intrusive thoughts of HI/SI and obs on GF  Did he take duloxetine?  Might help depression more  But for now continue lorazepam 0.5 mg QID, but use LED for cog reasons  For [facility]CI had to stop rivastigmine DT worsening SI  CBT dealing with obsession around old GF discussed.  We discussed the short-term risks associated with benzodiazepines including sedation and increased fall risk among others.  Discussed long-term side effect risk including dependence, potential withdrawal symptoms, and the potential eventual dose-related risk of dementia.  But recent studies from 2020 dispute this association between benzodiazepines and dementia risk. Newer studies in 2020 do not support an association with dementia.  Discussed safety plan at length with patient.  Advised patient to contact office with any worsening signs and symptoms.  [facility] if experiencing any acute safety concerns, to include suicidal intent.  He commits to safety.  Disc Ozempic and Monjauro for weight loss.  Has had weight gain from meds as a contributor. But lately has lost some weight.  Disc Dr. Pollie MeyerMcIntyre mentioned evidence it could help depression.  I rec he continue this also.  Unlikely to worsen SI  Needs to keep up activity as much as possible for depression.  Take Miralax daily for constipation.  Could be SE Seroquel.  This appt was 30 mins.  FU 4 weeks   Albert Staggersarey Cottle, MD, DFAPA  Future Appointments  Date Time Provider Department Center  07/06/2022  1:30 PM [name], Steva Readyarey G Jr., MD [facility] None  08/05/2022  1:30 PM [name], Steva Readyarey G Jr., MD [facility] None  12/30/2022  2:30 PM Legrand RamsSninsky, Brian  C, MD [facility] None    No orders of the defined types were placed in this encounter.      -------------------------------

## 2022-06-18 ENCOUNTER — Other Ambulatory Visit: Payer: Self-pay | Admitting: Psychiatry

## 2022-06-18 DIAGNOSIS — F4001 Agoraphobia with panic disorder: Secondary | ICD-10-CM

## 2022-06-18 DIAGNOSIS — F314 Bipolar disorder, current episode depressed, severe, without psychotic features: Secondary | ICD-10-CM

## 2022-06-18 DIAGNOSIS — F411 Generalized anxiety disorder: Secondary | ICD-10-CM

## 2022-06-26 IMAGING — RF DG ESOPHAGUS
10 of 14 series · 14 of 21 positions shown · non-contrast
Comparison: CT chest 06/27/2020.

CLINICAL DATA: Hiatal hernia.

EXAM:
ESOPHOGRAM / BARIUM SWALLOW / BARIUM TABLET STUDY
TECHNIQUE: Combined double contrast and single contrast examination performed
using effervescent crystals, thick barium liquid, and thin barium
liquid. The patient was observed with fluoroscopy swallowing a 13 mm
barium sulphate tablet.
FLUOROSCOPY TIME:  Fluoroscopy Time:  2 minutes 0 seconds
Radiation Exposure Index (if provided by the fluoroscopic device):
58.4 mGy

[Series 1: fluoro_barium 2fps_bw · 0.17mm/px · 2 of 7 frames shown (1 of 10)]
[frame 2/7]
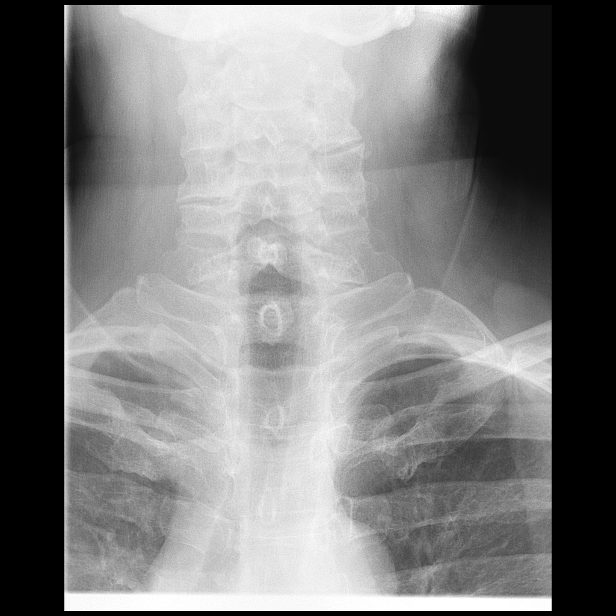
[frame 6/7]
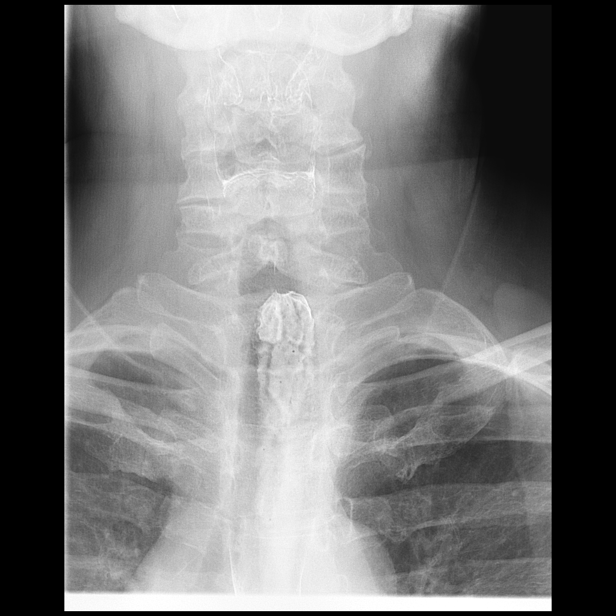

[Series 2: fluoro_barium 2fps_bw · 0.17mm/px · 2 of 7 frames shown (2 of 10)]
[frame 2/7]
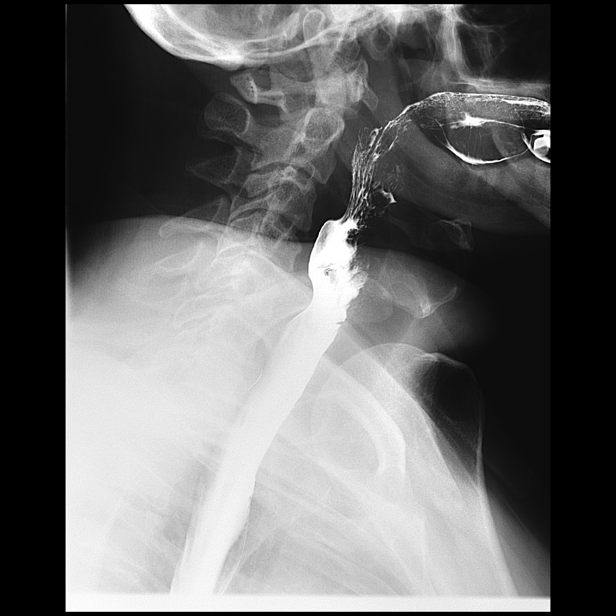
[frame 6/7]
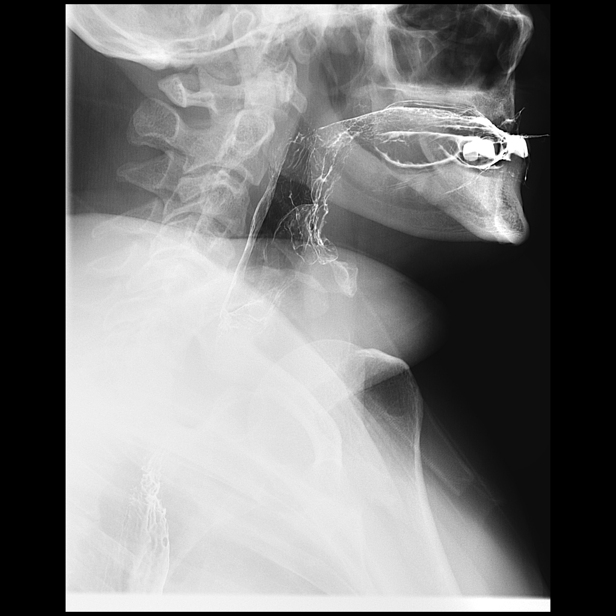

[Series 3: fluoro_barium 2fps_bw · 0.17mm/px · 1 of 1 slices shown (3 of 10)]
[im 1/1]
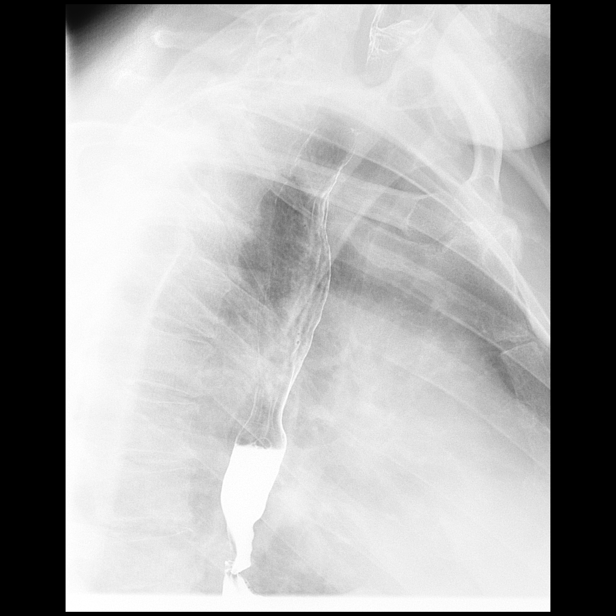

[Series 5: fluoro_barium 2fps_bw · 0.17mm/px · 2 of 2 frames shown (4 of 10)]
[frame 1/2]
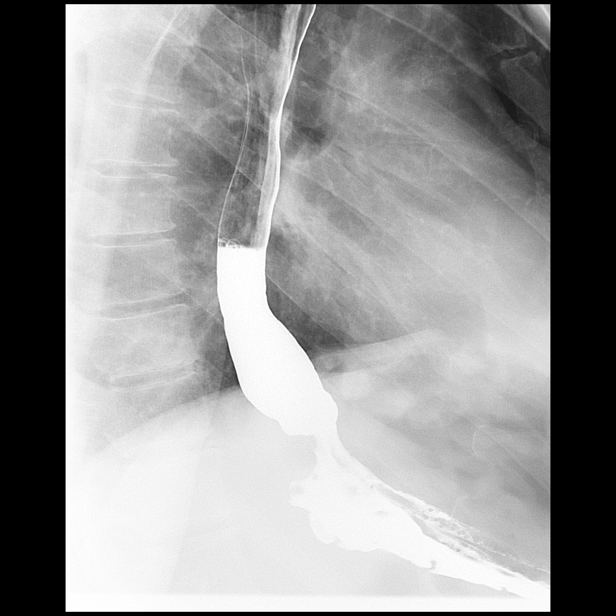
[frame 2/2]
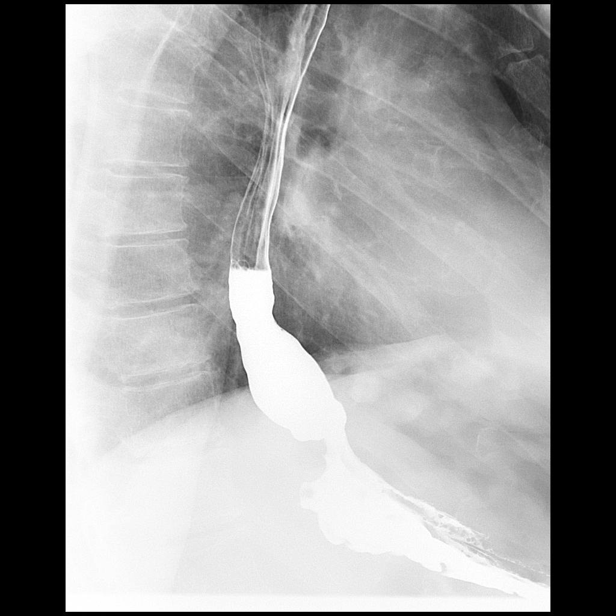

[Series 7: fluoro_barium 2fps_bw · 0.18mm/px · 2 of 2 frames shown (5 of 10)]
[frame 1/2]
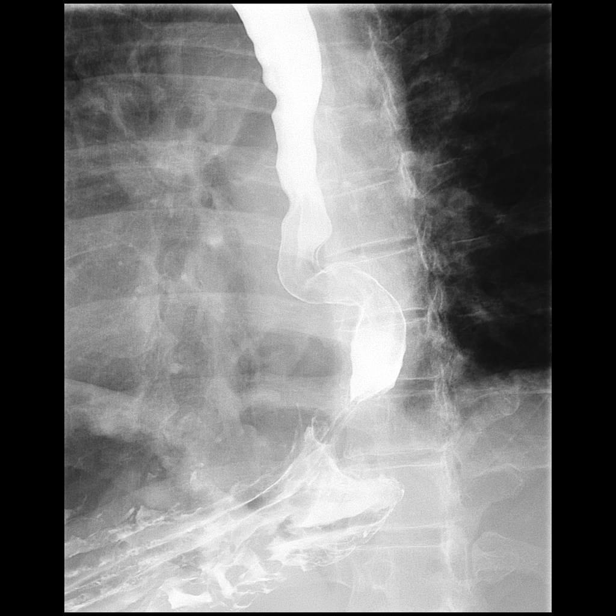
[frame 2/2]
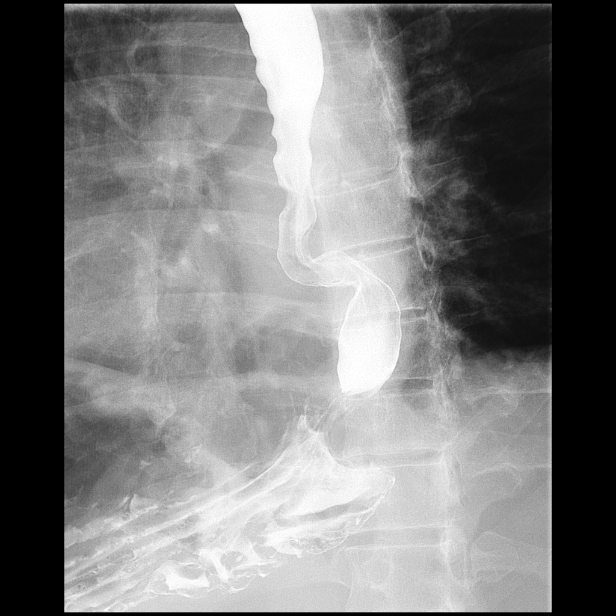

[Series 9: fluoro_barium 2fps_bw · 0.18mm/px · 1 of 1 slices shown (6 of 10)]
[im 1/1]
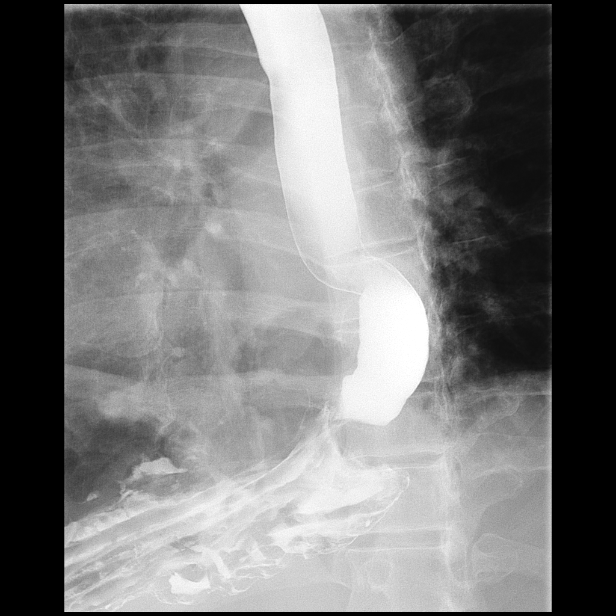

[Series 10: fluoro_barium 2fps_bw · 0.18mm/px · 1 of 2 frames shown (7 of 10)]
[frame 1/2]
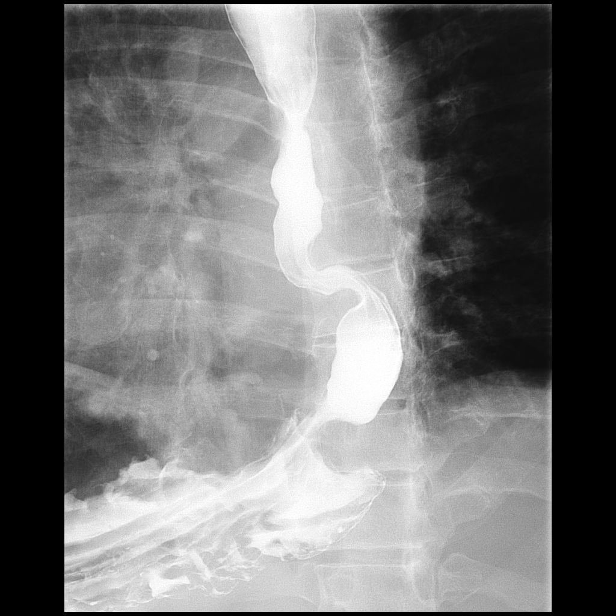

[Series 11: fluoro_barium 2fps_bw · 0.18mm/px · 1 of 1 slices shown (8 of 10)]
[im 1/1]
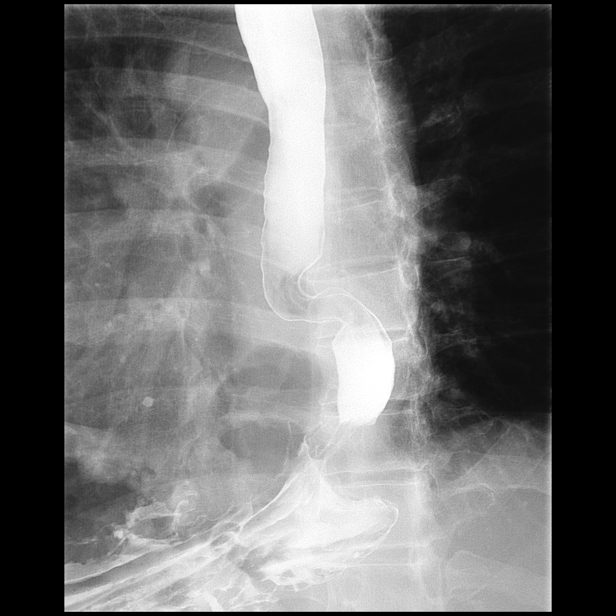

[Series 12: fluoro_barium 2fps_bw · 0.18mm/px · 1 of 1 slices shown (9 of 10)]
[im 1/1]
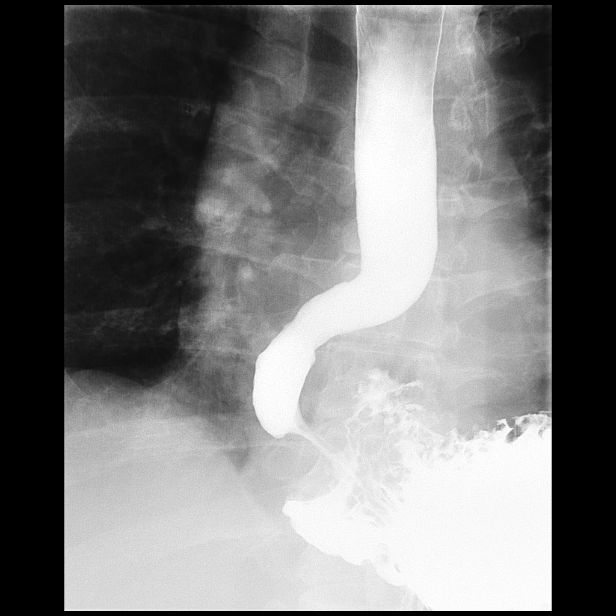

[Series 14: fluoro_barium 2fps_bw · 0.18mm/px · 1 of 1 slices shown (10 of 10)]
[im 1/1]
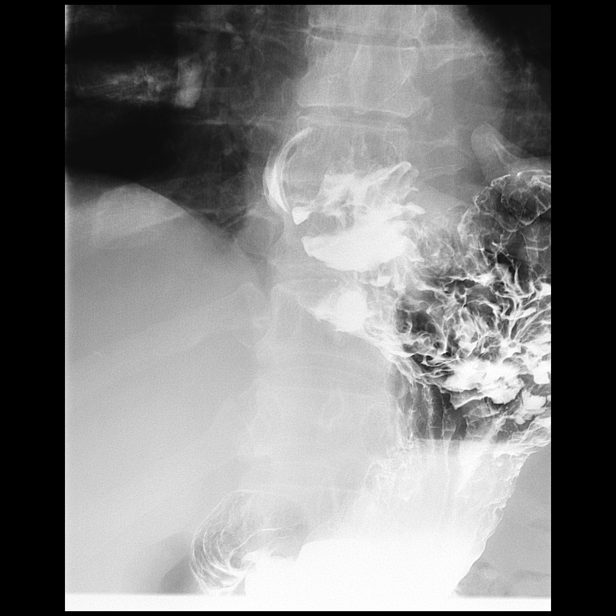

[14 of 21 positions shown; findings below may reference images not displayed]

FINDINGS: Cervical esophagus appears normal. No aspiration. Focal moderate
narrowing of the distal esophagus at the gastroesophageal junction
is present. Associated small hiatal hernia is present. No prominent
reflux. Standard barium tablet passes normally.
IMPRESSION: 1. Focal moderate narrowing of the distal esophagus at the
gastroesophageal junction. Endoscopic evaluation is suggested.

2. Associated small hiatal hernia is present. No prominent reflux.
Standard barium tablet passes normally.

## 2022-07-06 ENCOUNTER — Ambulatory Visit (INDEPENDENT_AMBULATORY_CARE_PROVIDER_SITE_OTHER): Payer: Medicare PPO | Admitting: Psychiatry

## 2022-07-06 ENCOUNTER — Encounter: Payer: Self-pay | Admitting: Psychiatry

## 2022-07-06 DIAGNOSIS — G3184 Mild cognitive impairment, so stated: Secondary | ICD-10-CM

## 2022-07-06 DIAGNOSIS — F5105 Insomnia due to other mental disorder: Secondary | ICD-10-CM

## 2022-07-06 DIAGNOSIS — F314 Bipolar disorder, current episode depressed, severe, without psychotic features: Secondary | ICD-10-CM

## 2022-07-06 DIAGNOSIS — F411 Generalized anxiety disorder: Secondary | ICD-10-CM

## 2022-07-06 DIAGNOSIS — F4001 Agoraphobia with panic disorder: Secondary | ICD-10-CM | POA: Diagnosis not present

## 2022-07-06 DIAGNOSIS — Z79899 Other long term (current) drug therapy: Secondary | ICD-10-CM

## 2022-07-06 NOTE — Progress Notes (Signed)
Albert [name] [id number] [birth year] 57 y.o.    Subjective:   Patient ID:  Albert [name] is a 57 y.o. (DOB [birth year]) male.  Chief Complaint:  Chief Complaint  Patient presents with   Follow-up   Depression   Manic Behavior   Anxiety   Sleeping Problem    Depression        Associated symptoms include decreased concentration and suicidal ideas.  Past medical history includes anxiety.   Anxiety Symptoms include decreased concentration, dizziness, nervous/anxious behavior and suicidal ideas. Patient reports no chest pain, confusion, palpitations or shortness of breath.      Albert [name] presents to the office today for follow-up of mixed bipolar, anxiety and still grieving stress of breakup.  He requires frequent follow-up because of long-term symptoms.  At visit October 25, 2018.  We made several medicine changes because of his concerns about sleep and other issues.  We increased olanzapine back to 7.5 mg bc not sleeping as well at 5 mg.  He has to have the prescription for quetiapine written for up to 3 tablets at night in case insomnia is worse because insomnia makes his mood disorder so much worse.  We discussed that was above the usual max but the request was granted given his treatment resistant status. We also reduce lithium from 900 mg daily to 750 mg daily to try to reduce tremor and muscle twitches.  At  visit March 28, 2019.  Fluoxetine was increased to 40 mg daily.   Early January 2021 increased to 60 mg daily.  No SE.  seen June 28, 2019.  No further meds were changed except olanzapine was increased back to 10 mg daily to see if if he could get additional benefit per his request.  Covid vaccinated.  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  Then had cholecystectomy.  April 2021 appt with the following noted: 2 episodes night sweats lately.  Memory has been very bad lately.  Repeatedly asks mother questions. Still  tend to stay in his room and his bed.  Still has rapid cycling mood  swings.  Maybe some better with increase in olanzapine to 10 and tolerating itl.  Would like to get out more but can't DT Covid.  Can perform necessary chores.  Will get out of the house when he can.  No change in death thoughts and anxiety in intensity but is better with frequency.  Usually comes and goes in waves but more persistent.  Consistent with meds.  Ativan not helping anxiety very dramatically but he's not sure.  Fidgety.  No trigger other than still grieving relationship and can't get it out of his head.  Poor energy, concentration and more forgetful. In bed more and less active.  Sleep ok lately which is unusual.  Can concentrate on financial matters and stock market at times.  Tolerating meds.  Still some intrusive SI without reason. Less frequent obsessive thoughts about broken relationship and has been doing this for months.  Can't let it go.  Loop.   No unusual stress even with the family who is supportive. Likes the benefit that Zyprexa gives. No sleepwalking nor falling nor odd behavior.  No history of sleep walking.  He understands this may recur with an increase.  Also wants option to rarely take extra quetiapine 300 for sleep prn. Plan:  Try to reduce lorazepam if possible.  10/22/2019 appointment with the following noted: Continues fluoxetine 60, lithium 600 mg, lorazepam 2 mg AM and  HS and 1 mg midday, olanzapine 10, quetiapine 600 mg HS. Still anxious chronically and including driving in crowded spaces.  Doesn't thing Ativan helps as well as Xanax but less cognitive problems. Sleep is not as good.  Occ EFA.  More EMA and wanting to do things in the middle of the night but this is not typical.  Wonders about why that happens.  Some napping.   Tolerating meds.  Asks about weight loss meds.  Disc this in detail.   Plan no changes except OK meclizine prn vertigo.  02/11/20 appt with the following noted: Able to gradually reduce lorazepam to 1 mg AM and HS. More depressed over time  and less interested in things and less interest in going out but does with his parents. Has reduced from 800 to 600 mg HS with some awakening but usually able to go back to sleep.Albert [name]  Spending a good amount of time in bed bc watches TV in bedroom.  Parents watch TV in different part of the house.  No mood swings he notices.   Concerns about weight gain about 200#. Likes olanzapine's benefit for sleep. Plan: Trial for TRD to  Increase fluoxetine to 80 mg daily  to use the combo with olanzapine for TR bipolar depression.   04/21/20 appt with following noted: I thought in beginning some benefit with fluoxetine.  Lifelong negative thinking continues.   Not much bipolar.  Reads a lot on bipolar.   Still tired and anxious a little more may be seasonal.  No familial stressors.  Tends to lay down in afternoon.  Anxiety not over anything in particular.   No SE with fluoxetine. Took meclizine prn.  Easily motion sick.    Asks questions about newer drugs for bipolar like Caplyta. Plan:  No med changes  06/30/20 appt noted: Tired of wearing masks.  Vaccinated.  Asked questions about when this will end with mask mandate.  Mood up and down some since here and getting up 2-3 times.  Disc awakening problems.  Trying to do better bc night eating some.   Mood is about average today so far.  Martin Majestic out with friends for breakfast.  Recognizes activity helps mood.   3 days in a row napped a lot in the afternoon.   Bed is a comfort place.  Gets bored and hard to motivate.  Tries to stay away from night eating and spending.   More depressed when alone and wonders about med changes. Plan: Failed response to olanzapine + fluoxetine for TR bipolar depression.   Reduce fluoxetine to 1 daily and reduce olanzapine to one half nightly for 10 days and then stop it. Then start [place] 1 daily  08/27/2020 appointment with the following noted: Patient decompensated with the above switch to [place] with intrusive suicidal thoughts  and had to be hospitalized for psychiatric reasons.  Multiple phone calls with family members since that time.  Spoke with the psychiatric nurse practitioner with the decision to restart olanzapine in place of the [place] given the patient was more stable while on the combination of Seroquel and olanzapine than with the [place]. Caplyta triggered HI/SI and depressed and confused on it, and agitated and still has some of it now. No akathisia. Frustrated with lack of therapy at the hospital.   Hydroxyzine didn't help jitteriness.  Feels some better.  Fleeting SI & HI but not obsessive like it was prior to hospitalization.  Feels a little amped up and nervous.  Scared of what  could happen.  Apprehensive being here talking about things. 5-6 hours sleep last night and would like to have more. At mom and dad's house right now and up more in the day. Inadvertently stopped lorazepam abruptly by mistake likely contributing to shakiness. Still some memory issues.  Trying to stay out of bed when watching TV.  Some awakening but managing. Occ fleeting SI and distracts himself.   Always spends a lot of time just laying around.   Can have anxiety for no reason like coming here, and varies in intensity without pattern.      Less panic than in the past.  Some chronic depression and hyperactivity and loudness and hyperverbal.  Usually back to sleep.  Total 6 hours but some napping, awakens 2-4 times nightly but back to sleep..  Taking quetiapine 600 mg HS.  still lacks interest and motivation.  Can follow a TV show if interested. Plan: Restart lorazepam 1 mg in the morning, 1 mg in the afternoon and 1 mg at night for anxiety Stop hydroxyzine  Increase olanzapine to 1 and 1/2 of 10 mg tablets in evening  09/05/2020 appt noted: seen with parents today at his request Made med changes noted and tolerated the changes Better than I did last week. Now only fleeting HI/SI and "no where near what it was".  Sleeping better.   Still not a lot of energy.  Anhedonia.  Less agitation.  A lot of anxiety all the time but it's helping.  Would like more energy and motivation.   Doesn't handle stress well. Parents note he's less shakey.  He's still staying with his parents.  Only driven once since this happened and F wants him to be able to drive and function more independently.  M agrees he's better.  Memory is better but not normal.  Primarily STM problems No akathisia with olanzapine this time.    Administering his own meds but mo watched. Slept 8 hours last night. Plan consider further reductions in quetiapine  10/17/20 appt noted: Stayed on Seroquel 600 HS and olanzapine 15. Olanzapine seemed to do most for the sleep.  Also helping eliminated HI/SI for the most part.  Wants to try to increase it.  Depression is much better with less rumination also.  Fears akathisia at higher dose as in the past. Also on fluoxetine 40.   Plan: Reduce Seroquel to 1 and 1/2 tablets at night Increase olanzapine to 1 of the 15 mg tablet and 1/2 of the 5 mg tablet for 1 week,  Then, if tolerated increase the olanzapine to 20 mg or 1 of the 15 mg tablets and 1 of the 5 mg tablets. If possible then also reduce quetiapine to 1 tablet at night.  11/05/2020 appointment with the following noted: Up to olanzapine 20 mg hs for 3 days.  Reduced Serooquel to 450 mg HS.  Didn't try to go lower. Had hiatal hernia and umbilical hernia surgery recently with no problem.   No akathisia so far. Sleep ok with meds so far changes.  No mood changes yet but overall likes the smoothness of olanzapine and helping sleep without knociing him out. Still some occ HI/SI, he thinks bc of anxiety generally.  Easily anxious. Plan: Continue olanzapine 20 mg nightly longer to give it time to help mood sx reduce quetiapine to 1 tablet at night to minimize polypharmacy and reduce risk of akathisia.  01/07/21 appt noted: Able to reduce quetiapine 300 mg HS ok with decent sleep  but  still wakes a lot.  No AM hangover.  Don't have a lot to do.  Can enjoy going out and doing things.  But doesn't do it longterm.  Family is doing good.  Father started year 22 at his job.   Tolerating the meds well.  May wake more with quetiapine reduction. Lost 10#. Wonders if could try other meds he didn't tolerate before bc now can tolerate olanzapine and couldn't tolerate it in the past DT akathisia. Mood never great but can swing into depresssion without reason.   Plan: Continue olanzapine 20 mg nightly longer to give it time to help mood sx reduce quetiapine to 1/2 of the 300 mg  tablet at night to minimize polypharmacy and reduce risk of akathisia.  02/02/2021 appointment with the following noted: Tried reduced quetiapine to 150 mg and after a week had withdrawal sx including jittery and SI and he increased to 250 mg daily.  Felt better after increasing to 300 mg daily and last week reduced to 250 mg daily.  After increase quetiapine felt better in a few days.  Seems more alert with less quetiapine but can't tolerate a quick withdrawal.   Some awakening.  Plan: Reduce quetiapine by 50 mg every 2-4 weeks.  03/09/2021 phone call: Pt called and said that he is weaning off the seroquel. He was down to 200 mg of the seoquel and he started having withdrawal symptoms and sucidal thoughts. He went back up to 250 mg because he knew he was good on that.    Pt stated he starting back taking 250 mg on Saturday due to the symptoms he was having.Now he is no longer having suicidal thoughts but still very jittery. MD: He may just need to taper slower than most people.  If he can tolerate the 250 mg Seroquel with the jitteriness it should get better over the next 1 to 2 weeks.  Then he can try going down again by 50 mg at a time.  He probably needs to go down by just 50 mg every 2 to 4 weeks and wait till he fully adjusts to each reduction before he reduces again.  03/17/21 appt noted: Hernia surgery 2  weeks ago. Seroquel 250 mg for a month then 200 mg daily and had SI and increased again to 300 mg daily.  Would love to get off it.  Withdrawal triggers intrusive HI/SI but otherwise doesn't have them No SE. Sleep is OK but not enough REM sleep.  Satisfied with other meds.   No current HI/SI.  No paranoia.  Still has anxiety and taking Ativan every 6 hours.  It works but doesn't cure it. Chronic anxiety and ask about ketamine for chronic depression. Plan: Because he had WD reducing from 250 to 200 mg daily will have to go slowly. Reduce quetiapine by 25 mg every 2-4 weeks. Reduce Seroquel to 250 mg for 2 weeks, Then reduce to 225 mg for 2 weeks, Then reduce to 200 mg for 2 weeks, Then reduce to 175 mg for 2 weeks, Then reduce to 150 mg Also: Buspirone 30 mg tablets for anxiety Start 1/3 tablet twice daily for 1 week Then increase to two thirds twice daily for 1 week Then increase to 1 tablet twice daily  04/15/2021 appointment with the following noted: Reduced Seroquel to 200 mg HS and increased buspirone to 30 mg BID STM px and wants to try to reduce Ativan to see if it is better.  But fears with drawal sx.  Parents complain about his memory. Less HI and SI since here. Less depressed. Plan: Buspirone 30 mg tablets for anxiety Start 1/3 tablet twice daily for 1 week Then increase to two thirds twice daily for 1 week Then increase to 1 tablet twice daily Continue olanzapine 20 mg nightly longer to give it time to help mood sx Because he had WD reducing from 250 to 200 mg daily will have to go slowly. Reduce quetiapine by 25 mg every 2-4 weeks. Reduce Seroquel to 250 mg for 2 weeks, Then reduce to 225 mg for 2 weeks, Then reduce to 200 mg for 2 weeks, Then reduce to 175 mg for 2 weeks, Then reduce to 150 mg Trial taper lorazepam: In hopes of improving short-term memory  04/30/2022 phone call: Complaining of withdrawal symptoms reducing lorazepam from 0.5 mg 5 times a day to 0.5  mg 4 times daily.  05/21/2021 appointment with the following noted: Gotten onto buspar 30 BID and down on lorazepam to 0.5 mg QID. Gotten down to Seroquel 200 mg HS A little restless every now and then but able to reduce lorazepam for the last weeks. Is too inactive and lays in bed too much watching TV.   Can' t tell effect of buspirone bc of other changes.  Sleep with awakening with 6-7 hours. Fleeting SI without trigger or plan or intent. Depressed more than anxious. Enjoys watching stocks and investing. Plan: Trial taper lorazepam gradually over time In hopes of improving short-term memory But for now continue lorazepam 0.5 mg QID Start clonidine 0.1 mg tablets one half at night for 3 nights, then 1/2 tablet twice daily for 3 nights, then one half in the morning and 1 tablet at night If clonidine does not noticeably help anxiety call the office  05/27/2021 phone call initially complaining of homicidal and suicidal thoughts after taking 1 mg of clonidine.  He was told to stop the medication.  But then called back stating he was having homicidal and suicidal thoughts from withdrawal from the medication which was not logical.  He agreed it was not logical and stated on 05/29/2021 that he felt better  06/23/2021 appointment the following noted: No clonidine. Sunday panic attack eating out and then having SI.  Stayed with father to feel safer. Intrusive thoughts of hurting parents or others.  HI only when has SI.  Usually panic without SI.  Then had intrusive thoughts about hurting others.   Usually intrusive thoughts are brief and fleeting.    07/20/21  appt noted; More anxious with less lithium.  More anxious in his body.  Not more depressed or irritable.  Sleep is the same.  More racing thoughts.  Chronic SI probably a litte worse but not unmanageable. Rduced lithium last week to 300 mg daily. No recent change in lisinopril. Off the clonidine for a week or so. Doesn't drink much water.   Drinks a lot of diet coke. No marked akathisia or RLS but does shake his leg DT anxiety. Worries about needing mor emedicine. Plan: Increase olanzapine to 25 mg nightly bc more racing thoughts and anxiety with less lithium DT severity of sx and difficulty getting off Seroquel conside rincrease.  07/30/21 TC : CO racing SI today and wonders about whether increased sx racing thoughts, hyperactivity, SI related to Reduced lithium from 600 mg daily to 300 mg daily recently DT lithium level 1.8.  could be related.    Plan: increase olanzapine 30 mg pm Today take lithium 600 mg now.  Tomorrow start lithium CR 450 mg daily Continue olanzapine 30 mg PM until SI resolves unless akathisia returns.  Lynder Parents, MD, DFAPA  08/17/21 appt noted:  No akathisia with olanzapine 30 mg pm (about 3 weeks) and is sleeping better. Increased lithium to 450 mg daily. Tolerating meds. Still racing negative thoughts maybe some better.  Asked about it. SI come and go.   Anxiety about the same as depression.  No motivation unless has to do something. Plan: Reduce  fluoxetine 20 mg 1 daily in hopes racing thoughts and anxiety are better.  09/14/2021 appointment with the following noted: No difference noted with reduction fluoxetine 20 mg daily. Most of the time negative thoughts, ex "what if I get in a wreck?"  Frequent negative thoughts.   Still on lorazepam 0.5 mg TID.   Wonders about trying prior meds that caused akathisia bc no longer gets it with olanzapine 30 mg daily. Sleep is usually ok.  Taking Seroquel 200 mg HS.  Thinks the olanzapine helps his sleep and doesn't want to stop it. Panic 1-2 times per month.  Then takes extra lorazepam. Plan: DC fluoxetine 20 mg 1 daily  Trial Auvelity off label for depression 1 in the AM for 1 week then 1 in AM and 1 with evening meal  10/15/2021 appointment with the following noted: Occ of getting confused as to day and went to church on a Thursday my mistake.   Had it  happen a few months ago too.  Memory is terrible. Thinks he couldn't tolerate more Auvelity. Not a lot of benefit from [place] 1 daily. Still quite a bit of anxiety.   A little drowsy  and sleeps 7-8 hours at night. Word finding problems.  Lost wallet. Plan: DC  Auvelity  For [facility]: memantine 1/2 tablet in the AM for 1 week, then 1/2 tablet twice daily, then 1/2 tablet in the AM and 1 tablet at night for 1 week then 1 tablet twice daily  10/22/2021 phone call complaining of additional stress asking to start new medication because of anxiety and more suicidal thoughts without intent or plan.  Reports taking more than 4 prescribed Ativan a day recently.  Asking to increase olanzapine which is already at 30 mg daily. MD response:We cannot go any higher in the dosage of olanzapine that he is currently taking.  A lot of these suicidal thoughts are apparently anxiety driven.  Let us have him resume taking paroxetine which is one of the more effective medicines for anxiety.  I will send it in and the instructions will be paroxetine 20 mg tablets 1/2 tablet daily for 1 week then 1 tablet daily  11/16/21 appt noted: Multiple phone calls since she was here.  Complaining of suicidal thoughts from memantine which was stopped. Started paroxetine and up to 20 mg daily.  Continues olanzapine 30 mg, lithium 450 mg nightly, lorazepam 0.5 mg 4 times daily , And quetiapine 200 mg nightly. Questions about whether memantine caused the neg SI really bc has them at times. When has SI often anxiety driven.  Will tend to ruminate on past relationship ended.   Edgy and irritable all the time but memantine seemed to make it worse. Mind is getting better with less SI lately.   No SE with paroxetine.   Aggrivated by forgetfulness.  eXample, talked with someone about going to breakfast last night and then forgot to go this mornin.g. Plan prescribed donepezil 5 mg daily for cognitive complaints specifically  forgetfulness  12/31/2021 appointment with the following noted: He had been prescribed donepezil 5 mg for cognitive complaints.  He called back reporting he felt it was causing homicidal thoughts and was instructed to stop it.  He has had these types of thoughts in the past.  He had no desire to act on them. Discouraged. Ongoing depression and reduced enjoyment and negative thoughts.  Can enjoy some things. A lot of anxiety chronically.  Trying to limit Ativan to 5 of 0.5 mg daily.  Sometimes needs 2. Constipation for years.  02/03/22 appt noted: Depression makes LBP worse. Ongoing chronic depression. Tolerated rivastigmine without triggering SI Chronic anxiety and Ativan helps some. Occ panic but not usual.  Asks if any other meds coud be used. Plan: But for now continue lorazepam 0.5 mg QID, but use LED for cog reasons Increase rivastigmine to 3 mg capsule 1 twice daily or 2 of the 1.5 mg capsules twice daily.  Call if you have problems with nausea Start dextromethorphan 1 capsule twice daily . Thi sis off label for depression based on MOA of Auvelity and use of paroxetine to prolong half life of DM. Presence of paroxetine may have been reason he didn't tolerate Auvelity brief trial before.  Will proceed slowly.  03/17/2022 appointment noted: Current psych meds: Rivastigmine 3 mg twice daily, quetiapine 200 mg nightly, paroxetine 40 mg daily, olanzapine 30 mg nightly, [place] 10 mg daily, lorazepam 0.5 mg 4 times daily, lithium CR 450 mg nightly, dextromethorphan 15 mg twice daily A lot of anxiety since here.  Some days taken more lorazepam to 6-7 tablets daily.  No major triggers but anxious even about coming here and other normal things.  Cancelled trip to collect rocks with friend and cancelled the trip bc anxious.  Usually if can go he is ok.  Lots of SI ongoing Memory might be a little better. Asks how he responded to [employer].  Will check paper chart.  03/24/22 TC:  CO SI worse and  restilless with Rivastigmine 9 mg . MD response:  Stop the rivastigmine since the [place] appears to be having side effects from it.  He was having anxiety at the last appointment 2 and then we increased the dose.  This might be causing the anxiety to be worse. He has chronic suicidal thoughts without intent or plan.  However it appears the thoughts are more intense with the rivastigmine     05/06/21 appt noted: Stopped rivastigmine DT anxiety and worsening SI.  Was better the next day. This week had some SI.  Thinking about the prior relationship tends to lead to SI. Will take Ativan when gets SI and it helps a little at 0.5 mg at a time. Plan: Consider fluvoxamine.  Yes this may help with obsessions on old GF and he failed other optioons. Start fluvoxamine 1/2 of 50 mg tablet and reduce paroxetine to 1 and 1/2 of 20 mg tablets for 5 days, Then increase fluvoxamine to 1 tablet daily and reduce paroxetine to 1 tablet daily for 5 days, Then increase fluvoxamine to 1-1/2 tablets daily and reduce paroxetine to 1/2 tablet daily for 5 days, Then increase fluvoxamine to 2 tablets daily and stop paroxetine   06/07/22 appt noted: Feels like the fluvoxamine 50 BID is too much.  On this a couple of weeks. Worries he might be worse with this with regard to SI.    He thinks it's worse with increase.   New neighbor who has a young boy. He worries that  Mancel Parsons is triggering depression and intrusive SI/HI a couple of days later and lasts a little while. Has had anxiety turning into panic attacks.  This gets associated with depression and SI/HI.  These thoughts are intrusive and he doesn't want to act on them.  Can have HI even towards family with whom he has no negative emotions.  Little things like a bump up in parking lot sets off his depression and anxiety and doesn't seem to handle things.   No sig akathisia.  In the past it's been continuous.   Awakens every 2-3 hours.  Usually goes back to sleep with  meds. Has not had alcohol in 20 years and had some intrusive thoughts without drinking.   Plan: As soon as he feels comfortable increase fluvoxamine to 50 mg AM and 100 mg PM for intrusive thoughts of HI/SI and obs on GF  07/06/22 appt noted: Increased fluvoxamine to 150 mg daily and felt more agitated and some SI so reduced to 100 mg 4 days ago and feels better so far.  Likes the fact it is good for anxiety and obs thoughts.  Wonders if there is a similar med.  Frustrated he can't get relief.  Some improvement in obs on exGF but not resolved. Continue other meds: lithium CR 450 mg HS, lorazepam 0.5 mg QID, Memantine 10 BID, olanzapine 30 mg HS, quetiapine 200 mg HS Tingling and burning in legs at night but doesn't interfere with sleep. Got insurance to pay for [facility] and lost 13#.   No full panic lately. But some anxiety waves.   Still major problems with memory   Psych med hx extensive including ECT and  risperidone, Zyprexa 30 Latuda 80 which caused akathisia,  Vraylar, Rexulti, aripiprazole 20 mg with akathisia,  Seroquel 1000 mg,  InVega, Geodon, Saphris with side effects, symbyax, Fanapt NR .  Caplyta SE and markedly worse. lithium 1200 SE,  lamotrigine 300 mg, Depakote 2000 mg, Tegretol, Trileptal and several of these in combinations, gabapentin,  N-acetylcysteine, Nuedexta,     Belsomra with no response,  Lunesta no response, trazodone 200 mg,  Xanax, clonazepam, lorazepam less sedation. Buspirone NR Clonidine SE with 2 trials  Viibryd 40 mg for 3 months with diarrhea, protriptyline with side effects,  Trintellix 20 mg,  Parnate 50 mg with no response,   imipramine, venlafaxine,  Emsam 12 mg for 2 months,   bupropion was side effects,  Lexapro 20 mg, sertraline, paroxetine, Deplin, fluoxetine 80, fluvoxamine 150 agitated Auvelity NR at one daily.  SE BID  methylphenidate 60 mg,  Vyvanse, Concerta, strattera, , modafinil,  Memantine worsening SI? Donepezil, complained of  HI, Exelon 3 BID  pramipexole,  amantadine ,  Patient prone to akathisia.   Review of Systems:  Review of Systems  Respiratory:  Negative for shortness of breath.   Cardiovascular:  Negative for chest pain and palpitations.  Musculoskeletal:  Positive for back pain.  Neurological:  Positive for dizziness and tremors.       Fidgety  Psychiatric/Behavioral:  Positive for decreased concentration, depression and suicidal ideas. Negative for agitation, behavioral problems, confusion, dysphoric mood, hallucinations, self-injury and sleep disturbance. The patient is nervous/anxious. The patient is not hyperactive.     Medications: I have reviewed the patient's current medications.  Current Outpatient Medications  Medication Sig Dispense Refill   acetaminophen (TYLENOL) 500 MG tablet Take 1,000 mg by mouth every 6 (six) hours as needed for moderate pain.     bisacodyl (DULCOLAX) 10 MG suppository  Place 1 suppository (10 mg total) rectally as needed for moderate constipation. 12 suppository 0   docusate sodium (COLACE) 100 MG capsule Take 1 capsule (100 mg total) by mouth 2 (two) times daily. 60 capsule 2   fenofibrate (TRICOR) 145 MG tablet Take 145 mg by mouth daily.     fluticasone (FLONASE) 50 MCG/ACT nasal spray Place 1 spray into both nostrils daily as needed for allergies or rhinitis.     fluvoxaMINE (LUVOX) 50 MG tablet 1 in the AM and 2 tablets in PM 90 tablet 1   hydrocortisone (ANUSOL-HC) 2.5 % rectal cream Place 1 application rectally 3 (three) times daily as needed for hemorrhoids.     levothyroxine (SYNTHROID, LEVOTHROID) 75 MCG tablet Take 75 mcg by mouth daily before breakfast.     lisinopril (ZESTRIL) 5 MG tablet Take 5 mg by mouth daily.     lithium carbonate (ESKALITH) 450 MG ER tablet TAKE ONE TABLET BY MOUTH AT BEDTIME 90 tablet 0   LORazepam (ATIVAN) 0.5 MG tablet TAKE ONE TABLET BY MOUTH FOUR TIMES DAILY 120 tablet 1   meclizine (ANTIVERT) 25 MG tablet Take 1 tablet (25  mg total) by mouth 3 (three) times daily as needed for dizziness. 30 tablet 1   memantine (NAMENDA) 10 MG tablet Take by mouth.     OLANZapine (ZYPREXA) 10 MG tablet TAKE 1 TABLET BY MOUTH AT BEDTIME 90 tablet 0   OLANZapine (ZYPREXA) 20 MG tablet Take 1 tablet (20 mg total) by mouth at bedtime. 90 tablet 1   polycarbophil (FIBERCON) 625 MG tablet Take 1,250 mg by mouth daily.     QUEtiapine (SEROQUEL) 200 MG tablet TAKE 1 TABLET BY MOUTH AT BEDTIME 90 tablet 0   sildenafil (VIAGRA) 100 MG tablet Take 1 tablet (100 mg total) by mouth daily as needed for erectile dysfunction. 30 tablet 6   tamsulosin (FLOMAX) 0.4 MG CAPS capsule Take 1 capsule (0.4 mg total) by mouth daily. 90 capsule 3   No current facility-administered medications for this visit.   Medication Side Effects: Other: mild sleepiness.   Occ twitches.\, tremor  Allergies:  Allergies  Allergen Reactions   Meloxicam Other (See Comments)    Other reaction(s): Dizziness   Nsaids     Other reaction(s): Hallucination, Other (See Comments)   Ibuprofen     Can not take because taking lithium   Prednisone     Can't sleep    Wellbutrin [Bupropion]     Suicide thoughts    Past Medical History:  Diagnosis Date   ADHD (attention deficit hyperactivity disorder)    Anemia    Anxiety    Bipolar disorder (HCC)    BPH (benign prostatic hyperplasia)    Chronic kidney disease, stage 3b (HCC)    DDD (degenerative disc disease), cervical    Depression    Deviated septum    ED (erectile dysfunction)    GERD (gastroesophageal reflux disease)    Graves disease    History of hiatal hernia    Hypertension    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    Hypothyroidism    Pneumonia    PONV (postoperative nausea and vomiting)     Family History  Problem Relation Age of Onset   Prostate cancer Neg Hx    Bladder Cancer Neg Hx    Kidney cancer Neg Hx     Social History   Socioeconomic History   Marital status:  Single    Spouse name: Not  on file   Number of children: Not on file   Years of education: Not on file   Highest education level: Not on file  Occupational History   Not on file  Tobacco Use   Smoking status: Former    Types: Cigarettes    Passive exposure: Past   Smokeless tobacco: Former    Types: Nurse, children's Use: Never used  Substance and Sexual Activity   Alcohol use: Not Currently   Drug use: Never   Sexual activity: Yes  Other Topics Concern   Not on file  Social History Narrative   Not on file   Social Determinants of Health   Financial Resource Strain: Not on file  Food Insecurity: Not on file  Transportation Needs: Not on file  Physical Activity: Not on file  Stress: Not on file  Social Connections: Not on file  Intimate Partner Violence: Not on file    Past Medical History, Surgical history, Social history, and Family history were reviewed and updated as appropriate.   Please see review of systems for further details on the patient's review from today.   Objective:   Physical Exam:  There were no vitals taken for this visit.  Physical Exam Constitutional:      General: He is not in acute distress.    Appearance: He is well-developed.  Musculoskeletal:        General: No deformity.  Neurological:     Mental Status: He is alert and oriented to person, place, and time.     Cranial Nerves: No dysarthria.     Motor: Tremor present.     Coordination: Coordination normal.     Comments: mild tremor  Psychiatric:        Attention and Perception: Attention and perception normal. He does not perceive auditory or visual hallucinations.        Mood and Affect: Mood is anxious and depressed. Affect is not labile, blunt, angry, tearful or inappropriate.        Speech: Speech normal. Speech is not slurred.        Behavior: Behavior normal. Behavior is not slowed. Behavior is cooperative.        Thought Content: Thought content is not delusional.  Thought content includes suicidal ideation. Thought content does not include homicidal ideation. Thought content does not include suicidal plan.        Cognition and Memory: Cognition normal. He exhibits impaired recent memory.        Judgment: Judgment normal.     Comments: Insight fair Chronically talkative .directable. He is chronically anxious  worse with less lithium No manic signs noted. Some wordfinding problems ongoing. Intrusive thoughts lately trigger SI Fidgety today but not uncomfortable      Lab Review:     Component Value Date/Time   NA 139 02/26/2021 1253   NA 142 03/20/2014 0514   K 4.0 02/26/2021 1253   K 4.1 03/20/2014 0514   CL 109 02/26/2021 1253   CL 112 (H) 03/20/2014 0514   CO2 22 02/26/2021 1253   CO2 26 03/20/2014 0514   GLUCOSE 83 02/26/2021 1253   GLUCOSE 99 03/20/2014 0514   BUN 13 02/26/2021 1253   BUN 7 03/20/2014 0514   CREATININE 1.56 (H) 02/26/2021 1253   CREATININE 0.78 03/20/2014 0514   CALCIUM 9.6 02/26/2021 1253   CALCIUM 9.5 03/20/2014 0514   PROT 7.8 08/11/2020 1750   PROT 7.3 03/18/2014 1459   ALBUMIN 4.7 08/11/2020  1750   ALBUMIN 3.2 (L) 03/18/2014 1459   AST 23 08/11/2020 1750   AST 16 03/18/2014 1459   ALT 16 08/11/2020 1750   ALT 16 03/18/2014 1459   ALKPHOS 34 (L) 08/11/2020 1750   ALKPHOS 105 03/18/2014 1459   BILITOT 0.5 08/11/2020 1750   BILITOT 0.3 03/18/2014 1459   GFRNONAA 51 (L) 02/26/2021 1253   GFRNONAA >60 03/20/2014 0514   GFRAA 54 (L) 04/18/2019 1612   GFRAA >60 03/20/2014 0514       Component Value Date/Time   WBC 6.2 02/26/2021 1253   RBC 4.17 (L) 02/26/2021 1253   HGB 13.3 02/26/2021 1253   HGB 12.6 (L) 03/20/2014 0514   HCT 39.4 02/26/2021 1253   HCT 38.4 (L) 03/20/2014 0514   PLT 431 (H) 02/26/2021 1253   PLT 346 03/20/2014 0514   MCV 94.5 02/26/2021 1253   MCV 89 03/20/2014 0514   MCH 31.9 02/26/2021 1253   MCHC 33.8 02/26/2021 1253   RDW 13.1 02/26/2021 1253   RDW 12.5 03/20/2014 0514    LYMPHSABS 3.2 03/20/2014 0514   MONOABS 1.0 03/20/2014 0514   EOSABS 0.4 03/20/2014 0514   BASOSABS 0.1 03/20/2014 0514    Lithium Lvl  Date Value Ref Range Status  08/12/2021 0.9 0.5 - 1.2 mmol/L Final    Comment:    A concentration of 0.5-0.8 mmol/L is advised for long-term use; concentrations of up to 1.2 mmol/L may be necessary during acute treatment.                                  Detection Limit = 0.1                           <0.1 indicates None Detected    08/12/21 lithium level 0.9 on 450 mg.  lithium level Sept 0.8.   lithium level July 27, 2018 was normal at 1.0.   Lithium level [facility] October 03, 2018 = 1.2. Said he got lithium level at [place] as requested.  Labs not in Epic.  Recent lipids ok except higher TG than usual.  Normal A1C.  No results found for: "PHENYTOIN", "PHENOBARB", "VALPROATE", "CBMZ"   .res Assessment: Plan:    Bartt was seen today for follow-up, depression, manic behavior, anxiety and sleeping problem.  Diagnoses and all orders for this visit:  Severe bipolar I disorder, current or most recent episode depressed (HCC)  Generalized anxiety disorder  Panic disorder with agoraphobia  Mild cognitive impairment  Insomnia due to mental condition  Lithium use    Chronic TR bipolar mixed and chronic anxiety.  He usually has mixed bipolar symptoms which we have not been able to completely eliminate.  See long list of meds tried.   He is somewhat chronically unstable but better on olanzapine worse if reduces quetiapine quickly.  Discussed mother's concerns about his memory which is primarily a short-term memory issue.  We discussed the alternative options of using Namenda off label for mild cognitive impairment.  We are not yet able to reduce the dose of lorazepam but that could potentially help as well.  His memory was much worse on Xanax than lorazepam. We discussed the short-term risks associated with benzodiazepines including sedation and  increased fall risk among others.  Discussed long-term side effect risk including dependence, potential withdrawal symptoms, and the potential eventual dose-related risk of dementia. Disc newer studies  refute dementia risks.  Unfortunately due to the severity of set his symptoms polypharmacy is a necessity.  Disc unusual combo antipsychotics but it helps more than other options.  Discussed potential metabolic side effects associated with atypical antipsychotics, as well as potential risk for movement side effects. Advised pt to contact office if movement side effects occur.  He had some falling around the time when he previously took olanzapine but he took it for several months at this dosage before he had any side effect issues and he was on Xanax at the time that the following occurred. Take LED Seroquel to sleep.   No obvious alternatives for sleep.    Lithium being used bc chronic SI and death thoughts.   Counseled patient regarding potential benefits, risks, and side effects of lithium to include potential risk of lithium affecting thyroid and renal function.  Discussed need for periodic lab monitoring to determine drug level and to assess for potential adverse effects.  Counseled patient regarding signs and symptoms of lithium toxicity and advised that they notify office immediately or seek urgent medical attention if experiencing these signs and symptoms.  Patient advised to contact office with any questions or concerns. Continue lithium  450 mg daily Lithium level 0.7 on 3/115/23 on 300 mg daily Call if death thoughts worsen or worsening SI.   Disc SE. Disc combo with Seroquel may lower response rate but he doesn't think he can sleep without Seroquel.  continue olanzapine to 30 mg nightly  for more racing thoughts and anxiety with less lithium DT severity of sx and difficulty getting off Seroquel conside rincrease.  Disc in detail again clozapine.  Disc data showing less SI with it.  Disc  risk in detail including low WBC with complication, myocarditis.  Extensive discussion of CBC monitoring.  Disc this again.  He doesn't want to do this now  Disc this again bc taking Wegovy might help him to tolerate it better.  He's started [facility]  Disc WD  and may need to go lower before he can stop it. continue Seroquel 200 mg .  Would like to see him taper off if possible.  Wean fluvoxamine 50 mg daily for a month then 1/2 daily for 2 weeks then stop it DT  NR.  Watch for more anxiety.  Did he take duloxetine?  Might help depression more.  Consider retry pramipexole off label   But for now continue lorazepam 0.5 mg QID, but use LED for cog reasons  For [facility] had to stop rivastigmine DT worsening SI  CBT dealing with obsession around old GF discussed.  We discussed the short-term risks associated with benzodiazepines including sedation and increased fall risk among others.  Discussed long-term side effect risk including dependence, potential withdrawal symptoms, and the potential eventual dose-related risk of dementia.  But recent studies from 2020 dispute this association between benzodiazepines and dementia risk. Newer studies in 2020 do not support an association with dementia.  Discussed safety plan at length with patient.  Advised patient to contact office with any worsening signs and symptoms.  Instructed patient to go to the [facility] emergency room for evaluation if experiencing any acute safety concerns, to include suicidal intent.  He commits to safety.  Disc Ozempic and Monjauro for weight loss.  Has had weight gain from meds as a contributor. But lately has lost some weight.  Disc Dr. Ardelia Mems mentioned evidence it could help depression.  I rec he continue this also.  Unlikely to worsen  SI  Needs to keep up activity as much as possible for depression.  Take Miralax daily for constipation.  Could be SE Seroquel.  This appt was 30 mins.  FU 4 weeks   Lynder Parents, MD,  DFAPA  Future Appointments  Date Time Provider Altha  08/05/2022  1:30 PM Cottle, Billey Co., MD [facility] None  09/07/2022  1:00 PM Cottle, Billey Co., MD [facility] None  10/06/2022  1:00 PM Cottle, Billey Co., MD [facility] None  12/30/2022  2:30 PM Billey Co, MD [facility] None    No orders of the defined types were placed in this encounter.      -------------------------------

## 2022-07-06 NOTE — Patient Instructions (Signed)
Wean fluvoxamine 50 mg daily for a month then 1/2 daily for 2 weeks then stop it.  Watch for more anxiety.

## 2022-07-20 ENCOUNTER — Other Ambulatory Visit: Payer: Self-pay | Admitting: Psychiatry

## 2022-07-20 DIAGNOSIS — F411 Generalized anxiety disorder: Secondary | ICD-10-CM

## 2022-07-20 DIAGNOSIS — F314 Bipolar disorder, current episode depressed, severe, without psychotic features: Secondary | ICD-10-CM

## 2022-07-20 DIAGNOSIS — F4001 Agoraphobia with panic disorder: Secondary | ICD-10-CM

## 2022-07-28 ENCOUNTER — Telehealth (INDEPENDENT_AMBULATORY_CARE_PROVIDER_SITE_OTHER): Payer: Medicare PPO | Admitting: Psychiatry

## 2022-07-28 ENCOUNTER — Other Ambulatory Visit: Payer: Self-pay | Admitting: Psychiatry

## 2022-07-28 DIAGNOSIS — F314 Bipolar disorder, current episode depressed, severe, without psychotic features: Secondary | ICD-10-CM

## 2022-07-28 DIAGNOSIS — F411 Generalized anxiety disorder: Secondary | ICD-10-CM | POA: Diagnosis not present

## 2022-07-28 DIAGNOSIS — R6889 Other general symptoms and signs: Secondary | ICD-10-CM | POA: Diagnosis not present

## 2022-07-28 DIAGNOSIS — Z79899 Other long term (current) drug therapy: Secondary | ICD-10-CM | POA: Diagnosis not present

## 2022-07-28 NOTE — Telephone Encounter (Signed)
Pt's mom LVM @ 1:02p.  She is on the [facility].  She is asking Shelton Silvas to call her back about the pt's meds.  Next appt 4/4

## 2022-07-28 NOTE — Telephone Encounter (Signed)
Mom called reporting patient has been confused, dizzy, and sluggish for the last 3 days. He is now staying with her. She had several questions in regards to this:  Lithium level - she feels he needs one done, last result in Epic was 08/2021 that I see. She would like order sent to [facility] for tomorrow.  Patient is on Wegovy, has been for about a month, and she wonders if this could cause sx.  Patient has meclizine and she is questioning if she should give that for his dizziness considering other sx.  She questions if perhaps he got up during the night and took additional medications, though doesn't know if this happened and if so what he took.   On 3/5 visit he was to wean fluvoxamine to 50 mg qd for a month and then 1/2 tab for 2 weeks and then stop. Mom and pt unsure if he started the wean.   He has F/U 4/4.

## 2022-07-28 NOTE — Telephone Encounter (Signed)
Yes get lithium level in the AM and don't take any lithium tonight or in the morning before the blood test.    Albert [name] should not cause these sx except if he is losing wt quickly or is dehydrated it can cause lithium toxicity and include these sx.  He needs to drink lots of water.  He should go ahead and wean the fluvoxamine as instructed in the last appt.  If the sx get worse, go to the ER.

## 2022-07-29 NOTE — Telephone Encounter (Addendum)
RTC  Virtual Visit via Telephone Note  I connected with pt by telephone and verified that I am speaking with the correct person using two identifiers.   I discussed the limitations, risks, security and privacy concerns of performing an evaluation and management service by telephone and the availability of in person appointments. I also discussed with the patient that there may be a patient responsible charge related to this service. The patient expressed understanding and agreed to proceed.  I discussed the assessment and treatment plan with the patient. The patient was provided an opportunity to ask questions and all were answered. The patient agreed with the plan and demonstrated an understanding of the instructions.   The patient was advised to call back or seek an in-person evaluation if the symptoms worsen or if the condition fails to improve as anticipated.  I provided 15 minutes of non-face-to-face time during this encounter. The call started at 630 and ended at 24. The patient was located at home  and the provider was located office.   Sx not as severe as when he had to go to hospital in the past.  Albert [name] got Lithium level today.   Speech is not good .  Confused at times, but better today.  A little tremor .  A little balance problems.  No falls.  No diarrhea Took lihtium 450 last night. Mo says he got up in the middle of the night and moved things around last night and he doesn't rmember.   No change in night time meds.   Gradually reducing fluvoxamine.   No extra lorazepam.    Spoke with mother who verifies the same.  No med changes but push fluids.  Albert Staggers, MD, DFAPA

## 2022-07-31 LAB — BASIC METABOLIC PANEL
BUN/Creatinine Ratio: 6 — ABNORMAL LOW (ref 9–20)
BUN: 26 mg/dL — ABNORMAL HIGH (ref 6–24)
CO2: 20 mmol/L (ref 20–29)
Calcium: 10.1 mg/dL (ref 8.7–10.2)
Chloride: 100 mmol/L (ref 96–106)
Creatinine, Ser: 4.11 mg/dL — ABNORMAL HIGH (ref 0.76–1.27)
Glucose: 93 mg/dL (ref 70–99)
Potassium: 4.9 mmol/L (ref 3.5–5.2)
Sodium: 135 mmol/L (ref 134–144)
eGFR: 16 mL/min/{1.73_m2} — ABNORMAL LOW (ref >59)

## 2022-07-31 LAB — LITHIUM LEVEL: Lithium Lvl: 1.4 mmol/L (ref 0.5–1.2)

## 2022-07-31 LAB — TSH: TSH: 0.289 u[IU]/mL — ABNORMAL LOW (ref 0.450–4.500)

## 2022-08-01 ENCOUNTER — Encounter (HOSPITAL_COMMUNITY): Payer: Self-pay

## 2022-08-01 ENCOUNTER — Emergency Department (HOSPITAL_COMMUNITY): Payer: Medicare PPO

## 2022-08-01 ENCOUNTER — Emergency Department (HOSPITAL_COMMUNITY)
Admission: EM | Admit: 2022-08-01 | Discharge: 2022-08-02 | Disposition: A | Discharge status: Home / Self Care | Payer: Medicare PPO | Attending: Emergency Medicine | Admitting: Emergency Medicine

## 2022-08-01 DIAGNOSIS — F411 Generalized anxiety disorder: Secondary | ICD-10-CM | POA: Diagnosis not present

## 2022-08-01 DIAGNOSIS — F314 Bipolar disorder, current episode depressed, severe, without psychotic features: Secondary | ICD-10-CM | POA: Insufficient documentation

## 2022-08-01 DIAGNOSIS — N1832 Chronic kidney disease, stage 3b: Secondary | ICD-10-CM | POA: Diagnosis not present

## 2022-08-01 DIAGNOSIS — E039 Hypothyroidism, unspecified: Secondary | ICD-10-CM | POA: Insufficient documentation

## 2022-08-01 DIAGNOSIS — T56891A Toxic effect of other metals, accidental (unintentional), initial encounter: Secondary | ICD-10-CM | POA: Insufficient documentation

## 2022-08-01 DIAGNOSIS — I129 Hypertensive chronic kidney disease with stage 1 through stage 4 chronic kidney disease, or unspecified chronic kidney disease: Secondary | ICD-10-CM | POA: Insufficient documentation

## 2022-08-01 DIAGNOSIS — Z79899 Other long term (current) drug therapy: Secondary | ICD-10-CM

## 2022-08-01 DIAGNOSIS — R45851 Suicidal ideations: Secondary | ICD-10-CM | POA: Diagnosis not present

## 2022-08-01 DIAGNOSIS — Z87891 Personal history of nicotine dependence: Secondary | ICD-10-CM | POA: Diagnosis not present

## 2022-08-01 DIAGNOSIS — E860 Dehydration: Secondary | ICD-10-CM

## 2022-08-01 DIAGNOSIS — N179 Acute kidney failure, unspecified: Secondary | ICD-10-CM

## 2022-08-01 HISTORY — DX: Constipation, unspecified: K59.00

## 2022-08-01 LAB — TSH: TSH: 0.06 u[IU]/mL — ABNORMAL LOW (ref 0.350–4.500)

## 2022-08-01 LAB — URINALYSIS, ROUTINE W REFLEX MICROSCOPIC
Bilirubin Urine: NEGATIVE
Glucose, UA: NEGATIVE mg/dL
Hgb urine dipstick: NEGATIVE
Ketones, ur: NEGATIVE mg/dL
Leukocytes,Ua: NEGATIVE
Nitrite: NEGATIVE
Protein, ur: NEGATIVE mg/dL
Specific Gravity, Urine: 1.006 (ref 1.005–1.030)
pH: 6 (ref 5.0–8.0)

## 2022-08-01 LAB — COMPREHENSIVE METABOLIC PANEL
ALT: 14 U/L (ref 0–44)
AST: 20 U/L (ref 15–41)
Albumin: 4.7 g/dL (ref 3.5–5.0)
Alkaline Phosphatase: 25 U/L — ABNORMAL LOW (ref 38–126)
Anion gap: 9 (ref 5–15)
BUN: 26 mg/dL — ABNORMAL HIGH (ref 6–20)
CO2: 21 mmol/L — ABNORMAL LOW (ref 22–32)
Calcium: 9.8 mg/dL (ref 8.9–10.3)
Chloride: 107 mmol/L (ref 98–111)
Creatinine, Ser: 2.35 mg/dL — ABNORMAL HIGH (ref 0.61–1.24)
GFR, Estimated: 31 mL/min — ABNORMAL LOW (ref >60)
Glucose, Bld: 90 mg/dL (ref 70–99)
Potassium: 4.4 mmol/L (ref 3.5–5.1)
Sodium: 137 mmol/L (ref 135–145)
Total Bilirubin: 0.7 mg/dL (ref 0.3–1.2)
Total Protein: 7.2 g/dL (ref 6.5–8.1)

## 2022-08-01 LAB — RAPID URINE DRUG SCREEN, HOSP PERFORMED
Amphetamines: NOT DETECTED
Barbiturates: NOT DETECTED
Benzodiazepines: POSITIVE — AB
Cocaine: NOT DETECTED
Opiates: NOT DETECTED
Tetrahydrocannabinol: NOT DETECTED

## 2022-08-01 LAB — CBC WITH DIFFERENTIAL/PLATELET
Abs Immature Granulocytes: 0.02 10*3/uL (ref 0.00–0.07)
Basophils Absolute: 0.1 10*3/uL (ref 0.0–0.1)
Basophils Relative: 1 %
Eosinophils Absolute: 0.4 10*3/uL (ref 0.0–0.5)
Eosinophils Relative: 6 %
HCT: 35.8 % — ABNORMAL LOW (ref 39.0–52.0)
Hemoglobin: 11.6 g/dL — ABNORMAL LOW (ref 13.0–17.0)
Immature Granulocytes: 0 %
Lymphocytes Relative: 27 %
Lymphs Abs: 1.7 10*3/uL (ref 0.7–4.0)
MCH: 31.4 pg (ref 26.0–34.0)
MCHC: 32.4 g/dL (ref 30.0–36.0)
MCV: 97 fL (ref 80.0–100.0)
Monocytes Absolute: 0.5 10*3/uL (ref 0.1–1.0)
Monocytes Relative: 8 %
Neutro Abs: 3.6 10*3/uL (ref 1.7–7.7)
Neutrophils Relative %: 58 %
Platelets: 376 10*3/uL (ref 150–400)
RBC: 3.69 MIL/uL — ABNORMAL LOW (ref 4.22–5.81)
RDW: 12.7 % (ref 11.5–15.5)
WBC: 6.3 10*3/uL (ref 4.0–10.5)
nRBC: 0 % (ref 0.0–0.2)

## 2022-08-01 LAB — MAGNESIUM: Magnesium: 2.1 mg/dL (ref 1.7–2.4)

## 2022-08-01 LAB — ETHANOL: Alcohol, Ethyl (B): <10 mg/dL (ref <10)

## 2022-08-01 LAB — LITHIUM LEVEL: Lithium Lvl: 1.13 mmol/L (ref 0.60–1.20)

## 2022-08-01 MED ORDER — OLANZAPINE 10 MG PO TABS
20.0000 mg | ORAL_TABLET | Freq: Every day | ORAL | Status: DC
  Administered 2022-08-01: 20 mg via ORAL
  Filled 2022-08-01: qty 2

## 2022-08-01 MED ORDER — LORAZEPAM 0.5 MG PO TABS
0.5000 mg | ORAL_TABLET | Freq: Once | ORAL | Status: AC
  Administered 2022-08-01: 0.5 mg via ORAL
  Filled 2022-08-01: qty 1

## 2022-08-01 MED ORDER — QUETIAPINE FUMARATE 100 MG PO TABS
200.0000 mg | ORAL_TABLET | Freq: Every day | ORAL | Status: DC
  Administered 2022-08-01: 200 mg via ORAL
  Filled 2022-08-01: qty 2

## 2022-08-01 MED ORDER — DOCUSATE SODIUM 100 MG PO CAPS
100.0000 mg | ORAL_CAPSULE | Freq: Two times a day (BID) | ORAL | Status: DC
  Administered 2022-08-01 – 2022-08-02 (×2): 100 mg via ORAL
  Filled 2022-08-01 (×2): qty 1

## 2022-08-01 MED ORDER — POLYETHYLENE GLYCOL 3350 17 G PO PACK
17.0000 g | PACK | Freq: Every day | ORAL | Status: DC
  Administered 2022-08-01 – 2022-08-02 (×2): 17 g via ORAL
  Filled 2022-08-01 (×2): qty 1

## 2022-08-01 MED ORDER — LEVOTHYROXINE SODIUM 50 MCG PO TABS
75.0000 ug | ORAL_TABLET | Freq: Every day | ORAL | Status: DC
  Administered 2022-08-02: 75 ug via ORAL
  Filled 2022-08-01: qty 1

## 2022-08-01 MED ORDER — MEMANTINE HCL 5 MG PO TABS
10.0000 mg | ORAL_TABLET | Freq: Every day | ORAL | Status: DC
  Administered 2022-08-02: 10 mg via ORAL
  Filled 2022-08-01: qty 2

## 2022-08-01 MED ORDER — LACTATED RINGERS IV BOLUS
1000.0000 mL | Freq: Once | INTRAVENOUS | Status: AC
  Administered 2022-08-01: 1000 mL via INTRAVENOUS

## 2022-08-01 NOTE — ED Provider Notes (Signed)
[facility] Provider Note   CSN: [id number] Arrival date & time: 08/01/22  1701     History {Add pertinent medical, surgical, social history, OB history to HPI:1} Chief Complaint  Patient presents with   Psychiatric Evaluation    Albert [name] is a 57 y.o. male with CKD stage 3, BPH, bipolar disorder, GERD s/p repair of paraesophageal hernia, anemia, GAD who presents with psych eval.  Patient  increasing confusion, shakiness, and suicidal thoughts. Pt does not have a plan. No HI, AVH. Per chart review patient does follow closely with psychiatry.  He is treated with lithium for bipolar disorder. level has recently been checked and was high at 1.4. States he deals with chronic tremoring but feels like it's worse than normal over the last few days. Hasn't taken any extra of his lithium. Psychiatrist told him to hold his lithium last night so he didn't take it, then to halve the dose which he was going to start today. Denies any nausea/vomiting, abdominal pain, CP, SOB, urinary symptoms, f/c, numbness/tingling. Endorses chronic and worsening SI which also brought him here today. Denies any plan for SI, no access to guns, has never attempted suicide in the past.  Has been hospitalized for SI once a couple of years ago.  He also endorses HI against his parents but has no plan.  Denies any AH VH, denies any drug use.  Quit alcohol 20 years ago.    HPI     Home Medications Prior to Admission medications   Medication Sig Start Date End Date Taking? Authorizing Provider  acetaminophen (TYLENOL) 500 MG tablet Take 1,000 mg by mouth every 6 (six) hours as needed for moderate pain.    [provider]  bisacodyl (DULCOLAX) 10 MG suppository Place 1 suppository (10 mg total) rectally as needed for moderate constipation. 04/02/22   Lavonia Drafts, MD  docusate sodium (COLACE) 100 MG capsule Take 1 capsule (100 mg total) by mouth 2 (two) times daily.  04/02/22 04/02/23  Lavonia Drafts, MD  fenofibrate (TRICOR) 145 MG tablet Take 145 mg by mouth daily. 04/02/19   [provider]  fluticasone (FLONASE) 50 MCG/ACT nasal spray Place 1 spray into both nostrils daily as needed for allergies or rhinitis.    [provider]  fluvoxaMINE (LUVOX) 50 MG tablet 1 in the AM and 2 tablets in PM 06/07/22   Cottle, Billey Co., MD  hydrocortisone (ANUSOL-HC) 2.5 % rectal cream Place 1 application rectally 3 (three) times daily as needed for hemorrhoids. 05/20/20   [provider]  levothyroxine (SYNTHROID, LEVOTHROID) 75 MCG tablet Take 75 mcg by mouth daily before breakfast. 09/14/17   [provider]  lisinopril (ZESTRIL) 5 MG tablet Take 5 mg by mouth daily. 10/19/19   [provider]  lithium carbonate (ESKALITH) 450 MG ER tablet TAKE ONE TABLET BY MOUTH AT BEDTIME 05/28/22   Cottle, Billey Co., MD  LORazepam (ATIVAN) 0.5 MG tablet TAKE ONE TABLET BY MOUTH FOUR TIMES DAILY 03/30/22   Cottle, Billey Co., MD  meclizine (ANTIVERT) 25 MG tablet Take 1 tablet (25 mg total) by mouth 3 (three) times daily as needed for dizziness. 10/15/21   Cottle, Billey Co., MD  memantine (NAMENDA) 10 MG tablet Take by mouth. 12/18/21   [provider]  OLANZapine (ZYPREXA) 10 MG tablet TAKE 1 TABLET BY MOUTH AT BEDTIME 05/07/22   Cottle, Billey Co., MD  OLANZapine (ZYPREXA) 20 MG tablet  TAKE ONE TABLET BY MOUTH AT BEDTIME 07/20/22   Cottle, Billey Co., MD  polycarbophil (FIBERCON) 625 MG tablet Take 1,250 mg by mouth daily.    [provider]  QUEtiapine (SEROQUEL) 200 MG tablet TAKE 1 TABLET BY MOUTH AT BEDTIME 06/18/22   Cottle, Billey Co., MD  sildenafil (VIAGRA) 100 MG tablet Take 1 tablet (100 mg total) by mouth daily as needed for erectile dysfunction. 12/30/21   Billey Co, MD  tamsulosin (FLOMAX) 0.4 MG CAPS capsule Take 1 capsule (0.4 mg total) by mouth daily. 12/30/21   Billey Co, MD      Allergies     Meloxicam, Nsaids, Ibuprofen, Prednisone, and Wellbutrin [bupropion]    Review of Systems   Review of Systems Review of systems Negative for f/c, CP, SOB, N/V/D.  A 10 point review of systems was performed and is negative unless otherwise reported in HPI.  Physical Exam Updated Vital Signs BP 135/84 (BP Location: Right Arm)   Pulse 96   Temp 98.2 F (36.8 C)   Resp 18   Ht 5\' 9"  (1.753 m)   Wt 83.5 kg   SpO2 99%   BMI 27.17 kg/m  Physical Exam General: Normal appearing male, lying in bed.  HEENT: PERRLA, EOMI, no nystagmus, Sclera anicteric, dry mucous membranes, trachea midline.  Cardiology: RRR, no murmurs/rubs/gallops. BL radial and DP pulses equal bilaterally.  Resp: Normal respiratory rate and effort. CTAB, no wheezes, rhonchi, crackles.  Abd: Soft, non-tender, non-distended. No rebound tenderness or guarding.  GU: Deferred. MSK: No peripheral edema or signs of trauma. Extremities without deformity or TTP. No cyanosis or clubbing. Skin: warm, dry.  Neuro: A&Ox4, CNs II-XII grossly intact. MAEs. Sensation grossly intact. Mild resting tremor in bilateral feet/legs.  Psych: Normal mood and affect.   ED Results / Procedures / Treatments   Labs (all labs ordered are listed, but only abnormal results are displayed) Labs Reviewed  CBC WITH DIFFERENTIAL/PLATELET - Abnormal; Notable for the following components:      Result Value   RBC 3.69 (*)    Hemoglobin 11.6 (*)    HCT 35.8 (*)    All other components within normal limits  COMPREHENSIVE METABOLIC PANEL - Abnormal; Notable for the following components:   CO2 21 (*)    BUN 26 (*)    Creatinine, Ser 2.35 (*)    Alkaline Phosphatase 25 (*)    GFR, Estimated 31 (*)    All other components within normal limits  RAPID URINE DRUG SCREEN, HOSP PERFORMED - Abnormal; Notable for the following components:   Benzodiazepines POSITIVE (*)    All other components within normal limits  URINALYSIS, ROUTINE W REFLEX MICROSCOPIC -  Abnormal; Notable for the following components:   Color, Urine STRAW (*)    All other components within normal limits  TSH - Abnormal; Notable for the following components:   TSH 0.060 (*)    All other components within normal limits  MAGNESIUM  LITHIUM LEVEL  ETHANOL  T4, FREE  BASIC METABOLIC PANEL  LITHIUM LEVEL    EKG EKG Interpretation  Date/Time:  Sunday August 01 2022 19:53:22 EDT Ventricular Rate:  98 PR Interval:  132 QRS Duration: 88 QT Interval:  328 QTC Calculation: 421 R Axis:   19 Text Interpretation: Sinus rhythm Baseline wander V1 Confirmed by Cindee Lame [id number]) on 08/01/2022 8:04:13 PM  Radiology CT Head Wo Contrast  Result Date: 08/01/2022 CLINICAL DATA:  Altered mental status EXAM: CT HEAD WITHOUT  CONTRAST TECHNIQUE: Contiguous axial images were obtained from the base of the skull through the vertex without intravenous contrast. RADIATION DOSE REDUCTION: This exam was performed according to the departmental dose-optimization program which includes automated exposure control, adjustment of the mA and/or kV according to patient size and/or use of iterative reconstruction technique. COMPARISON:  CT Head 08/21/16 FINDINGS: Brain: No evidence of acute infarction, hemorrhage, hydrocephalus, extra-axial collection or mass lesion/mass effect. Chronic left cerebellar infarct. Sequela of mild chronic microvascular ischemic change. Vascular: No hyperdense vessel or unexpected calcification. Skull: Normal. Negative for fracture or focal lesion. Sinuses/Orbits: No middle ear or mastoid effusion. Pansinus mucosal thickening with frothy secretions in the right sphenoid sinus, which can be seen in the setting of acute sinusitis. Other: None. IMPRESSION: 1. No acute intracranial abnormality. 2. Pansinus mucosal thickening with frothy secretions in the right sphenoid sinus, which can be seen in the setting of acute sinusitis. Electronically Signed   By: Marin Roberts M.D.   On:  08/01/2022 19:37    Procedures Procedures  {Document cardiac monitor, telemetry assessment procedure when appropriate:1}  Medications Ordered in ED Medications  OLANZapine (ZYPREXA) tablet 20 mg (20 mg Oral Given 08/01/22 2218)  levothyroxine (SYNTHROID) tablet 75 mcg (has no administration in time range)  docusate sodium (COLACE) capsule 100 mg (100 mg Oral Given 08/01/22 2218)  polyethylene glycol (MIRALAX / GLYCOLAX) packet 17 g (17 g Oral Given 08/01/22 2218)  memantine (NAMENDA) tablet 10 mg (has no administration in time range)  QUEtiapine (SEROQUEL) tablet 200 mg (200 mg Oral Given 08/01/22 2220)  LORazepam (ATIVAN) tablet 0.5 mg (0.5 mg Oral Given 08/01/22 1846)  lactated ringers bolus 1,000 mL (1,000 mLs Intravenous New Bag/Given 08/01/22 2034)    ED Course/ Medical Decision Making/ A&P                          Medical Decision Making Amount and/or Complexity of Data Reviewed Labs: ordered. Decision-making details documented in ED Course. Radiology: ordered. Decision-making details documented in ED Course.  Risk OTC drugs. Prescription drug management.    This patient presents to the ED for concern of ***, this involves an extensive number of treatment options, and is a complaint that carries with it a high risk of complications and morbidity.  I considered the following differential and admission for this acute, potentially life threatening condition.   MDM:    Patient presents with chronic tremors, ***   Patient is here voluntarily and relays that he would be admitted if that's what we think he needs.  He has no active SI plan at this time, he also relates that he has been dealing with SI very chronically.  I do not believe the patient requires an acute IVC.  If patient decides he wants to leave we can reevaluate at that time.   Clinical Course as of 08/01/22 2308  Nancy Fetter Aug 01, 2022  1803 Benzodiazepines(!): POSITIVE Takes ativan daily [HN]  1803 Urinalysis, Routine  w reflex microscopic -Urine, Clean Catch(!) Neg [HN]  1944 Lithium: 1.13 therapeutic [HN]  1945 CBC with Differential(!) Unremarkable, stable anemia [HN]  1945 Creatinine(!): 2.35 Patient's baseline Cr is 1.5-1.6. Patient appears to have had large AKI on labs three days ago at 4.11, now decreasing to 2.35. This decrease in his renal function could have accounted for the increase in his lithium level. Lithium level is now back to therapeutic. He has no GI symptoms, no hypernatremia. TSH level is mildly low,  not high. He is not drowsy on my exam, and he is alert/oriented.  [HN]  1949 CT Head Wo Contrast 1. No acute intracranial abnormality. 2. Pansinus mucosal thickening with frothy secretions in the right sphenoid sinus, which can be seen in the setting of acute sinusitis.   [HN]  2253 Patient is medically cleared. Will redraw BMP and lithium level in the AM since patient will likely be here but his Cr is downtrending and Li level is normal at this time.  [HN]  2254 Pending TTS [HN]    Clinical Course User Index [HN] Audley Hose, MD    Labs: I Ordered, and personally interpreted labs.  The pertinent results include:  those listed above  Imaging Studies ordered: I ordered imaging studies including CTH I independently visualized and interpreted imaging. I agree with the radiologist interpretation  Additional history obtained from brother at bedside.    Reevaluation: After the interventions noted above, I reevaluated the patient and found that they have :improved  Social Determinants of Health: Patient lives independently   Disposition:  pending TTS consult  Co morbidities that complicate the patient evaluation  Past Medical History:  Diagnosis Date   ADHD (attention deficit hyperactivity disorder)    Anemia    Anxiety    Bipolar disorder (HCC)    BPH (benign prostatic hyperplasia)    Chronic kidney disease, stage 3b (HCC)    DDD (degenerative disc disease), cervical     Depression    Deviated septum    ED (erectile dysfunction)    GERD (gastroesophageal reflux disease)    Graves disease    History of hiatal hernia    Hypertension    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    Hypothyroidism    Pneumonia    PONV (postoperative nausea and vomiting)      Medicines Meds ordered this encounter  Medications   LORazepam (ATIVAN) tablet 0.5 mg   lactated ringers bolus 1,000 mL   OLANZapine (ZYPREXA) tablet 20 mg   levothyroxine (SYNTHROID) tablet 75 mcg   docusate sodium (COLACE) capsule 100 mg   polyethylene glycol (MIRALAX / GLYCOLAX) packet 17 g   memantine (NAMENDA) tablet 10 mg   QUEtiapine (SEROQUEL) tablet 200 mg    I have reviewed the patients home medicines and have made adjustments as needed  Problem List / ED Course: Problem List Items Addressed This Visit   None Visit Diagnoses     Suicidal ideation    -  Primary   Dehydration       AKI (acute kidney injury) ([place])       Lithium use                {Document critical care time when appropriate:1} {Document review of labs and clinical decision tools ie heart score, Chads2Vasc2 etc:1}  {Document your independent review of radiology images, and any outside records:1} {Document your discussion with family members, caretakers, and with consultants:1} {Document social determinants of health affecting pt's care:1} {Document your decision making why or why not admission, treatments were needed:1}  This note was created using dictation software, which may contain spelling or grammatical errors.

## 2022-08-01 NOTE — BH Assessment (Signed)
Clinician messaged Serafina Royals, RN and Sheppard Coil. Motsinger, RN: "Hey. It's Trey with TTS. Is the pt able to engage in the assessment, if so the pt will need to be placed in a private room. Is the pt under IVC? Also is the pt medically cleared?    Clinician awaiting response.   Vertell Novak, [place], [facility], [facility] Triage Specialist [phone]

## 2022-08-01 NOTE — ED Notes (Signed)
TTS assessment completed. Lytle Michaels with TTS speaking with mom and dad with patient's permission via video chat at this time.

## 2022-08-01 NOTE — ED Triage Notes (Signed)
Pt states that for the last week he's been having increasing confusion, shakiness, and suicidal thoughts. Pt does not have a plan. No HI, AVH. Pt states that he is on Lithium for Bipolar Disorder and has recently been checked and his level was high at 1.4. Pt A+Ox4 on arrival.

## 2022-08-02 ENCOUNTER — Encounter (HOSPITAL_COMMUNITY): Payer: Self-pay

## 2022-08-02 ENCOUNTER — Telehealth: Payer: Self-pay | Admitting: Psychiatry

## 2022-08-02 DIAGNOSIS — T56891A Toxic effect of other metals, accidental (unintentional), initial encounter: Secondary | ICD-10-CM | POA: Insufficient documentation

## 2022-08-02 DIAGNOSIS — F314 Bipolar disorder, current episode depressed, severe, without psychotic features: Secondary | ICD-10-CM | POA: Diagnosis not present

## 2022-08-02 LAB — BASIC METABOLIC PANEL
Anion gap: 7 (ref 5–15)
BUN: 23 mg/dL — ABNORMAL HIGH (ref 6–20)
CO2: 21 mmol/L — ABNORMAL LOW (ref 22–32)
Calcium: 9.8 mg/dL (ref 8.9–10.3)
Chloride: 109 mmol/L (ref 98–111)
Creatinine, Ser: 2.08 mg/dL — ABNORMAL HIGH (ref 0.61–1.24)
GFR, Estimated: 36 mL/min — ABNORMAL LOW (ref >60)
Glucose, Bld: 97 mg/dL (ref 70–99)
Potassium: 4.6 mmol/L (ref 3.5–5.1)
Sodium: 137 mmol/L (ref 135–145)

## 2022-08-02 LAB — LITHIUM LEVEL: Lithium Lvl: 1.04 mmol/L (ref 0.60–1.20)

## 2022-08-02 LAB — T4, FREE: Free T4: 1.09 ng/dL (ref 0.61–1.12)

## 2022-08-02 MED ORDER — PANTOPRAZOLE SODIUM 40 MG PO TBEC
40.0000 mg | DELAYED_RELEASE_TABLET | Freq: Every day | ORAL | Status: DC
  Administered 2022-08-02: 40 mg via ORAL
  Filled 2022-08-02: qty 1

## 2022-08-02 MED ORDER — FLUVOXAMINE MALEATE 50 MG PO TABS: 50.0000 mg | ORAL_TABLET | Freq: Every day | ORAL | Status: DC

## 2022-08-02 MED ORDER — LORAZEPAM 0.5 MG PO TABS: 0.5000 mg | ORAL_TABLET | Freq: Four times a day (QID) | ORAL | Status: DC | PRN

## 2022-08-02 MED ORDER — OLANZAPINE 10 MG PO TABS: 10.0000 mg | ORAL_TABLET | Freq: Every day | ORAL | Status: DC

## 2022-08-02 MED ORDER — LEVOTHYROXINE SODIUM 50 MCG PO TABS: 75.0000 ug | ORAL_TABLET | Freq: Every day | ORAL | Status: DC

## 2022-08-02 MED ORDER — TAMSULOSIN HCL 0.4 MG PO CAPS
0.4000 mg | ORAL_CAPSULE | Freq: Every day | ORAL | Status: DC
  Administered 2022-08-02: 0.4 mg via ORAL
  Filled 2022-08-02: qty 1

## 2022-08-02 MED ORDER — LISINOPRIL 10 MG PO TABS
5.0000 mg | ORAL_TABLET | Freq: Every day | ORAL | Status: DC
  Administered 2022-08-02: 5 mg via ORAL
  Filled 2022-08-02: qty 1

## 2022-08-02 MED ORDER — FLUVOXAMINE MALEATE 50 MG PO TABS
100.0000 mg | ORAL_TABLET | Freq: Every evening | ORAL | Status: DC
  Filled 2022-08-02: qty 2

## 2022-08-02 MED ORDER — FLUVOXAMINE MALEATE 50 MG PO TABS
50.0000 mg | ORAL_TABLET | Freq: Every day | ORAL | Status: DC
  Filled 2022-08-02: qty 1

## 2022-08-02 MED ORDER — LORAZEPAM 0.5 MG PO TABS
0.5000 mg | ORAL_TABLET | Freq: Four times a day (QID) | ORAL | Status: DC
  Administered 2022-08-02: 0.5 mg via ORAL
  Filled 2022-08-02: qty 1

## 2022-08-02 MED ORDER — FLUVOXAMINE MALEATE 50 MG PO TABS: 50.0000 mg | ORAL_TABLET | ORAL | Status: DC

## 2022-08-02 MED ORDER — LITHIUM CARBONATE ER 450 MG PO TBCR: 450.0000 mg | EXTENDED_RELEASE_TABLET | Freq: Every day | ORAL | Status: DC

## 2022-08-02 NOTE — ED Notes (Signed)
Patient has ambulated to the bathroom and back to his room several times throughout the night. Pt attempted to have conversation with myself and Luis Abed on a few occasions, but is unable to get the words he is trying to say out. Pt states this has been happening lately. Pt showing no s/s of physical distress during these episodes, no unilateral deficits noted. Noted that pt takes Namenda, but does not have any memory loss diagnoses listed in chart.

## 2022-08-02 NOTE — ED Notes (Signed)
One bag of belongings transported with pt and placed in [facility] locker 35.

## 2022-08-02 NOTE — Discharge Instructions (Addendum)
Please Decrease Lithium to 300 mg at night until 08/05/22 when you follow up with Dr. Clovis Pu.   Discharge recommendations:  Patient is to take medications as prescribed. Please see information for follow-up appointment with psychiatry and therapy. Please follow up with your primary care provider for all medical related needs.   Therapy: We recommend that patient participate in individual therapy to address mental health concerns.  Medications: The patient or guardian is to contact a medical professional and/or outpatient provider to address any new side effects that develop. The patient or guardian should update outpatient providers of any new medications and/or medication changes.   Atypical antipsychotics: If you are prescribed an atypical antipsychotic, it is recommended that your height, weight, BMI, blood pressure, fasting lipid panel, and fasting blood sugar be monitored by your outpatient providers.  Safety:  The patient should abstain from use of illicit substances/drugs and abuse of any medications. If symptoms worsen or do not continue to improve or if the patient becomes actively suicidal or homicidal then it is recommended that the patient return to the closest hospital emergency department, the [facility], or call 911 for further evaluation and treatment. National Suicide Prevention Lifeline 1-800-SUICIDE or 615-703-9091.  About 988 988 offers 24/7 access to trained crisis counselors who can help people experiencing mental health-related distress. People can call or text 988 or chat 988lifeline.org for themselves or if they are worried about a loved one who may need crisis support.  Crisis Mobile: [facility]:                     708-516-0631 (for crisis response 24 hours a day) [facility] Hotline:                                            445-506-3114    Safety Plan Albert [name] will reach out to [name], call  911 or call mobile crisis, or go to nearest emergency room if condition worsens or if suicidal thoughts become active Patients' will follow up with [facility] for outpatient psychiatric services (therapy/medication management).  The suicide prevention education provided includes the following: Suicide risk factors Suicide prevention and interventions National Suicide Hotline telephone number [facility] assessment telephone number [place] and/or Residential Mobile Crisis Unit telephone number Request made of family/significant other to:  mother Albert [name] weapons (e.g., guns, rifles, knives), all items previously/currently identified as safety concern.   Remove drugs/medications (over the counter, prescriptions, illicit drugs), all items previously/currently identified as a safety concern.

## 2022-08-02 NOTE — ED Notes (Signed)
Discharge pending mom and dad coming from [place] to pick him up.

## 2022-08-02 NOTE — ED Notes (Signed)
Patient exited room and attempted to have conversation with this RN but was once again unable to get across what he was trying to say. Pt able to say some words clearly, while others are incoherent. Provider notified.

## 2022-08-02 NOTE — Telephone Encounter (Signed)
Pt's Mom, Patsy, called on-call service. Pt d/c from hosp earlier today. There is confusion on correct Lithium dose. Discharge summary says 300 mg q hs, but pt's Mom states 450 mg, 1/2 pill as per conversation with Dr. Clovis Pu on 3/30. I instructed to give the latter and will disc w/ him tomorrow. If he wants pt on 300 mg, a Rx for that needs to be sent to [facility]. Patsy would like a call back Tuesday with further instructions.

## 2022-08-02 NOTE — BH Assessment (Signed)
Comprehensive Clinical Assessment (CCA) Note  08/02/2022 Albert [name] [id number]  Disposition Albert Reichert, NP recommends pt to be observed and reassessed by psychiatry. Disposition discussed with Albert [name] and Albert [name] via secure message.   The patient demonstrates the following risk factors for suicide: Chronic risk factors for suicide include: psychiatric disorder of Bipolar 1 Disorder.  . Acute risk factors for suicide include:  Pt has fleeting passive suicidal thoughts . Protective factors for this patient include: positive social support and positive therapeutic relationship. Considering these factors, the overall suicide risk at this point appears to be high. Patient is not appropriate for outpatient follow up.  Albert [name] is a 57 year old male who presents voluntary and unaccompanied to [facility]. Clinician asked the pt, "what brought you to the hospital?" Pt reports, his family brought him here because of his suicidal ideations. Pt reports, wanting to do something one way or the other. Pt reports, wanting to hurt the family member who brought him to the hospital. Pt reports, the family member is aware but he has no intent, plan or means. Pt reports, for the last few weeks he can't talk good because of his dry mouth he believes is a symptom of taking Wegovy. Pt denies, AVH, self-injurious behaviors and acces to weapons.   Pt denies, current substance use. Pt is linked to Albert [name], psychiatrist with [facility]. Pt reports, he wants a therapist. Per mother, years ago the pt went to [facility] for 14 sessions of ECT. Pt reports, previous inpatient admission at [facility].   Pt presents irritable in scrubs, pt reports having trouble speaking due to dry mouth, asked clinician just to ask him questions. Pt's mood was irritable. Pt's affect was congruent. Pt's insight was poor.  Pt's judgement was fair. Pt reports, if discharged he can not contract for safety.   Pt consented for  clinician to speak to his mother, Albert [name], [phone] to obtain additional information. Per mother on Wednesday morning she called the pt he came over and she noticed he seemed confused, he couldn't get words outs. Per mother, she called his psychiatrist but had to message his nurse and received a call back late in the afternoon. Pt's mother reports, the pt needed to get checked out like he did the last time, mother requested pt's lithium level to be checked. Pt's mother reports, a time before the pt's Lithium level rose and the pt became confused. Per mother she did not hear back from staff until Thursday afternoon but the order was put in Wednesday evening. Pt's mother reports, she took the pt to get his level checked just to find out he'll get the results Monday or Tuesday, she called his doctor Thursday evening. Per mother, the pt went into MyChart on Saturday his results were in, she called the on call doctor to have the pt's psychiatrist to call her. Per mother, pt's Lithum level was 1.4 and his psychiatrist recommended he skip his dose last night and had a half a pill today until his appointment next Thursday. Pt's mother reports, the pt is staying with her and his husband (his parents) he's up all night, taking stuff off the wall, not remembering what he's done. Pt's mother reports, the pt  has suicidal and homicidal thoughts off and on. Pt's mother reports, she doesn't. know if she feel safe with the pt being discharged if he goes home alone he could do something or do something at their home.*  Chief Complaint:  Chief Complaint  Patient presents with   Psychiatric Evaluation   Visit Diagnosis: Bipolar 1 Disorder.    CCA Screening, Triage and Referral (STR)  Patient Reported Information How did you hear about [place]? Family/Friend  What Is the Reason for Your Visit/Call Today? Pt reports, for the last few weeks he can't talk good because of his dry mouth he believes is a symptoms of  taking Wegovy. Pt reports, fleeting passive suicidal and homicidal ideations. Pt denies, AVH, self-injurious behaviors and access to weapons.  How Long Has This Been Causing You Problems? 1 wk - 1 month  What Do You Feel Would Help You the Most Today? Medication(s); Treatment for Depression or other mood problem   Have You Recently Had Any Thoughts About Hurting Yourself? Yes  Are You Planning to Commit Suicide/Harm Yourself At This time? No   Flowsheet Row ED from 08/01/2022 in [facility] Emergency Department at [facility] ED from 04/02/2022 in [facility] Emergency Department at [facility] ED from 12/20/2021 in [facility] Emergency Department at [place] High Risk No Risk No Risk       Have you Recently Had Thoughts About Albert [name]? Yes  Are You Planning to Harm Someone at This Time? No  Explanation: Pt denies.   Have You Used Any Alcohol or Drugs in the Past 24 Hours? No  What Did You Use and How Much? Pt denies, current substance use.   Do You Currently Have a Therapist/Psychiatrist? Yes  Name of Therapist/Psychiatrist: Name of Therapist/Psychiatrist: Dr. Cristy [name], psychiatrist with [facility]   Have You Been Recently Discharged From Any Office Practice or Programs? No  Explanation of Discharge From Practice/Program: None.     CCA Screening Triage Referral Assessment Type of Contact: Tele-Assessment  Telemedicine Service Delivery: Telemedicine service delivery: This service was provided via telemedicine using a 2-way, interactive audio and video technology  Is this Initial or Reassessment? Is this Initial or Reassessment?: Initial Assessment  Date Telepsych consult ordered in [facility]:  Date Telepsych consult ordered in [facility]: 08/01/22  Time Telepsych consult ordered in [facility]:  Time Telepsych consult ordered in [facility]: 1950  Location of Assessment: [facility] ED  Provider Location: [facility] Assessment  Services   Collateral Involvement: Crew Ocana, mother, [phone].   Does Patient Have a Stage manager Guardian? No  Legal Guardian Contact Information: Pt is his own guardian.  Copy of Legal Guardianship Form: No - copy requested  Legal Guardian Notified of Arrival: -- (Pt is his own guardian.)  Legal Guardian Notified of Pending Discharge: -- (Pt is his own guardian.)  If Minor and Not Living with Parent(s), Who has Custody? Pt is an adult and his own guardian.  Is CPS involved or ever been involved? Never  Is APS involved or ever been involved? Never   Patient Determined To Be At Risk for Harm To Self or Others Based on Review of Patient Reported Information or Presenting Complaint? Yes, for Self-Harm  Method: No Plan  Availability of Means: No access or NA  Intent: Vague intent or NA  Notification Required: No need or identified person  Additional Information for Danger to Others Potential: -- (None.)  Additional Comments for Danger to Others Potential: None/  Are There Guns or Other Weapons in Your Home? No  Types of Guns/Weapons: Pt denies, access to weapons.  Are These Weapons Safely Secured?                            -- (  Pt denies, access to weapons.)  Who Could Verify You Are Able To Have These Secured: Pt denies, access to weapons.  Do You Have any Outstanding Charges, Pending Court Dates, Parole/Probation? Pt denies, legal involvement  Contacted To Inform of Risk of Harm To Self or Others: Other: Comment (None.)    Does Patient Present under Involuntary Commitment? No    [place] of Residence: [place]   Patient Currently Receiving the Following Services: Medication Management   Determination of Need: Urgent (48 hours)   Options For Referral: Group Home; Medication Management; Inpatient Hospitalization; Outpatient Therapy     CCA Biopsychosocial Patient Reported Schizophrenia/Schizoaffective Diagnosis in Past:  No   Strengths: Pt has a stong support system.   Mental Health Symptoms Depression:   Difficulty Concentrating; Irritability   Duration of Depressive symptoms:  Duration of Depressive Symptoms: N/A   Mania:   None   Anxiety:    Worrying   Psychosis:   None   Duration of Psychotic symptoms:    Trauma:   None   Obsessions:   None   Compulsions:   None   Inattention:   None   Hyperactivity/Impulsivity:   Feeling of restlessness; Fidgets with hands/feet   Oppositional/Defiant Behaviors:   Angry   Emotional Irregularity:   Recurrent suicidal behaviors/gestures/threats   Other Mood/Personality Symptoms:   None.    Mental Status Exam Appearance and self-care  Stature:   Average   Weight:   Average weight   Clothing:   -- (Pt in scrubs.)   Grooming:   Normal   Cosmetic use:   None   Posture/gait:   Normal   Motor activity:   Not Remarkable   Sensorium  Attention:   Normal   Concentration:   Normal   Orientation:   X5   Recall/memory:   Defective in Immediate   Affect and Mood  Affect:  Congruent   Mood:  Irritable   Relating  Eye contact:   Normal   Facial expression:   Responsive   Attitude toward examiner:   Irritable   Thought and Language  Speech flow:  Normal   Thought content:   Appropriate to Mood and Circumstances   Preoccupation:   None   Hallucinations:   None   Organization:   Coherent   [employer] of Knowledge:   Fair   Intelligence:   Average   Abstraction:   Functional   Judgement:   Fair   Art therapist:   Adequate   Insight:   Poor   Decision Making:   Confused   Social Functioning  Social Maturity:  Impulsive   Social Judgement:   Heedless   Stress  Stressors:   Other (Comment) (Pt reports, life in general.)   Coping Ability:   Overwhelmed   Skill Deficits:   None   Supports:   Family     Religion: Religion/Spirituality Are You A  Religious Person?: Yes What is Your Religious Affiliation?: Christian How Might This Affect Treatment?: None.  Leisure/Recreation: Leisure / Recreation Do You Have Hobbies?: Yes Leisure and Hobbies: Merchandiser, retail.  Exercise/Diet: Exercise/Diet Do You Exercise?: No Have You Gained or Lost A Significant Amount of Weight in the Past Six Months?: No Do You Follow a Special Diet?: No Do You Have Any Trouble Sleeping?: No   CCA Employment/Education Employment/Work Situation: Employment / Work Situation Employment Situation: On disability Why is Patient on Disability: Bipolar 1 Disorder. How Long has Patient Been on Disability: 2013. Patient's Job  has Been Impacted by Current Illness: No Has Patient ever Been in the [place]?: No  Education: Education Is Patient Currently Attending School?: No Last Grade Completed: 12 Did You Attend College?: Yes What Type of College Degree Do you Have?: Pt reports, he went to the [employer] and [employer]. Did You Have An Individualized Education Program (IIEP): No Did You Have Any Difficulty At School?: No Patient's Education Has Been Impacted by Current Illness: No   CCA Family/Childhood History Family and Relationship History: Family history Marital status: Single Does patient have children?: No  Childhood History:  Childhood History By whom was/is the patient raised?: Both parents Did patient suffer any verbal/emotional/physical/sexual abuse as a child?: No Did patient suffer from severe childhood neglect?: No Has patient ever been sexually abused/assaulted/raped as an adolescent or adult?: No Was the patient ever a victim of a crime or a disaster?: No Witnessed domestic violence?: No Has patient been affected by domestic violence as an adult?: No       CCA Substance Use Alcohol/Drug Use: Alcohol / Drug Use Pain Medications: See MAR Prescriptions: See MAR Over the Counter: See MAR History of alcohol / drug use?: No  history of alcohol / drug abuse Longest period of sobriety (when/how long): 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 Negative Consequences of Use:  (Pt denies, current substance use.) Withdrawal Symptoms: None    ASAM's:  Six Dimensions of Multidimensional Assessment  Dimension 1:  Acute Intoxication and/or Withdrawal Potential:   Dimension 1:  Description of individual's past and current experiences of substance use and withdrawal: Pt denies, current substance use.  Dimension 2:  Biomedical Conditions and Complications:   Dimension 2:  Description of patient's biomedical conditions and  complications: Pt denies, current substance use.  Dimension 3:  Emotional, Behavioral, or Cognitive Conditions and Complications:  Dimension 3:  Description of emotional, behavioral, or cognitive conditions and complications: Pt denies, current substance use.  Dimension 4:  Readiness to Change:  Dimension 4:  Description of Readiness to Change criteria: Pt denies, current substance use.  Dimension 5:  Relapse, Continued use, or Continued Problem Potential:  Dimension 5:  Relapse, continued use, or continued problem potential critiera description: Pt denies, current substance use.  Dimension 6:  Recovery/Living Environment:  Dimension 6:  Recovery/Iiving environment criteria description: Pt denies, current substance use.  ASAM Severity Score: ASAM's Severity Rating Score: 0  ASAM Recommended Level of Treatment: ASAM Recommended Level of Treatment:  (Pt denies, current substance use.)   Substance use Disorder (SUD) Substance Use Disorder (SUD)  Checklist Symptoms of Substance Use:  (Pt denies, current substance use.)  Recommendations for Services/Supports/Treatments: Recommendations for Services/Supports/Treatments Recommendations For Services/Supports/Treatments: Other (Comment) (Pt to be observed and reassessed by psychiatry.)  Discharge Disposition: Discharge Disposition Medical Exam  completed: Yes  DSM5 Diagnoses: Patient Active Problem List   Diagnosis Date Noted   S/P repair of paraesophageal hernia 10/23/2020   Secondary hyperparathyroidism of renal origin 07/03/2019   Anemia in chronic kidney disease 05/29/2019   Benign hypertensive kidney disease with chronic kidney disease 05/29/2019   Stage 3 chronic kidney disease 05/29/2019   BPH associated with nocturia 04/02/2019   Essential hypertriglyceridemia 04/02/2019   Gastroesophageal reflux disease without esophagitis 04/02/2019   Severe depressed bipolar I disorder without psychotic features 02/10/2018   Need for prophylactic chemotherapy 02/10/2018   GAD (generalized anxiety disorder) 02/10/2018     Referrals to Alternative Service(s): Referred to Alternative Service(s):   Place:  Date:   Time:    Referred to Alternative Service(s):   Place:   Date:   Time:    Referred to Alternative Service(s):   Place:   Date:   Time:    Referred to Alternative Service(s):   Place:   Date:   Time:     Vertell Novak, [facility] Comprehensive Clinical Assessment (CCA) Screening, Triage and Referral Note  08/02/2022 Albert [name] [id number]  Chief Complaint:  Chief Complaint  Patient presents with   Psychiatric Evaluation   Visit Diagnosis:   Patient Reported Information How did you hear about [place]? Family/Friend  What Is the Reason for Your Visit/Call Today? Pt reports, for the last few weeks he can't talk good because of his dry mouth he believes is a symptoms of taking Wegovy. Pt reports, fleeting passive suicidal and homicidal ideations. Pt denies, AVH, self-injurious behaviors and access to weapons.  How Long Has This Been Causing You Problems? 1 wk - 1 month  What Do You Feel Would Help You the Most Today? Medication(s); Treatment for Depression or other mood problem   Have You Recently Had Any Thoughts About Hurting Yourself? Yes  Are You Planning to Commit Suicide/Harm Yourself At This time? No   Have you  Recently Had Thoughts About [place]? Yes  Are You Planning to Harm Someone at This Time? No  Explanation: Pt denies.   Have You Used Any Alcohol or Drugs in the Past 24 Hours? No  How Long Ago Did You Use Drugs or Alcohol? Pt reports he's been sober for 22 years.  What Did You Use and How Much? Pt denies, current substance use.   Do You Currently Have a Therapist/Psychiatrist? Yes  Name of Therapist/Psychiatrist: Dr. Cristy [name], psychiatrist with [facility]   Have You Been Recently Discharged From Any Office Practice or Programs? No  Explanation of Discharge From Practice/Program: None.    CCA Screening Triage Referral Assessment Type of Contact: Tele-Assessment  Telemedicine Service Delivery: Telemedicine service delivery: This service was provided via telemedicine using a 2-way, interactive audio and video technology  Is this Initial or Reassessment? Is this Initial or Reassessment?: Initial Assessment  Date Telepsych consult ordered in [facility]:  Date Telepsych consult ordered in [facility]: 08/01/22  Time Telepsych consult ordered in [facility]:  Time Telepsych consult ordered in [facility]: 1950  Location of Assessment: [facility] ED  Provider Location: [facility] Assessment Services    Collateral Involvement: Deklyn Applewhite, mother, [phone].   Does Patient Have a Stage manager Guardian? No. Name and Contact of Legal Guardian: Pt is his own guardian.  If Minor and Not Living with Parent(s), Who has Custody? Pt is an adult and his own guardian.  Is CPS involved or ever been involved? Never  Is APS involved or ever been involved? Never   Patient Determined To Be At Risk for Harm To Self or Others Based on Review of Patient Reported Information or Presenting Complaint? Yes, for Self-Harm  Method: No Plan  Availability of Means: No access or NA  Intent: Vague intent or NA  Notification Required: No need or identified person  Additional Information for Danger to Others  Potential: -- (None.)  Additional Comments for Danger to Others Potential: None/  Are There Guns or Other Weapons in Your Home? No  Types of Guns/Weapons: Pt denies, access to weapons.  Are These Weapons Safely Secured?                            -- (  Pt denies, access to weapons.)  Who Could Verify You Are Able To Have These Secured: Pt denies, access to weapons.  Do You Have any Outstanding Charges, Pending Court Dates, Parole/Probation? Pt denies, legal involvement  Contacted To Inform of Risk of Harm To Self or Others: Other: Comment (None.)   Does Patient Present under Involuntary Commitment? No    [place] of Residence: [place]   Patient Currently Receiving the Following Services: Medication Management   Determination of Need: Urgent (48 hours)   Options For Referral: Group Home; Medication Management; Inpatient Hospitalization; Outpatient Therapy   Discharge Disposition:  Discharge Disposition Medical Exam completed: Yes  Vertell Novak, [place], [place], [facility], [place] Triage Specialist [phone]

## 2022-08-02 NOTE — Telephone Encounter (Signed)
Spoke with mom today at 11:51. She said she had been unable to get any information on patient this morning. While I was on the phone with her she got another call and thought it might be the hospital and ended our call.

## 2022-08-02 NOTE — Telephone Encounter (Signed)
Taking lihtium 450 mg tablets he has is fine until seen in a couple of days

## 2022-08-02 NOTE — ED Notes (Addendum)
Per Lytle Michaels with TTS: Quintella Reichert, NP recommends pt to be observed and reassessed by psychiatry.

## 2022-08-02 NOTE — ED Provider Notes (Signed)
Emergency Medicine Observation Re-evaluation Note  Albert [name] is a 57 y.o. male, seen on rounds today.  Pt initially presented to the ED for complaints of Psychiatric Evaluation Currently, the patient is awaiting psychiatric disposition.  Physical Exam  BP (!) 113/99 (BP Location: Left Arm)   Pulse 96   Temp 98.1 F (36.7 C) (Oral)   Resp 18   Ht 5\' 9"  (1.753 m)   Wt 83.5 kg   SpO2 98%   BMI 27.17 kg/m  Physical Exam Alert in no acute distress  ED Course / MDM  EKG:EKG Interpretation  Date/Time:  Sunday August 01 2022 19:53:22 EDT Ventricular Rate:  98 PR Interval:  132 QRS Duration: 88 QT Interval:  328 QTC Calculation: 421 R Axis:   19 Text Interpretation: Sinus rhythm Baseline wander V1 Confirmed by Cindee Lame [id number]) on 08/01/2022 8:04:13 PM  I have reviewed the labs performed to date as well as medications administered while in observation.  Recent changes in the last 24 hours include none.  Plan  Current plan is for await psychiatric disposition.    Milton Ferguson, MD 08/02/22 [phone]

## 2022-08-02 NOTE — Consult Note (Signed)
[facility] ED ASSESSMENT   Reason for Consult:  SI Referring Physician:  Dr. Mayra Neer Patient Identification: Albert [name] MRN:  [id number] ED Chief Complaint: Severe depressed bipolar I disorder without psychotic features  Diagnosis:  Principal Problem:   Severe depressed bipolar I disorder without psychotic features Active Problems:   GAD (generalized anxiety disorder)   Lithium toxicity   ED Assessment Time Calculation: Start Time: 0945 Stop Time: 1025 Total Time in Minutes (Assessment Completion): 40   HPI:  Pt states that for the last week he's been having increasing confusion, shakiness, and suicidal thoughts. Pt does not have a plan. No HI, AVH. Pt states that he is on Lithium for Bipolar Disorder and has recently been checked and his level was high at 1.4. Pt A+Ox4 on arrival.    Subjective:  Albert [name], 57 y.o., male patient seen face to face by this provider, consulted with Dr. Dwyane Dee; and chart reviewed on 08/02/22.  On evaluation Albert [name] reports that he is doing okay, he states that he lives by himself, he does not work he is on disability.  He states that he sees Dr. Clovis Pu at [place], he is his psychiatrist.  Patient states that he has SI thoughts, no plan, not currently having any, denies HI/AVH.  Patient states that he has been increasingly confused and shaky, and feels that he should not be going back home alone.  Patient states his appetite and sleep are poor due to anxiety and confusion.  Patient denies being on any drugs or alcohol, UDS is positive for benzodiazepines, which patient takes Ativan as needed for anxiety, BAL less than 10.  Patient states he has been to an inpatient psychiatric facility before about 2 years ago.  Patient says that he is compliant with medications. Patient able to contract for safety.  During evaluation Albert [name] is sitting on his bed in no acute distress.  He is alert, oriented x 3, calm, cooperative and attentive.  His mood is anxious with congruent  affect.  He has normal speech, and behavior.  Objectively there is no evidence of psychosis/mania or delusional thinking.  Patient is able to converse coherently, no distractibility, or pre-occupation.  He currently denies suicidal/self-harm/homicidal ideation, psychosis, and paranoia.  Patient lithium level at this time is 1.04 within normal range.  Patient does not appear to be having any outboard signs of lithium toxicity, is anxious about going home, gives provider permission to speak with [name].   Spoke with patient's provider Dr. Clovis Pu, at [facility] psychiatric, he stated that he will be seeing patient on Thursday, April 4 for follow-up, to decrease lithium to 300 mg until he sees patient, in which she will discontinue the lithium.  Spoke with patient's mother Albert [name], informed her of medication changes, to change patient's lithium to 300 mg at night p.o., follow-up with Dr. Clovis Pu at [facility] psychiatric on Thursday, August 05, 2022.  She states that patient was confused, was not able to complete errands, provider explained to her that patient's lithium level was increased, discussed signs of lithium toxicity which include confusion, and shakiness, anxiety.  Encouraged mother to have patient drink as much water to hydrate as possible, and asked her to assist him in administering his medications.  She states that patient is able to stay with her and her husband, patient's brother also states in the home with him, they will provide support for patient, they will come and pick patient up once he is  discharged.  Albert [name], is very knowledgeable about patient's condition and medication, and is willing to do what she has to do to help her son get the help he needs.  Provider recommended [facility] in [place] for patient to follow-up with a mental health therapist.  Past Psychiatric History: Bipolar  Risk to Self or Others: Is the patient at risk to self? Patient denies  Has the patient  been a risk to self in the past 6 months? Patient denies  Has the patient been a risk to self within the distant past? Patient denies  Is the patient a risk to others? Patient denies  Has the patient been a risk to others in the past 6 months? Patient denies  Has the patient been a risk to others within the distant past? Patient denies   [place]:  [facility] from 08/01/2022 in [facility] Emergency Department at [facility] ED from 04/02/2022 in [facility] Emergency Department at [facility] ED from 12/20/2021 in [facility] Emergency Department at [place] High Risk No Risk No Risk       AIMS:  , , ,  ,   ASAM: ASAM Multidimensional Assessment Summary Dimension 1:  Description of individual's past and current experiences of substance use and withdrawal: Pt denies, current substance use. DImension 1:  Acute Intoxication and/or Withdrawal Potential Severity Rating: None Dimension 2:  Description of patient's biomedical conditions and  complications: Pt denies, current substance use. Dimension 2:  Biomedical Conditions and Complications Severity Rating: None Dimension 3:  Description of emotional, behavioral, or cognitive conditions and complications: Pt denies, current substance use. Dimension 3:  Emotional, behavioral or cognitive (EBC) conditions and complications severity rating: None Dimension 4:  Description of Readiness to Change criteria: Pt denies, current substance use. Dimension 4:  Readiness to Change Severity Rating: None Dimension 5:  Relapse, continued use, or continued problem potential critiera description: Pt denies, current substance use. Dimension 5:  Relapse, continued use, or continued problem potential severity rating: None Dimension 6:  Recovery/Iiving environment criteria description: Pt denies, current substance use. 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    Hypothyroidism    Pneumonia    PONV (postoperative nausea and vomiting)     Past Surgical History:  Procedure Laterality Date   BACK SURGERY     lumbar disc   colonoscopyx2 N/A  2003, 2016   ESOPHAGOGASTRODUODENOSCOPY     1998, 2002, 2003, 2016   ESOPHAGOGASTRODUODENOSCOPY (EGD) WITH PROPOFOL N/A 09/12/2020   Procedure: ESOPHAGOGASTRODUODENOSCOPY (EGD) WITH PROPOFOL;  Surgeon: Robert Bellow, MD;  Location: [facility] ENDOSCOPY;  Service: Endoscopy;  Laterality: N/A;  1ST CASE   EYE SURGERY  2006   strabismus surgery   HERNIA REPAIR     Hiatal Hernia   INSERTION OF MESH Bilateral 03/03/2021   Procedure: INSERTION OF MESH;  Surgeon: Jules Husbands, MD;  Location: [facility] ORS;  Service: General;  Laterality: Bilateral;   NASAL SEPTUM SURGERY     NM I- 131 THERAPY FOR ABLATION ([place] HX)  04/11/2015   thyroid   SPINE SURGERY     XI ROBOTIC ASSISTED PARAESOPHAGEAL HERNIA REPAIR N/A 10/23/2020   Procedure: XI ROBOTIC  ASSISTED PARAESOPHAGEAL HERNIA REPAIR WITH MESH AND INCISIONAL HERNIA REPAIR WITH MESH;  Surgeon: Jules Husbands, MD;  Location: [facility] ORS;  Service: General;  Laterality: N/A;   Family History:  Family History  Problem Relation Age of Onset   Prostate cancer Neg Hx    Bladder Cancer Neg Hx    Kidney cancer Neg Hx    Social History:  Social History   Substance and Sexual Activity  Alcohol Use Not Currently     Social History   Substance and Sexual Activity  Drug Use Never    Social History   Socioeconomic History   Marital status: Single    Spouse name: Not on file   Number of children: Not on file   Years of education: Not on file   Highest education level: Not on file  Occupational History   Not on file  Tobacco Use   Smoking status: Former    Types: Cigarettes    Passive exposure: Past   Smokeless tobacco: Former    Types: Nurse, children's Use: Never used  Substance and Sexual Activity   Alcohol use: Not Currently   Drug use: Never   Sexual activity: Yes  Other Topics Concern   Not on file  Social History Narrative   Not on file   Social Determinants of Health   Financial Resource Strain: Not on file  Food Insecurity: Not on file  Transportation Needs: Not on file  Physical Activity: Not on file  Stress: Not on file  Social Connections: Not on file      Allergies:   Allergies  Allergen Reactions   Meloxicam Other (See Comments)    Other reaction(s): Dizziness   Nsaids     Other reaction(s): Hallucination, Other (See Comments)   Ibuprofen     Can not take because taking lithium   Prednisone     Can't sleep    Wellbutrin [Bupropion]     Suicide thoughts    Labs:  Results for orders placed or performed during the hospital encounter of 08/01/22 (from the past 48 hour(s))  Rapid urine drug screen (hospital performed)     Status: Abnormal   Collection Time: 08/01/22  5:29 PM  Result Value Ref Range   Opiates NONE DETECTED NONE  DETECTED   Cocaine NONE DETECTED NONE DETECTED   Benzodiazepines POSITIVE (A) NONE DETECTED   Amphetamines NONE DETECTED NONE DETECTED   Tetrahydrocannabinol NONE DETECTED NONE DETECTED   Barbiturates NONE DETECTED NONE DETECTED    Comment: (NOTE) DRUG SCREEN FOR MEDICAL PURPOSES ONLY.  IF CONFIRMATION IS NEEDED FOR ANY PURPOSE, NOTIFY LAB WITHIN 5 DAYS.  LOWEST DETECTABLE LIMITS FOR  URINE DRUG SCREEN Drug Class                     Cutoff (ng/mL) Amphetamine and metabolites    1000 Barbiturate and metabolites    200 Benzodiazepine                 200 Opiates and metabolites        300 Cocaine and metabolites        300 THC                            50 Performed at [facility], [place] [address] [place], [place] [address]   Urinalysis, Routine w reflex microscopic -Urine, Clean Catch     Status: Abnormal   Collection Time: 08/01/22  5:29 PM  Result Value Ref Range   Color, Urine STRAW (A) YELLOW   APPearance CLEAR CLEAR   Specific Gravity, Urine 1.006 1.005 - 1.030   pH 6.0 5.0 - 8.0   Glucose, UA NEGATIVE NEGATIVE mg/dL   Hgb urine dipstick NEGATIVE NEGATIVE   Bilirubin Urine NEGATIVE NEGATIVE   Ketones, ur NEGATIVE NEGATIVE mg/dL   Protein, ur NEGATIVE NEGATIVE mg/dL   Nitrite NEGATIVE NEGATIVE   Leukocytes,Ua NEGATIVE NEGATIVE    Comment: Performed at [place] [address], [place], [place] [address]  CBC with Differential     Status: Abnormal   Collection Time: 08/01/22  5:50 PM  Result Value Ref Range   WBC 6.3 4.0 - 10.5 K/uL   RBC 3.69 (L) 4.22 - 5.81 MIL/uL   Hemoglobin 11.6 (L) 13.0 - 17.0 g/dL   HCT 35.8 (L) 39.0 - 52.0 %   MCV 97.0 80.0 - 100.0 fL   MCH 31.4 26.0 - 34.0 pg   MCHC 32.4 30.0 - 36.0 g/dL   RDW 12.7 11.5 - 15.5 %   Platelets 376 150 - 400 K/uL   nRBC 0.0 0.0 - 0.2 %   Neutrophils Relative % 58 %   Neutro Abs 3.6 1.7 - 7.7 K/uL   Lymphocytes Relative 27 %   Lymphs Abs 1.7 0.7 - 4.0 K/uL    Monocytes Relative 8 %   Monocytes Absolute 0.5 0.1 - 1.0 K/uL   Eosinophils Relative 6 %   Eosinophils Absolute 0.4 0.0 - 0.5 K/uL   Basophils Relative 1 %   Basophils Absolute 0.1 0.0 - 0.1 K/uL   Immature Granulocytes 0 %   Abs Immature Granulocytes 0.02 0.00 - 0.07 K/uL    Comment: Performed at [facility], [place] [address], [place], [place] [address]  Comprehensive metabolic panel     Status: Abnormal   Collection Time: 08/01/22  5:50 PM  Result Value Ref Range   Sodium 137 135 - 145 mmol/L   Potassium 4.4 3.5 - 5.1 mmol/L   Chloride 107 98 - 111 mmol/L   CO2 21 (L) 22 - 32 mmol/L   Glucose, Bld 90 70 - 99 mg/dL    Comment: Glucose reference range applies only to samples taken after fasting for at least 8 hours.   BUN 26 (H) 6 - 20 mg/dL   Creatinine, Ser 2.35 (H) 0.61 - 1.24 mg/dL   Calcium 9.8 8.9 - 10.3 mg/dL   Total Protein 7.2 6.5 - 8.1 g/dL   Albumin 4.7 3.5 - 5.0 g/dL   AST 20 15 - 41 U/L   ALT 14 0 - 44  U/L   Alkaline Phosphatase 25 (L) 38 - 126 U/L   Total Bilirubin 0.7 0.3 - 1.2 mg/dL   GFR, Estimated 31 (L) >60 mL/min    Comment: (NOTE) Calculated using the CKD-EPI Creatinine Equation (2021)    Anion gap 9 5 - 15    Comment: Performed at [facility], [place] [address] [place], [place] [address]  Magnesium     Status: None   Collection Time: 08/01/22  5:50 PM  Result Value Ref Range   Magnesium 2.1 1.7 - 2.4 mg/dL    Comment: Performed at [facility], [place] [address] [place], [place] [address]  Lithium level     Status: None   Collection Time: 08/01/22  5:50 PM  Result Value Ref Range   Lithium Lvl 1.13 0.60 - 1.20 mmol/L    Comment: Performed at [facility], [place] [address] [place], [place] [address]  Ethanol     Status: None   Collection Time: 08/01/22  5:50 PM  Result Value Ref Range   Alcohol, Ethyl (B) <10 <10 mg/dL    Comment: (NOTE) Lowest detectable limit for serum  alcohol is 10 mg/dL.  For medical purposes only. Performed at [facility], [place] [address], [place], [place] [address]   TSH     Status: Abnormal   Collection Time: 08/01/22  5:50 PM  Result Value Ref Range   TSH 0.060 (L) 0.350 - 4.500 uIU/mL    Comment: Performed by a 3rd Generation assay with a functional sensitivity of <=0.01 uIU/mL. Performed at [facility], [place] [address], [place], [place] [address]   T4, free     Status: None   Collection Time: 08/01/22  5:50 PM  Result Value Ref Range   Free T4 1.09 0.61 - 1.12 ng/dL    Comment: (NOTE) Biotin ingestion may interfere with free T4 tests. If the results are inconsistent with the TSH level, previous test results, or the clinical presentation, then consider biotin interference. If needed, order repeat testing after stopping biotin. Performed at [facility], [place] [address], [place], [place] [id number]   Basic metabolic panel     Status: Abnormal   Collection Time: 08/02/22  4:34 AM  Result Value Ref Range   Sodium 137 135 - 145 mmol/L   Potassium 4.6 3.5 - 5.1 mmol/L   Chloride 109 98 - 111 mmol/L   CO2 21 (L) 22 - 32 mmol/L   Glucose, Bld 97 70 - 99 mg/dL    Comment: Glucose reference range applies only to samples taken after fasting for at least 8 hours.   BUN 23 (H) 6 - 20 mg/dL   Creatinine, Ser 2.08 (H) 0.61 - 1.24 mg/dL   Calcium 9.8 8.9 - 10.3 mg/dL   GFR, Estimated 36 (L) >60 mL/min    Comment: (NOTE) Calculated using the CKD-EPI Creatinine Equation (2021)    Anion gap 7 5 - 15    Comment: Performed at [facility], [place] [address], [place], [place] [address]  Lithium level     Status: None   Collection Time: 08/02/22  4:34 AM  Result Value Ref Range   Lithium Lvl 1.04 0.60 - 1.20 mmol/L    Comment: Performed at [facility], [place] [address] [place], [place] [address]    Current Facility-Administered Medications  Medication  Dose Route Frequency Provider Last Rate Last Admin   docusate sodium (COLACE) capsule 100 mg  100 mg Oral BID Audley Hose, MD   100 mg at 08/02/22 [id number]   fluvoxaMINE (LUVOX) tablet 100 mg  100 mg Oral QPM Milton Ferguson, MD       Derrill Memo ON 08/03/2022] fluvoxaMINE (LUVOX) tablet 50 mg  50 mg Oral Daily Milton Ferguson, MD       levothyroxine (SYNTHROID) tablet 75 mcg  75 mcg Oral Q0600 Audley Hose, MD   75 mcg at 08/02/22 0605   [START ON 08/03/2022] levothyroxine (SYNTHROID) tablet 75 mcg  75 mcg Oral QAC breakfast Milton Ferguson, MD       lisinopril (ZESTRIL) tablet 5 mg  5 mg Oral Daily Milton Ferguson, MD   5 mg at 08/02/22 1120   lithium carbonate (ESKALITH) ER tablet 450 mg  450 mg Oral QHS Milton Ferguson, MD       LORazepam (ATIVAN) tablet 0.5 mg  0.5 mg Oral QID PRN Milton Ferguson, MD       memantine [facility]) tablet 10 mg  10 mg Oral Daily Audley Hose, MD   10 mg at 08/02/22 0925   OLANZapine (ZYPREXA) tablet 10 mg  10 mg Oral QHS Milton Ferguson, MD       OLANZapine (ZYPREXA) tablet 20 mg  20 mg Oral QHS Audley Hose, MD   20 mg at 08/01/22 2218   pantoprazole (PROTONIX) EC tablet 40 mg  40 mg Oral Daily Milton Ferguson, MD   40 mg at 08/02/22 1113   polyethylene glycol (MIRALAX / GLYCOLAX) packet 17 g  17 g Oral Daily Audley Hose, MD   17 g at 08/02/22 0925   QUEtiapine (SEROQUEL) tablet 200 mg  200 mg Oral QHS Audley Hose, MD   200 mg at 08/01/22 2220   tamsulosin (FLOMAX) capsule 0.4 mg  0.4 mg Oral Daily Milton Ferguson, MD   0.4 mg at 08/02/22 1139   Current Outpatient Medications  Medication Sig Dispense Refill   acetaminophen (TYLENOL) 500 MG tablet Take 1,000 mg by mouth every 6 (six) hours as needed for moderate pain.     bisacodyl (DULCOLAX) 10 MG suppository Place 1 suppository (10 mg total) rectally as needed for moderate constipation. 12 suppository 0   esomeprazole (NEXIUM) 40 MG capsule Take 40 mg by mouth daily.     fenofibrate (TRICOR) 145 MG tablet Take  145 mg by mouth daily.     fluticasone (FLONASE) 50 MCG/ACT nasal spray Place 1 spray into both nostrils daily as needed for allergies or rhinitis.     fluvoxaMINE (LUVOX) 50 MG tablet 1 in the AM and 2 tablets in PM (Patient taking differently: Take 50-100 mg by mouth See admin instructions. Take 50 mg (1 tablet) by mouth every morning, and 100 mg (2 tablets) every evening.) 90 tablet 1   hydrocortisone (ANUSOL-HC) 2.5 % rectal cream Place 1 application rectally 3 (three) times daily as needed for hemorrhoids.     levothyroxine (SYNTHROID, LEVOTHROID) 75 MCG tablet Take 75 mcg by mouth daily before breakfast.     lisinopril (ZESTRIL) 5 MG tablet Take 5 mg by mouth daily.     lithium carbonate (ESKALITH) 450 MG ER tablet TAKE ONE TABLET BY MOUTH AT BEDTIME 90 tablet 0   LORazepam (ATIVAN) 0.5 MG tablet TAKE ONE TABLET BY MOUTH FOUR TIMES DAILY 120 tablet 1   meclizine (ANTIVERT) 25 MG tablet Take 1 tablet (25 mg total) by mouth 3 (three) times daily as needed for dizziness. 30 tablet 1   OLANZapine (  ZYPREXA) 10 MG tablet TAKE 1 TABLET BY MOUTH AT BEDTIME 90 tablet 0   OLANZapine (ZYPREXA) 20 MG tablet TAKE ONE TABLET BY MOUTH AT BEDTIME 30 tablet 0   polycarbophil (FIBERCON) 625 MG tablet Take 1,250 mg by mouth daily.     QUEtiapine (SEROQUEL) 200 MG tablet TAKE 1 TABLET BY MOUTH AT BEDTIME 90 tablet 0   sildenafil (VIAGRA) 100 MG tablet Take 1 tablet (100 mg total) by mouth daily as needed for erectile dysfunction. 30 tablet 6   tamsulosin (FLOMAX) 0.4 MG CAPS capsule Take 1 capsule (0.4 mg total) by mouth daily. 90 capsule 3   WEGOVY 1 MG/0.5ML SOAJ Inject 1 mg into the skin once a week.      Musculoskeletal: Strength & Muscle Tone: within normal limits Gait & Station: normal Patient leans: N/A   Psychiatric Specialty Exam: Presentation  General Appearance: No data recorded Eye Contact:No data recorded Speech:No data recorded Speech Volume:No data recorded Handedness:No data  recorded  Mood and Affect  Mood:No data recorded Affect:No data recorded  Thought Process  Thought Processes:No data recorded Descriptions of Associations:No data recorded Orientation:No data recorded Thought Content:No data recorded History of Schizophrenia/Schizoaffective disorder:No  Duration of Psychotic Symptoms:No data recorded Hallucinations:No data recorded Ideas of Reference:No data recorded Suicidal Thoughts:No data recorded Homicidal Thoughts:No data recorded  Sensorium  Memory:No data recorded Judgment:No data recorded Insight:No data recorded  Executive Functions  Concentration:No data recorded Attention Span:No data recorded Recall:No data recorded Fund of Knowledge:No data recorded Language:No data recorded  Psychomotor Activity  Psychomotor Activity:No data recorded  Assets  Assets:No data recorded   Sleep  Sleep:No data recorded  Physical Exam: Physical Exam Vitals and nursing note reviewed. Exam conducted with a chaperone present.  Eyes:     Pupils: Pupils are equal, round, and reactive to light.  Pulmonary:     Effort: Pulmonary effort is normal.  Musculoskeletal:        General: Normal range of motion.     Cervical back: Normal range of motion.  Neurological:     Mental Status: He is alert.  Psychiatric:        Mood and Affect: Mood normal.        Behavior: Behavior normal.        Thought Content: Thought content normal.        Judgment: Judgment normal.    Review of Systems  Constitutional: Negative.   HENT: Negative.    Respiratory: Negative.    Musculoskeletal: Negative.   Psychiatric/Behavioral: Negative.     Blood pressure 132/88, pulse 94, temperature 98.8 F (37.1 C), temperature source Oral, resp. rate 18, height 5\' 9"  (1.753 m), weight 83.5 kg, SpO2 98 %. Body mass index is 27.17 kg/m.   Medical Decision Making: Patient is psychiatrically cleared. Patient case review and discussed with Dr. Dwyane Dee, and patient does  not meet inpatient criteria for inpatient psychiatric treatment. At time of discharge, patient denies SI, HI, AVH and is able to contract for safety. He demonstrated no overt evidence of psychosis or mania. Patient will have his Lithium decreased from 450 to 300 mg at night. Patient given resources to behavioral health urgent care and [facility] in [place], [place]. Patient will f/u with [facility] and Dr. Clovis Pu on 08/05/22. Prior to discharge, he verbalized that they understood warning signs, triggers, and symptoms of worsening mental health and how to access emergency mental health care if they felt it was needed. Patient was instructed to call 911 or return to the  emergency room if they experienced any concerning symptoms after discharge. Patient voiced understanding and agreed to the above.  Patient given resources to follow up with behavioral health urgent care for therapy and medication management.  Safety Plan Albert [name] will reach out to [name], call 911 or call mobile crisis, or go to nearest emergency room if condition worsens or if suicidal thoughts become active Patients' will follow up with [facility] for outpatient psychiatric services (therapy/medication management).  The suicide prevention education provided includes the following: Suicide risk factors Suicide prevention and interventions National Suicide Hotline telephone number [facility] assessment telephone number [place] and/or Residential Mobile Crisis Unit telephone number Request made of family/significant other to:  mother [place] weapons (e.g., guns, rifles, knives), all items previously/currently identified as safety concern.   Remove drugs/medications (over the counter, prescriptions, illicit drugs), all items previously/currently identified as a safety concern.      Disposition: Patient does not meet criteria for psychiatric inpatient  admission. Supportive therapy provided about ongoing stressors. Discussed crisis plan, support from social network, calling 911, coming to the Emergency Department, and calling Suicide Hotline.  [place], PMHNP 08/02/2022 2:25 PM

## 2022-08-02 NOTE — Telephone Encounter (Signed)
Albert [name] lvm at 11:19 stating that she has been in contact with [facility] and spoke with BW over the weekend. [name] was admitted to hospital yesterday afternoon and mom would like someone to call her back regarding [name]. Ph: [phone]

## 2022-08-02 NOTE — Telephone Encounter (Signed)
Patsy- mom LVM asking if Ativan taken with night meds could cause any of the side effects he's having? Rx is for 4 daily as needed. Labs taken. Pt very confused and woke up @ 3 am  Don't remember moving things around. Contact # [phone] Patsy. RTC Message left 3/29 8:38 am

## 2022-08-02 NOTE — ED Notes (Signed)
Pt able to have more coherent conversation while drawing blood, but was disoriented to place. Pt stated that he thought he was in [facility]. Pt ambulated with steady gait to the bathroom without assistance. Pt provided with italian ice upon getting back to room. Pt states no further needs at this time.

## 2022-08-02 NOTE — Telephone Encounter (Signed)
Patient in the hospital

## 2022-08-03 ENCOUNTER — Telehealth: Payer: Self-pay

## 2022-08-03 NOTE — Telephone Encounter (Addendum)
Called mom about lithium. She said patient is no better and that all they did at the hospital was labs. She said he is tapering off Luvox and that he had some half tablets in his pillbox, but is not at the half-tablet part of the taper yet. She is unsure how he was taking it and is asking if this might account for his sx. She reports he got up 7 times during the night, but unsure how many times is to use RR or get water. He moves things around and doesn't remember.   Mom is aware that patient has appt Thursday. She said he has never had these behaviors and said it has to be related to his medications.

## 2022-08-04 NOTE — Telephone Encounter (Signed)
I don't see how reducing fluvoxamine could cause anything like this.  That was the only recent med change unless he made another without my awareness (other than the drop in lihtium which also would not cause this).  What dose of fluvoxamine is he taking now?  Will have to wait until his appt tomorrow unless new info comes up.  Have her come to his appt also.

## 2022-08-04 NOTE — Progress Notes (Signed)
Pt went to ER for acute kidney injury and received fluids and lithium dropped to 450 mg daily from 900 mg daily.  Will plan to continue to try to taper but can't do so quickly bc of his SI

## 2022-08-05 ENCOUNTER — Telehealth: Payer: Self-pay | Admitting: Psychiatry

## 2022-08-05 ENCOUNTER — Encounter: Payer: Self-pay | Admitting: Psychiatry

## 2022-08-05 ENCOUNTER — Ambulatory Visit (INDEPENDENT_AMBULATORY_CARE_PROVIDER_SITE_OTHER): Payer: Medicare PPO | Admitting: Psychiatry

## 2022-08-05 DIAGNOSIS — G3184 Mild cognitive impairment, so stated: Secondary | ICD-10-CM

## 2022-08-05 DIAGNOSIS — F5105 Insomnia due to other mental disorder: Secondary | ICD-10-CM | POA: Diagnosis not present

## 2022-08-05 DIAGNOSIS — F4001 Agoraphobia with panic disorder: Secondary | ICD-10-CM

## 2022-08-05 DIAGNOSIS — F411 Generalized anxiety disorder: Secondary | ICD-10-CM

## 2022-08-05 DIAGNOSIS — F314 Bipolar disorder, current episode depressed, severe, without psychotic features: Secondary | ICD-10-CM | POA: Diagnosis not present

## 2022-08-05 DIAGNOSIS — R6889 Other general symptoms and signs: Secondary | ICD-10-CM

## 2022-08-05 DIAGNOSIS — Z79899 Other long term (current) drug therapy: Secondary | ICD-10-CM

## 2022-08-05 MED ORDER — LITHIUM CARBONATE 300 MG PO CAPS
300.0000 mg | ORAL_CAPSULE | Freq: Every evening | ORAL | 1 refills | Status: DC
Start: 2022-08-05 — End: 2022-09-29

## 2022-08-05 MED ORDER — CLOZAPINE 25 MG PO TABS
ORAL_TABLET | ORAL | 0 refills | Status: DC
Start: 2022-08-05 — End: 2022-08-12

## 2022-08-05 MED ORDER — CLOZAPINE 25 MG PO TABS
ORAL_TABLET | ORAL | 0 refills | Status: DC
Start: 2022-08-05 — End: 2022-08-05

## 2022-08-05 NOTE — Patient Instructions (Addendum)
Get lithium level and kidney test ASAP Stop fluvoxamine Reduce lithium 300 mg capsule 1 at night Monday night: reduce to olanzapine to 20 mg at night (stop the 10 mg tablet) and start clozapine prescription. Monday 4/15 : increase clozapine to 100 mg at night and reduce quetiapine to 1/2 of 200 mg tablet and continue olanzapine 20 mg each evening.

## 2022-08-05 NOTE — Telephone Encounter (Signed)
Confirmed requested pharmacy was [facility] and they are. Sent Rx.

## 2022-08-05 NOTE — Progress Notes (Signed)
HUBBARD CROSTON [id number]:[id number] 10-09-1965 57 y.o.    Subjective:   Patient ID:  Albert [name] is a 56 y.o. (DOB 03/26/1966) male.  Chief Complaint:  Chief Complaint  Patient presents with   Follow-up   Depression   Anxiety   Altered Mental Status   Other    Recent hosp stay for medical issues    Depression        Associated symptoms include decreased concentration and suicidal ideas.  Past medical history includes anxiety.   Anxiety Symptoms include confusion, decreased concentration, dizziness, nervous/anxious behavior and suicidal ideas. Patient reports no chest pain, palpitations or shortness of breath.      Albert [name] presents to the office today for follow-up of mixed bipolar, anxiety and still grieving stress of breakup.  He requires frequent follow-up because of long-term symptoms.  At visit October 25, 2018.  We made several medicine changes because of his concerns about sleep and other issues.  We increased olanzapine back to 7.5 mg bc not sleeping as well at 5 mg.  He has to have the prescription for quetiapine written for up to 3 tablets at night in case insomnia is worse because insomnia makes his mood disorder so much worse.  We discussed that was above the usual max but the request was granted given his treatment resistant status. We also reduce lithium from 900 mg daily to 750 mg daily to try to reduce tremor and muscle twitches.  At  visit March 28, 2019.  Fluoxetine was increased to 40 mg daily.   Early January 2021 increased to 60 mg daily.  No SE.  seen June 28, 2019.  No further meds were changed except olanzapine was increased back to 10 mg daily to see if if he could get additional benefit per his request.  Covid vaccinated.  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  Then had cholecystectomy.  April 2021 appt with the following noted: 2 episodes night sweats lately.  Memory has been very bad lately.  Repeatedly asks mother questions. Still  tend to stay in his room and  his bed.  Still has rapid cycling mood swings.  Maybe some better with increase in olanzapine to 10 and tolerating itl.  Would like to get out more but can't DT Covid.  Can perform necessary chores.  Will get out of the house when he can.  No change in death thoughts and anxiety in intensity but is better with frequency.  Usually comes and goes in waves but more persistent.  Consistent with meds.  Ativan not helping anxiety very dramatically but he's not sure.  Fidgety.  No trigger other than still grieving relationship and can't get it out of his head.  Poor energy, concentration and more forgetful. In bed more and less active.  Sleep ok lately which is unusual.  Can concentrate on financial matters and stock market at times.  Tolerating meds.  Still some intrusive SI without reason. Less frequent obsessive thoughts about broken relationship and has been doing this for months.  Can't let it go.  Loop.   No unusual stress even with the family who is supportive. Likes the benefit that Zyprexa gives. No sleepwalking nor falling nor odd behavior.  No history of sleep walking.  He understands this may recur with an increase.  Also wants option to rarely take extra quetiapine 300 for sleep prn. Plan:  Try to reduce lorazepam if possible.  10/22/2019 appointment with the following noted: Continues fluoxetine  60, lithium 600 mg, lorazepam 2 mg AM and HS and 1 mg midday, olanzapine 10, quetiapine 600 mg HS. Still anxious chronically and including driving in crowded spaces.  Doesn't thing Ativan helps as well as Xanax but less cognitive problems. Sleep is not as good.  Occ EFA.  More EMA and wanting to do things in the middle of the night but this is not typical.  Wonders about why that happens.  Some napping.   Tolerating meds.  Asks about weight loss meds.  Disc this in detail.   Plan no changes except OK meclizine prn vertigo.  02/11/20 appt with the following noted: Able to gradually reduce lorazepam to 1  mg AM and HS. More depressed over time and less interested in things and less interest in going out but does with his parents. Has reduced from 800 to 600 mg HS with some awakening but usually able to go back to sleep.[name]  Spending a good amount of time in bed bc watches TV in bedroom.  Parents watch TV in different part of the house.  No mood swings he notices.   Concerns about weight gain about 200#. Likes olanzapine's benefit for sleep. Plan: Trial for TRD to  Increase fluoxetine to 80 mg daily  to use the combo with olanzapine for TR bipolar depression.   04/21/20 appt with following noted: I thought in beginning some benefit with fluoxetine.  Lifelong negative thinking continues.   Not much bipolar.  Reads a lot on bipolar.   Still tired and anxious a little more may be seasonal.  No familial stressors.  Tends to lay down in afternoon.  Anxiety not over anything in particular.   No SE with fluoxetine. Took meclizine prn.  Easily motion sick.    Asks questions about newer drugs for bipolar like Caplyta. Plan:  No med changes  06/30/20 appt noted: Tired of wearing masks.  Vaccinated.  Asked questions about when this will end with mask mandate.  Mood up and down some since here and getting up 2-3 times.  Disc awakening problems.  Trying to do better bc night eating some.   Mood is about average today so far.  Martin Majestic out with friends for breakfast.  Recognizes activity helps mood.   3 days in a row napped a lot in the afternoon.   Bed is a comfort place.  Gets bored and hard to motivate.  Tries to stay away from night eating and spending.   More depressed when alone and wonders about med changes. Plan: Failed response to olanzapine + fluoxetine for TR bipolar depression.   Reduce fluoxetine to 1 daily and reduce olanzapine to one half nightly for 10 days and then stop it. Then start [place] 1 daily  08/27/2020 appointment with the following noted: Patient decompensated with the above switch to  [place] with intrusive suicidal thoughts and had to be hospitalized for psychiatric reasons.  Multiple phone calls with family members since that time.  Spoke with the psychiatric nurse practitioner with the decision to restart olanzapine in place of the [place] given the patient was more stable while on the combination of Seroquel and olanzapine than with the [place]. Caplyta triggered HI/SI and depressed and confused on it, and agitated and still has some of it now. No akathisia. Frustrated with lack of therapy at the hospital.   Hydroxyzine didn't help jitteriness.  Feels some better.  Fleeting SI & HI but not obsessive like it was prior to hospitalization.  Feels a  little amped up and nervous.  Scared of what could happen.  Apprehensive being here talking about things. 5-6 hours sleep last night and would like to have more. At mom and dad's house right now and up more in the day. Inadvertently stopped lorazepam abruptly by mistake likely contributing to shakiness. Still some memory issues.  Trying to stay out of bed when watching TV.  Some awakening but managing. Occ fleeting SI and distracts himself.   Always spends a lot of time just laying around.   Can have anxiety for no reason like coming here, and varies in intensity without pattern.      Less panic than in the past.  Some chronic depression and hyperactivity and loudness and hyperverbal.  Usually back to sleep.  Total 6 hours but some napping, awakens 2-4 times nightly but back to sleep..  Taking quetiapine 600 mg HS.  still lacks interest and motivation.  Can follow a TV show if interested. Plan: Restart lorazepam 1 mg in the morning, 1 mg in the afternoon and 1 mg at night for anxiety Stop hydroxyzine  Increase olanzapine to 1 and 1/2 of 10 mg tablets in evening  09/05/2020 appt noted: seen with parents today at his request Made med changes noted and tolerated the changes Better than I did last week. Now only fleeting HI/SI and "no where  near what it was".  Sleeping better.  Still not a lot of energy.  Anhedonia.  Less agitation.  A lot of anxiety all the time but it's helping.  Would like more energy and motivation.   Doesn't handle stress well. Parents note he's less shakey.  He's still staying with his parents.  Only driven once since this happened and F wants him to be able to drive and function more independently.  M agrees he's better.  Memory is better but not normal.  Primarily STM problems No akathisia with olanzapine this time.    Administering his own meds but mo watched. Slept 8 hours last night. Plan consider further reductions in quetiapine  10/17/20 appt noted: Stayed on Seroquel 600 HS and olanzapine 15. Olanzapine seemed to do most for the sleep.  Also helping eliminated HI/SI for the most part.  Wants to try to increase it.  Depression is much better with less rumination also.  Fears akathisia at higher dose as in the past. Also on fluoxetine 40.   Plan: Reduce Seroquel to 1 and 1/2 tablets at night Increase olanzapine to 1 of the 15 mg tablet and 1/2 of the 5 mg tablet for 1 week,  Then, if tolerated increase the olanzapine to 20 mg or 1 of the 15 mg tablets and 1 of the 5 mg tablets. If possible then also reduce quetiapine to 1 tablet at night.  11/05/2020 appointment with the following noted: Up to olanzapine 20 mg hs for 3 days.  Reduced Serooquel to 450 mg HS.  Didn't try to go lower. Had hiatal hernia and umbilical hernia surgery recently with no problem.   No akathisia so far. Sleep ok with meds so far changes.  No mood changes yet but overall likes the smoothness of olanzapine and helping sleep without knociing him out. Still some occ HI/SI, he thinks bc of anxiety generally.  Easily anxious. Plan: Continue olanzapine 20 mg nightly longer to give it time to help mood sx reduce quetiapine to 1 tablet at night to minimize polypharmacy and reduce risk of akathisia.  01/07/21 appt noted: Able to reduce  quetiapine 300 mg HS ok with decent sleep but still wakes a lot.  No AM hangover.  Don't have a lot to do.  Can enjoy going out and doing things.  But doesn't do it longterm.  Family is doing good.  Father started year 2 at his job.   Tolerating the meds well.  May wake more with quetiapine reduction. Lost 10#. Wonders if could try other meds he didn't tolerate before bc now can tolerate olanzapine and couldn't tolerate it in the past DT akathisia. Mood never great but can swing into depresssion without reason.   Plan: Continue olanzapine 20 mg nightly longer to give it time to help mood sx reduce quetiapine to 1/2 of the 300 mg  tablet at night to minimize polypharmacy and reduce risk of akathisia.  02/02/2021 appointment with the following noted: Tried reduced quetiapine to 150 mg and after a week had withdrawal sx including jittery and SI and he increased to 250 mg daily.  Felt better after increasing to 300 mg daily and last week reduced to 250 mg daily.  After increase quetiapine felt better in a few days.  Seems more alert with less quetiapine but can't tolerate a quick withdrawal.   Some awakening.  Plan: Reduce quetiapine by 50 mg every 2-4 weeks.  03/09/2021 phone call: Pt called and said that he is weaning off the seroquel. He was down to 200 mg of the seoquel and he started having withdrawal symptoms and sucidal thoughts. He went back up to 250 mg because he knew he was good on that.    Pt stated he starting back taking 250 mg on Saturday due to the symptoms he was having.Now he is no longer having suicidal thoughts but still very jittery. MD: He may just need to taper slower than most people.  If he can tolerate the 250 mg Seroquel with the jitteriness it should get better over the next 1 to 2 weeks.  Then he can try going down again by 50 mg at a time.  He probably needs to go down by just 50 mg every 2 to 4 weeks and wait till he fully adjusts to each reduction before he reduces  again.  03/17/21 appt noted: Hernia surgery 2 weeks ago. Seroquel 250 mg for a month then 200 mg daily and had SI and increased again to 300 mg daily.  Would love to get off it.  Withdrawal triggers intrusive HI/SI but otherwise doesn't have them No SE. Sleep is OK but not enough REM sleep.  Satisfied with other meds.   No current HI/SI.  No paranoia.  Still has anxiety and taking Ativan every 6 hours.  It works but doesn't cure it. Chronic anxiety and ask about ketamine for chronic depression. Plan: Because he had WD reducing from 250 to 200 mg daily will have to go slowly. Reduce quetiapine by 25 mg every 2-4 weeks. Reduce Seroquel to 250 mg for 2 weeks, Then reduce to 225 mg for 2 weeks, Then reduce to 200 mg for 2 weeks, Then reduce to 175 mg for 2 weeks, Then reduce to 150 mg Also: Buspirone 30 mg tablets for anxiety Start 1/3 tablet twice daily for 1 week Then increase to two thirds twice daily for 1 week Then increase to 1 tablet twice daily  04/15/2021 appointment with the following noted: Reduced Seroquel to 200 mg HS and increased buspirone to 30 mg BID STM px and wants to try to reduce Ativan to see if  it is better.  But fears with drawal sx. Parents complain about his memory. Less HI and SI since here. Less depressed. Plan: Buspirone 30 mg tablets for anxiety Start 1/3 tablet twice daily for 1 week Then increase to two thirds twice daily for 1 week Then increase to 1 tablet twice daily Continue olanzapine 20 mg nightly longer to give it time to help mood sx Because he had WD reducing from 250 to 200 mg daily will have to go slowly. Reduce quetiapine by 25 mg every 2-4 weeks. Reduce Seroquel to 250 mg for 2 weeks, Then reduce to 225 mg for 2 weeks, Then reduce to 200 mg for 2 weeks, Then reduce to 175 mg for 2 weeks, Then reduce to 150 mg Trial taper lorazepam: In hopes of improving short-term memory  04/30/2022 phone call: Complaining of withdrawal symptoms  reducing lorazepam from 0.5 mg 5 times a day to 0.5 mg 4 times daily.  05/21/2021 appointment with the following noted: Gotten onto buspar 30 BID and down on lorazepam to 0.5 mg QID. Gotten down to Seroquel 200 mg HS A little restless every now and then but able to reduce lorazepam for the last weeks. Is too inactive and lays in bed too much watching TV.   Can' t tell effect of buspirone bc of other changes.  Sleep with awakening with 6-7 hours. Fleeting SI without trigger or plan or intent. Depressed more than anxious. Enjoys watching stocks and investing. Plan: Trial taper lorazepam gradually over time In hopes of improving short-term memory But for now continue lorazepam 0.5 mg QID Start clonidine 0.1 mg tablets one half at night for 3 nights, then 1/2 tablet twice daily for 3 nights, then one half in the morning and 1 tablet at night If clonidine does not noticeably help anxiety call the office  05/27/2021 phone call initially complaining of homicidal and suicidal thoughts after taking 1 mg of clonidine.  He was told to stop the medication.  But then called back stating he was having homicidal and suicidal thoughts from withdrawal from the medication which was not logical.  He agreed it was not logical and stated on 05/29/2021 that he felt better  06/23/2021 appointment the following noted: No clonidine. Sunday panic attack eating out and then having SI.  Stayed with father to feel safer. Intrusive thoughts of hurting parents or others.  HI only when has SI.  Usually panic without SI.  Then had intrusive thoughts about hurting others.   Usually intrusive thoughts are brief and fleeting.    07/20/21  appt noted; More anxious with less lithium.  More anxious in his body.  Not more depressed or irritable.  Sleep is the same.  More racing thoughts.  Chronic SI probably a litte worse but not unmanageable. Rduced lithium last week to 300 mg daily. No recent change in lisinopril. Off the  clonidine for a week or so. Doesn't drink much water.  Drinks a lot of diet coke. No marked akathisia or RLS but does shake his leg DT anxiety. Worries about needing mor emedicine. Plan: Increase olanzapine to 25 mg nightly bc more racing thoughts and anxiety with less lithium DT severity of sx and difficulty getting off Seroquel conside rincrease.  07/30/21 TC : CO racing SI today and wonders about whether increased sx racing thoughts, hyperactivity, SI related to Reduced lithium from 600 mg daily to 300 mg daily recently DT lithium level 1.8.  could be related.    Plan: increase olanzapine  30 mg pm Today take lithium 600 mg now.  Tomorrow start lithium CR 450 mg daily Continue olanzapine 30 mg PM until SI resolves unless akathisia returns.  Lynder Parents, MD, DFAPA  08/17/21 appt noted:  No akathisia with olanzapine 30 mg pm (about 3 weeks) and is sleeping better. Increased lithium to 450 mg daily. Tolerating meds. Still racing negative thoughts maybe some better.  Asked about it. SI come and go.   Anxiety about the same as depression.  No motivation unless has to do something. Plan: Reduce  fluoxetine 20 mg 1 daily in hopes racing thoughts and anxiety are better.  09/14/2021 appointment with the following noted: No difference noted with reduction fluoxetine 20 mg daily. Most of the time negative thoughts, ex "what if I get in a wreck?"  Frequent negative thoughts.   Still on lorazepam 0.5 mg TID.   Wonders about trying prior meds that caused akathisia bc no longer gets it with olanzapine 30 mg daily. Sleep is usually ok.  Taking Seroquel 200 mg HS.  Thinks the olanzapine helps his sleep and doesn't want to stop it. Panic 1-2 times per month.  Then takes extra lorazepam. Plan: DC fluoxetine 20 mg 1 daily  Trial Auvelity off label for depression 1 in the AM for 1 week then 1 in AM and 1 with evening meal  10/15/2021 appointment with the following noted: Occ of getting confused as to day  and went to church on a Thursday my mistake.   Had it happen a few months ago too.  Memory is terrible. Thinks he couldn't tolerate more Auvelity. Not a lot of benefit from [place] 1 daily. Still quite a bit of anxiety.   A little drowsy  and sleeps 7-8 hours at night. Word finding problems.  Lost wallet. Plan: DC  Auvelity  For [facility]: memantine 1/2 tablet in the AM for 1 week, then 1/2 tablet twice daily, then 1/2 tablet in the AM and 1 tablet at night for 1 week then 1 tablet twice daily  10/22/2021 phone call complaining of additional stress asking to start new medication because of anxiety and more suicidal thoughts without intent or plan.  Reports taking more than 4 prescribed Ativan a day recently.  Asking to increase olanzapine which is already at 30 mg daily. MD response:We cannot go any higher in the dosage of olanzapine that he is currently taking.  A lot of these suicidal thoughts are apparently anxiety driven.  Let us have him resume taking paroxetine which is one of the more effective medicines for anxiety.  I will send it in and the instructions will be paroxetine 20 mg tablets 1/2 tablet daily for 1 week then 1 tablet daily  11/16/21 appt noted: Multiple phone calls since she was here.  Complaining of suicidal thoughts from memantine which was stopped. Started paroxetine and up to 20 mg daily.  Continues olanzapine 30 mg, lithium 450 mg nightly, lorazepam 0.5 mg 4 times daily , And quetiapine 200 mg nightly. Questions about whether memantine caused the neg SI really bc has them at times. When has SI often anxiety driven.  Will tend to ruminate on past relationship ended.   Edgy and irritable all the time but memantine seemed to make it worse. Mind is getting better with less SI lately.   No SE with paroxetine.   Aggrivated by forgetfulness.  eXample, talked with someone about going to breakfast last night and then forgot to go this mornin.g. Plan prescribed donepezil  5 mg daily for  cognitive complaints specifically forgetfulness  12/31/2021 appointment with the following noted: He had been prescribed donepezil 5 mg for cognitive complaints.  He called back reporting he felt it was causing homicidal thoughts and was instructed to stop it.  He has had these types of thoughts in the past.  He had no desire to act on them. Discouraged. Ongoing depression and reduced enjoyment and negative thoughts.  Can enjoy some things. A lot of anxiety chronically.  Trying to limit Ativan to 5 of 0.5 mg daily.  Sometimes needs 2. Constipation for years.  02/03/22 appt noted: Depression makes LBP worse. Ongoing chronic depression. Tolerated rivastigmine without triggering SI Chronic anxiety and Ativan helps some. Occ panic but not usual.  Asks if any other meds coud be used. Plan: But for now continue lorazepam 0.5 mg QID, but use LED for cog reasons Increase rivastigmine to 3 mg capsule 1 twice daily or 2 of the 1.5 mg capsules twice daily.  Call if you have problems with nausea Start dextromethorphan 1 capsule twice daily . Thi sis off label for depression based on MOA of Auvelity and use of paroxetine to prolong half life of DM. Presence of paroxetine may have been reason he didn't tolerate Auvelity brief trial before.  Will proceed slowly.  03/17/2022 appointment noted: Current psych meds: Rivastigmine 3 mg twice daily, quetiapine 200 mg nightly, paroxetine 40 mg daily, olanzapine 30 mg nightly, Namenda 10 mg daily, lorazepam 0.5 mg 4 times daily, lithium CR 450 mg nightly, dextromethorphan 15 mg twice daily A lot of anxiety since here.  Some days taken more lorazepam to 6-7 tablets daily.  No major triggers but anxious even about coming here and other normal things.  Cancelled trip to collect rocks with friend and cancelled the trip bc anxious.  Usually if can go he is ok.  Lots of SI ongoing Memory might be a little better. Asks how he responded to [employer].  Will check paper  chart.  03/24/22 TC:  CO SI worse and restilless with Rivastigmine 9 mg . MD response:  Stop the rivastigmine since the [place] appears to be having side effects from it.  He was having anxiety at the last appointment 2 and then we increased the dose.  This might be causing the anxiety to be worse. He has chronic suicidal thoughts without intent or plan.  However it appears the thoughts are more intense with the rivastigmine     05/06/21 appt noted: Stopped rivastigmine DT anxiety and worsening SI.  Was better the next day. This week had some SI.  Thinking about the prior relationship tends to lead to SI. Will take Ativan when gets SI and it helps a little at 0.5 mg at a time. Plan: Consider fluvoxamine.  Yes this may help with obsessions on old GF and he failed other optioons. Start fluvoxamine 1/2 of 50 mg tablet and reduce paroxetine to 1 and 1/2 of 20 mg tablets for 5 days, Then increase fluvoxamine to 1 tablet daily and reduce paroxetine to 1 tablet daily for 5 days, Then increase fluvoxamine to 1-1/2 tablets daily and reduce paroxetine to 1/2 tablet daily for 5 days, Then increase fluvoxamine to 2 tablets daily and stop paroxetine   06/07/22 appt noted: Feels like the fluvoxamine 50 BID is too much.  On this a couple of weeks. Worries he might be worse with this with regard to SI.    He thinks it's worse with increase.  New neighbor who has a young boy. He worries that Mancel Parsons is triggering depression and intrusive SI/HI a couple of days later and lasts a little while. Has had anxiety turning into panic attacks.  This gets associated with depression and SI/HI.  These thoughts are intrusive and he doesn't want to act on them.  Can have HI even towards family with whom he has no negative emotions.  Little things like a bump up in parking lot sets off his depression and anxiety and doesn't seem to handle things.   No sig akathisia.  In the past it's been continuous.   Awakens every 2-3 hours.   Usually goes back to sleep with meds. Has not had alcohol in 20 years and had some intrusive thoughts without drinking.   Plan: As soon as he feels comfortable increase fluvoxamine to 50 mg AM and 100 mg PM for intrusive thoughts of HI/SI and obs on GF  07/06/22 appt noted: Increased fluvoxamine to 150 mg daily and felt more agitated and some SI so reduced to 100 mg 4 days ago and feels better so far.  Likes the fact it is good for anxiety and obs thoughts.  Wonders if there is a similar med.  Frustrated he can't get relief.  Some improvement in obs on exGF but not resolved. Continue other meds: lithium CR 450 mg HS, lorazepam 0.5 mg QID, Memantine 10 BID, olanzapine 30 mg HS, quetiapine 200 mg HS Tingling and burning in legs at night but doesn't interfere with sleep. Got insurance to pay for [facility] Center and lost 13#.   No full panic lately. But some anxiety waves.   Still major problems with memory. Plan: Wean fluvoxamine 50 mg daily for a month then 1/2 daily for 2 weeks then stop it DT  NR.  Watch for more anxiety.  07/28/22 TC: Elmore Guise, CMA  to Me     07/28/22  4:16 PM Note Mom called reporting patient has been confused, dizzy, and sluggish for the last 3 days. He is now staying with her. She had several questions in regards to this:   Lithium level - she feels he needs one done, last result in Epic was 08/2021 that I see. She would like order sent to [facility] for tomorrow.   Patient is on Wegovy, has been for about a month, and she wonders if this could cause sx.   Patient has meclizine and she is questioning if she should give that for his dizziness considering other sx.   She questions if perhaps he got up during the night and took additional medications, though doesn't know if this happened and if so what he took.    On 3/5 visit he was to wean fluvoxamine to 50 mg qd for a month and then 1/2 tab for 2 weeks and then stop. Mom and pt unsure if he started the wean.    He has F/U  4/4.        Me  to Elmore Guise, CMA   CC   07/28/22  5:44 PM Note Yes get lithium level in the AM and don't take any lithium tonight or in the morning before the blood test.     Mancel Parsons should not cause these sx except if he is losing wt quickly or is dehydrated it can cause lithium toxicity and include these sx.  He needs to drink lots of water.   He should go ahead and wean the fluvoxamine as instructed in the  last appt.   If the sx get worse, go to the ER.    Me   CC   07/29/22  6:49 PM Note RTC   Sx not as severe as when he had to go to hospital in the past.  Martin Majestic got Lithium level today.   Speech is not good .  Confused at times, but better today.  A little tremor .  A little balance problems.  No falls.  No diarrhea Took lihtium 450 last night. Mo says he got up in the middle of the night and moved things around last night and he doesn't rmember.   No change in night time meds.   Gradually reducing fluvoxamine.   No extra lorazepam.     Spoke with mother who verifies the same.   No med changes but push fluids.   Lynder Parents, MD, DFAPA      08/05/22 appt : with parents Pt hosp 08/02/22 with acute kidney injury and somewhat elevated lithium level with some confusion.  Spoke with psych NP at hosp and lithium was decreased to 450 mg HS. No pending appt known with medical doctors about kidney function. Don't feel good and having HI and SI and feels he needs lithium for these issues.  Doesn't feel he should have been discharged.   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 No added meds at night but has been confused at night and getting up multiple times and doing things he doesn't remember.  This concerns parents.  Parents been up at night with him and he doesn't make sense.  Still groggy in the am per mo for an hour.     Psych med hx extensive including ECT and  risperidone, Zyprexa 30 Latuda 80 which caused akathisia,  Vraylar, Rexulti, aripiprazole 20 mg with akathisia,   Seroquel 1000 mg,  InVega, Geodon, Saphris with side effects, symbyax, Fanapt NR .  Caplyta SE and markedly worse. lithium 1200 SE,  lamotrigine 300 mg, Depakote 2000 mg, Tegretol, Trileptal and several of these in combinations, gabapentin,  N-acetylcysteine, Nuedexta,     Belsomra with no response,  Lunesta no response, trazodone 200 mg,  Xanax, clonazepam, lorazepam less sedation. Buspirone NR Clonidine SE with 2 trials  Viibryd 40 mg for 3 months with diarrhea, protriptyline with side effects,  Trintellix 20 mg,  Parnate 50 mg with no response,   imipramine, venlafaxine,  Emsam 12 mg for 2 months,   bupropion was side effects,  Lexapro 20 mg, sertraline, paroxetine, Deplin, fluoxetine 80, fluvoxamine 150 agitated Auvelity NR at one daily.  SE BID  methylphenidate 60 mg,  Vyvanse, Concerta, strattera, , modafinil,  Memantine worsening SI? Donepezil, complained of HI, Exelon 3 BID rivastigmine DT worsening SI  pramipexole,  amantadine ,  Patient prone to akathisia.  [facility]   Review of Systems:  Review of Systems  Respiratory:  Negative for shortness of breath.   Cardiovascular:  Negative for chest pain and palpitations.  Musculoskeletal:  Positive for back pain.  Neurological:  Positive for dizziness and tremors.       Fidgety  Psychiatric/Behavioral:  Positive for confusion, decreased concentration and suicidal ideas. Negative for agitation, behavioral problems, dysphoric mood, hallucinations, self-injury and sleep disturbance. The patient is nervous/anxious. The patient is not hyperactive.     Medications: I have reviewed the patient's current medications.  Current Outpatient Medications  Medication Sig Dispense Refill   acetaminophen (TYLENOL) 500 MG tablet Take 1,000 mg by mouth every 6 (six)  hours as needed for moderate pain.     bisacodyl (DULCOLAX) 10 MG suppository Place 1 suppository (10 mg total) rectally as needed for moderate  constipation. 12 suppository 0   esomeprazole (NEXIUM) 40 MG capsule Take 40 mg by mouth daily.     fenofibrate (TRICOR) 145 MG tablet Take 145 mg by mouth daily.     fluticasone (FLONASE) 50 MCG/ACT nasal spray Place 1 spray into both nostrils daily as needed for allergies or rhinitis.     fluvoxaMINE (LUVOX) 50 MG tablet 1 in the AM and 2 tablets in PM (Patient taking differently: Take 25 mg by mouth at bedtime. Reducing and started 1/2 daily) 90 tablet 1   hydrocortisone (ANUSOL-HC) 2.5 % rectal cream Place 1 application rectally 3 (three) times daily as needed for hemorrhoids.     levothyroxine (SYNTHROID, LEVOTHROID) 75 MCG tablet Take 75 mcg by mouth daily before breakfast.     lisinopril (ZESTRIL) 5 MG tablet Take 5 mg by mouth daily.     lithium carbonate (ESKALITH) 450 MG ER tablet TAKE ONE TABLET BY MOUTH AT BEDTIME 90 tablet 0   LORazepam (ATIVAN) 0.5 MG tablet TAKE ONE TABLET BY MOUTH FOUR TIMES DAILY 120 tablet 1   meclizine (ANTIVERT) 25 MG tablet Take 1 tablet (25 mg total) by mouth 3 (three) times daily as needed for dizziness. 30 tablet 1   OLANZapine (ZYPREXA) 10 MG tablet TAKE 1 TABLET BY MOUTH AT BEDTIME 90 tablet 0   OLANZapine (ZYPREXA) 20 MG tablet TAKE ONE TABLET BY MOUTH AT BEDTIME 30 tablet 0   polycarbophil (FIBERCON) 625 MG tablet Take 1,250 mg by mouth daily.     QUEtiapine (SEROQUEL) 200 MG tablet TAKE 1 TABLET BY MOUTH AT BEDTIME 90 tablet 0   sildenafil (VIAGRA) 100 MG tablet Take 1 tablet (100 mg total) by mouth daily as needed for erectile dysfunction. 30 tablet 6   tamsulosin (FLOMAX) 0.4 MG CAPS capsule Take 1 capsule (0.4 mg total) by mouth daily. 90 capsule 3   WEGOVY 1 MG/0.5ML SOAJ Inject 1 mg into the skin once a week.     No current facility-administered medications for this visit.   Medication Side Effects: Other: mild sleepiness.   Occ twitches.\, tremor  Allergies:  Allergies  Allergen Reactions   Meloxicam Other (See Comments)    Other  reaction(s): Dizziness   Nsaids     Other reaction(s): Hallucination, Other (See Comments)   Ibuprofen     Can not take because taking lithium   Prednisone     Can't sleep    Wellbutrin [Bupropion]     Suicide thoughts    Past Medical History:  Diagnosis Date   ADHD (attention deficit hyperactivity disorder)    Anemia    Anxiety    Bipolar disorder    BPH (benign prostatic hyperplasia)    Chronic kidney disease, stage 3b    Constipation    DDD (degenerative disc disease), cervical    Depression    Deviated septum    ED (erectile dysfunction)    GERD (gastroesophageal reflux disease)    Graves disease    History of hiatal hernia    Hypertension    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    Hypothyroidism    Pneumonia    PONV (postoperative nausea and vomiting)     Family History  Problem Relation Age of Onset   Prostate cancer Neg Hx    Bladder Cancer Neg Hx  Kidney cancer Neg Hx     Social History   Socioeconomic History   Marital status: Single    Spouse name: Not on file   Number of children: Not on file   Years of education: Not on file   Highest education level: Not on file  Occupational History   Not on file  Tobacco Use   Smoking status: Former    Types: Cigarettes    Passive exposure: Past   Smokeless tobacco: Former    Types: Nurse, children's Use: Never used  Substance and Sexual Activity   Alcohol use: Not Currently   Drug use: Never   Sexual activity: Yes  Other Topics Concern   Not on file  Social History Narrative   Not on file   Social Determinants of Health   Financial Resource Strain: Not on file  Food Insecurity: Not on file  Transportation Needs: Not on file  Physical Activity: Not on file  Stress: Not on file  Social Connections: Not on file  Intimate Partner Violence: Not on file    Past Medical History, Surgical history, Social history, and Family history were reviewed and updated as  appropriate.   Please see review of systems for further details on the patient's review from today.   Objective:   Physical Exam:  There were no vitals taken for this visit.  Physical Exam Constitutional:      General: He is not in acute distress.    Appearance: He is well-developed.  Musculoskeletal:        General: No deformity.  Neurological:     Mental Status: He is alert and oriented to person, place, and time.     Cranial Nerves: No dysarthria.     Motor: Tremor present.     Coordination: Coordination normal.     Comments: mild tremor  Psychiatric:        Attention and Perception: Attention and perception normal. He does not perceive auditory or visual hallucinations.        Mood and Affect: Mood is anxious and depressed. Affect is not labile, blunt, angry, tearful or inappropriate.        Speech: Speech normal. Speech is not slurred.        Behavior: Behavior normal. Behavior is not slowed. Behavior is cooperative.        Thought Content: Thought content is not delusional. Thought content includes suicidal ideation. Thought content does not include homicidal ideation. Thought content does not include suicidal plan.        Cognition and Memory: Cognition normal. He exhibits impaired recent memory.        Judgment: Judgment normal.     Comments: Insight fair Chronically talkative .directable. He is chronically anxious  worse with less lithium No manic signs noted. Some wordfinding problems ongoing. Intrusive thoughts lately trigger SI Fidgety today but not uncomfortable Alert and oriented.      Lab Review:     Component Value Date/Time   NA 137 08/02/2022 0434   NA 135 07/29/2022 1317   NA 142 03/20/2014 0514   K 4.6 08/02/2022 0434   K 4.1 03/20/2014 0514   CL 109 08/02/2022 0434   CL 112 (H) 03/20/2014 0514   CO2 21 (L) 08/02/2022 0434   CO2 26 03/20/2014 0514   GLUCOSE 97 08/02/2022 0434   GLUCOSE 99 03/20/2014 0514   BUN 23 (H) 08/02/2022 0434   BUN 26  (H) 07/29/2022 1317   BUN 7  03/20/2014 0514   CREATININE 2.08 (H) 08/02/2022 0434   CREATININE 0.78 03/20/2014 0514   CALCIUM 9.8 08/02/2022 0434   CALCIUM 9.5 03/20/2014 0514   PROT 7.2 08/01/2022 1750   PROT 7.3 03/18/2014 1459   ALBUMIN 4.7 08/01/2022 1750   ALBUMIN 3.2 (L) 03/18/2014 1459   AST 20 08/01/2022 1750   AST 16 03/18/2014 1459   ALT 14 08/01/2022 1750   ALT 16 03/18/2014 1459   ALKPHOS 25 (L) 08/01/2022 1750   ALKPHOS 105 03/18/2014 1459   BILITOT 0.7 08/01/2022 1750   BILITOT 0.3 03/18/2014 1459   GFRNONAA 36 (L) 08/02/2022 0434   GFRNONAA >60 03/20/2014 0514   GFRAA 54 (L) 04/18/2019 1612   GFRAA >60 03/20/2014 0514       Component Value Date/Time   WBC 6.3 08/01/2022 1750   RBC 3.69 (L) 08/01/2022 1750   HGB 11.6 (L) 08/01/2022 1750   HGB 12.6 (L) 03/20/2014 0514   HCT 35.8 (L) 08/01/2022 1750   HCT 38.4 (L) 03/20/2014 0514   PLT 376 08/01/2022 1750   PLT 346 03/20/2014 0514   MCV 97.0 08/01/2022 1750   MCV 89 03/20/2014 0514   MCH 31.4 08/01/2022 1750   MCHC 32.4 08/01/2022 1750   RDW 12.7 08/01/2022 1750   RDW 12.5 03/20/2014 0514   LYMPHSABS 1.7 08/01/2022 1750   LYMPHSABS 3.2 03/20/2014 0514   MONOABS 0.5 08/01/2022 1750   MONOABS 1.0 03/20/2014 0514   EOSABS 0.4 08/01/2022 1750   EOSABS 0.4 03/20/2014 0514   BASOSABS 0.1 08/01/2022 1750   BASOSABS 0.1 03/20/2014 0514    Lithium Lvl  Date Value Ref Range Status  08/02/2022 1.04 0.60 - 1.20 mmol/L Final    Comment:    Performed at [facility], [place] [address] [place], [place] [address]   08/12/21 lithium level 0.9 on 450 mg.  lithium level Sept 0.8.   lithium level July 27, 2018 was normal at 1.0.   Lithium level [facility] October 03, 2018 = 1.2. Said he got lithium level at [place] as requested.  Labs not in Epic.  Recent lipids ok except higher TG than usual.  Normal A1C.  No results found for: "PHENYTOIN", "PHENOBARB", "VALPROATE", "CBMZ"   .res Assessment: Plan:     Coury was seen today for follow-up, depression, anxiety, altered mental status and other.  Diagnoses and all orders for this visit:  Severe bipolar I disorder, current or most recent episode depressed  Generalized anxiety disorder  Panic disorder with agoraphobia  Insomnia due to mental condition  Mild cognitive impairment  Forgetfulness  Lithium use    Chronic TR bipolar mixed and chronic anxiety.  He usually has mixed bipolar symptoms which we have not been able to completely eliminate.  See long list of meds tried.   He is somewhat chronically unstable but better on olanzapine worse if reduces quetiapine quickly.  Recent hosp 08/02/22 with SI and acute kidney injury and elevated lithium. Processed this and disc goal of trying to stop lithium but risk of worsening SI.  He has chronic SI and there's risk of this worsening off lithium.  Lithium and clozapine are the most effective meds for SI.  Possible that [facility] made him dehydrated and constipation and caused incr lithium level.  Had 15-20 # wt loss.   Discussed mother's concerns about his memory which is primarily a short-term memory issue.  We discussed the alternative options of using Namenda off label for mild cognitive impairment.  We  are not yet able to reduce the dose of lorazepam but that could potentially help as well.  His memory was much worse on Xanax than lorazepam. We discussed the short-term risks associated with benzodiazepines including sedation and increased fall risk among others.  Discussed long-term side effect risk including dependence, potential withdrawal symptoms, and the potential eventual dose-related risk of dementia. Disc newer studies refute dementia risks.  Unfortunately due to the severity of set his symptoms polypharmacy is a necessity.  Disc unusual combo antipsychotics but it helps more than other options.  Discussed potential metabolic side effects associated with atypical antipsychotics, as well  as potential risk for movement side effects. Advised pt to contact office if movement side effects occur.  He had some falling around the time when he previously took olanzapine but he took it for several months at this dosage before he had any side effect issues and he was on Xanax at the time that the following occurred. Take LED Seroquel to sleep.   No obvious alternatives for sleep.    Lithium being used bc chronic SI and death thoughts.   Counseled patient regarding potential benefits, risks, and side effects of lithium to include potential risk of lithium affecting thyroid and renal function.  Discussed need for periodic lab monitoring to determine drug level and to assess for potential adverse effects.  Counseled patient regarding signs and symptoms of lithium toxicity and advised that they notify office immediately or seek urgent medical attention if experiencing these signs and symptoms.  Patient advised to contact office with any questions or concerns. reduce lithium  300 mg daily.  Will plan to stop if clozapine starts working. Check lithium level and BMP Lithium level 0.7 on 3/115/23 on 300 mg daily Call if death thoughts worsen or worsening SI.   Disc SE. Disc combo with Seroquel may lower response rate but he doesn't think he can sleep without Seroquel.  continue olanzapine to 30 mg nightly  for more racing thoughts and anxiety with less lithium DT severity of sx and difficulty getting off Seroquel conside rincrease.  Disc in detail again clozapine.  Disc data showing less SI with it.  Disc risk in detail including low WBC with complication, myocarditis.  Extensive discussion of CBC monitoring.  Disc this again.  He doesn't want to do this now  Disc this again bc taking Wegovy might help him to tolerate it better.  He's started [facility]  Disc WD  and may need to go lower before he can stop it. continue Seroquel 200 mg .  Would like to see him taper off if possible.  DC fluvoxamine.   ? Contributing to confusion at nigh  Did he take duloxetine?  Might help depression more.  Consider retry pramipexole off label   But for now continue lorazepam 0.5 mg QID, but use LED for cog reasons  For [facility] had to stop rivastigmine DT worsening SI  CBT dealing with obsession around old GF discussed.  We discussed the short-term risks associated with benzodiazepines including sedation and increased fall risk among others.  Discussed long-term side effect risk including dependence, potential withdrawal symptoms, and the potential eventual dose-related risk of dementia.  But recent studies from 2020 dispute this association between benzodiazepines and dementia risk. Newer studies in 2020 do not support an association with dementia.  Discussed safety plan at length with patient.  Advised patient to contact office with any worsening signs and symptoms.  Instructed patient to go to the [facility]  emergency room for evaluation if experiencing any acute safety concerns, to include suicidal intent.  He commits to safety.  Disc Ozempic and Monjauro for weight loss.  Has had weight gain from meds as a contributor. But lately has lost some weight.  Disc Dr. Ardelia Mems mentioned evidence it could help depression.  I rec he continue this also.  Unlikely to worsen SI  Needs to keep up activity as much as possible for depression.  Take Miralax daily for constipation.  Constipation management 1.  Lots of water 2.  Powdered fiber supplement such as MiraLAX, Citrucel, etc. preferably with a meal 3.  2 stool softeners a day 4.  Milk of magnesia or magnesium tablets if needed  Could be SE Seroquel.  Plan: Get lithium level and kidney test ASAP Stop fluvoxamine Reduce lithium 300 mg capsule 1 at night Monday night: reduce to olanzapine to 20 mg at night (stop the 10 mg tablet) and start clozapine prescription. Monday 4/15 : increase clozapine to 100 mg at night and reduce quetiapine to 1/2 of 200 mg tablet  and continue olanzapine 20 mg each evening.  This appt was 90 mins.  FU 2 weeks   Lynder Parents, MD, DFAPA  Future Appointments  Date Time Provider [place]  09/07/2022  1:00 PM Cottle, Billey Co., MD [facility] None  10/06/2022  1:00 PM Cottle, Billey Co., MD [facility] None  12/30/2022  2:30 PM Billey Co, MD [facility] None    No orders of the defined types were placed in this encounter.      -------------------------------

## 2022-08-05 NOTE — Telephone Encounter (Signed)
Mom called and said that [facility] is not certified to fill the clozapine. So they would like you to send it to [facility] [address] in [place]

## 2022-08-06 ENCOUNTER — Encounter: Payer: Self-pay | Admitting: Psychiatry

## 2022-08-06 LAB — BASIC METABOLIC PANEL
BUN/Creatinine Ratio: 8 — ABNORMAL LOW (ref 9–20)
BUN: 15 mg/dL (ref 6–24)
CO2: 18 mmol/L — ABNORMAL LOW (ref 20–29)
Calcium: 10.2 mg/dL (ref 8.7–10.2)
Chloride: 104 mmol/L (ref 96–106)
Creatinine, Ser: 1.94 mg/dL — ABNORMAL HIGH (ref 0.76–1.27)
Glucose: 92 mg/dL (ref 70–99)
Potassium: 4.4 mmol/L (ref 3.5–5.2)
Sodium: 138 mmol/L (ref 134–144)
eGFR: 39 mL/min/{1.73_m2} — ABNORMAL LOW (ref >59)

## 2022-08-06 LAB — LITHIUM LEVEL: Lithium Lvl: 1.1 mmol/L (ref 0.5–1.2)

## 2022-08-06 NOTE — Progress Notes (Signed)
Lithium level 1.1 on 450 mg . Cr 1.94  (baseline 1.5-1.6 over the last 2 years. Have reduced lithium again to 300 mg daily after blood draw and will taper off as soon as SI/HI can be managed with clozapine.  Just starting.

## 2022-08-09 ENCOUNTER — Telehealth: Payer: Self-pay | Admitting: Psychiatry

## 2022-08-09 NOTE — Telephone Encounter (Signed)
Dad provided the answers to all of the listed questions from Dr. Alwyn Ren last visit note. I told him this info should also be in the AVS. He did not have access to patient's MyChart, so I could not put a message in with info from last visit note, so it was in writing exactly what the medication changes were.

## 2022-08-09 NOTE — Telephone Encounter (Signed)
Next visit is 08/20/22. Albert [name] mom.called. She is listed on DPR to speak to and Albert [name] his dad is on DPR to speak to. Albert said that Dr. Jennelle Human changed a few items when Albert [name] was last here. She wants to clarify that she starts Clozapine 25 mg tonight, stop 10 mg of Zyprexa tonight. Does she continue giving him Lithium 300 mg each night? On the 15th, does she increase the Clozapine to 100 mg in a month. She wants to clarify that she heard this right and these are the right directions. Please call Albert [name], his dad at [phone].   [facility] - [place], [place] - [address] Phone: [phone]  Fax: [phone]

## 2022-08-11 ENCOUNTER — Telehealth: Payer: Self-pay | Admitting: Psychiatry

## 2022-08-11 ENCOUNTER — Other Ambulatory Visit: Payer: Self-pay | Admitting: Psychiatry

## 2022-08-11 ENCOUNTER — Ambulatory Visit (HOSPITAL_COMMUNITY)
Admission: EM | Admit: 2022-08-11 | Discharge: 2022-08-12 | Disposition: A | Discharge status: Home / Self Care | Payer: Medicare PPO | Attending: Psychiatry | Admitting: Psychiatry

## 2022-08-11 DIAGNOSIS — F410 Panic disorder [episodic paroxysmal anxiety] without agoraphobia: Secondary | ICD-10-CM | POA: Insufficient documentation

## 2022-08-11 DIAGNOSIS — Z79899 Other long term (current) drug therapy: Secondary | ICD-10-CM | POA: Insufficient documentation

## 2022-08-11 DIAGNOSIS — F319 Bipolar disorder, unspecified: Secondary | ICD-10-CM

## 2022-08-11 DIAGNOSIS — R45850 Homicidal ideations: Secondary | ICD-10-CM | POA: Diagnosis not present

## 2022-08-11 DIAGNOSIS — F314 Bipolar disorder, current episode depressed, severe, without psychotic features: Secondary | ICD-10-CM

## 2022-08-11 DIAGNOSIS — Z1152 Encounter for screening for COVID-19: Secondary | ICD-10-CM | POA: Insufficient documentation

## 2022-08-11 DIAGNOSIS — R45851 Suicidal ideations: Secondary | ICD-10-CM | POA: Diagnosis not present

## 2022-08-11 DIAGNOSIS — F411 Generalized anxiety disorder: Secondary | ICD-10-CM | POA: Diagnosis not present

## 2022-08-11 NOTE — Progress Notes (Signed)
   08/11/22 2243  [facility] Triage Screening (Walk-ins at [facility] only)  What Is the Reason for Your Visit/Call Today? Pt presents to [facility] voluntarily, accompanied by his brother due to diagnosis of Bipolar I and fleeting thoughts of HI towards his mother/father with no plan or intent. Pt reports experiencing intrusive thoughts and panics that he may wake up one night and do something he regrets. Pt denies any plan for SI, no access to guns, has never attempted suicide in the past. Pt has been hospitalized for SI once a couple of years ago. Pt currently denies AVH and substance/alcohol use.  How Long Has This Been Causing You Problems? 1 wk - 1 month  Have You Recently Had Any Thoughts About Hurting Yourself? No  How long ago did you have thoughts about hurting yourself? pt denies  Are You Planning to Commit Suicide/Harm Yourself At This time? No  Have you Recently Had Thoughts About Hurting Someone Karolee Ohs? Yes  How long ago did you have thoughts of harming others? fleeting thoughts about harming his family  Are You Planning To Harm Someone At This Time? No  Explanation: pt denies  Are you currently experiencing any auditory, visual or other hallucinations? No  Have You Used Any Alcohol or Drugs in the Past 24 Hours? No  What Did You Use and How Much? pt denies  Do you have any current medical co-morbidities that require immediate attention? No  Clinician description of patient physical appearance/behavior: pt presents dressed casually, cooperative, pleasant  What Do You Feel Would Help You the Most Today? Medication(s);Treatment for Depression or other mood problem  If access to [facility] was not available, would you have sought care in the Emergency Department? Yes  Determination of Need Urgent (48 hours)  Options For Referral Other: Comment;BH Urgent Care;Outpatient Therapy

## 2022-08-11 NOTE — Telephone Encounter (Signed)
Received after hours call from pt's mother. She reports that they have started Clozapine. She reports that this evening SI and HI have been worse. She reports that he has been taking Clozapine 25 mg po QHS the last 2 nights with plan to increase to 2 tabs po QHS. Mother reports, "he wants to be safe and he wants everyone else to be safe." She reports that they would prefer to take patient somewhere other than [facility] or [facility]. Discussed alternatives, to include [facility] or [facility]. Provided address and phone number for [facility]. She reports that pt's brother has possibly contacted a mobile crisis unit in their area for assistance. She reports that they will consider these options and likely take patient for emergency assessment tonight.   She reports that pt had labs drawn today for CBC and that pharmacist requested lab results before filling 100 mg tabs. Recommended taking Clozapine dose this evening and bringing medication with them to possibly prevent a  in treatment.   Mother requested that Dr. Jennelle Human be made aware of this information. Information routed to Dr. Jennelle Human.

## 2022-08-11 NOTE — ED Provider Notes (Signed)
[facility] Continuous Assessment Admission H&P  Date: 08/12/22 Patient Name: Albert [name] MRN: [id number] Chief Complaint: having Homicidal thoughts toward is mother  Diagnoses:  Final diagnoses:  Homicidal thoughts  Suicidal thoughts  Bipolar I disorder    HPI: Albert [name],  57 y/o male with a with a history of bipolar disorder, depression, and anxiety, [facility] accompanied by his brother.  Per the patient he has bipolar disorder 1 and has been having suicidal and homicidal thoughts.  According to the patient he currently see a psychiatrist [facility] [address] and was started this week on clozapine.  Patient report he was hospitalized 2-1/2 years ago for suicidal ideation.  According to patient he is current and all his medicines.  Patient reports experiencing intrusive thoughts and panic that wakes him up sometimes at night.   Triage notes: Albert [name]-[facility], accompanied by his brother due to diagnosis of Bipolar I and fleeting thoughts of HI towards his mother/father with no plan or intent. Pt reports experiencing intrusive thoughts and panics that he may wake up one night and do something he regrets. Pt denies any plan for SI, no access to guns, has never attempted suicide in the past. Pt has been hospitalized for SI once a couple of years ago. Pt currently denies AVH and substance/alcohol use.  Face-to-face observation of patient, patient is alert and oriented x 4, speech is clear, maintaining eye contact.  Patient is anxious affect is flat congruent with mood.  Patient reports suicidal thoughts with no plans and HI with no plan patient described it as an intrusive thoughts.  Patient denies alcohol use, denies smoking or illicit drug use.  Patient denies access to guns.  Will continue pt home medications.   Recommend inpatient observation    Total Time spent with patient: 20 minutes  Musculoskeletal  Strength & Muscle Tone: within normal limits Gait &  Station: normal Patient leans: N/A  Psychiatric Specialty Exam  Presentation General Appearance:  Casual  Eye Contact: Good  Speech: Clear and Coherent  Speech Volume: Normal  Handedness: Right   Mood and Affect  Mood: Anxious  Affect: Congruent   Thought Process  Thought Processes: Coherent  Descriptions of Associations:Circumstantial  Orientation:Full (Time, Place and Person)  Thought Content:WDL  Diagnosis of Schizophrenia or Schizoaffective disorder in past: No   Hallucinations:Hallucinations: None  Ideas of Reference:None  Suicidal Thoughts:Suicidal Thoughts: Yes, Passive SI Active Intent and/or Plan: Without Plan  Homicidal Thoughts:Homicidal Thoughts: Yes, Passive HI Passive Intent and/or Plan: Without Intent; Without Plan   Sensorium  Memory: Immediate Fair  Judgment: Fair  Insight: Fair   Chartered certified accountantxecutive Functions  Concentration: Good  Attention Span: Good  Recall: Good  Fund of Knowledge: Good  Language: Good   Psychomotor Activity  Psychomotor Activity: Psychomotor Activity: Normal   Assets  Assets: Desire for Improvement   Sleep  Sleep: Sleep: Fair   Nutritional Assessment (For OBS and FBC admissions only) Has the patient had a weight loss or gain of 10 pounds or more in the last 3 months?: No Has the patient had a decrease in food intake/or appetite?: No Does the patient have dental problems?: No Does the patient have eating habits or behaviors that may be indicators of an eating disorder including binging or inducing vomiting?: No Has the patient recently lost weight without trying?: 0 Has the patient been eating poorly because of a decreased appetite?: 0 Malnutrition Screening Tool Score: 0    Physical Exam HENT:  Head: Normocephalic.     Nose: Nose normal.  Cardiovascular:     Rate and Rhythm: Normal rate.  Pulmonary:     Effort: Pulmonary effort is normal.  Musculoskeletal:        General:  Normal range of motion.     Cervical back: Normal range of motion.  Neurological:     General: No focal deficit present.     Mental Status: He is alert.  Psychiatric:        Mood and Affect: Mood normal.        Behavior: Behavior normal.        Thought Content: Thought content normal.        Judgment: Judgment normal.    Review of Systems  Constitutional: Negative.   HENT: Negative.    Eyes: Negative.   Respiratory: Negative.    Cardiovascular: Negative.   Gastrointestinal: Negative.   Genitourinary: Negative.   Musculoskeletal: Negative.   Skin: Negative.   Neurological: Negative.   Psychiatric/Behavioral:  Positive for depression and suicidal ideas. The patient is nervous/anxious.     Blood pressure 126/89, pulse 93, temperature 98.7 F (37.1 C), temperature source Oral, resp. rate 18, SpO2 98 %. There is no height or weight on file to calculate BMI.  Past Psychiatric History: Bipolar disorder, depression, anxiety, suicidal ideation  Is the patient at risk to self? Yes  Has the patient been a risk to self in the past 6 months? Yes .    Has the patient been a risk to self within the distant past? Yes   Is the patient a risk to others? Yes   Has the patient been a risk to others in the past 6 months? Yes   Has the patient been a risk to others within the distant past? Yes   Past Medical History: See chart  Family History: Unknown  Social History: None  Last Labs:  Admission on 08/11/2022  Component Date Value Ref Range Status   WBC 08/12/2022 6.8  4.0 - 10.5 K/uL Final   RBC 08/12/2022 3.72 (L)  4.22 - 5.81 MIL/uL Final   Hemoglobin 08/12/2022 11.7 (L)  13.0 - 17.0 g/dL Final   HCT 16/02/9603 35.0 (L)  39.0 - 52.0 % Final   MCV 08/12/2022 94.1  80.0 - 100.0 fL Final   MCH 08/12/2022 31.5  26.0 - 34.0 pg Final   MCHC 08/12/2022 33.4  30.0 - 36.0 g/dL Final   RDW 54/01/8118 12.4  11.5 - 15.5 % Final   Platelets 08/12/2022 467 (H)  150 - 400 K/uL Final   nRBC  08/12/2022 0.0  0.0 - 0.2 % Final   Neutrophils Relative % 08/12/2022 45  % Final   Neutro Abs 08/12/2022 3.0  1.7 - 7.7 K/uL Final   Lymphocytes Relative 08/12/2022 39  % Final   Lymphs Abs 08/12/2022 2.7  0.7 - 4.0 K/uL Final   Monocytes Relative 08/12/2022 8  % Final   Monocytes Absolute 08/12/2022 0.5  0.1 - 1.0 K/uL Final   Eosinophils Relative 08/12/2022 7  % Final   Eosinophils Absolute 08/12/2022 0.5  0.0 - 0.5 K/uL Final   Basophils Relative 08/12/2022 1  % Final   Basophils Absolute 08/12/2022 0.1  0.0 - 0.1 K/uL Final   Immature Granulocytes 08/12/2022 0  % Final   Abs Immature Granulocytes 08/12/2022 0.02  0.00 - 0.07 K/uL Final   Performed at [facility], [address], [place], [place] [address]   Sodium 08/12/2022  133 (L)  135 - 145 mmol/L Final   Potassium 08/12/2022 3.9  3.5 - 5.1 mmol/L Final   Chloride 08/12/2022 103  98 - 111 mmol/L Final   CO2 08/12/2022 19 (L)  22 - 32 mmol/L Final   Glucose, Bld 08/12/2022 101 (H)  70 - 99 mg/dL Final   Glucose reference range applies only to samples taken after fasting for at least 8 hours.   BUN 08/12/2022 14  6 - 20 mg/dL Final   Creatinine, Ser 08/12/2022 1.85 (H)  0.61 - 1.24 mg/dL Final   Calcium 40/98/1191 9.6  8.9 - 10.3 mg/dL Final   Total Protein 47/82/9562 6.7  6.5 - 8.1 g/dL Final   Albumin 13/12/6576 4.2  3.5 - 5.0 g/dL Final   AST 46/96/2952 18  15 - 41 U/L Final   ALT 08/12/2022 14  0 - 44 U/L Final   Alkaline Phosphatase 08/12/2022 24 (L)  38 - 126 U/L Final   Total Bilirubin 08/12/2022 0.5  0.3 - 1.2 mg/dL Final   GFR, Estimated 08/12/2022 42 (L)  >60 mL/min Final   Comment: (NOTE) Calculated using the CKD-EPI Creatinine Equation (2021)    Anion gap 08/12/2022 11  5 - 15 Final   Performed at [facility], [address] [place], [place] [address]   Hgb A1c MFr Bld 08/12/2022 5.4  4.8 - 5.6 % Final   Comment: (NOTE) Pre diabetes:          5.7%-6.4%  Diabetes:              >6.4%  Glycemic  control for   <7.0% adults with diabetes    Mean Plasma Glucose 08/12/2022 108.28  mg/dL Final   Performed at [facility], [address], [place], [place] [address]   Cholesterol 08/12/2022 155  0 - 200 mg/dL Final   Triglycerides 27/25/3664 146  <150 mg/dL Final   HDL 40/34/7425 60  >40 mg/dL Final   Total CHOL/HDL Ratio 08/12/2022 2.6  RATIO Final   VLDL 08/12/2022 29  0 - 40 mg/dL Final   LDL Cholesterol 08/12/2022 66  0 - 99 mg/dL Final   Comment:        Total Cholesterol/HDL:CHD Risk Coronary Heart Disease Risk Table                     Men   Women  1/2 Average Risk   3.4   3.3  Average Risk       5.0   4.4  2 X Average Risk   9.6   7.1  3 X Average Risk  23.4   11.0        Use the calculated Patient Ratio above and the CHD Risk Table to determine the patient's CHD Risk.        ATP III CLASSIFICATION (LDL):  <100     mg/dL   Optimal  956-387  mg/dL   Near or Above                    Optimal  130-159  mg/dL   Borderline  564-332  mg/dL   High  >951     mg/dL   Very High Performed at [facility], [address], [place], [place] [address]    TSH 08/12/2022 0.699  0.350 - 4.500 uIU/mL Final   Comment: Performed by a 3rd Generation assay with a functional sensitivity of <=0.01 uIU/mL. Performed at [facility], [address] [place], [place] [address]    POC Amphetamine UR 08/12/2022 None Detected  NONE DETECTED (Cut Off Level 1000 ng/mL) Preliminary   POC Secobarbital (BAR) 08/12/2022 None Detected  NONE DETECTED (Cut Off Level 300 ng/mL) Preliminary   POC Buprenorphine (BUP) 08/12/2022 None Detected  NONE DETECTED (Cut Off Level 10 ng/mL) Preliminary   POC Oxazepam (BZO) 08/12/2022 Positive (A)  NONE DETECTED (Cut Off Level 300 ng/mL) Preliminary   POC Cocaine UR 08/12/2022 None Detected  NONE DETECTED (Cut Off Level 300 ng/mL) Preliminary   POC Methamphetamine UR 08/12/2022 None Detected  NONE DETECTED (Cut Off Level 1000 ng/mL) Preliminary   POC  Morphine 08/12/2022 None Detected  NONE DETECTED (Cut Off Level 300 ng/mL) Preliminary   POC Methadone UR 08/12/2022 None Detected  NONE DETECTED (Cut Off Level 300 ng/mL) Preliminary   POC Oxycodone UR 08/12/2022 None Detected  NONE DETECTED (Cut Off Level 100 ng/mL) Preliminary   POC Marijuana UR 08/12/2022 None Detected  NONE DETECTED (Cut Off Level 50 ng/mL) Preliminary   SARSCOV2ONAVIRUS 2 AG 08/12/2022 NEGATIVE  NEGATIVE Final   Comment: (NOTE) SARS-CoV-2 antigen NOT DETECTED.   Negative results are presumptive.  Negative results do not preclude SARS-CoV-2 infection and should not be used as the sole basis for treatment or other patient management decisions, including infection  control decisions, particularly in the presence of clinical signs and  symptoms consistent with COVID-19, or in those who have been in contact with the virus.  Negative results must be combined with clinical observations, patient history, and epidemiological information. The expected result is Negative.  Fact Sheet for Patients: https://www.jennings-kim.com/  Fact Sheet for Healthcare Providers: https://alexander-rogers.biz/  This test is not yet approved or cleared by the [place] FDA and  has been authorized for detection and/or diagnosis of SARS-CoV-2 by FDA under an Emergency Use Authorization (EUA).  This EUA will remain in effect (meaning this test can be used) for the duration of  the COV                          ID-19 declaration under Section 564(b)(1) of the Act, 21 U.S.C. section 360bbb-3(b)(1), unless the authorization is terminated or revoked sooner.     SARS Coronavirus 2 by RT PCR 08/12/2022 NEGATIVE  NEGATIVE Final   Performed at [facility], [address] [place], [place] [address]  Office Visit on 08/05/2022  Component Date Value Ref Range Status   Glucose 08/05/2022 92  70 - 99 mg/dL Final   BUN 73/57/8978 15  6 - 24 mg/dL Final   Creatinine,  Ser 08/05/2022 1.94 (H)  0.76 - 1.27 mg/dL Final   eGFR 47/84/1282 39 (L)  >59 mL/min/1.73 Final   BUN/Creatinine Ratio 08/05/2022 8 (L)  9 - 20 Final   Sodium 08/05/2022 138  134 - 144 mmol/L Final   Potassium 08/05/2022 4.4  3.5 - 5.2 mmol/L Final   Chloride 08/05/2022 104  96 - 106 mmol/L Final   CO2 08/05/2022 18 (L)  20 - 29 mmol/L Final   Calcium 08/05/2022 10.2  8.7 - 10.2 mg/dL Final   Lithium Lvl 12/13/8869 1.1  0.5 - 1.2 mmol/L Final   Comment: A concentration of 0.5-0.8 mmol/L is advised for long-term use; concentrations of up to 1.2 mmol/L may be necessary during acute treatment.  Detection Limit = 0.1                           <0.1 indicates None Detected   Admission on 08/01/2022, Discharged on 08/02/2022  Component Date Value Ref Range Status   WBC 08/01/2022 6.3  4.0 - 10.5 K/uL Final   RBC 08/01/2022 3.69 (L)  4.22 - 5.81 MIL/uL Final   Hemoglobin 08/01/2022 11.6 (L)  13.0 - 17.0 g/dL Final   HCT 09/81/1914 35.8 (L)  39.0 - 52.0 % Final   MCV 08/01/2022 97.0  80.0 - 100.0 fL Final   MCH 08/01/2022 31.4  26.0 - 34.0 pg Final   MCHC 08/01/2022 32.4  30.0 - 36.0 g/dL Final   RDW 78/29/5621 12.7  11.5 - 15.5 % Final   Platelets 08/01/2022 376  150 - 400 K/uL Final   nRBC 08/01/2022 0.0  0.0 - 0.2 % Final   Neutrophils Relative % 08/01/2022 58  % Final   Neutro Abs 08/01/2022 3.6  1.7 - 7.7 K/uL Final   Lymphocytes Relative 08/01/2022 27  % Final   Lymphs Abs 08/01/2022 1.7  0.7 - 4.0 K/uL Final   Monocytes Relative 08/01/2022 8  % Final   Monocytes Absolute 08/01/2022 0.5  0.1 - 1.0 K/uL Final   Eosinophils Relative 08/01/2022 6  % Final   Eosinophils Absolute 08/01/2022 0.4  0.0 - 0.5 K/uL Final   Basophils Relative 08/01/2022 1  % Final   Basophils Absolute 08/01/2022 0.1  0.0 - 0.1 K/uL Final   Immature Granulocytes 08/01/2022 0  % Final   Abs Immature Granulocytes 08/01/2022 0.02  0.00 - 0.07 K/uL Final   Performed at [facility], [address], [place], [place] [address]   Sodium 08/01/2022 137  135 - 145 mmol/L Final   Potassium 08/01/2022 4.4  3.5 - 5.1 mmol/L Final   Chloride 08/01/2022 107  98 - 111 mmol/L Final   CO2 08/01/2022 21 (L)  22 - 32 mmol/L Final   Glucose, Bld 08/01/2022 90  70 - 99 mg/dL Final   Glucose reference range applies only to samples taken after fasting for at least 8 hours.   BUN 08/01/2022 26 (H)  6 - 20 mg/dL Final   Creatinine, Ser 08/01/2022 2.35 (H)  0.61 - 1.24 mg/dL Final   Calcium 78/46/9629 9.8  8.9 - 10.3 mg/dL Final   Total Protein 52/84/1324 7.2  6.5 - 8.1 g/dL Final   Albumin 40/02/2724 4.7  3.5 - 5.0 g/dL Final   AST 36/64/4034 20  15 - 41 U/L Final   ALT 08/01/2022 14  0 - 44 U/L Final   Alkaline Phosphatase 08/01/2022 25 (L)  38 - 126 U/L Final   Total Bilirubin 08/01/2022 0.7  0.3 - 1.2 mg/dL Final   GFR, Estimated 08/01/2022 31 (L)  >60 mL/min Final   Comment: (NOTE) Calculated using the CKD-EPI Creatinine Equation (2021)    Anion gap 08/01/2022 9  5 - 15 Final   Performed at [facility], [address] [place], [place] [address]   Magnesium 08/01/2022 2.1  1.7 - 2.4 mg/dL Final   Performed at [facility], [address] [place], [place] [address]   Lithium Lvl 08/01/2022 1.13  0.60 - 1.20 mmol/L Final   Performed at [facility], [address], [place], [place] [address]   Alcohol, Ethyl (B) 08/01/2022 <10  <10 mg/dL Final  Comment: (NOTE) Lowest detectable limit for serum alcohol is 10 mg/dL.  For medical purposes only. Performed at [facility], [address], [place], [place] [address]    Opiates 08/01/2022 NONE DETECTED  NONE DETECTED Final   Cocaine 08/01/2022 NONE DETECTED  NONE DETECTED Final   Benzodiazepines 08/01/2022 POSITIVE (A)  NONE DETECTED Final   Amphetamines 08/01/2022 NONE DETECTED  NONE DETECTED Final   Tetrahydrocannabinol 08/01/2022  NONE DETECTED  NONE DETECTED Final   Barbiturates 08/01/2022 NONE DETECTED  NONE DETECTED Final   Comment: (NOTE) DRUG SCREEN FOR MEDICAL PURPOSES ONLY.  IF CONFIRMATION IS NEEDED FOR ANY PURPOSE, NOTIFY LAB WITHIN 5 DAYS.  LOWEST DETECTABLE LIMITS FOR URINE DRUG SCREEN Drug Class                     Cutoff (ng/mL) Amphetamine and metabolites    1000 Barbiturate and metabolites    200 Benzodiazepine                 200 Opiates and metabolites        300 Cocaine and metabolites        300 THC                            50 Performed at [facility], [address], [place], [place] [address]    Color, Urine 08/01/2022 STRAW (A)  YELLOW Final   APPearance 08/01/2022 CLEAR  CLEAR Final   Specific Gravity, Urine 08/01/2022 1.006  1.005 - 1.030 Final   pH 08/01/2022 6.0  5.0 - 8.0 Final   Glucose, UA 08/01/2022 NEGATIVE  NEGATIVE mg/dL Final   Hgb urine dipstick 08/01/2022 NEGATIVE  NEGATIVE Final   Bilirubin Urine 08/01/2022 NEGATIVE  NEGATIVE Final   Ketones, ur 08/01/2022 NEGATIVE  NEGATIVE mg/dL Final   Protein, ur 09/81/1914 NEGATIVE  NEGATIVE mg/dL Final   Nitrite 78/29/5621 NEGATIVE  NEGATIVE Final   Leukocytes,Ua 08/01/2022 NEGATIVE  NEGATIVE Final   Performed at [facility], [address] [place], [place] [address]   TSH 08/01/2022 0.060 (L)  0.350 - 4.500 uIU/mL Final   Comment: Performed by a 3rd Generation assay with a functional sensitivity of <=0.01 uIU/mL. Performed at [facility], [address] [place], [place] [address]    Free T4 08/01/2022 1.09  0.61 - 1.12 ng/dL Final   Comment: (NOTE) Biotin ingestion may interfere with free T4 tests. If the results are inconsistent with the TSH level, previous test results, or the clinical presentation, then consider biotin interference. If needed, order repeat testing after stopping biotin. Performed at [facility], [address], [place],  [place] [address]    Sodium 08/02/2022 137  135 - 145 mmol/L Final   Potassium 08/02/2022 4.6  3.5 - 5.1 mmol/L Final   Chloride 08/02/2022 109  98 - 111 mmol/L Final   CO2 08/02/2022 21 (L)  22 - 32 mmol/L Final   Glucose, Bld 08/02/2022 97  70 - 99 mg/dL Final   Glucose reference range applies only to samples taken after fasting for at least 8 hours.   BUN 08/02/2022 23 (H)  6 - 20 mg/dL Final   Creatinine, Ser 08/02/2022 2.08 (H)  0.61 - 1.24 mg/dL Final   Calcium 84/13/2440 9.8  8.9 - 10.3 mg/dL Final   GFR, Estimated 08/02/2022 36 (L)  >60 mL/min Final   Comment: (NOTE) Calculated using the  CKD-EPI Creatinine Equation (2021)    Anion gap 08/02/2022 7  5 - 15 Final   Performed at [facility], [address] [place], [place] [address]   Lithium Lvl 08/02/2022 1.04  0.60 - 1.20 mmol/L Final   Performed at [facility], [address], [place], [place] [address]  Orders Only on 07/28/2022  Component Date Value Ref Range Status   Lithium Lvl 07/29/2022 1.4 (HH)  0.5 - 1.2 mmol/L Final   Comment: A concentration of 0.5-0.8 mmol/L is advised for long-term use; concentrations of up to 1.2 mmol/L may be necessary during acute treatment.                                  Detection Limit = 0.1                           <0.1 indicates None Detected Patient drug level exceeds published reference range.  Evaluate clinically for signs of potential toxicity.    Glucose 07/29/2022 93  70 - 99 mg/dL Final   BUN 09/81/1914 26 (H)  6 - 24 mg/dL Final   Creatinine, Ser 07/29/2022 4.11 (H)  0.76 - 1.27 mg/dL Final   eGFR 78/29/5621 16 (L)  >59 mL/min/1.73 Final   BUN/Creatinine Ratio 07/29/2022 6 (L)  9 - 20 Final   Sodium 07/29/2022 135  134 - 144 mmol/L Final   Potassium 07/29/2022 4.9  3.5 - 5.2 mmol/L Final   Chloride 07/29/2022 100  96 - 106 mmol/L Final   CO2 07/29/2022 20  20 - 29 mmol/L Final   Calcium 07/29/2022 10.1  8.7 - 10.2 mg/dL Final   TSH  30/86/5784 0.289 (L)  0.450 - 4.500 uIU/mL Final    Allergies: Meloxicam, Nsaids, Ibuprofen, Prednisone, and Wellbutrin [bupropion]  Medications:  Facility Ordered Medications  Medication   acetaminophen (TYLENOL) tablet 650 mg   alum & mag hydroxide-simeth (MAALOX/MYLANTA) 200-200-20 MG/5ML suspension 30 mL   magnesium hydroxide (MILK OF MAGNESIA) suspension 30 mL   OLANZapine zydis (ZYPREXA) disintegrating tablet 5 mg   And   LORazepam (ATIVAN) tablet 1 mg   And   ziprasidone (GEODON) injection 20 mg   tamsulosin (FLOMAX) capsule 0.4 mg   QUEtiapine (SEROQUEL) tablet 200 mg   polycarbophil (FIBERCON) tablet 1,250 mg   meclizine (ANTIVERT) tablet 25 mg   lithium carbonate capsule 300 mg   lisinopril (ZESTRIL) tablet 5 mg   levothyroxine (SYNTHROID) tablet 75 mcg   fluticasone (FLONASE) 50 MCG/ACT nasal spray 1 spray   pantoprazole (PROTONIX) EC tablet 40 mg   OLANZapine (ZYPREXA) tablet 10 mg   OLANZapine (ZYPREXA) tablet 20 mg   PTA Medications  Medication Sig   levothyroxine (SYNTHROID, LEVOTHROID) 75 MCG tablet Take 75 mcg by mouth daily before breakfast.   fenofibrate (TRICOR) 145 MG tablet Take 145 mg by mouth daily.   lisinopril (ZESTRIL) 5 MG tablet Take 5 mg by mouth daily.   acetaminophen (TYLENOL) 500 MG tablet Take 1,000 mg by mouth every 6 (six) hours as needed for moderate pain.   hydrocortisone (ANUSOL-HC) 2.5 % rectal cream Place 1 application rectally 3 (three) times daily as needed for hemorrhoids.   fluticasone (FLONASE) 50 MCG/ACT nasal spray Place 1 spray into both nostrils daily as needed for allergies or rhinitis.   polycarbophil (FIBERCON) 625 MG tablet Take 1,250 mg by mouth daily.  meclizine (ANTIVERT) 25 MG tablet Take 1 tablet (25 mg total) by mouth 3 (three) times daily as needed for dizziness.   sildenafil (VIAGRA) 100 MG tablet Take 1 tablet (100 mg total) by mouth daily as needed for erectile dysfunction.   tamsulosin (FLOMAX) 0.4 MG CAPS  capsule Take 1 capsule (0.4 mg total) by mouth daily.   LORazepam (ATIVAN) 0.5 MG tablet TAKE ONE TABLET BY MOUTH FOUR TIMES DAILY   bisacodyl (DULCOLAX) 10 MG suppository Place 1 suppository (10 mg total) rectally as needed for moderate constipation.   OLANZapine (ZYPREXA) 10 MG tablet TAKE 1 TABLET BY MOUTH AT BEDTIME   QUEtiapine (SEROQUEL) 200 MG tablet TAKE 1 TABLET BY MOUTH AT BEDTIME   OLANZapine (ZYPREXA) 20 MG tablet TAKE ONE TABLET BY MOUTH AT BEDTIME   esomeprazole (NEXIUM) 40 MG capsule Take 40 mg by mouth daily.   WEGOVY 1 MG/0.5ML SOAJ Inject 1 mg into the skin once a week.   lithium carbonate 300 MG capsule Take 1 capsule (300 mg total) by mouth at bedtime.   cloZAPine (CLOZARIL) 25 MG tablet 1 tablet at night for 2 nights then 2 at night for 2 nights then 3 at night    Medical Decision Making  Inpatient observation   Meds ordered this encounter  Medications   acetaminophen (TYLENOL) tablet 650 mg   alum & mag hydroxide-simeth (MAALOX/MYLANTA) 200-200-20 MG/5ML suspension 30 mL   magnesium hydroxide (MILK OF MAGNESIA) suspension 30 mL   AND Linked Order Group    OLANZapine zydis (ZYPREXA) disintegrating tablet 5 mg    LORazepam (ATIVAN) tablet 1 mg    ziprasidone (GEODON) injection 20 mg   tamsulosin (FLOMAX) capsule 0.4 mg   QUEtiapine (SEROQUEL) tablet 200 mg   polycarbophil (FIBERCON) tablet 1,250 mg   meclizine (ANTIVERT) tablet 25 mg   lithium carbonate capsule 300 mg   lisinopril (ZESTRIL) tablet 5 mg   levothyroxine (SYNTHROID) tablet 75 mcg   fluticasone (FLONASE) 50 MCG/ACT nasal spray 1 spray   pantoprazole (PROTONIX) EC tablet 40 mg   OLANZapine (ZYPREXA) tablet 10 mg   OLANZapine (ZYPREXA) tablet 20 mg    Lab Orders         SARS Coronavirus 2 by RT PCR (hospital order, performed in [facility] hospital lab) *cepheid single result test*         CBC with Differential/Platelet         Comprehensive metabolic panel         Hemoglobin A1c         Lipid  panel         TSH         POCT Urine Drug Screen - (I-Screen)         POC SARS Coronavirus 2 Ag      Recommendations  Based on my evaluation the patient appears to have an emergency medical condition for which I recommend the patient be transferred to the emergency department for further evaluation.  Sindy Guadeloupe, NP 08/12/22  5:14 AM

## 2022-08-12 ENCOUNTER — Other Ambulatory Visit: Payer: Self-pay | Admitting: Psychiatry

## 2022-08-12 ENCOUNTER — Telehealth: Payer: Self-pay | Admitting: Psychiatry

## 2022-08-12 DIAGNOSIS — F314 Bipolar disorder, current episode depressed, severe, without psychotic features: Secondary | ICD-10-CM

## 2022-08-12 DIAGNOSIS — R45850 Homicidal ideations: Secondary | ICD-10-CM

## 2022-08-12 DIAGNOSIS — Z0283 Encounter for blood-alcohol and blood-drug test: Secondary | ICD-10-CM

## 2022-08-12 DIAGNOSIS — R45851 Suicidal ideations: Secondary | ICD-10-CM | POA: Diagnosis not present

## 2022-08-12 DIAGNOSIS — F319 Bipolar disorder, unspecified: Secondary | ICD-10-CM | POA: Diagnosis not present

## 2022-08-12 LAB — CBC WITH DIFFERENTIAL/PLATELET
Abs Immature Granulocytes: 0.02 10*3/uL (ref 0.00–0.07)
Basophils Absolute: 0.1 10*3/uL (ref 0.0–0.1)
Basophils Absolute: 0.1 10*3/uL (ref 0.0–0.2)
Basophils Relative: 1 %
Basos: 1 %
EOS (ABSOLUTE): 0.3 10*3/uL (ref 0.0–0.4)
Eos: 5 %
Eosinophils Absolute: 0.5 10*3/uL (ref 0.0–0.5)
Eosinophils Relative: 7 %
HCT: 35 % — ABNORMAL LOW (ref 39.0–52.0)
Hematocrit: 36.1 % — ABNORMAL LOW (ref 37.5–51.0)
Hemoglobin: 11.7 g/dL — ABNORMAL LOW (ref 13.0–17.0)
Hemoglobin: 12.1 g/dL — ABNORMAL LOW (ref 13.0–17.7)
Immature Grans (Abs): 0 10*3/uL (ref 0.0–0.1)
Immature Granulocytes: 0 %
Immature Granulocytes: 0 %
Lymphocytes Absolute: 1.9 10*3/uL (ref 0.7–3.1)
Lymphocytes Relative: 39 %
Lymphs Abs: 2.7 10*3/uL (ref 0.7–4.0)
Lymphs: 29 %
MCH: 31.5 pg (ref 26.0–34.0)
MCH: 30.9 pg (ref 26.6–33.0)
MCHC: 33.4 g/dL (ref 30.0–36.0)
MCHC: 33.5 g/dL (ref 31.5–35.7)
MCV: 94.1 fL (ref 80.0–100.0)
MCV: 92 fL (ref 79–97)
Monocytes Absolute: 0.5 10*3/uL (ref 0.1–1.0)
Monocytes Absolute: 0.5 10*3/uL (ref 0.1–0.9)
Monocytes Relative: 8 %
Monocytes: 7 %
Neutro Abs: 3 10*3/uL (ref 1.7–7.7)
Neutrophils Absolute: 3.9 10*3/uL (ref 1.4–7.0)
Neutrophils Relative %: 45 %
Neutrophils: 58 %
Platelets: 467 10*3/uL — ABNORMAL HIGH (ref 150–400)
Platelets: 496 10*3/uL — ABNORMAL HIGH (ref 150–450)
RBC: 3.72 MIL/uL — ABNORMAL LOW (ref 4.22–5.81)
RBC: 3.91 x10E6/uL — ABNORMAL LOW (ref 4.14–5.80)
RDW: 12.4 % (ref 11.5–15.5)
RDW: 11.6 % (ref 11.6–15.4)
WBC: 6.8 10*3/uL (ref 4.0–10.5)
WBC: 6.6 10*3/uL (ref 3.4–10.8)
nRBC: 0 % (ref 0.0–0.2)

## 2022-08-12 LAB — POC SARS CORONAVIRUS 2 AG: SARSCOV2ONAVIRUS 2 AG: NEGATIVE

## 2022-08-12 LAB — COMPREHENSIVE METABOLIC PANEL
ALT: 14 U/L (ref 0–44)
AST: 18 U/L (ref 15–41)
Albumin: 4.2 g/dL (ref 3.5–5.0)
Alkaline Phosphatase: 24 U/L — ABNORMAL LOW (ref 38–126)
Anion gap: 11 (ref 5–15)
BUN: 14 mg/dL (ref 6–20)
CO2: 19 mmol/L — ABNORMAL LOW (ref 22–32)
Calcium: 9.6 mg/dL (ref 8.9–10.3)
Chloride: 103 mmol/L (ref 98–111)
Creatinine, Ser: 1.85 mg/dL — ABNORMAL HIGH (ref 0.61–1.24)
GFR, Estimated: 42 mL/min — ABNORMAL LOW (ref >60)
Glucose, Bld: 101 mg/dL — ABNORMAL HIGH (ref 70–99)
Potassium: 3.9 mmol/L (ref 3.5–5.1)
Sodium: 133 mmol/L — ABNORMAL LOW (ref 135–145)
Total Bilirubin: 0.5 mg/dL (ref 0.3–1.2)
Total Protein: 6.7 g/dL (ref 6.5–8.1)

## 2022-08-12 LAB — LIPID PANEL
Cholesterol: 155 mg/dL (ref 0–200)
HDL: 60 mg/dL (ref >40)
LDL Cholesterol: 66 mg/dL (ref 0–99)
Total CHOL/HDL Ratio: 2.6 RATIO
Triglycerides: 146 mg/dL (ref <150)
VLDL: 29 mg/dL (ref 0–40)

## 2022-08-12 LAB — POCT URINE DRUG SCREEN - MANUAL ENTRY (I-SCREEN)
POC Amphetamine UR: NOT DETECTED
POC Buprenorphine (BUP): NOT DETECTED
POC Cocaine UR: NOT DETECTED
POC Marijuana UR: NOT DETECTED
POC Methadone UR: NOT DETECTED
POC Methamphetamine UR: NOT DETECTED
POC Morphine: NOT DETECTED
POC Oxazepam (BZO): POSITIVE — AB
POC Oxycodone UR: NOT DETECTED
POC Secobarbital (BAR): NOT DETECTED

## 2022-08-12 LAB — HEMOGLOBIN A1C
Hgb A1c MFr Bld: 5.4 % (ref 4.8–5.6)
Mean Plasma Glucose: 108.28 mg/dL

## 2022-08-12 LAB — SARS CORONAVIRUS 2 BY RT PCR: SARS Coronavirus 2 by RT PCR: NEGATIVE

## 2022-08-12 LAB — TSH: TSH: 0.699 u[IU]/mL (ref 0.350–4.500)

## 2022-08-12 MED ORDER — MAGNESIUM HYDROXIDE 400 MG/5ML PO SUSP: 30.0000 mL | Freq: Every day | ORAL | Status: DC | PRN

## 2022-08-12 MED ORDER — LITHIUM CARBONATE 300 MG PO CAPS
300.0000 mg | ORAL_CAPSULE | Freq: Every day | ORAL | Status: DC
  Administered 2022-08-12: 300 mg via ORAL
  Filled 2022-08-12: qty 1

## 2022-08-12 MED ORDER — ACETAMINOPHEN 325 MG PO TABS: 650.0000 mg | ORAL_TABLET | Freq: Four times a day (QID) | ORAL | Status: DC | PRN

## 2022-08-12 MED ORDER — ALUM & MAG HYDROXIDE-SIMETH 200-200-20 MG/5ML PO SUSP: 30.0000 mL | ORAL | Status: DC | PRN

## 2022-08-12 MED ORDER — PANTOPRAZOLE SODIUM 40 MG PO TBEC
40.0000 mg | DELAYED_RELEASE_TABLET | Freq: Every day | ORAL | Status: DC
  Administered 2022-08-12: 40 mg via ORAL
  Filled 2022-08-12: qty 1

## 2022-08-12 MED ORDER — FLUTICASONE PROPIONATE 50 MCG/ACT NA SUSP: 1.0000 | Freq: Every day | NASAL | Status: DC | PRN

## 2022-08-12 MED ORDER — OLANZAPINE 10 MG PO TABS
20.0000 mg | ORAL_TABLET | Freq: Every day | ORAL | Status: DC
  Administered 2022-08-12: 20 mg via ORAL
  Filled 2022-08-12: qty 2

## 2022-08-12 MED ORDER — LORAZEPAM 1 MG PO TABS: 1.0000 mg | ORAL_TABLET | ORAL | Status: DC | PRN

## 2022-08-12 MED ORDER — ZIPRASIDONE MESYLATE 20 MG IM SOLR: 20.0000 mg | INTRAMUSCULAR | Status: DC | PRN

## 2022-08-12 MED ORDER — OLANZAPINE 10 MG PO TABS
10.0000 mg | ORAL_TABLET | Freq: Every day | ORAL | Status: DC
  Administered 2022-08-12: 10 mg via ORAL
  Filled 2022-08-12: qty 1

## 2022-08-12 MED ORDER — MECLIZINE HCL 12.5 MG PO TABS: 25.0000 mg | ORAL_TABLET | Freq: Three times a day (TID) | ORAL | Status: DC | PRN

## 2022-08-12 MED ORDER — LISINOPRIL 5 MG PO TABS
5.0000 mg | ORAL_TABLET | Freq: Every day | ORAL | Status: DC
  Administered 2022-08-12: 5 mg via ORAL
  Filled 2022-08-12: qty 1

## 2022-08-12 MED ORDER — QUETIAPINE FUMARATE 200 MG PO TABS
200.0000 mg | ORAL_TABLET | Freq: Every day | ORAL | Status: DC
  Administered 2022-08-12: 200 mg via ORAL
  Filled 2022-08-12: qty 1

## 2022-08-12 MED ORDER — OLANZAPINE 5 MG PO TBDP: 5.0000 mg | ORAL_TABLET | Freq: Three times a day (TID) | ORAL | Status: DC | PRN

## 2022-08-12 MED ORDER — TAMSULOSIN HCL 0.4 MG PO CAPS
0.4000 mg | ORAL_CAPSULE | Freq: Every day | ORAL | Status: DC
  Administered 2022-08-12: 0.4 mg via ORAL
  Filled 2022-08-12: qty 1

## 2022-08-12 MED ORDER — CLOZAPINE 100 MG PO TABS
100.0000 mg | ORAL_TABLET | Freq: Every day | ORAL | 0 refills | Status: DC
Start: 2022-08-12 — End: 2022-08-19

## 2022-08-12 MED ORDER — LEVOTHYROXINE SODIUM 75 MCG PO TABS
75.0000 ug | ORAL_TABLET | Freq: Every day | ORAL | Status: DC
  Administered 2022-08-12: 75 ug via ORAL
  Filled 2022-08-12: qty 1

## 2022-08-12 MED ORDER — CALCIUM POLYCARBOPHIL 625 MG PO TABS
1250.0000 mg | ORAL_TABLET | Freq: Every day | ORAL | Status: DC
  Administered 2022-08-12: 1250 mg via ORAL
  Filled 2022-08-12 (×2): qty 2

## 2022-08-12 NOTE — BH Assessment (Addendum)
Comprehensive Clinical Assessment (CCA) Note  08/12/2022 Albert [name] [id number] Disposition: Pt was brought in by his brother.  Pt seen by Lambert ModyMashauna Carter, NT for triage.  This clinician completed the CCA.  Patient MSE completed by Sindy Guadeloupeoy Williams, NP.  Roy recommended continuous assessment at [facility].  Pt is tense and anxious.  He has normal eye contact but is tapping his leg and worries about not having had his night meds yet.  Pt is oriented x4.  He is not responding to internal stimuli at this time.  He does not evidence any delusional thought process.  Patient reports not sleeping well and getting up at night and doing things he does not recall.  Pt has outpatient psychiatry through [facility], [name].     Chief Complaint:  Chief Complaint  Patient presents with   Anxiety   Visit Diagnosis: Bipolar d/o    CCA Screening, Triage and Referral (STR)  Patient Reported Information How did you hear about us? Family/Friend  What Is the Reason for Your Visit/Call Today? [facility], accompanied by his brother due to diagnosis of Bipolar I and fleeting thoughts of HI towards his mother/father with no plan or intent. Pt reports experiencing intrusive thoughts and panics that he may wake up one night and do something he regrets. Pt denies any plan for SI, no access to guns, has never attempted suicide in the past. Pt has been hospitalized for SI once a couple of years ago. Pt currently denies AVH and substance/alcohol use.  Pt has been getting up at night and wandering the houe and misplacing things and not remembering what he did or when.  Pt has had a previous psychiatric hospitalization two years ago but he cannot recall where.  Pt usually lives by himself but because of these thoughts recently he has moved in with parents temporarily.  How Long Has This Been Causing You Problems? 1 wk - 1 month  What Do You Feel Would Help You the Most Today? Treatment for Depression or  other mood problem   Have You Recently Had Any Thoughts About Hurting Yourself? Yes  Are You Planning to Commit Suicide/Harm Yourself At This time? No   Flowsheet Row [facility]  C-SSRS RISK CATEGORY No Risk High Risk No Risk       Have you Recently Had Thoughts About Hurting Someone Karolee Ohslse? Yes  Are You Planning to Harm Someone at This Time? No  Explanation: Pt has been having SI but no plan.  Pt has thoughts of harming his mother.  Pt lives with parents for right now.   Have You Used Any Alcohol or Drugs in the Past 24 Hours? No  What Did You Use and How Much? Pt denies use of substances.   Do You Currently Have a Therapist/Psychiatrist? Yes  Name of Therapist/Psychiatrist: Name of Therapist/Psychiatrist: Dr. Alanson Alyary Cottle, psychiatrist with [facility].  Has no current therapist.   Have You Been Recently Discharged From Any Office Practice or Programs? No  Explanation of Discharge From Practice/Program: No d/c from any practice.  [facility]     CCA Screening Triage Referral Assessment Type of Contact: Face-to-Face  Telemedicine Service Delivery: Telemedicine service delivery: -- (this patient seen face to face.)  Is this Initial or Reassessment? Is this Initial or  Reassessment?: Initial Assessment  Date Telepsych consult ordered in [facility]:    Time Telepsych consult ordered in [facility]:    Location of Assessment: [facility] Assessment Services  Provider Location: [facility] Assessment Services   Collateral Involvement: brother [place] Deery, [phone]   Does Patient Have a Automotive engineer Guardian? No  Legal Guardian Contact Information: Pt has no guardian.  Copy of Legal Guardianship Form: -- (Pt has no guardian.)  Legal Guardian Notified of Arrival: --  (Pt has no guardian.)  Legal Guardian Notified of Pending Discharge: -- (Pt has no guardian.)  If Minor and Not Living with Parent(s), Who has Custody? Pt is an adult.  Is CPS involved or ever been involved? Never  Is APS involved or ever been involved? Never   Patient Determined To Be At Risk for Harm To Self or Others Based on Review of Patient Reported Information or Presenting Complaint? Yes, for Harm to Others  Method: No Plan  Availability of Means: No access or NA  Intent: Vague intent or NA  Notification Required: Identifiable person is aware  Additional Information for Danger to Others Potential: -- (None)  Additional Comments for Danger to Others Potential: None  Are There Guns or Other Weapons in Your Home? No  Types of Guns/Weapons: Pt denies, access to weapons.  Are These Weapons Safely Secured?                            No  Who Could Verify You Are Able To Have These Secured: Pt denies, access to weapons.  Do You Have any Outstanding Charges, Pending Court Dates, Parole/Probation? No legal issues  Contacted To Inform of Risk of Harm To Self or Others: Other: Comment (N/A)    Does Patient Present under Involuntary Commitment? No    [place] of Residence: [place]   Patient Currently Receiving the Following Services: Medication Management   Determination of Need: Urgent (48 hours)   Options For Referral: Other: Comment; [facility]; Outpatient Therapy (Pt for continuous assessment in [facility].)     CCA Biopsychosocial Patient Reported Schizophrenia/Schizoaffective Diagnosis in Past: No   Strengths: Pt has a stong support system.   Mental Health Symptoms Depression:   Hopelessness; Difficulty Concentrating; Sleep (too much or little); Increase/decrease in appetite; Irritability   Duration of Depressive symptoms:  Duration of Depressive Symptoms: Greater than two weeks   Mania:   None   Anxiety:    Tension; Worrying (Has panic attacks  "several times a day.")   Psychosis:   None   Duration of Psychotic symptoms:    Trauma:   None   Obsessions:   N/A   Compulsions:   N/A   Inattention:   None   Hyperactivity/Impulsivity:   Feeling of restlessness   Oppositional/Defiant Behaviors:   Angry   Emotional Irregularity:   Recurrent suicidal behaviors/gestures/threats   Other Mood/Personality Symptoms:   Bi polar 1 d/o most recent eposode manic    Mental Status Exam Appearance and self-care  Stature:   Average   Weight:   Average weight   Clothing:   Casual   Grooming:   Normal   Cosmetic use:   None   Posture/gait:   Normal   Motor activity:   Not Remarkable   Sensorium  Attention:   Normal   Concentration:   Normal   Orientation:   X5   Recall/memory:   Defective in Immediate; Defective in Short-term  Affect and Mood  Affect:   Anxious   Mood:   Anxious; Depressed   Relating  Eye contact:   Normal   Facial expression:   Anxious   Attitude toward examiner:   Cooperative   Thought and Language  Speech flow:  Clear and Coherent; Normal   Thought content:   Appropriate to Mood and Circumstances   Preoccupation:   None   Hallucinations:   None   Organization:   Coherent   [employer] of Knowledge:   Fair   Intelligence:   Average   Abstraction:   Functional   Judgement:   Fair   Dance movement psychotherapist:   Adequate   Insight:   Poor   Decision Making:   Only simple   Social Functioning  Social Maturity:   Impulsive   Social Judgement:   Heedless   Stress  Stressors:   Other (Comment) (Dealing with his current mental health issues.)   Coping Ability:   Overwhelmed   Skill Deficits:   None   Supports:   Family; Friends/Service system     Religion: Religion/Spirituality Are You A Religious Person?: Yes What is Your Religious Affiliation?: Christian How Might This Affect Treatment?:  None  Leisure/Recreation: Leisure / Recreation Do You Have Hobbies?: Yes Leisure and Hobbies: Play golf.  Exercise/Diet: Exercise/Diet Do You Exercise?: No Have You Gained or Lost A Significant Amount of Weight in the Past Six Months?: No Do You Follow a Special Diet?: No Do You Have Any Trouble Sleeping?: Yes Explanation of Sleeping Difficulties: Intrusive thoughts.  Getting p at night and wandering the house.   CCA Employment/Education Employment/Work Situation: Employment / Work Situation Employment Situation: On disability Why is Patient on Disability: Bipolar 1 Disorder. How Long has Patient Been on Disability: 2013. Patient's Job has Been Impacted by Current Illness: No Has Patient ever Been in the [employer]?: No  Education: Education Is Patient Currently Attending School?: No Last Grade Completed: 12 Did You Attend College?: Yes What Type of College Degree Do you Have?: Pt reports, he went to the [employer]. Did You Have An Individualized Education Program (IIEP): No Did You Have Any Difficulty At School?: No Patient's Education Has Been Impacted by Current Illness: No   CCA Family/Childhood History Family and Relationship History: Family history Marital status: Single Does patient have children?: No  Childhood History:  Childhood History By whom was/is the patient raised?: Both parents Did patient suffer any verbal/emotional/physical/sexual abuse as a child?: No Did patient suffer from severe childhood neglect?: No Has patient ever been sexually abused/assaulted/raped as an adolescent or adult?: No Was the patient ever a victim of a crime or a disaster?: No Witnessed domestic violence?: No Has patient been affected by domestic violence as an adult?: No       CCA Substance Use Alcohol/Drug Use: Alcohol / Drug Use Pain Medications: See MAR Prescriptions: See MAR Over the Counter: See MAR History of alcohol / drug use?: No  history of alcohol / drug abuse Longest period of sobriety (when/how long): 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 Negative Consequences of Use:  (No current substance use.) Withdrawal Symptoms: None                         ASAM's:  Six Dimensions of Multidimensional Assessment  Dimension 1:  Acute Intoxication and/or Withdrawal Potential:      Dimension 2:  Biomedical Conditions  and Complications:      Dimension 3:  Emotional, Behavioral, or Cognitive Conditions and Complications:     Dimension 4:  Readiness to Change:     Dimension 5:  Relapse, Continued use, or Continued Problem Potential:     Dimension 6:  Recovery/Living Environment:     ASAM Severity Score:    ASAM Recommended Level of Treatment:     Substance use Disorder (SUD)    Recommendations for Services/Supports/Treatments:    Discharge Disposition:    DSM5 Diagnoses: Patient Active Problem List   Diagnosis Date Noted   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   S/P repair of paraesophageal hernia 10/23/2020   Secondary hyperparathyroidism of renal origin 07/03/2019   Anemia in chronic kidney disease 05/29/2019   Benign hypertensive kidney disease with chronic kidney disease 05/29/2019   Stage 3 chronic kidney disease 05/29/2019   BPH associated with nocturia 04/02/2019   Essential hypertriglyceridemia 04/02/2019   Gastroesophageal reflux disease without esophagitis 04/02/2019   Severe depressed bipolar I disorder without psychotic features 02/10/2018   Need for prophylactic chemotherapy 02/10/2018   GAD (generalized anxiety disorder) 02/10/2018     Referrals to Alternative Service(s): Referred to Alternative Service(s):   Place:   Date:   Time:    Referred to Alternative Service(s):   Place:   Date:   Time:    Referred to Alternative Service(s):   Place:   Date:   Time:    Referred to Alternative Service(s):   Place:   Date:   Time:     Wandra Mannan

## 2022-08-12 NOTE — Telephone Encounter (Signed)
Continue all meds as RX except go ahead and increase clozapine to 100 mg tablets, 1 each night as soon as he can get the 100 mg tablets from pharmacy.  RX sent.  Trying to speed up his chance of response by getting it to a hgiher dose.  Clozapine is very effective for HI/SI.  If he gets worsening HI/SI in the interim it will not be caused by meds but just the natural waxing and waining of his sx.  Once clozapine dose gets high enough it should help but avg patient needs 300 mg so we have to just be patient.

## 2022-08-12 NOTE — Telephone Encounter (Signed)
CBC results were put in this morning. Pharmacy needs a new Rx. I sent a priority message to Dr. Jennelle Human and Gloris Manchester is aware as well.

## 2022-08-12 NOTE — ED Provider Notes (Signed)
FBC/OBS ASAP Discharge Summary  Date and Time: 08/12/2022 10:55 AM  Name: Albert [name]  MRN:  [id number]   Discharge Diagnoses:  Final diagnoses:  Homicidal thoughts  Suicidal thoughts  Bipolar I disorder    Albert [name] 57 year old male who presented due to fleeting homicidal ideations towards his mother and father denied intent or plan.  Reports ongoing intrusive thoughts related to anxiety and panic attacks at night.  States " I have never tried to hurt my parents they have been supportive but what happens if I wake up in the middle of the night."  Reported previous hospitalization 2 to 3 years prior.  Currently he is denying suicidal or homicidal ideations.  Denies auditory visual hallucinations.  States his thoughts to harm his parents is mainly during panic/anxiety attacks.  He reports he is followed by Dr. Haywood Lasso for medication management.  States he is diagnosed with bipolar disorder generalized anxiety disorder and major depressive disorder.  Denied illicit drug use or substance abuse history.  He provided verbal authorization to speak to his mother [name] at [phone]  Show this provider spoke to patient's mother who denied any safety concerns with patient returning home. Stated patient dose reside alone, however has been living with them due to his worsening depression and anxiety.  Stated that family has been supportive. Stated that she will follow-up with MD Cottle for a closer follow-up appointment.   Discussed starting partial hospitalization program. Patient has been initial intake appointment on 08/17/2022.  Mother was receptive to plan.  States she and her husband have been supportive.  Reports patient resides alone.  Education provided with returning if mental health continues to decline.  Encouraged to keep all outpatient follow-up appointments.  Support, encouragement and reassurance was provided.    Audric A Royce is sitting in no acute distress. he is alert/oriented x 4;  calm/cooperative; and mood congruent with affect.he is speaking in a clear tone at moderate volume, and normal pace; with good eye contact. his thought process is coherent and relevant; There is no indication that he is currently responding to internal/external stimuli or experiencing delusional thought content; and she has denied suicidal/self-harm/homicidal ideation currently.  He is denying psychosis, and paranoia.  Patient has remained calm throughout assessment and has answered questions appropriately.      [name] is educated and verbalizes understanding of mental health resources and other crisis services in the community. He is instructed to call 911 and present to the nearest emergency room should he experience any suicidal/homicidal ideation, auditory/visual/hallucinations, or detrimental worsening of his mental health condition.He was a also advised by Clinical research associate that he could call the toll-free phone on insurance card to assist with identifying in network counselors and agencies or number on back of Medicaid card t speak with care coordinator   You are scheduled for an assessment for the PHP on 08/17/22 @ 10a. This appointment will last approximately one hour and will be virtual via Webex. PHP is virtual group therapy that runs Mon-Fri from 9am-1pm. Please download the [employer] app prior to the appointment. If you need to cancel or reschedule, please call [phone].  Total Time spent with patient: 15 minutes  Past Psychiatric History:  Past Medical History:  Family History:  Family Psychiatric History:  Social History:  Tobacco Cessation:  N/A, patient does not currently use tobacco products  Current Medications:  Current Facility-Administered Medications  Medication Dose Route Frequency Provider Last Rate Last Admin   acetaminophen (TYLENOL) tablet 650  mg  650 mg Oral Q6H PRN Sindy GuadeloupeWilliams, Roy, NP       alum & mag hydroxide-simeth (MAALOX/MYLANTA) 200-200-20 MG/5ML suspension 30 mL   30 mL Oral Q4H PRN Sindy GuadeloupeWilliams, Roy, NP       fluticasone (FLONASE) 50 MCG/ACT nasal spray 1 spray  1 spray Each Nare Daily PRN Sindy GuadeloupeWilliams, Roy, NP       levothyroxine (SYNTHROID) tablet 75 mcg  75 mcg Oral QAC breakfast Sindy GuadeloupeWilliams, Roy, NP   75 mcg at 08/12/22 0551   lisinopril (ZESTRIL) tablet 5 mg  5 mg Oral Daily Sindy GuadeloupeWilliams, Roy, NP   5 mg at 08/12/22 [id number]   lithium carbonate capsule 300 mg  300 mg Oral QHS Sindy GuadeloupeWilliams, Roy, NP   300 mg at 08/12/22 0107   OLANZapine zydis (ZYPREXA) disintegrating tablet 5 mg  5 mg Oral Q8H PRN Sindy GuadeloupeWilliams, Roy, NP       And   LORazepam (ATIVAN) tablet 1 mg  1 mg Oral PRN Sindy GuadeloupeWilliams, Roy, NP       And   ziprasidone (GEODON) injection 20 mg  20 mg Intramuscular PRN Sindy GuadeloupeWilliams, Roy, NP       magnesium hydroxide (MILK OF MAGNESIA) suspension 30 mL  30 mL Oral Daily PRN Sindy GuadeloupeWilliams, Roy, NP       meclizine (ANTIVERT) tablet 25 mg  25 mg Oral TID PRN Sindy GuadeloupeWilliams, Roy, NP       OLANZapine (ZYPREXA) tablet 10 mg  10 mg Oral QHS Sindy GuadeloupeWilliams, Roy, NP   10 mg at 08/12/22 0107   OLANZapine (ZYPREXA) tablet 20 mg  20 mg Oral QHS Sindy GuadeloupeWilliams, Roy, NP   20 mg at 08/12/22 0125   pantoprazole (PROTONIX) EC tablet 40 mg  40 mg Oral Daily Sindy GuadeloupeWilliams, Roy, NP   40 mg at 08/12/22 0921   polycarbophil (FIBERCON) tablet 1,250 mg  1,250 mg Oral Daily Sindy GuadeloupeWilliams, Roy, NP   1,250 mg at 08/12/22 [id number]   QUEtiapine (SEROQUEL) tablet 200 mg  200 mg Oral QHS Sindy GuadeloupeWilliams, Roy, NP   200 mg at 08/12/22 0108   tamsulosin (FLOMAX) capsule 0.4 mg  0.4 mg Oral Daily Sindy GuadeloupeWilliams, Roy, NP   0.4 mg at 08/12/22 [id number]   Current Outpatient Medications  Medication Sig Dispense Refill   acetaminophen (TYLENOL) 500 MG tablet Take 1,000 mg by mouth every 6 (six) hours as needed for moderate pain.     bisacodyl (DULCOLAX) 10 MG suppository Place 1 suppository (10 mg total) rectally as needed for moderate constipation. 12 suppository 0   cloZAPine (CLOZARIL) 25 MG tablet 1 tablet at night for 2 nights then 2 at night for 2 nights then 3 at night  (Patient taking differently: Take 25-50 mg by mouth at bedtime. Take one tablet at night for 2 nights, then two tablets at night for 2 nights and then three tablets at night.) 21 tablet 0   esomeprazole (NEXIUM) 40 MG capsule Take 40 mg by mouth daily.     fenofibrate (TRICOR) 145 MG tablet Take 145 mg by mouth daily.     fluticasone (FLONASE) 50 MCG/ACT nasal spray Place 1 spray into both nostrils daily as needed for allergies or rhinitis.     hydrocortisone (ANUSOL-HC) 2.5 % rectal cream Place 1 application rectally 3 (three) times daily as needed for hemorrhoids.     levothyroxine (SYNTHROID, LEVOTHROID) 75 MCG tablet Take 75 mcg by mouth daily before breakfast.     lisinopril (ZESTRIL) 5 MG tablet Take 5 mg by mouth daily.     lithium  carbonate 300 MG capsule Take 1 capsule (300 mg total) by mouth at bedtime. 30 capsule 1   LORazepam (ATIVAN) 0.5 MG tablet TAKE ONE TABLET BY MOUTH FOUR TIMES DAILY 120 tablet 1   meclizine (ANTIVERT) 25 MG tablet Take 1 tablet (25 mg total) by mouth 3 (three) times daily as needed for dizziness. 30 tablet 1   OLANZapine (ZYPREXA) 10 MG tablet TAKE 1 TABLET BY MOUTH AT BEDTIME 90 tablet 0   OLANZapine (ZYPREXA) 20 MG tablet TAKE ONE TABLET BY MOUTH AT BEDTIME 30 tablet 0   polycarbophil (FIBERCON) 625 MG tablet Take 1,250 mg by mouth daily.     QUEtiapine (SEROQUEL) 200 MG tablet TAKE 1 TABLET BY MOUTH AT BEDTIME 90 tablet 0   sildenafil (VIAGRA) 100 MG tablet Take 1 tablet (100 mg total) by mouth daily as needed for erectile dysfunction. 30 tablet 6   tamsulosin (FLOMAX) 0.4 MG CAPS capsule Take 1 capsule (0.4 mg total) by mouth daily. 90 capsule 3   WEGOVY 1 MG/0.5ML SOAJ Inject 1 mg into the skin once a week.      PTA Medications:  Facility Ordered Medications  Medication   acetaminophen (TYLENOL) tablet 650 mg   alum & mag hydroxide-simeth (MAALOX/MYLANTA) 200-200-20 MG/5ML suspension 30 mL   magnesium hydroxide (MILK OF MAGNESIA) suspension 30 mL    OLANZapine zydis (ZYPREXA) disintegrating tablet 5 mg   And   LORazepam (ATIVAN) tablet 1 mg   And   ziprasidone (GEODON) injection 20 mg   tamsulosin (FLOMAX) capsule 0.4 mg   QUEtiapine (SEROQUEL) tablet 200 mg   polycarbophil (FIBERCON) tablet 1,250 mg   meclizine (ANTIVERT) tablet 25 mg   lithium carbonate capsule 300 mg   lisinopril (ZESTRIL) tablet 5 mg   levothyroxine (SYNTHROID) tablet 75 mcg   fluticasone (FLONASE) 50 MCG/ACT nasal spray 1 spray   pantoprazole (PROTONIX) EC tablet 40 mg   OLANZapine (ZYPREXA) tablet 10 mg   OLANZapine (ZYPREXA) tablet 20 mg   PTA Medications  Medication Sig   levothyroxine (SYNTHROID, LEVOTHROID) 75 MCG tablet Take 75 mcg by mouth daily before breakfast.   fenofibrate (TRICOR) 145 MG tablet Take 145 mg by mouth daily.   lisinopril (ZESTRIL) 5 MG tablet Take 5 mg by mouth daily.   acetaminophen (TYLENOL) 500 MG tablet Take 1,000 mg by mouth every 6 (six) hours as needed for moderate pain.   hydrocortisone (ANUSOL-HC) 2.5 % rectal cream Place 1 application rectally 3 (three) times daily as needed for hemorrhoids.   fluticasone (FLONASE) 50 MCG/ACT nasal spray Place 1 spray into both nostrils daily as needed for allergies or rhinitis.   polycarbophil (FIBERCON) 625 MG tablet Take 1,250 mg by mouth daily.   meclizine (ANTIVERT) 25 MG tablet Take 1 tablet (25 mg total) by mouth 3 (three) times daily as needed for dizziness.   sildenafil (VIAGRA) 100 MG tablet Take 1 tablet (100 mg total) by mouth daily as needed for erectile dysfunction.   tamsulosin (FLOMAX) 0.4 MG CAPS capsule Take 1 capsule (0.4 mg total) by mouth daily.   LORazepam (ATIVAN) 0.5 MG tablet TAKE ONE TABLET BY MOUTH FOUR TIMES DAILY   bisacodyl (DULCOLAX) 10 MG suppository Place 1 suppository (10 mg total) rectally as needed for moderate constipation.   OLANZapine (ZYPREXA) 10 MG tablet TAKE 1 TABLET BY MOUTH AT BEDTIME   QUEtiapine (SEROQUEL) 200 MG tablet TAKE 1 TABLET BY MOUTH  AT BEDTIME   OLANZapine (ZYPREXA) 20 MG tablet TAKE ONE TABLET BY MOUTH AT  BEDTIME   esomeprazole (NEXIUM) 40 MG capsule Take 40 mg by mouth daily.   WEGOVY 1 MG/0.5ML SOAJ Inject 1 mg into the skin once a week.   lithium carbonate 300 MG capsule Take 1 capsule (300 mg total) by mouth at bedtime.   cloZAPine (CLOZARIL) 25 MG tablet 1 tablet at night for 2 nights then 2 at night for 2 nights then 3 at night (Patient taking differently: Take 25-50 mg by mouth at bedtime. Take one tablet at night for 2 nights, then two tablets at night for 2 nights and then three tablets at night.)        No data to display          Flowsheet Row ED from 08/11/2022 in [facility] ED from 08/01/2022 in [facility] Emergency Department at [facility] ED from 04/02/2022 in [facility] Department at [facility]  C-SSRS RISK CATEGORY No Risk High Risk No Risk       Musculoskeletal  Strength & Muscle Tone: within normal limits Gait & Station: normal Patient leans: N/A  Psychiatric Specialty Exam  Presentation  General Appearance:  Casual  Eye Contact: Good  Speech: Clear and Coherent  Speech Volume: Normal  Handedness: Right   Mood and Affect  Mood: Anxious  Affect: Congruent   Thought Process  Thought Processes: Coherent  Descriptions of Associations:Circumstantial  Orientation:Full (Time, Place and Person)  Thought Content:WDL  Diagnosis of Schizophrenia or Schizoaffective disorder in past: No    Hallucinations:Hallucinations: None  Ideas of Reference:None  Suicidal Thoughts:Suicidal Thoughts: Yes, Passive SI Active Intent and/or Plan: Without Plan  Homicidal Thoughts:Homicidal Thoughts: Yes, Passive HI Passive Intent and/or Plan: Without Intent; Without Plan   Sensorium  Memory: Immediate Fair  Judgment: Fair  Insight: Fair   Chartered certified accountant: Good  Attention  Span: Good  Recall: Good  Fund of Knowledge: Good  Language: Good   Psychomotor Activity  Psychomotor Activity: Psychomotor Activity: Normal   Assets  Assets: Desire for Improvement   Sleep  Sleep: Sleep: Fair   Nutritional Assessment (For OBS and FBC admissions only) Has the patient had a weight loss or gain of 10 pounds or more in the last 3 months?: No Has the patient had a decrease in food intake/or appetite?: No Does the patient have dental problems?: No Does the patient have eating habits or behaviors that may be indicators of an eating disorder including binging or inducing vomiting?: No Has the patient recently lost weight without trying?: 0 Has the patient been eating poorly because of a decreased appetite?: 0 Malnutrition Screening Tool Score: 0    Physical Exam  Physical Exam Vitals and nursing note reviewed.  Constitutional:      Appearance: Normal appearance.  Cardiovascular:     Rate and Rhythm: Normal rate and regular rhythm.  Pulmonary:     Effort: Pulmonary effort is normal.     Breath sounds: Normal breath sounds.  Neurological:     Mental Status: He is alert and oriented to person, place, and time.  Psychiatric:        Mood and Affect: Mood normal.        Behavior: Behavior normal.        Thought Content: Thought content normal.    Review of Systems  Constitutional: Negative.   Psychiatric/Behavioral:  Positive for depression. Negative for suicidal ideas. The patient is nervous/anxious.   All other systems reviewed and are negative.  Blood pressure 114/77,  pulse 78, temperature 98.3 F (36.8 C), resp. rate 20, SpO2 100 %. There is no height or weight on file to calculate BMI.  Demographic Factors:  Male and Caucasian  Loss Factors: NA  Historical Factors: Impulsivity  Risk Reduction Factors:   Sense of responsibility to family, Positive social support, and Positive therapeutic relationship  Continued Clinical Symptoms:   Severe Anxiety and/or Agitation  Cognitive Features That Contribute To Risk:  Closed-mindedness    Suicide Risk:  Minimal: No identifiable suicidal ideation.  Patients presenting with no risk factors but with morbid ruminations; may be classified as minimal risk based on the severity of the depressive symptoms  Plan Of Care/Follow-up recommendations:  Activity:  as tolerated Diet:  heart healthy    You are scheduled for an assessment for the PHP on 08/17/22 @ 10a. This appointment will last approximately one hour and will be virtual via Webex. PHP is virtual group therapy that runs Mon-Fri from 9am-1pm. Please download the [employer] app prior to the appointment. If you need to cancel or reschedule, please call [phone].  Disposition: Take all of you medications as prescribed by your mental healthcare provider.  Report any adverse effects and reactions from your medications to your outpatient provider promptly.  Do not engage in alcohol and or illegal drug use while on prescription medicines. Keep all scheduled appointments. This is to ensure that you are getting refills on time and to avoid any interruption in your medication.  If you are unable to keep an appointment call to reschedule.  Be sure to follow up with resources and follow ups given. In the event of worsening symptoms call the crisis hotline, 911, and or go to the nearest emergency department for appropriate evaluation and treatment of symptoms. Follow-up with your primary care provider for your medical issues, concerns and or health care needs.    Oneta Rack, NP 08/12/2022, 10:55 AM

## 2022-08-12 NOTE — ED Notes (Addendum)
Pt cooperative this morning, ate breakfast, took his medications, denies SI/HI/AVH. Sleeping at this time.

## 2022-08-12 NOTE — Telephone Encounter (Signed)
I am forwarding you Albert [name]'s note.  In addition, mom has called [place] and called the pharmacy multiple times. We did receive results this morning and Traci has put them in. Pharmacy has seen that the labs are in [facility], but said they need a new Rx.  Per your note patient is supposed to start on 100 mg on 4/15.  Please advise if I need to do anything.

## 2022-08-12 NOTE — Discharge Instructions (Addendum)
You are scheduled for an assessment for the PHP on 08/17/22 @ 10a. This appointment will last approximately one hour and will be virtual via Webex. PHP is virtual group therapy that runs Mon-Fri from 9am-1pm. Please download the [employer] app prior to the appointment. If you need to cancel or reschedule, please call [phone].     Take all medications as prescribed. Keep all follow-up appointments as scheduled.  Do not consume alcohol or use illegal drugs while on prescription medications. Report any adverse effects from your medications to your primary care provider promptly.  In the event of recurrent symptoms or worsening symptoms, call 911, a crisis hotline, or go to the nearest emergency department for evaluation.

## 2022-08-12 NOTE — Telephone Encounter (Signed)
Patsy would like a call to let her know she is going in right direction  with his care and that the bloodwork gets put in so that he can get his med. ( I'll be glad to give her a call back if you let me know)

## 2022-08-12 NOTE — ED Notes (Signed)
Patient has been brought on unit and familiarized with unit, medication has been given, snacks have been given. Patient is currently calm and in no distress, will continue to monitor patient for safety.

## 2022-08-12 NOTE — ED Notes (Signed)
Pt sleeping@this time. Breathing even and unlabored. Will continue to monitor for safety 

## 2022-08-12 NOTE — Telephone Encounter (Signed)
Mother, Patsy called. Per note made by Larinda Buttery. He was referred to [facility].Reporting that he did go up there last night. This morning they called her and told her to come pick him up. They told her he wasn't having homicidal or suicidal thoughts and it would be four to five days of him waiting to go to a facility in [place]. They recommended him for PHP to start 4/16. She picked him up today. He is worried about these thoughts coming back up. Also, [facility] came out yesterday to assess him and offered counseling from [facility]. He wants to do counseling with them.  Any input on care he can receive?  He is so discouraged and wants to be safe.  (She notes that the [facility] gave him- Zyprexa 10mg  at 1:07am and again at 1:25am per the med list- based on his previous med list) He was supposed to be off of Zyprexa10mg . Has been on  Clozapine since Monday. He did go ahead and take it before he left ( 2pills) so he hasn't missed a dose and did go get labwork done. Is wanting to continue the Clozapine, please check the labwork to be able to fill the 100mg  dose.

## 2022-08-13 NOTE — Telephone Encounter (Signed)
Mom called and notified of recommendations.

## 2022-08-17 ENCOUNTER — Other Ambulatory Visit (HOSPITAL_COMMUNITY): Payer: Medicare PPO | Attending: Psychiatry | Admitting: Professional

## 2022-08-17 DIAGNOSIS — F411 Generalized anxiety disorder: Secondary | ICD-10-CM

## 2022-08-17 NOTE — Psych (Signed)
Virtual Visit via Video Note  I connected with Albert [name] on 08/17/22 at 10:00 AM EDT by a video enabled telemedicine application and verified that I am speaking with the correct person using two identifiers.  Location: Patient: Home Provider: Clinical Home Office   I discussed the limitations of evaluation and management by telemedicine and the availability of in person appointments. The patient expressed understanding and agreed to proceed.  Follow Up Instructions:    I discussed the assessment and treatment plan with the patient. The patient was provided an opportunity to ask questions and all were answered. The patient agreed with the plan and demonstrated an understanding of the instructions.   The patient was advised to call back or seek an in-person evaluation if the symptoms worsen or if the condition fails to improve as anticipated.  I provided 5 minutes of non-face-to-face time during this encounter.   Albert [name], [facility]  Albert [name] reported for PHP [facility]. Cln oriented Albert [name] to [facility]. Albert [name] reports he cannot commit to doing group from 9-1, M-F. Albert [name] reports he wants individual counseling. This cln will send referrals via email. This cln also encouraged Albert [name] to call his insurance company for referrals, if needed. Albert [name] denies SI/HI/AVH.

## 2022-08-18 ENCOUNTER — Other Ambulatory Visit: Payer: Self-pay | Admitting: Psychiatry

## 2022-08-18 DIAGNOSIS — F411 Generalized anxiety disorder: Secondary | ICD-10-CM

## 2022-08-18 DIAGNOSIS — F4001 Agoraphobia with panic disorder: Secondary | ICD-10-CM

## 2022-08-18 DIAGNOSIS — F314 Bipolar disorder, current episode depressed, severe, without psychotic features: Secondary | ICD-10-CM

## 2022-08-19 ENCOUNTER — Telehealth: Payer: Self-pay | Admitting: Psychiatry

## 2022-08-19 ENCOUNTER — Other Ambulatory Visit: Payer: Self-pay | Admitting: Psychiatry

## 2022-08-19 DIAGNOSIS — F314 Bipolar disorder, current episode depressed, severe, without psychotic features: Secondary | ICD-10-CM

## 2022-08-19 LAB — CBC WITH DIFFERENTIAL/PLATELET
Basophils Absolute: 0.1 x10E3/uL (ref 0.0–0.2)
Basos: 2 %
EOS (ABSOLUTE): 0.4 x10E3/uL (ref 0.0–0.4)
Eos: 8 %
Hematocrit: 37.6 % (ref 37.5–51.0)
Hemoglobin: 12.8 g/dL — ABNORMAL LOW (ref 13.0–17.7)
Immature Grans (Abs): 0 x10E3/uL (ref 0.0–0.1)
Immature Granulocytes: 0 %
Lymphocytes Absolute: 1.5 x10E3/uL (ref 0.7–3.1)
Lymphs: 29 %
MCH: 31.7 pg (ref 26.6–33.0)
MCHC: 34 g/dL (ref 31.5–35.7)
MCV: 93 fL (ref 79–97)
Monocytes Absolute: 0.3 x10E3/uL (ref 0.1–0.9)
Monocytes: 6 %
Neutrophils Absolute: 2.8 x10E3/uL (ref 1.4–7.0)
Neutrophils: 55 %
Platelets: 484 x10E3/uL — ABNORMAL HIGH (ref 150–450)
RBC: 4.04 x10E6/uL — ABNORMAL LOW (ref 4.14–5.80)
RDW: 11.8 % (ref 11.6–15.4)
WBC: 5.2 x10E3/uL (ref 3.4–10.8)

## 2022-08-19 MED ORDER — CLOZAPINE 100 MG PO TABS
ORAL_TABLET | ORAL | 0 refills | Status: DC
Start: 2022-08-19 — End: 2022-08-27

## 2022-08-19 NOTE — Telephone Encounter (Signed)
Labs are in [facility] and Dr. Jennelle Human to submit a refill for pt to [facility], which is [facility] certified.

## 2022-08-19 NOTE — Telephone Encounter (Signed)
Mom lvm stating that Albert [name] had his blood work done yestrerday for the clozapine. He only has one pill left. She said the pharmacy [facility] [place].

## 2022-08-20 ENCOUNTER — Ambulatory Visit (INDEPENDENT_AMBULATORY_CARE_PROVIDER_SITE_OTHER): Payer: Medicare PPO | Admitting: Psychiatry

## 2022-08-20 ENCOUNTER — Encounter: Payer: Self-pay | Admitting: Psychiatry

## 2022-08-20 DIAGNOSIS — F5105 Insomnia due to other mental disorder: Secondary | ICD-10-CM

## 2022-08-20 DIAGNOSIS — K5903 Drug induced constipation: Secondary | ICD-10-CM

## 2022-08-20 DIAGNOSIS — F4001 Agoraphobia with panic disorder: Secondary | ICD-10-CM | POA: Diagnosis not present

## 2022-08-20 DIAGNOSIS — F314 Bipolar disorder, current episode depressed, severe, without psychotic features: Secondary | ICD-10-CM | POA: Diagnosis not present

## 2022-08-20 DIAGNOSIS — F411 Generalized anxiety disorder: Secondary | ICD-10-CM

## 2022-08-20 DIAGNOSIS — Z79899 Other long term (current) drug therapy: Secondary | ICD-10-CM

## 2022-08-20 DIAGNOSIS — G3184 Mild cognitive impairment, so stated: Secondary | ICD-10-CM

## 2022-08-20 MED ORDER — LINACLOTIDE 145 MCG PO CAPS
145.0000 ug | ORAL_CAPSULE | Freq: Every day | ORAL | 1 refills | Status: DC
Start: 2022-08-20 — End: 2022-09-01

## 2022-08-20 MED ORDER — LORAZEPAM 0.5 MG PO TABS: 0.5000 mg | ORAL_TABLET | Freq: Four times a day (QID) | ORAL | 1 refills | Status: DC

## 2022-08-20 NOTE — Patient Instructions (Addendum)
Stop Seroquel Increasing clozapine to 150 mg until Monday to 200 mg nightly Start Linzess 1 in the AM

## 2022-08-20 NOTE — Progress Notes (Signed)
Albert [name] [id number] 01-15-1966 57 y.o.    Subjective:   Patient ID:  Albert [name] is a 57 y.o. (DOB 02/05/1966) male.  Chief Complaint:  Chief Complaint  Patient presents with  . Follow-up  . Depression  . Anxiety  . Manic Behavior    Depression        Associated symptoms include decreased concentration and suicidal ideas.  Past medical history includes anxiety.   Anxiety Symptoms include confusion, decreased concentration, dizziness, nervous/anxious behavior and suicidal ideas. Patient reports no chest pain, palpitations or shortness of breath.     Albert [name] presents to the office today for follow-up of mixed bipolar, anxiety and still grieving stress of breakup.  He requires frequent follow-up because of long-term symptoms.  At visit October 25, 2018.  We made several medicine changes because of his concerns about sleep and other issues.  We increased olanzapine back to 7.5 mg bc not sleeping as well at 5 mg.  He has to have the prescription for quetiapine written for up to 3 tablets at night in case insomnia is worse because insomnia makes his mood disorder so much worse.  We discussed that was above the usual max but the request was granted given his treatment resistant status. We also reduce lithium from 900 mg daily to 750 mg daily to try to reduce tremor and muscle twitches.  At  visit March 28, 2019.  Fluoxetine was increased to 40 mg daily.   Early January 2021 increased to 60 mg daily.  No SE.  seen June 28, 2019.  No further meds were changed except olanzapine was increased back to 10 mg daily to see if if he could get additional benefit per his request.  Covid vaccinated.  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  Then had cholecystectomy.  April 2021 appt with the following noted: 2 episodes night sweats lately.  Memory has been very bad lately.  Repeatedly asks mother questions. Still  tend to stay in his room and his bed.  Still has rapid cycling mood swings.  Maybe  some better with increase in olanzapine to 10 and tolerating itl.  Would like to get out more but can't DT Covid.  Can perform necessary chores.  Will get out of the house when he can.  No change in death thoughts and anxiety in intensity but is better with frequency.  Usually comes and goes in waves but more persistent.  Consistent with meds.  Ativan not helping anxiety very dramatically but he's not sure.  Fidgety.  No trigger other than still grieving relationship and can't get it out of his head.  Poor energy, concentration and more forgetful. In bed more and less active.  Sleep ok lately which is unusual.  Can concentrate on financial matters and stock market at times.  Tolerating meds.  Still some intrusive SI without reason. Less frequent obsessive thoughts about broken relationship and has been doing this for months.  Can't let it go.  Loop.   No unusual stress even with the family who is supportive. Likes the benefit that Zyprexa gives. No sleepwalking nor falling nor odd behavior.  No history of sleep walking.  He understands this may recur with an increase.  Also wants option to rarely take extra quetiapine 300 for sleep prn. Plan:  Try to reduce lorazepam if possible.  10/22/2019 appointment with the following noted: Continues fluoxetine 60, lithium 600 mg, lorazepam 2 mg AM and HS and 1 mg midday,  olanzapine 10, quetiapine 600 mg HS. Still anxious chronically and including driving in crowded spaces.  Doesn't thing Ativan helps as well as Xanax but less cognitive problems. Sleep is not as good.  Occ EFA.  More EMA and wanting to do things in the middle of the night but this is not typical.  Wonders about why that happens.  Some napping.   Tolerating meds.  Asks about weight loss meds.  Disc this in detail.   Plan no changes except OK meclizine prn vertigo.  02/11/20 appt with the following noted: Able to gradually reduce lorazepam to 1 mg AM and HS. More depressed over time and less  interested in things and less interest in going out but does with his parents. Has reduced from 800 to 600 mg HS with some awakening but usually able to go back to sleep.[name]  Spending a good amount of time in bed bc watches TV in bedroom.  Parents watch TV in different part of the house.  No mood swings he notices.   Concerns about weight gain about 200#. Likes olanzapine's benefit for sleep. Plan: Trial for TRD to  Increase fluoxetine to 80 mg daily  to use the combo with olanzapine for TR bipolar depression.   04/21/20 appt with following noted: I thought in beginning some benefit with fluoxetine.  Lifelong negative thinking continues.   Not much bipolar.  Reads a lot on bipolar.   Still tired and anxious a little more may be seasonal.  No familial stressors.  Tends to lay down in afternoon.  Anxiety not over anything in particular.   No SE with fluoxetine. Took meclizine prn.  Easily motion sick.    Asks questions about newer drugs for bipolar like Caplyta. Plan:  No med changes  06/30/20 appt noted: Tired of wearing masks.  Vaccinated.  Asked questions about when this will end with mask mandate.  Mood up and down some since here and getting up 2-3 times.  Disc awakening problems.  Trying to do better bc night eating some.   Mood is about average today so far.  Albert [name] out with friends for breakfast.  Recognizes activity helps mood.   3 days in a row napped a lot in the afternoon.   Bed is a comfort place.  Gets bored and hard to motivate.  Tries to stay away from night eating and spending.   More depressed when alone and wonders about med changes. Plan: Failed response to olanzapine + fluoxetine for TR bipolar depression.   Reduce fluoxetine to 1 daily and reduce olanzapine to one half nightly for 10 days and then stop it. Then start Caplyta 1 daily  08/27/2020 appointment with the following noted: Patient decompensated with the above switch to Caplyta with intrusive suicidal thoughts and had  to be hospitalized for psychiatric reasons.  Multiple phone calls with family members since that time.  Spoke with the psychiatric nurse practitioner with the decision to restart olanzapine in place of the Caplyta given the patient was more stable while on the combination of Seroquel and olanzapine than with the Caplyta. Caplyta triggered HI/SI and depressed and confused on it, and agitated and still has some of it now. No akathisia. Frustrated with lack of therapy at the hospital.   Hydroxyzine didn't help jitteriness.  Feels some better.  Fleeting SI & HI but not obsessive like it was prior to hospitalization.  Feels a little amped up and nervous.  Scared of what could happen.  Apprehensive being  here talking about things. 5-6 hours sleep last night and would like to have more. At mom and dad's house right now and up more in the day. Inadvertently stopped lorazepam abruptly by mistake likely contributing to shakiness. Still some memory issues.  Trying to stay out of bed when watching TV.  Some awakening but managing. Occ fleeting SI and distracts himself.   Always spends a lot of time just laying around.   Can have anxiety for no reason like coming here, and varies in intensity without pattern.      Less panic than in the past.  Some chronic depression and hyperactivity and loudness and hyperverbal.  Usually back to sleep.  Total 6 hours but some napping, awakens 2-4 times nightly but back to sleep..  Taking quetiapine 600 mg HS.  still lacks interest and motivation.  Can follow a TV show if interested. Plan: Restart lorazepam 1 mg in the morning, 1 mg in the afternoon and 1 mg at night for anxiety Stop hydroxyzine  Increase olanzapine to 1 and 1/2 of 10 mg tablets in evening  09/05/2020 appt noted: seen with parents today at his request Made med changes noted and tolerated the changes Better than I did last week. Now only fleeting HI/SI and "no where near what it was".  Sleeping better.  Still not a  lot of energy.  Anhedonia.  Less agitation.  A lot of anxiety all the time but it's helping.  Would like more energy and motivation.   Doesn't handle stress well. Parents note he's less shakey.  He's still staying with his parents.  Only driven once since this happened and F wants him to be able to drive and function more independently.  M agrees he's better.  Memory is better but not normal.  Primarily STM problems No akathisia with olanzapine this time.    Administering his own meds but mo watched. Slept 8 hours last night. Plan consider further reductions in quetiapine  10/17/20 appt noted: Stayed on Seroquel 600 HS and olanzapine 15. Olanzapine seemed to do most for the sleep.  Also helping eliminated HI/SI for the most part.  Wants to try to increase it.  Depression is much better with less rumination also.  Fears akathisia at higher dose as in the past. Also on fluoxetine 40.   Plan: Reduce Seroquel to 1 and 1/2 tablets at night Increase olanzapine to 1 of the 15 mg tablet and 1/2 of the 5 mg tablet for 1 week,  Then, if tolerated increase the olanzapine to 20 mg or 1 of the 15 mg tablets and 1 of the 5 mg tablets. If possible then also reduce quetiapine to 1 tablet at night.  11/05/2020 appointment with the following noted: Up to olanzapine 20 mg hs for 3 days.  Reduced Serooquel to 450 mg HS.  Didn't try to go lower. Had hiatal hernia and umbilical hernia surgery recently with no problem.   No akathisia so far. Sleep ok with meds so far changes.  No mood changes yet but overall likes the smoothness of olanzapine and helping sleep without knociing him out. Still some occ HI/SI, he thinks bc of anxiety generally.  Easily anxious. Plan: Continue olanzapine 20 mg nightly longer to give it time to help mood sx reduce quetiapine to 1 tablet at night to minimize polypharmacy and reduce risk of akathisia.  01/07/21 appt noted: Able to reduce quetiapine 300 mg HS ok with decent sleep but still  wakes a lot.  No AM hangover.  Don't have a lot to do.  Can enjoy going out and doing things.  But doesn't do it longterm.  Family is doing good.  Father started year 26 at his job.   Tolerating the meds well.  May wake more with quetiapine reduction. Lost 10#. Wonders if could try other meds he didn't tolerate before bc now can tolerate olanzapine and couldn't tolerate it in the past DT akathisia. Mood never great but can swing into depresssion without reason.   Plan: Continue olanzapine 20 mg nightly longer to give it time to help mood sx reduce quetiapine to 1/2 of the 300 mg  tablet at night to minimize polypharmacy and reduce risk of akathisia.  02/02/2021 appointment with the following noted: Tried reduced quetiapine to 150 mg and after a week had withdrawal sx including jittery and SI and he increased to 250 mg daily.  Felt better after increasing to 300 mg daily and last week reduced to 250 mg daily.  After increase quetiapine felt better in a few days.  Seems more alert with less quetiapine but can't tolerate a quick withdrawal.   Some awakening.  Plan: Reduce quetiapine by 50 mg every 2-4 weeks.  03/09/2021 phone call: Pt called and said that he is weaning off the seroquel. He was down to 200 mg of the seoquel and he started having withdrawal symptoms and sucidal thoughts. He went back up to 250 mg because he knew he was good on that.    Pt stated he starting back taking 250 mg on Saturday due to the symptoms he was having.Now he is no longer having suicidal thoughts but still very jittery. MD: He may just need to taper slower than most people.  If he can tolerate the 250 mg Seroquel with the jitteriness it should get better over the next 1 to 2 weeks.  Then he can try going down again by 50 mg at a time.  He probably needs to go down by just 50 mg every 2 to 4 weeks and wait till he fully adjusts to each reduction before he reduces again.  03/17/21 appt noted: Hernia surgery 2 weeks  ago. Seroquel 250 mg for a month then 200 mg daily and had SI and increased again to 300 mg daily.  Would love to get off it.  Withdrawal triggers intrusive HI/SI but otherwise doesn't have them No SE. Sleep is OK but not enough REM sleep.  Satisfied with other meds.   No current HI/SI.  No paranoia.  Still has anxiety and taking Ativan every 6 hours.  It works but doesn't cure it. Chronic anxiety and ask about ketamine for chronic depression. Plan: Because he had WD reducing from 250 to 200 mg daily will have to go slowly. Reduce quetiapine by 25 mg every 2-4 weeks. Reduce Seroquel to 250 mg for 2 weeks, Then reduce to 225 mg for 2 weeks, Then reduce to 200 mg for 2 weeks, Then reduce to 175 mg for 2 weeks, Then reduce to 150 mg Also: Buspirone 30 mg tablets for anxiety Start 1/3 tablet twice daily for 1 week Then increase to two thirds twice daily for 1 week Then increase to 1 tablet twice daily  04/15/2021 appointment with the following noted: Reduced Seroquel to 200 mg HS and increased buspirone to 30 mg BID STM px and wants to try to reduce Ativan to see if it is better.  But fears with drawal sx. Parents complain about his memory.  Less HI and SI since here. Less depressed. Plan: Buspirone 30 mg tablets for anxiety Start 1/3 tablet twice daily for 1 week Then increase to two thirds twice daily for 1 week Then increase to 1 tablet twice daily Continue olanzapine 20 mg nightly longer to give it time to help mood sx Because he had WD reducing from 250 to 200 mg daily will have to go slowly. Reduce quetiapine by 25 mg every 2-4 weeks. Reduce Seroquel to 250 mg for 2 weeks, Then reduce to 225 mg for 2 weeks, Then reduce to 200 mg for 2 weeks, Then reduce to 175 mg for 2 weeks, Then reduce to 150 mg Trial taper lorazepam: In hopes of improving short-term memory  04/30/2022 phone call: Complaining of withdrawal symptoms reducing lorazepam from 0.5 mg 5 times a day to 0.5 mg 4  times daily.  05/21/2021 appointment with the following noted: Gotten onto buspar 30 BID and down on lorazepam to 0.5 mg QID. Gotten down to Seroquel 200 mg HS A little restless every now and then but able to reduce lorazepam for the last weeks. Is too inactive and lays in bed too much watching TV.   Can' t tell effect of buspirone bc of other changes.  Sleep with awakening with 6-7 hours. Fleeting SI without trigger or plan or intent. Depressed more than anxious. Enjoys watching stocks and investing. Plan: Trial taper lorazepam gradually over time In hopes of improving short-term memory But for now continue lorazepam 0.5 mg QID Start clonidine 0.1 mg tablets one half at night for 3 nights, then 1/2 tablet twice daily for 3 nights, then one half in the morning and 1 tablet at night If clonidine does not noticeably help anxiety call the office  05/27/2021 phone call initially complaining of homicidal and suicidal thoughts after taking 1 mg of clonidine.  He was told to stop the medication.  But then called back stating he was having homicidal and suicidal thoughts from withdrawal from the medication which was not logical.  He agreed it was not logical and stated on 05/29/2021 that he felt better  06/23/2021 appointment the following noted: No clonidine. Sunday panic attack eating out and then having SI.  Stayed with father to feel safer. Intrusive thoughts of hurting parents or others.  HI only when has SI.  Usually panic without SI.  Then had intrusive thoughts about hurting others.   Usually intrusive thoughts are brief and fleeting.    07/20/21  appt noted; More anxious with less lithium.  More anxious in his body.  Not more depressed or irritable.  Sleep is the same.  More racing thoughts.  Chronic SI probably a litte worse but not unmanageable. Rduced lithium last week to 300 mg daily. No recent change in lisinopril. Off the clonidine for a week or so. Doesn't drink much water.  Drinks a  lot of diet coke. No marked akathisia or RLS but does shake his leg DT anxiety. Worries about needing mor emedicine. Plan: Increase olanzapine to 25 mg nightly bc more racing thoughts and anxiety with less lithium DT severity of sx and difficulty getting off Seroquel conside rincrease.  07/30/21 TC : CO racing SI today and wonders about whether increased sx racing thoughts, hyperactivity, SI related to Reduced lithium from 600 mg daily to 300 mg daily recently DT lithium level 1.8.  could be related.    Plan: increase olanzapine 30 mg pm Today take lithium 600 mg now.  Tomorrow start lithium CR  450 mg daily Continue olanzapine 30 mg PM until SI resolves unless akathisia returns.  Albert Staggers, MD, DFAPA  08/17/21 appt noted:  No akathisia with olanzapine 30 mg pm (about 3 weeks) and is sleeping better. Increased lithium to 450 mg daily. Tolerating meds. Still racing negative thoughts maybe some better.  Asked about it. SI come and go.   Anxiety about the same as depression.  No motivation unless has to do something. Plan: Reduce  fluoxetine 20 mg 1 daily in hopes racing thoughts and anxiety are better.  09/14/2021 appointment with the following noted: No difference noted with reduction fluoxetine 20 mg daily. Most of the time negative thoughts, ex "what if I get in a wreck?"  Frequent negative thoughts.   Still on lorazepam 0.5 mg TID.   Wonders about trying prior meds that caused akathisia bc no longer gets it with olanzapine 30 mg daily. Sleep is usually ok.  Taking Seroquel 200 mg HS.  Thinks the olanzapine helps his sleep and doesn't want to stop it. Panic 1-2 times per month.  Then takes extra lorazepam. Plan: DC fluoxetine 20 mg 1 daily  Trial Auvelity off label for depression 1 in the AM for 1 week then 1 in AM and 1 with evening meal  10/15/2021 appointment with the following noted: Occ of getting confused as to day and went to church on a Thursday my mistake.   Had it happen a  few months ago too.  Memory is terrible. Thinks he couldn't tolerate more Auvelity. Not a lot of benefit from B[place] 1 daily. Still quite a bit of anxiety.   A little drowsy  and sleeps 7-8 hours at night. Word finding problems.  Lost wallet. Plan: DC  Auvelity  For [facility]: memantine 1/2 tablet in the AM for 1 week, then 1/2 tablet twice daily, then 1/2 tablet in the AM and 1 tablet at night for 1 week then 1 tablet twice daily  10/22/2021 phone call complaining of additional stress asking to start new medication because of anxiety and more suicidal thoughts without intent or plan.  Reports taking more than 4 prescribed Ativan a day recently.  Asking to increase olanzapine which is already at 30 mg daily. MD response:We cannot go any higher in the dosage of olanzapine that he is currently taking.  A lot of these suicidal thoughts are apparently anxiety driven.  Let us have him resume taking paroxetine which is one of the more effective medicines for anxiety.  I will send it in and the instructions will be paroxetine 20 mg tablets 1/2 tablet daily for 1 week then 1 tablet daily  11/16/21 appt noted: Multiple phone calls since she was here.  Complaining of suicidal thoughts from memantine which was stopped. Started paroxetine and up to 20 mg daily.  Continues olanzapine 30 mg, lithium 450 mg nightly, lorazepam 0.5 mg 4 times daily , And quetiapine 200 mg nightly. Questions about whether memantine caused the neg SI really bc has them at times. When has SI often anxiety driven.  Will tend to ruminate on past relationship ended.   Edgy and irritable all the time but memantine seemed to make it worse. Mind is getting better with less SI lately.   No SE with paroxetine.   Aggrivated by forgetfulness.  eXample, talked with someone about going to breakfast last night and then forgot to go this mornin.g. Plan prescribed donepezil 5 mg daily for cognitive complaints specifically forgetfulness  12/31/2021  appointment with the  following noted: He had been prescribed donepezil 5 mg for cognitive complaints.  He called back reporting he felt it was causing homicidal thoughts and was instructed to stop it.  He has had these types of thoughts in the past.  He had no desire to act on them. Discouraged. Ongoing depression and reduced enjoyment and negative thoughts.  Can enjoy some things. A lot of anxiety chronically.  Trying to limit Ativan to 5 of 0.5 mg daily.  Sometimes needs 2. Constipation for years.  02/03/22 appt noted: Depression makes LBP worse. Ongoing chronic depression. Tolerated rivastigmine without triggering SI Chronic anxiety and Ativan helps some. Occ panic but not usual.  Asks if any other meds coud be used. Plan: But for now continue lorazepam 0.5 mg QID, but use LED for cog reasons Increase rivastigmine to 3 mg capsule 1 twice daily or 2 of the 1.5 mg capsules twice daily.  Call if you have problems with nausea Start dextromethorphan 1 capsule twice daily . Thi sis off label for depression based on MOA of Auvelity and use of paroxetine to prolong half life of DM. Presence of paroxetine may have been reason he didn't tolerate Auvelity brief trial before.  Will proceed slowly.  03/17/2022 appointment noted: Current psych meds: Rivastigmine 3 mg twice daily, quetiapine 200 mg nightly, paroxetine 40 mg daily, olanzapine 30 mg nightly, Namenda 10 mg daily, lorazepam 0.5 mg 4 times daily, lithium CR 450 mg nightly, dextromethorphan 15 mg twice daily A lot of anxiety since here.  Some days taken more lorazepam to 6-7 tablets daily.  No major triggers but anxious even about coming here and other normal things.  Cancelled trip to collect rocks with friend and cancelled the trip bc anxious.  Usually if can go he is ok.  Lots of SI ongoing Memory might be a little better. Asks how he responded to [employer].  Will check paper chart.  03/24/22 TC:  CO SI worse and restilless with Rivastigmine 9  mg . MD response:  Stop the rivastigmine since the [place] appears to be having side effects from it.  He was having anxiety at the last appointment 2 and then we increased the dose.  This might be causing the anxiety to be worse. He has chronic suicidal thoughts without intent or plan.  However it appears the thoughts are more intense with the rivastigmine     05/06/21 appt noted: Stopped rivastigmine DT anxiety and worsening SI.  Was better the next day. This week had some SI.  Thinking about the prior relationship tends to lead to SI. Will take Ativan when gets SI and it helps a little at 0.5 mg at a time. Plan: Consider fluvoxamine.  Yes this may help with obsessions on old GF and he failed other optioons. Start fluvoxamine 1/2 of 50 mg tablet and reduce paroxetine to 1 and 1/2 of 20 mg tablets for 5 days, Then increase fluvoxamine to 1 tablet daily and reduce paroxetine to 1 tablet daily for 5 days, Then increase fluvoxamine to 1-1/2 tablets daily and reduce paroxetine to 1/2 tablet daily for 5 days, Then increase fluvoxamine to 2 tablets daily and stop paroxetine   06/07/22 appt noted: Feels like the fluvoxamine 50 BID is too much.  On this a couple of weeks. Worries he might be worse with this with regard to SI.    He thinks it's worse with increase.   New neighbor who has a young boy. He worries that Reginal Lutes is triggering depression  and intrusive SI/HI a couple of days later and lasts a little while. Has had anxiety turning into panic attacks.  This gets associated with depression and SI/HI.  These thoughts are intrusive and he doesn't want to act on them.  Can have HI even towards family with whom he has no negative emotions.  Little things like a bump up in parking lot sets off his depression and anxiety and doesn't seem to handle things.   No sig akathisia.  In the past it's been continuous.   Awakens every 2-3 hours.  Usually goes back to sleep with meds. Has not had alcohol in 20 years  and had some intrusive thoughts without drinking.   Plan: As soon as he feels comfortable increase fluvoxamine to 50 mg AM and 100 mg PM for intrusive thoughts of HI/SI and obs on GF  07/06/22 appt noted: Increased fluvoxamine to 150 mg daily and felt more agitated and some SI so reduced to 100 mg 4 days ago and feels better so far.  Likes the fact it is good for anxiety and obs thoughts.  Wonders if there is a similar med.  Frustrated he can't get relief.  Some improvement in obs on exGF but not resolved. Continue other meds: lithium CR 450 mg HS, lorazepam 0.5 mg QID, Memantine 10 BID, olanzapine 30 mg HS, quetiapine 200 mg HS Tingling and burning in legs at night but doesn't interfere with sleep. Got insurance to pay for [facility] and lost 13#.   No full panic lately. But some anxiety waves.   Still major problems with memory. Plan: Wean fluvoxamine 50 mg daily for a month then 1/2 daily for 2 weeks then stop it DT  NR.  Watch for more anxiety.  07/28/22 TC: Franchot Erichsen, CMA  to Me     07/28/22  4:16 PM Note Mom called reporting patient has been confused, dizzy, and sluggish for the last 3 days. He is now staying with her. She had several questions in regards to this:   Lithium level - she feels he needs one done, last result in Epic was 08/2021 that I see. She would like order sent to [facility] for tomorrow.   Patient is on Wegovy, has been for about a month, and she wonders if this could cause sx.   Patient has meclizine and she is questioning if she should give that for his dizziness considering other sx.   She questions if perhaps he got up during the night and took additional medications, though doesn't know if this happened and if so what he took.    On 3/5 visit he was to wean fluvoxamine to 50 mg qd for a month and then 1/2 tab for 2 weeks and then stop. Mom and pt unsure if he started the wean.    He has F/U 4/4.        Me  to Franchot Erichsen, CMA   CC   07/28/22  5:44  PM Note Yes get lithium level in the AM and don't take any lithium tonight or in the morning before the blood test.     Reginal Lutes should not cause these sx except if he is losing wt quickly or is dehydrated it can cause lithium toxicity and include these sx.  He needs to drink lots of water.   He should go ahead and wean the fluvoxamine as instructed in the last appt.   If the sx get worse, go to the ER.  Me   CC   07/29/22  6:49 PM Note RTC   Sx not as severe as when he had to go to hospital in the past.  Albert [name] got Lithium level today.   Speech is not good .  Confused at times, but better today.  A little tremor .  A little balance problems.  No falls.  No diarrhea Took lihtium 450 last night. Mo says he got up in the middle of the night and moved things around last night and he doesn't rmember.   No change in night time meds.   Gradually reducing fluvoxamine.   No extra lorazepam.     Spoke with mother who verifies the same.   No med changes but push fluids.   Albert Staggers, MD, DFAPA      08/05/22 appt : with parents Pt hosp 08/02/22 with acute kidney injury and somewhat elevated lithium level with some confusion.  Spoke with psych NP at hosp and lithium was decreased to 450 mg HS. No pending appt known with medical doctors about kidney function. Don't feel good and having HI and SI and feels he needs lithium for these issues.  Doesn't feel he should have been discharged.   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 No added meds at night but has been confused at night and getting up multiple times and doing things he doesn't remember.  This concerns parents.  Parents been up at night with him and he doesn't make sense.  Still groggy in the am per mo for an hour.   Plan: Plan: Get lithium level and kidney test ASAP Stop fluvoxamine Reduce lithium 300 mg capsule 1 at night Monday night: reduce to olanzapine to 20 mg at night (stop the 10 mg tablet) and start clozapine prescription. Monday  4/15 : increase clozapine to 100 mg at night and reduce quetiapine to 1/2 of 200 mg tablet and continue olanzapine 20 mg each evening.  08/01/22 & 08/11/22 ED for HI and SI  08/12/22 TC mother:  Mother, Albert [name] called. Per note made by Larinda Buttery. He was referred to [facility].Reporting that he did go up there last night. This morning they called her and told her to come pick him up. They told her he wasn't having homicidal or suicidal thoughts and it would be four to five days of him waiting to go to a facility in [place]. They recommended him for PHP to start 4/16. She picked him up today. He is worried about these thoughts coming back up. Also, [facility] came out yesterday to assess him and offered counseling from [facility]. He wants to do counseling with them.  Any input on care he can receive?  He is so discouraged and wants to be safe.  (She notes that the [facility] gave him- Zyprexa 10mg  at 1:07am and again at 1:25am per the med list- based on his previous med list) He was supposed to be off of Zyprexa10mg . Has been on  Clozapine since Monday. He did go ahead and take it before he left ( 2pills) so he hasn't missed a dose and did go get labwork done. Is wanting to continue the Clozapine, please check the labwork to be able to fill the 100mg  dose.    MD resp:  Continue all meds as RX except go ahead and increase clozapine to 100 mg tablets, 1 each night as soon as he can get the 100 mg tablets from pharmacy.  RX sent.  Trying to speed up his chance of  response by getting it to a hgiher dose.  Clozapine is very effective for HI/SI.  If he gets worsening HI/SI in the interim it will not be caused by meds but just the natural waxing and waining of his sx.  Once clozapine dose gets high enough it should help but avg patient needs 300 mg so we have to just be patient.      08/20/22 appt:  Up to 100 mg clozapine HS SE constipation and using Colace, Miralax.  Been to [facility] twice  since here.   Ongoing continuous HI/SI.   All I  want to do is go to sleep to escape.  Dep causing the thoughts.   Taking Ativan 0.5 mg BID-QID.  He thinks mother not giving him enough of it.  He thinks anxiety triggers HI/SI.  He fears HI and hopes he's strong enough to reisist.  Not angry or wanting to hurt anyone in reality but has the intrusive thoughts. Conc and memory still not good.   Sleep not much different.   Staying with parents currently. Dep gotten worse M worrying he's dependent on lorazepam.   Confusion at night is no longer happening.  Not wandering at night.    Psych med hx extensive including ECT and  risperidone, Zyprexa 30 Latuda 80 which caused akathisia,  Vraylar, Rexulti, aripiprazole 20 mg with akathisia,  Seroquel 1000 mg,  InVega, Geodon, Saphris with side effects, symbyax, Fanapt NR .  Caplyta SE and markedly worse. lithium 1200 SE,  lamotrigine 300 mg, Depakote 2000 mg, Tegretol, Trileptal and several of these in combinations, gabapentin,  N-acetylcysteine, Nuedexta,     Belsomra with no response,  Lunesta no response, trazodone 200 mg,  Xanax, clonazepam, lorazepam less sedation. Buspirone NR Clonidine SE with 2 trials  Viibryd 40 mg for 3 months with diarrhea, protriptyline with side effects,  Trintellix 20 mg,  Parnate 50 mg with no response,   imipramine, venlafaxine,  Emsam 12 mg for 2 months,   bupropion was side effects,  Lexapro 20 mg, sertraline, paroxetine, Deplin, fluoxetine 80, fluvoxamine 150 agitated Auvelity NR at one daily.  SE BID  methylphenidate 60 mg,  Vyvanse, Concerta, strattera, , modafinil,  Memantine worsening SI? Donepezil, complained of HI, Exelon 3 BID rivastigmine DT worsening SI  pramipexole,  amantadine ,  Patient prone to akathisia.  [facility]   Review of Systems:  Review of Systems  Respiratory:  Negative for shortness of breath.   Cardiovascular:  Negative for chest pain and palpitations.  Musculoskeletal:  Positive for back pain.   Neurological:  Positive for dizziness and tremors.       Fidgety  Psychiatric/Behavioral:  Positive for confusion, decreased concentration and suicidal ideas. Negative for agitation, behavioral problems, dysphoric mood, hallucinations, self-injury and sleep disturbance. The patient is nervous/anxious. The patient is not hyperactive.     Medications: I have reviewed the patient's current medications.  Current Outpatient Medications  Medication Sig Dispense Refill  . acetaminophen (TYLENOL) 500 MG tablet Take 1,000 mg by mouth every 6 (six) hours as needed for moderate pain.    . bisacodyl (DULCOLAX) 10 MG suppository Place 1 suppository (10 mg total) rectally as needed for moderate constipation. 12 suppository 0  . cloZAPine (CLOZARIL) 100 MG tablet 1 and 1/2 tablets at night for 3 nights then 2 tablets each night (Patient taking differently: Take 100 mg by mouth at bedtime. 1 and 1/2 tablets at night for 3 nights then 2 tablets each night) 13 tablet  0  . esomeprazole (NEXIUM) 40 MG capsule Take 40 mg by mouth daily.    . fenofibrate (TRICOR) 145 MG tablet Take 145 mg by mouth daily.    . fluticasone (FLONASE) 50 MCG/ACT nasal spray Place 1 spray into both nostrils daily as needed for allergies or rhinitis.    . hydrocortisone (ANUSOL-HC) 2.5 % rectal cream Place 1 application rectally 3 (three) times daily as needed for hemorrhoids.    [name] levothyroxine (SYNTHROID, LEVOTHROID) 75 MCG tablet Take 75 mcg by mouth daily before breakfast.    . linaclotide (LINZESS) 145 MCG CAPS capsule Take 1 capsule (145 mcg total) by mouth daily before breakfast. 30 capsule 1  . lisinopril (ZESTRIL) 5 MG tablet Take 5 mg by mouth daily.    [name] lithium carbonate 300 MG capsule Take 1 capsule (300 mg total) by mouth at bedtime. 30 capsule 1  . meclizine (ANTIVERT) 25 MG tablet Take 1 tablet (25 mg total) by mouth 3 (three) times daily as needed for dizziness. 30 tablet 1  . OLANZapine (ZYPREXA) 20 MG tablet TAKE ONE  TABLET BY MOUTH AT BEDTIME 30 tablet 0  . polycarbophil (FIBERCON) 625 MG tablet Take 1,250 mg by mouth daily.    . sildenafil (VIAGRA) 100 MG tablet Take 1 tablet (100 mg total) by mouth daily as needed for erectile dysfunction. 30 tablet 6  . tamsulosin (FLOMAX) 0.4 MG CAPS capsule Take 1 capsule (0.4 mg total) by mouth daily. 90 capsule 3  . LORazepam (ATIVAN) 0.5 MG tablet Take 1 tablet (0.5 mg total) by mouth 4 (four) times daily. 120 tablet 1  . WEGOVY 1 MG/0.5ML SOAJ Inject 1 mg into the skin once a week. (Patient not taking: Reported on 08/20/2022)     No current facility-administered medications for this visit.   Medication Side Effects: Other: mild sleepiness.   Occ twitches.\, tremor  Allergies:  Allergies  Allergen Reactions  . Meloxicam Other (See Comments)    Dizziness  . Nsaids Other (See Comments)    Hallucinations  . Ibuprofen Other (See Comments)    Can not take because taking lithium  . Prednisone Other (See Comments)    Can't sleep   . Wellbutrin [Bupropion] Other (See Comments)    Suicidal thoughts    Past Medical History:  Diagnosis Date  . ADHD (attention deficit hyperactivity disorder)   . Anemia   . Anxiety   . Bipolar disorder   . BPH (benign prostatic hyperplasia)   . Chronic kidney disease, stage 3b   . Constipation   . DDD (degenerative disc disease), cervical   . Depression   . Deviated septum   . ED (erectile dysfunction)   . GERD (gastroesophageal reflux disease)   . Graves disease   . History of hiatal hernia   . Hypertension   . 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 Hypothyroidism   . Pneumonia   . PONV (postoperative nausea and vomiting)     Family History  Problem Relation Age of Onset  . Prostate cancer Neg Hx   . Bladder Cancer Neg Hx   . Kidney cancer Neg Hx     Social History   Socioeconomic History  . Marital status: Single    Spouse name: Not on file  . Number of children: Not on file  .  Years of education: Not on file  . Highest education level: Not on file  Occupational History  . Not on file  Tobacco Use  .  Smoking status: Former    Types: Cigarettes    Passive exposure: Past  . Smokeless tobacco: Former    Types: Engineer, drilling  . Vaping Use: Never used  Substance and Sexual Activity  . Alcohol use: Not Currently  . Drug use: Never  . Sexual activity: Yes  Other Topics Concern  . Not on file  Social History Narrative  . Not on file   Social Determinants of Health   Financial Resource Strain: Not on file  Food Insecurity: Not on file  Transportation Needs: Not on file  Physical Activity: Not on file  Stress: Not on file  Social Connections: Not on file  Intimate Partner Violence: Not on file    Past Medical History, Surgical history, Social history, and Family history were reviewed and updated as appropriate.   Please see review of systems for further details on the patient's review from today.   Objective:   Physical Exam:  There were no vitals taken for this visit.  Physical Exam Constitutional:      General: He is not in acute distress.    Appearance: He is well-developed.  Musculoskeletal:        General: No deformity.  Neurological:     Mental Status: He is alert and oriented to person, place, and time.     Cranial Nerves: No dysarthria.     Motor: Tremor present.     Coordination: Coordination normal.     Comments: mild tremor  Psychiatric:        Attention and Perception: Attention and perception normal. He does not perceive auditory or visual hallucinations.        Mood and Affect: Mood is anxious and depressed. Affect is not labile, blunt, angry, tearful or inappropriate.        Speech: Speech normal. Speech is not slurred.        Behavior: Behavior normal. Behavior is not slowed. Behavior is cooperative.        Thought Content: Thought content is not delusional. Thought content includes suicidal ideation. Thought content does  not include homicidal ideation. Thought content does not include suicidal plan.        Cognition and Memory: Cognition normal. He exhibits impaired recent memory.        Judgment: Judgment normal.     Comments: Insight fair Chronically talkative .directable. He is chronically anxious  worse with less lithium No manic signs noted. Some wordfinding problems ongoing. Intrusive thoughts lately trigger SI Fidgety today but not uncomfortable Alert and oriented.     Lab Review:     Component Value Date/Time   NA 133 (L) 08/12/2022 0029   NA 138 08/05/2022 1630   NA 142 03/20/2014 0514   K 3.9 08/12/2022 0029   K 4.1 03/20/2014 0514   CL 103 08/12/2022 0029   CL 112 (H) 03/20/2014 0514   CO2 19 (L) 08/12/2022 0029   CO2 26 03/20/2014 0514   GLUCOSE 101 (H) 08/12/2022 0029   GLUCOSE 99 03/20/2014 0514   BUN 14 08/12/2022 0029   BUN 15 08/05/2022 1630   BUN 7 03/20/2014 0514   CREATININE 1.85 (H) 08/12/2022 0029   CREATININE 0.78 03/20/2014 0514   CALCIUM 9.6 08/12/2022 0029   CALCIUM 9.5 03/20/2014 0514   PROT 6.7 08/12/2022 0029   PROT 7.3 03/18/2014 1459   ALBUMIN 4.2 08/12/2022 0029   ALBUMIN 3.2 (L) 03/18/2014 1459   AST 18 08/12/2022 0029   AST 16 03/18/2014 1459  ALT 14 08/12/2022 0029   ALT 16 03/18/2014 1459   ALKPHOS 24 (L) 08/12/2022 0029   ALKPHOS 105 03/18/2014 1459   BILITOT 0.5 08/12/2022 0029   BILITOT 0.3 03/18/2014 1459   GFRNONAA 42 (L) 08/12/2022 0029   GFRNONAA >60 03/20/2014 0514   GFRAA 54 (L) 04/18/2019 1612   GFRAA >60 03/20/2014 0514       Component Value Date/Time   WBC 5.2 08/18/2022 1033   WBC 6.8 08/12/2022 0029   RBC 4.04 (L) 08/18/2022 1033   RBC 3.72 (L) 08/12/2022 0029   HGB 12.8 (L) 08/18/2022 1033   HCT 37.6 08/18/2022 1033   PLT 484 (H) 08/18/2022 1033   MCV 93 08/18/2022 1033   MCV 89 03/20/2014 0514   MCH 31.7 08/18/2022 1033   MCH 31.5 08/12/2022 0029   MCHC 34.0 08/18/2022 1033   MCHC 33.4 08/12/2022 0029   RDW 11.8  08/18/2022 1033   RDW 12.5 03/20/2014 0514   LYMPHSABS 1.5 08/18/2022 1033   LYMPHSABS 3.2 03/20/2014 0514   MONOABS 0.5 08/12/2022 0029   MONOABS 1.0 03/20/2014 0514   EOSABS 0.4 08/18/2022 1033   EOSABS 0.4 03/20/2014 0514   BASOSABS 0.1 08/18/2022 1033   BASOSABS 0.1 03/20/2014 0514    Lithium Lvl  Date Value Ref Range Status  08/05/2022 1.1 0.5 - 1.2 mmol/L Final    Comment:    A concentration of 0.5-0.8 mmol/L is advised for long-term use; concentrations of up to 1.2 mmol/L may be necessary during acute treatment.                                  Detection Limit = 0.1                           <0.1 indicates None Detected    08/12/21 lithium level 0.9 on 450 mg.  lithium level Sept 0.8.   lithium level July 27, 2018 was normal at 1.0.   Lithium level [facility] October 03, 2018 = 1.2. Said he got lithium level at [facility] as requested.  Labs not in Epic.  Recent lipids ok except higher TG than usual.  Normal A1C.  No results found for: "PHENYTOIN", "PHENOBARB", "VALPROATE", "CBMZ"   .res Assessment: Plan:    Cledis was seen today for follow-up, depression, anxiety and manic behavior.  Diagnoses and all orders for this visit:  Severe bipolar I disorder, current or most recent episode depressed  Drug-induced constipation -     linaclotide (LINZESS) 145 MCG CAPS capsule; Take 1 capsule (145 mcg total) by mouth daily before breakfast.  GAD (generalized anxiety disorder)  Long term current use of clozapine  Generalized anxiety disorder -     LORazepam (ATIVAN) 0.5 MG tablet; Take 1 tablet (0.5 mg total) by mouth 4 (four) times daily.  Panic disorder with agoraphobia -     LORazepam (ATIVAN) 0.5 MG tablet; Take 1 tablet (0.5 mg total) by mouth 4 (four) times daily.  Insomnia due to mental condition  Mild cognitive impairment    Chronic TR bipolar mixed and chronic anxiety.  He usually has mixed bipolar symptoms which we have not been able to completely eliminate.  See  long list of meds tried.   He is somewhat chronically unstable but better on olanzapine worse if reduces quetiapine quickly.  Recent hosp 08/02/22 with SI and acute kidney injury and elevated lithium.  Processed this and disc goal of trying to stop lithium but risk of worsening SI.  He has chronic SI and there's risk of this worsening off lithium.  Lithium and clozapine are the most effective meds for SI.  Possible that [facility] made him dehydrated and constipation and caused incr lithium level.  Had 15-20 # wt loss.   Discussed mother's concerns about his memory which is primarily a short-term memory issue.  We discussed the alternative options of using Namenda off label for mild cognitive impairment.  We are not yet able to reduce the dose of lorazepam but that could potentially help as well.  His memory was much worse on Xanax than lorazepam. We discussed the short-term risks associated with benzodiazepines including sedation and increased fall risk among others.  Discussed long-term side effect risk including dependence, potential withdrawal symptoms, and the potential eventual dose-related risk of dementia. Disc newer studies refute dementia risks.  Unfortunately due to the severity of set his symptoms polypharmacy is a necessity.  Disc unusual combo antipsychotics but it helps more than other options.  Discussed potential metabolic side effects associated with atypical antipsychotics, as well as potential risk for movement side effects. Advised pt to contact office if movement side effects occur.  He had some falling around the time when he previously took olanzapine but he took it for several months at this dosage before he had any side effect issues and he was on Xanax at the time that the following occurred. Take LED Seroquel to sleep.   No obvious alternatives for sleep.    Lithium being used bc chronic SI and death thoughts.   Counseled patient regarding potential benefits, risks, and side  effects of lithium to include potential risk of lithium affecting thyroid and renal function.  Discussed need for periodic lab monitoring to determine drug level and to assess for potential adverse effects.  Counseled patient regarding signs and symptoms of lithium toxicity and advised that they notify office immediately or seek urgent medical attention if experiencing these signs and symptoms.  Patient advised to contact office with any questions or concerns. reduce lithium  300 mg daily.  Will plan to stop if clozapine starts working. Check lithium level and BMP Lithium level 0.7 on 3/115/23 on 300 mg daily Call if death thoughts worsen or worsening SI.   Disc SE. Disc combo with Seroquel may lower response rate but he doesn't think he can sleep without Seroquel.  Reduced olanzapine to 20 mg nightly  for more racing thoughts and anxiety with less lithium DT severity of sx and difficulty getting off Seroquel conside rincrease.  Disc in detail again clozapine.  Disc data showing less SI with it.  Disc risk in detail including low WBC with complication, myocarditis.  Extensive discussion of CBC monitoring.  Disc this again.  He doesn't want to do this now  Disc this again bc taking Wegovy might help him to tolerate it better.  He's started [facility]  Disc WD  and may need to go lower before he can stop it. DC quetiapine to minimize meds and constipation  Did he take duloxetine?  Might help depression more.  Consider retry pramipexole off label   But for now continue lorazepam 0.5 mg QID, but use LED for cog reasons  For [facility] had to stop rivastigmine DT worsening SI  CBT dealing with obsession around old GF discussed  We discussed the short-term risks associated with benzodiazepines including sedation and increased fall risk among others.  Discussed long-term side effect risk including dependence, potential withdrawal symptoms, and the potential eventual dose-related risk of dementia.  But recent  studies from 2020 dispute this association between benzodiazepines and dementia risk. Newer studies in 2020 do not support an association with dementia.  Discussed safety plan at length with patient.  Advised patient to contact office with any worsening signs and symptoms.  Instructed patient to go to the [facility] emergency room for evaluation if experiencing any acute safety concerns, to include suicidal intent.  He commits to safety.  Disc Ozempic and Monjauro for weight loss.  Has had weight gain from meds as a contributor. But lately has lost some weight.  Disc Dr. Pollie Meyer mentioned evidence it could help depression.  I rec he continue this also.  Unlikely to worsen SI  Needs to keep up activity as much as possible for depression.  Take Miralax daily for constipation.  Constipation management 1.  Lots of water 2.  Powdered fiber supplement such as MiraLAX, Citrucel, etc. preferably with a meal 3.  2 stool softeners a day 4.  Milk of magnesia or magnesium tablets if needed For severe constipation start Linzess 145 mg AM  Plan: Get lithium level and kidney test ASAP Stopped fluvoxamine and confusion at night is gone  lithium 300 mg capsule 1 at night  olanzapine to 20 mg at night  increase clozapine to 150 mg at night for 3 nights then 200 mg HS   olanzapine 20 mg each evening. Continue lorazepam 0.5 mg QID, needs this and informed mother  This appt was 90 mins.  FU 2 weeks   Albert Staggers, MD, DFAPA  Future Appointments  Date Time Provider Department Center  09/07/2022  1:00 PM Cottle, Steva Ready., MD [facility] None  10/06/2022  1:00 PM Cottle, Steva Ready., MD [facility] None  12/30/2022  2:30 PM Sondra Come, MD [facility] None    No orders of the defined types were placed in this encounter.      -------------------------------

## 2022-08-25 ENCOUNTER — Telehealth: Payer: Self-pay | Admitting: Psychiatry

## 2022-08-25 ENCOUNTER — Other Ambulatory Visit: Payer: Self-pay | Admitting: Psychiatry

## 2022-08-25 NOTE — Telephone Encounter (Signed)
Patient reports the Linzess is helping with constipation and he will continue it.

## 2022-08-25 NOTE — Telephone Encounter (Signed)
Albert [name] called in at 2:30. He is taking the Linzess for constipation and is getting some relief from that. He will continue it. Also reporting that he got labs done today for Clozapine. No need for call back.

## 2022-08-26 ENCOUNTER — Telehealth: Payer: Self-pay | Admitting: Psychiatry

## 2022-08-26 LAB — CBC WITH DIFFERENTIAL/PLATELET
Basophils Absolute: 0.1 10*3/uL (ref 0.0–0.2)
Basos: 1 %
EOS (ABSOLUTE): 0.7 10*3/uL — ABNORMAL HIGH (ref 0.0–0.4)
Eos: 11 %
Hematocrit: 36.8 % — ABNORMAL LOW (ref 37.5–51.0)
Hemoglobin: 12.9 g/dL — ABNORMAL LOW (ref 13.0–17.7)
Immature Grans (Abs): 0 10*3/uL (ref 0.0–0.1)
Immature Granulocytes: 0 %
Lymphocytes Absolute: 1.3 10*3/uL (ref 0.7–3.1)
Lymphs: 23 %
MCH: 31.5 pg (ref 26.6–33.0)
MCHC: 35.1 g/dL (ref 31.5–35.7)
MCV: 90 fL (ref 79–97)
Monocytes Absolute: 0.4 10*3/uL (ref 0.1–0.9)
Monocytes: 6 %
Neutrophils Absolute: 3.4 10*3/uL (ref 1.4–7.0)
Neutrophils: 59 %
Platelets: 441 10*3/uL (ref 150–450)
RBC: 4.09 x10E6/uL — ABNORMAL LOW (ref 4.14–5.80)
RDW: 11.6 % (ref 11.6–15.4)
WBC: 5.8 10*3/uL (ref 3.4–10.8)

## 2022-08-26 NOTE — Telephone Encounter (Signed)
Mom requesting RF on clozapine. ANC yesterday 3.4.

## 2022-08-26 NOTE — Telephone Encounter (Signed)
Pt's mom called at 5:01p.   She is on DPR. They need the weekly script for Clozapine sent to  [facility] - [place], [place] - [address] [place], [place] [address] Phone: [phone]  Fax: [phone]   Pt only has 2 pills left for tonight.  Next appt 5/7

## 2022-08-27 ENCOUNTER — Other Ambulatory Visit: Payer: Self-pay

## 2022-08-27 DIAGNOSIS — F314 Bipolar disorder, current episode depressed, severe, without psychotic features: Secondary | ICD-10-CM

## 2022-08-27 MED ORDER — CLOZAPINE 100 MG PO TABS
ORAL_TABLET | ORAL | 0 refills | Status: DC
Start: 2022-08-27 — End: 2022-09-02

## 2022-08-27 NOTE — Telephone Encounter (Signed)
Labs put in [facility],

## 2022-08-27 NOTE — Telephone Encounter (Signed)
Labs are in [facility], I sent Rx for Clozapine 100 mg take 2 at hs #14 to [facility]

## 2022-09-01 ENCOUNTER — Other Ambulatory Visit: Payer: Self-pay

## 2022-09-01 ENCOUNTER — Other Ambulatory Visit: Payer: Self-pay | Admitting: Psychiatry

## 2022-09-01 ENCOUNTER — Telehealth: Payer: Self-pay | Admitting: Psychiatry

## 2022-09-01 DIAGNOSIS — K5903 Drug induced constipation: Secondary | ICD-10-CM

## 2022-09-01 DIAGNOSIS — F314 Bipolar disorder, current episode depressed, severe, without psychotic features: Secondary | ICD-10-CM

## 2022-09-01 MED ORDER — LINACLOTIDE 290 MCG PO CAPS
290.0000 ug | ORAL_CAPSULE | Freq: Every day | ORAL | 2 refills | Status: DC
Start: 2022-09-01 — End: 2022-09-14

## 2022-09-01 NOTE — Telephone Encounter (Signed)
Yes he needs to stay on clozapine and allow [place] to adjust the dose until it helps.  And we have to find a solution to the constipation problem.  He has to take something daily for it.  We can increase Linzess to double dose.  Or we could switch to Lactulose.  Either option He also needs to increase the clozapine by another 50 mg as soon as he can for the racing thoughts and HI/SI thoughts.

## 2022-09-01 NOTE — Telephone Encounter (Signed)
Noted he could just double the lower dose Linzess he already has, but ok to send in the higher dose.

## 2022-09-01 NOTE — Telephone Encounter (Signed)
Rtc to pt and he reports having suicidal and homicidal thoughts and feels like its not any better since he started Clozapine. Advised him that Dr. Jennelle Human isn't going to stop the medication at this point and he needs to stick with it. He had labs and I will look for those. He reports trying everything Dr. Jennelle Human told him to try for the issue with constipation; Linzess, enemas, milk of magnesium, he was rambling about the different ones. Not sure if he's tried Lactulose, that was used quite a bit in the hospital for an fyi. He asked to maybe decrease his dose of Clozapine, advised him that I doubt he would be doing that either. He has an upcoming apt on 09/07/22.

## 2022-09-01 NOTE — Telephone Encounter (Signed)
Marshall called and said he had his labs done today. He has been having constipation issues. Tried fleet enemas and had some results but not a lot. He is also having racing thoughts a lot. He stated that he is having homicidal and suicidal thoughts. He asked for someone to please call him soon as possible. Phone number is [phone].

## 2022-09-01 NOTE — Telephone Encounter (Signed)
Rtc to pt and gave instructions, he verbalized understanding and will do as advised. Informed him an updated Clozapine Rx will be sent tomorrow after labs are verified to [facility]. He asked for the Linzess Rx be sent to [facility], informed him that would be sent today.

## 2022-09-02 ENCOUNTER — Other Ambulatory Visit: Payer: Self-pay | Admitting: Psychiatry

## 2022-09-02 DIAGNOSIS — F314 Bipolar disorder, current episode depressed, severe, without psychotic features: Secondary | ICD-10-CM

## 2022-09-02 LAB — CBC WITH DIFFERENTIAL/PLATELET
Basophils Absolute: 0.1 10*3/uL (ref 0.0–0.2)
Basos: 2 %
EOS (ABSOLUTE): 0.5 10*3/uL — ABNORMAL HIGH (ref 0.0–0.4)
Eos: 10 %
Hematocrit: 38 % (ref 37.5–51.0)
Hemoglobin: 12.8 g/dL — ABNORMAL LOW (ref 13.0–17.7)
Immature Grans (Abs): 0 10*3/uL (ref 0.0–0.1)
Immature Granulocytes: 0 %
Lymphocytes Absolute: 1.2 10*3/uL (ref 0.7–3.1)
Lymphs: 26 %
MCH: 31.1 pg (ref 26.6–33.0)
MCHC: 33.7 g/dL (ref 31.5–35.7)
MCV: 93 fL (ref 79–97)
Monocytes Absolute: 0.4 10*3/uL (ref 0.1–0.9)
Monocytes: 8 %
Neutrophils Absolute: 2.6 10*3/uL (ref 1.4–7.0)
Neutrophils: 54 %
Platelets: 495 10*3/uL — ABNORMAL HIGH (ref 150–450)
RBC: 4.11 x10E6/uL — ABNORMAL LOW (ref 4.14–5.80)
RDW: 11.7 % (ref 11.6–15.4)
WBC: 4.7 10*3/uL (ref 3.4–10.8)

## 2022-09-02 MED ORDER — CLOZAPINE 100 MG PO TABS
ORAL_TABLET | ORAL | 1 refills | Status: DC
Start: 2022-09-02 — End: 2022-09-07

## 2022-09-02 MED ORDER — CLOZAPINE 100 MG PO TABS
ORAL_TABLET | ORAL | 1 refills | Status: DC
Start: 2022-09-02 — End: 2022-09-02

## 2022-09-02 NOTE — Telephone Encounter (Signed)
Rx had been sent to [facility]. I sent to the correct pharmacy.

## 2022-09-02 NOTE — Telephone Encounter (Signed)
His labs are in [facility]

## 2022-09-02 NOTE — Telephone Encounter (Signed)
Verified that pharmacy had received the Rx. Notified patient that it had been sent to the correct pharmacy. He was asking about labs. Verified that they were in [facility].

## 2022-09-02 NOTE — Telephone Encounter (Signed)
Addendum to attached message  on 5/2 @ 0857 am. Albert [name]'s mom just called and said that Albert [name]'s Clozapine must be called in to [facility] at [phone]. He needs to pick it up by this afternoon before they close. Per Albert [name], Clozapine must be called into:  [facility] - [place], [place] - [address]   Phone: [phone]  Fax: [phone]   And not called into:  [facility] - [place], [place] - [address]   Phone: [phone]  Fax: [phone]

## 2022-09-02 NOTE — Telephone Encounter (Signed)
Noted, he will use up what he has then fill the next dose if its working.

## 2022-09-07 ENCOUNTER — Ambulatory Visit (INDEPENDENT_AMBULATORY_CARE_PROVIDER_SITE_OTHER): Payer: Medicare PPO | Admitting: Psychiatry

## 2022-09-07 ENCOUNTER — Encounter: Payer: Self-pay | Admitting: Psychiatry

## 2022-09-07 DIAGNOSIS — Z79899 Other long term (current) drug therapy: Secondary | ICD-10-CM | POA: Diagnosis not present

## 2022-09-07 DIAGNOSIS — F411 Generalized anxiety disorder: Secondary | ICD-10-CM | POA: Diagnosis not present

## 2022-09-07 DIAGNOSIS — F4001 Agoraphobia with panic disorder: Secondary | ICD-10-CM

## 2022-09-07 DIAGNOSIS — F5105 Insomnia due to other mental disorder: Secondary | ICD-10-CM

## 2022-09-07 DIAGNOSIS — F314 Bipolar disorder, current episode depressed, severe, without psychotic features: Secondary | ICD-10-CM

## 2022-09-07 DIAGNOSIS — G3184 Mild cognitive impairment, so stated: Secondary | ICD-10-CM

## 2022-09-07 DIAGNOSIS — K5903 Drug induced constipation: Secondary | ICD-10-CM | POA: Diagnosis not present

## 2022-09-07 MED ORDER — CLOZAPINE 100 MG PO TABS
400.0000 mg | ORAL_TABLET | Freq: Every day | ORAL | 1 refills | Status: DC
Start: 2022-09-07 — End: 2022-09-22

## 2022-09-07 NOTE — Patient Instructions (Signed)
Increase clozapine to 3 tablets at night until constipation resolved and then increase to 4 tablets at night.

## 2022-09-07 NOTE — Progress Notes (Signed)
Albert [name] [id number] 08/27/1965 57 y.o.    Subjective:   Patient ID:  Albert [name] is a 57 y.o. (DOB 1966-04-03) male.  Chief Complaint:  Chief Complaint  Patient presents with   Follow-up   Depression   Anxiety   Medication Problem    Depression        Associated symptoms include decreased concentration and suicidal ideas.  Past medical history includes anxiety.   Anxiety Symptoms include confusion, decreased concentration, dizziness, nervous/anxious behavior and suicidal ideas. Patient reports no chest pain, palpitations or shortness of breath.      Albert [name] presents to the office today for follow-up of mixed bipolar, anxiety and still grieving stress of breakup.  He requires frequent follow-up because of long-term symptoms.  At visit October 25, 2018.  We made several medicine changes because of his concerns about sleep and other issues.  We increased olanzapine back to 7.5 mg bc not sleeping as well at 5 mg.  He has to have the prescription for quetiapine written for up to 3 tablets at night in case insomnia is worse because insomnia makes his mood disorder so much worse.  We discussed that was above the usual max but the request was granted given his treatment resistant status. We also reduce lithium from 900 mg daily to 750 mg daily to try to reduce tremor and muscle twitches.  At  visit March 28, 2019.  Fluoxetine was increased to 40 mg daily.   Early January 2021 increased to 60 mg daily.  No SE.  seen June 28, 2019.  No further meds were changed except olanzapine was increased back to 10 mg daily to see if if he could get additional benefit per his request.  Covid vaccinated.  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  Then had cholecystectomy.  April 2021 appt with the following noted: 2 episodes night sweats lately.  Memory has been very bad lately.  Repeatedly asks mother questions. Still  tend to stay in his room and his bed.  Still has rapid cycling mood swings.  Maybe  some better with increase in olanzapine to 10 and tolerating itl.  Would like to get out more but can't DT Covid.  Can perform necessary chores.  Will get out of the house when he can.  No change in death thoughts and anxiety in intensity but is better with frequency.  Usually comes and goes in waves but more persistent.  Consistent with meds.  Ativan not helping anxiety very dramatically but he's not sure.  Fidgety.  No trigger other than still grieving relationship and can't get it out of his head.  Poor energy, concentration and more forgetful. In bed more and less active.  Sleep ok lately which is unusual.  Can concentrate on financial matters and stock market at times.  Tolerating meds.  Still some intrusive SI without reason. Less frequent obsessive thoughts about broken relationship and has been doing this for months.  Can't let it go.  Loop.   No unusual stress even with the family who is supportive. Likes the benefit that Zyprexa gives. No sleepwalking nor falling nor odd behavior.  No history of sleep walking.  He understands this may recur with an increase.  Also wants option to rarely take extra quetiapine 300 for sleep prn. Plan:  Try to reduce lorazepam if possible.  10/22/2019 appointment with the following noted: Continues fluoxetine 60, lithium 600 mg, lorazepam 2 mg AM and HS and 1 mg  midday, olanzapine 10, quetiapine 600 mg HS. Still anxious chronically and including driving in crowded spaces.  Doesn't thing Ativan helps as well as Xanax but less cognitive problems. Sleep is not as good.  Occ EFA.  More EMA and wanting to do things in the middle of the night but this is not typical.  Wonders about why that happens.  Some napping.   Tolerating meds.  Asks about weight loss meds.  Disc this in detail.   Plan no changes except OK meclizine prn vertigo.  02/11/20 appt with the following noted: Able to gradually reduce lorazepam to 1 mg AM and HS. More depressed over time and less  interested in things and less interest in going out but does with his parents. Has reduced from 800 to 600 mg HS with some awakening but usually able to go back to sleep.[name]  Spending a good amount of time in bed bc watches TV in bedroom.  Parents watch TV in different part of the house.  No mood swings he notices.   Concerns about weight gain about 200#. Likes olanzapine's benefit for sleep. Plan: Trial for TRD to  Increase fluoxetine to 80 mg daily  to use the combo with olanzapine for TR bipolar depression.   04/21/20 appt with following noted: I thought in beginning some benefit with fluoxetine.  Lifelong negative thinking continues.   Not much bipolar.  Reads a lot on bipolar.   Still tired and anxious a little more may be seasonal.  No familial stressors.  Tends to lay down in afternoon.  Anxiety not over anything in particular.   No SE with fluoxetine. Took meclizine prn.  Easily motion sick.    Asks questions about newer drugs for bipolar like Caplyta. Plan:  No med changes  06/30/20 appt noted: Tired of wearing masks.  Vaccinated.  Asked questions about when this will end with mask mandate.  Mood up and down some since here and getting up 2-3 times.  Disc awakening problems.  Trying to do better bc night eating some.   Mood is about average today so far.  [name] out with friends for breakfast.  Recognizes activity helps mood.   3 days in a row napped a lot in the afternoon.   Bed is a comfort place.  Gets bored and hard to motivate.  Tries to stay away from night eating and spending.   More depressed when alone and wonders about med changes. Plan: Failed response to olanzapine + fluoxetine for TR bipolar depression.   Reduce fluoxetine to 1 daily and reduce olanzapine to one half nightly for 10 days and then stop it. Then start Caplyta 1 daily  08/27/2020 appointment with the following noted: Patient decompensated with the above switch to Caplyta with intrusive suicidal thoughts and had  to be hospitalized for psychiatric reasons.  Multiple phone calls with family members since that time.  Spoke with the psychiatric nurse practitioner with the decision to restart olanzapine in place of the Caplyta given the patient was more stable while on the combination of Seroquel and olanzapine than with the Caplyta. Caplyta triggered HI/SI and depressed and confused on it, and agitated and still has some of it now. No akathisia. Frustrated with lack of therapy at the hospital.   Hydroxyzine didn't help jitteriness.  Feels some better.  Fleeting SI & HI but not obsessive like it was prior to hospitalization.  Feels a little amped up and nervous.  Scared of what could happen.  Apprehensive  being here talking about things. 5-6 hours sleep last night and would like to have more. At mom and dad's house right now and up more in the day. Inadvertently stopped lorazepam abruptly by mistake likely contributing to shakiness. Still some memory issues.  Trying to stay out of bed when watching TV.  Some awakening but managing. Occ fleeting SI and distracts himself.   Always spends a lot of time just laying around.   Can have anxiety for no reason like coming here, and varies in intensity without pattern.      Less panic than in the past.  Some chronic depression and hyperactivity and loudness and hyperverbal.  Usually back to sleep.  Total 6 hours but some napping, awakens 2-4 times nightly but back to sleep..  Taking quetiapine 600 mg HS.  still lacks interest and motivation.  Can follow a TV show if interested. Plan: Restart lorazepam 1 mg in the morning, 1 mg in the afternoon and 1 mg at night for anxiety Stop hydroxyzine  Increase olanzapine to 1 and 1/2 of 10 mg tablets in evening  09/05/2020 appt noted: seen with parents today at his request Made med changes noted and tolerated the changes Better than I did last week. Now only fleeting HI/SI and "no where near what it was".  Sleeping better.  Still not a  lot of energy.  Anhedonia.  Less agitation.  A lot of anxiety all the time but it's helping.  Would like more energy and motivation.   Doesn't handle stress well. Parents note he's less shakey.  He's still staying with his parents.  Only driven once since this happened and F wants him to be able to drive and function more independently.  M agrees he's better.  Memory is better but not normal.  Primarily STM problems No akathisia with olanzapine this time.    Administering his own meds but mo watched. Slept 8 hours last night. Plan consider further reductions in quetiapine  10/17/20 appt noted: Stayed on Seroquel 600 HS and olanzapine 15. Olanzapine seemed to do most for the sleep.  Also helping eliminated HI/SI for the most part.  Wants to try to increase it.  Depression is much better with less rumination also.  Fears akathisia at higher dose as in the past. Also on fluoxetine 40.   Plan: Reduce Seroquel to 1 and 1/2 tablets at night Increase olanzapine to 1 of the 15 mg tablet and 1/2 of the 5 mg tablet for 1 week,  Then, if tolerated increase the olanzapine to 20 mg or 1 of the 15 mg tablets and 1 of the 5 mg tablets. If possible then also reduce quetiapine to 1 tablet at night.  11/05/2020 appointment with the following noted: Up to olanzapine 20 mg hs for 3 days.  Reduced Serooquel to 450 mg HS.  Didn't try to go lower. Had hiatal hernia and umbilical hernia surgery recently with no problem.   No akathisia so far. Sleep ok with meds so far changes.  No mood changes yet but overall likes the smoothness of olanzapine and helping sleep without knociing him out. Still some occ HI/SI, he thinks bc of anxiety generally.  Easily anxious. Plan: Continue olanzapine 20 mg nightly longer to give it time to help mood sx reduce quetiapine to 1 tablet at night to minimize polypharmacy and reduce risk of akathisia.  01/07/21 appt noted: Able to reduce quetiapine 300 mg HS ok with decent sleep but still  wakes a lot.  No AM hangover.  Don't have a lot to do.  Can enjoy going out and doing things.  But doesn't do it longterm.  Family is doing good.  Father started year 26 at his job.   Tolerating the meds well.  May wake more with quetiapine reduction. Lost 10#. Wonders if could try other meds he didn't tolerate before bc now can tolerate olanzapine and couldn't tolerate it in the past DT akathisia. Mood never great but can swing into depresssion without reason.   Plan: Continue olanzapine 20 mg nightly longer to give it time to help mood sx reduce quetiapine to 1/2 of the 300 mg  tablet at night to minimize polypharmacy and reduce risk of akathisia.  02/02/2021 appointment with the following noted: Tried reduced quetiapine to 150 mg and after a week had withdrawal sx including jittery and SI and he increased to 250 mg daily.  Felt better after increasing to 300 mg daily and last week reduced to 250 mg daily.  After increase quetiapine felt better in a few days.  Seems more alert with less quetiapine but can't tolerate a quick withdrawal.   Some awakening.  Plan: Reduce quetiapine by 50 mg every 2-4 weeks.  03/09/2021 phone call: Pt called and said that he is weaning off the seroquel. He was down to 200 mg of the seoquel and he started having withdrawal symptoms and sucidal thoughts. He went back up to 250 mg because he knew he was good on that.    Pt stated he starting back taking 250 mg on Saturday due to the symptoms he was having.Now he is no longer having suicidal thoughts but still very jittery. MD: He may just need to taper slower than most people.  If he can tolerate the 250 mg Seroquel with the jitteriness it should get better over the next 1 to 2 weeks.  Then he can try going down again by 50 mg at a time.  He probably needs to go down by just 50 mg every 2 to 4 weeks and wait till he fully adjusts to each reduction before he reduces again.  03/17/21 appt noted: Hernia surgery 2 weeks  ago. Seroquel 250 mg for a month then 200 mg daily and had SI and increased again to 300 mg daily.  Would love to get off it.  Withdrawal triggers intrusive HI/SI but otherwise doesn't have them No SE. Sleep is OK but not enough REM sleep.  Satisfied with other meds.   No current HI/SI.  No paranoia.  Still has anxiety and taking Ativan every 6 hours.  It works but doesn't cure it. Chronic anxiety and ask about ketamine for chronic depression. Plan: Because he had WD reducing from 250 to 200 mg daily will have to go slowly. Reduce quetiapine by 25 mg every 2-4 weeks. Reduce Seroquel to 250 mg for 2 weeks, Then reduce to 225 mg for 2 weeks, Then reduce to 200 mg for 2 weeks, Then reduce to 175 mg for 2 weeks, Then reduce to 150 mg Also: Buspirone 30 mg tablets for anxiety Start 1/3 tablet twice daily for 1 week Then increase to two thirds twice daily for 1 week Then increase to 1 tablet twice daily  04/15/2021 appointment with the following noted: Reduced Seroquel to 200 mg HS and increased buspirone to 30 mg BID STM px and wants to try to reduce Ativan to see if it is better.  But fears with drawal sx. Parents complain about his memory.  Less HI and SI since here. Less depressed. Plan: Buspirone 30 mg tablets for anxiety Start 1/3 tablet twice daily for 1 week Then increase to two thirds twice daily for 1 week Then increase to 1 tablet twice daily Continue olanzapine 20 mg nightly longer to give it time to help mood sx Because he had WD reducing from 250 to 200 mg daily will have to go slowly. Reduce quetiapine by 25 mg every 2-4 weeks. Reduce Seroquel to 250 mg for 2 weeks, Then reduce to 225 mg for 2 weeks, Then reduce to 200 mg for 2 weeks, Then reduce to 175 mg for 2 weeks, Then reduce to 150 mg Trial taper lorazepam: In hopes of improving short-term memory  04/30/2022 phone call: Complaining of withdrawal symptoms reducing lorazepam from 0.5 mg 5 times a day to 0.5 mg 4  times daily.  05/21/2021 appointment with the following noted: Gotten onto buspar 30 BID and down on lorazepam to 0.5 mg QID. Gotten down to Seroquel 200 mg HS A little restless every now and then but able to reduce lorazepam for the last weeks. Is too inactive and lays in bed too much watching TV.   Can' t tell effect of buspirone bc of other changes.  Sleep with awakening with 6-7 hours. Fleeting SI without trigger or plan or intent. Depressed more than anxious. Enjoys watching stocks and investing. Plan: Trial taper lorazepam gradually over time In hopes of improving short-term memory But for now continue lorazepam 0.5 mg QID Start clonidine 0.1 mg tablets one half at night for 3 nights, then 1/2 tablet twice daily for 3 nights, then one half in the morning and 1 tablet at night If clonidine does not noticeably help anxiety call the office  05/27/2021 phone call initially complaining of homicidal and suicidal thoughts after taking 1 mg of clonidine.  He was told to stop the medication.  But then called back stating he was having homicidal and suicidal thoughts from withdrawal from the medication which was not logical.  He agreed it was not logical and stated on 05/29/2021 that he felt better  06/23/2021 appointment the following noted: No clonidine. Sunday panic attack eating out and then having SI.  Stayed with father to feel safer. Intrusive thoughts of hurting parents or others.  HI only when has SI.  Usually panic without SI.  Then had intrusive thoughts about hurting others.   Usually intrusive thoughts are brief and fleeting.    07/20/21  appt noted; More anxious with less lithium.  More anxious in his body.  Not more depressed or irritable.  Sleep is the same.  More racing thoughts.  Chronic SI probably a litte worse but not unmanageable. Rduced lithium last week to 300 mg daily. No recent change in lisinopril. Off the clonidine for a week or so. Doesn't drink much water.  Drinks a  lot of diet coke. No marked akathisia or RLS but does shake his leg DT anxiety. Worries about needing mor emedicine. Plan: Increase olanzapine to 25 mg nightly bc more racing thoughts and anxiety with less lithium DT severity of sx and difficulty getting off Seroquel conside rincrease.  07/30/21 TC : CO racing SI today and wonders about whether increased sx racing thoughts, hyperactivity, SI related to Reduced lithium from 600 mg daily to 300 mg daily recently DT lithium level 1.8.  could be related.    Plan: increase olanzapine 30 mg pm Today take lithium 600 mg now.  Tomorrow start lithium CR  450 mg daily Continue olanzapine 30 mg PM until SI resolves unless akathisia returns.  Meredith Staggers, MD, DFAPA  08/17/21 appt noted:  No akathisia with olanzapine 30 mg pm (about 3 weeks) and is sleeping better. Increased lithium to 450 mg daily. Tolerating meds. Still racing negative thoughts maybe some better.  Asked about it. SI come and go.   Anxiety about the same as depression.  No motivation unless has to do something. Plan: Reduce  fluoxetine 20 mg 1 daily in hopes racing thoughts and anxiety are better.  09/14/2021 appointment with the following noted: No difference noted with reduction fluoxetine 20 mg daily. Most of the time negative thoughts, ex "what if I get in a wreck?"  Frequent negative thoughts.   Still on lorazepam 0.5 mg TID.   Wonders about trying prior meds that caused akathisia bc no longer gets it with olanzapine 30 mg daily. Sleep is usually ok.  Taking Seroquel 200 mg HS.  Thinks the olanzapine helps his sleep and doesn't want to stop it. Panic 1-2 times per month.  Then takes extra lorazepam. Plan: DC fluoxetine 20 mg 1 daily  Trial Auvelity off label for depression 1 in the AM for 1 week then 1 in AM and 1 with evening meal  10/15/2021 appointment with the following noted: Occ of getting confused as to day and went to church on a Thursday my mistake.   Had it happen a  few months ago too.  Memory is terrible. Thinks he couldn't tolerate more Auvelity. Not a lot of benefit from B[place]ve 1 daily. Still quite a bit of anxiety.   A little drowsy  and sleeps 7-8 hours at night. Word finding problems.  Lost wallet. Plan: DC  Auvelity  For [facility]: memantine 1/2 tablet in the AM for 1 week, then 1/2 tablet twice daily, then 1/2 tablet in the AM and 1 tablet at night for 1 week then 1 tablet twice daily  10/22/2021 phone call complaining of additional stress asking to start new medication because of anxiety and more suicidal thoughts without intent or plan.  Reports taking more than 4 prescribed Ativan a day recently.  Asking to increase olanzapine which is already at 30 mg daily. MD response:We cannot go any higher in the dosage of olanzapine that he is currently taking.  A lot of these suicidal thoughts are apparently anxiety driven.  Let us have him resume taking paroxetine which is one of the more effective medicines for anxiety.  I will send it in and the instructions will be paroxetine 20 mg tablets 1/2 tablet daily for 1 week then 1 tablet daily  11/16/21 appt noted: Multiple phone calls since she was here.  Complaining of suicidal thoughts from memantine which was stopped. Started paroxetine and up to 20 mg daily.  Continues olanzapine 30 mg, lithium 450 mg nightly, lorazepam 0.5 mg 4 times daily , And quetiapine 200 mg nightly. Questions about whether memantine caused the neg SI really bc has them at times. When has SI often anxiety driven.  Will tend to ruminate on past relationship ended.   Edgy and irritable all the time but memantine seemed to make it worse. Mind is getting better with less SI lately.   No SE with paroxetine.   Aggrivated by forgetfulness.  eXample, talked with someone about going to breakfast last night and then forgot to go this mornin.g. Plan prescribed donepezil 5 mg daily for cognitive complaints specifically forgetfulness  12/31/2021  appointment with the  following noted: He had been prescribed donepezil 5 mg for cognitive complaints.  He called back reporting he felt it was causing homicidal thoughts and was instructed to stop it.  He has had these types of thoughts in the past.  He had no desire to act on them. Discouraged. Ongoing depression and reduced enjoyment and negative thoughts.  Can enjoy some things. A lot of anxiety chronically.  Trying to limit Ativan to 5 of 0.5 mg daily.  Sometimes needs 2. Constipation for years.  02/03/22 appt noted: Depression makes LBP worse. Ongoing chronic depression. Tolerated rivastigmine without triggering SI Chronic anxiety and Ativan helps some. Occ panic but not usual.  Asks if any other meds coud be used. Plan: But for now continue lorazepam 0.5 mg QID, but use LED for cog reasons Increase rivastigmine to 3 mg capsule 1 twice daily or 2 of the 1.5 mg capsules twice daily.  Call if you have problems with nausea Start dextromethorphan 1 capsule twice daily . Thi sis off label for depression based on MOA of Auvelity and use of paroxetine to prolong half life of DM. Presence of paroxetine may have been reason he didn't tolerate Auvelity brief trial before.  Will proceed slowly.  03/17/2022 appointment noted: Current psych meds: Rivastigmine 3 mg twice daily, quetiapine 200 mg nightly, paroxetine 40 mg daily, olanzapine 30 mg nightly, Namenda 10 mg daily, lorazepam 0.5 mg 4 times daily, lithium CR 450 mg nightly, dextromethorphan 15 mg twice daily A lot of anxiety since here.  Some days taken more lorazepam to 6-7 tablets daily.  No major triggers but anxious even about coming here and other normal things.  Cancelled trip to collect rocks with friend and cancelled the trip bc anxious.  Usually if can go he is ok.  Lots of SI ongoing Memory might be a little better. Asks how he responded to [employer].  Will check paper chart.  03/24/22 TC:  CO SI worse and restilless with Rivastigmine 9  mg . MD response:  Stop the rivastigmine since the [place] appears to be having side effects from it.  He was having anxiety at the last appointment 2 and then we increased the dose.  This might be causing the anxiety to be worse. He has chronic suicidal thoughts without intent or plan.  However it appears the thoughts are more intense with the rivastigmine     05/06/21 appt noted: Stopped rivastigmine DT anxiety and worsening SI.  Was better the next day. This week had some SI.  Thinking about the prior relationship tends to lead to SI. Will take Ativan when gets SI and it helps a little at 0.5 mg at a time. Plan: Consider fluvoxamine.  Yes this may help with obsessions on old GF and he failed other optioons. Start fluvoxamine 1/2 of 50 mg tablet and reduce paroxetine to 1 and 1/2 of 20 mg tablets for 5 days, Then increase fluvoxamine to 1 tablet daily and reduce paroxetine to 1 tablet daily for 5 days, Then increase fluvoxamine to 1-1/2 tablets daily and reduce paroxetine to 1/2 tablet daily for 5 days, Then increase fluvoxamine to 2 tablets daily and stop paroxetine   06/07/22 appt noted: Feels like the fluvoxamine 50 BID is too much.  On this a couple of weeks. Worries he might be worse with this with regard to SI.    He thinks it's worse with increase.   New neighbor who has a young boy. He worries that Reginal Lutes is triggering depression  and intrusive SI/HI a couple of days later and lasts a little while. Has had anxiety turning into panic attacks.  This gets associated with depression and SI/HI.  These thoughts are intrusive and he doesn't want to act on them.  Can have HI even towards family with whom he has no negative emotions.  Little things like a bump up in parking lot sets off his depression and anxiety and doesn't seem to handle things.   No sig akathisia.  In the past it's been continuous.   Awakens every 2-3 hours.  Usually goes back to sleep with meds. Has not had alcohol in 20 years  and had some intrusive thoughts without drinking.   Plan: As soon as he feels comfortable increase fluvoxamine to 50 mg AM and 100 mg PM for intrusive thoughts of HI/SI and obs on GF  07/06/22 appt noted: Increased fluvoxamine to 150 mg daily and felt more agitated and some SI so reduced to 100 mg 4 days ago and feels better so far.  Likes the fact it is good for anxiety and obs thoughts.  Wonders if there is a similar med.  Frustrated he can't get relief.  Some improvement in obs on exGF but not resolved. Continue other meds: lithium CR 450 mg HS, lorazepam 0.5 mg QID, Memantine 10 BID, olanzapine 30 mg HS, quetiapine 200 mg HS Tingling and burning in legs at night but doesn't interfere with sleep. Got insurance to pay for [facility] and lost 13#.   No full panic lately. But some anxiety waves.   Still major problems with memory. Plan: Wean fluvoxamine 50 mg daily for a month then 1/2 daily for 2 weeks then stop it DT  NR.  Watch for more anxiety.  07/28/22 TC: Franchot Erichsen, CMA  to Me     07/28/22  4:16 PM Note Mom called reporting patient has been confused, dizzy, and sluggish for the last 3 days. He is now staying with her. She had several questions in regards to this:   Lithium level - she feels he needs one done, last result in Epic was 08/2021 that I see. She would like order sent to [facility] for tomorrow.   Patient is on Wegovy, has been for about a month, and she wonders if this could cause sx.   Patient has meclizine and she is questioning if she should give that for his dizziness considering other sx.   She questions if perhaps he got up during the night and took additional medications, though doesn't know if this happened and if so what he took.    On 3/5 visit he was to wean fluvoxamine to 50 mg qd for a month and then 1/2 tab for 2 weeks and then stop. Mom and pt unsure if he started the wean.    He has F/U 4/4.        Me  to Franchot Erichsen, CMA   CC   07/28/22  5:44  PM Note Yes get lithium level in the AM and don't take any lithium tonight or in the morning before the blood test.     Reginal Lutes should not cause these sx except if he is losing wt quickly or is dehydrated it can cause lithium toxicity and include these sx.  He needs to drink lots of water.   He should go ahead and wean the fluvoxamine as instructed in the last appt.   If the sx get worse, go to the ER.  Me   CC   07/29/22  6:49 PM Note RTC   Sx not as severe as when he had to go to hospital in the past.  [name] got Lithium level today.   Speech is not good .  Confused at times, but better today.  A little tremor .  A little balance problems.  No falls.  No diarrhea Took lihtium 450 last night. Mo says he got up in the middle of the night and moved things around last night and he doesn't rmember.   No change in night time meds.   Gradually reducing fluvoxamine.   No extra lorazepam.     Spoke with mother who verifies the same.   No med changes but push fluids.   Meredith Staggers, MD, DFAPA      08/05/22 appt : with parents Pt hosp 08/02/22 with acute kidney injury and somewhat elevated lithium level with some confusion.  Spoke with psych NP at hosp and lithium was decreased to 450 mg HS. No pending appt known with medical doctors about kidney function. Don't feel good and having HI and SI and feels he needs lithium for these issues.  Doesn't feel he should have been discharged.   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 No added meds at night but has been confused at night and getting up multiple times and doing things he doesn't remember.  This concerns parents.  Parents been up at night with him and he doesn't make sense.  Still groggy in the am per mo for an hour.   Plan: Plan: Get lithium level and kidney test ASAP Stop fluvoxamine Reduce lithium 300 mg capsule 1 at night Monday night: reduce to olanzapine to 20 mg at night (stop the 10 mg tablet) and start clozapine prescription. Monday  4/15 : increase clozapine to 100 mg at night and reduce quetiapine to 1/2 of 200 mg tablet and continue olanzapine 20 mg each evening.  08/01/22 & 08/11/22 ED for HI and SI  08/12/22 TC mother:  Mother, Patsy called. Per note made by Larinda Buttery. He was referred to [facility].Reporting that he did go up there last night. This morning they called her and told her to come pick him up. They told her he wasn't having homicidal or suicidal thoughts and it would be four to five days of him waiting to go to a facility in [place]. They recommended him for PHP to start 4/16. She picked him up today. He is worried about these thoughts coming back up. Also, [facility] came out yesterday to assess him and offered counseling from [facility]. He wants to do counseling with them.  Any input on care he can receive?  He is so discouraged and wants to be safe.  (She notes that the [facility] gave him- Zyprexa 10mg  at 1:07am and again at 1:25am per the med list- based on his previous med list) He was supposed to be off of Zyprexa10mg . Has been on  Clozapine since Monday. He did go ahead and take it before he left ( 2pills) so he hasn't missed a dose and did go get labwork done. Is wanting to continue the Clozapine, please check the labwork to be able to fill the 100mg  dose.    MD resp:  Continue all meds as RX except go ahead and increase clozapine to 100 mg tablets, 1 each night as soon as he can get the 100 mg tablets from pharmacy.  RX sent.  Trying to speed up his chance of  response by getting it to a hgiher dose.  Clozapine is very effective for HI/SI.  If he gets worsening HI/SI in the interim it will not be caused by meds but just the natural waxing and waining of his sx.  Once clozapine dose gets high enough it should help but avg patient needs 300 mg so we have to just be patient.      08/20/22 appt:  Up to 100 mg clozapine HS SE constipation and using Colace, Miralax.  Been to [facility] twice  since here.   Ongoing continuous HI/SI.   All I  want to do is go to sleep to escape.  Dep causing the thoughts.   Taking Ativan 0.5 mg BID-QID.  He thinks mother not giving him enough of it.  He thinks anxiety triggers HI/SI.  He fears HI and hopes he's strong enough to reisist.  Not angry or wanting to hurt anyone in reality but has the intrusive thoughts. Conc and memory still not good.   Sleep not much different.   Staying with parents currently. Dep gotten worse M worrying he's dependent on lorazepam.   Confusion at night is no longer happening.  Not wandering at night.  Plan: For severe constipation start Linzess 145 mg AM Get lithium level and kidney test ASAP Stopped fluvoxamine and confusion at night is gone  lithium 300 mg capsule 1 at night  olanzapine to 20 mg at night  increase clozapine to 150 mg at night for 3 nights then 200 mg HS   olanzapine 20 mg each evening. Continue lorazepam 0.5 mg QID, needs this and informed mother  09/07/22 appt noted: Psych meds: clozapine 250 MG HS, lithium 300 mg HS, lorazepam 0.5 mg BID-QID, olanzapine 20 .  Off Seroquel, linzess 290 mg daily, Miralax, Colace. Sleep 7-8 hours without change from before clozapine.   Mood is still very depressed and still gets anxiety. Still problems with constipation.   Dep is so much it is causing intrusive HI/SI.   No more bizarre behaviors at night.    Psych med hx extensive including ECT and  risperidone, Zyprexa 30 Latuda 80 which caused akathisia,  Vraylar, Rexulti, aripiprazole 20 mg with akathisia,  Seroquel 1000 mg,  InVega, Geodon, Saphris with side effects, symbyax, Fanapt NR .  Caplyta SE and markedly worse. lithium 1200 SE,  lamotrigine 300 mg, Depakote 2000 mg, Tegretol, Trileptal and several of these in combinations, gabapentin,  N-acetylcysteine, Nuedexta,     Belsomra with no response,  Lunesta no response, trazodone 200 mg,  Xanax, clonazepam, lorazepam less sedation. Buspirone NR Clonidine SE with 2 trials  Viibryd 40 mg for 3  months with diarrhea,  protriptyline with side effects,  Trintellix 20 mg,  Parnate 50 mg with no response,   Emsam 12 mg for 2 months,   imipramine, venlafaxine,   bupropion was side effects,  Lexapro 20 mg, sertraline, paroxetine, Deplin, fluoxetine 80,  fluvoxamine 150 agitated.  Confusion when with clozapine. Auvelity NR at one daily.  SE BID  methylphenidate 60 mg,  Vyvanse, Concerta, strattera, , modafinil,  Memantine worsening SI? Donepezil, complained of HI, Exelon 3 BID rivastigmine DT worsening SI  pramipexole,  amantadine ,  Patient prone to akathisia.  [facility]   Review of Systems:  Review of Systems  Respiratory:  Negative for shortness of breath.   Cardiovascular:  Negative for chest pain and palpitations.  Musculoskeletal:  Positive for back pain.  Neurological:  Positive for dizziness and tremors.  Fidgety  Psychiatric/Behavioral:  Positive for confusion, decreased concentration, depression and suicidal ideas. Negative for agitation, behavioral problems, dysphoric mood, hallucinations, self-injury and sleep disturbance. The patient is nervous/anxious. The patient is not hyperactive.     Medications: I have reviewed the patient's current medications.  Current Outpatient Medications  Medication Sig Dispense Refill   acetaminophen (TYLENOL) 500 MG tablet Take 1,000 mg by mouth every 6 (six) hours as needed for moderate pain.     bisacodyl (DULCOLAX) 10 MG suppository Place 1 suppository (10 mg total) rectally as needed for moderate constipation. 12 suppository 0   esomeprazole (NEXIUM) 40 MG capsule Take 40 mg by mouth daily.     fenofibrate (TRICOR) 145 MG tablet Take 145 mg by mouth daily.     fluticasone (FLONASE) 50 MCG/ACT nasal spray Place 1 spray into both nostrils daily as needed for allergies or rhinitis.     hydrocortisone (ANUSOL-HC) 2.5 % rectal cream Place 1 application rectally 3 (three) times daily as needed for  hemorrhoids.     levothyroxine (SYNTHROID, LEVOTHROID) 75 MCG tablet Take 75 mcg by mouth daily before breakfast.     linaclotide (LINZESS) 290 MCG CAPS capsule Take 1 capsule (290 mcg total) by mouth daily before breakfast. 30 capsule 2   lisinopril (ZESTRIL) 5 MG tablet Take 5 mg by mouth daily.     lithium carbonate 300 MG capsule Take 1 capsule (300 mg total) by mouth at bedtime. 30 capsule 1   LORazepam (ATIVAN) 0.5 MG tablet Take 1 tablet (0.5 mg total) by mouth 4 (four) times daily. 120 tablet 1   meclizine (ANTIVERT) 25 MG tablet Take 1 tablet (25 mg total) by mouth 3 (three) times daily as needed for dizziness. 30 tablet 1   OLANZapine (ZYPREXA) 20 MG tablet TAKE ONE TABLET BY MOUTH AT BEDTIME 30 tablet 0   polycarbophil (FIBERCON) 625 MG tablet Take 1,250 mg by mouth daily.     sildenafil (VIAGRA) 100 MG tablet Take 1 tablet (100 mg total) by mouth daily as needed for erectile dysfunction. 30 tablet 6   tamsulosin (FLOMAX) 0.4 MG CAPS capsule Take 1 capsule (0.4 mg total) by mouth daily. 90 capsule 3   WEGOVY 1 MG/0.5ML SOAJ Inject 1 mg into the skin once a week.     cloZAPine (CLOZARIL) 100 MG tablet Take 4 tablets (400 mg total) by mouth at bedtime. 24 tablet 1   No current facility-administered medications for this visit.   Medication Side Effects: Other: mild sleepiness.   Occ twitches.\, tremor  Allergies:  Allergies  Allergen Reactions   Meloxicam Other (See Comments)    Dizziness   Nsaids Other (See Comments)    Hallucinations   Ibuprofen Other (See Comments)    Can not take because taking lithium   Prednisone Other (See Comments)    Can't sleep    Wellbutrin [Bupropion] Other (See Comments)    Suicidal thoughts    Past Medical History:  Diagnosis Date   ADHD (attention deficit hyperactivity disorder)    Anemia    Anxiety    Bipolar disorder (HCC)    BPH (benign prostatic hyperplasia)    Chronic kidney disease, stage 3b (HCC)    Constipation    DDD  (degenerative disc disease), cervical    Depression    Deviated septum    ED (erectile dysfunction)    GERD (gastroesophageal reflux disease)    Graves disease    History of hiatal hernia    Hypertension  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    Hypothyroidism    Pneumonia    PONV (postoperative nausea and vomiting)     Family History  Problem Relation Age of Onset   Prostate cancer Neg Hx    Bladder Cancer Neg Hx    Kidney cancer Neg Hx     Social History   Socioeconomic History   Marital status: Single    Spouse name: Not on file   Number of children: Not on file   Years of education: Not on file   Highest education level: Not on file  Occupational History   Not on file  Tobacco Use   Smoking status: Former    Types: Cigarettes    Passive exposure: Past   Smokeless tobacco: Former    Types: Associate Professor Use: Never used  Substance and Sexual Activity   Alcohol use: Not Currently   Drug use: Never   Sexual activity: Yes  Other Topics Concern   Not on file  Social History Narrative   Not on file   Social Determinants of Health   Financial Resource Strain: Not on file  Food Insecurity: Not on file  Transportation Needs: Not on file  Physical Activity: Not on file  Stress: Not on file  Social Connections: Not on file  Intimate Partner Violence: Not on file    Past Medical History, Surgical history, Social history, and Family history were reviewed and updated as appropriate.   Please see review of systems for further details on the patient's review from today.   Objective:   Physical Exam:  There were no vitals taken for this visit.  Physical Exam Constitutional:      General: He is not in acute distress.    Appearance: He is well-developed.  Musculoskeletal:        General: No deformity.  Neurological:     Mental Status: He is alert and oriented to person, place, and time.     Cranial Nerves: No dysarthria.      Motor: Tremor present.     Coordination: Coordination normal.     Comments: mild tremor  Psychiatric:        Attention and Perception: Attention and perception normal. He does not perceive auditory or visual hallucinations.        Mood and Affect: Mood is anxious and depressed. Affect is not labile, blunt, angry, tearful or inappropriate.        Speech: Speech normal. Speech is not slurred.        Behavior: Behavior normal. Behavior is not slowed. Behavior is cooperative.        Thought Content: Thought content is not delusional. Thought content includes suicidal ideation. Thought content does not include homicidal ideation. Thought content does not include suicidal plan.        Cognition and Memory: Cognition normal. He exhibits impaired recent memory.        Judgment: Judgment normal.     Comments: Insight fair Chronically talkative .directable. But chronically mildly pressured. He is chronically anxious  worse with less lithium No manic signs noted. Some wordfinding problems ongoing. Intrusive thoughts lately trigger SI Fidgety today but not uncomfortable Alert and oriented.      Lab Review:     Component Value Date/Time   NA 133 (L) 08/12/2022 0029   NA 138 08/05/2022 1630   NA 142 03/20/2014 0514   K 3.9 08/12/2022 0029   K 4.1 03/20/2014  0514   CL 103 08/12/2022 0029   CL 112 (H) 03/20/2014 0514   CO2 19 (L) 08/12/2022 0029   CO2 26 03/20/2014 0514   GLUCOSE 101 (H) 08/12/2022 0029   GLUCOSE 99 03/20/2014 0514   BUN 14 08/12/2022 0029   BUN 15 08/05/2022 1630   BUN 7 03/20/2014 0514   CREATININE 1.85 (H) 08/12/2022 0029   CREATININE 0.78 03/20/2014 0514   CALCIUM 9.6 08/12/2022 0029   CALCIUM 9.5 03/20/2014 0514   PROT 6.7 08/12/2022 0029   PROT 7.3 03/18/2014 1459   ALBUMIN 4.2 08/12/2022 0029   ALBUMIN 3.2 (L) 03/18/2014 1459   AST 18 08/12/2022 0029   AST 16 03/18/2014 1459   ALT 14 08/12/2022 0029   ALT 16 03/18/2014 1459   ALKPHOS 24 (L) 08/12/2022  0029   ALKPHOS 105 03/18/2014 1459   BILITOT 0.5 08/12/2022 0029   BILITOT 0.3 03/18/2014 1459   GFRNONAA 42 (L) 08/12/2022 0029   GFRNONAA >60 03/20/2014 0514   GFRAA 54 (L) 04/18/2019 1612   GFRAA >60 03/20/2014 0514       Component Value Date/Time   WBC 4.7 09/01/2022 1124   WBC 6.8 08/12/2022 0029   RBC 4.11 (L) 09/01/2022 1124   RBC 3.72 (L) 08/12/2022 0029   HGB 12.8 (L) 09/01/2022 1124   HCT 38.0 09/01/2022 1124   PLT 495 (H) 09/01/2022 1124   MCV 93 09/01/2022 1124   MCV 89 03/20/2014 0514   MCH 31.1 09/01/2022 1124   MCH 31.5 08/12/2022 0029   MCHC 33.7 09/01/2022 1124   MCHC 33.4 08/12/2022 0029   RDW 11.7 09/01/2022 1124   RDW 12.5 03/20/2014 0514   LYMPHSABS 1.2 09/01/2022 1124   LYMPHSABS 3.2 03/20/2014 0514   MONOABS 0.5 08/12/2022 0029   MONOABS 1.0 03/20/2014 0514   EOSABS 0.5 (H) 09/01/2022 1124   EOSABS 0.4 03/20/2014 0514   BASOSABS 0.1 09/01/2022 1124   BASOSABS 0.1 03/20/2014 0514    Lithium Lvl  Date Value Ref Range Status  08/05/2022 1.1 0.5 - 1.2 mmol/L Final    Comment:    A concentration of 0.5-0.8 mmol/L is advised for long-term use; concentrations of up to 1.2 mmol/L may be necessary during acute treatment.                                  Detection Limit = 0.1                           <0.1 indicates None Detected    08/12/21 lithium level 0.9 on 450 mg.  lithium level Sept 0.8.   lithium level July 27, 2018 was normal at 1.0.   Lithium level [facility] October 03, 2018 = 1.2. Said he got lithium level at [facility] as requested.  Labs not in Epic.  Recent lipids ok except higher TG than usual.  Normal A1C.  No results found for: "PHENYTOIN", "PHENOBARB", "VALPROATE", "CBMZ"   .res Assessment: Plan:    Katharine was seen today for follow-up, depression, anxiety and medication problem.  Diagnoses and all orders for this visit:  Severe bipolar I disorder, current or most recent episode depressed (HCC) -     cloZAPine (CLOZARIL) 100 MG  tablet; Take 4 tablets (400 mg total) by mouth at bedtime.  GAD (generalized anxiety disorder)  Drug-induced constipation  Long term current use of clozapine  Panic disorder  with agoraphobia  Insomnia due to mental condition  Mild cognitive impairment   50 min appt with pt alone and then with parents: Chronic TR bipolar mixed and chronic anxiety.  He usually has mixed bipolar symptoms which we have not been able to completely eliminate.  See long list of meds tried.   He is somewhat chronically unstable but better on olanzapine worse if reduces quetiapine quickly.  Recent hosp 08/02/22 with SI and acute kidney injury and elevated lithium. Processed this and disc goal of trying to stop lithium but risk of worsening SI.  He has chronic SI and there's risk of this worsening off lithium.  Lithium and clozapine are the most effective meds for SI.  Possible that [facility] made him dehydrated and constipation and caused incr lithium level.  Had 15-20 # wt loss.  Discussed mother's concerns about his memory which is primarily a short-term memory issue.  We discussed the alternative options of using Namenda off label for mild cognitive impairment.  We are not yet able to reduce the dose of lorazepam but that could potentially help as well.  His memory was much worse on Xanax than lorazepam. We discussed the short-term risks associated with benzodiazepines including sedation and increased fall risk among others.  Discussed long-term side effect risk including dependence, potential withdrawal symptoms, and the potential eventual dose-related risk of dementia. Disc newer studies refute dementia risks.  Unfortunately due to the severity of set his symptoms polypharmacy is a necessity.  Lithium being used bc chronic SI and death thoughts.   Counseled patient regarding potential benefits, risks, and side effects of lithium to include potential risk of lithium affecting thyroid and renal function.  Discussed  need for periodic lab monitoring to determine drug level and to assess for potential adverse effects.  Counseled patient regarding signs and symptoms of lithium toxicity and advised that they notify office immediately or seek urgent medical attention if experiencing these signs and symptoms.  Patient advised to contact office with any questions or concerns. reduce lithium  300 mg daily.  Will plan to stop if clozapine starts working. Check lithium level and BMP Lithium level 0.7 on 3/115/23 on 300 mg daily Call if death thoughts worsen or worsening SI.   Disc SE. Disc combo with Seroquel may lower response rate but he doesn't think he can sleep without Seroquel.  Reduced olanzapine to 20 mg nightly  for more racing thoughts and anxiety with less lithium DT severity of sx and difficulty getting off Seroquel conside rincrease.  Disc in detail again clozapine.  Disc data showing less SI with it.  Disc risk in detail including low WBC with complication, myocarditis.  Extensive discussion of CBC monitoring.   Disc this again bc taking Wegovy might help him to tolerate it better.  He's started [place]  Did he take duloxetine?  Might help depression more.  Consider retry pramipexole off label   But for now continue lorazepam 0.5 mg QID prn,  but use LED for cog reasons  For [facility] had to stop rivastigmine DT worsening SI  CBT dealing with obsession around old GF discussed  We discussed the short-term risks associated with benzodiazepines including sedation and increased fall risk among others.  Discussed long-term side effect risk including dependence, potential withdrawal symptoms, and the potential eventual dose-related risk of dementia.  But recent studies from 2020 dispute this association between benzodiazepines and dementia risk. Newer studies in 2020 do not support an association with dementia.  Discussed safety  plan at length with patient.  Advised patient to contact office with any worsening  signs and symptoms.  Instructed patient to go to the [facility] emergency room for evaluation if experiencing any acute safety concerns, to include suicidal intent.  He commits to safety.  Disc Ozempic and Monjauro for weight loss.  Has had weight gain from meds as a contributor. But lately has lost some weight.  Disc Dr. Pollie Meyer mentioned evidence it could help depression.  I rec he continue this also.  Unlikely to worsen SI.  He's making progress.  Needs to keep up activity as much as possible for depression.  Take Miralax daily for constipation.  Constipation management 1.  Lots of water 2.  Powdered fiber supplement such as MiraLAX, Citrucel, etc. preferably with a meal 3.  2 stool softeners a day 4.  Milk of magnesia or magnesium tablets if needed For severe constipation start Linzess 290 mg AM for another week and if fails consider lactulose  Had constipation before clozapine and has pending GI appt in June.  Plan: Get lithium level and kidney test ASAP  lithium 300 mg capsule 1 at night  olanzapine to 20 mg at night  increase clozapine to  300 mg HS as soon as tolerated and then 400 mg HS.  olanzapine 20 mg each evening. Continue lorazepam 0.5 mg QID, needs this and informed mother  This appt was 90 mins.  FU 4 weeks   Meredith Staggers, MD, DFAPA  Future Appointments  Date Time Provider Department Center  10/06/2022  1:00 PM Cottle, Steva Ready., MD [facility] None  11/08/2022  1:30 PM Cottle, Steva Ready., MD [facility] None  12/30/2022  2:30 PM Sondra Come, MD [facility] None    No orders of the defined types were placed in this encounter.      -------------------------------

## 2022-09-08 ENCOUNTER — Other Ambulatory Visit: Payer: Self-pay | Admitting: Psychiatry

## 2022-09-09 LAB — CBC WITH DIFFERENTIAL/PLATELET
Basophils Absolute: 0.1 10*3/uL (ref 0.0–0.2)
Basos: 2 %
EOS (ABSOLUTE): 0.7 10*3/uL — ABNORMAL HIGH (ref 0.0–0.4)
Eos: 15 %
Hematocrit: 38.3 % (ref 37.5–51.0)
Hemoglobin: 13.4 g/dL (ref 13.0–17.7)
Immature Grans (Abs): 0 10*3/uL (ref 0.0–0.1)
Immature Granulocytes: 0 %
Lymphocytes Absolute: 1.1 10*3/uL (ref 0.7–3.1)
Lymphs: 24 %
MCH: 31.6 pg (ref 26.6–33.0)
MCHC: 35 g/dL (ref 31.5–35.7)
MCV: 90 fL (ref 79–97)
Monocytes Absolute: 0.4 10*3/uL (ref 0.1–0.9)
Monocytes: 8 %
Neutrophils Absolute: 2.4 10*3/uL (ref 1.4–7.0)
Neutrophils: 51 %
Platelets: 505 10*3/uL — ABNORMAL HIGH (ref 150–450)
RBC: 4.24 x10E6/uL (ref 4.14–5.80)
RDW: 11.9 % (ref 11.6–15.4)
WBC: 4.7 10*3/uL (ref 3.4–10.8)

## 2022-09-14 ENCOUNTER — Telehealth: Payer: Self-pay | Admitting: Psychiatry

## 2022-09-14 ENCOUNTER — Other Ambulatory Visit: Payer: Self-pay | Admitting: Psychiatry

## 2022-09-14 MED ORDER — LACTULOSE 10 GM/15ML PO SOLN: 15.0000 g | Freq: Two times a day (BID) | ORAL | 0 refills | Status: DC

## 2022-09-14 NOTE — Telephone Encounter (Signed)
Patient called with c/o of constipation, said he had not had a BM in 2 weeks. He is taking Linzess, Miralax, Metamucil, and trying to increase water. He is not taking Milk of Magnesia. Last note mentioned trying lactulose if Linzess did not work. He is currently taking 300 mg of clozapine until constipation resolves.   He is reporting "fleeting" suicidal/homicidal thoughts. He is staying with his parents. Has no plan, doesn't want to do anything. He reports thoughts likely due to anxiety.

## 2022-09-14 NOTE — Telephone Encounter (Signed)
Pt called 10:15. He is still constipated for last two weeks. Taking all the things you recommended and the Linzess. Should he go up on the dose of Clozapine and continue the Linzess.  Still struggling some with suicidal thoughts.

## 2022-09-14 NOTE — Telephone Encounter (Signed)
Use one of his enemas or suppositories today then stop Linzess DT no repsonse and start Lactulose is 10 g/15 ml. Sig 15-30 ml per day prn. Can go up to 60 cc per usually not needed. Might take 1-2 days to work.  I sent in RX  Don't increase clozapine until constipation is better managed.

## 2022-09-15 ENCOUNTER — Other Ambulatory Visit: Payer: Self-pay | Admitting: Psychiatry

## 2022-09-15 DIAGNOSIS — F4001 Agoraphobia with panic disorder: Secondary | ICD-10-CM

## 2022-09-15 DIAGNOSIS — F314 Bipolar disorder, current episode depressed, severe, without psychotic features: Secondary | ICD-10-CM

## 2022-09-15 DIAGNOSIS — F411 Generalized anxiety disorder: Secondary | ICD-10-CM

## 2022-09-15 NOTE — Telephone Encounter (Signed)
Rtc to pt and he wanted me to call back and leave him a voicemail with instructions because he was out and unable to write anything down. Pt also asked about SI and after discussing with Dr. Jennelle Human he said that once the constipation is under control the Clozapine can be increased for his SI. I also left that message along with the instructions for the Lactulose. Instructed him to call back with further questions or concerns.

## 2022-09-16 LAB — CBC WITH DIFFERENTIAL/PLATELET
Basophils Absolute: 0.1 10*3/uL (ref 0.0–0.2)
Basos: 1 %
EOS (ABSOLUTE): 0.6 10*3/uL — ABNORMAL HIGH (ref 0.0–0.4)
Eos: 9 %
Hematocrit: 41.6 % (ref 37.5–51.0)
Hemoglobin: 14.1 g/dL (ref 13.0–17.7)
Immature Grans (Abs): 0 10*3/uL (ref 0.0–0.1)
Immature Granulocytes: 0 %
Lymphocytes Absolute: 1.5 10*3/uL (ref 0.7–3.1)
Lymphs: 23 %
MCH: 31.6 pg (ref 26.6–33.0)
MCHC: 33.9 g/dL (ref 31.5–35.7)
MCV: 93 fL (ref 79–97)
Monocytes Absolute: 0.5 10*3/uL (ref 0.1–0.9)
Monocytes: 7 %
Neutrophils Absolute: 3.9 10*3/uL (ref 1.4–7.0)
Neutrophils: 60 %
Platelets: 488 10*3/uL — ABNORMAL HIGH (ref 150–450)
RBC: 4.46 x10E6/uL (ref 4.14–5.80)
RDW: 11.9 % (ref 11.6–15.4)
WBC: 6.5 10*3/uL (ref 3.4–10.8)

## 2022-09-16 NOTE — Telephone Encounter (Signed)
Pt, Albert [name], is calling to see about his RF for Zyprexa to [facility].

## 2022-09-22 ENCOUNTER — Other Ambulatory Visit: Payer: Self-pay | Admitting: Psychiatry

## 2022-09-22 ENCOUNTER — Telehealth: Payer: Self-pay | Admitting: Psychiatry

## 2022-09-22 DIAGNOSIS — F314 Bipolar disorder, current episode depressed, severe, without psychotic features: Secondary | ICD-10-CM

## 2022-09-22 NOTE — Telephone Encounter (Signed)
Pt left message asking Traci to return call @ [phone]. Still having issues not able to use the bathroom and mental issues, he said.

## 2022-09-22 NOTE — Telephone Encounter (Signed)
Since he's seen GI doc they should manage the constipation now.  If he has problems call them.  However don't increase the clozapine until consitpation is better.

## 2022-09-22 NOTE — Telephone Encounter (Signed)
Rtc to pt and he reports the Lactulose didn't do anything. He did go to his GI doctor, she had him discontinue Lactulose and put him on Amitiza and they are going to schedule him for a colonoscopy but its not soon. He reports its been 3 weeks now. He also reports he is still having his same thoughts but he does feel like they are slightly improved. Informed him I would update Dr. Jennelle Human and give him a call back tomorrow.   I told him to continue 3 of the 100 mg Clozapine even though you sent take 4. Is that correct?

## 2022-09-23 NOTE — Telephone Encounter (Signed)
Pt notified and will call back with an update as needed

## 2022-09-24 LAB — CBC WITH DIFFERENTIAL/PLATELET
Basophils Absolute: 0.1 10*3/uL (ref 0.0–0.2)
Basos: 1 %
EOS (ABSOLUTE): 0.6 10*3/uL — ABNORMAL HIGH (ref 0.0–0.4)
Eos: 8 %
Hematocrit: 39.4 % (ref 37.5–51.0)
Hemoglobin: 14.6 g/dL (ref 13.0–17.7)
Immature Grans (Abs): 0 10*3/uL (ref 0.0–0.1)
Immature Granulocytes: 0 %
Lymphocytes Absolute: 1.9 10*3/uL (ref 0.7–3.1)
Lymphs: 27 %
MCH: 33.2 pg — ABNORMAL HIGH (ref 26.6–33.0)
MCHC: 37.1 g/dL — ABNORMAL HIGH (ref 31.5–35.7)
MCV: 90 fL (ref 79–97)
Monocytes Absolute: 0.5 10*3/uL (ref 0.1–0.9)
Monocytes: 7 %
Neutrophils Absolute: 4.1 10*3/uL (ref 1.4–7.0)
Neutrophils: 57 %
Platelets: 552 10*3/uL — ABNORMAL HIGH (ref 150–450)
RBC: 4.4 x10E6/uL (ref 4.14–5.80)
RDW: 11.6 % (ref 11.6–15.4)
WBC: 7.3 10*3/uL (ref 3.4–10.8)

## 2022-09-28 ENCOUNTER — Other Ambulatory Visit: Payer: Self-pay | Admitting: Psychiatry

## 2022-09-28 DIAGNOSIS — Z79899 Other long term (current) drug therapy: Secondary | ICD-10-CM

## 2022-09-28 DIAGNOSIS — F314 Bipolar disorder, current episode depressed, severe, without psychotic features: Secondary | ICD-10-CM

## 2022-09-29 ENCOUNTER — Other Ambulatory Visit: Payer: Self-pay | Admitting: Psychiatry

## 2022-09-29 ENCOUNTER — Telehealth: Payer: Self-pay | Admitting: Psychiatry

## 2022-09-29 NOTE — Telephone Encounter (Signed)
Pt left message ask Traci to RTC. New med and labs back today. Pt # [phone]

## 2022-09-30 ENCOUNTER — Other Ambulatory Visit: Payer: Self-pay | Admitting: Psychiatry

## 2022-09-30 DIAGNOSIS — F314 Bipolar disorder, current episode depressed, severe, without psychotic features: Secondary | ICD-10-CM

## 2022-09-30 LAB — CBC WITH DIFFERENTIAL/PLATELET
Basophils Absolute: 0.1 10*3/uL (ref 0.0–0.2)
Basos: 1 %
EOS (ABSOLUTE): 0.3 10*3/uL (ref 0.0–0.4)
Eos: 4 %
Hematocrit: 37.3 % — ABNORMAL LOW (ref 37.5–51.0)
Hemoglobin: 12.7 g/dL — ABNORMAL LOW (ref 13.0–17.7)
Immature Grans (Abs): 0 10*3/uL (ref 0.0–0.1)
Immature Granulocytes: 0 %
Lymphocytes Absolute: 1.3 10*3/uL (ref 0.7–3.1)
Lymphs: 20 %
MCH: 31 pg (ref 26.6–33.0)
MCHC: 34 g/dL (ref 31.5–35.7)
MCV: 91 fL (ref 79–97)
Monocytes Absolute: 0.7 10*3/uL (ref 0.1–0.9)
Monocytes: 11 %
Neutrophils Absolute: 4.2 10*3/uL (ref 1.4–7.0)
Neutrophils: 64 %
Platelets: 442 10*3/uL (ref 150–450)
RBC: 4.1 x10E6/uL — ABNORMAL LOW (ref 4.14–5.80)
RDW: 11.2 % — ABNORMAL LOW (ref 11.6–15.4)
WBC: 6.6 10*3/uL (ref 3.4–10.8)

## 2022-09-30 MED ORDER — CLOZAPINE 200 MG PO TABS
400.0000 mg | ORAL_TABLET | Freq: Every day | ORAL | 1 refills | Status: DC
Start: 2022-09-30 — End: 2022-10-29

## 2022-09-30 MED ORDER — CLOZAPINE 100 MG PO TABS: 400.0000 mg | ORAL_TABLET | Freq: Every day | ORAL | 1 refills | Status: DC

## 2022-09-30 NOTE — Telephone Encounter (Signed)
Yes increse clozapine to 4 of the 100 mg tablets nightly.  Updated RX sent to [facility]

## 2022-09-30 NOTE — Telephone Encounter (Signed)
He can fill current RX but I went ahead and rewrote it for 200 mg tab, 2 at night but it looked like it still needed PA for this tablet as well.

## 2022-09-30 NOTE — Telephone Encounter (Signed)
Pt's CBC has been documented in [facility].   Pt reports the new medication his GI put him on has been effective he has been able to get relief and has been going daily. Pt asking if he should increase Clozapine to 400 mg daily? Informed him I would check with Dr. Jennelle Human and get back with him. He asked [facility].

## 2022-09-30 NOTE — Telephone Encounter (Signed)
Pt is notified of dose increase. He asked if a higher strength could be sent next time, he reports its hard to take so many of the tablets. Informed him I would let Dr. Jennelle Human know.

## 2022-10-01 NOTE — Telephone Encounter (Signed)
When I submitted a PA yesterday it said it was available to pt without PA. I am sure he will contact us if unable to get.

## 2022-10-06 ENCOUNTER — Ambulatory Visit: Payer: Medicare PPO | Admitting: Psychiatry

## 2022-10-06 ENCOUNTER — Other Ambulatory Visit: Payer: Self-pay | Admitting: Psychiatry

## 2022-10-07 ENCOUNTER — Ambulatory Visit (INDEPENDENT_AMBULATORY_CARE_PROVIDER_SITE_OTHER): Payer: Medicare PPO | Admitting: Psychiatry

## 2022-10-07 ENCOUNTER — Encounter: Payer: Self-pay | Admitting: Psychiatry

## 2022-10-07 DIAGNOSIS — F4001 Agoraphobia with panic disorder: Secondary | ICD-10-CM

## 2022-10-07 DIAGNOSIS — Z79899 Other long term (current) drug therapy: Secondary | ICD-10-CM

## 2022-10-07 DIAGNOSIS — K5903 Drug induced constipation: Secondary | ICD-10-CM

## 2022-10-07 DIAGNOSIS — G3184 Mild cognitive impairment, so stated: Secondary | ICD-10-CM

## 2022-10-07 DIAGNOSIS — F411 Generalized anxiety disorder: Secondary | ICD-10-CM

## 2022-10-07 DIAGNOSIS — F314 Bipolar disorder, current episode depressed, severe, without psychotic features: Secondary | ICD-10-CM

## 2022-10-07 DIAGNOSIS — F5105 Insomnia due to other mental disorder: Secondary | ICD-10-CM

## 2022-10-07 LAB — CBC WITH DIFFERENTIAL/PLATELET
Basophils Absolute: 0.1 10*3/uL (ref 0.0–0.2)
Basos: 1 %
EOS (ABSOLUTE): 0.2 10*3/uL (ref 0.0–0.4)
Eos: 2 %
Hematocrit: 35.5 % — ABNORMAL LOW (ref 37.5–51.0)
Hemoglobin: 12 g/dL — ABNORMAL LOW (ref 13.0–17.7)
Immature Grans (Abs): 0.1 10*3/uL (ref 0.0–0.1)
Immature Granulocytes: 1 %
Lymphocytes Absolute: 1.6 10*3/uL (ref 0.7–3.1)
Lymphs: 16 %
MCH: 31.3 pg (ref 26.6–33.0)
MCHC: 33.8 g/dL (ref 31.5–35.7)
MCV: 92 fL (ref 79–97)
Monocytes Absolute: 0.6 10*3/uL (ref 0.1–0.9)
Monocytes: 6 %
Neutrophils Absolute: 7.6 10*3/uL — ABNORMAL HIGH (ref 1.4–7.0)
Neutrophils: 74 %
Platelets: 464 10*3/uL — ABNORMAL HIGH (ref 150–450)
RBC: 3.84 x10E6/uL — ABNORMAL LOW (ref 4.14–5.80)
RDW: 11.7 % (ref 11.6–15.4)
WBC: 10.1 10*3/uL (ref 3.4–10.8)

## 2022-10-07 MED ORDER — OLANZAPINE 10 MG PO TABS
10.0000 mg | ORAL_TABLET | Freq: Every day | ORAL | 0 refills | Status: DC
Start: 2022-10-07 — End: 2022-10-25

## 2022-10-07 MED ORDER — LITHIUM CARBONATE 150 MG PO CAPS: 150.0000 mg | ORAL_CAPSULE | Freq: Every day | ORAL | 0 refills | Status: DC

## 2022-10-07 NOTE — Patient Instructions (Signed)
Reduce lithium to 150 mg capsule 1 at night Reduce  olanzapine to 10 mg at night  Continue clozapine 400 mg HS bc helping SI and HI and  Continue lorazepam 0.5 mg QID, needs this and informed mother

## 2022-10-07 NOTE — Progress Notes (Signed)
Albert [name] [id number] 12/04/1965 57 y.o.    Subjective:   Patient ID:  Albert [name] is a 57 y.o. (DOB 07-12-1965) male.  Chief Complaint:  Chief Complaint  Patient presents with   Follow-up   Depression   Anxiety   Medication Reaction    Depression        Associated symptoms include decreased concentration, fatigue, appetite change and suicidal ideas.  Past medical history includes anxiety.   Anxiety Symptoms include confusion, decreased concentration, dizziness, nervous/anxious behavior and suicidal ideas. Patient reports no chest pain, palpitations or shortness of breath.      Albert [name] presents to the office today for follow-up of mixed bipolar, anxiety and still grieving stress of breakup.  He requires frequent follow-up because of long-term symptoms.  At visit October 25, 2018.  We made several medicine changes because of his concerns about sleep and other issues.  We increased olanzapine back to 7.5 mg bc not sleeping as well at 5 mg.  He has to have the prescription for quetiapine written for up to 3 tablets at night in case insomnia is worse because insomnia makes his mood disorder so much worse.  We discussed that was above the usual max but the request was granted given his treatment resistant status. We also reduce lithium from 900 mg daily to 750 mg daily to try to reduce tremor and muscle twitches.  At  visit March 28, 2019.  Fluoxetine was increased to 40 mg daily.   Early January 2021 increased to 60 mg daily.  No SE.  seen June 28, 2019.  No further meds were changed except olanzapine was increased back to 10 mg daily to see if if he could get additional benefit per his request.  Covid vaccinated.  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  Then had cholecystectomy.  April 2021 appt with the following noted: 2 episodes night sweats lately.  Memory has been very bad lately.  Repeatedly asks mother questions. Still  tend to stay in his room and his bed.  Still has rapid  cycling mood swings.  Maybe some better with increase in olanzapine to 10 and tolerating itl.  Would like to get out more but can't DT Covid.  Can perform necessary chores.  Will get out of the house when he can.  No change in death thoughts and anxiety in intensity but is better with frequency.  Usually comes and goes in waves but more persistent.  Consistent with meds.  Ativan not helping anxiety very dramatically but he's not sure.  Fidgety.  No trigger other than still grieving relationship and can't get it out of his head.  Poor energy, concentration and more forgetful. In bed more and less active.  Sleep ok lately which is unusual.  Can concentrate on financial matters and stock market at times.  Tolerating meds.  Still some intrusive SI without reason. Less frequent obsessive thoughts about broken relationship and has been doing this for months.  Can't let it go.  Loop.   No unusual stress even with the family who is supportive. Likes the benefit that Zyprexa gives. No sleepwalking nor falling nor odd behavior.  No history of sleep walking.  He understands this may recur with an increase.  Also wants option to rarely take extra quetiapine 300 for sleep prn. Plan:  Try to reduce lorazepam if possible.  10/22/2019 appointment with the following noted: Continues fluoxetine 60, lithium 600 mg, lorazepam 2 mg AM and HS  and 1 mg midday, olanzapine 10, quetiapine 600 mg HS. Still anxious chronically and including driving in crowded spaces.  Doesn't thing Ativan helps as well as Xanax but less cognitive problems. Sleep is not as good.  Occ EFA.  More EMA and wanting to do things in the middle of the night but this is not typical.  Wonders about why that happens.  Some napping.   Tolerating meds.  Asks about weight loss meds.  Disc this in detail.   Plan no changes except OK meclizine prn vertigo.  02/11/20 appt with the following noted: Able to gradually reduce lorazepam to 1 mg AM and HS. More  depressed over time and less interested in things and less interest in going out but does with his parents. Has reduced from 800 to 600 mg HS with some awakening but usually able to go back to sleep.Albert [name]  Spending a good amount of time in bed bc watches TV in bedroom.  Parents watch TV in different part of the house.  No mood swings he notices.   Concerns about weight gain about 200#. Likes olanzapine's benefit for sleep. Plan: Trial for TRD to  Increase fluoxetine to 80 mg daily  to use the combo with olanzapine for TR bipolar depression.   04/21/20 appt with following noted: I thought in beginning some benefit with fluoxetine.  Lifelong negative thinking continues.   Not much bipolar.  Reads a lot on bipolar.   Still tired and anxious a little more may be seasonal.  No familial stressors.  Tends to lay down in afternoon.  Anxiety not over anything in particular.   No SE with fluoxetine. Took meclizine prn.  Easily motion sick.    Asks questions about newer drugs for bipolar like Caplyta. Plan:  No med changes  06/30/20 appt noted: Tired of wearing masks.  Vaccinated.  Asked questions about when this will end with mask mandate.  Mood up and down some since here and getting up 2-3 times.  Disc awakening problems.  Trying to do better bc night eating some.   Mood is about average today so far.  Albert [name] out with friends for breakfast.  Recognizes activity helps mood.   3 days in a row napped a lot in the afternoon.   Bed is a comfort place.  Gets bored and hard to motivate.  Tries to stay away from night eating and spending.   More depressed when alone and wonders about med changes. Plan: Failed response to olanzapine + fluoxetine for TR bipolar depression.   Reduce fluoxetine to 1 daily and reduce olanzapine to one half nightly for 10 days and then stop it. Then start Caplyta 1 daily  08/27/2020 appointment with the following noted: Patient decompensated with the above switch to Caplyta with  intrusive suicidal thoughts and had to be hospitalized for psychiatric reasons.  Multiple phone calls with family members since that time.  Spoke with the psychiatric nurse practitioner with the decision to restart olanzapine in place of the Caplyta given the patient was more stable while on the combination of Seroquel and olanzapine than with the Caplyta. Caplyta triggered HI/SI and depressed and confused on it, and agitated and still has some of it now. No akathisia. Frustrated with lack of therapy at the hospital.   Hydroxyzine didn't help jitteriness.  Feels some better.  Fleeting SI & HI but not obsessive like it was prior to hospitalization.  Feels a little amped up and nervous.  Scared of what could  happen.  Apprehensive being here talking about things. 5-6 hours sleep last night and would like to have more. At mom and dad's house right now and up more in the day. Inadvertently stopped lorazepam abruptly by mistake likely contributing to shakiness. Still some memory issues.  Trying to stay out of bed when watching TV.  Some awakening but managing. Occ fleeting SI and distracts himself.   Always spends a lot of time just laying around.   Can have anxiety for no reason like coming here, and varies in intensity without pattern.      Less panic than in the past.  Some chronic depression and hyperactivity and loudness and hyperverbal.  Usually back to sleep.  Total 6 hours but some napping, awakens 2-4 times nightly but back to sleep..  Taking quetiapine 600 mg HS.  still lacks interest and motivation.  Can follow a TV show if interested. Plan: Restart lorazepam 1 mg in the morning, 1 mg in the afternoon and 1 mg at night for anxiety Stop hydroxyzine  Increase olanzapine to 1 and 1/2 of 10 mg tablets in evening  09/05/2020 appt noted: seen with parents today at his request Made med changes noted and tolerated the changes Better than I did last week. Now only fleeting HI/SI and "no where near what it  was".  Sleeping better.  Still not a lot of energy.  Anhedonia.  Less agitation.  A lot of anxiety all the time but it's helping.  Would like more energy and motivation.   Doesn't handle stress well. Parents note he's less shakey.  He's still staying with his parents.  Only driven once since this happened and F wants him to be able to drive and function more independently.  M agrees he's better.  Memory is better but not normal.  Primarily STM problems No akathisia with olanzapine this time.    Administering his own meds but mo watched. Slept 8 hours last night. Plan consider further reductions in quetiapine  10/17/20 appt noted: Stayed on Seroquel 600 HS and olanzapine 15. Olanzapine seemed to do most for the sleep.  Also helping eliminated HI/SI for the most part.  Wants to try to increase it.  Depression is much better with less rumination also.  Fears akathisia at higher dose as in the past. Also on fluoxetine 40.   Plan: Reduce Seroquel to 1 and 1/2 tablets at night Increase olanzapine to 1 of the 15 mg tablet and 1/2 of the 5 mg tablet for 1 week,  Then, if tolerated increase the olanzapine to 20 mg or 1 of the 15 mg tablets and 1 of the 5 mg tablets. If possible then also reduce quetiapine to 1 tablet at night.  11/05/2020 appointment with the following noted: Up to olanzapine 20 mg hs for 3 days.  Reduced Serooquel to 450 mg HS.  Didn't try to go lower. Had hiatal hernia and umbilical hernia surgery recently with no problem.   No akathisia so far. Sleep ok with meds so far changes.  No mood changes yet but overall likes the smoothness of olanzapine and helping sleep without knociing him out. Still some occ HI/SI, he thinks bc of anxiety generally.  Easily anxious. Plan: Continue olanzapine 20 mg nightly longer to give it time to help mood sx reduce quetiapine to 1 tablet at night to minimize polypharmacy and reduce risk of akathisia.  01/07/21 appt noted: Able to reduce quetiapine 300 mg  HS ok with decent sleep but still  wakes a lot.  No AM hangover.  Don't have a lot to do.  Can enjoy going out and doing things.  But doesn't do it longterm.  Family is doing good.  Father started year 60 at his job.   Tolerating the meds well.  May wake more with quetiapine reduction. Lost 10#. Wonders if could try other meds he didn't tolerate before bc now can tolerate olanzapine and couldn't tolerate it in the past DT akathisia. Mood never great but can swing into depresssion without reason.   Plan: Continue olanzapine 20 mg nightly longer to give it time to help mood sx reduce quetiapine to 1/2 of the 300 mg  tablet at night to minimize polypharmacy and reduce risk of akathisia.  02/02/2021 appointment with the following noted: Tried reduced quetiapine to 150 mg and after a week had withdrawal sx including jittery and SI and he increased to 250 mg daily.  Felt better after increasing to 300 mg daily and last week reduced to 250 mg daily.  After increase quetiapine felt better in a few days.  Seems more alert with less quetiapine but can't tolerate a quick withdrawal.   Some awakening.  Plan: Reduce quetiapine by 50 mg every 2-4 weeks.  03/09/2021 phone call: Pt called and said that he is weaning off the seroquel. He was down to 200 mg of the seoquel and he started having withdrawal symptoms and sucidal thoughts. He went back up to 250 mg because he knew he was good on that.    Pt stated he starting back taking 250 mg on Saturday due to the symptoms he was having.Now he is no longer having suicidal thoughts but still very jittery. MD: He may just need to taper slower than most people.  If he can tolerate the 250 mg Seroquel with the jitteriness it should get better over the next 1 to 2 weeks.  Then he can try going down again by 50 mg at a time.  He probably needs to go down by just 50 mg every 2 to 4 weeks and wait till he fully adjusts to each reduction before he reduces again.  03/17/21 appt  noted: Hernia surgery 2 weeks ago. Seroquel 250 mg for a month then 200 mg daily and had SI and increased again to 300 mg daily.  Would love to get off it.  Withdrawal triggers intrusive HI/SI but otherwise doesn't have them No SE. Sleep is OK but not enough REM sleep.  Satisfied with other meds.   No current HI/SI.  No paranoia.  Still has anxiety and taking Ativan every 6 hours.  It works but doesn't cure it. Chronic anxiety and ask about ketamine for chronic depression. Plan: Because he had WD reducing from 250 to 200 mg daily will have to go slowly. Reduce quetiapine by 25 mg every 2-4 weeks. Reduce Seroquel to 250 mg for 2 weeks, Then reduce to 225 mg for 2 weeks, Then reduce to 200 mg for 2 weeks, Then reduce to 175 mg for 2 weeks, Then reduce to 150 mg Also: Buspirone 30 mg tablets for anxiety Start 1/3 tablet twice daily for 1 week Then increase to two thirds twice daily for 1 week Then increase to 1 tablet twice daily  04/15/2021 appointment with the following noted: Reduced Seroquel to 200 mg HS and increased buspirone to 30 mg BID STM px and wants to try to reduce Ativan to see if it is better.  But fears with drawal sx. Parents  complain about his memory. Less HI and SI since here. Less depressed. Plan: Buspirone 30 mg tablets for anxiety Start 1/3 tablet twice daily for 1 week Then increase to two thirds twice daily for 1 week Then increase to 1 tablet twice daily Continue olanzapine 20 mg nightly longer to give it time to help mood sx Because he had WD reducing from 250 to 200 mg daily will have to go slowly. Reduce quetiapine by 25 mg every 2-4 weeks. Reduce Seroquel to 250 mg for 2 weeks, Then reduce to 225 mg for 2 weeks, Then reduce to 200 mg for 2 weeks, Then reduce to 175 mg for 2 weeks, Then reduce to 150 mg Trial taper lorazepam: In hopes of improving short-term memory  04/30/2022 phone call: Complaining of withdrawal symptoms reducing lorazepam from 0.5  mg 5 times a day to 0.5 mg 4 times daily.  05/21/2021 appointment with the following noted: Gotten onto buspar 30 BID and down on lorazepam to 0.5 mg QID. Gotten down to Seroquel 200 mg HS A little restless every now and then but able to reduce lorazepam for the last weeks. Is too inactive and lays in bed too much watching TV.   Can' t tell effect of buspirone bc of other changes.  Sleep with awakening with 6-7 hours. Fleeting SI without trigger or plan or intent. Depressed more than anxious. Enjoys watching stocks and investing. Plan: Trial taper lorazepam gradually over time In hopes of improving short-term memory But for now continue lorazepam 0.5 mg QID Start clonidine 0.1 mg tablets one half at night for 3 nights, then 1/2 tablet twice daily for 3 nights, then one half in the morning and 1 tablet at night If clonidine does not noticeably help anxiety call the office  05/27/2021 phone call initially complaining of homicidal and suicidal thoughts after taking 1 mg of clonidine.  He was told to stop the medication.  But then called back stating he was having homicidal and suicidal thoughts from withdrawal from the medication which was not logical.  He agreed it was not logical and stated on 05/29/2021 that he felt better  06/23/2021 appointment the following noted: No clonidine. Sunday panic attack eating out and then having SI.  Stayed with father to feel safer. Intrusive thoughts of hurting parents or others.  HI only when has SI.  Usually panic without SI.  Then had intrusive thoughts about hurting others.   Usually intrusive thoughts are brief and fleeting.    07/20/21  appt noted; More anxious with less lithium.  More anxious in his body.  Not more depressed or irritable.  Sleep is the same.  More racing thoughts.  Chronic SI probably a litte worse but not unmanageable. Rduced lithium last week to 300 mg daily. No recent change in lisinopril. Off the clonidine for a week or so. Doesn't  drink much water.  Drinks a lot of diet coke. No marked akathisia or RLS but does shake his leg DT anxiety. Worries about needing mor emedicine. Plan: Increase olanzapine to 25 mg nightly bc more racing thoughts and anxiety with less lithium DT severity of sx and difficulty getting off Seroquel conside rincrease.  07/30/21 TC : CO racing SI today and wonders about whether increased sx racing thoughts, hyperactivity, SI related to Reduced lithium from 600 mg daily to 300 mg daily recently DT lithium level 1.8.  could be related.    Plan: increase olanzapine 30 mg pm Today take lithium 600 mg now.  Tomorrow start lithium CR 450 mg daily Continue olanzapine 30 mg PM until SI resolves unless akathisia returns.  Meredith Staggers, MD, DFAPA  08/17/21 appt noted:  No akathisia with olanzapine 30 mg pm (about 3 weeks) and is sleeping better. Increased lithium to 450 mg daily. Tolerating meds. Still racing negative thoughts maybe some better.  Asked about it. SI come and go.   Anxiety about the same as depression.  No motivation unless has to do something. Plan: Reduce  fluoxetine 20 mg 1 daily in hopes racing thoughts and anxiety are better.  09/14/2021 appointment with the following noted: No difference noted with reduction fluoxetine 20 mg daily. Most of the time negative thoughts, ex "what if I get in a wreck?"  Frequent negative thoughts.   Still on lorazepam 0.5 mg TID.   Wonders about trying prior meds that caused akathisia bc no longer gets it with olanzapine 30 mg daily. Sleep is usually ok.  Taking Seroquel 200 mg HS.  Thinks the olanzapine helps his sleep and doesn't want to stop it. Panic 1-2 times per month.  Then takes extra lorazepam. Plan: DC fluoxetine 20 mg 1 daily  Trial Auvelity off label for depression 1 in the AM for 1 week then 1 in AM and 1 with evening meal  10/15/2021 appointment with the following noted: Occ of getting confused as to day and went to church on a Thursday my  mistake.   Had it happen a few months ago too.  Memory is terrible. Thinks he couldn't tolerate more Auvelity. Not a lot of benefit from [place] 1 daily. Still quite a bit of anxiety.   A little drowsy  and sleeps 7-8 hours at night. Word finding problems.  Lost wallet. Plan: DC  Auvelity  For [facility]: memantine 1/2 tablet in the AM for 1 week, then 1/2 tablet twice daily, then 1/2 tablet in the AM and 1 tablet at night for 1 week then 1 tablet twice daily  10/22/2021 phone call complaining of additional stress asking to start new medication because of anxiety and more suicidal thoughts without intent or plan.  Reports taking more than 4 prescribed Ativan a day recently.  Asking to increase olanzapine which is already at 30 mg daily. MD response:We cannot go any higher in the dosage of olanzapine that he is currently taking.  A lot of these suicidal thoughts are apparently anxiety driven.  Let us have him resume taking paroxetine which is one of the more effective medicines for anxiety.  I will send it in and the instructions will be paroxetine 20 mg tablets 1/2 tablet daily for 1 week then 1 tablet daily  11/16/21 appt noted: Multiple phone calls since she was here.  Complaining of suicidal thoughts from memantine which was stopped. Started paroxetine and up to 20 mg daily.  Continues olanzapine 30 mg, lithium 450 mg nightly, lorazepam 0.5 mg 4 times daily , And quetiapine 200 mg nightly. Questions about whether memantine caused the neg SI really bc has them at times. When has SI often anxiety driven.  Will tend to ruminate on past relationship ended.   Edgy and irritable all the time but memantine seemed to make it worse. Mind is getting better with less SI lately.   No SE with paroxetine.   Aggrivated by forgetfulness.  eXample, talked with someone about going to breakfast last night and then forgot to go this mornin.g. Plan prescribed donepezil 5 mg daily for cognitive complaints specifically  forgetfulness  12/31/2021 appointment with the following noted: He had been prescribed donepezil 5 mg for cognitive complaints.  He called back reporting he felt it was causing homicidal thoughts and was instructed to stop it.  He has had these types of thoughts in the past.  He had no desire to act on them. Discouraged. Ongoing depression and reduced enjoyment and negative thoughts.  Can enjoy some things. A lot of anxiety chronically.  Trying to limit Ativan to 5 of 0.5 mg daily.  Sometimes needs 2. Constipation for years.  02/03/22 appt noted: Depression makes LBP worse. Ongoing chronic depression. Tolerated rivastigmine without triggering SI Chronic anxiety and Ativan helps some. Occ panic but not usual.  Asks if any other meds coud be used. Plan: But for now continue lorazepam 0.5 mg QID, but use LED for cog reasons Increase rivastigmine to 3 mg capsule 1 twice daily or 2 of the 1.5 mg capsules twice daily.  Call if you have problems with nausea Start dextromethorphan 1 capsule twice daily . Thi sis off label for depression based on MOA of Auvelity and use of paroxetine to prolong half life of DM. Presence of paroxetine may have been reason he didn't tolerate Auvelity brief trial before.  Will proceed slowly.  03/17/2022 appointment noted: Current psych meds: Rivastigmine 3 mg twice daily, quetiapine 200 mg nightly, paroxetine 40 mg daily, olanzapine 30 mg nightly, Namenda 10 mg daily, lorazepam 0.5 mg 4 times daily, lithium CR 450 mg nightly, dextromethorphan 15 mg twice daily A lot of anxiety since here.  Some days taken more lorazepam to 6-7 tablets daily.  No major triggers but anxious even about coming here and other normal things.  Cancelled trip to collect rocks with friend and cancelled the trip bc anxious.  Usually if can go he is ok.  Lots of SI ongoing Memory might be a little better. Asks how he responded to [employer].  Will check paper chart.  03/24/22 TC:  CO SI worse and  restilless with Rivastigmine 9 mg . MD response:  Stop the rivastigmine since the [place] appears to be having side effects from it.  He was having anxiety at the last appointment 2 and then we increased the dose.  This might be causing the anxiety to be worse. He has chronic suicidal thoughts without intent or plan.  However it appears the thoughts are more intense with the rivastigmine     05/06/21 appt noted: Stopped rivastigmine DT anxiety and worsening SI.  Was better the next day. This week had some SI.  Thinking about the prior relationship tends to lead to SI. Will take Ativan when gets SI and it helps a little at 0.5 mg at a time. Plan: Consider fluvoxamine.  Yes this may help with obsessions on old GF and he failed other optioons. Start fluvoxamine 1/2 of 50 mg tablet and reduce paroxetine to 1 and 1/2 of 20 mg tablets for 5 days, Then increase fluvoxamine to 1 tablet daily and reduce paroxetine to 1 tablet daily for 5 days, Then increase fluvoxamine to 1-1/2 tablets daily and reduce paroxetine to 1/2 tablet daily for 5 days, Then increase fluvoxamine to 2 tablets daily and stop paroxetine   06/07/22 appt noted: Feels like the fluvoxamine 50 BID is too much.  On this a couple of weeks. Worries he might be worse with this with regard to SI.    He thinks it's worse with increase.   New neighbor who has a young boy. He worries that  Reginal Lutes is triggering depression and intrusive SI/HI a couple of days later and lasts a little while. Has had anxiety turning into panic attacks.  This gets associated with depression and SI/HI.  These thoughts are intrusive and he doesn't want to act on them.  Can have HI even towards family with whom he has no negative emotions.  Little things like a bump up in parking lot sets off his depression and anxiety and doesn't seem to handle things.   No sig akathisia.  In the past it's been continuous.   Awakens every 2-3 hours.  Usually goes back to sleep with  meds. Has not had alcohol in 20 years and had some intrusive thoughts without drinking.   Plan: As soon as he feels comfortable increase fluvoxamine to 50 mg AM and 100 mg PM for intrusive thoughts of HI/SI and obs on GF  07/06/22 appt noted: Increased fluvoxamine to 150 mg daily and felt more agitated and some SI so reduced to 100 mg 4 days ago and feels better so far.  Likes the fact it is good for anxiety and obs thoughts.  Wonders if there is a similar med.  Frustrated he can't get relief.  Some improvement in obs on exGF but not resolved. Continue other meds: lithium CR 450 mg HS, lorazepam 0.5 mg QID, Memantine 10 BID, olanzapine 30 mg HS, quetiapine 200 mg HS Tingling and burning in legs at night but doesn't interfere with sleep. [facility] [employer] and lost 13#.   No full panic lately. But some anxiety waves.   Still major problems with memory. Plan: Wean fluvoxamine 50 mg daily for a month then 1/2 daily for 2 weeks then stop it DT  NR.  Watch for more anxiety.  07/28/22 TC: Franchot Erichsen, CMA  to Me     07/28/22  4:16 PM Note Mom called reporting patient has been confused, dizzy, and sluggish for the last 3 days. He is now staying with her. She had several questions in regards to this:   Lithium level - she feels he needs one done, last result in Epic was 08/2021 that I see. She would like order sent to [facility] for tomorrow.   Patient is on Wegovy, has been for about a month, and she wonders if this could cause sx.   Patient has meclizine and she is questioning if she should give that for his dizziness considering other sx.   She questions if perhaps he got up during the night and took additional medications, though doesn't know if this happened and if so what he took.    On 3/5 visit he was to wean fluvoxamine to 50 mg qd for a month and then 1/2 tab for 2 weeks and then stop. Mom and pt unsure if he started the wean.    He has F/U 4/4.        Me  to Franchot Erichsen, CMA   CC   07/28/22  5:44 PM Note Yes get lithium level in the AM and don't take any lithium tonight or in the morning before the blood test.     Reginal Lutes should not cause these sx except if he is losing wt quickly or is dehydrated it can cause lithium toxicity and include these sx.  He needs to drink lots of water.   He should go ahead and wean the fluvoxamine as instructed in the last appt.   If the sx get worse, go  to the ER.    Me   CC   07/29/22  6:49 PM Note RTC   Sx not as severe as when he had to go to hospital in the past.  Albert [name] got Lithium level today.   Speech is not good .  Confused at times, but better today.  A little tremor .  A little balance problems.  No falls.  No diarrhea Took lihtium 450 last night. Mo says he got up in the middle of the night and moved things around last night and he doesn't rmember.   No change in night time meds.   Gradually reducing fluvoxamine.   No extra lorazepam.     Spoke with mother who verifies the same.   No med changes but push fluids.   Meredith Staggers, MD, DFAPA      08/05/22 appt : with parents Pt hosp 08/02/22 with acute kidney injury and somewhat elevated lithium level with some confusion.  Spoke with psych NP at hosp and lithium was decreased to 450 mg HS. No pending appt known with medical doctors about kidney function. Don't feel good and having HI and SI and feels he needs lithium for these issues.  Doesn't feel he should have been discharged.   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 No added meds at night but has been confused at night and getting up multiple times and doing things he doesn't remember.  This concerns parents.  Parents been up at night with him and he doesn't make sense.  Still groggy in the am per mo for an hour.   Plan: Plan: Get lithium level and kidney test ASAP Stop fluvoxamine Reduce lithium 300 mg capsule 1 at night Monday night: reduce to olanzapine to 20 mg at night (stop the 10 mg tablet) and  start clozapine prescription. Monday 4/15 : increase clozapine to 100 mg at night and reduce quetiapine to 1/2 of 200 mg tablet and continue olanzapine 20 mg each evening.  08/01/22 & 08/11/22 ED for HI and SI  08/12/22 TC mother:  Mother, Patsy called. Per note made by Larinda Buttery. He was referred to [facility].Reporting that he did go up there last night. This morning they called her and told her to come pick him up. They told her he wasn't having homicidal or suicidal thoughts and it would be four to five days of him waiting to go to a facility in [place]. They recommended him for PHP to start 4/16. She picked him up today. He is worried about these thoughts coming back up. Also, [facility] came out yesterday to assess him and offered counseling from [facility]. He wants to do counseling with them.  Any input on care he can receive?  He is so discouraged and wants to be safe.  (She notes that the [facility] gave him- Zyprexa 10mg  at 1:07am and again at 1:25am per the med list- based on his previous med list) He was supposed to be off of Zyprexa10mg . Has been on  Clozapine since Monday. He did go ahead and take it before he left ( 2pills) so he hasn't missed a dose and did go get labwork done. Is wanting to continue the Clozapine, please check the labwork to be able to fill the 100mg  dose.    MD resp:  Continue all meds as RX except go ahead and increase clozapine to 100 mg tablets, 1 each night as soon as he can get the 100 mg tablets from pharmacy.  RX sent.  Trying  to speed up his chance of response by getting it to a hgiher dose.  Clozapine is very effective for HI/SI.  If he gets worsening HI/SI in the interim it will not be caused by meds but just the natural waxing and waining of his sx.  Once clozapine dose gets high enough it should help but avg patient needs 300 mg so we have to just be patient.      08/20/22 appt:  Up to 100 mg clozapine HS SE constipation and using Colace, Miralax.  Been to [facility] twice  since here.    Ongoing continuous HI/SI.   All I want to do is go to sleep to escape.  Dep causing the thoughts.   Taking Ativan 0.5 mg BID-QID.  He thinks mother not giving him enough of it.  He thinks anxiety triggers HI/SI.  He fears HI and hopes he's strong enough to reisist.  Not angry or wanting to hurt anyone in reality but has the intrusive thoughts. Conc and memory still not good.   Sleep not much different.   Staying with parents currently. Dep gotten worse M worrying he's dependent on lorazepam.   Confusion at night is no longer happening.  Not wandering at night.  Plan: For severe constipation start Linzess 145 mg AM Get lithium level and kidney test ASAP Stopped fluvoxamine and confusion at night is gone  lithium 300 mg capsule 1 at night  olanzapine to 20 mg at night  increase clozapine to 150 mg at night for 3 nights then 200 mg HS   olanzapine 20 mg each evening. Continue lorazepam 0.5 mg QID, needs this and informed mother  09/07/22 appt noted: Psych meds: clozapine 250 MG HS, lithium 300 mg HS, lorazepam 0.5 mg BID-QID, olanzapine 20 .  Off Seroquel, linzess 290 mg daily, Miralax, Colace. Sleep 7-8 hours without change from before clozapine.   Mood is still very depressed and still gets anxiety. Still problems with constipation.   Dep is so much it is causing intrusive HI/SI.   No more bizarre behaviors at night.   Plan: Plan: Get lithium level and kidney test ASAP  lithium 300 mg capsule 1 at night  olanzapine to 20 mg at night  increase clozapine to  300 mg HS as soon as tolerated and then 400 mg HS.  olanzapine 20 mg each evening. Continue lorazepam 0.5 mg QID, needs this and informed mother  10/07/22 appt noted: alone and with parents Rough month.  Altered voice for a couple of weeks or so..  Mind not good.  Feel out of sorts.  Jittery.  Constipation.  No appetite.  On Wegovy.  Saw GI doc.   May need to reduce Wegovy bc poor appetite and constipation.  Lost 25 #. Current psych  med: clozapine 400 mg HS about a week or 2, olanzapine 20 mg HS, lithium 300 mg HS, lorazepam 0.5 mg QID. Reduced intrusive thoughts.  2 nights since here night eating.   Sleep 6 hours.  Limited napping during the day.   Anhedonia.  Lower motivation.  Tremor worse. M says he sleeps pretty well but awakens  BP is under control.  Psych med hx extensive including ECT and  risperidone, Zyprexa 30 Latuda 80 which caused akathisia,  Vraylar, Rexulti, aripiprazole 20 mg with akathisia,  Seroquel 1000 mg,  InVega, Geodon, Saphris with side effects, symbyax, Fanapt NR .  Caplyta SE and markedly worse. lithium 1200 SE,  lamotrigine 300 mg, Depakote 2000 mg, Tegretol, Trileptal  and several of these in combinations, gabapentin,  N-acetylcysteine, Nuedexta,     Belsomra with no response,  Lunesta no response, trazodone 200 mg,  Xanax, clonazepam, lorazepam less sedation. Buspirone NR Clonidine SE with 2 trials  Viibryd 40 mg for 3 months with diarrhea,  protriptyline with side effects,  Trintellix 20 mg,  Parnate 50 mg with no response,   Emsam 12 mg for 2 months,   imipramine, venlafaxine,   bupropion was side effects,  Lexapro 20 mg, sertraline, paroxetine, Deplin, fluoxetine 80,  fluvoxamine 150 agitated.  Confusion when with clozapine. Auvelity NR at one daily.  SE BID  methylphenidate 60 mg,  Vyvanse, Concerta, strattera, , modafinil,  Memantine worsening SI? Donepezil, complained of HI, Exelon 3 BID rivastigmine DT worsening SI  pramipexole,  amantadine ,  Patient prone to akathisia.  [facility]   Review of Systems:  Review of Systems  Constitutional:  Positive for appetite change and fatigue.  Respiratory:  Negative for shortness of breath.   Cardiovascular:  Negative for chest pain and palpitations.  Musculoskeletal:  Positive for back pain.  Neurological:  Positive for dizziness and tremors.       Fidgety  Psychiatric/Behavioral:  Positive for  confusion, decreased concentration and suicidal ideas. Negative for agitation, behavioral problems, dysphoric mood, hallucinations, self-injury and sleep disturbance. The patient is nervous/anxious. The patient is not hyperactive.     Medications: I have reviewed the patient's current medications.  Current Outpatient Medications  Medication Sig Dispense Refill   acetaminophen (TYLENOL) 500 MG tablet Take 1,000 mg by mouth every 6 (six) hours as needed for moderate pain.     bisacodyl (DULCOLAX) 10 MG suppository Place 1 suppository (10 mg total) rectally as needed for moderate constipation. 12 suppository 0   cloZAPine (CLOZARIL) 200 MG tablet Take 2 tablets (400 mg total) by mouth at bedtime. 14 tablet 1   esomeprazole (NEXIUM) 40 MG capsule Take 40 mg by mouth daily.     fenofibrate (TRICOR) 145 MG tablet Take 145 mg by mouth daily.     fluticasone (FLONASE) 50 MCG/ACT nasal spray Place 1 spray into both nostrils daily as needed for allergies or rhinitis.     hydrocortisone (ANUSOL-HC) 2.5 % rectal cream Place 1 application rectally 3 (three) times daily as needed for hemorrhoids.     lactulose (CHRONULAC) 10 GM/15ML solution Take 22.5-45 mLs (15-30 g total) by mouth 2 (two) times daily. 473 mL 0   levothyroxine (SYNTHROID, LEVOTHROID) 75 MCG tablet Take 75 mcg by mouth daily before breakfast.     lisinopril (ZESTRIL) 5 MG tablet Take 5 mg by mouth daily.     lithium carbonate 300 MG capsule TAKE 1 CAPSULE BY MOUTH AT BEDTIME 30 capsule 0   LORazepam (ATIVAN) 0.5 MG tablet Take 1 tablet (0.5 mg total) by mouth 4 (four) times daily. 120 tablet 1   lubiprostone (AMITIZA) 24 MCG capsule Take 24 mcg by mouth daily with breakfast.     meclizine (ANTIVERT) 25 MG tablet Take 1 tablet (25 mg total) by mouth 3 (three) times daily as needed for dizziness. 30 tablet 1   OLANZapine (ZYPREXA) 20 MG tablet TAKE ONE TABLET BY MOUTH AT BEDTIME 30 tablet 0   polycarbophil (FIBERCON) 625 MG tablet Take 1,250  mg by mouth daily.     sildenafil (VIAGRA) 100 MG tablet Take 1 tablet (100 mg total) by mouth daily as needed for erectile dysfunction. 30 tablet 6   tamsulosin (FLOMAX) 0.4 MG CAPS  capsule Take 1 capsule (0.4 mg total) by mouth daily. 90 capsule 3   WEGOVY 1 MG/0.5ML SOAJ Inject 1 mg into the skin once a week.     No current facility-administered medications for this visit.   Medication Side Effects: Other: mild sleepiness.   Occ twitches.\, tremor  Allergies:  Allergies  Allergen Reactions   Meloxicam Other (See Comments)    Dizziness   Nsaids Other (See Comments)    Hallucinations   Ibuprofen Other (See Comments)    Can not take because taking lithium   Prednisone Other (See Comments)    Can't sleep    Wellbutrin [Bupropion] Other (See Comments)    Suicidal thoughts    Past Medical History:  Diagnosis Date   ADHD (attention deficit hyperactivity disorder)    Anemia    Anxiety    Bipolar disorder (HCC)    BPH (benign prostatic hyperplasia)    Chronic kidney disease, stage 3b (HCC)    Constipation    DDD (degenerative disc disease), cervical    Depression    Deviated septum    ED (erectile dysfunction)    GERD (gastroesophageal reflux disease)    Graves disease    History of hiatal hernia    Hypertension    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    Hypothyroidism    Pneumonia    PONV (postoperative nausea and vomiting)     Family History  Problem Relation Age of Onset   Prostate cancer Neg Hx    Bladder Cancer Neg Hx    Kidney cancer Neg Hx     Social History   Socioeconomic History   Marital status: Single    Spouse name: Not on file   Number of children: Not on file   Years of education: Not on file   Highest education level: Not on file  Occupational History   Not on file  Tobacco Use   Smoking status: Former    Types: Cigarettes    Passive exposure: Past   Smokeless tobacco: Former    Types: Associate Professor  Use: Never used  Substance and Sexual Activity   Alcohol use: Not Currently   Drug use: Never   Sexual activity: Yes  Other Topics Concern   Not on file  Social History Narrative   Not on file   Social Determinants of Health   Financial Resource Strain: Not on file  Food Insecurity: Not on file  Transportation Needs: Not on file  Physical Activity: Not on file  Stress: Not on file  Social Connections: Not on file  Intimate Partner Violence: Not on file    Past Medical History, Surgical history, Social history, and Family history were reviewed and updated as appropriate.   Please see review of systems for further details on the patient's review from today.   Objective:   Physical Exam:  There were no vitals taken for this visit.  Physical Exam Constitutional:      General: He is not in acute distress.    Appearance: He is well-developed.  Musculoskeletal:        General: No deformity.  Neurological:     Mental Status: He is alert and oriented to person, place, and time.     Cranial Nerves: No dysarthria.     Motor: Tremor present.     Coordination: Coordination normal.     Comments: mild tremor  Psychiatric:        Attention  and Perception: Attention and perception normal. He does not perceive auditory or visual hallucinations.        Mood and Affect: Mood is anxious and depressed. Affect is not labile, blunt, angry, tearful or inappropriate.        Speech: Speech normal. Speech is not slurred.        Behavior: Behavior normal. Behavior is not slowed. Behavior is cooperative.        Thought Content: Thought content is not delusional. Thought content does not include homicidal or suicidal ideation. Thought content does not include suicidal plan.        Cognition and Memory: Cognition normal. He exhibits impaired recent memory.        Judgment: Judgment normal.     Comments: Insight fair Chronically talkative .directable. But chronically mildly pressured. He is  chronically anxious   No manic signs noted. Some wordfinding problems ongoing. Intrusive thoughts better with clozapine Fidgety today but not uncomfortable Alert and oriented.      Lab Review:     Component Value Date/Time   NA 133 (L) 08/12/2022 0029   NA 138 08/05/2022 1630   NA 142 03/20/2014 0514   K 3.9 08/12/2022 0029   K 4.1 03/20/2014 0514   CL 103 08/12/2022 0029   CL 112 (H) 03/20/2014 0514   CO2 19 (L) 08/12/2022 0029   CO2 26 03/20/2014 0514   GLUCOSE 101 (H) 08/12/2022 0029   GLUCOSE 99 03/20/2014 0514   BUN 14 08/12/2022 0029   BUN 15 08/05/2022 1630   BUN 7 03/20/2014 0514   CREATININE 1.85 (H) 08/12/2022 0029   CREATININE 0.78 03/20/2014 0514   CALCIUM 9.6 08/12/2022 0029   CALCIUM 9.5 03/20/2014 0514   PROT 6.7 08/12/2022 0029   PROT 7.3 03/18/2014 1459   ALBUMIN 4.2 08/12/2022 0029   ALBUMIN 3.2 (L) 03/18/2014 1459   AST 18 08/12/2022 0029   AST 16 03/18/2014 1459   ALT 14 08/12/2022 0029   ALT 16 03/18/2014 1459   ALKPHOS 24 (L) 08/12/2022 0029   ALKPHOS 105 03/18/2014 1459   BILITOT 0.5 08/12/2022 0029   BILITOT 0.3 03/18/2014 1459   GFRNONAA 42 (L) 08/12/2022 0029   GFRNONAA >60 03/20/2014 0514   GFRAA 54 (L) 04/18/2019 1612   GFRAA >60 03/20/2014 0514       Component Value Date/Time   WBC 10.1 10/06/2022 1131   WBC 6.8 08/12/2022 0029   RBC 3.84 (L) 10/06/2022 1131   RBC 3.72 (L) 08/12/2022 0029   HGB 12.0 (L) 10/06/2022 1131   HCT 35.5 (L) 10/06/2022 1131   PLT 464 (H) 10/06/2022 1131   MCV 92 10/06/2022 1131   MCV 89 03/20/2014 0514   MCH 31.3 10/06/2022 1131   MCH 31.5 08/12/2022 0029   MCHC 33.8 10/06/2022 1131   MCHC 33.4 08/12/2022 0029   RDW 11.7 10/06/2022 1131   RDW 12.5 03/20/2014 0514   LYMPHSABS 1.6 10/06/2022 1131   LYMPHSABS 3.2 03/20/2014 0514   MONOABS 0.5 08/12/2022 0029   MONOABS 1.0 03/20/2014 0514   EOSABS 0.2 10/06/2022 1131   EOSABS 0.4 03/20/2014 0514   BASOSABS 0.1 10/06/2022 1131   BASOSABS 0.1  03/20/2014 0514    Lithium Lvl  Date Value Ref Range Status  08/05/2022 1.1 0.5 - 1.2 mmol/L Final    Comment:    A concentration of 0.5-0.8 mmol/L is advised for long-term use; concentrations of up to 1.2 mmol/L may be necessary during acute treatment.  Detection Limit = 0.1                           <0.1 indicates None Detected    08/12/21 lithium level 0.9 on 450 mg.  lithium level Sept 0.8.   lithium level July 27, 2018 was normal at 1.0.   Lithium level [facility] October 03, 2018 = 1.2. Said he got lithium level at [facility] as requested.  Labs not in Epic.  Recent lipids ok except higher TG than usual.  Normal A1C.  No results found for: "PHENYTOIN", "PHENOBARB", "VALPROATE", "CBMZ"   .res Assessment: Plan:    Leart was seen today for follow-up, depression, anxiety and medication reaction.  Diagnoses and all orders for this visit:  Severe bipolar I disorder, current or most recent episode depressed (HCC)  GAD (generalized anxiety disorder)  Drug-induced constipation  Long term current use of clozapine  Panic disorder with agoraphobia  Insomnia due to mental condition  Mild cognitive impairment  Generalized anxiety disorder  Lithium use   50 min appt with pt alone and then with parents:  needs a lot of time. Chronic TR bipolar mixed and chronic anxiety.  He usually has mixed bipolar symptoms which we have not been able to completely eliminate.  See long list of meds tried.   He is  chronically unstable   Recent hosp 08/02/22 with SI and acute kidney injury and elevated lithium. Processed this and disc goal of trying to stop lithium but risk of worsening SI.  He has chronic SI and there's risk of this worsening off lithium.  Lithium and clozapine are the most effective meds for SI.  Possible that [facility] made him dehydrated and constipation and caused incr lithium level.  Had 15-20 # wt loss.  Discussed mother's concerns about his memory  which is primarily a short-term memory issue.  We discussed the alternative options of using Namenda off label for mild cognitive impairment.  We are not yet able to reduce the dose of lorazepam but that could potentially help as well.  His memory was much worse on Xanax than lorazepam. We discussed the short-term risks associated with benzodiazepines including sedation and increased fall risk among others.  Discussed long-term side effect risk including dependence, potential withdrawal symptoms, and the potential eventual dose-related risk of dementia. Disc newer studies refute dementia risks.  Unfortunately due to the severity of set his symptoms polypharmacy is a necessity.  We are reducing meds to day to address.    Lithium being used bc chronic SI and death thoughts but it is better with clozapine.   Counseled patient regarding potential benefits, risks, and side effects of lithium to include potential risk of lithium affecting thyroid and renal function.  Discussed need for periodic lab monitoring to determine drug level and to assess for potential adverse effects.  Counseled patient regarding signs and symptoms of lithium toxicity and advised that they notify office immediately or seek urgent medical attention if experiencing these signs and symptoms.  Patient advised to contact office with any questions or concerns.  lithium  300 mg daily.  Will plan to stop bc of recent toxicity and SI less with clozapine. Reduce lithium to 150 mg daily Checked lithium level and BMP and level 1.1 on 300 mg Daily so reduce again. Call if death thoughts worsen or worsening SI.   Disc SE. Disc combo with Seroquel may lower response rate but he doesn't think he can sleep without  Seroquel.  Disc in detail again clozapine.  Disc data showing less SI with it and it has reduced to   Disc risk in detail including low WBC with complication, myocarditis.  Extensive discussion of CBC monitoring.   Disc this again bc  taking Wegovy might help him to tolerate it better.  He's started [facility] and lost 25 #.  Did he take duloxetine?  Might help depression more.  Consider retry pramipexole off label   But for now continue lorazepam 0.5 mg QID prn,  but use LED for cog reasons  For [facility] had to stop rivastigmine DT worsening SI  CBT dealing with obsession around old GF discussed  We discussed the short-term risks associated with benzodiazepines including sedation and increased fall risk among others.  Discussed long-term side effect risk including dependence, potential withdrawal symptoms, and the potential eventual dose-related risk of dementia.  But recent studies from 2020 dispute this association between benzodiazepines and dementia risk. Newer studies in 2020 do not support an association with dementia.  Discussed safety plan at length with patient.  Advised patient to contact office with any worsening signs and symptoms.  Instructed patient to go to the [facility] emergency room for evaluation if experiencing any acute safety concerns, to include suicidal intent.  He commits to safety.  Disc Ozempic and Monjauro for weight loss.  Has had weight gain from meds as a contributor. But lately has lost some weight.  Disc Dr. Pollie Meyer mentioned evidence it could help depression.  I rec he continue this also.  Unlikely to worsen SI.  He's making progress.  Needs to keep up activity as much as possible for depression.  Take Miralax daily for constipation.  Constipation management 1.  Lots of water 2.  Powdered fiber supplement such as MiraLAX, Citrucel, etc. preferably with a meal 3.  2 stool softeners a day 4.  Milk of magnesia or magnesium tablets if needed For severe constipation seeing GI  Had constipation before clozapine and GI appt in June started Amitza which helped some. Needs to push fluids.    Answered questions about labs with chronic anemia and borderline high platelets.  Plan Reduce lithium to  150 mg capsule 1 at night DT level higher Reduce  olanzapine to 10 mg at night  Continue clozapine 400 mg HS bc helping SI and HI and  Continue lorazepam 0.5 mg QID, needs this and informed mother  FU 4 weeks   Meredith Staggers, MD, DFAPA  Future Appointments  Date Time Provider Department Center  11/08/2022  1:30 PM Cottle, Steva Ready., MD [facility] None  12/09/2022  1:00 PM Cottle, Steva Ready., MD [facility] None  12/30/2022  2:30 PM Sondra Come, MD [facility] None    No orders of the defined types were placed in this encounter.      -------------------------------

## 2022-10-13 ENCOUNTER — Other Ambulatory Visit: Payer: Self-pay | Admitting: Psychiatry

## 2022-10-13 ENCOUNTER — Telehealth: Payer: Self-pay | Admitting: Psychiatry

## 2022-10-13 DIAGNOSIS — F314 Bipolar disorder, current episode depressed, severe, without psychotic features: Secondary | ICD-10-CM

## 2022-10-13 DIAGNOSIS — Z79899 Other long term (current) drug therapy: Secondary | ICD-10-CM

## 2022-10-13 NOTE — Telephone Encounter (Signed)
Patient continues to c/o constipation. Recommended he drink more fluids and to follow with GI. He also mentions continued jerking, shaking, being sluggish in the morning and no energy. I asked him if the Ativan was helping him, especially in the morning and reviewed frequency to take. His anxiety seems to be the cause for at least part of the sx, rates as 7/10. He is not relating anything specifically to clozapine. He is not claiming to have SI/HI currently. He is scheduled for FU 7/8.

## 2022-10-13 NOTE — Telephone Encounter (Signed)
Pt called to advise he had his bloodwork done today.  He said he wanted to report to Dr Jennelle Human that he is still constipated, still has jerking and shaking, is sluggish in the morning and has no energy.  Pls call him back to discuss.  Next appt 7/8

## 2022-10-13 NOTE — Telephone Encounter (Signed)
He has a GI now and they need to manage the constipation because they know more about that and I do. We just reduced the lithium to 150 mg daily and the olanzapine to 10 mg daily last week.  I do not think we should make another medication change this soon.  Give it another week and see if the symptoms gradually improved since the medication reduction.

## 2022-10-14 LAB — CBC WITH DIFFERENTIAL/PLATELET
Basophils Absolute: 0.1 10*3/uL (ref 0.0–0.2)
Basos: 0 %
EOS (ABSOLUTE): 0.1 10*3/uL (ref 0.0–0.4)
Eos: 1 %
Hematocrit: 35.3 % — ABNORMAL LOW (ref 37.5–51.0)
Hemoglobin: 11.6 g/dL — ABNORMAL LOW (ref 13.0–17.7)
Immature Grans (Abs): 0 10*3/uL (ref 0.0–0.1)
Immature Granulocytes: 0 %
Lymphocytes Absolute: 1.7 10*3/uL (ref 0.7–3.1)
Lymphs: 15 %
MCH: 30.5 pg (ref 26.6–33.0)
MCHC: 32.9 g/dL (ref 31.5–35.7)
MCV: 93 fL (ref 79–97)
Monocytes Absolute: 0.5 10*3/uL (ref 0.1–0.9)
Monocytes: 5 %
Neutrophils Absolute: 8.9 10*3/uL — ABNORMAL HIGH (ref 1.4–7.0)
Neutrophils: 79 %
Platelets: 568 10*3/uL — ABNORMAL HIGH (ref 150–450)
RBC: 3.8 x10E6/uL — ABNORMAL LOW (ref 4.14–5.80)
RDW: 11.5 % — ABNORMAL LOW (ref 11.6–15.4)
WBC: 11.4 10*3/uL — ABNORMAL HIGH (ref 3.4–10.8)

## 2022-10-14 NOTE — Telephone Encounter (Signed)
LVM to RC 

## 2022-10-14 NOTE — Telephone Encounter (Signed)
Patient notified of recommendations. 

## 2022-10-19 ENCOUNTER — Other Ambulatory Visit: Payer: Self-pay | Admitting: Gastroenterology

## 2022-10-19 DIAGNOSIS — K5909 Other constipation: Secondary | ICD-10-CM

## 2022-10-20 ENCOUNTER — Other Ambulatory Visit: Payer: Self-pay | Admitting: Psychiatry

## 2022-10-21 ENCOUNTER — Ambulatory Visit
Admission: RE | Admit: 2022-10-21 | Discharge: 2022-10-21 | Disposition: A | Discharge status: Home / Self Care | Payer: Medicare PPO | Source: Ambulatory Visit | Attending: Gastroenterology | Admitting: Gastroenterology

## 2022-10-21 DIAGNOSIS — K5909 Other constipation: Secondary | ICD-10-CM

## 2022-10-21 LAB — CBC WITH DIFFERENTIAL/PLATELET
Basophils Absolute: 0 10*3/uL (ref 0.0–0.2)
Basos: 0 %
EOS (ABSOLUTE): 0.2 10*3/uL (ref 0.0–0.4)
Eos: 2 %
Hematocrit: 33.7 % — ABNORMAL LOW (ref 37.5–51.0)
Hemoglobin: 11.2 g/dL — ABNORMAL LOW (ref 13.0–17.7)
Immature Grans (Abs): 0 10*3/uL (ref 0.0–0.1)
Immature Granulocytes: 0 %
Lymphocytes Absolute: 1.3 10*3/uL (ref 0.7–3.1)
Lymphs: 15 %
MCH: 31.2 pg (ref 26.6–33.0)
MCHC: 33.2 g/dL (ref 31.5–35.7)
MCV: 94 fL (ref 79–97)
Monocytes Absolute: 0.4 10*3/uL (ref 0.1–0.9)
Monocytes: 5 %
Neutrophils Absolute: 7.2 10*3/uL — ABNORMAL HIGH (ref 1.4–7.0)
Neutrophils: 78 %
Platelets: 545 10*3/uL — ABNORMAL HIGH (ref 150–450)
RBC: 3.59 x10E6/uL — ABNORMAL LOW (ref 4.14–5.80)
RDW: 11.5 % — ABNORMAL LOW (ref 11.6–15.4)
WBC: 9.3 10*3/uL (ref 3.4–10.8)

## 2022-10-21 LAB — BASIC METABOLIC PANEL
BUN/Creatinine Ratio: 13 (ref 9–20)
BUN: 22 mg/dL (ref 6–24)
CO2: 20 mmol/L (ref 20–29)
Calcium: 9.8 mg/dL (ref 8.7–10.2)
Chloride: 101 mmol/L (ref 96–106)
Creatinine, Ser: 1.68 mg/dL — ABNORMAL HIGH (ref 0.76–1.27)
Glucose: 150 mg/dL — ABNORMAL HIGH (ref 70–99)
Potassium: 5.2 mmol/L (ref 3.5–5.2)
Sodium: 135 mmol/L (ref 134–144)
eGFR: 47 mL/min/{1.73_m2} — ABNORMAL LOW (ref >59)

## 2022-10-21 LAB — LITHIUM LEVEL: Lithium Lvl: 0.4 mmol/L — ABNORMAL LOW (ref 0.5–1.2)

## 2022-10-25 ENCOUNTER — Other Ambulatory Visit: Payer: Self-pay | Admitting: Psychiatry

## 2022-10-25 DIAGNOSIS — Z79899 Other long term (current) drug therapy: Secondary | ICD-10-CM

## 2022-10-25 DIAGNOSIS — F314 Bipolar disorder, current episode depressed, severe, without psychotic features: Secondary | ICD-10-CM

## 2022-10-27 ENCOUNTER — Telehealth: Payer: Self-pay | Admitting: Psychiatry

## 2022-10-27 ENCOUNTER — Other Ambulatory Visit: Payer: Self-pay | Admitting: Psychiatry

## 2022-10-27 DIAGNOSIS — F314 Bipolar disorder, current episode depressed, severe, without psychotic features: Secondary | ICD-10-CM

## 2022-10-27 NOTE — Telephone Encounter (Signed)
Results will be put in [facility] tomorrow.

## 2022-10-27 NOTE — Telephone Encounter (Signed)
Pt called and said that he had his labs drawn today for his  clozaril and the results should be in tomorrrow. Please fax [facility] to  [facility] in [place]

## 2022-10-28 LAB — CBC WITH DIFFERENTIAL/PLATELET
Basophils Absolute: 0.1 10*3/uL (ref 0.0–0.2)
Basos: 1 %
EOS (ABSOLUTE): 0.2 10*3/uL (ref 0.0–0.4)
Eos: 4 %
Hematocrit: 33.4 % — ABNORMAL LOW (ref 37.5–51.0)
Hemoglobin: 11.2 g/dL — ABNORMAL LOW (ref 13.0–17.7)
Immature Grans (Abs): 0 10*3/uL (ref 0.0–0.1)
Immature Granulocytes: 0 %
Lymphocytes Absolute: 1.2 10*3/uL (ref 0.7–3.1)
Lymphs: 18 %
MCH: 31.1 pg (ref 26.6–33.0)
MCHC: 33.5 g/dL (ref 31.5–35.7)
MCV: 93 fL (ref 79–97)
Monocytes Absolute: 0.5 10*3/uL (ref 0.1–0.9)
Monocytes: 7 %
Neutrophils Absolute: 4.8 10*3/uL (ref 1.4–7.0)
Neutrophils: 70 %
Platelets: 609 10*3/uL — ABNORMAL HIGH (ref 150–450)
RBC: 3.6 x10E6/uL — ABNORMAL LOW (ref 4.14–5.80)
RDW: 11.7 % (ref 11.6–15.4)
WBC: 6.7 10*3/uL (ref 3.4–10.8)

## 2022-10-29 MED ORDER — CLOZAPINE 200 MG PO TABS
400.00 mg | ORAL_TABLET | Freq: Every day | ORAL | 1 refills | Status: DC
Start: 2022-10-29 — End: 2022-11-08

## 2022-10-29 NOTE — Telephone Encounter (Signed)
Labs put in [facility] on 10/28/2022, refills sent.   Pt calls to report same symptoms, constipation and I reminded him he needs to refer back to his GI doctor at this point. He verbalized understanding. Then reports the medication is still not working, he has SI/HI, does [employer]. Advised him to stay at same dose of 400 mg right now but I would discuss with Dr. Jennelle Human when I was able to reach him on vacation. He verbalized understanding as well on that.

## 2022-10-31 NOTE — Telephone Encounter (Signed)
He is complicated . No change until I return.

## 2022-11-01 ENCOUNTER — Telehealth: Payer: Self-pay | Admitting: Psychiatry

## 2022-11-01 NOTE — Telephone Encounter (Signed)
Pt called asking for Traci. Had talked to her & checking status on previous call.Would like RTC. [phone]

## 2022-11-01 NOTE — Telephone Encounter (Signed)
No changes per Dr. Jennelle Human like I told him previously. I will call him this afternoon.

## 2022-11-02 NOTE — Telephone Encounter (Signed)
Rtc to pt and advised him no changes until Dr. Jennelle Human returns, he understood. He did report he has had a BM if Dr. Jennelle Human was going to change anything for next week when he returns.

## 2022-11-03 ENCOUNTER — Telehealth: Payer: Self-pay | Admitting: Psychiatry

## 2022-11-03 ENCOUNTER — Other Ambulatory Visit: Payer: Self-pay | Admitting: Psychiatry

## 2022-11-03 NOTE — Telephone Encounter (Signed)
From what I can see patient has a Rx for clozapine with a RF available. Pharmacy is closed tomorrow but will fill on Friday. Patient notified.

## 2022-11-03 NOTE — Telephone Encounter (Signed)
Pt called at 1:26p requesting [place] to send in script for Clozapine to   [facility] - [place], [place] - [address] [place], [place] [address] Phone: [phone]  Fax: [phone]      He gets it every week.   Next appt 7/8

## 2022-11-04 LAB — CBC WITH DIFFERENTIAL/PLATELET
Basophils Absolute: 0 10*3/uL (ref 0.0–0.2)
Basos: 1 %
EOS (ABSOLUTE): 0.1 10*3/uL (ref 0.0–0.4)
Eos: 2 %
Hematocrit: 34 % — ABNORMAL LOW (ref 37.5–51.0)
Hemoglobin: 11.7 g/dL — ABNORMAL LOW (ref 13.0–17.7)
Immature Grans (Abs): 0 10*3/uL (ref 0.0–0.1)
Immature Granulocytes: 0 %
Lymphocytes Absolute: 1.3 10*3/uL (ref 0.7–3.1)
Lymphs: 22 %
MCH: 31 pg (ref 26.6–33.0)
MCHC: 34.4 g/dL (ref 31.5–35.7)
MCV: 90 fL (ref 79–97)
Monocytes Absolute: 0.6 10*3/uL (ref 0.1–0.9)
Monocytes: 9 %
Neutrophils Absolute: 4.1 10*3/uL (ref 1.4–7.0)
Neutrophils: 66 %
Platelets: 612 10*3/uL — ABNORMAL HIGH (ref 150–450)
RBC: 3.77 x10E6/uL — ABNORMAL LOW (ref 4.14–5.80)
RDW: 11.7 % (ref 11.6–15.4)
WBC: 6.2 10*3/uL (ref 3.4–10.8)

## 2022-11-08 ENCOUNTER — Encounter: Payer: Self-pay | Admitting: Psychiatry

## 2022-11-08 ENCOUNTER — Ambulatory Visit (INDEPENDENT_AMBULATORY_CARE_PROVIDER_SITE_OTHER): Payer: Medicare PPO | Admitting: Psychiatry

## 2022-11-08 DIAGNOSIS — F411 Generalized anxiety disorder: Secondary | ICD-10-CM

## 2022-11-08 DIAGNOSIS — F5105 Insomnia due to other mental disorder: Secondary | ICD-10-CM

## 2022-11-08 DIAGNOSIS — F4001 Agoraphobia with panic disorder: Secondary | ICD-10-CM

## 2022-11-08 DIAGNOSIS — G3184 Mild cognitive impairment, so stated: Secondary | ICD-10-CM

## 2022-11-08 DIAGNOSIS — K5903 Drug induced constipation: Secondary | ICD-10-CM | POA: Diagnosis not present

## 2022-11-08 DIAGNOSIS — F314 Bipolar disorder, current episode depressed, severe, without psychotic features: Secondary | ICD-10-CM

## 2022-11-08 MED ORDER — CLOZAPINE 200 MG PO TABS
400.0000 mg | ORAL_TABLET | Freq: Every day | ORAL | 4 refills | Status: DC
Start: 2022-11-08 — End: 2022-11-16

## 2022-11-08 NOTE — Progress Notes (Unsigned)
[name] [id number] 1966-01-28 57 y.o.    Subjective:   Patient ID:  Albert [name] is a 57 y.o. (DOB 08-29-1965) male.  Chief Complaint:  Chief Complaint  Patient presents with   Follow-up   Depression   Anxiety    Depression        Associated symptoms include decreased concentration, fatigue, appetite change and suicidal ideas.  Past medical history includes anxiety.   Anxiety Symptoms include confusion, decreased concentration, dizziness, nervous/anxious behavior and suicidal ideas. Patient reports no chest pain, palpitations or shortness of breath.      Albert [name] presents to the office today for follow-up of mixed bipolar, anxiety and still grieving stress of breakup.  He requires frequent follow-up because of long-term symptoms.  At visit October 25, 2018.  We made several medicine changes because of his concerns about sleep and other issues.  We increased olanzapine back to 7.5 mg bc not sleeping as well at 5 mg.  He has to have the prescription for quetiapine written for up to 3 tablets at night in case insomnia is worse because insomnia makes his mood disorder so much worse.  We discussed that was above the usual max but the request was granted given his treatment resistant status. We also reduce lithium from 900 mg daily to 750 mg daily to try to reduce tremor and muscle twitches.  At  visit March 28, 2019.  Fluoxetine was increased to 40 mg daily.   Early January 2021 increased to 60 mg daily.  No SE.  seen June 28, 2019.  No further meds were changed except olanzapine was increased back to 10 mg daily to see if if he could get additional benefit per his request.  Covid vaccinated.  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  Then had cholecystectomy.  April 2021 appt with the following noted: 2 episodes night sweats lately.  Memory has been very bad lately.  Repeatedly asks mother questions. Still  tend to stay in his room and his bed.  Still has rapid cycling mood swings.   Maybe some better with increase in olanzapine to 10 and tolerating itl.  Would like to get out more but can't DT Covid.  Can perform necessary chores.  Will get out of the house when he can.  No change in death thoughts and anxiety in intensity but is better with frequency.  Usually comes and goes in waves but more persistent.  Consistent with meds.  Ativan not helping anxiety very dramatically but he's not sure.  Fidgety.  No trigger other than still grieving relationship and can't get it out of his head.  Poor energy, concentration and more forgetful. In bed more and less active.  Sleep ok lately which is unusual.  Can concentrate on financial matters and stock market at times.  Tolerating meds.  Still some intrusive SI without reason. Less frequent obsessive thoughts about broken relationship and has been doing this for months.  Can't let it go.  Loop.   No unusual stress even with the family who is supportive. Likes the benefit that Zyprexa gives. No sleepwalking nor falling nor odd behavior.  No history of sleep walking.  He understands this may recur with an increase.  Also wants option to rarely take extra quetiapine 300 for sleep prn. Plan:  Try to reduce lorazepam if possible.  10/22/2019 appointment with the following noted: Continues fluoxetine 60, lithium 600 mg, lorazepam 2 mg AM and HS and 1 mg midday,  olanzapine 10, quetiapine 600 mg HS. Still anxious chronically and including driving in crowded spaces.  Doesn't thing Ativan helps as well as Xanax but less cognitive problems. Sleep is not as good.  Occ EFA.  More EMA and wanting to do things in the middle of the night but this is not typical.  Wonders about why that happens.  Some napping.   Tolerating meds.  Asks about weight loss meds.  Disc this in detail.   Plan no changes except OK meclizine prn vertigo.  02/11/20 appt with the following noted: Able to gradually reduce lorazepam to 1 mg AM and HS. More depressed over time and less  interested in things and less interest in going out but does with his parents. Has reduced from 800 to 600 mg HS with some awakening but usually able to go back to sleep.[name]  Spending a good amount of time in bed bc watches TV in bedroom.  Parents watch TV in different part of the house.  No mood swings he notices.   Concerns about weight gain about 200#. Likes olanzapine's benefit for sleep. Plan: Trial for TRD to  Increase fluoxetine to 80 mg daily  to use the combo with olanzapine for TR bipolar depression.   04/21/20 appt with following noted: I thought in beginning some benefit with fluoxetine.  Lifelong negative thinking continues.   Not much bipolar.  Reads a lot on bipolar.   Still tired and anxious a little more may be seasonal.  No familial stressors.  Tends to lay down in afternoon.  Anxiety not over anything in particular.   No SE with fluoxetine. Took meclizine prn.  Easily motion sick.    Asks questions about newer drugs for bipolar like Caplyta. Plan:  No med changes  06/30/20 appt noted: Tired of wearing masks.  Vaccinated.  Asked questions about when this will end with mask mandate.  Mood up and down some since here and getting up 2-3 times.  Disc awakening problems.  Trying to do better bc night eating some.   Mood is about average today so far.  Albert [name] out with friends for breakfast.  Recognizes activity helps mood.   3 days in a row napped a lot in the afternoon.   Bed is a comfort place.  Gets bored and hard to motivate.  Tries to stay away from night eating and spending.   More depressed when alone and wonders about med changes. Plan: Failed response to olanzapine + fluoxetine for TR bipolar depression.   Reduce fluoxetine to 1 daily and reduce olanzapine to one half nightly for 10 days and then stop it. Then start Caplyta 1 daily  08/27/2020 appointment with the following noted: Patient decompensated with the above switch to Caplyta with intrusive suicidal thoughts and had  to be hospitalized for psychiatric reasons.  Multiple phone calls with family members since that time.  Spoke with the psychiatric nurse practitioner with the decision to restart olanzapine in place of the Caplyta given the patient was more stable while on the combination of Seroquel and olanzapine than with the Caplyta. Caplyta triggered HI/SI and depressed and confused on it, and agitated and still has some of it now. No akathisia. Frustrated with lack of therapy at the hospital.   Hydroxyzine didn't help jitteriness.  Feels some better.  Fleeting SI & HI but not obsessive like it was prior to hospitalization.  Feels a little amped up and nervous.  Scared of what could happen.  Apprehensive being  here talking about things. 5-6 hours sleep last night and would like to have more. At mom and dad's house right now and up more in the day. Inadvertently stopped lorazepam abruptly by mistake likely contributing to shakiness. Still some memory issues.  Trying to stay out of bed when watching TV.  Some awakening but managing. Occ fleeting SI and distracts himself.   Always spends a lot of time just laying around.   Can have anxiety for no reason like coming here, and varies in intensity without pattern.      Less panic than in the past.  Some chronic depression and hyperactivity and loudness and hyperverbal.  Usually back to sleep.  Total 6 hours but some napping, awakens 2-4 times nightly but back to sleep..  Taking quetiapine 600 mg HS.  still lacks interest and motivation.  Can follow a TV show if interested. Plan: Restart lorazepam 1 mg in the morning, 1 mg in the afternoon and 1 mg at night for anxiety Stop hydroxyzine  Increase olanzapine to 1 and 1/2 of 10 mg tablets in evening  09/05/2020 appt noted: seen with parents today at his request Made med changes noted and tolerated the changes Better than I did last week. Now only fleeting HI/SI and "no where near what it was".  Sleeping better.  Still not a  lot of energy.  Anhedonia.  Less agitation.  A lot of anxiety all the time but it's helping.  Would like more energy and motivation.   Doesn't handle stress well. Parents note he's less shakey.  He's still staying with his parents.  Only driven once since this happened and F wants him to be able to drive and function more independently.  M agrees he's better.  Memory is better but not normal.  Primarily STM problems No akathisia with olanzapine this time.    Administering his own meds but mo watched. Slept 8 hours last night. Plan consider further reductions in quetiapine  10/17/20 appt noted: Stayed on Seroquel 600 HS and olanzapine 15. Olanzapine seemed to do most for the sleep.  Also helping eliminated HI/SI for the most part.  Wants to try to increase it.  Depression is much better with less rumination also.  Fears akathisia at higher dose as in the past. Also on fluoxetine 40.   Plan: Reduce Seroquel to 1 and 1/2 tablets at night Increase olanzapine to 1 of the 15 mg tablet and 1/2 of the 5 mg tablet for 1 week,  Then, if tolerated increase the olanzapine to 20 mg or 1 of the 15 mg tablets and 1 of the 5 mg tablets. If possible then also reduce quetiapine to 1 tablet at night.  11/05/2020 appointment with the following noted: Up to olanzapine 20 mg hs for 3 days.  Reduced Serooquel to 450 mg HS.  Didn't try to go lower. Had hiatal hernia and umbilical hernia surgery recently with no problem.   No akathisia so far. Sleep ok with meds so far changes.  No mood changes yet but overall likes the smoothness of olanzapine and helping sleep without knociing him out. Still some occ HI/SI, he thinks bc of anxiety generally.  Easily anxious. Plan: Continue olanzapine 20 mg nightly longer to give it time to help mood sx reduce quetiapine to 1 tablet at night to minimize polypharmacy and reduce risk of akathisia.  01/07/21 appt noted: Able to reduce quetiapine 300 mg HS ok with decent sleep but still  wakes a lot.  No AM hangover.  Don't have a lot to do.  Can enjoy going out and doing things.  But doesn't do it longterm.  Family is doing good.  Father started year 90 at his job.   Tolerating the meds well.  May wake more with quetiapine reduction. Lost 10#. Wonders if could try other meds he didn't tolerate before bc now can tolerate olanzapine and couldn't tolerate it in the past DT akathisia. Mood never great but can swing into depresssion without reason.   Plan: Continue olanzapine 20 mg nightly longer to give it time to help mood sx reduce quetiapine to 1/2 of the 300 mg  tablet at night to minimize polypharmacy and reduce risk of akathisia.  02/02/2021 appointment with the following noted: Tried reduced quetiapine to 150 mg and after a week had withdrawal sx including jittery and SI and he increased to 250 mg daily.  Felt better after increasing to 300 mg daily and last week reduced to 250 mg daily.  After increase quetiapine felt better in a few days.  Seems more alert with less quetiapine but can't tolerate a quick withdrawal.   Some awakening.  Plan: Reduce quetiapine by 50 mg every 2-4 weeks.  03/09/2021 phone call: Pt called and said that he is weaning off the seroquel. He was down to 200 mg of the seoquel and he started having withdrawal symptoms and sucidal thoughts. He went back up to 250 mg because he knew he was good on that.    Pt stated he starting back taking 250 mg on Saturday due to the symptoms he was having.Now he is no longer having suicidal thoughts but still very jittery. MD: He may just need to taper slower than most people.  If he can tolerate the 250 mg Seroquel with the jitteriness it should get better over the next 1 to 2 weeks.  Then he can try going down again by 50 mg at a time.  He probably needs to go down by just 50 mg every 2 to 4 weeks and wait till he fully adjusts to each reduction before he reduces again.  03/17/21 appt noted: Hernia surgery 2 weeks  ago. Seroquel 250 mg for a month then 200 mg daily and had SI and increased again to 300 mg daily.  Would love to get off it.  Withdrawal triggers intrusive HI/SI but otherwise doesn't have them No SE. Sleep is OK but not enough REM sleep.  Satisfied with other meds.   No current HI/SI.  No paranoia.  Still has anxiety and taking Ativan every 6 hours.  It works but doesn't cure it. Chronic anxiety and ask about ketamine for chronic depression. Plan: Because he had WD reducing from 250 to 200 mg daily will have to go slowly. Reduce quetiapine by 25 mg every 2-4 weeks. Reduce Seroquel to 250 mg for 2 weeks, Then reduce to 225 mg for 2 weeks, Then reduce to 200 mg for 2 weeks, Then reduce to 175 mg for 2 weeks, Then reduce to 150 mg Also: Buspirone 30 mg tablets for anxiety Start 1/3 tablet twice daily for 1 week Then increase to two thirds twice daily for 1 week Then increase to 1 tablet twice daily  04/15/2021 appointment with the following noted: Reduced Seroquel to 200 mg HS and increased buspirone to 30 mg BID STM px and wants to try to reduce Ativan to see if it is better.  But fears with drawal sx. Parents complain about his memory.  Less HI and SI since here. Less depressed. Plan: Buspirone 30 mg tablets for anxiety Start 1/3 tablet twice daily for 1 week Then increase to two thirds twice daily for 1 week Then increase to 1 tablet twice daily Continue olanzapine 20 mg nightly longer to give it time to help mood sx Because he had WD reducing from 250 to 200 mg daily will have to go slowly. Reduce quetiapine by 25 mg every 2-4 weeks. Reduce Seroquel to 250 mg for 2 weeks, Then reduce to 225 mg for 2 weeks, Then reduce to 200 mg for 2 weeks, Then reduce to 175 mg for 2 weeks, Then reduce to 150 mg Trial taper lorazepam: In hopes of improving short-term memory  04/30/2022 phone call: Complaining of withdrawal symptoms reducing lorazepam from 0.5 mg 5 times a day to 0.5 mg 4  times daily.  05/21/2021 appointment with the following noted: Gotten onto buspar 30 BID and down on lorazepam to 0.5 mg QID. Gotten down to Seroquel 200 mg HS A little restless every now and then but able to reduce lorazepam for the last weeks. Is too inactive and lays in bed too much watching TV.   Can' t tell effect of buspirone bc of other changes.  Sleep with awakening with 6-7 hours. Fleeting SI without trigger or plan or intent. Depressed more than anxious. Enjoys watching stocks and investing. Plan: Trial taper lorazepam gradually over time In hopes of improving short-term memory But for now continue lorazepam 0.5 mg QID Start clonidine 0.1 mg tablets one half at night for 3 nights, then 1/2 tablet twice daily for 3 nights, then one half in the morning and 1 tablet at night If clonidine does not noticeably help anxiety call the office  05/27/2021 phone call initially complaining of homicidal and suicidal thoughts after taking 1 mg of clonidine.  He was told to stop the medication.  But then called back stating he was having homicidal and suicidal thoughts from withdrawal from the medication which was not logical.  He agreed it was not logical and stated on 05/29/2021 that he felt better  06/23/2021 appointment the following noted: No clonidine. Sunday panic attack eating out and then having SI.  Stayed with father to feel safer. Intrusive thoughts of hurting parents or others.  HI only when has SI.  Usually panic without SI.  Then had intrusive thoughts about hurting others.   Usually intrusive thoughts are brief and fleeting.    07/20/21  appt noted; More anxious with less lithium.  More anxious in his body.  Not more depressed or irritable.  Sleep is the same.  More racing thoughts.  Chronic SI probably a litte worse but not unmanageable. Rduced lithium last week to 300 mg daily. No recent change in lisinopril. Off the clonidine for a week or so. Doesn't drink much water.  Drinks a  lot of diet coke. No marked akathisia or RLS but does shake his leg DT anxiety. Worries about needing mor emedicine. Plan: Increase olanzapine to 25 mg nightly bc more racing thoughts and anxiety with less lithium DT severity of sx and difficulty getting off Seroquel conside rincrease.  07/30/21 TC : CO racing SI today and wonders about whether increased sx racing thoughts, hyperactivity, SI related to Reduced lithium from 600 mg daily to 300 mg daily recently DT lithium level 1.8.  could be related.    Plan: increase olanzapine 30 mg pm Today take lithium 600 mg now.  Tomorrow start lithium CR  450 mg daily Continue olanzapine 30 mg PM until SI resolves unless akathisia returns.  Meredith Staggers, MD, DFAPA  08/17/21 appt noted:  No akathisia with olanzapine 30 mg pm (about 3 weeks) and is sleeping better. Increased lithium to 450 mg daily. Tolerating meds. Still racing negative thoughts maybe some better.  Asked about it. SI come and go.   Anxiety about the same as depression.  No motivation unless has to do something. Plan: Reduce  fluoxetine 20 mg 1 daily in hopes racing thoughts and anxiety are better.  09/14/2021 appointment with the following noted: No difference noted with reduction fluoxetine 20 mg daily. Most of the time negative thoughts, ex "what if I get in a wreck?"  Frequent negative thoughts.   Still on lorazepam 0.5 mg TID.   Wonders about trying prior meds that caused akathisia bc no longer gets it with olanzapine 30 mg daily. Sleep is usually ok.  Taking Seroquel 200 mg HS.  Thinks the olanzapine helps his sleep and doesn't want to stop it. Panic 1-2 times per month.  Then takes extra lorazepam. Plan: DC fluoxetine 20 mg 1 daily  Trial Auvelity off label for depression 1 in the AM for 1 week then 1 in AM and 1 with evening meal  10/15/2021 appointment with the following noted: Occ of getting confused as to day and went to church on a Thursday my mistake.   Had it happen a  few months ago too.  Memory is terrible. Thinks he couldn't tolerate more Auvelity. Not a lot of benefit from [place] 1 daily. Still quite a bit of anxiety.   A little drowsy  and sleeps 7-8 hours at night. Word finding problems.  Lost wallet. Plan: DC  Auvelity  For [facility]: memantine 1/2 tablet in the AM for 1 week, then 1/2 tablet twice daily, then 1/2 tablet in the AM and 1 tablet at night for 1 week then 1 tablet twice daily  10/22/2021 phone call complaining of additional stress asking to start new medication because of anxiety and more suicidal thoughts without intent or plan.  Reports taking more than 4 prescribed Ativan a day recently.  Asking to increase olanzapine which is already at 30 mg daily. MD response:We cannot go any higher in the dosage of olanzapine that he is currently taking.  A lot of these suicidal thoughts are apparently anxiety driven.  Let us have him resume taking paroxetine which is one of the more effective medicines for anxiety.  I will send it in and the instructions will be paroxetine 20 mg tablets 1/2 tablet daily for 1 week then 1 tablet daily  11/16/21 appt noted: Multiple phone calls since she was here.  Complaining of suicidal thoughts from memantine which was stopped. Started paroxetine and up to 20 mg daily.  Continues olanzapine 30 mg, lithium 450 mg nightly, lorazepam 0.5 mg 4 times daily , And quetiapine 200 mg nightly. Questions about whether memantine caused the neg SI really bc has them at times. When has SI often anxiety driven.  Will tend to ruminate on past relationship ended.   Edgy and irritable all the time but memantine seemed to make it worse. Mind is getting better with less SI lately.   No SE with paroxetine.   Aggrivated by forgetfulness.  eXample, talked with someone about going to breakfast last night and then forgot to go this mornin.g. Plan prescribed donepezil 5 mg daily for cognitive complaints specifically forgetfulness  12/31/2021  appointment with the  following noted: He had been prescribed donepezil 5 mg for cognitive complaints.  He called back reporting he felt it was causing homicidal thoughts and was instructed to stop it.  He has had these types of thoughts in the past.  He had no desire to act on them. Discouraged. Ongoing depression and reduced enjoyment and negative thoughts.  Can enjoy some things. A lot of anxiety chronically.  Trying to limit Ativan to 5 of 0.5 mg daily.  Sometimes needs 2. Constipation for years.  02/03/22 appt noted: Depression makes LBP worse. Ongoing chronic depression. Tolerated rivastigmine without triggering SI Chronic anxiety and Ativan helps some. Occ panic but not usual.  Asks if any other meds coud be used. Plan: But for now continue lorazepam 0.5 mg QID, but use LED for cog reasons Increase rivastigmine to 3 mg capsule 1 twice daily or 2 of the 1.5 mg capsules twice daily.  Call if you have problems with nausea Start dextromethorphan 1 capsule twice daily . Thi sis off label for depression based on MOA of Auvelity and use of paroxetine to prolong half life of DM. Presence of paroxetine may have been reason he didn't tolerate Auvelity brief trial before.  Will proceed slowly.  03/17/2022 appointment noted: Current psych meds: Rivastigmine 3 mg twice daily, quetiapine 200 mg nightly, paroxetine 40 mg daily, olanzapine 30 mg nightly, Namenda 10 mg daily, lorazepam 0.5 mg 4 times daily, lithium CR 450 mg nightly, dextromethorphan 15 mg twice daily A lot of anxiety since here.  Some days taken more lorazepam to 6-7 tablets daily.  No major triggers but anxious even about coming here and other normal things.  Cancelled trip to collect rocks with friend and cancelled the trip bc anxious.  Usually if can go he is ok.  Lots of SI ongoing Memory might be a little better. Asks how he responded to [employer].  Will check paper chart.  03/24/22 TC:  CO SI worse and restilless with Rivastigmine 9  mg . MD response:  Stop the rivastigmine since the [place] appears to be having side effects from it.  He was having anxiety at the last appointment 2 and then we increased the dose.  This might be causing the anxiety to be worse. He has chronic suicidal thoughts without intent or plan.  However it appears the thoughts are more intense with the rivastigmine     05/06/21 appt noted: Stopped rivastigmine DT anxiety and worsening SI.  Was better the next day. This week had some SI.  Thinking about the prior relationship tends to lead to SI. Will take Ativan when gets SI and it helps a little at 0.5 mg at a time. Plan: Consider fluvoxamine.  Yes this may help with obsessions on old GF and he failed other optioons. Start fluvoxamine 1/2 of 50 mg tablet and reduce paroxetine to 1 and 1/2 of 20 mg tablets for 5 days, Then increase fluvoxamine to 1 tablet daily and reduce paroxetine to 1 tablet daily for 5 days, Then increase fluvoxamine to 1-1/2 tablets daily and reduce paroxetine to 1/2 tablet daily for 5 days, Then increase fluvoxamine to 2 tablets daily and stop paroxetine   06/07/22 appt noted: Feels like the fluvoxamine 50 BID is too much.  On this a couple of weeks. Worries he might be worse with this with regard to SI.    He thinks it's worse with increase.   New neighbor who has a young boy. He worries that Reginal Lutes is triggering depression  and intrusive SI/HI a couple of days later and lasts a little while. Has had anxiety turning into panic attacks.  This gets associated with depression and SI/HI.  These thoughts are intrusive and he doesn't want to act on them.  Can have HI even towards family with whom he has no negative emotions.  Little things like a bump up in parking lot sets off his depression and anxiety and doesn't seem to handle things.   No sig akathisia.  In the past it's been continuous.   Awakens every 2-3 hours.  Usually goes back to sleep with meds. Has not had alcohol in 20 years  and had some intrusive thoughts without drinking.   Plan: As soon as he feels comfortable increase fluvoxamine to 50 mg AM and 100 mg PM for intrusive thoughts of HI/SI and obs on GF  07/06/22 appt noted: Increased fluvoxamine to 150 mg daily and felt more agitated and some SI so reduced to 100 mg 4 days ago and feels better so far.  Likes the fact it is good for anxiety and obs thoughts.  Wonders if there is a similar med.  Frustrated he can't get relief.  Some improvement in obs on exGF but not resolved. Continue other meds: lithium CR 450 mg HS, lorazepam 0.5 mg QID, Memantine 10 BID, olanzapine 30 mg HS, quetiapine 200 mg HS Tingling and burning in legs at night but doesn't interfere with sleep. Got insurance to pay for [facility] and lost 13#.   No full panic lately. But some anxiety waves.   Still major problems with memory. Plan: Wean fluvoxamine 50 mg daily for a month then 1/2 daily for 2 weeks then stop it DT  NR.  Watch for more anxiety.  07/28/22 TC: Franchot Erichsen, CMA  to Me     07/28/22  4:16 PM Note Mom called reporting patient has been confused, dizzy, and sluggish for the last 3 days. He is now staying with her. She had several questions in regards to this:   Lithium level - she feels he needs one done, last result in Epic was 08/2021 that I see. She would like order sent to [facility] for tomorrow.   Patient is on Wegovy, has been for about a month, and she wonders if this could cause sx.   Patient has meclizine and she is questioning if she should give that for his dizziness considering other sx.   She questions if perhaps he got up during the night and took additional medications, though doesn't know if this happened and if so what he took.    On 3/5 visit he was to wean fluvoxamine to 50 mg qd for a month and then 1/2 tab for 2 weeks and then stop. Mom and pt unsure if he started the wean.    He has F/U 4/4.        Me  to Franchot Erichsen, CMA   CC   07/28/22  5:44  PM Note Yes get lithium level in the AM and don't take any lithium tonight or in the morning before the blood test.     Reginal Lutes should not cause these sx except if he is losing wt quickly or is dehydrated it can cause lithium toxicity and include these sx.  He needs to drink lots of water.   He should go ahead and wean the fluvoxamine as instructed in the last appt.   If the sx get worse, go to the ER.  Me   CC   07/29/22  6:49 PM Note RTC   Sx not as severe as when he had to go to hospital in the past.  Albert [name] got Lithium level today.   Speech is not good .  Confused at times, but better today.  A little tremor .  A little balance problems.  No falls.  No diarrhea Took lihtium 450 last night. Mo says he got up in the middle of the night and moved things around last night and he doesn't rmember.   No change in night time meds.   Gradually reducing fluvoxamine.   No extra lorazepam.     Spoke with mother who verifies the same.   No med changes but push fluids.   Meredith Staggers, MD, DFAPA      08/05/22 appt : with parents Pt hosp 08/02/22 with acute kidney injury and somewhat elevated lithium level with some confusion.  Spoke with psych NP at hosp and lithium was decreased to 450 mg HS. No pending appt known with medical doctors about kidney function. Don't feel good and having HI and SI and feels he needs lithium for these issues.  Doesn't feel he should have been discharged.   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 No added meds at night but has been confused at night and getting up multiple times and doing things he doesn't remember.  This concerns parents.  Parents been up at night with him and he doesn't make sense.  Still groggy in the am per mo for an hour.   Plan: Plan: Get lithium level and kidney test ASAP Stop fluvoxamine Reduce lithium 300 mg capsule 1 at night Monday night: reduce to olanzapine to 20 mg at night (stop the 10 mg tablet) and start clozapine prescription. Monday  4/15 : increase clozapine to 100 mg at night and reduce quetiapine to 1/2 of 200 mg tablet and continue olanzapine 20 mg each evening.  08/01/22 & 08/11/22 ED for HI and SI  08/12/22 TC mother:  Mother, Patsy called. Per note made by Larinda Buttery. He was referred to [facility].Reporting that he did go up there last night. This morning they called her and told her to come pick him up. They told her he wasn't having homicidal or suicidal thoughts and it would be four to five days of him waiting to go to a facility in [place]. They recommended him for PHP to start 4/16. She picked him up today. He is worried about these thoughts coming back up. Also, [facility] came out yesterday to assess him and offered counseling from [facility]. He wants to do counseling with them.  Any input on care he can receive?  He is so discouraged and wants to be safe.  (She notes that the [facility] gave him- Zyprexa 10mg  at 1:07am and again at 1:25am per the med list- based on his previous med list) He was supposed to be off of Zyprexa10mg . Has been on  Clozapine since Monday. He did go ahead and take it before he left ( 2pills) so he hasn't missed a dose and did go get labwork done. Is wanting to continue the Clozapine, please check the labwork to be able to fill the 100mg  dose.    MD resp:  Continue all meds as RX except go ahead and increase clozapine to 100 mg tablets, 1 each night as soon as he can get the 100 mg tablets from pharmacy.  RX sent.  Trying to speed up his chance of  response by getting it to a hgiher dose.  Clozapine is very effective for HI/SI.  If he gets worsening HI/SI in the interim it will not be caused by meds but just the natural waxing and waining of his sx.  Once clozapine dose gets high enough it should help but avg patient needs 300 mg so we have to just be patient.      08/20/22 appt:  Up to 100 mg clozapine HS SE constipation and using Colace, Miralax.  Been to [facility] twice  since here.   Ongoing continuous HI/SI.   All I  want to do is go to sleep to escape.  Dep causing the thoughts.   Taking Ativan 0.5 mg BID-QID.  He thinks mother not giving him enough of it.  He thinks anxiety triggers HI/SI.  He fears HI and hopes he's strong enough to reisist.  Not angry or wanting to hurt anyone in reality but has the intrusive thoughts. Conc and memory still not good.   Sleep not much different.   Staying with parents currently. Dep gotten worse M worrying he's dependent on lorazepam.   Confusion at night is no longer happening.  Not wandering at night.  Plan: For severe constipation start Linzess 145 mg AM Get lithium level and kidney test ASAP Stopped fluvoxamine and confusion at night is gone  lithium 300 mg capsule 1 at night  olanzapine to 20 mg at night  increase clozapine to 150 mg at night for 3 nights then 200 mg HS   olanzapine 20 mg each evening. Continue lorazepam 0.5 mg QID, needs this and informed mother  09/07/22 appt noted: Psych meds: clozapine 250 MG HS, lithium 300 mg HS, lorazepam 0.5 mg BID-QID, olanzapine 20 .  Off Seroquel, linzess 290 mg daily, Miralax, Colace. Sleep 7-8 hours without change from before clozapine.   Mood is still very depressed and still gets anxiety. Still problems with constipation.   Dep is so much it is causing intrusive HI/SI.   No more bizarre behaviors at night.   Plan: Plan: Get lithium level and kidney test ASAP  lithium 300 mg capsule 1 at night  olanzapine to 20 mg at night  increase clozapine to  300 mg HS as soon as tolerated and then 400 mg HS.  olanzapine 20 mg each evening. Continue lorazepam 0.5 mg QID, needs this and informed mother  10/07/22 appt noted: alone and with parents Rough month.  Altered voice for a couple of weeks or so..  Mind not good.  Feel out of sorts.  Jittery.  Constipation.  No appetite.  On Wegovy.  Saw GI doc.   May need to reduce Wegovy bc poor appetite and constipation.  Lost 25 #. Current psych med: clozapine 400 mg HS about a  week or 2, olanzapine 20 mg HS, lithium 300 mg HS, lorazepam 0.5 mg QID. Reduced intrusive thoughts.  2 nights since here night eating.   Sleep 6 hours.  Limited napping during the day.   Anhedonia.  Lower motivation.  Tremor worse. M says he sleeps pretty well but awakens  BP is under control. Plan Reduce lithium to 150 mg capsule 1 at night DT level higher Reduce  olanzapine to 10 mg at night  Continue clozapine 400 mg HS bc helping SI and HI and  Continue lorazepam 0.5 mg QID, needs this and informed mother  11/08/22 appt noted:  alone and with parents. Ongoing calls between visits Doesn't think clozapine helping him sleep as  well as olanzapine for sleep or any other benefit. While at restaurant had intrusive HI/SI. Staying with parents seems to provoke intrusive HI.  Is grateful to them.  Sat woke up with them.  Sometimes goes away and sometimes not.  No know trigger and no intent. Saw PCP.  Off Wegovy.  Started metformin. No effect noted from clozapine.   M limits his lorazepam.  PCP referring him to neuro to FU abnormal head scan.  He wonders if his speech and memory problems are related.    Psych med hx extensive including ECT and  risperidone, Zyprexa 30 Latuda 80 which caused akathisia,  Vraylar, Rexulti, aripiprazole 20 mg with akathisia,  Seroquel 1000 mg,  InVega, Geodon, Saphris with side effects, symbyax, Fanapt NR .  Caplyta SE and markedly worse. Clozapine started end of March lithium 1200 SE,  lamotrigine 300 mg, Depakote 2000 mg, Tegretol, Trileptal and several of these in combinations, gabapentin,  N-acetylcysteine, Nuedexta,     Belsomra with no response,  Lunesta no response, trazodone 200 mg,  Xanax, clonazepam, lorazepam less sedation. Buspirone NR Clonidine SE with 2 trials  Viibryd 40 mg for 3 months with diarrhea,  protriptyline with side effects,  Trintellix 20 mg,  Parnate 50 mg with no response,   Emsam 12 mg for 2 months,   imipramine,  venlafaxine,   bupropion was side effects,  Lexapro 20 mg, sertraline, paroxetine, Deplin, fluoxetine 80,  fluvoxamine 150 agitated.  Confusion when with clozapine. Auvelity NR at one daily.  SE BID  methylphenidate 60 mg,  Vyvanse, Concerta, strattera, , modafinil,  Memantine worsening SI? Donepezil, complained of HI, Exelon 3 BID rivastigmine DT worsening SI  pramipexole,  amantadine ,  Patient prone to akathisia.  [facility]   Review of Systems:  Review of Systems  Constitutional:  Positive for appetite change and fatigue.  Respiratory:  Negative for shortness of breath.   Cardiovascular:  Negative for chest pain and palpitations.  Musculoskeletal:  Positive for back pain.  Neurological:  Positive for dizziness and tremors.       Fidgety  Psychiatric/Behavioral:  Positive for confusion, decreased concentration and suicidal ideas. Negative for agitation, behavioral problems, dysphoric mood, hallucinations, self-injury and sleep disturbance. The patient is nervous/anxious. The patient is not hyperactive.     Medications: I have reviewed the patient's current medications.  Current Outpatient Medications  Medication Sig Dispense Refill   acetaminophen (TYLENOL) 500 MG tablet Take 1,000 mg by mouth every 6 (six) hours as needed for moderate pain.     bisacodyl (DULCOLAX) 10 MG suppository Place 1 suppository (10 mg total) rectally as needed for moderate constipation. 12 suppository 0   clozapine (CLOZARIL) 200 MG tablet Take 2 tablets (400 mg total) by mouth at bedtime. 14 tablet 1   esomeprazole (NEXIUM) 40 MG capsule Take 40 mg by mouth daily.     fenofibrate (TRICOR) 145 MG tablet Take 145 mg by mouth daily.     fluticasone (FLONASE) 50 MCG/ACT nasal spray Place 1 spray into both nostrils daily as needed for allergies or rhinitis.     hydrocortisone (ANUSOL-HC) 2.5 % rectal cream Place 1 application rectally 3 (three) times daily as needed for hemorrhoids.      lactulose (CHRONULAC) 10 GM/15ML solution Take 22.5-45 mLs (15-30 g total) by mouth 2 (two) times daily. 473 mL 0   levothyroxine (SYNTHROID, LEVOTHROID) 75 MCG tablet Take 75 mcg by mouth daily before breakfast.     lisinopril (ZESTRIL) 5 MG  tablet Take 5 mg by mouth daily.     lithium carbonate 150 MG capsule TAKE 1 CAPSULE BY MOUTH AT BEDTIME 30 capsule 0   LORazepam (ATIVAN) 0.5 MG tablet Take 1 tablet (0.5 mg total) by mouth 4 (four) times daily. 120 tablet 1   lubiprostone (AMITIZA) 24 MCG capsule Take 24 mcg by mouth daily with breakfast.     meclizine (ANTIVERT) 25 MG tablet Take 1 tablet (25 mg total) by mouth 3 (three) times daily as needed for dizziness. 30 tablet 1   OLANZapine (ZYPREXA) 10 MG tablet TAKE ONE TABLET BY MOUTH AT BEDTIME 30 tablet 0   polycarbophil (FIBERCON) 625 MG tablet Take 1,250 mg by mouth daily.     sildenafil (VIAGRA) 100 MG tablet Take 1 tablet (100 mg total) by mouth daily as needed for erectile dysfunction. 30 tablet 6   tamsulosin (FLOMAX) 0.4 MG CAPS capsule Take 1 capsule (0.4 mg total) by mouth daily. 90 capsule 3   WEGOVY 1 MG/0.5ML SOAJ Inject 1 mg into the skin once a week.     No current facility-administered medications for this visit.   Medication Side Effects: Other: mild sleepiness.   Occ twitches.\, tremor  Allergies:  Allergies  Allergen Reactions   Meloxicam Other (See Comments)    Dizziness   Nsaids Other (See Comments)    Hallucinations   Ibuprofen Other (See Comments)    Can not take because taking lithium   Prednisone Other (See Comments)    Can't sleep    Wellbutrin [Bupropion] Other (See Comments)    Suicidal thoughts    Past Medical History:  Diagnosis Date   ADHD (attention deficit hyperactivity disorder)    Anemia    Anxiety    Bipolar disorder (HCC)    BPH (benign prostatic hyperplasia)    Chronic kidney disease, stage 3b (HCC)    Constipation    DDD (degenerative disc disease), cervical    Depression     Deviated septum    ED (erectile dysfunction)    GERD (gastroesophageal reflux disease)    Graves disease    History of hiatal hernia    Hypertension    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    Hypothyroidism    Pneumonia    PONV (postoperative nausea and vomiting)     Family History  Problem Relation Age of Onset   Prostate cancer Neg Hx    Bladder Cancer Neg Hx    Kidney cancer Neg Hx     Social History   Socioeconomic History   Marital status: Single    Spouse name: Not on file   Number of children: Not on file   Years of education: Not on file   Highest education level: Not on file  Occupational History   Not on file  Tobacco Use   Smoking status: Former    Types: Cigarettes    Passive exposure: Past   Smokeless tobacco: Former    Types: Associate Professor Use: Never used  Substance and Sexual Activity   Alcohol use: Not Currently   Drug use: Never   Sexual activity: Yes  Other Topics Concern   Not on file  Social History Narrative   Not on file   Social Determinants of Health   Financial Resource Strain: Not on file  Food Insecurity: Not on file  Transportation Needs: Not on file  Physical Activity: Not on file  Stress: Not on file  Social  Connections: Not on file  Intimate Partner Violence: Not on file    Past Medical History, Surgical history, Social history, and Family history were reviewed and updated as appropriate.   Please see review of systems for further details on the patient's review from today.   Objective:   Physical Exam:  There were no vitals taken for this visit.  Physical Exam Constitutional:      General: He is not in acute distress.    Appearance: He is well-developed.  Musculoskeletal:        General: No deformity.  Neurological:     Mental Status: He is alert and oriented to person, place, and time.     Cranial Nerves: No dysarthria.     Motor: Tremor present.     Coordination:  Coordination normal.     Comments: mild tremor  Psychiatric:        Attention and Perception: Attention and perception normal. He does not perceive auditory or visual hallucinations.        Mood and Affect: Mood is anxious and depressed. Affect is not labile, blunt, angry or tearful.        Speech: Speech normal. Speech is not slurred.        Behavior: Behavior normal. Behavior is not slowed. Behavior is cooperative.        Thought Content: Thought content is not delusional. Thought content does not include homicidal or suicidal ideation. Thought content does not include suicidal plan.        Cognition and Memory: Cognition normal. He exhibits impaired recent memory.        Judgment: Judgment normal.     Comments: Insight fair Chronically talkative .directable. But chronically mildly pressured. He is chronically anxious   No manic signs noted. Some wordfinding problems ongoing. Intrusive thoughts better with clozapine Fidgety today but not uncomfortable Alert and oriented.      Lab Review:     Component Value Date/Time   NA 135 10/20/2022 1123   NA 142 03/20/2014 0514   K 5.2 10/20/2022 1123   K 4.1 03/20/2014 0514   CL 101 10/20/2022 1123   CL 112 (H) 03/20/2014 0514   CO2 20 10/20/2022 1123   CO2 26 03/20/2014 0514   GLUCOSE 150 (H) 10/20/2022 1123   GLUCOSE 101 (H) 08/12/2022 0029   GLUCOSE 99 03/20/2014 0514   BUN 22 10/20/2022 1123   BUN 7 03/20/2014 0514   CREATININE 1.68 (H) 10/20/2022 1123   CREATININE 0.78 03/20/2014 0514   CALCIUM 9.8 10/20/2022 1123   CALCIUM 9.5 03/20/2014 0514   PROT 6.7 08/12/2022 0029   PROT 7.3 03/18/2014 1459   ALBUMIN 4.2 08/12/2022 0029   ALBUMIN 3.2 (L) 03/18/2014 1459   AST 18 08/12/2022 0029   AST 16 03/18/2014 1459   ALT 14 08/12/2022 0029   ALT 16 03/18/2014 1459   ALKPHOS 24 (L) 08/12/2022 0029   ALKPHOS 105 03/18/2014 1459   BILITOT 0.5 08/12/2022 0029   BILITOT 0.3 03/18/2014 1459   GFRNONAA 42 (L) 08/12/2022 0029    GFRNONAA >60 03/20/2014 0514   GFRAA 54 (L) 04/18/2019 1612   GFRAA >60 03/20/2014 0514       Component Value Date/Time   WBC 6.2 11/03/2022 1107   WBC 6.8 08/12/2022 0029   RBC 3.77 (L) 11/03/2022 1107   RBC 3.72 (L) 08/12/2022 0029   HGB 11.7 (L) 11/03/2022 1107   HCT 34.0 (L) 11/03/2022 1107   PLT 612 (H) 11/03/2022 1107  MCV 90 11/03/2022 1107   MCV 89 03/20/2014 0514   MCH 31.0 11/03/2022 1107   MCH 31.5 08/12/2022 0029   MCHC 34.4 11/03/2022 1107   MCHC 33.4 08/12/2022 0029   RDW 11.7 11/03/2022 1107   RDW 12.5 03/20/2014 0514   LYMPHSABS 1.3 11/03/2022 1107   LYMPHSABS 3.2 03/20/2014 0514   MONOABS 0.5 08/12/2022 0029   MONOABS 1.0 03/20/2014 0514   EOSABS 0.1 11/03/2022 1107   EOSABS 0.4 03/20/2014 0514   BASOSABS 0.0 11/03/2022 1107   BASOSABS 0.1 03/20/2014 0514    Lithium Lvl  Date Value Ref Range Status  10/20/2022 0.4 (L) 0.5 - 1.2 mmol/L Final    Comment:    A concentration of 0.5-0.8 mmol/L is advised for long-term use; concentrations of up to 1.2 mmol/L may be necessary during acute treatment.                                  Detection Limit = 0.1                           <0.1 indicates None Detected    08/12/21 lithium level 0.9 on 450 mg.  lithium level Sept 0.8.   lithium level July 27, 2018 was normal at 1.0.   Lithium level [facility] October 03, 2018 = 1.2. Said he got lithium level at [facility] as requested.  Labs not in Epic.  Recent lipids ok except higher TG than usual.  Normal A1C.   .res Assessment: Plan:    Wirt was seen today for follow-up, depression and anxiety.  Diagnoses and all orders for this visit:  Severe bipolar I disorder, current or most recent episode depressed (HCC)  GAD (generalized anxiety disorder)  Panic disorder with agoraphobia  Drug-induced constipation  Insomnia due to mental condition  Mild cognitive impairment  45 min appt with pt  needs a lot of time. Chronic TR bipolar mixed and chronic anxiety.  He  usually has mixed bipolar symptoms which we have not been able to completely eliminate.  See long list of meds tried.   He is  chronically unstable  Chronic frequent ph calls between appts often about the same issue repeatedly.  Recent hosp 08/02/22 with SI and acute kidney injury and elevated lithium. Processed this and disc goal of trying to stop lithium but risk of worsening SI.  He has chronic SI and there's risk of this worsening off lithium.  Lithium and clozapine are the most effective meds for SI.  Possible that [facility] made him dehydrated and constipation and caused incr lithium level.  Had 15-20 # wt loss.  Discussed mother's concerns about his memory which is primarily a short-term memory issue.  We discussed the alternative options of using Namenda off label for mild cognitive impairment.  We are not yet able to reduce the dose of lorazepam but that could potentially help as well.  His memory was much worse on Xanax than lorazepam. We discussed the short-term risks associated with benzodiazepines including sedation and increased fall risk among others.  Discussed long-term side effect risk including dependence, potential withdrawal symptoms, and the potential eventual dose-related risk of dementia. Disc newer studies refute dementia risks.  Unfortunately due to the severity of set his symptoms polypharmacy is a necessity.  We are reducing meds to day to address.    Lithium being used bc chronic SI and death  thoughts but it is better with clozapine.   Counseled patient regarding potential benefits, risks, and side effects of lithium to include potential risk of lithium affecting thyroid and renal function.  Discussed need for periodic lab monitoring to determine drug level and to assess for potential adverse effects.  Counseled patient regarding signs and symptoms of lithium toxicity and advised that they notify office immediately or seek urgent medical attention if experiencing these signs and  symptoms.  Patient advised to contact office with any questions or concerns.  Will plan to stop bc of recent toxicity and SI less with clozapine. Reduced lithium to 150 mg daily Checked lithium level and BMP and level 1.1 on 300 mg Daily so reduced again to 150 mg daily and level 0.4 Call if death thoughts worsen or worsening SI.   Disc SE. Disc combo with Seroquel may lower response rate but he doesn't think he can sleep without Seroquel.  Disc in detail again clozapine.  Disc data showing less SI with it but he's had no changes.    Disc risk in detail including low WBC with complication, myocarditis.  Extensive discussion of CBC monitoring.   Disc this again bc taking Wegovy might help him to tolerate it better.  He's started [facility] and lost 25 #. Cont clozapine 400 mg HS Check clozapine level and then direct dosing.  Did he take duloxetine?  Might help depression more.  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  Discussed long-term side effect risk including dependence, potential withdrawal symptoms, and the potential eventual dose-related risk of dementia.  But recent studies from 2020 dispute this association between benzodiazepines and dementia risk. Newer studies in 2020 do not support an association with dementia.  Discussed safety plan at length with patient.  Advised patient to contact office with any worsening signs and symptoms.  Instructed patient to go to the [facility] emergency room for evaluation if experiencing any acute safety concerns, to include suicidal intent.  He commits to safety.  Disc Ozempic and Monjauro for weight loss.  Has had  weight gain from meds as a contributor. But lately has lost some weight.  Disc Dr. Pollie Meyer mentioned evidence it could help depression.  I rec he continue this also.  Unlikely to worsen SI.  He's making progress.  Needs to keep up activity as much as possible for depression.  For severe constipation seeing GI  Had constipation before clozapine and GI appt in June started Amitza which helped some. Needs to push fluids.    Answered questions about labs with chronic anemia and borderline high platelets.  FU 4 weeks   Meredith Staggers, MD, DFAPA  Future Appointments  Date Time Provider Department Center  12/09/2022  1:00 PM Cottle, Steva Ready., MD [facility] None  12/30/2022  2:30 PM Sondra Come, MD [facility] None  01/10/2023  1:00 PM Cottle, Steva Ready., MD [facility] None    No orders of the defined types were placed in this encounter.      -------------------------------

## 2022-11-10 ENCOUNTER — Other Ambulatory Visit: Payer: Self-pay | Admitting: Psychiatry

## 2022-11-11 LAB — CBC WITH DIFFERENTIAL/PLATELET
Basophils Absolute: 0.1 10*3/uL (ref 0.0–0.2)
Basos: 1 %
EOS (ABSOLUTE): 0.2 10*3/uL (ref 0.0–0.4)
Eos: 3 %
Hematocrit: 37.6 % (ref 37.5–51.0)
Hemoglobin: 12.1 g/dL — ABNORMAL LOW (ref 13.0–17.7)
Immature Grans (Abs): 0 10*3/uL (ref 0.0–0.1)
Immature Granulocytes: 0 %
Lymphocytes Absolute: 1.3 10*3/uL (ref 0.7–3.1)
Lymphs: 22 %
MCH: 31.1 pg (ref 26.6–33.0)
MCHC: 32.2 g/dL (ref 31.5–35.7)
MCV: 97 fL (ref 79–97)
Monocytes Absolute: 0.4 10*3/uL (ref 0.1–0.9)
Monocytes: 7 %
Neutrophils Absolute: 3.9 10*3/uL (ref 1.4–7.0)
Neutrophils: 67 %
Platelets: 565 10*3/uL — ABNORMAL HIGH (ref 150–450)
RBC: 3.89 x10E6/uL — ABNORMAL LOW (ref 4.14–5.80)
RDW: 12.4 % (ref 11.6–15.4)
WBC: 5.8 10*3/uL (ref 3.4–10.8)

## 2022-11-13 LAB — CLOZAPINE (CLOZARIL)
Clozapine Lvl: 733 ng/mL — ABNORMAL HIGH (ref 350–600)
NorClozapine: 294 ng/mL
Total(Cloz+Norcloz): 1027 ng/mL

## 2022-11-16 ENCOUNTER — Telehealth: Payer: Self-pay | Admitting: Psychiatry

## 2022-11-16 ENCOUNTER — Other Ambulatory Visit: Payer: Self-pay | Admitting: Psychiatry

## 2022-11-16 DIAGNOSIS — F314 Bipolar disorder, current episode depressed, severe, without psychotic features: Secondary | ICD-10-CM

## 2022-11-16 MED ORDER — CLOZAPINE 200 MG PO TABS
500.0000 mg | ORAL_TABLET | Freq: Every day | ORAL | 4 refills | Status: DC
Start: 2022-11-16 — End: 2022-12-07

## 2022-11-16 NOTE — Telephone Encounter (Signed)
I entered [facility].  Clozapine level ok but may benefit with increase to 500 mg nightly.  Tell him to increase with current supply.  I'll send in new RX

## 2022-11-16 NOTE — Telephone Encounter (Signed)
Ashraf wanting feedback from labs drawn on 7/10.  ANC 3.9 Clozapine level 733.

## 2022-11-16 NOTE — Telephone Encounter (Signed)
Patient notified

## 2022-11-16 NOTE — Telephone Encounter (Signed)
Next visit is 12/09/22. Albert [name] states he had his blood work done last week and hasn't received the results. Please call him re: this. His phone number is [phone].

## 2022-11-17 ENCOUNTER — Other Ambulatory Visit: Payer: Self-pay | Admitting: Psychiatry

## 2022-11-17 NOTE — Progress Notes (Signed)
Clozapine level 733 + norclozapine 294 = 1027 on 400 mg daily. Noted elsewhere increase clozapine to 500 mg HS

## 2022-11-18 LAB — CBC WITH DIFFERENTIAL/PLATELET
Basophils Absolute: 0.1 10*3/uL (ref 0.0–0.2)
Basos: 1 %
EOS (ABSOLUTE): 0.3 10*3/uL (ref 0.0–0.4)
Eos: 3 %
Hematocrit: 37.1 % — ABNORMAL LOW (ref 37.5–51.0)
Hemoglobin: 12.6 g/dL — ABNORMAL LOW (ref 13.0–17.7)
Immature Grans (Abs): 0 10*3/uL (ref 0.0–0.1)
Immature Granulocytes: 0 %
Lymphocytes Absolute: 1.5 10*3/uL (ref 0.7–3.1)
Lymphs: 21 %
MCH: 31 pg (ref 26.6–33.0)
MCHC: 34 g/dL (ref 31.5–35.7)
MCV: 91 fL (ref 79–97)
Monocytes Absolute: 0.4 10*3/uL (ref 0.1–0.9)
Monocytes: 6 %
Neutrophils Absolute: 5 10*3/uL (ref 1.4–7.0)
Neutrophils: 69 %
Platelets: 563 10*3/uL — ABNORMAL HIGH (ref 150–450)
RBC: 4.06 x10E6/uL — ABNORMAL LOW (ref 4.14–5.80)
RDW: 12.1 % (ref 11.6–15.4)
WBC: 7.3 10*3/uL (ref 3.4–10.8)

## 2022-11-22 ENCOUNTER — Other Ambulatory Visit: Payer: Self-pay | Admitting: Psychiatry

## 2022-11-22 DIAGNOSIS — Z79899 Other long term (current) drug therapy: Secondary | ICD-10-CM

## 2022-11-22 DIAGNOSIS — F314 Bipolar disorder, current episode depressed, severe, without psychotic features: Secondary | ICD-10-CM

## 2022-11-24 ENCOUNTER — Other Ambulatory Visit: Payer: Self-pay | Admitting: Psychiatry

## 2022-11-25 LAB — CBC WITH DIFFERENTIAL/PLATELET
Basophils Absolute: 0.1 10*3/uL (ref 0.0–0.2)
EOS (ABSOLUTE): 0.3 10*3/uL (ref 0.0–0.4)
Eos: 5 %
Hematocrit: 37 % — ABNORMAL LOW (ref 37.5–51.0)
Immature Grans (Abs): 0 10*3/uL (ref 0.0–0.1)
Immature Granulocytes: 0 %
Lymphs: 21 %
MCH: 30.8 pg (ref 26.6–33.0)
MCV: 93 fL (ref 79–97)
Monocytes Absolute: 0.4 10*3/uL (ref 0.1–0.9)
Neutrophils Absolute: 5.2 10*3/uL (ref 1.4–7.0)
Neutrophils: 67 %
Platelets: 508 10*3/uL — ABNORMAL HIGH (ref 150–450)
RBC: 3.99 x10E6/uL — ABNORMAL LOW (ref 4.14–5.80)
RDW: 12.2 % (ref 11.6–15.4)
WBC: 7.6 10*3/uL (ref 3.4–10.8)

## 2022-12-01 ENCOUNTER — Other Ambulatory Visit: Payer: Self-pay | Admitting: Psychiatry

## 2022-12-06 ENCOUNTER — Telehealth: Payer: Self-pay | Admitting: Psychiatry

## 2022-12-06 NOTE — Telephone Encounter (Signed)
I see him in 3 days.  He can increase the clozapine to 3 of the 200 mg tablets at night. I have discussed this repeatedly with him.  He is not really suicidal, he is having intrusive, obsessive thoughts about SI/HI bc he is afraid of them not because he is considering suicide or homocide.

## 2022-12-06 NOTE — Telephone Encounter (Signed)
Patient notified of recommendations. He reports he gets five 100-mg tablets nightly from pharmacy. Last Rx was sent for 200 mg 2.5 tablets, but pharmacy still filling as 100 mg. Told him to take 6 tablets tonight, which is the same dose that was recommended. He gets labs drawn Wednesday. York Spaniel he would be out of medication on Thursday based on the 5 tablets.

## 2022-12-06 NOTE — Telephone Encounter (Signed)
Patient said when he wakes up his mind won't stop running. He is reporting suicidal/homicidal ideation. I asked him if he was anxious, as we have discussed before when he is anxious he reports SI/HI. He is also reporting on getting 6-8 hours of sleep on Zyprexa 10 mg.

## 2022-12-06 NOTE — Telephone Encounter (Signed)
Albert [name] called.  He sleeps about 6-8 hrs, but wakes up and is having suicidal thoughts in the am. Lasts all day.

## 2022-12-07 ENCOUNTER — Other Ambulatory Visit: Payer: Self-pay | Admitting: Psychiatry

## 2022-12-07 DIAGNOSIS — F314 Bipolar disorder, current episode depressed, severe, without psychotic features: Secondary | ICD-10-CM

## 2022-12-07 MED ORDER — CLOZAPINE 100 MG PO TABS
600.0000 mg | ORAL_TABLET | Freq: Every day | ORAL | 3 refills | Status: DC
Start: 2022-12-07 — End: 2023-01-06

## 2022-12-07 NOTE — Telephone Encounter (Signed)
I sent new RX with updated info to the pharmacy.  Pharmacy should allow him to pick up extra early if needed

## 2022-12-07 NOTE — Telephone Encounter (Signed)
Patient notified of new Rx. Will touch base with pharmacy tomorrow.

## 2022-12-07 NOTE — Telephone Encounter (Signed)
Called pharmacy and asked about RF. Was told he had RF available, but if he continues on the increased dose of 600 mg he will need new Rx. They are only able to get 100 mg tablets.

## 2022-12-07 NOTE — Telephone Encounter (Signed)
The Clozapine was increased as instructed.Due to this he will be short come Thursday. Mother is inquiring about advisement for the lack of medication. She stated to inform Clarisse Gouge he is scheduled for labs on Wednesday.

## 2022-12-07 NOTE — Telephone Encounter (Signed)
Patient told me yesterday he will have labs drawn on Wednesday and he has an appt with Dr. Jennelle Human on Thursday.

## 2022-12-07 NOTE — Telephone Encounter (Signed)
Notified patient, as I did yesterday, that he has labs drawn on Wednesday and sees Dr. Jennelle Human on Thursday. He has medication thru Wednesday night and a new Rx can be sent to the pharmacy on Thursday, based on what is determined is needed. The pharmacy said they only get 100 mg tablets and patient has RF available for 5 a day. Patient is concerned he will run out.

## 2022-12-08 ENCOUNTER — Other Ambulatory Visit: Payer: Self-pay | Admitting: Psychiatry

## 2022-12-08 NOTE — Telephone Encounter (Signed)
Pharmacy has Rx for 600 mg, just need labs. Told them he had them today.

## 2022-12-09 ENCOUNTER — Telehealth (INDEPENDENT_AMBULATORY_CARE_PROVIDER_SITE_OTHER): Payer: Medicare PPO | Admitting: Psychiatry

## 2022-12-09 ENCOUNTER — Encounter: Payer: Self-pay | Admitting: Psychiatry

## 2022-12-09 DIAGNOSIS — Z79899 Other long term (current) drug therapy: Secondary | ICD-10-CM

## 2022-12-09 DIAGNOSIS — F411 Generalized anxiety disorder: Secondary | ICD-10-CM

## 2022-12-09 DIAGNOSIS — F4001 Agoraphobia with panic disorder: Secondary | ICD-10-CM

## 2022-12-09 DIAGNOSIS — K5903 Drug induced constipation: Secondary | ICD-10-CM

## 2022-12-09 DIAGNOSIS — F314 Bipolar disorder, current episode depressed, severe, without psychotic features: Secondary | ICD-10-CM

## 2022-12-09 DIAGNOSIS — G3184 Mild cognitive impairment, so stated: Secondary | ICD-10-CM

## 2022-12-09 DIAGNOSIS — F5105 Insomnia due to other mental disorder: Secondary | ICD-10-CM

## 2022-12-09 NOTE — Progress Notes (Signed)
Albert [name] [id number] [birth year] 57 y.o.   Video Visit via My Chart  I connected with pt by video using My Chart and verified that I am speaking with the correct person using two identifiers.   I discussed the limitations, risks, security and privacy concerns of performing an evaluation and management service by My Chart  and the availability of in person appointments. I also discussed with the patient that there may be a patient responsible charge related to this service. The patient expressed understanding and agreed to proceed.  I discussed the assessment and treatment plan with the patient. The patient was provided an opportunity to ask questions and all were answered. The patient agreed with the plan and demonstrated an understanding of the instructions.   The patient was advised to call back or seek an in-person evaluation if the symptoms worsen or if the condition fails to improve as anticipated.  I provided 30 minutes of video time during this encounter.  The patient was located at home and the provider was located office. Session 100 pm -145 pm  Subjective:   Patient ID:  Albert [name] is a 57 y.o. (DOB 08-24-1965) male.  Chief Complaint:  Chief Complaint  Patient presents with   Follow-up   Depression   Anxiety   Sleeping Problem   Medication Reaction    Depression        Associated symptoms include decreased concentration, fatigue, appetite change and suicidal ideas.  Past medical history includes anxiety.   Anxiety Symptoms include decreased concentration, dizziness, nervous/anxious behavior and suicidal ideas. Patient reports no chest pain, palpitations or shortness of breath.      Albert [name] presents to the office today for follow-up of mixed bipolar, anxiety and still grieving stress of breakup.  He requires frequent follow-up because of long-term symptoms.  At visit October 25, 2018.  We made several medicine changes because of his concerns about sleep and other  issues.  We increased olanzapine back to 7.5 mg bc not sleeping as well at 5 mg.  He has to have the prescription for quetiapine written for up to 3 tablets at night in case insomnia is worse because insomnia makes his mood disorder so much worse.  We discussed that was above the usual max but the request was granted given his treatment resistant status. We also reduce lithium from 900 mg daily to 750 mg daily to try to reduce tremor and muscle twitches.  At  visit March 28, 2019.  Fluoxetine was increased to 40 mg daily.   Early January 2021 increased to 60 mg daily.  No SE.  seen June 28, 2019.  No further meds were changed except olanzapine was increased back to 10 mg daily to see if if he could get additional benefit per his request.  Covid vaccinated.  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  Then had cholecystectomy.  April 2021 appt with the following noted: 2 episodes night sweats lately.  Memory has been very bad lately.  Repeatedly asks mother questions. Still  tend to stay in his room and his bed.  Still has rapid cycling mood swings.  Maybe some better with increase in olanzapine to 10 and tolerating itl.  Would like to get out more but can't DT Covid.  Can perform necessary chores.  Will get out of the house when he can.  No change in death thoughts and anxiety in intensity but is better with frequency.  Usually comes and goes in  waves but more persistent.  Consistent with meds.  Ativan not helping anxiety very dramatically but he's not sure.  Fidgety.  No trigger other than still grieving relationship and can't get it out of his head.  Poor energy, concentration and more forgetful. In bed more and less active.  Sleep ok lately which is unusual.  Can concentrate on financial matters and stock market at times.  Tolerating meds.  Still some intrusive SI without reason. Less frequent obsessive thoughts about broken relationship and has been doing this for months.  Can't let it go.  Loop.   No  unusual stress even with the family who is supportive. Likes the benefit that Zyprexa gives. No sleepwalking nor falling nor odd behavior.  No history of sleep walking.  He understands this may recur with an increase.  Also wants option to rarely take extra quetiapine 300 for sleep prn. Plan:  Try to reduce lorazepam if possible.  10/22/2019 appointment with the following noted: Continues fluoxetine 60, lithium 600 mg, lorazepam 2 mg AM and HS and 1 mg midday, olanzapine 10, quetiapine 600 mg HS. Still anxious chronically and including driving in crowded spaces.  Doesn't thing Ativan helps as well as Xanax but less cognitive problems. Sleep is not as good.  Occ EFA.  More EMA and wanting to do things in the middle of the night but this is not typical.  Wonders about why that happens.  Some napping.   Tolerating meds.  Asks about weight loss meds.  Disc this in detail.   Plan no changes except OK meclizine prn vertigo.  02/11/20 appt with the following noted: Able to gradually reduce lorazepam to 1 mg AM and HS. More depressed over time and less interested in things and less interest in going out but does with his parents. Has reduced from 800 to 600 mg HS with some awakening but usually able to go back to sleep.[name]  Spending a good amount of time in bed bc watches TV in bedroom.  Parents watch TV in different part of the house.  No mood swings he notices.   Concerns about weight gain about 200#. Likes olanzapine's benefit for sleep. Plan: Trial for TRD to  Increase fluoxetine to 80 mg daily  to use the combo with olanzapine for TR bipolar depression.   04/21/20 appt with following noted: I thought in beginning some benefit with fluoxetine.  Lifelong negative thinking continues.   Not much bipolar.  Reads a lot on bipolar.   Still tired and anxious a little more may be seasonal.  No familial stressors.  Tends to lay down in afternoon.  Anxiety not over anything in particular.   No SE with  fluoxetine. Took meclizine prn.  Easily motion sick.    Asks questions about newer drugs for bipolar like Caplyta. Plan:  No med changes  06/30/20 appt noted: Tired of wearing masks.  Vaccinated.  Asked questions about when this will end with mask mandate.  Mood up and down some since here and getting up 2-3 times.  Disc awakening problems.  Trying to do better bc night eating some.   Mood is about average today so far.  Albert [name] out with friends for breakfast.  Recognizes activity helps mood.   3 days in a row napped a lot in the afternoon.   Bed is a comfort place.  Gets bored and hard to motivate.  Tries to stay away from night eating and spending.   More depressed when alone and  wonders about med changes. Plan: Failed response to olanzapine + fluoxetine for TR bipolar depression.   Reduce fluoxetine to 1 daily and reduce olanzapine to one half nightly for 10 days and then stop it. Then start Caplyta 1 daily  08/27/2020 appointment with the following noted: Patient decompensated with the above switch to Caplyta with intrusive suicidal thoughts and had to be hospitalized for psychiatric reasons.  Multiple phone calls with family members since that time.  Spoke with the psychiatric nurse practitioner with the decision to restart olanzapine in place of the Caplyta given the patient was more stable while on the combination of Seroquel and olanzapine than with the Caplyta. Caplyta triggered HI/SI and depressed and confused on it, and agitated and still has some of it now. No akathisia. Frustrated with lack of therapy at the hospital.   Hydroxyzine didn't help jitteriness.  Feels some better.  Fleeting SI & HI but not obsessive like it was prior to hospitalization.  Feels a little amped up and nervous.  Scared of what could happen.  Apprehensive being here talking about things. 5-6 hours sleep last night and would like to have more. At mom and dad's house right now and up more in the day. Inadvertently  stopped lorazepam abruptly by mistake likely contributing to shakiness. Still some memory issues.  Trying to stay out of bed when watching TV.  Some awakening but managing. Occ fleeting SI and distracts himself.   Always spends a lot of time just laying around.   Can have anxiety for no reason like coming here, and varies in intensity without pattern.      Less panic than in the past.  Some chronic depression and hyperactivity and loudness and hyperverbal.  Usually back to sleep.  Total 6 hours but some napping, awakens 2-4 times nightly but back to sleep..  Taking quetiapine 600 mg HS.  still lacks interest and motivation.  Can follow a TV show if interested. Plan: Restart lorazepam 1 mg in the morning, 1 mg in the afternoon and 1 mg at night for anxiety Stop hydroxyzine  Increase olanzapine to 1 and 1/2 of 10 mg tablets in evening  09/05/2020 appt noted: seen with parents today at his request Made med changes noted and tolerated the changes Better than I did last week. Now only fleeting HI/SI and "no where near what it was".  Sleeping better.  Still not a lot of energy.  Anhedonia.  Less agitation.  A lot of anxiety all the time but it's helping.  Would like more energy and motivation.   Doesn't handle stress well. Parents note he's less shakey.  He's still staying with his parents.  Only driven once since this happened and F wants him to be able to drive and function more independently.  M agrees he's better.  Memory is better but not normal.  Primarily STM problems No akathisia with olanzapine this time.    Administering his own meds but mo watched. Slept 8 hours last night. Plan consider further reductions in quetiapine  10/17/20 appt noted: Stayed on Seroquel 600 HS and olanzapine 15. Olanzapine seemed to do most for the sleep.  Also helping eliminated HI/SI for the most part.  Wants to try to increase it.  Depression is much better with less rumination also.  Fears akathisia at higher dose as in  the past. Also on fluoxetine 40.   Plan: Reduce Seroquel to 1 and 1/2 tablets at night Increase olanzapine to 1 of the 15  mg tablet and 1/2 of the 5 mg tablet for 1 week,  Then, if tolerated increase the olanzapine to 20 mg or 1 of the 15 mg tablets and 1 of the 5 mg tablets. If possible then also reduce quetiapine to 1 tablet at night.  11/05/2020 appointment with the following noted: Up to olanzapine 20 mg hs for 3 days.  Reduced Serooquel to 450 mg HS.  Didn't try to go lower. Had hiatal hernia and umbilical hernia surgery recently with no problem.   No akathisia so far. Sleep ok with meds so far changes.  No mood changes yet but overall likes the smoothness of olanzapine and helping sleep without knociing him out. Still some occ HI/SI, he thinks bc of anxiety generally.  Easily anxious. Plan: Continue olanzapine 20 mg nightly longer to give it time to help mood sx reduce quetiapine to 1 tablet at night to minimize polypharmacy and reduce risk of akathisia.  01/07/21 appt noted: Able to reduce quetiapine 300 mg HS ok with decent sleep but still wakes a lot.  No AM hangover.  Don't have a lot to do.  Can enjoy going out and doing things.  But doesn't do it longterm.  Family is doing good.  Father started year 33 at his job.   Tolerating the meds well.  May wake more with quetiapine reduction. Lost 10#. Wonders if could try other meds he didn't tolerate before bc now can tolerate olanzapine and couldn't tolerate it in the past DT akathisia. Mood never great but can swing into depresssion without reason.   Plan: Continue olanzapine 20 mg nightly longer to give it time to help mood sx reduce quetiapine to 1/2 of the 300 mg  tablet at night to minimize polypharmacy and reduce risk of akathisia.  02/02/2021 appointment with the following noted: Tried reduced quetiapine to 150 mg and after a week had withdrawal sx including jittery and SI and he increased to 250 mg daily.  Felt better after  increasing to 300 mg daily and last week reduced to 250 mg daily.  After increase quetiapine felt better in a few days.  Seems more alert with less quetiapine but can't tolerate a quick withdrawal.   Some awakening.  Plan: Reduce quetiapine by 50 mg every 2-4 weeks.  03/09/2021 phone call: Pt called and said that he is weaning off the seroquel. He was down to 200 mg of the seoquel and he started having withdrawal symptoms and sucidal thoughts. He went back up to 250 mg because he knew he was good on that.    Pt stated he starting back taking 250 mg on Saturday due to the symptoms he was having.Now he is no longer having suicidal thoughts but still very jittery. MD: He may just need to taper slower than most people.  If he can tolerate the 250 mg Seroquel with the jitteriness it should get better over the next 1 to 2 weeks.  Then he can try going down again by 50 mg at a time.  He probably needs to go down by just 50 mg every 2 to 4 weeks and wait till he fully adjusts to each reduction before he reduces again.  03/17/21 appt noted: Hernia surgery 2 weeks ago. Seroquel 250 mg for a month then 200 mg daily and had SI and increased again to 300 mg daily.  Would love to get off it.  Withdrawal triggers intrusive HI/SI but otherwise doesn't have them No SE. Sleep is OK but  not enough REM sleep.  Satisfied with other meds.   No current HI/SI.  No paranoia.  Still has anxiety and taking Ativan every 6 hours.  It works but doesn't cure it. Chronic anxiety and ask about ketamine for chronic depression. Plan: Because he had WD reducing from 250 to 200 mg daily will have to go slowly. Reduce quetiapine by 25 mg every 2-4 weeks. Reduce Seroquel to 250 mg for 2 weeks, Then reduce to 225 mg for 2 weeks, Then reduce to 200 mg for 2 weeks, Then reduce to 175 mg for 2 weeks, Then reduce to 150 mg Also: Buspirone 30 mg tablets for anxiety Start 1/3 tablet twice daily for 1 week Then increase to two thirds  twice daily for 1 week Then increase to 1 tablet twice daily  04/15/2021 appointment with the following noted: Reduced Seroquel to 200 mg HS and increased buspirone to 30 mg BID STM px and wants to try to reduce Ativan to see if it is better.  But fears with drawal sx. Parents complain about his memory. Less HI and SI since here. Less depressed. Plan: Buspirone 30 mg tablets for anxiety Start 1/3 tablet twice daily for 1 week Then increase to two thirds twice daily for 1 week Then increase to 1 tablet twice daily Continue olanzapine 20 mg nightly longer to give it time to help mood sx Because he had WD reducing from 250 to 200 mg daily will have to go slowly. Reduce quetiapine by 25 mg every 2-4 weeks. Reduce Seroquel to 250 mg for 2 weeks, Then reduce to 225 mg for 2 weeks, Then reduce to 200 mg for 2 weeks, Then reduce to 175 mg for 2 weeks, Then reduce to 150 mg Trial taper lorazepam: In hopes of improving short-term memory  04/30/2022 phone call: Complaining of withdrawal symptoms reducing lorazepam from 0.5 mg 5 times a day to 0.5 mg 4 times daily.  05/21/2021 appointment with the following noted: Gotten onto buspar 30 BID and down on lorazepam to 0.5 mg QID. Gotten down to Seroquel 200 mg HS A little restless every now and then but able to reduce lorazepam for the last weeks. Is too inactive and lays in bed too much watching TV.   Can' t tell effect of buspirone bc of other changes.  Sleep with awakening with 6-7 hours. Fleeting SI without trigger or plan or intent. Depressed more than anxious. Enjoys watching stocks and investing. Plan: Trial taper lorazepam gradually over time In hopes of improving short-term memory But for now continue lorazepam 0.5 mg QID Start clonidine 0.1 mg tablets one half at night for 3 nights, then 1/2 tablet twice daily for 3 nights, then one half in the morning and 1 tablet at night If clonidine does not noticeably help anxiety call the  office  05/27/2021 phone call initially complaining of homicidal and suicidal thoughts after taking 1 mg of clonidine.  He was told to stop the medication.  But then called back stating he was having homicidal and suicidal thoughts from withdrawal from the medication which was not logical.  He agreed it was not logical and stated on 05/29/2021 that he felt better  06/23/2021 appointment the following noted: No clonidine. Sunday panic attack eating out and then having SI.  Stayed with father to feel safer. Intrusive thoughts of hurting parents or others.  HI only when has SI.  Usually panic without SI.  Then had intrusive thoughts about hurting others.   Usually intrusive  thoughts are brief and fleeting.    07/20/21  appt noted; More anxious with less lithium.  More anxious in his body.  Not more depressed or irritable.  Sleep is the same.  More racing thoughts.  Chronic SI probably a litte worse but not unmanageable. Rduced lithium last week to 300 mg daily. No recent change in lisinopril. Off the clonidine for a week or so. Doesn't drink much water.  Drinks a lot of diet coke. No marked akathisia or RLS but does shake his leg DT anxiety. Worries about needing mor emedicine. Plan: Increase olanzapine to 25 mg nightly bc more racing thoughts and anxiety with less lithium DT severity of sx and difficulty getting off Seroquel conside rincrease.  07/30/21 TC : CO racing SI today and wonders about whether increased sx racing thoughts, hyperactivity, SI related to Reduced lithium from 600 mg daily to 300 mg daily recently DT lithium level 1.8.  could be related.    Plan: increase olanzapine 30 mg pm Today take lithium 600 mg now.  Tomorrow start lithium CR 450 mg daily Continue olanzapine 30 mg PM until SI resolves unless akathisia returns.  Albert Staggers, MD, DFAPA  08/17/21 appt noted:  No akathisia with olanzapine 30 mg pm (about 3 weeks) and is sleeping better. Increased lithium to 450 mg  daily. Tolerating meds. Still racing negative thoughts maybe some better.  Asked about it. SI come and go.   Anxiety about the same as depression.  No motivation unless has to do something. Plan: Reduce  fluoxetine 20 mg 1 daily in hopes racing thoughts and anxiety are better.  09/14/2021 appointment with the following noted: No difference noted with reduction fluoxetine 20 mg daily. Most of the time negative thoughts, ex "what if I get in a wreck?"  Frequent negative thoughts.   Still on lorazepam 0.5 mg TID.   Wonders about trying prior meds that caused akathisia bc no longer gets it with olanzapine 30 mg daily. Sleep is usually ok.  Taking Seroquel 200 mg HS.  Thinks the olanzapine helps his sleep and doesn't want to stop it. Panic 1-2 times per month.  Then takes extra lorazepam. Plan: DC fluoxetine 20 mg 1 daily  Trial Auvelity off label for depression 1 in the AM for 1 week then 1 in AM and 1 with evening meal  10/15/2021 appointment with the following noted: Occ of getting confused as to day and went to church on a Thursday my mistake.   Had it happen a few months ago too.  Memory is terrible. Thinks he couldn't tolerate more Auvelity. Not a lot of benefit from [place] 1 daily. Still quite a bit of anxiety.   A little drowsy  and sleeps 7-8 hours at night. Word finding problems.  Lost wallet. Plan: DC  Auvelity  For [facility]: memantine 1/2 tablet in the AM for 1 week, then 1/2 tablet twice daily, then 1/2 tablet in the AM and 1 tablet at night for 1 week then 1 tablet twice daily  10/22/2021 phone call complaining of additional stress asking to start new medication because of anxiety and more suicidal thoughts without intent or plan.  Reports taking more than 4 prescribed Ativan a day recently.  Asking to increase olanzapine which is already at 30 mg daily. MD response:We cannot go any higher in the dosage of olanzapine that he is currently taking.  A lot of these suicidal thoughts are  apparently anxiety driven.  Let us have him resume taking  paroxetine which is one of the more effective medicines for anxiety.  I will send it in and the instructions will be paroxetine 20 mg tablets 1/2 tablet daily for 1 week then 1 tablet daily  11/16/21 appt noted: Multiple phone calls since she was here.  Complaining of suicidal thoughts from memantine which was stopped. Started paroxetine and up to 20 mg daily.  Continues olanzapine 30 mg, lithium 450 mg nightly, lorazepam 0.5 mg 4 times daily , And quetiapine 200 mg nightly. Questions about whether memantine caused the neg SI really bc has them at times. When has SI often anxiety driven.  Will tend to ruminate on past relationship ended.   Edgy and irritable all the time but memantine seemed to make it worse. Mind is getting better with less SI lately.   No SE with paroxetine.   Aggrivated by forgetfulness.  eXample, talked with someone about going to breakfast last night and then forgot to go this mornin.g. Plan prescribed donepezil 5 mg daily for cognitive complaints specifically forgetfulness  12/31/2021 appointment with the following noted: He had been prescribed donepezil 5 mg for cognitive complaints.  He called back reporting he felt it was causing homicidal thoughts and was instructed to stop it.  He has had these types of thoughts in the past.  He had no desire to act on them. Discouraged. Ongoing depression and reduced enjoyment and negative thoughts.  Can enjoy some things. A lot of anxiety chronically.  Trying to limit Ativan to 5 of 0.5 mg daily.  Sometimes needs 2. Constipation for years.  02/03/22 appt noted: Depression makes LBP worse. Ongoing chronic depression. Tolerated rivastigmine without triggering SI Chronic anxiety and Ativan helps some. Occ panic but not usual.  Asks if any other meds coud be used. Plan: But for now continue lorazepam 0.5 mg QID, but use LED for cog reasons Increase rivastigmine to 3 mg capsule  1 twice daily or 2 of the 1.5 mg capsules twice daily.  Call if you have problems with nausea Start dextromethorphan 1 capsule twice daily . Thi sis off label for depression based on MOA of Auvelity and use of paroxetine to prolong half life of DM. Presence of paroxetine may have been reason he didn't tolerate Auvelity brief trial before.  Will proceed slowly.  03/17/2022 appointment noted: Current psych meds: Rivastigmine 3 mg twice daily, quetiapine 200 mg nightly, paroxetine 40 mg daily, olanzapine 30 mg nightly, Namenda 10 mg daily, lorazepam 0.5 mg 4 times daily, lithium CR 450 mg nightly, dextromethorphan 15 mg twice daily A lot of anxiety since here.  Some days taken more lorazepam to 6-7 tablets daily.  No major triggers but anxious even about coming here and other normal things.  Cancelled trip to collect rocks with friend and cancelled the trip bc anxious.  Usually if can go he is ok.  Lots of SI ongoing Memory might be a little better. Asks how he responded to [employer].  Will check paper chart.  03/24/22 TC:  CO SI worse and restilless with Rivastigmine 9 mg . MD response:  Stop the rivastigmine since the [place] appears to be having side effects from it.  He was having anxiety at the last appointment 2 and then we increased the dose.  This might be causing the anxiety to be worse. He has chronic suicidal thoughts without intent or plan.  However it appears the thoughts are more intense with the rivastigmine     05/06/21 appt noted: Stopped rivastigmine DT  anxiety and worsening SI.  Was better the next day. This week had some SI.  Thinking about the prior relationship tends to lead to SI. Will take Ativan when gets SI and it helps a little at 0.5 mg at a time. Plan: Consider fluvoxamine.  Yes this may help with obsessions on old GF and he failed other optioons. Start fluvoxamine 1/2 of 50 mg tablet and reduce paroxetine to 1 and 1/2 of 20 mg tablets for 5 days, Then increase fluvoxamine  to 1 tablet daily and reduce paroxetine to 1 tablet daily for 5 days, Then increase fluvoxamine to 1-1/2 tablets daily and reduce paroxetine to 1/2 tablet daily for 5 days, Then increase fluvoxamine to 2 tablets daily and stop paroxetine   06/07/22 appt noted: Feels like the fluvoxamine 50 BID is too much.  On this a couple of weeks. Worries he might be worse with this with regard to SI.    He thinks it's worse with increase.   New neighbor who has a young boy. He worries that Reginal Lutes is triggering depression and intrusive SI/HI a couple of days later and lasts a little while. Has had anxiety turning into panic attacks.  This gets associated with depression and SI/HI.  These thoughts are intrusive and he doesn't want to act on them.  Can have HI even towards family with whom he has no negative emotions.  Little things like a bump up in parking lot sets off his depression and anxiety and doesn't seem to handle things.   No sig akathisia.  In the past it's been continuous.   Awakens every 2-3 hours.  Usually goes back to sleep with meds. Has not had alcohol in 20 years and had some intrusive thoughts without drinking.   Plan: As soon as he feels comfortable increase fluvoxamine to 50 mg AM and 100 mg PM for intrusive thoughts of HI/SI and obs on GF  07/06/22 appt noted: Increased fluvoxamine to 150 mg daily and felt more agitated and some SI so reduced to 100 mg 4 days ago and feels better so far.  Likes the fact it is good for anxiety and obs thoughts.  Wonders if there is a similar med.  Frustrated he can't get relief.  Some improvement in obs on exGF but not resolved. Continue other meds: lithium CR 450 mg HS, lorazepam 0.5 mg QID, Memantine 10 BID, olanzapine 30 mg HS, quetiapine 200 mg HS Tingling and burning in legs at night but doesn't interfere with sleep. Got insurance to pay for [facility] and lost 13#.   No full panic lately. But some anxiety waves.   Still major problems with memory. Plan:  Wean fluvoxamine 50 mg daily for a month then 1/2 daily for 2 weeks then stop it DT  NR.  Watch for more anxiety.  07/28/22 TC: Franchot Erichsen, CMA  to Me     07/28/22  4:16 PM Note Mom called reporting patient has been confused, dizzy, and sluggish for the last 3 days. He is now staying with her. She had several questions in regards to this:   Lithium level - she feels he needs one done, last result in Epic was 08/2021 that I see. She would like order sent to [facility] for tomorrow.   Patient is on Wegovy, has been for about a month, and she wonders if this could cause sx.   Patient has meclizine and she is questioning if she should give that for his dizziness considering other  sx.   She questions if perhaps he got up during the night and took additional medications, though doesn't know if this happened and if so what he took.    On 3/5 visit he was to wean fluvoxamine to 50 mg qd for a month and then 1/2 tab for 2 weeks and then stop. Mom and pt unsure if he started the wean.    He has F/U 4/4.        Me  to Franchot Erichsen, CMA   CC   07/28/22  5:44 PM Note Yes get lithium level in the AM and don't take any lithium tonight or in the morning before the blood test.     Reginal Lutes should not cause these sx except if he is losing wt quickly or is dehydrated it can cause lithium toxicity and include these sx.  He needs to drink lots of water.   He should go ahead and wean the fluvoxamine as instructed in the last appt.   If the sx get worse, go to the ER.    Me   CC   07/29/22  6:49 PM Note RTC   Sx not as severe as when he had to go to hospital in the past.  Albert [name] got Lithium level today.   Speech is not good .  Confused at times, but better today.  A little tremor .  A little balance problems.  No falls.  No diarrhea Took lihtium 450 last night. Mo says he got up in the middle of the night and moved things around last night and he doesn't rmember.   No change in night time  meds.   Gradually reducing fluvoxamine.   No extra lorazepam.     Spoke with mother who verifies the same.   No med changes but push fluids.   Albert Staggers, MD, DFAPA      08/05/22 appt : with parents Pt hosp 08/02/22 with acute kidney injury and somewhat elevated lithium level with some confusion.  Spoke with psych NP at hosp and lithium was decreased to 450 mg HS. No pending appt known with medical doctors about kidney function. Don't feel good and having HI and SI and feels he needs lithium for these issues.  Doesn't feel he should have been discharged.   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 No added meds at night but has been confused at night and getting up multiple times and doing things he doesn't remember.  This concerns parents.  Parents been up at night with him and he doesn't make sense.  Still groggy in the am per mo for an hour.   Plan: Plan: Get lithium level and kidney test ASAP Stop fluvoxamine Reduce lithium 300 mg capsule 1 at night Monday night: reduce to olanzapine to 20 mg at night (stop the 10 mg tablet) and start clozapine prescription. Monday 4/15 : increase clozapine to 100 mg at night and reduce quetiapine to 1/2 of 200 mg tablet and continue olanzapine 20 mg each evening.  08/01/22 & 08/11/22 ED for HI and SI  08/12/22 TC mother:  Mother, Patsy called. Per note made by Larinda Buttery. He was referred to [facility].Reporting that he did go up there last night. This morning they called her and told her to come pick him up. They told her he wasn't having homicidal or suicidal thoughts and it would be four to five days of him waiting to go to a facility in [place]. They recommended him for [facility]  to start 4/16. She picked him up today. He is worried about these thoughts coming back up. Also, [facility] came out yesterday to assess him and offered counseling from [facility]. He wants to do counseling with them.  Any input on care he can receive?  He is so discouraged and wants to be safe.  (She notes  that the [facility] gave him- Zyprexa 10mg  at 1:07am and again at 1:25am per the med list- based on his previous med list) He was supposed to be off of Zyprexa10mg . Has been on  Clozapine since Monday. He did go ahead and take it before he left ( 2pills) so he hasn't missed a dose and did go get labwork done. Is wanting to continue the Clozapine, please check the labwork to be able to fill the 100mg  dose.    MD resp:  Continue all meds as RX except go ahead and increase clozapine to 100 mg tablets, 1 each night as soon as he can get the 100 mg tablets from pharmacy.  RX sent.  Trying to speed up his chance of response by getting it to a hgiher dose.  Clozapine is very effective for HI/SI.  If he gets worsening HI/SI in the interim it will not be caused by meds but just the natural waxing and waining of his sx.  Once clozapine dose gets high enough it should help but avg patient needs 300 mg so we have to just be patient.      08/20/22 appt:  Up to 100 mg clozapine HS SE constipation and using Colace, Miralax.  Been to [facility] twice  since here.   Ongoing continuous HI/SI.   All I want to do is go to sleep to escape.  Dep causing the thoughts.   Taking Ativan 0.5 mg BID-QID.  He thinks mother not giving him enough of it.  He thinks anxiety triggers HI/SI.  He fears HI and hopes he's strong enough to reisist.  Not angry or wanting to hurt anyone in reality but has the intrusive thoughts. Conc and memory still not good.   Sleep not much different.   Staying with parents currently. Dep gotten worse M worrying he's dependent on lorazepam.   Confusion at night is no longer happening.  Not wandering at night.  Plan: For severe constipation start Linzess 145 mg AM Get lithium level and kidney test ASAP Stopped fluvoxamine and confusion at night is gone  lithium 300 mg capsule 1 at night  olanzapine to 20 mg at night  increase clozapine to 150 mg at night for 3 nights then 200 mg HS   olanzapine 20 mg each  evening. Continue lorazepam 0.5 mg QID, needs this and informed mother  09/07/22 appt noted: Psych meds: clozapine 250 MG HS, lithium 300 mg HS, lorazepam 0.5 mg BID-QID, olanzapine 20 .  Off Seroquel, linzess 290 mg daily, Miralax, Colace. Sleep 7-8 hours without change from before clozapine.   Mood is still very depressed and still gets anxiety. Still problems with constipation.   Dep is so much it is causing intrusive HI/SI.   No more bizarre behaviors at night.   Plan: Plan: Get lithium level and kidney test ASAP  lithium 300 mg capsule 1 at night  olanzapine to 20 mg at night  increase clozapine to  300 mg HS as soon as tolerated and then 400 mg HS.  olanzapine 20 mg each evening. Continue lorazepam 0.5 mg QID, needs this and informed mother  10/07/22 appt noted: alone  and with parents Rough month.  Altered voice for a couple of weeks or so..  Mind not good.  Feel out of sorts.  Jittery.  Constipation.  No appetite.  On Wegovy.  Saw GI doc.   May need to reduce Wegovy bc poor appetite and constipation.  Lost 25 #. Current psych med: clozapine 400 mg HS about a week or 2, olanzapine 20 mg HS, lithium 300 mg HS, lorazepam 0.5 mg QID. Reduced intrusive thoughts.  2 nights since here night eating.   Sleep 6 hours.  Limited napping during the day.   Anhedonia.  Lower motivation.  Tremor worse. M says he sleeps pretty well but awakens  BP is under control. Plan Reduce lithium to 150 mg capsule 1 at night DT level higher Reduce  olanzapine to 10 mg at night  Continue clozapine 400 mg HS bc helping SI and HI and  Continue lorazepam 0.5 mg QID, needs this and informed mother  11/08/22 appt noted:  alone and with parents. Ongoing calls between visits Doesn't think clozapine helping him sleep as well as olanzapine for sleep or any other benefit. While at restaurant had intrusive HI/SI. Staying with parents seems to provoke intrusive HI.  Is grateful to them.  Sat woke up with them.   Sometimes goes away and sometimes not.  No know trigger and no intent. Saw PCP.  Off Wegovy.  Started metformin. No effect noted from clozapine.   M limits his lorazepam.  PCP referring him to neuro to FU abnormal head scan.  He wonders if his speech and memory problems are related.   Plan: Cont clozapine 400 mg HS Check clozapine level and then direct dosing.  11/16/22 TC from pt to disc level:  MD resp:  Clozapine level ok but may benefit with increase to 500 mg nightly.  Tell him to increase with current supply.     12/06/22 TC from pt:  complaining of worsening intrusive ego-dystonic HI/SI without intent, desire or plan.   MD resp ok to increase clozapine to 600 mg HS.  12/09/22 appt noted: Complaining of ongoing racing thoughts and worsening HI/SI without intent or desire or plan.  Racing thoughts chronic and other thoughts wax and wane.  Usually triggered if starts thinking of something negative Meds: clozapine 600 mg HS, lithium 150, lorazepam 0.5 mg QID, olanzapine 10 HS Sleep with EFA.   Will have trouble going to sleep sometimes with thoughts. Doesn't think clozapine makes him sleepy.  Not really drowsy daytime. No dx OSA and no known apnea.  SE a little dizzy.  Constipation managed with BM every 2-3 days. Better than before.  PCP RX metformin and it helped.   Psych med hx extensive including ECT and  risperidone, Zyprexa 30 Latuda 80 which caused akathisia,  Vraylar, Rexulti, aripiprazole 20 mg with akathisia,  Seroquel 1000 mg,  InVega, Geodon, Saphris with side effects, symbyax, Fanapt NR .  Caplyta SE and markedly worse. Clozapine started end of March lithium 1200 SE,  lamotrigine 300 mg, Depakote 2000 mg, Tegretol, Trileptal and several of these in combinations, gabapentin,  N-acetylcysteine, Nuedexta,     Belsomra with no response,  Lunesta no response, trazodone 200 mg,  Xanax, clonazepam, lorazepam less sedation. Buspirone NR Clonidine SE with 2 trials  Viibryd  40 mg for 3 months with diarrhea,  protriptyline with side effects,  Trintellix 20 mg,  Parnate 50 mg with no response,   Emsam 12 mg for 2 months,   imipramine, venlafaxine,  bupropion was side effects,  Lexapro 20 mg, sertraline, paroxetine, Deplin, fluoxetine 80,  fluvoxamine 150 agitated.  Confusion when with clozapine. Auvelity NR at one daily.  SE BID  methylphenidate 60 mg,  Vyvanse, Concerta, strattera, , modafinil,  Memantine worsening SI? Donepezil, complained of HI, Exelon 3 BID rivastigmine DT worsening SI  pramipexole,  amantadine ,  Patient prone to akathisia.  [facility]   Review of Systems:  Review of Systems  Constitutional:  Positive for appetite change and fatigue.  Respiratory:  Negative for shortness of breath.   Cardiovascular:  Negative for chest pain and palpitations.  Gastrointestinal:  Positive for constipation.  Musculoskeletal:  Positive for back pain.  Neurological:  Positive for dizziness and tremors.       Fidgety  Psychiatric/Behavioral:  Positive for decreased concentration and suicidal ideas. Negative for agitation, behavioral problems, dysphoric mood, hallucinations, self-injury and sleep disturbance. The patient is nervous/anxious. The patient is not hyperactive.     Medications: I have reviewed the patient's current medications.  Current Outpatient Medications  Medication Sig Dispense Refill   acetaminophen (TYLENOL) 500 MG tablet Take 1,000 mg by mouth every 6 (six) hours as needed for moderate pain.     bisacodyl (DULCOLAX) 10 MG suppository Place 1 suppository (10 mg total) rectally as needed for moderate constipation. 12 suppository 0   clozapine (CLOZARIL) 100 MG tablet Take 6 tablets (600 mg total) by mouth at bedtime. 42 tablet 3   esomeprazole (NEXIUM) 40 MG capsule Take 40 mg by mouth daily.     fenofibrate (TRICOR) 145 MG tablet Take 145 mg by mouth daily.     fluticasone (FLONASE) 50 MCG/ACT nasal spray  Place 1 spray into both nostrils daily as needed for allergies or rhinitis.     hydrocortisone (ANUSOL-HC) 2.5 % rectal cream Place 1 application rectally 3 (three) times daily as needed for hemorrhoids.     levothyroxine (SYNTHROID, LEVOTHROID) 75 MCG tablet Take 75 mcg by mouth daily before breakfast.     lisinopril (ZESTRIL) 5 MG tablet Take 5 mg by mouth daily.     lithium carbonate 150 MG capsule TAKE 1 CAPSULE BY MOUTH AT BEDTIME 30 capsule 0   LORazepam (ATIVAN) 0.5 MG tablet Take 1 tablet (0.5 mg total) by mouth 4 (four) times daily. 120 tablet 1   meclizine (ANTIVERT) 25 MG tablet Take 1 tablet (25 mg total) by mouth 3 (three) times daily as needed for dizziness. 30 tablet 1   OLANZapine (ZYPREXA) 10 MG tablet TAKE ONE TABLET BY MOUTH AT BEDTIME 30 tablet 0   polycarbophil (FIBERCON) 625 MG tablet Take 1,250 mg by mouth daily.     sildenafil (VIAGRA) 100 MG tablet Take 1 tablet (100 mg total) by mouth daily as needed for erectile dysfunction. 30 tablet 6   tamsulosin (FLOMAX) 0.4 MG CAPS capsule Take 1 capsule (0.4 mg total) by mouth daily. 90 capsule 3   WEGOVY 1 MG/0.5ML SOAJ Inject 1 mg into the skin once a week.     lubiprostone (AMITIZA) 24 MCG capsule Take 24 mcg by mouth daily with breakfast. (Patient not taking: Reported on 12/09/2022)     No current facility-administered medications for this visit.   Medication Side Effects: Other: mild sleepiness.   Occ twitches.\, tremor  Allergies:  Allergies  Allergen Reactions   Meloxicam Other (See Comments)    Dizziness   Nsaids Other (See Comments)    Hallucinations   Ibuprofen Other (See Comments)    Can  not take because taking lithium   Prednisone Other (See Comments)    Can't sleep    Wellbutrin [Bupropion] Other (See Comments)    Suicidal thoughts    Past Medical History:  Diagnosis Date   ADHD (attention deficit hyperactivity disorder)    Anemia    Anxiety    Bipolar disorder (HCC)    BPH (benign prostatic  hyperplasia)    Chronic kidney disease, stage 3b (HCC)    Constipation    DDD (degenerative disc disease), cervical    Depression    Deviated septum    ED (erectile dysfunction)    GERD (gastroesophageal reflux disease)    Graves disease    History of hiatal hernia    Hypertension    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    Hypothyroidism    Pneumonia    PONV (postoperative nausea and vomiting)     Family History  Problem Relation Age of Onset   Prostate cancer Neg Hx    Bladder Cancer Neg Hx    Kidney cancer Neg Hx     Social History   Socioeconomic History   Marital status: Single    Spouse name: Not on file   Number of children: Not on file   Years of education: Not on file   Highest education level: Not on file  Occupational History   Not on file  Tobacco Use   Smoking status: Former    Types: Cigarettes    Passive exposure: Past   Smokeless tobacco: Former    Types: Engineer, drilling   Vaping status: Never Used  Substance and Sexual Activity   Alcohol use: Not Currently   Drug use: Never   Sexual activity: Yes  Other Topics Concern   Not on file  Social History Narrative   Not on file   Social Determinants of Health   Financial Resource Strain: Low Risk  (11/03/2022)   Received from [facility], [facility]   Overall Financial Resource Strain (CARDIA)    Difficulty of Paying Living Expenses: Not very hard  Food Insecurity: No Food Insecurity (11/03/2022)   Received from [facility], [facility]   Hunger Vital Sign    Worried About Running Out of Food in the Last Year: Never true    Ran Out of Food in the Last Year: Never true  Transportation Needs: No Transportation Needs (11/03/2022)   Received from [facility], [facility]   PRAPARE - Transportation    In the past 12 months, has lack of transportation kept you from medical  appointments or from getting medications?: No    Lack of Transportation (Non-Medical): No  Physical Activity: Not on file  Stress: Stress Concern Present (08/12/2020)   Received from [facility] of Occupational [facility] - Occupational Stress Questionnaire    Feeling of Stress : Very much  Social Connections: Unknown (09/01/2021)   Received from [facility]   Social Network    Social Network: Not on file  Intimate Partner Violence: Unknown (08/06/2021)   Received from [facility]   HITS    Physically Hurt: Not on file    Insult or Talk Down To: Not on file    Threaten Physical Harm: Not on file    Scream or Curse: Not on file    Past Medical History, Surgical history, Social history, and Family history were reviewed and updated  as appropriate.   Please see review of systems for further details on the patient's review from today.   Objective:   Physical Exam:  There were no vitals taken for this visit.  Physical Exam Constitutional:      General: He is not in acute distress.    Appearance: He is well-developed.  Musculoskeletal:        General: No deformity.  Neurological:     Mental Status: He is alert and oriented to person, place, and time.     Cranial Nerves: No dysarthria.     Motor: Tremor present.     Coordination: Coordination normal.     Comments: mild tremor  Psychiatric:        Attention and Perception: Attention and perception normal. He does not perceive auditory or visual hallucinations.        Mood and Affect: Mood is anxious and depressed. Affect is not labile, blunt, angry or tearful.        Speech: Speech normal. Speech is not slurred.        Behavior: Behavior normal. Behavior is not slowed. Behavior is cooperative.        Thought Content: Thought content is not delusional. Thought content includes homicidal and suicidal ideation. Thought content includes suicidal plan.        Cognition and Memory: Cognition normal. He exhibits  impaired recent memory.        Judgment: Judgment normal.     Comments: Insight fair Chronically talkative .directable. But chronically mildly pressured. He is chronically anxious   No manic signs noted. Some wordfinding problems ongoing. Intrusive thoughts episodic HI/SI  Alert and oriented.      Lab Review:     Component Value Date/Time   NA 135 10/20/2022 1123   NA 142 03/20/2014 0514   K 5.2 10/20/2022 1123   K 4.1 03/20/2014 0514   CL 101 10/20/2022 1123   CL 112 (H) 03/20/2014 0514   CO2 20 10/20/2022 1123   CO2 26 03/20/2014 0514   GLUCOSE 150 (H) 10/20/2022 1123   GLUCOSE 101 (H) 08/12/2022 0029   GLUCOSE 99 03/20/2014 0514   BUN 22 10/20/2022 1123   BUN 7 03/20/2014 0514   CREATININE 1.68 (H) 10/20/2022 1123   CREATININE 0.78 03/20/2014 0514   CALCIUM 9.8 10/20/2022 1123   CALCIUM 9.5 03/20/2014 0514   PROT 6.7 08/12/2022 0029   PROT 7.3 03/18/2014 1459   ALBUMIN 4.2 08/12/2022 0029   ALBUMIN 3.2 (L) 03/18/2014 1459   AST 18 08/12/2022 0029   AST 16 03/18/2014 1459   ALT 14 08/12/2022 0029   ALT 16 03/18/2014 1459   ALKPHOS 24 (L) 08/12/2022 0029   ALKPHOS 105 03/18/2014 1459   BILITOT 0.5 08/12/2022 0029   BILITOT 0.3 03/18/2014 1459   GFRNONAA 42 (L) 08/12/2022 0029   GFRNONAA >60 03/20/2014 0514   GFRAA 54 (L) 04/18/2019 1612   GFRAA >60 03/20/2014 0514       Component Value Date/Time   WBC 5.9 12/08/2022 1109   WBC 6.8 08/12/2022 0029   RBC 4.30 12/08/2022 1109   RBC 3.72 (L) 08/12/2022 0029   HGB 13.1 12/08/2022 1109   HCT 39.3 12/08/2022 1109   PLT 531 (H) 12/08/2022 1109   MCV 91 12/08/2022 1109   MCV 89 03/20/2014 0514   MCH 30.5 12/08/2022 1109   MCH 31.5 08/12/2022 0029   MCHC 33.3 12/08/2022 1109   MCHC 33.4 08/12/2022 0029   RDW 12.6 12/08/2022 1109  RDW 12.5 03/20/2014 0514   LYMPHSABS 1.4 12/08/2022 1109   LYMPHSABS 3.2 03/20/2014 0514   MONOABS 0.5 08/12/2022 0029   MONOABS 1.0 03/20/2014 0514   EOSABS 0.3 12/08/2022  1109   EOSABS 0.4 03/20/2014 0514   BASOSABS 0.1 12/08/2022 1109   BASOSABS 0.1 03/20/2014 0514    Lithium Lvl  Date Value Ref Range Status  10/20/2022 0.4 (L) 0.5 - 1.2 mmol/L Final    Comment:    A concentration of 0.5-0.8 mmol/L is advised for long-term use; concentrations of up to 1.2 mmol/L may be necessary during acute treatment.                                  Detection Limit = 0.1                           <0.1 indicates None Detected    08/12/21 lithium level 0.9 on 450 mg.  lithium level Sept 0.8.   lithium level July 27, 2018 was normal at 1.0.   Lithium level [facility] October 03, 2018 = 1.2. Said he got lithium level at [facility] as requested.  Labs not in Epic.  Recent lipids ok except higher TG than usual.  Normal A1C.  11/10/22 Checked clozapine level on 400 mg HS= cloz 733+norclozapine 294 = total 1027  .res Assessment: Plan:    Everest was seen today for follow-up, depression, anxiety, sleeping problem and medication reaction.  Diagnoses and all orders for this visit:  Severe bipolar I disorder, current or most recent episode depressed (HCC)  GAD (generalized anxiety disorder)  Panic disorder with agoraphobia  Drug-induced constipation  Insomnia due to mental condition  Mild cognitive impairment  Lithium use  Long term current use of clozapine   45 min appt with pt  needs a lot of time.   Chronic TR bipolar mixed and chronic anxiety.  He usually has mixed bipolar symptoms which we have not been able to completely eliminate.  See long list of meds tried.   He is  chronically unstable  Chronic frequent ph calls between appts often about the same issue repeatedly most recently re: intrusive ego dystonic thoughts HI/SI.  More like obsessions than true HI/SI.   Recent hosp 08/02/22 with SI and acute kidney injury and elevated lithium. Processed this and disc goal of trying to stop lithium but risk of worsening SI.  He has chronic SI and there's risk of this  worsening off lithium.  Lithium and clozapine are the most effective meds for SI.  Possible that [facility] made him dehydrated and constipation and caused incr lithium level.  Had 15-20 # wt loss.  We discussed the short-term risks associated with benzodiazepines including sedation and increased fall risk among others.  Discussed long-term side effect risk including dependence, potential withdrawal symptoms, and the potential eventual dose-related risk of dementia.  But recent studies from 2020 dispute this association between benzodiazepines and dementia risk. Newer studies in 2020 do not support an association with dementia.  Unfortunately due to the severity of set his symptoms polypharmacy is a necessity.  We are reducing meds to day to address.    Lithium being used bc chronic SI and death thoughts.  Counseled patient regarding potential benefits, risks, and side effects of lithium to include potential risk of lithium affecting thyroid and renal function.  Discussed need for periodic lab monitoring to  determine drug level and to assess for potential adverse effects.  Counseled patient regarding signs and symptoms of lithium toxicity and advised that they notify office immediately or seek urgent medical attention if experiencing these signs and symptoms.  Patient advised to contact office with any questions or concerns.  Reduced lithium to 150 mg daily Dt hx toxicitty Checked lithium level and BMP and level 1.1 on 300 mg Daily so reduced again to 150 mg daily and level 0.4 Call if death thoughts worsen or worsening SI.  Disc in detail again clozapine.  Disc data showing less SI with it but he's had no changes.    Disc risk in detail including low WBC with complication, myocarditis.  Extensive discussion of CBC monitoring.   Disc this again bc taking Wegovy might help him to tolerate it better.  He's started [facility] and lost 25 #. Cont clozapine 600 mg HS.  Started 12/07/22.  11/10/22 Checked clozapine  level on 400 mg HS= cloz 733+norclozapine 294 = total 1027  Did he take duloxetine?  Might help depression more.  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  Discussed long-term side effect risk including dependence, potential withdrawal symptoms, and the potential eventual dose-related risk of dementia.  But recent studies from 2020 dispute this association between benzodiazepines and dementia risk. Newer studies in 2020 do not support an association with dementia.  Discussed safety plan at length with patient.  Advised patient to contact office with any worsening signs and symptoms.  Instructed patient to go to the [facility] emergency room for evaluation if experiencing any acute safety concerns, to include suicidal intent.  He commits to safety.  Disc Ozempic and Monjauro for weight loss.  Has had weight gain from meds as a contributor. But lately has lost some weight.  Disc Dr. Pollie Meyer mentioned evidence it could help depression.  I rec he continue this also.  Unlikely to worsen SI.  He's making progress.  Needs to keep up activity as much as possible for depression.  For severe constipation seeing GI Will not make further changes to address this. Had constipation before clozapine and GI appt in June started Amitza which helped some. Needs to push fluids.    Historically he has not done well with serotonergic medicines which would typically be used for obsessive thoughts.  Therefore consider a stronger dopamine antagonist in place of olanzapine.  He may get additional benefit with more time on clozapine and the risperidone may be unnecessary but he feels  desperate for some relief from the intrusive obsessive thoughts Plan for intrusive HI/SI wean olanzapine and trial of risperidone.  Reduce olanzapine to 5 mg HS for 1 week then stop it.  Addressed his fears of not sleeping off olanzapine. Then if sleeping start risperidone.  FU 4 weeks   Albert Staggers, MD, DFAPA  Future Appointments  Date Time Provider Department Center  12/30/2022  2:30 PM Sondra Come, MD [facility] None  01/11/2023  1:00 PM Cottle, Steva Ready., MD [facility] None  02/09/2023  1:00 PM Cottle, Steva Ready., MD [facility] None    No orders of the defined types were placed in  this encounter.      -------------------------------

## 2022-12-14 ENCOUNTER — Other Ambulatory Visit: Payer: Self-pay | Admitting: Urology

## 2022-12-14 DIAGNOSIS — N401 Enlarged prostate with lower urinary tract symptoms: Secondary | ICD-10-CM

## 2022-12-15 ENCOUNTER — Other Ambulatory Visit: Payer: Self-pay | Admitting: Psychiatry

## 2022-12-16 LAB — CBC WITH DIFFERENTIAL/PLATELET
Basophils Absolute: 0.1 10*3/uL (ref 0.0–0.2)
Basos: 1 %
EOS (ABSOLUTE): 0.4 10*3/uL (ref 0.0–0.4)
Eos: 8 %
Hematocrit: 38.3 % (ref 37.5–51.0)
Hemoglobin: 12.6 g/dL — ABNORMAL LOW (ref 13.0–17.7)
Immature Grans (Abs): 0 10*3/uL (ref 0.0–0.1)
Immature Granulocytes: 0 %
Lymphocytes Absolute: 1.6 10*3/uL (ref 0.7–3.1)
Lymphs: 28 %
MCH: 30.1 pg (ref 26.6–33.0)
MCHC: 32.9 g/dL (ref 31.5–35.7)
MCV: 92 fL (ref 79–97)
Monocytes Absolute: 0.3 10*3/uL (ref 0.1–0.9)
Monocytes: 6 %
Neutrophils Absolute: 3.4 10*3/uL (ref 1.4–7.0)
Neutrophils: 57 %
Platelets: 464 10*3/uL — ABNORMAL HIGH (ref 150–450)
RBC: 4.18 x10E6/uL (ref 4.14–5.80)
RDW: 12.3 % (ref 11.6–15.4)
WBC: 5.9 10*3/uL (ref 3.4–10.8)

## 2022-12-17 ENCOUNTER — Telehealth: Payer: Self-pay | Admitting: Psychiatry

## 2022-12-17 NOTE — Telephone Encounter (Signed)
Noted.  Thanks. Will try to stop olanzapine gradually.

## 2022-12-17 NOTE — Telephone Encounter (Signed)
Patient lvm requesting a return call from [place] to discuss how he is doing with his constipation dilemma. Appointment 01/11/23  Contact # [phone]

## 2022-12-17 NOTE — Telephone Encounter (Signed)
Patient said he was to cut Zyprexa 10 mg in half and let you know in a week how he was sleeping. He reported that he is sleeping ok on the 5 mg.

## 2022-12-20 ENCOUNTER — Other Ambulatory Visit: Payer: Self-pay | Admitting: Psychiatry

## 2022-12-20 DIAGNOSIS — Z79899 Other long term (current) drug therapy: Secondary | ICD-10-CM

## 2022-12-20 DIAGNOSIS — F4001 Agoraphobia with panic disorder: Secondary | ICD-10-CM

## 2022-12-20 DIAGNOSIS — F411 Generalized anxiety disorder: Secondary | ICD-10-CM

## 2022-12-20 DIAGNOSIS — F314 Bipolar disorder, current episode depressed, severe, without psychotic features: Secondary | ICD-10-CM

## 2022-12-22 ENCOUNTER — Other Ambulatory Visit: Payer: Self-pay | Admitting: Psychiatry

## 2022-12-23 ENCOUNTER — Other Ambulatory Visit: Payer: Self-pay

## 2022-12-23 ENCOUNTER — Encounter: Payer: Self-pay | Admitting: Gastroenterology

## 2022-12-23 ENCOUNTER — Ambulatory Visit
Admission: RE | Admit: 2022-12-23 | Discharge: 2022-12-23 | Disposition: A | Discharge status: Home / Self Care | Payer: Medicare PPO | Attending: Gastroenterology | Admitting: Gastroenterology

## 2022-12-23 ENCOUNTER — Ambulatory Visit: Payer: Medicare PPO | Admitting: Registered Nurse

## 2022-12-23 ENCOUNTER — Encounter
Admission: RE | Disposition: A | Discharge status: Home / Self Care | Payer: Self-pay | Source: Home / Self Care | Attending: Gastroenterology

## 2022-12-23 DIAGNOSIS — N1832 Chronic kidney disease, stage 3b: Secondary | ICD-10-CM | POA: Insufficient documentation

## 2022-12-23 DIAGNOSIS — Z79899 Other long term (current) drug therapy: Secondary | ICD-10-CM | POA: Insufficient documentation

## 2022-12-23 DIAGNOSIS — F319 Bipolar disorder, unspecified: Secondary | ICD-10-CM | POA: Insufficient documentation

## 2022-12-23 DIAGNOSIS — I129 Hypertensive chronic kidney disease with stage 1 through stage 4 chronic kidney disease, or unspecified chronic kidney disease: Secondary | ICD-10-CM | POA: Diagnosis not present

## 2022-12-23 DIAGNOSIS — K219 Gastro-esophageal reflux disease without esophagitis: Secondary | ICD-10-CM | POA: Insufficient documentation

## 2022-12-23 DIAGNOSIS — K5900 Constipation, unspecified: Secondary | ICD-10-CM | POA: Diagnosis present

## 2022-12-23 DIAGNOSIS — M1990 Unspecified osteoarthritis, unspecified site: Secondary | ICD-10-CM | POA: Insufficient documentation

## 2022-12-23 DIAGNOSIS — Z87891 Personal history of nicotine dependence: Secondary | ICD-10-CM | POA: Insufficient documentation

## 2022-12-23 HISTORY — PX: COLONOSCOPY: SHX5424

## 2022-12-23 LAB — CBC WITH DIFFERENTIAL/PLATELET
Basophils Absolute: 0.1 10*3/uL (ref 0.0–0.2)
Basos: 1 %
EOS (ABSOLUTE): 0.4 10*3/uL (ref 0.0–0.4)
Eos: 7 %
Hematocrit: 39.6 % (ref 37.5–51.0)
Hemoglobin: 13.1 g/dL (ref 13.0–17.7)
Immature Grans (Abs): 0 10*3/uL (ref 0.0–0.1)
Immature Granulocytes: 0 %
Lymphocytes Absolute: 1.6 10*3/uL (ref 0.7–3.1)
Lymphs: 30 %
MCH: 30 pg (ref 26.6–33.0)
MCHC: 33.1 g/dL (ref 31.5–35.7)
MCV: 91 fL (ref 79–97)
Monocytes Absolute: 0.3 10*3/uL (ref 0.1–0.9)
Monocytes: 6 %
Neutrophils Absolute: 2.9 10*3/uL (ref 1.4–7.0)
Neutrophils: 56 %
Platelets: 473 10*3/uL — ABNORMAL HIGH (ref 150–450)
RBC: 4.36 x10E6/uL (ref 4.14–5.80)
RDW: 12.4 % (ref 11.6–15.4)
WBC: 5.3 10*3/uL (ref 3.4–10.8)

## 2022-12-23 SURGERY — COLONOSCOPY
Anesthesia: General

## 2022-12-23 MED ORDER — LIDOCAINE HCL (CARDIAC) PF 100 MG/5ML IV SOSY
PREFILLED_SYRINGE | INTRAVENOUS | Status: DC | PRN
  Administered 2022-12-23: 40 mg via INTRAVENOUS

## 2022-12-23 MED ORDER — PROPOFOL 500 MG/50ML IV EMUL
INTRAVENOUS | Status: DC | PRN
  Administered 2022-12-23: 75 ug/kg/min via INTRAVENOUS

## 2022-12-23 MED ORDER — PROPOFOL 10 MG/ML IV BOLUS
INTRAVENOUS | Status: DC | PRN
  Administered 2022-12-23: 50 mg via INTRAVENOUS

## 2022-12-23 MED ORDER — SODIUM CHLORIDE 0.9 % IV SOLN: INTRAVENOUS | Status: DC

## 2022-12-23 NOTE — Transfer of Care (Signed)
Immediate Anesthesia Transfer of Care Note  Patient: Albert [name]  Procedure(s) Performed: COLONOSCOPY  Patient Location: PACU  Anesthesia Type:General  Level of Consciousness: drowsy and patient cooperative  Airway & Oxygen Therapy: Patient Spontanous Breathing  Post-op Assessment: Report given to RN and Post -op Vital signs reviewed and stable  Post vital signs: stable  Last Vitals:  Vitals Value Taken Time  BP 111/72 12/23/22 1349  Temp 36.6 C 12/23/22 1349  Pulse 91 12/23/22 1352  Resp 21 12/23/22 1352  SpO2 99 % 12/23/22 1352  Vitals shown include unfiled device data.  Last Pain:  Vitals:   12/23/22 1349  TempSrc: Temporal  PainSc: 0-No pain         Complications: No notable events documented.

## 2022-12-23 NOTE — H&P (Signed)
Pre-Procedure H&P   Patient ID: Albert [name] is a 57 y.o. male.  Gastroenterology Provider: Jaynie Collins, DO  Referring Provider: Tawni Pummel, PA PCP: Kandyce Rud, MD  Date: 12/23/2022  HPI [name] is a 57 y.o. male who presents today for Colonoscopy for Worsening constipation .  Patient has gone upwards of 3 weeks on a bowel movement.  He notes occasional blood.  Positive for early satiety.  He has trialed MiraLAX Colace mag citrate lactulose and Linzess with only minimal improvement he was recently switched to Amitiza.  Last dose of Wegovy was a month ago.  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    Hypothyroidism    Pneumonia    PONV (postoperative nausea and vomiting)     Past Surgical History:  Procedure Laterality Date   BACK SURGERY     lumbar disc   colonoscopyx2 N/A    2003, 2016   ESOPHAGOGASTRODUODENOSCOPY     1998, 2002, 2003, 2016   ESOPHAGOGASTRODUODENOSCOPY (EGD) WITH PROPOFOL N/A 09/12/2020   Procedure: ESOPHAGOGASTRODUODENOSCOPY (EGD) WITH PROPOFOL;  Surgeon: Earline Mayotte, MD;  Location: [facility] ENDOSCOPY;  Service: Endoscopy;  Laterality: N/A;  1ST CASE   EYE SURGERY  2006   strabismus surgery   HERNIA REPAIR     Hiatal Hernia   INSERTION OF MESH Bilateral 03/03/2021   Procedure: INSERTION OF MESH;  Surgeon: Leafy Ro, MD;  Location: [facility] ORS;  Service: General;  Laterality: Bilateral;   NASAL SEPTUM SURGERY     NM I- 131  THERAPY FOR ABLATION ([facility] HX)  04/11/2015   thyroid   SPINE SURGERY     XI ROBOTIC ASSISTED PARAESOPHAGEAL HERNIA REPAIR N/A 10/23/2020   Procedure: XI ROBOTIC ASSISTED PARAESOPHAGEAL HERNIA REPAIR WITH MESH AND INCISIONAL HERNIA REPAIR WITH MESH;  Surgeon: Leafy Ro, MD;  Location: [facility] ORS;  Service: General;  Laterality: N/A;    Family History No h/o GI disease or malignancy  Review of Systems  Constitutional:  Negative for activity change, appetite change, chills, diaphoresis, fatigue, fever and unexpected weight change.  HENT:  Negative for trouble swallowing and voice change.   Respiratory:  Negative for shortness of breath and wheezing.   Cardiovascular:  Negative for chest pain, palpitations and leg swelling.  Gastrointestinal:  Positive for abdominal distention, abdominal pain, blood in stool and constipation. Negative for anal bleeding, diarrhea, nausea and vomiting.  Musculoskeletal:  Negative for arthralgias and myalgias.  Skin:  Negative for color change and pallor.  Neurological:  Negative for dizziness, syncope and weakness.  Psychiatric/Behavioral:  Negative for confusion. The patient is not nervous/anxious.   All other systems reviewed and are negative.    Medications No current facility-administered medications on file prior to encounter.   Current Outpatient Medications on File Prior to Encounter  Medication Sig Dispense Refill   acetaminophen (TYLENOL) 500 MG tablet  Take 1,000 mg by mouth every 6 (six) hours as needed for moderate pain.     bisacodyl (DULCOLAX) 10 MG suppository Place 1 suppository (10 mg total) rectally as needed for moderate constipation. 12 suppository 0   esomeprazole (NEXIUM) 40 MG capsule Take 40 mg by mouth daily.     fenofibrate (TRICOR) 145 MG tablet Take 145 mg by mouth daily.     fluticasone (FLONASE) 50 MCG/ACT nasal spray Place 1 spray into both nostrils daily as needed for allergies or rhinitis.     levothyroxine (SYNTHROID,  LEVOTHROID) 75 MCG tablet Take 75 mcg by mouth daily before breakfast.     lisinopril (ZESTRIL) 5 MG tablet Take 5 mg by mouth daily.     meclizine (ANTIVERT) 25 MG tablet Take 1 tablet (25 mg total) by mouth 3 (three) times daily as needed for dizziness. 30 tablet 1   polycarbophil (FIBERCON) 625 MG tablet Take 1,250 mg by mouth daily.     sildenafil (VIAGRA) 100 MG tablet Take 1 tablet (100 mg total) by mouth daily as needed for erectile dysfunction. 30 tablet 6   WEGOVY 1 MG/0.5ML SOAJ Inject 1 mg into the skin once a week.     hydrocortisone (ANUSOL-HC) 2.5 % rectal cream Place 1 application rectally 3 (three) times daily as needed for hemorrhoids.      Pertinent medications related to GI and procedure were reviewed by me with the patient prior to the procedure   Current Facility-Administered Medications:    0.9 %  sodium chloride infusion, , Intravenous, Continuous, Jaynie Collins, DO, Last Rate: 20 mL/hr at 12/23/22 1257, New Bag at 12/23/22 1257  sodium chloride 20 mL/hr at 12/23/22 1257       Allergies  Allergen Reactions   Meloxicam Other (See Comments)    Dizziness   Nsaids Other (See Comments)    Hallucinations   Ibuprofen Other (See Comments)    Can not take because taking lithium   Prednisone Other (See Comments)    Can't sleep    Wellbutrin [Bupropion] Other (See Comments)    Suicidal thoughts   Allergies were reviewed by me prior to the procedure  Objective   Body mass index is 22.89 kg/m. Vitals:   12/23/22 1248  BP: (!) 141/91  Pulse: (!) 104  Temp: (!) 97 F (36.1 C)  TempSrc: Temporal  SpO2: 100%  Weight: 70.3 kg     Physical Exam Vitals and nursing note reviewed.  Constitutional:      General: He is not in acute distress.    Appearance: Normal appearance. He is not ill-appearing, toxic-appearing or diaphoretic.  HENT:     Head: Normocephalic and atraumatic.     Nose: Nose normal.     Mouth/Throat:     Mouth: Mucous membranes are  moist.     Pharynx: Oropharynx is clear.  Eyes:     General: No scleral icterus.    Extraocular Movements: Extraocular movements intact.  Cardiovascular:     Rate and Rhythm: Regular rhythm. Tachycardia present.     Heart sounds: Normal heart sounds. No murmur heard.    No friction rub. No gallop.  Pulmonary:     Effort: Pulmonary effort is normal. No respiratory distress.     Breath sounds: Normal breath sounds. No wheezing, rhonchi or rales.  Abdominal:     General: Abdomen is flat. Bowel sounds are normal. There is no distension.     Palpations: Abdomen is soft.     Tenderness: There  is no abdominal tenderness. There is no guarding or rebound.  Musculoskeletal:     Cervical back: Neck supple.     Right lower leg: No edema.     Left lower leg: No edema.  Skin:    General: Skin is warm and dry.     Coloration: Skin is not jaundiced or pale.  Neurological:     Mental Status: He is alert. Mental status is at baseline.      Assessment:  [name] is a 57 y.o. male  who presents today for Colonoscopy for Worsening constipation .  Plan:  Colonoscopy with possible intervention today  Colonoscopy with possible biopsy, control of bleeding, polypectomy, and interventions as necessary has been discussed with the patient/patient representative. Informed consent was obtained from the patient/patient representative after explaining the indication, nature, and risks of the procedure including but not limited to death, bleeding, perforation, missed neoplasm/lesions, cardiorespiratory compromise, and reaction to medications. Opportunity for questions was given and appropriate answers were provided. Patient/patient representative has verbalized understanding is amenable to undergoing the procedure.   Jaynie Collins, DO  [facility]  Portions of the record may have been created with voice recognition software. Occasional wrong-word or 'sound-a-like'  substitutions may have occurred due to the inherent limitations of voice recognition software.  Read the chart carefully and recognize, using context, where substitutions may have occurred.

## 2022-12-23 NOTE — Interval H&P Note (Signed)
History and Physical Interval Note: Preprocedure H&P from 12/23/22  was reviewed and there was no interval change after seeing and examining the patient.  Written consent was obtained from the patient after discussion of risks, benefits, and alternatives. Patient has consented to proceed with Colonoscopy with possible intervention   12/23/2022 1:21 PM  Albert [name]  has presented today for surgery, with the diagnosis of 564.00 (ICD-9-CM) - K59.09 (ICD-10-CM) - Chronic constipation.  The various methods of treatment have been discussed with the patient and family. After consideration of risks, benefits and other options for treatment, the patient has consented to  Procedure(s): COLONOSCOPY (N/A) as a surgical intervention.  The patient's history has been reviewed, patient examined, no change in status, stable for surgery.  I have reviewed the patient's chart and labs.  Questions were answered to the patient's satisfaction.     Jaynie Collins

## 2022-12-23 NOTE — Op Note (Addendum)
[name] Patient Name: Albert [name] Procedure Date: 12/23/2022 1:19 PM MRN: [id number] Account #: [id number] Date of Birth: [birth year] Admit Type: Outpatient Age: 57 Room: [facility] ENDO ROOM 1 Gender: Male Note Status: Finalized Instrument Name: Colonscope [id number] Procedure:             Colonoscopy Indications:           Constipation Providers:             Jaynie Collins DO, DO Referring MD:          Hassell Halim MD (Referring MD) Medicines:             Monitored Anesthesia Care Complications:         No immediate complications. Estimated blood loss: None. Procedure:             Pre-Anesthesia Assessment:                        - Prior to the procedure, a History and Physical was                         performed, and patient medications and allergies were                         reviewed. The patient is competent. The risks and                         benefits of the procedure and the sedation options and                         risks were discussed with the patient. All questions                         were answered and informed consent was obtained.                         Patient identification and proposed procedure were                         verified by the physician, the nurse, the anesthetist                         and the technician in the endoscopy suite. Mental                         Status Examination: alert and oriented. Airway                         Examination: normal oropharyngeal airway and neck                         mobility. Respiratory Examination: clear to                         auscultation. CV Examination: RRR, no murmurs, no S3                         or S4. Prophylactic Antibiotics: The patient does not  require prophylactic antibiotics. Prior                         Anticoagulants: The patient has taken no anticoagulant                         or antiplatelet agents. ASA Grade  Assessment: II - A                         patient with mild systemic disease. After reviewing                         the risks and benefits, the patient was deemed in                         satisfactory condition to undergo the procedure. The                         anesthesia plan was to use monitored anesthesia care                         (MAC). Immediately prior to administration of                         medications, the patient was re-assessed for adequacy                         to receive sedatives. The heart rate, respiratory                         rate, oxygen saturations, blood pressure, adequacy of                         pulmonary ventilation, and response to care were                         monitored throughout the procedure. The physical                         status of the patient was re-assessed after the                         procedure.                        After obtaining informed consent, the colonoscope was                         passed under direct vision. Throughout the procedure,                         the patient's blood pressure, pulse, and oxygen                         saturations were monitored continuously. The                         Colonoscope was introduced through the anus with the  intention of advancing to the cecum. The scope was                         advanced to the rectum before the procedure was                         aborted. Medications were given. The colonoscopy was                         aborted due to poor bowel prep with stool present. The                         patient tolerated the procedure well. The quality of                         the bowel preparation was poor. Findings:      The perianal and digital rectal examinations were normal. Pertinent       negatives include normal sphincter tone.      A large amount of extensive amounts of copious quantities of semi-solid       stool was found in the  rectum, precluding visualization. Estimated blood       loss: none.      Procedure aborted due to stool present in rectum. Impression:            - The procedure was aborted due to poor bowel prep                         with stool present.                        - Preparation of the colon was poor.                        - Stool in the rectum.                        - No specimens collected. Recommendation:        - Patient has a contact number available for                         emergencies. The signs and symptoms of potential                         delayed complications were discussed with the patient.                         Return to normal activities tomorrow. Written                         discharge instructions were provided to the patient.                        - Discharge patient to home.                        - Resume previous diet.                        - Continue  present medications.                        - Repeat colonoscopy because the bowel preparation was                         poor.                        - Perform miralax cleanout as previously prescribed by                         Ms. Clydene Pugh and continue with Amitiza and miralax- 1                         capful twice a day.                        - The findings and recommendations were discussed with                         the patient's family.                        - The findings and recommendations were discussed with                         the patient. Procedure Code(s):     --- Professional ---                        [phone], 53, Colonoscopy, flexible; diagnostic,                         including collection of specimen(s) by brushing or                         washing, when performed (separate procedure) Diagnosis Code(s):     --- Professional ---                        K59.00, Constipation, unspecified CPT copyright 2022 American Medical Association. All rights reserved. The codes documented in  this report are preliminary and upon coder review may  be revised to meet current compliance requirements. Attending Participation:      I personally performed the entire procedure. Elfredia Nevins, DO Jaynie Collins DO, DO 12/23/2022 1:49:06 PM This report has been signed electronically. Number of Addenda: 0 Note Initiated On: 12/23/2022 1:19 PM Total Procedure Duration: 0 hours 1 minute 7 seconds  Estimated Blood Loss:  Estimated blood loss: none.      [facility]

## 2022-12-23 NOTE — Anesthesia Preprocedure Evaluation (Signed)
Anesthesia Evaluation  Patient identified by MRN, date of birth, ID band Patient awake    Reviewed: Allergy & Precautions, NPO status , Patient's Chart, lab work & pertinent test results  History of Anesthesia Complications (+) PONV and history of anesthetic complications  Airway Mallampati: II  TM Distance: >3 FB Neck ROM: Full    Dental  (+) Poor Dentition, Missing, Dental Advidsory Given   Pulmonary neg shortness of breath, neg sleep apnea, neg COPD, neg recent URI, former smoker   breath sounds clear to auscultation- rhonchi (-) wheezing      Cardiovascular hypertension, Pt. on medications (-) angina (-) CAD, (-) Past MI, (-) Cardiac Stents and (-) CABG (-) dysrhythmias (-) Valvular Problems/Murmurs Rhythm:Regular Rate:Normal - Systolic murmurs and - Diastolic murmurs    Neuro/Psych neg Seizures PSYCHIATRIC DISORDERS Anxiety Depression Bipolar Disorder   negative neurological ROS     GI/Hepatic Neg liver ROS, hiatal hernia,GERD  ,,  Endo/Other  neg diabetesHypothyroidism    Renal/GU Renal InsufficiencyRenal disease     Musculoskeletal  (+) Arthritis ,    Abdominal  (+) - obese  Peds  Hematology  (+) Blood dyscrasia, anemia   Anesthesia Other Findings Past Medical History: No date: ADHD (attention deficit hyperactivity disorder) No date: Anemia No date: Anxiety No date: Bipolar disorder (HCC) No date: BPH (benign prostatic hyperplasia) No date: Chronic kidney disease, stage 3b (HCC) No date: DDD (degenerative disc disease), cervical No date: Depression No date: Deviated septum No date: ED (erectile dysfunction) No date: GERD (gastroesophageal reflux disease) No date: Graves disease No date: History of hiatal hernia No date: Hypertension No date: 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 No date: Hypothyroidism No date: Pneumonia No date: PONV (postoperative nausea and vomiting)    Reproductive/Obstetrics negative OB ROS                             Anesthesia Physical Anesthesia Plan  ASA: 3  Anesthesia Plan: General   Post-op Pain Management:    Induction: Intravenous  PONV Risk Score and Plan: 3 and Propofol infusion and TIVA  Airway Management Planned: Natural Airway and Nasal Cannula  Additional Equipment:   Intra-op Plan:   Post-operative Plan:   Informed Consent: I have reviewed the patients History and Physical, chart, labs and discussed the procedure including the risks, benefits and alternatives for the proposed anesthesia with the patient or authorized representative who has indicated his/her understanding and acceptance.     Dental advisory given  Plan Discussed with: CRNA and Anesthesiologist  Anesthesia Plan Comments:         Anesthesia Quick Evaluation

## 2022-12-24 ENCOUNTER — Encounter: Payer: Self-pay | Admitting: Gastroenterology

## 2022-12-29 ENCOUNTER — Ambulatory Visit: Payer: Medicare PPO | Attending: Neurology | Admitting: Speech Pathology

## 2022-12-29 ENCOUNTER — Other Ambulatory Visit: Payer: Self-pay | Admitting: Psychiatry

## 2022-12-29 DIAGNOSIS — R41841 Cognitive communication deficit: Secondary | ICD-10-CM | POA: Diagnosis present

## 2022-12-29 DIAGNOSIS — G3184 Mild cognitive impairment, so stated: Secondary | ICD-10-CM | POA: Diagnosis present

## 2022-12-29 NOTE — Anesthesia Postprocedure Evaluation (Signed)
Anesthesia Post Note  Patient: Albert [name]  Procedure(s) Performed: COLONOSCOPY  Patient location during evaluation: Endoscopy Anesthesia Type: General Level of consciousness: awake and alert Pain management: pain level controlled Vital Signs Assessment: post-procedure vital signs reviewed and stable Respiratory status: spontaneous breathing, nonlabored ventilation, respiratory function stable and patient connected to nasal cannula oxygen Cardiovascular status: blood pressure returned to baseline and stable Postop Assessment: no apparent nausea or vomiting Anesthetic complications: no   No notable events documented.   Last Vitals:  Vitals:   12/23/22 1403 12/23/22 1410  BP: 121/89 (!) 132/93  Pulse: 89 88  Resp: 20 17  Temp:    SpO2: 100% 100%    Last Pain:  Vitals:   12/24/22 0739  TempSrc:   PainSc: 0-No pain                 Lenard Simmer

## 2022-12-29 NOTE — Therapy (Signed)
OUTPATIENT SPEECH LANGUAGE PATHOLOGY  COGNITION EVALUATION   Patient Name: Albert [name] MRN: [id number] DOB:10-27-1965, 57 y.o., male Today's Date: 12/30/2022  PCP: Kandyce Rud, MD REFERRING PROVIDER: Cristopher Peru, MD   End of Session - 12/29/22 1523     Visit Number 1    Number of Visits 9    Date for SLP Re-Evaluation 01/26/23    Authorization Type [employer] PPO    Progress Note Due on Visit 10    SLP Start Time 1530    SLP Stop Time  1615    SLP Time Calculation (min) 45 min    Activity Tolerance Patient tolerated treatment well             Past Medical History:  Diagnosis Date   ADHD (attention deficit hyperactivity disorder)    Anemia    Anxiety    Bipolar disorder (HCC)    BPH (benign prostatic hyperplasia)    Chronic kidney disease, stage 3b (HCC)    Constipation    DDD (degenerative disc disease), cervical    Depression    Deviated septum    ED (erectile dysfunction)    GERD (gastroesophageal reflux disease)    Graves disease    History of hiatal hernia    Hypertension    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    Hypothyroidism    Pneumonia    PONV (postoperative nausea and vomiting)    Past Surgical History:  Procedure Laterality Date   BACK SURGERY     lumbar disc   COLONOSCOPY N/A 12/23/2022   Procedure: COLONOSCOPY;  Surgeon: Jaynie Collins, DO;  Location: [facility] ENDOSCOPY;  Service: Gastroenterology;  Laterality: N/A;   colonoscopyx2 N/A    2003, 2016   ESOPHAGOGASTRODUODENOSCOPY     1998, 2002, 2003, 2016   ESOPHAGOGASTRODUODENOSCOPY (EGD) WITH PROPOFOL N/A 09/12/2020   Procedure: ESOPHAGOGASTRODUODENOSCOPY (EGD) WITH PROPOFOL;  Surgeon: Earline Mayotte, MD;  Location: [facility] ENDOSCOPY;  Service: Endoscopy;  Laterality: N/A;  1ST CASE   EYE SURGERY  2006   strabismus surgery   HERNIA REPAIR     Hiatal Hernia   INSERTION OF MESH Bilateral 03/03/2021   Procedure: INSERTION OF MESH;  Surgeon: Leafy Ro, MD;  Location: [facility] ORS;  Service: General;  Laterality: Bilateral;   NASAL SEPTUM SURGERY     NM I- 131 THERAPY FOR ABLATION ([facility] HX)  04/11/2015   thyroid   SPINE SURGERY     XI ROBOTIC ASSISTED PARAESOPHAGEAL HERNIA REPAIR N/A 10/23/2020   Procedure: XI ROBOTIC ASSISTED PARAESOPHAGEAL HERNIA REPAIR WITH MESH AND INCISIONAL HERNIA REPAIR WITH MESH;  Surgeon: Leafy Ro, MD;  Location: [facility] ORS;  Service: General;  Laterality: N/A;   Patient Active Problem List   Diagnosis Date Noted   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   S/P repair of paraesophageal hernia 10/23/2020   Secondary hyperparathyroidism of renal origin (HCC) 07/03/2019   Anemia in chronic kidney disease 05/29/2019   Benign hypertensive kidney disease with chronic kidney disease 05/29/2019   Stage 3 chronic kidney disease (HCC) 05/29/2019   BPH associated with nocturia 04/02/2019   Essential hypertriglyceridemia 04/02/2019   Gastroesophageal reflux disease without esophagitis 04/02/2019   Severe depressed bipolar I disorder without psychotic features (HCC) 02/10/2018   Need for prophylactic chemotherapy 02/10/2018   GAD (generalized anxiety disorder) 02/10/2018    ONSET DATE: date of referral  12/28/2022  REFERRING DIAG: G31.84 (ICD-10-CM) - Mild cognitive impairment   THERAPY DIAG:  Cognitive communication  deficit  Mild cognitive impairment  Rationale for Evaluation and Treatment Rehabilitation  SUBJECTIVE:   SUBJECTIVE STATEMENT: Pt pleasant, eager, conversant Pt accompanied by: self  PERTINENT HISTORY: Pt is a 57 year old male with history of history of bipolar disorder with treatment of ECT and lithium. Currently being followed by psychiatry (Dr Jennelle Human) and Patient was evaluated at [facility] (08/01/2022) and [facility] (08/11/2022) for suicidal and homicidal ideation. Pt seen by neurology for reported slurring of speech, short-term memory problems and word finding difficulties.  In reviewing pt's notes from Dr Jennelle Human, it appears pt has been experiencing a mild cognitive impairment that waxes and wanes with his disorder.    DIAGNOSTIC FINDINGS:  08/01/2022 CT Head Wo Contrast  IMPRESSION: No acute intracranial abnormality. Pansinus mucosal thickening with frothy secretions in the right sphenoid sinus, which can be seen in the setting of acute sinusitis   PAIN:  Are you having pain? No   FALLS: Has patient fallen in last 6 months?  No  LIVING ENVIRONMENT: Lives with: lives alone Lives in: House/apartment  PLOF:  Level of assistance: Independent with ADLs, Independent with IADLs Employment: On disability   PATIENT GOALS   to get help with his memory and speech  OBJECTIVE:   COGNITIVE COMMUNICATION Overall cognitive status: No family/caregiver present to determine baseline cognitive functioning Areas of impairment:  Attention: Impaired: Sustained Memory: Impaired: Immediate Working Short term Behavior: Restless and Impulsive Functional Impairments: pt is able to live by himself with intermittent help form his parents  AUDITORY COMPREHENSION  Overall auditory comprehension: Appears intact YES/NO questions: Appears intact Following directions: Appears intact Conversation: Moderately Complex   READING COMPREHENSION: Intact  EXPRESSION: verbal  VERBAL EXPRESSION:   Overall verbal expression: Appears intact Level of generative/spontaneous verbalization: conversation Automatic speech: name: intact and social response: intact  Repetition: Appears intact Verbal Fluency deficits noted in formal assessment Pragmatics: Appears intact Non-verbal means of communication: N/A  WRITTEN EXPRESSION: Dominant hand: right Written expression: Appears intact  ORAL MOTOR EXAMINATION Facial : WFL Lingual: WFL Velum: WFL Mandible: WFL Cough: WFL Voice: WFL  MOTOR SPEECH: Overall motor speech: Appears intact Respiration: diaphragmatic/abdominal  breathing Phonation: normal Resonance: WFL Articulation: Appears intact Intelligibility: Intelligibility reduced Motor planning: Appears intact   STANDARDIZED ASSESSMENTS: Addenbrooke's Cognitive Examination - ACE III The Addenbrooke's Cognitive Examination-III (ACE-III) is a brief cognitive test that assesses five cognitive domains. The total score is 100 with higher scores indicating better cognitive functioning. Cut off scores of 88 and 82 are recommended for suspicion of dementia (88 has sensitivity of 1.00 and specificity of 0.96, 82 has sensitivity of 0.93 and specificity of 1.00). American Version B  Attention 18/18  Memory 17/26  Fluency 4/14  Language 24/26  Visuospatial 11/16  TOTAL ACE- III Score 74/100      PATIENT EDUCATION: Education details: results of this assessment, ST POC Person educated: Patient Education method: Explanation Education comprehension: verbalized understanding    GOALS:  Goals reviewed with patient? Yes  SHORT TERM GOALS: Target date: 10 sessions  With Min A, patient will use a circumlocution strategy, writing, drawing, and/or gesturing to describe target words with 80% accuracy to improve word-finding and reduce communication breakdowns, Baseline: Goal status: INITIAL  2.  With Min A, patient will report <2 missed (medications/bills/appointments) over 2 week period with use of external memory aids/strategies.  Baseline:  Goal status: INITIAL   LONG TERM GOALS: Target date: 01/26/2023  With Supervision A, patient will participate in complex conversation at  75% accuracy to increase ability to communicate complex thoughts, feelings, and needs.  Baseline:  Goal status: INITIAL  2.  With Min A, patient will use strategies (ie., white board, daily planner/calendar, Apps on phone) to improve memory for important information with 75% accuracy.  Baseline:  Goal status: INITIAL  ASSESSMENT:  CLINICAL IMPRESSION: Patient is a 57 y.o.  male who was seen today for a cognitive communication evaluation.  Pt arrives to evaluation with report that he doesn't feel that his bipolar disorder is not well controlled at this time "we have been working on it for 15 years."   Pt presents with cognitive and language distortions that are characteristic within Bipolar Disorder. These are   Rapid Speech Pt's speech is considered pressured and intermittently dysfluent with unintentional moments of talking over this Clinical research associate. As such, pt's speech intelligibility fluctuates ~ >90% at the unknown conversation level. Pt's speech switches quickly from one discourse structure to another  Distractibility - Inability to Complete Tasks Pt describes intermittent use of compensatory memory strategies such as setting alarms for reminders, medication management. He also benefited from re-direction to task at hand d/t tangential comments conversations.   Flight of Ideas Pt also describes that his mind frequently goes fasted than his mouth is able to keep up, tnagential conversations were frequent within this assessment.    Verbal Fluency Deficits  Pt with deficits in both semantic and phonemic fluency as evidenced by ability to name 6 animals and 6 words starting with /p/ in one minute. He also states, "my brain is not saying what I want it to say."  Will trial short course of ST services to introduce some compensatory strategies but pt is likely to benefit from cognitive behavioral therapy and ongoing treatment for bipolar as the above symptoms are related to the disorder and symptoms typically improve with treatment of underlying disorder.    OBJECTIVE IMPAIRMENTS include attention, memory, and expressive language. These impairments are limiting patient from return to work, managing medications, managing appointments, managing finances, household responsibilities, ADLs/IADLs, and effectively communicating at home and in community. Factors affecting potential  to achieve goals and functional outcome are co-morbidities. Patient will benefit from skilled SLP services to address above impairments and improve overall function.  REHAB POTENTIAL: Good  PLAN: SLP FREQUENCY: 1-2x/week  SLP DURATION: 4 weeks  PLANNED INTERVENTIONS: SLP instruction and feedback, Compensatory strategies, and Patient/family education    Aniyia Rane B. Dreama Saa, M.S., CCC-SLP, Tree surgeon Certified Brain Injury Specialist [facility]  [facility] Rehabilitation Services Office [phone] Ascom [phone] Fax [phone]

## 2022-12-30 ENCOUNTER — Ambulatory Visit: Payer: Medicare PPO | Admitting: Urology

## 2022-12-30 ENCOUNTER — Encounter: Payer: Self-pay | Admitting: Urology

## 2022-12-30 VITALS — Ht 69.0 in | Wt 155.0 lb

## 2022-12-30 DIAGNOSIS — Z125 Encounter for screening for malignant neoplasm of prostate: Secondary | ICD-10-CM

## 2022-12-30 DIAGNOSIS — N529 Male erectile dysfunction, unspecified: Secondary | ICD-10-CM

## 2022-12-30 DIAGNOSIS — R3914 Feeling of incomplete bladder emptying: Secondary | ICD-10-CM | POA: Diagnosis not present

## 2022-12-30 DIAGNOSIS — N401 Enlarged prostate with lower urinary tract symptoms: Secondary | ICD-10-CM

## 2022-12-30 LAB — CBC WITH DIFFERENTIAL/PLATELET
Basophils Absolute: 0.1 10*3/uL (ref 0.0–0.2)
Basos: 1 %
EOS (ABSOLUTE): 0.3 10*3/uL (ref 0.0–0.4)
Eos: 6 %
Hematocrit: 38.3 % (ref 37.5–51.0)
Hemoglobin: 13.1 g/dL (ref 13.0–17.7)
Immature Grans (Abs): 0 10*3/uL (ref 0.0–0.1)
Immature Granulocytes: 0 %
Lymphocytes Absolute: 1.4 10*3/uL (ref 0.7–3.1)
Lymphs: 33 %
MCH: 31.3 pg (ref 26.6–33.0)
MCHC: 34.2 g/dL (ref 31.5–35.7)
MCV: 92 fL (ref 79–97)
Monocytes Absolute: 0.3 10*3/uL (ref 0.1–0.9)
Monocytes: 6 %
Neutrophils Absolute: 2.3 10*3/uL (ref 1.4–7.0)
Neutrophils: 54 %
Platelets: 463 10*3/uL — ABNORMAL HIGH (ref 150–450)
RBC: 4.18 x10E6/uL (ref 4.14–5.80)
RDW: 12.3 % (ref 11.6–15.4)
WBC: 4.4 10*3/uL (ref 3.4–10.8)

## 2022-12-30 LAB — BLADDER SCAN AMB NON-IMAGING

## 2022-12-30 MED ORDER — SILDENAFIL CITRATE 100 MG PO TABS: 100.0000 mg | ORAL_TABLET | Freq: Every day | ORAL | 6 refills | Status: DC | PRN

## 2022-12-30 MED ORDER — TAMSULOSIN HCL 0.4 MG PO CAPS
0.4000 mg | ORAL_CAPSULE | Freq: Every day | ORAL | 3 refills | Status: DC
Start: 2022-12-30 — End: 2023-12-28

## 2022-12-30 NOTE — Progress Notes (Signed)
   12/30/2022 2:58 PM   Albert [name] [birth year] [id number]  Reason for visit: Follow up BPH, ED, history of microscopic hematuria, PSA screening  HPI: 57 year old male with bipolar disorder who presents for follow-up of the above issues.  He has a long history of persistent microscopic hematuria negative work-up in 2012 with Dr. Achilles Dunk.  He has refused repeat work-ups numerous times.  He denies any gross hematuria.  He is currently taking Flomax 0.4 mg nightly which he feels greatly improves his urinary symptoms.  PVR is normal today at 0 mL.  He has some mild postvoid dribbling that is minimally bothersome.  He has ED that is well controlled with 100 mg sildenafil on demand.  PSA June 2024 normal and stable at 0.31.  He has had some problems with constipation which exacerbated his urinary symptoms.  We discussed the relationship between constipation and urination, and strategies discussed.  I personally viewed and interpreted the CT abdomen and pelvis dated 10/21/2022 for abdominal pain showing no urologic abnormalities, small prostate, decompressed bladder, and significant constipation.  He also drinks a fair amount of diet soda, and we discussed avoiding bladder irritants.  Flomax and Revatio refilled RTC 1 year PVR, symptom check Can continue PSA screening every other year per guideline recommendation  Sondra Come, MD  [facility] [address], [address] [place], [place] [address] [phone]

## 2022-12-30 NOTE — Patient Instructions (Signed)
Avoid diet drinks and sodas as these can worsen urinary symptoms.  Continue to work on your bowels, his constipation will make urinary symptoms worse as well

## 2023-01-05 ENCOUNTER — Other Ambulatory Visit: Payer: Self-pay | Admitting: Psychiatry

## 2023-01-05 ENCOUNTER — Ambulatory Visit: Payer: Medicare PPO | Admitting: Speech Pathology

## 2023-01-06 ENCOUNTER — Telehealth: Payer: Self-pay | Admitting: Psychiatry

## 2023-01-06 DIAGNOSIS — F314 Bipolar disorder, current episode depressed, severe, without psychotic features: Secondary | ICD-10-CM

## 2023-01-06 LAB — CBC WITH DIFFERENTIAL/PLATELET
Basophils Absolute: 0.1 10*3/uL (ref 0.0–0.2)
Basos: 1 %
EOS (ABSOLUTE): 0.3 10*3/uL (ref 0.0–0.4)
Eos: 5 %
Hematocrit: 39.7 % (ref 37.5–51.0)
Hemoglobin: 13.3 g/dL (ref 13.0–17.7)
Immature Grans (Abs): 0 10*3/uL (ref 0.0–0.1)
Immature Granulocytes: 0 %
Lymphocytes Absolute: 1.6 10*3/uL (ref 0.7–3.1)
Lymphs: 34 %
MCH: 30.8 pg (ref 26.6–33.0)
MCHC: 33.5 g/dL (ref 31.5–35.7)
MCV: 92 fL (ref 79–97)
Monocytes Absolute: 0.3 10*3/uL (ref 0.1–0.9)
Monocytes: 5 %
Neutrophils Absolute: 2.6 10*3/uL (ref 1.4–7.0)
Neutrophils: 55 %
Platelets: 488 10*3/uL — ABNORMAL HIGH (ref 150–450)
RBC: 4.32 x10E6/uL (ref 4.14–5.80)
RDW: 12.1 % (ref 11.6–15.4)
WBC: 4.8 10*3/uL (ref 3.4–10.8)

## 2023-01-06 NOTE — Telephone Encounter (Signed)
Refill has already been sent to Dr. Jennelle Human.

## 2023-01-06 NOTE — Telephone Encounter (Signed)
Patient lvm at 12:09 for refill on Clozapine 100mg . States he is out and doesn't have any to take tonight. PH: [phone]

## 2023-01-10 ENCOUNTER — Ambulatory Visit: Payer: Medicare PPO | Admitting: Psychiatry

## 2023-01-10 ENCOUNTER — Ambulatory Visit: Payer: Medicare PPO | Attending: Neurology | Admitting: Speech Pathology

## 2023-01-10 DIAGNOSIS — G3184 Mild cognitive impairment, so stated: Secondary | ICD-10-CM | POA: Insufficient documentation

## 2023-01-10 DIAGNOSIS — R41841 Cognitive communication deficit: Secondary | ICD-10-CM | POA: Insufficient documentation

## 2023-01-11 ENCOUNTER — Encounter: Payer: Self-pay | Admitting: Psychiatry

## 2023-01-11 ENCOUNTER — Ambulatory Visit (INDEPENDENT_AMBULATORY_CARE_PROVIDER_SITE_OTHER): Payer: Medicare PPO | Admitting: Psychiatry

## 2023-01-11 DIAGNOSIS — E538 Deficiency of other specified B group vitamins: Secondary | ICD-10-CM

## 2023-01-11 DIAGNOSIS — F411 Generalized anxiety disorder: Secondary | ICD-10-CM | POA: Diagnosis not present

## 2023-01-11 DIAGNOSIS — K5903 Drug induced constipation: Secondary | ICD-10-CM | POA: Diagnosis not present

## 2023-01-11 DIAGNOSIS — Z79899 Other long term (current) drug therapy: Secondary | ICD-10-CM

## 2023-01-11 DIAGNOSIS — F4001 Agoraphobia with panic disorder: Secondary | ICD-10-CM | POA: Diagnosis not present

## 2023-01-11 DIAGNOSIS — F314 Bipolar disorder, current episode depressed, severe, without psychotic features: Secondary | ICD-10-CM | POA: Diagnosis not present

## 2023-01-11 DIAGNOSIS — F5105 Insomnia due to other mental disorder: Secondary | ICD-10-CM

## 2023-01-11 MED ORDER — CLOZAPINE 100 MG PO TABS
600.0000 mg | ORAL_TABLET | Freq: Every day | ORAL | 8 refills | Status: DC
Start: 2023-01-11 — End: 2023-02-09

## 2023-01-11 MED ORDER — RISPERIDONE 2 MG PO TABS
2.0000 mg | ORAL_TABLET | Freq: Every day | ORAL | 1 refills | Status: AC
Start: 2023-01-11 — End: ?

## 2023-01-11 NOTE — Therapy (Signed)
OUTPATIENT SPEECH LANGUAGE PATHOLOGY  TREATMENT NOTE  Patient Name: Albert [name] MRN: [id number] DOB:Feb 08, 1966, 57 y.o., male Today's Date: 01/11/2023  PCP: Kandyce Rud, MD REFERRING PROVIDER: Cristopher Peru, MD   End of Session - 01/11/23 1507     Visit Number 2    Number of Visits 9    Date for SLP Re-Evaluation 01/26/23    Authorization Type [employer] PPO    Progress Note Due on Visit 10    SLP Start Time 1145    SLP Stop Time  1230    SLP Time Calculation (min) 45 min    Activity Tolerance Patient tolerated treatment well             Past Medical History:  Diagnosis Date   ADHD (attention deficit hyperactivity disorder)    Anemia    Anxiety    Bipolar disorder (HCC)    BPH (benign prostatic hyperplasia)    Chronic kidney disease, stage 3b (HCC)    Constipation    DDD (degenerative disc disease), cervical    Depression    Deviated septum    ED (erectile dysfunction)    GERD (gastroesophageal reflux disease)    Graves disease    History of hiatal hernia    Hypertension    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    Hypothyroidism    Pneumonia    PONV (postoperative nausea and vomiting)    Past Surgical History:  Procedure Laterality Date   BACK SURGERY     lumbar disc   COLONOSCOPY N/A 12/23/2022   Procedure: COLONOSCOPY;  Surgeon: Jaynie Collins, DO;  Location: [facility] ENDOSCOPY;  Service: Gastroenterology;  Laterality: N/A;   colonoscopyx2 N/A    2003, 2016   ESOPHAGOGASTRODUODENOSCOPY     1998, 2002, 2003, 2016   ESOPHAGOGASTRODUODENOSCOPY (EGD) WITH PROPOFOL N/A 09/12/2020   Procedure: ESOPHAGOGASTRODUODENOSCOPY (EGD) WITH PROPOFOL;  Surgeon: Earline Mayotte, MD;  Location: [facility] ENDOSCOPY;  Service: Endoscopy;  Laterality: N/A;  1ST CASE   EYE SURGERY  2006   strabismus surgery   HERNIA REPAIR     Hiatal Hernia   INSERTION OF MESH Bilateral 03/03/2021   Procedure: INSERTION OF MESH;  Surgeon: Leafy Ro,  MD;  Location: [facility] ORS;  Service: General;  Laterality: Bilateral;   NASAL SEPTUM SURGERY     NM I- 131 THERAPY FOR ABLATION ([facility] HX)  04/11/2015   thyroid   SPINE SURGERY     XI ROBOTIC ASSISTED PARAESOPHAGEAL HERNIA REPAIR N/A 10/23/2020   Procedure: XI ROBOTIC ASSISTED PARAESOPHAGEAL HERNIA REPAIR WITH MESH AND INCISIONAL HERNIA REPAIR WITH MESH;  Surgeon: Leafy Ro, MD;  Location: [facility] ORS;  Service: General;  Laterality: N/A;   Patient Active Problem List   Diagnosis Date Noted   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   S/P repair of paraesophageal hernia 10/23/2020   Secondary hyperparathyroidism of renal origin (HCC) 07/03/2019   Anemia in chronic kidney disease 05/29/2019   Benign hypertensive kidney disease with chronic kidney disease 05/29/2019   Stage 3 chronic kidney disease (HCC) 05/29/2019   BPH associated with nocturia 04/02/2019   Essential hypertriglyceridemia 04/02/2019   Gastroesophageal reflux disease without esophagitis 04/02/2019   Severe depressed bipolar I disorder without psychotic features (HCC) 02/10/2018   Need for prophylactic chemotherapy 02/10/2018   GAD (generalized anxiety disorder) 02/10/2018    ONSET DATE: date of referral  12/28/2022  REFERRING DIAG: G31.84 (ICD-10-CM) - Mild cognitive impairment   THERAPY DIAG:  Cognitive communication deficit  Mild cognitive impairment  Rationale for Evaluation and Treatment Rehabilitation  SUBJECTIVE:    PERTINENT HISTORY: Pt is a 57 year old male with history of history of bipolar disorder with treatment of ECT and lithium. Currently being followed by psychiatry (Dr Jennelle Human) and Patient was evaluated at [facility] (08/01/2022) and [facility] (08/11/2022) for suicidal and homicidal ideation. Pt seen by neurology for reported slurring of speech, short-term memory problems and word finding difficulties. In reviewing pt's notes from Dr Jennelle Human, it appears pt has been experiencing a mild  cognitive impairment that waxes and wanes with his disorder.    DIAGNOSTIC FINDINGS:  08/01/2022 CT Head Wo Contrast  IMPRESSION: No acute intracranial abnormality. Pansinus mucosal thickening with frothy secretions in the right sphenoid sinus, which can be seen in the setting of acute sinusitis   PAIN:  Are you having pain? No   FALLS: Has patient fallen in last 6 months?  No  LIVING ENVIRONMENT: Lives with: lives alone Lives in: House/apartment  PLOF:  Level of assistance: Independent with ADLs, Independent with IADLs Employment: On disability   PATIENT GOALS   to get help with his memory and speech  SUBJECTIVE STATEMENT: Pt pleasant, eager, conversant "Can you write down that this is related to me being bipolar" Pt accompanied by: self  OBJECTIVE:   TODAY'S TREATMENT Skilled treatment session focused on pt's current cognitive communication goals. SLP facilitated session by providing the following interventions:  SLP provided skilled verbal and written information regarding the impact of Bipolar Disorder on cognition, specifically, attention, memory and executive functions.   In addition to the above information, pt benefited from verbal redirection to task d/t tangential comments. While pt's speech intelligibility is decreased d/t fast rate of speech, he is responsive to requests to repeat information with slower rate to improve from 75% to > 95% intelligibility at the sentence level. He is dependent on cues.   PATIENT EDUCATION: Education details: see above Person educated: Patient Education method: Explanation Education comprehension: verbalized understanding    GOALS:  Goals reviewed with patient? Yes  SHORT TERM GOALS: Target date: 10 sessions  With Min A, patient will use a circumlocution strategy, writing, drawing, and/or gesturing to describe target words with 80% accuracy to improve word-finding and reduce communication breakdowns, Baseline: Goal  status: INITIAL  2.  With Min A, patient will report <2 missed (medications/bills/appointments) over 2 week period with use of external memory aids/strategies.  Baseline:  Goal status: INITIAL   LONG TERM GOALS: Target date: 01/26/2023  With Supervision A, patient will participate in complex conversation at 75% accuracy to increase ability to communicate complex thoughts, feelings, and needs.  Baseline:  Goal status: INITIAL  2.  With Min A, patient will use strategies (ie., white board, daily planner/calendar, Apps on phone) to improve memory for important information with 75% accuracy.  Baseline:  Goal status: INITIAL  ASSESSMENT:  CLINICAL IMPRESSION: Patient is a 57 y.o. male who was seen today for a cognitive communication treatment. Pt continues to benefit from support for attention, recall of information and cues to repeat verbal information for increased speech intelligibility d/t fast rate of speech.   Will trial short course of ST services to introduce some compensatory strategies but pt is likely to benefit from cognitive behavioral therapy and ongoing treatment for bipolar as the above symptoms are related to the disorder and symptoms typically improve with treatment of underlying disorder.    OBJECTIVE IMPAIRMENTS include attention, memory, and expressive language. These  impairments are limiting patient from return to work, managing medications, managing appointments, managing finances, household responsibilities, ADLs/IADLs, and effectively communicating at home and in community. Factors affecting potential to achieve goals and functional outcome are co-morbidities. Patient will benefit from skilled SLP services to address above impairments and improve overall function.  REHAB POTENTIAL: Good  PLAN: SLP FREQUENCY: 1-2x/week  SLP DURATION: 4 weeks  PLANNED INTERVENTIONS: SLP instruction and feedback, Compensatory strategies, and Patient/family education    Damariz Paganelli B.  Dreama Saa, M.S., CCC-SLP, Tree surgeon Certified Brain Injury Specialist [facility]  [facility] Rehabilitation Services Office [phone] Ascom [phone] Fax [phone]

## 2023-01-11 NOTE — Patient Instructions (Signed)
Start risperidone 2 mg tablet, 1 at night for racing thoughts and intrusive homicidal and suicidal thoughts. Call in 3 weeks if not better and we will increase the dose

## 2023-01-11 NOTE — Progress Notes (Signed)
Albert [name] [id number] Mar 30, 1966 57 y.o.    Subjective:   Patient ID:  Albert [name] is a 57 y.o. (DOB 01/27/1966) male.  Chief Complaint:  Chief Complaint  Patient presents with   Follow-up   Depression   Anxiety   Sleeping Problem   Medication Reaction    Depression        Associated symptoms include decreased concentration, fatigue, appetite change and suicidal ideas.  Past medical history includes anxiety.   Anxiety Symptoms include decreased concentration, dizziness, nervous/anxious behavior and suicidal ideas. Patient reports no chest pain or palpitations.      Albert [name] presents to the office today for follow-up of mixed bipolar, anxiety and still grieving stress of breakup.  He requires frequent follow-up because of long-term symptoms.  At visit October 25, 2018.  We made several medicine changes because of his concerns about sleep and other issues.  We increased olanzapine back to 7.5 mg bc not sleeping as well at 5 mg.  He has to have the prescription for quetiapine written for up to 3 tablets at night in case insomnia is worse because insomnia makes his mood disorder so much worse.  We discussed that was above the usual max but the request was granted given his treatment resistant status. We also reduce lithium from 900 mg daily to 750 mg daily to try to reduce tremor and muscle twitches.  At  visit March 28, 2019.  Fluoxetine was increased to 40 mg daily.   Early January 2021 increased to 60 mg daily.  No SE.  seen June 28, 2019.  No further meds were changed except olanzapine was increased back to 10 mg daily to see if if he could get additional benefit per his request.  Covid vaccinated.  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  Then had cholecystectomy.  April 2021 appt with the following noted: 2 episodes night sweats lately.  Memory has been very bad lately.  Repeatedly asks mother questions. Still  tend to stay in his room and his bed.  Still has rapid cycling mood  swings.  Maybe some better with increase in olanzapine to 10 and tolerating itl.  Would like to get out more but can't DT Covid.  Can perform necessary chores.  Will get out of the house when he can.  No change in death thoughts and anxiety in intensity but is better with frequency.  Usually comes and goes in waves but more persistent.  Consistent with meds.  Ativan not helping anxiety very dramatically but he's not sure.  Fidgety.  No trigger other than still grieving relationship and can't get it out of his head.  Poor energy, concentration and more forgetful. In bed more and less active.  Sleep ok lately which is unusual.  Can concentrate on financial matters and stock market at times.  Tolerating meds.  Still some intrusive SI without reason. Less frequent obsessive thoughts about broken relationship and has been doing this for months.  Can't let it go.  Loop.   No unusual stress even with the family who is supportive. Likes the benefit that Zyprexa gives. No sleepwalking nor falling nor odd behavior.  No history of sleep walking.  He understands this may recur with an increase.  Also wants option to rarely take extra quetiapine 300 for sleep prn. Plan:  Try to reduce lorazepam if possible.  10/22/2019 appointment with the following noted: Continues fluoxetine 60, lithium 600 mg, lorazepam 2 mg AM and HS  and 1 mg midday, olanzapine 10, quetiapine 600 mg HS. Still anxious chronically and including driving in crowded spaces.  Doesn't thing Ativan helps as well as Xanax but less cognitive problems. Sleep is not as good.  Occ EFA.  More EMA and wanting to do things in the middle of the night but this is not typical.  Wonders about why that happens.  Some napping.   Tolerating meds.  Asks about weight loss meds.  Disc this in detail.   Plan no changes except OK meclizine prn vertigo.  02/11/20 appt with the following noted: Able to gradually reduce lorazepam to 1 mg AM and HS. More depressed over time  and less interested in things and less interest in going out but does with his parents. Has reduced from 800 to 600 mg HS with some awakening but usually able to go back to sleep.[name]  Spending a good amount of time in bed bc watches TV in bedroom.  Parents watch TV in different part of the house.  No mood swings he notices.   Concerns about weight gain about 200#. Likes olanzapine's benefit for sleep. Plan: Trial for TRD to  Increase fluoxetine to 80 mg daily  to use the combo with olanzapine for TR bipolar depression.   04/21/20 appt with following noted: I thought in beginning some benefit with fluoxetine.  Lifelong negative thinking continues.   Not much bipolar.  Reads a lot on bipolar.   Still tired and anxious a little more may be seasonal.  No familial stressors.  Tends to lay down in afternoon.  Anxiety not over anything in particular.   No SE with fluoxetine. Took meclizine prn.  Easily motion sick.    Asks questions about newer drugs for bipolar like Caplyta. Plan:  No med changes  06/30/20 appt noted: Tired of wearing masks.  Vaccinated.  Asked questions about when this will end with mask mandate.  Mood up and down some since here and getting up 2-3 times.  Disc awakening problems.  Trying to do better bc night eating some.   Mood is about average today so far.  Albert [name] out with friends for breakfast.  Recognizes activity helps mood.   3 days in a row napped a lot in the afternoon.   Bed is a comfort place.  Gets bored and hard to motivate.  Tries to stay away from night eating and spending.   More depressed when alone and wonders about med changes. Plan: Failed response to olanzapine + fluoxetine for TR bipolar depression.   Reduce fluoxetine to 1 daily and reduce olanzapine to one half nightly for 10 days and then stop it. Then start Caplyta 1 daily  08/27/2020 appointment with the following noted: Patient decompensated with the above switch to Caplyta with intrusive suicidal thoughts  and had to be hospitalized for psychiatric reasons.  Multiple phone calls with family members since that time.  Spoke with the psychiatric nurse practitioner with the decision to restart olanzapine in place of the Caplyta given the patient was more stable while on the combination of Seroquel and olanzapine than with the Caplyta. Caplyta triggered HI/SI and depressed and confused on it, and agitated and still has some of it now. No akathisia. Frustrated with lack of therapy at the hospital.   Hydroxyzine didn't help jitteriness.  Feels some better.  Fleeting SI & HI but not obsessive like it was prior to hospitalization.  Feels a little amped up and nervous.  Scared of what could  happen.  Apprehensive being here talking about things. 5-6 hours sleep last night and would like to have more. At mom and dad's house right now and up more in the day. Inadvertently stopped lorazepam abruptly by mistake likely contributing to shakiness. Still some memory issues.  Trying to stay out of bed when watching TV.  Some awakening but managing. Occ fleeting SI and distracts himself.   Always spends a lot of time just laying around.   Can have anxiety for no reason like coming here, and varies in intensity without pattern.      Less panic than in the past.  Some chronic depression and hyperactivity and loudness and hyperverbal.  Usually back to sleep.  Total 6 hours but some napping, awakens 2-4 times nightly but back to sleep..  Taking quetiapine 600 mg HS.  still lacks interest and motivation.  Can follow a TV show if interested. Plan: Restart lorazepam 1 mg in the morning, 1 mg in the afternoon and 1 mg at night for anxiety Stop hydroxyzine  Increase olanzapine to 1 and 1/2 of 10 mg tablets in evening  09/05/2020 appt noted: seen with parents today at his request Made med changes noted and tolerated the changes Better than I did last week. Now only fleeting HI/SI and "no where near what it was".  Sleeping better.   Still not a lot of energy.  Anhedonia.  Less agitation.  A lot of anxiety all the time but it's helping.  Would like more energy and motivation.   Doesn't handle stress well. Parents note he's less shakey.  He's still staying with his parents.  Only driven once since this happened and F wants him to be able to drive and function more independently.  M agrees he's better.  Memory is better but not normal.  Primarily STM problems No akathisia with olanzapine this time.    Administering his own meds but mo watched. Slept 8 hours last night. Plan consider further reductions in quetiapine  10/17/20 appt noted: Stayed on Seroquel 600 HS and olanzapine 15. Olanzapine seemed to do most for the sleep.  Also helping eliminated HI/SI for the most part.  Wants to try to increase it.  Depression is much better with less rumination also.  Fears akathisia at higher dose as in the past. Also on fluoxetine 40.   Plan: Reduce Seroquel to 1 and 1/2 tablets at night Increase olanzapine to 1 of the 15 mg tablet and 1/2 of the 5 mg tablet for 1 week,  Then, if tolerated increase the olanzapine to 20 mg or 1 of the 15 mg tablets and 1 of the 5 mg tablets. If possible then also reduce quetiapine to 1 tablet at night.  11/05/2020 appointment with the following noted: Up to olanzapine 20 mg hs for 3 days.  Reduced Serooquel to 450 mg HS.  Didn't try to go lower. Had hiatal hernia and umbilical hernia surgery recently with no problem.   No akathisia so far. Sleep ok with meds so far changes.  No mood changes yet but overall likes the smoothness of olanzapine and helping sleep without knociing him out. Still some occ HI/SI, he thinks bc of anxiety generally.  Easily anxious. Plan: Continue olanzapine 20 mg nightly longer to give it time to help mood sx reduce quetiapine to 1 tablet at night to minimize polypharmacy and reduce risk of akathisia.  01/07/21 appt noted: Able to reduce quetiapine 300 mg HS ok with decent sleep  but still  wakes a lot.  No AM hangover.  Don't have a lot to do.  Can enjoy going out and doing things.  But doesn't do it longterm.  Family is doing good.  Father started year 25 at his job.   Tolerating the meds well.  May wake more with quetiapine reduction. Lost 10#. Wonders if could try other meds he didn't tolerate before bc now can tolerate olanzapine and couldn't tolerate it in the past DT akathisia. Mood never great but can swing into depresssion without reason.   Plan: Continue olanzapine 20 mg nightly longer to give it time to help mood sx reduce quetiapine to 1/2 of the 300 mg  tablet at night to minimize polypharmacy and reduce risk of akathisia.  02/02/2021 appointment with the following noted: Tried reduced quetiapine to 150 mg and after a week had withdrawal sx including jittery and SI and he increased to 250 mg daily.  Felt better after increasing to 300 mg daily and last week reduced to 250 mg daily.  After increase quetiapine felt better in a few days.  Seems more alert with less quetiapine but can't tolerate a quick withdrawal.   Some awakening.  Plan: Reduce quetiapine by 50 mg every 2-4 weeks.  03/09/2021 phone call: Pt called and said that he is weaning off the seroquel. He was down to 200 mg of the seoquel and he started having withdrawal symptoms and sucidal thoughts. He went back up to 250 mg because he knew he was good on that.    Pt stated he starting back taking 250 mg on Saturday due to the symptoms he was having.Now he is no longer having suicidal thoughts but still very jittery. Albert [name]: He may just need to taper slower than most people.  If he can tolerate the 250 mg Seroquel with the jitteriness it should get better over the next 1 to 2 weeks.  Then he can try going down again by 50 mg at a time.  He probably needs to go down by just 50 mg every 2 to 4 weeks and wait till he fully adjusts to each reduction before he reduces again.  03/17/21 appt noted: Hernia surgery 2  weeks ago. Seroquel 250 mg for a month then 200 mg daily and had SI and increased again to 300 mg daily.  Would love to get off it.  Withdrawal triggers intrusive HI/SI but otherwise doesn't have them No SE. Sleep is OK but not enough REM sleep.  Satisfied with other meds.   No current HI/SI.  No paranoia.  Still has anxiety and taking Ativan every 6 hours.  It works but doesn't cure it. Chronic anxiety and ask about ketamine for chronic depression. Plan: Because he had WD reducing from 250 to 200 mg daily will have to go slowly. Reduce quetiapine by 25 mg every 2-4 weeks. Reduce Seroquel to 250 mg for 2 weeks, Then reduce to 225 mg for 2 weeks, Then reduce to 200 mg for 2 weeks, Then reduce to 175 mg for 2 weeks, Then reduce to 150 mg Also: Buspirone 30 mg tablets for anxiety Start 1/3 tablet twice daily for 1 week Then increase to two thirds twice daily for 1 week Then increase to 1 tablet twice daily  04/15/2021 appointment with the following noted: Reduced Seroquel to 200 mg HS and increased buspirone to 30 mg BID STM px and wants to try to reduce Ativan to see if it is better.  But fears with drawal sx. Parents  complain about his memory. Less HI and SI since here. Less depressed. Plan: Buspirone 30 mg tablets for anxiety Start 1/3 tablet twice daily for 1 week Then increase to two thirds twice daily for 1 week Then increase to 1 tablet twice daily Continue olanzapine 20 mg nightly longer to give it time to help mood sx Because he had WD reducing from 250 to 200 mg daily will have to go slowly. Reduce quetiapine by 25 mg every 2-4 weeks. Reduce Seroquel to 250 mg for 2 weeks, Then reduce to 225 mg for 2 weeks, Then reduce to 200 mg for 2 weeks, Then reduce to 175 mg for 2 weeks, Then reduce to 150 mg Trial taper lorazepam: In hopes of improving short-term memory  04/30/2022 phone call: Complaining of withdrawal symptoms reducing lorazepam from 0.5 mg 5 times a day to 0.5  mg 4 times daily.  05/21/2021 appointment with the following noted: Gotten onto buspar 30 BID and down on lorazepam to 0.5 mg QID. Gotten down to Seroquel 200 mg HS A little restless every now and then but able to reduce lorazepam for the last weeks. Is too inactive and lays in bed too much watching TV.   Can' t tell effect of buspirone bc of other changes.  Sleep with awakening with 6-7 hours. Fleeting SI without trigger or plan or intent. Depressed more than anxious. Enjoys watching stocks and investing. Plan: Trial taper lorazepam gradually over time In hopes of improving short-term memory But for now continue lorazepam 0.5 mg QID Start clonidine 0.1 mg tablets one half at night for 3 nights, then 1/2 tablet twice daily for 3 nights, then one half in the morning and 1 tablet at night If clonidine does not noticeably help anxiety call the office  05/27/2021 phone call initially complaining of homicidal and suicidal thoughts after taking 1 mg of clonidine.  He was told to stop the medication.  But then called back stating he was having homicidal and suicidal thoughts from withdrawal from the medication which was not logical.  He agreed it was not logical and stated on 05/29/2021 that he felt better  06/23/2021 appointment the following noted: No clonidine. Sunday panic attack eating out and then having SI.  Stayed with father to feel safer. Intrusive thoughts of hurting parents or others.  HI only when has SI.  Usually panic without SI.  Then had intrusive thoughts about hurting others.   Usually intrusive thoughts are brief and fleeting.    07/20/21  appt noted; More anxious with less lithium.  More anxious in his body.  Not more depressed or irritable.  Sleep is the same.  More racing thoughts.  Chronic SI probably a litte worse but not unmanageable. Rduced lithium last week to 300 mg daily. No recent change in lisinopril. Off the clonidine for a week or so. Doesn't drink much water.   Drinks a lot of diet coke. No marked akathisia or RLS but does shake his leg DT anxiety. Worries about needing mor emedicine. Plan: Increase olanzapine to 25 mg nightly bc more racing thoughts and anxiety with less lithium DT severity of sx and difficulty getting off Seroquel conside rincrease.  07/30/21 TC : CO racing SI today and wonders about whether increased sx racing thoughts, hyperactivity, SI related to Reduced lithium from 600 mg daily to 300 mg daily recently DT lithium level 1.8.  could be related.    Plan: increase olanzapine 30 mg pm Today take lithium 600 mg now.  Tomorrow start lithium CR 450 mg daily Continue olanzapine 30 mg PM until SI resolves unless akathisia returns.  Albert Staggers, Albert [name], Albert [name]  08/17/21 appt noted:  No akathisia with olanzapine 30 mg pm (about 3 weeks) and is sleeping better. Increased lithium to 450 mg daily. Tolerating meds. Still racing negative thoughts maybe some better.  Asked about it. SI come and go.   Anxiety about the same as depression.  No motivation unless has to do something. Plan: Reduce  fluoxetine 20 mg 1 daily in hopes racing thoughts and anxiety are better.  09/14/2021 appointment with the following noted: No difference noted with reduction fluoxetine 20 mg daily. Most of the time negative thoughts, ex "what if I get in a wreck?"  Frequent negative thoughts.   Still on lorazepam 0.5 mg TID.   Wonders about trying prior meds that caused akathisia bc no longer gets it with olanzapine 30 mg daily. Sleep is usually ok.  Taking Seroquel 200 mg HS.  Thinks the olanzapine helps his sleep and doesn't want to stop it. Panic 1-2 times per month.  Then takes extra lorazepam. Plan: DC fluoxetine 20 mg 1 daily  Trial Auvelity off label for depression 1 in the AM for 1 week then 1 in AM and 1 with evening meal  10/15/2021 appointment with the following noted: Occ of getting confused as to day and went to church on a Thursday my mistake.   Had it  happen a few months ago too.  Memory is terrible. Thinks he couldn't tolerate more Auvelity. Not a lot of benefit from [place] 1 daily. Still quite a bit of anxiety.   A little drowsy  and sleeps 7-8 hours at night. Word finding problems.  Lost wallet. Plan: DC  Auvelity  For [facility]: memantine 1/2 tablet in the AM for 1 week, then 1/2 tablet twice daily, then 1/2 tablet in the AM and 1 tablet at night for 1 week then 1 tablet twice daily  10/22/2021 phone call complaining of additional stress asking to start new medication because of anxiety and more suicidal thoughts without intent or plan.  Reports taking more than 4 prescribed Ativan a day recently.  Asking to increase olanzapine which is already at 30 mg daily. Albert [name] response:We cannot go any higher in the dosage of olanzapine that he is currently taking.  A lot of these suicidal thoughts are apparently anxiety driven.  Let us have him resume taking paroxetine which is one of the more effective medicines for anxiety.  I will send it in and the instructions will be paroxetine 20 mg tablets 1/2 tablet daily for 1 week then 1 tablet daily  11/16/21 appt noted: Multiple phone calls since she was here.  Complaining of suicidal thoughts from memantine which was stopped. Started paroxetine and up to 20 mg daily.  Continues olanzapine 30 mg, lithium 450 mg nightly, lorazepam 0.5 mg 4 times daily , And quetiapine 200 mg nightly. Questions about whether memantine caused the neg SI really bc has them at times. When has SI often anxiety driven.  Will tend to ruminate on past relationship ended.   Edgy and irritable all the time but memantine seemed to make it worse. Mind is getting better with less SI lately.   No SE with paroxetine.   Aggrivated by forgetfulness.  eXample, talked with someone about going to breakfast last night and then forgot to go this mornin.g. Plan prescribed donepezil 5 mg daily for cognitive complaints specifically  forgetfulness  12/31/2021 appointment with the following noted: He had been prescribed donepezil 5 mg for cognitive complaints.  He called back reporting he felt it was causing homicidal thoughts and was instructed to stop it.  He has had these types of thoughts in the past.  He had no desire to act on them. Discouraged. Ongoing depression and reduced enjoyment and negative thoughts.  Can enjoy some things. A lot of anxiety chronically.  Trying to limit Ativan to 5 of 0.5 mg daily.  Sometimes needs 2. Constipation for years.  02/03/22 appt noted: Depression makes LBP worse. Ongoing chronic depression. Tolerated rivastigmine without triggering SI Chronic anxiety and Ativan helps some. Occ panic but not usual.  Asks if any other meds coud be used. Plan: But for now continue lorazepam 0.5 mg QID, but use LED for cog reasons Increase rivastigmine to 3 mg capsule 1 twice daily or 2 of the 1.5 mg capsules twice daily.  Call if you have problems with nausea Start dextromethorphan 1 capsule twice daily . Thi sis off label for depression based on MOA of Auvelity and use of paroxetine to prolong half life of DM. Presence of paroxetine may have been reason he didn't tolerate Auvelity brief trial before.  Will proceed slowly.  03/17/2022 appointment noted: Current psych meds: Rivastigmine 3 mg twice daily, quetiapine 200 mg nightly, paroxetine 40 mg daily, olanzapine 30 mg nightly, Namenda 10 mg daily, lorazepam 0.5 mg 4 times daily, lithium CR 450 mg nightly, dextromethorphan 15 mg twice daily A lot of anxiety since here.  Some days taken more lorazepam to 6-7 tablets daily.  No major triggers but anxious even about coming here and other normal things.  Cancelled trip to collect rocks with friend and cancelled the trip bc anxious.  Usually if can go he is ok.  Lots of SI ongoing Memory might be a little better. Asks how he responded to [employer].  Will check paper chart.  03/24/22 TC:  CO SI worse and  restilless with Rivastigmine 9 mg . Albert [name] response:  Stop the rivastigmine since the [place] appears to be having side effects from it.  He was having anxiety at the last appointment 2 and then we increased the dose.  This might be causing the anxiety to be worse. He has chronic suicidal thoughts without intent or plan.  However it appears the thoughts are more intense with the rivastigmine     05/06/21 appt noted: Stopped rivastigmine DT anxiety and worsening SI.  Was better the next day. This week had some SI.  Thinking about the prior relationship tends to lead to SI. Will take Ativan when gets SI and it helps a little at 0.5 mg at a time. Plan: Consider fluvoxamine.  Yes this may help with obsessions on old GF and he failed other optioons. Start fluvoxamine 1/2 of 50 mg tablet and reduce paroxetine to 1 and 1/2 of 20 mg tablets for 5 days, Then increase fluvoxamine to 1 tablet daily and reduce paroxetine to 1 tablet daily for 5 days, Then increase fluvoxamine to 1-1/2 tablets daily and reduce paroxetine to 1/2 tablet daily for 5 days, Then increase fluvoxamine to 2 tablets daily and stop paroxetine   06/07/22 appt noted: Feels like the fluvoxamine 50 BID is too much.  On this a couple of weeks. Worries he might be worse with this with regard to SI.    He thinks it's worse with increase.   New neighbor who has a young boy. He worries that  Reginal Lutes is triggering depression and intrusive SI/HI a couple of days later and lasts a little while. Has had anxiety turning into panic attacks.  This gets associated with depression and SI/HI.  These thoughts are intrusive and he doesn't want to act on them.  Can have HI even towards family with whom he has no negative emotions.  Little things like a bump up in parking lot sets off his depression and anxiety and doesn't seem to handle things.   No sig akathisia.  In the past it's been continuous.   Awakens every 2-3 hours.  Usually goes back to sleep with  meds. Has not had alcohol in 20 years and had some intrusive thoughts without drinking.   Plan: As soon as he feels comfortable increase fluvoxamine to 50 mg AM and 100 mg PM for intrusive thoughts of HI/SI and obs on GF  07/06/22 appt noted: Increased fluvoxamine to 150 mg daily and felt more agitated and some SI so reduced to 100 mg 4 days ago and feels better so far.  Likes the fact it is good for anxiety and obs thoughts.  Wonders if there is a similar med.  Frustrated he can't get relief.  Some improvement in obs on exGF but not resolved. Continue other meds: lithium CR 450 mg HS, lorazepam 0.5 mg QID, Memantine 10 BID, olanzapine 30 mg HS, quetiapine 200 mg HS Tingling and burning in legs at night but doesn't interfere with sleep. Got insurance to pay for [facility] and lost 13#.   No full panic lately. But some anxiety waves.   Still major problems with memory. Plan: Wean fluvoxamine 50 mg daily for a month then 1/2 daily for 2 weeks then stop it DT  NR.  Watch for more anxiety.  07/28/22 TC: Franchot Erichsen, CMA  to Me     07/28/22  4:16 PM Note Mom called reporting patient has been confused, dizzy, and sluggish for the last 3 days. He is now staying with her. She had several questions in regards to this:   Lithium level - she feels he needs one done, last result in Epic was 08/2021 that I see. She would like order sent to [facility] for tomorrow.   Patient is on Wegovy, has been for about a month, and she wonders if this could cause sx.   Patient has meclizine and she is questioning if she should give that for his dizziness considering other sx.   She questions if perhaps he got up during the night and took additional medications, though doesn't know if this happened and if so what he took.    On 3/5 visit he was to wean fluvoxamine to 50 mg qd for a month and then 1/2 tab for 2 weeks and then stop. Mom and pt unsure if he started the wean.    He has F/U 4/4.        Me  to Franchot Erichsen, CMA   CC   07/28/22  5:44 PM Note Yes get lithium level in the AM and don't take any lithium tonight or in the morning before the blood test.     Reginal Lutes should not cause these sx except if he is losing wt quickly or is dehydrated it can cause lithium toxicity and include these sx.  He needs to drink lots of water.   He should go ahead and wean the fluvoxamine as instructed in the last appt.   If the sx get worse, go  to the ER.    Me   CC   07/29/22  6:49 PM Note RTC   Sx not as severe as when he had to go to hospital in the past.  Albert [name] got Lithium level today.   Speech is not good .  Confused at times, but better today.  A little tremor .  A little balance problems.  No falls.  No diarrhea Took lihtium 450 last night. Mo says he got up in the middle of the night and moved things around last night and he doesn't rmember.   No change in night time meds.   Gradually reducing fluvoxamine.   No extra lorazepam.     Spoke with mother who verifies the same.   No med changes but push fluids.   Albert Staggers, Albert [name], Albert [name]      08/05/22 appt : with parents Pt hosp 08/02/22 with acute kidney injury and somewhat elevated lithium level with some confusion.  Spoke with psych NP at hosp and lithium was decreased to 450 mg HS. No pending appt known with medical doctors about kidney function. Don't feel good and having HI and SI and feels he needs lithium for these issues.  Doesn't feel he should have been discharged.   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 No added meds at night but has been confused at night and getting up multiple times and doing things he doesn't remember.  This concerns parents.  Parents been up at night with him and he doesn't make sense.  Still groggy in the am per mo for an hour.   Plan: Plan: Get lithium level and kidney test ASAP Stop fluvoxamine Reduce lithium 300 mg capsule 1 at night Monday night: reduce to olanzapine to 20 mg at night (stop the 10 mg tablet) and  start clozapine prescription. Monday 4/15 : increase clozapine to 100 mg at night and reduce quetiapine to 1/2 of 200 mg tablet and continue olanzapine 20 mg each evening.  08/01/22 & 08/11/22 ED for HI and SI  08/12/22 TC mother:  Mother, Patsy called. Per note made by Larinda Buttery. He was referred to [facility].Reporting that he did go up there last night. This morning they called her and told her to come pick him up. They told her he wasn't having homicidal or suicidal thoughts and it would be four to five days of him waiting to go to a facility in [place]. They recommended him for PHP to start 4/16. She picked him up today. He is worried about these thoughts coming back up. Also, [facility] came out yesterday to assess him and offered counseling from [facility]. He wants to do counseling with them.  Any input on care he can receive?  He is so discouraged and wants to be safe.  (She notes that the [facility] gave him- Zyprexa 10mg  at 1:07am and again at 1:25am per the med list- based on his previous med list) He was supposed to be off of Zyprexa10mg . Has been on  Clozapine since Monday. He did go ahead and take it before he left ( 2pills) so he hasn't missed a dose and did go get labwork done. Is wanting to continue the Clozapine, please check the labwork to be able to fill the 100mg  dose.    Albert [name] resp:  Continue all meds as RX except go ahead and increase clozapine to 100 mg tablets, 1 each night as soon as he can get the 100 mg tablets from pharmacy.  RX sent.  Trying  to speed up his chance of response by getting it to a hgiher dose.  Clozapine is very effective for HI/SI.  If he gets worsening HI/SI in the interim it will not be caused by meds but just the natural waxing and waining of his sx.  Once clozapine dose gets high enough it should help but avg patient needs 300 mg so we have to just be patient.      08/20/22 appt:  Up to 100 mg clozapine HS SE constipation and using Colace, Miralax.  Been to [facility] twice  since here.    Ongoing continuous HI/SI.   All I want to do is go to sleep to escape.  Dep causing the thoughts.   Taking Ativan 0.5 mg BID-QID.  He thinks mother not giving him enough of it.  He thinks anxiety triggers HI/SI.  He fears HI and hopes he's strong enough to reisist.  Not angry or wanting to hurt anyone in reality but has the intrusive thoughts. Conc and memory still not good.   Sleep not much different.   Staying with parents currently. Dep gotten worse M worrying he's dependent on lorazepam.   Confusion at night is no longer happening.  Not wandering at night.  Plan: For severe constipation start Linzess 145 mg AM Get lithium level and kidney test ASAP Stopped fluvoxamine and confusion at night is gone  lithium 300 mg capsule 1 at night  olanzapine to 20 mg at night  increase clozapine to 150 mg at night for 3 nights then 200 mg HS   olanzapine 20 mg each evening. Continue lorazepam 0.5 mg QID, needs this and informed mother  09/07/22 appt noted: Psych meds: clozapine 250 MG HS, lithium 300 mg HS, lorazepam 0.5 mg BID-QID, olanzapine 20 .  Off Seroquel, linzess 290 mg daily, Miralax, Colace. Sleep 7-8 hours without change from before clozapine.   Mood is still very depressed and still gets anxiety. Still problems with constipation.   Dep is so much it is causing intrusive HI/SI.   No more bizarre behaviors at night.   Plan: Plan: Get lithium level and kidney test ASAP  lithium 300 mg capsule 1 at night  olanzapine to 20 mg at night  increase clozapine to  300 mg HS as soon as tolerated and then 400 mg HS.  olanzapine 20 mg each evening. Continue lorazepam 0.5 mg QID, needs this and informed mother  10/07/22 appt noted: alone and with parents Rough month.  Altered voice for a couple of weeks or so..  Mind not good.  Feel out of sorts.  Jittery.  Constipation.  No appetite.  On Wegovy.  Saw GI doc.   May need to reduce Wegovy bc poor appetite and constipation.  Lost 25 #. Current psych  med: clozapine 400 mg HS about a week or 2, olanzapine 20 mg HS, lithium 300 mg HS, lorazepam 0.5 mg QID. Reduced intrusive thoughts.  2 nights since here night eating.   Sleep 6 hours.  Limited napping during the day.   Anhedonia.  Lower motivation.  Tremor worse. M says he sleeps pretty well but awakens  BP is under control. Plan Reduce lithium to 150 mg capsule 1 at night DT level higher Reduce  olanzapine to 10 mg at night  Continue clozapine 400 mg HS bc helping SI and HI and  Continue lorazepam 0.5 mg QID, needs this and informed mother  11/08/22 appt noted:  alone and with parents. Ongoing calls between visits Doesn't  think clozapine helping him sleep as well as olanzapine for sleep or any other benefit. While at restaurant had intrusive HI/SI. Staying with parents seems to provoke intrusive HI.  Is grateful to them.  Sat woke up with them.  Sometimes goes away and sometimes not.  No know trigger and no intent. Saw PCP.  Off Wegovy.  Started metformin. No effect noted from clozapine.   M limits his lorazepam.  PCP referring him to neuro to FU abnormal head scan.  He wonders if his speech and memory problems are related.   Plan: Cont clozapine 400 mg HS Check clozapine level and then direct dosing.  11/16/22 TC from pt to disc level:  Albert [name] resp:  Clozapine level ok but may benefit with increase to 500 mg nightly.  Tell him to increase with current supply.     12/06/22 TC from pt:  complaining of worsening intrusive ego-dystonic HI/SI without intent, desire or plan.   Albert [name] resp ok to increase clozapine to 600 mg HS.  12/09/22 appt noted: Complaining of ongoing racing thoughts and worsening HI/SI without intent or desire or plan.  Racing thoughts chronic and other thoughts wax and wane.  Usually triggered if starts thinking of something negative Meds: clozapine 600 mg HS, lithium 150, lorazepam 0.5 mg QID, olanzapine 10 HS Sleep with EFA.   Will have trouble going to sleep sometimes with  thoughts. Doesn't think clozapine makes him sleepy.  Not really drowsy daytime. No dx OSA and no known apnea.  SE a little dizzy.  Constipation managed with BM every 2-3 days. Better than before.  PCP RX metformin and it helped. Plan: Plan for intrusive HI/SI wean olanzapine and trial of risperidone.  Reduce olanzapine to 5 mg HS for 1 week then stop it.  Addressed his fears of not sleeping off olanzapine. Then if sleeping start risperidone.  01/11/23 appt noted:  also seen with parents Psych meds: clozapine 600, lorazepam 0.5 mg QID, No olanzpaine ,  lithium 150 mg daily, no risperidone yet. Lost 45# on Wegovy down to 150#.   Still having intrusive HI/SI as noted before. Sleep no worse.  Takes a couple of hours after clozapine to sleep.  EFA 2-3 times nocturia.   Still racing thoughts.  Ongoing px with conc, memory,  planning Scored 73 on 100 pt test for cognition.   Some trouble with articulation and speech at times.   "If could get rid of racing and intrusive thoughts I'd be 100% better." Neuro says no stroke history and low B12 and workup ongoing at [facility], Dr. Donia Pounds. Memory is still terrible.    Psych med hx extensive including ECT and  risperidone,  Zyprexa 30 Latuda 80 which caused akathisia,  Vraylar, Rexulti, aripiprazole 20 mg with akathisia,  Seroquel 1000 mg,  InVega, Geodon, Saphris with side effects, symbyax, Fanapt NR .  Caplyta SE and markedly worse. Clozapine started end of March lithium 1200 SE,  lamotrigine 300 mg, Depakote 2000 mg, Tegretol, Trileptal and several of these in combinations, gabapentin,  N-acetylcysteine, Nuedexta,     Belsomra with no response,  Lunesta no response, trazodone 200 mg,  Xanax, clonazepam, lorazepam less sedation. Buspirone NR Clonidine SE with 2 trials  Viibryd 40 mg for 3 months with diarrhea,  protriptyline with side effects,  Trintellix 20 mg,  Parnate 50 mg with no response,   Emsam 12 mg for 2 months,   imipramine,  venlafaxine,   bupropion was side effects,  Lexapro 20 mg, sertraline,  paroxetine, Deplin, fluoxetine 80,  fluvoxamine 150 agitated.  Confusion when with clozapine. Auvelity NR at one daily.  SE BID  methylphenidate 60 mg,  Vyvanse, Concerta, strattera, , modafinil,  Memantine worsening SI? Donepezil, complained of HI, Exelon 3 BID rivastigmine DT worsening SI  pramipexole,  amantadine ,  Patient prone to akathisia.  [facility]   Review of Systems:  Review of Systems  Constitutional:  Positive for appetite change and fatigue.  Cardiovascular:  Negative for chest pain and palpitations.  Gastrointestinal:  Positive for constipation.  Musculoskeletal:  Positive for back pain.  Neurological:  Positive for dizziness and tremors.       Fidgety  Psychiatric/Behavioral:  Positive for decreased concentration, depression, dysphoric mood and suicidal ideas. Negative for agitation, behavioral problems, hallucinations, self-injury and sleep disturbance. The patient is nervous/anxious. The patient is not hyperactive.     Medications: I have reviewed the patient's current medications.  Current Outpatient Medications  Medication Sig Dispense Refill   acetaminophen (TYLENOL) 500 MG tablet Take 1,000 mg by mouth every 6 (six) hours as needed for moderate pain.     bisacodyl (DULCOLAX) 10 MG suppository Place 1 suppository (10 mg total) rectally as needed for moderate constipation. 12 suppository 0   esomeprazole (NEXIUM) 40 MG capsule Take 40 mg by mouth daily.     fenofibrate (TRICOR) 145 MG tablet Take 145 mg by mouth daily.     fluticasone (FLONASE) 50 MCG/ACT nasal spray Place 1 spray into both nostrils daily as needed for allergies or rhinitis.     hydrocortisone (ANUSOL-HC) 2.5 % rectal cream Place 1 application rectally 3 (three) times daily as needed for hemorrhoids.     levothyroxine (SYNTHROID, LEVOTHROID) 75 MCG tablet Take 75 mcg by mouth daily before breakfast.      lisinopril (ZESTRIL) 5 MG tablet Take 5 mg by mouth daily.     lithium carbonate 150 MG capsule TAKE 1 CAPSULE BY MOUTH AT BEDTIME 30 capsule 0   LORazepam (ATIVAN) 0.5 MG tablet TAKE ONE TABLET BY MOUTH FOUR TIMES DAILY 120 tablet 1   lubiprostone (AMITIZA) 24 MCG capsule Take 24 mcg by mouth daily with breakfast.     meclizine (ANTIVERT) 25 MG tablet Take 1 tablet (25 mg total) by mouth 3 (three) times daily as needed for dizziness. 30 tablet 1   polycarbophil (FIBERCON) 625 MG tablet Take 1,250 mg by mouth daily.     risperiDONE (RISPERDAL) 2 MG tablet Take 1 tablet (2 mg total) by mouth at bedtime. 30 tablet 1   sildenafil (VIAGRA) 100 MG tablet Take 1 tablet (100 mg total) by mouth daily as needed for erectile dysfunction. 30 tablet 6   tamsulosin (FLOMAX) 0.4 MG CAPS capsule Take 1 capsule (0.4 mg total) by mouth daily. 90 capsule 3   WEGOVY 1 MG/0.5ML SOAJ Inject 1 mg into the skin once a week.     cloZAPine (CLOZARIL) 100 MG tablet Take 6 tablets (600 mg total) by mouth at bedtime. 42 tablet 8   No current facility-administered medications for this visit.   Medication Side Effects: Other: mild sleepiness.   Occ twitches.\, tremor  Allergies:  Allergies  Allergen Reactions   Meloxicam Other (See Comments)    Dizziness   Nsaids Other (See Comments)    Hallucinations   Ibuprofen Other (See Comments)    Can not take because taking lithium   Prednisone Other (See Comments)    Can't sleep    Wellbutrin [Bupropion] Other (See Comments)  Suicidal thoughts    Past Medical History:  Diagnosis Date   ADHD (attention deficit hyperactivity disorder)    Anemia    Anxiety    Bipolar disorder (HCC)    BPH (benign prostatic hyperplasia)    Chronic kidney disease, stage 3b (HCC)    Constipation    DDD (degenerative disc disease), cervical    Depression    Deviated septum    ED (erectile dysfunction)    GERD (gastroesophageal reflux disease)    Graves disease    History of hiatal  hernia    Hypertension    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    Hypothyroidism    Pneumonia    PONV (postoperative nausea and vomiting)     Family History  Problem Relation Age of Onset   Prostate cancer Neg Hx    Bladder Cancer Neg Hx    Kidney cancer Neg Hx     Social History   Socioeconomic History   Marital status: Single    Spouse name: Not on file   Number of children: Not on file   Years of education: Not on file   Highest education level: Not on file  Occupational History   Not on file  Tobacco Use   Smoking status: Former    Types: Cigarettes    Passive exposure: Past   Smokeless tobacco: Former    Types: Engineer, drilling   Vaping status: Never Used  Substance and Sexual Activity   Alcohol use: Not Currently   Drug use: Never   Sexual activity: Yes  Other Topics Concern   Not on file  Social History Narrative   Not on file   Social Determinants of Health   Financial Resource Strain: Low Risk  (11/03/2022)   Received from [facility], [facility]   Overall Financial Resource Strain (CARDIA)    Difficulty of Paying Living Expenses: Not very hard  Food Insecurity: No Food Insecurity (11/03/2022)   Received from [facility], [facility]   Hunger Vital Sign    Worried About Running Out of Food in the Last Year: Never true    Ran Out of Food in the Last Year: Never true  Transportation Needs: No Transportation Needs (11/03/2022)   Received from [facility], [facility]   [facility] - Transportation    In the past 12 months, has lack of transportation kept you from medical appointments or from getting medications?: No    Lack of Transportation (Non-Medical): No  Physical Activity: Not on file  Stress: Stress Concern Present (08/12/2020)   Received from [facility] - Occupational Stress  Questionnaire    Feeling of Stress : Very much  Social Connections: Unknown (09/01/2021)   Received from [facility]   Social Network    Social Network: Not on file  Intimate Partner Violence: Unknown (08/06/2021)   Received from [facility]   HITS    Physically Hurt: Not on file    Insult or Talk Down To: Not on file    Threaten Physical Harm: Not on file    Scream or Curse: Not on file    Past Medical History, Surgical history, Social history, and Family history were reviewed and updated as appropriate.   Please see review of systems for further details on the patient's review from today.   Objective:   Physical Exam:  There  were no vitals taken for this visit.  Physical Exam Constitutional:      General: He is not in acute distress.    Appearance: He is well-developed.  Musculoskeletal:        General: No deformity.  Neurological:     Mental Status: He is alert and oriented to person, place, and time.     Cranial Nerves: No dysarthria.     Motor: Tremor present.     Coordination: Coordination normal.     Comments: mild tremor  Psychiatric:        Attention and Perception: Attention and perception normal. He does not perceive auditory or visual hallucinations.        Mood and Affect: Mood is anxious and depressed. Affect is not labile, blunt, angry or tearful.        Speech: Speech normal. Speech is not slurred.        Behavior: Behavior normal. Behavior is not slowed. Behavior is cooperative.        Thought Content: Thought content is not delusional. Thought content includes homicidal and suicidal ideation. Thought content does not include suicidal plan.        Cognition and Memory: Cognition normal. He exhibits impaired recent memory.        Judgment: Judgment normal.     Comments: Insight fair Chronically talkative .directable. But chronically mildly pressured. He is chronically anxious   No manic signs noted. Some wordfinding problems ongoing. Intrusive thoughts  episodic HI/SI  Alert and oriented.      Lab Review:     Component Value Date/Time   NA 135 10/20/2022 1123   NA 142 03/20/2014 0514   K 5.2 10/20/2022 1123   K 4.1 03/20/2014 0514   CL 101 10/20/2022 1123   CL 112 (H) 03/20/2014 0514   CO2 20 10/20/2022 1123   CO2 26 03/20/2014 0514   GLUCOSE 150 (H) 10/20/2022 1123   GLUCOSE 101 (H) 08/12/2022 0029   GLUCOSE 99 03/20/2014 0514   BUN 22 10/20/2022 1123   BUN 7 03/20/2014 0514   CREATININE 1.68 (H) 10/20/2022 1123   CREATININE 0.78 03/20/2014 0514   CALCIUM 9.8 10/20/2022 1123   CALCIUM 9.5 03/20/2014 0514   PROT 6.7 08/12/2022 0029   PROT 7.3 03/18/2014 1459   ALBUMIN 4.2 08/12/2022 0029   ALBUMIN 3.2 (L) 03/18/2014 1459   AST 18 08/12/2022 0029   AST 16 03/18/2014 1459   ALT 14 08/12/2022 0029   ALT 16 03/18/2014 1459   ALKPHOS 24 (L) 08/12/2022 0029   ALKPHOS 105 03/18/2014 1459   BILITOT 0.5 08/12/2022 0029   BILITOT 0.3 03/18/2014 1459   GFRNONAA 42 (L) 08/12/2022 0029   GFRNONAA >60 03/20/2014 0514   GFRAA 54 (L) 04/18/2019 1612   GFRAA >60 03/20/2014 0514       Component Value Date/Time   WBC 4.8 01/05/2023 1121   WBC 6.8 08/12/2022 0029   RBC 4.32 01/05/2023 1121   RBC 3.72 (L) 08/12/2022 0029   HGB 13.3 01/05/2023 1121   HCT 39.7 01/05/2023 1121   PLT 488 (H) 01/05/2023 1121   MCV 92 01/05/2023 1121   MCV 89 03/20/2014 0514   MCH 30.8 01/05/2023 1121   MCH 31.5 08/12/2022 0029   MCHC 33.5 01/05/2023 1121   MCHC 33.4 08/12/2022 0029   RDW 12.1 01/05/2023 1121   RDW 12.5 03/20/2014 0514   LYMPHSABS 1.6 01/05/2023 1121   LYMPHSABS 3.2 03/20/2014 0514   MONOABS 0.5 08/12/2022 0029  MONOABS 1.0 03/20/2014 0514   EOSABS 0.3 01/05/2023 1121   EOSABS 0.4 03/20/2014 0514   BASOSABS 0.1 01/05/2023 1121   BASOSABS 0.1 03/20/2014 0514    Lithium Lvl  Date Value Ref Range Status  10/20/2022 0.4 (L) 0.5 - 1.2 mmol/L Final    Comment:    A concentration of 0.5-0.8 mmol/L is advised for long-term  use; concentrations of up to 1.2 mmol/L may be necessary during acute treatment.                                  Detection Limit = 0.1                           <0.1 indicates None Detected    08/12/21 lithium level 0.9 on 450 mg.  lithium level Sept 0.8.   lithium level July 27, 2018 was normal at 1.0.   Lithium level [facility] October 03, 2018 = 1.2. Said he got lithium level at [facility] as requested.  Labs not in Epic.  Recent lipids ok except higher TG than usual.  Normal A1C.  11/10/22 Checked clozapine level on 400 mg HS= cloz 733+norclozapine 294 = total 1027  .res Assessment: Plan:    Albert [name] was seen today for follow-up, depression, anxiety, sleeping problem and medication reaction.  Diagnoses and all orders for this visit:  Severe bipolar I disorder, current or most recent episode depressed (HCC) -     risperiDONE (RISPERDAL) 2 MG tablet; Take 1 tablet (2 mg total) by mouth at bedtime. -     cloZAPine (CLOZARIL) 100 MG tablet; Take 6 tablets (600 mg total) by mouth at bedtime.  GAD (generalized anxiety disorder)  Panic disorder with agoraphobia  Drug-induced constipation  Insomnia due to mental condition  Lithium use  Long term current use of clozapine  Low serum vitamin B12    60 min appt with pt  needs a lot of time and gets parents involved in session   Chronic TR bipolar mixed and chronic anxiety.  He usually has mixed bipolar symptoms which we have not been able to completely eliminate.  See long list of meds tried.   He is  chronically unstable  Chronic frequent ph calls between appts often about the same issue repeatedly most recently re: intrusive ego dystonic thoughts HI/SI.  More like obsessions than true HI/SI.   Recent hosp 08/02/22 with SI and acute kidney injury and elevated lithium. Processed this and disc goal of trying to stop lithium but risk of worsening SI.  He has chronic SI and there's risk of this worsening off lithium.  Lithium and clozapine are  the most effective meds for SI.  Possible that [facility] made him dehydrated and constipation and caused incr lithium level.  Had 15-20 # wt loss.  We discussed the short-term risks associated with benzodiazepines including sedation and increased fall risk among others.  Discussed long-term side effect risk including dependence, potential withdrawal symptoms, and the potential eventual dose-related risk of dementia.  But recent studies from 2020 dispute this association between benzodiazepines and dementia risk. Newer studies in 2020 do not support an association with dementia.  Unfortunately due to the severity of set his symptoms polypharmacy is a necessity.   No changes off olanzapine.   Lithium being used bc chronic SI and death thoughts.  Counseled patient regarding potential benefits, risks, and side effects of  lithium to include potential risk of lithium affecting thyroid and renal function.  Discussed need for periodic lab monitoring to determine drug level and to assess for potential adverse effects.  Counseled patient regarding signs and symptoms of lithium toxicity and advised that they notify office immediately or seek urgent medical attention if experiencing these signs and symptoms.  Patient advised to contact office with any questions or concerns.  Reduced lithium to 150 mg daily Dt hx toxicity Checked lithium level and BMP and level 1.1 on 300 mg Daily so reduced again to 150 mg daily and level 0.4 Call if death thoughts worsen or worsening SI.  Disc clozapine and it's level..  Disc data showing less SI with it but he's had no changes.    Disc risk in detail including low WBC with complication, myocarditis.  Extensive discussion of CBC monitoring.   Disc this again bc taking Wegovy might help him to tolerate it better.  He's started [facility] and lost 25 #. Cont clozapine 600 mg HS.  Started 12/07/22. Started clozapine 08/05/22. 11/10/22 Checked clozapine level on 400 mg HS= cloz  733+norclozapine 294 = total 1027 Defer increase.  Did he take duloxetine?  Might help depression more.  Consider retry pramipexole off label   But for now continue lorazepam 0.5 mg QID prn and lately BID-TID prn,  but use LED for cog reasons M administers  For MCI had to stop rivastigmine DT worsening SI. Neuro [facility] Dr. Donia Pounds, [facility] in progress.    We discussed the short-term risks associated with benzodiazepines including sedation and increased fall risk among others.  Discussed long-term side effect risk including dependence, potential withdrawal symptoms, and the potential eventual dose-related risk of dementia.  But recent studies from 2020 dispute this association between benzodiazepines and dementia risk. Newer studies in 2020 do not support an association with dementia.  Discussed safety plan at length with patient.  Advised patient to contact office with any worsening signs and symptoms.  Instructed patient to go to the [facility] emergency room for evaluation if experiencing any acute safety concerns, to include suicidal intent.  He commits to safety.  Disc Ozempic and Monjauro for weight loss.  Has had weight gain from meds as a contributor. Lost 45# with Wegovy and now 150#.  Needs to keep up activity as much as possible for depression.  For severe constipation seeing GI Will not make further changes to address this. Had constipation before clozapine and GI appt in June started Amitza which helped some. Needs to push fluids.    Historically he has not done well with serotonergic medicines which would typically be used for obsessive thoughts.  Therefore consider a stronger dopamine antagonist in place of olanzapine.   BC blood level of clozapine reluctant to increase unless we have to so ... Plan for intrusive HI/SI wean olanzapine and trial of risperidone.  Sleep ok with less olanazpine 5 mg so stopped it without a probvlem. start risperidone 2 mg HS.  AVS: Start risperidone  2 mg tablet, 1 at night for racing thoughts and intrusive homicidal and suicidal thoughts. Call in 3 weeks if not better and we will increase the dose Explained plan   FU 4 weeks   Albert Staggers, Albert [name], Albert [name]  Future Appointments  Date Time Provider Department Center  01/12/2023 11:45 AM Dreama Saa, Happi, CCC-SLP [facility] None  01/17/2023 11:45 AM Overton, Happi, CCC-SLP [facility] None  01/19/2023 11:45 AM Overton, Happi, CCC-SLP [facility] None  01/25/2023 10:15 AM Dreama Saa, Happi,  CCC-SLP [facility] None  01/27/2023 11:45 AM Overton, Happi, CCC-SLP [facility] None  02/01/2023 10:15 AM Overton, Happi, CCC-SLP [facility] None  02/03/2023 11:00 AM Overton, Happi, CCC-SLP [facility] None  02/08/2023 10:15 AM Overton, Happi, CCC-SLP [facility] None  02/09/2023  1:00 PM Cottle, Steva Ready., Albert [name] [facility] None  02/10/2023 11:00 AM Overton, Happi, CCC-SLP [facility] None  02/15/2023 10:15 AM Overton, Happi, CCC-SLP [facility] None  02/17/2023 11:45 AM Overton, Happi, CCC-SLP [facility] None  02/22/2023 11:00 AM Overton, Happi, CCC-SLP [facility] None  12/29/2023  1:30 PM Sondra Come, Albert [name] [facility] None    No orders of the defined types were placed in this encounter.      -------------------------------

## 2023-01-12 ENCOUNTER — Other Ambulatory Visit: Payer: Self-pay | Admitting: Psychiatry

## 2023-01-12 ENCOUNTER — Ambulatory Visit: Payer: Medicare PPO | Admitting: Speech Pathology

## 2023-01-12 DIAGNOSIS — R41841 Cognitive communication deficit: Secondary | ICD-10-CM

## 2023-01-12 DIAGNOSIS — G3184 Mild cognitive impairment, so stated: Secondary | ICD-10-CM

## 2023-01-12 NOTE — Therapy (Signed)
OUTPATIENT SPEECH LANGUAGE PATHOLOGY  TREATMENT NOTE DISCHARGE SUMMARY   Patient Name: Albert [name] MRN: [id number] DOB:[birth year], 57 y.o., male Today's Date: 01/12/2023  PCP: Kandyce Rud, MD REFERRING PROVIDER: Cristopher Peru, MD   End of Session - 01/12/23 1149     Visit Number 3    Number of Visits 9    Date for SLP Re-Evaluation 01/26/23    Authorization Type [employer] PPO    Progress Note Due on Visit 10    SLP Start Time 1145    SLP Stop Time  1230    SLP Time Calculation (min) 45 min    Activity Tolerance Patient tolerated treatment well             Past Medical History:  Diagnosis Date   ADHD (attention deficit hyperactivity disorder)    Anemia    Anxiety    Bipolar disorder (HCC)    BPH (benign prostatic hyperplasia)    Chronic kidney disease, stage 3b (HCC)    Constipation    DDD (degenerative disc disease), cervical    Depression    Deviated septum    ED (erectile dysfunction)    GERD (gastroesophageal reflux disease)    Graves disease    History of hiatal hernia    Hypertension    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    Hypothyroidism    Pneumonia    PONV (postoperative nausea and vomiting)    Past Surgical History:  Procedure Laterality Date   BACK SURGERY     lumbar disc   COLONOSCOPY N/A 12/23/2022   Procedure: COLONOSCOPY;  Surgeon: Jaynie Collins, DO;  Location: [facility] ENDOSCOPY;  Service: Gastroenterology;  Laterality: N/A;   colonoscopyx2 N/A    2003, 2016   ESOPHAGOGASTRODUODENOSCOPY     1998, 2002, 2003, 2016   ESOPHAGOGASTRODUODENOSCOPY (EGD) WITH PROPOFOL N/A 09/12/2020   Procedure: ESOPHAGOGASTRODUODENOSCOPY (EGD) WITH PROPOFOL;  Surgeon: Earline Mayotte, MD;  Location: [facility] ENDOSCOPY;  Service: Endoscopy;  Laterality: N/A;  1ST CASE   EYE SURGERY  2006   strabismus surgery   HERNIA REPAIR     Hiatal Hernia   INSERTION OF MESH Bilateral 03/03/2021   Procedure: INSERTION OF MESH;   Surgeon: Leafy Ro, MD;  Location: [facility] ORS;  Service: General;  Laterality: Bilateral;   NASAL SEPTUM SURGERY     NM I- 131 THERAPY FOR ABLATION ([facility] HX)  04/11/2015   thyroid   SPINE SURGERY     XI ROBOTIC ASSISTED PARAESOPHAGEAL HERNIA REPAIR N/A 10/23/2020   Procedure: XI ROBOTIC ASSISTED PARAESOPHAGEAL HERNIA REPAIR WITH MESH AND INCISIONAL HERNIA REPAIR WITH MESH;  Surgeon: Leafy Ro, MD;  Location: [facility] ORS;  Service: General;  Laterality: N/A;   Patient Active Problem List   Diagnosis Date Noted   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   S/P repair of paraesophageal hernia 10/23/2020   Secondary hyperparathyroidism of renal origin (HCC) 07/03/2019   Anemia in chronic kidney disease 05/29/2019   Benign hypertensive kidney disease with chronic kidney disease 05/29/2019   Stage 3 chronic kidney disease (HCC) 05/29/2019   BPH associated with nocturia 04/02/2019   Essential hypertriglyceridemia 04/02/2019   Gastroesophageal reflux disease without esophagitis 04/02/2019   Severe depressed bipolar I disorder without psychotic features (HCC) 02/10/2018   Need for prophylactic chemotherapy 02/10/2018   GAD (generalized anxiety disorder) 02/10/2018    ONSET DATE: date of referral  12/28/2022  REFERRING DIAG: G31.84 (ICD-10-CM) - Mild cognitive impairment   THERAPY DIAG:  Cognitive communication deficit  Mild cognitive impairment  Rationale for Evaluation and Treatment Rehabilitation  SUBJECTIVE:    PERTINENT HISTORY: Pt is a 57 year old male with history of history of bipolar disorder with treatment of ECT and lithium. Currently being followed by psychiatry (Dr Jennelle Human) and Patient was evaluated at [facility] (08/01/2022) and [facility] (08/11/2022) for suicidal and homicidal ideation. Pt seen by neurology for reported slurring of speech, short-term memory problems and word finding difficulties. In reviewing pt's notes from Dr Jennelle Human, it appears pt has been  experiencing a mild cognitive impairment that waxes and wanes with his disorder.    DIAGNOSTIC FINDINGS:  08/01/2022 CT Head Wo Contrast  IMPRESSION: No acute intracranial abnormality. Pansinus mucosal thickening with frothy secretions in the right sphenoid sinus, which can be seen in the setting of acute sinusitis   PAIN:  Are you having pain? No   FALLS: Has patient fallen in last 6 months?  No  LIVING ENVIRONMENT: Lives with: lives alone Lives in: House/apartment  PLOF:  Level of assistance: Independent with ADLs, Independent with IADLs Employment: On disability   PATIENT GOALS   to get help with his memory and speech  SUBJECTIVE STATEMENT: "Can you see what my psychiatrist wrote?" Pt accompanied by: self  OBJECTIVE:   TODAY'S TREATMENT Skilled treatment session focused on pt's current cognitive communication goals. SLP facilitated session by providing the following interventions:  SLP provided skilled verbal and written information regarding compensatory memory and attention strategies. As we reviewed strategies, pt able to identify several strategies that he is using successfully and several that are more difficult for him. Continued information provided on impact that Bipolar has on cognition. All questions answered to pt's satisfaction at this time.   PATIENT EDUCATION: Education details: see above Person educated: Patient Education method: Explanation Education comprehension: verbalized understanding    GOALS:  Goals reviewed with patient? Yes  SHORT TERM GOALS: Target date: 10 sessions  With Min A, patient will use a circumlocution strategy, writing, drawing, and/or gesturing to describe target words with 80% accuracy to improve word-finding and reduce communication breakdowns, Baseline: Goal status: INITIAL: MET  2.  With Min A, patient will report <2 missed (medications/bills/appointments) over 2 week period with use of external memory aids/strategies.   Baseline:  Goal status: INITIAL: MET - he is currently residing with his parents d/t increase intrusive thoughts during the evenings/nights   LONG TERM GOALS: Target date: 01/26/2023  With Supervision A, patient will participate in complex conversation at 75% accuracy to increase ability to communicate complex thoughts, feelings, and needs.  Baseline:  Goal status: INITIAL: MET  2.  With Min A, patient will use strategies (ie., white board, daily planner/calendar, Apps on phone) to improve memory for important information with 75% accuracy.  Baseline:  Goal status: INITIAL:MET  ASSESSMENT:  CLINICAL IMPRESSION: Patient is a 57 y.o. male who was seen today for a cognitive communication treatment. All education has been completed (see above). At this time, continue to recommend close follow with pt's psychiatrist.    PLAN: No further services indicated at this time.    Navya Timmons B. Dreama Saa, M.S., CCC-SLP, Tree surgeon Certified Brain Injury Specialist [facility]  [facility] Office [phone] Ascom [phone] Fax [phone]

## 2023-01-13 LAB — CBC WITH DIFFERENTIAL/PLATELET
Basophils Absolute: 0.1 10*3/uL (ref 0.0–0.2)
Basos: 1 %
EOS (ABSOLUTE): 0.2 10*3/uL (ref 0.0–0.4)
Eos: 4 %
Hematocrit: 38.7 % (ref 37.5–51.0)
Hemoglobin: 12.6 g/dL — ABNORMAL LOW (ref 13.0–17.7)
Immature Grans (Abs): 0 10*3/uL (ref 0.0–0.1)
Immature Granulocytes: 0 %
Lymphocytes Absolute: 1.7 10*3/uL (ref 0.7–3.1)
Lymphs: 32 %
MCH: 31 pg (ref 26.6–33.0)
MCHC: 32.6 g/dL (ref 31.5–35.7)
MCV: 95 fL (ref 79–97)
Monocytes Absolute: 0.5 10*3/uL (ref 0.1–0.9)
Monocytes: 9 %
Neutrophils Absolute: 2.9 10*3/uL (ref 1.4–7.0)
Neutrophils: 54 %
Platelets: 431 10*3/uL (ref 150–450)
RBC: 4.07 x10E6/uL — ABNORMAL LOW (ref 4.14–5.80)
RDW: 12.2 % (ref 11.6–15.4)
WBC: 5.3 10*3/uL (ref 3.4–10.8)

## 2023-01-17 ENCOUNTER — Ambulatory Visit: Payer: Medicare PPO | Admitting: Speech Pathology

## 2023-01-19 ENCOUNTER — Other Ambulatory Visit: Payer: Self-pay | Admitting: Psychiatry

## 2023-01-19 ENCOUNTER — Ambulatory Visit: Payer: Medicare PPO | Admitting: Speech Pathology

## 2023-01-20 LAB — CBC WITH DIFFERENTIAL/PLATELET
Basophils Absolute: 0.1 10*3/uL (ref 0.0–0.2)
Basos: 1 %
EOS (ABSOLUTE): 0.3 10*3/uL (ref 0.0–0.4)
Eos: 6 %
Hematocrit: 38.6 % (ref 37.5–51.0)
Hemoglobin: 12.5 g/dL — ABNORMAL LOW (ref 13.0–17.7)
Immature Grans (Abs): 0 10*3/uL (ref 0.0–0.1)
Immature Granulocytes: 0 %
Lymphocytes Absolute: 1.5 10*3/uL (ref 0.7–3.1)
Lymphs: 33 %
MCH: 30.6 pg (ref 26.6–33.0)
MCHC: 32.4 g/dL (ref 31.5–35.7)
MCV: 94 fL (ref 79–97)
Monocytes Absolute: 0.3 10*3/uL (ref 0.1–0.9)
Monocytes: 7 %
Neutrophils Absolute: 2.3 10*3/uL (ref 1.4–7.0)
Neutrophils: 53 %
Platelets: 484 10*3/uL — ABNORMAL HIGH (ref 150–450)
RBC: 4.09 x10E6/uL — ABNORMAL LOW (ref 4.14–5.80)
RDW: 12.2 % (ref 11.6–15.4)
WBC: 4.5 10*3/uL (ref 3.4–10.8)

## 2023-01-24 ENCOUNTER — Other Ambulatory Visit: Payer: Self-pay | Admitting: Psychiatry

## 2023-01-24 DIAGNOSIS — Z79899 Other long term (current) drug therapy: Secondary | ICD-10-CM

## 2023-01-24 DIAGNOSIS — F314 Bipolar disorder, current episode depressed, severe, without psychotic features: Secondary | ICD-10-CM

## 2023-01-25 ENCOUNTER — Ambulatory Visit: Payer: Medicare PPO | Admitting: Speech Pathology

## 2023-01-26 ENCOUNTER — Other Ambulatory Visit: Payer: Self-pay | Admitting: Psychiatry

## 2023-01-27 ENCOUNTER — Encounter: Payer: Medicare PPO | Admitting: Speech Pathology

## 2023-01-27 LAB — CBC WITH DIFFERENTIAL/PLATELET
Basophils Absolute: 0.1 10*3/uL (ref 0.0–0.2)
Basos: 2 %
EOS (ABSOLUTE): 0.2 10*3/uL (ref 0.0–0.4)
Eos: 4 %
Hematocrit: 39.2 % (ref 37.5–51.0)
Hemoglobin: 12.8 g/dL — ABNORMAL LOW (ref 13.0–17.7)
Immature Grans (Abs): 0 10*3/uL (ref 0.0–0.1)
Immature Granulocytes: 0 %
Lymphocytes Absolute: 1.6 10*3/uL (ref 0.7–3.1)
Lymphs: 36 %
MCH: 31.2 pg (ref 26.6–33.0)
MCHC: 32.7 g/dL (ref 31.5–35.7)
MCV: 96 fL (ref 79–97)
Monocytes Absolute: 0.3 10*3/uL (ref 0.1–0.9)
Monocytes: 7 %
Neutrophils Absolute: 2.3 10*3/uL (ref 1.4–7.0)
Neutrophils: 51 %
Platelets: 478 10*3/uL — ABNORMAL HIGH (ref 150–450)
RBC: 4.1 x10E6/uL — ABNORMAL LOW (ref 4.14–5.80)
RDW: 12.1 % (ref 11.6–15.4)
WBC: 4.5 10*3/uL (ref 3.4–10.8)

## 2023-01-31 ENCOUNTER — Telehealth: Payer: Self-pay | Admitting: Psychiatry

## 2023-01-31 MED ORDER — RISPERIDONE 3 MG PO TABS: 3.0000 mg | ORAL_TABLET | Freq: Every day | ORAL | 0 refills | Status: DC

## 2023-01-31 NOTE — Telephone Encounter (Signed)
So is he taking the risperidone 2 mg HS?  If so and it is not working then we can send in a Rx for risperidone 3 mg tab, 1 HS, no RF

## 2023-01-31 NOTE — Telephone Encounter (Signed)
Next visit is 01/11/23. Albert [name] called and informed that he has been having homicide and suicidal thoughts. He would like someone to call him at [phone]. He told me he is not going to act on it.

## 2023-01-31 NOTE — Telephone Encounter (Signed)
Rx sent to the requested pharmacy and patient notified.

## 2023-01-31 NOTE — Telephone Encounter (Signed)
From 9/10 visit:   Start risperidone 2 mg tablet, 1 at night for racing thoughts and intrusive homicidal and suicidal thoughts. Call in 3 weeks if not better and we will increase the dose.  When I called patient back he didn't mention SI/HI. He does say you told him the above and he wants to increase the dose. Asks Rx be sent to [facility] today.

## 2023-02-01 ENCOUNTER — Encounter: Payer: Medicare PPO | Admitting: Speech Pathology

## 2023-02-02 ENCOUNTER — Other Ambulatory Visit: Payer: Self-pay | Admitting: Psychiatry

## 2023-02-03 ENCOUNTER — Encounter: Payer: Medicare PPO | Admitting: Speech Pathology

## 2023-02-03 LAB — CBC WITH DIFFERENTIAL/PLATELET
Basophils Absolute: 0.1 10*3/uL (ref 0.0–0.2)
Basos: 1 %
EOS (ABSOLUTE): 0.3 10*3/uL (ref 0.0–0.4)
Eos: 6 %
Hematocrit: 39.1 % (ref 37.5–51.0)
Hemoglobin: 12.5 g/dL — ABNORMAL LOW (ref 13.0–17.7)
Immature Grans (Abs): 0 10*3/uL (ref 0.0–0.1)
Immature Granulocytes: 0 %
Lymphocytes Absolute: 2 10*3/uL (ref 0.7–3.1)
Lymphs: 41 %
MCH: 31 pg (ref 26.6–33.0)
MCHC: 32 g/dL (ref 31.5–35.7)
MCV: 97 fL (ref 79–97)
Monocytes Absolute: 0.3 10*3/uL (ref 0.1–0.9)
Monocytes: 7 %
Neutrophils Absolute: 2.2 10*3/uL (ref 1.4–7.0)
Neutrophils: 45 %
Platelets: 417 10*3/uL (ref 150–450)
RBC: 4.03 x10E6/uL — ABNORMAL LOW (ref 4.14–5.80)
RDW: 11.9 % (ref 11.6–15.4)
WBC: 4.9 10*3/uL (ref 3.4–10.8)

## 2023-02-07 ENCOUNTER — Telehealth: Payer: Self-pay | Admitting: Psychiatry

## 2023-02-07 NOTE — Telephone Encounter (Signed)
Patient lvm at 11:35 stating that he is still having suicidal thoughts waking up in the middle of night with these thoughts. During the day he is experiencing intrusive thoughts. He has an appt 10/9 but he would like nurse to call him before than to discuss what can be done. PH: [phone]

## 2023-02-07 NOTE — Telephone Encounter (Addendum)
TC to pt: He sees me in 2 days.  He is not suicidal historically but he has intrusive suicidal thoughts that disturb him.  He has no will or intention to act on these thoughts. He has risperidone 3 mg.  Have him increase to 1 and 1/2 of the tablets.  He can start the extra now and tomorrow take 1/2 of 3mg  tab in AM and 1 at night. He agrees.  Meredith Staggers, MD, DFAPA

## 2023-02-08 ENCOUNTER — Encounter: Payer: Medicare PPO | Admitting: Speech Pathology

## 2023-02-09 ENCOUNTER — Telehealth: Payer: Self-pay | Admitting: Psychiatry

## 2023-02-09 ENCOUNTER — Ambulatory Visit (INDEPENDENT_AMBULATORY_CARE_PROVIDER_SITE_OTHER): Payer: Medicare PPO | Admitting: Psychiatry

## 2023-02-09 ENCOUNTER — Encounter: Payer: Self-pay | Admitting: Psychiatry

## 2023-02-09 DIAGNOSIS — F5105 Insomnia due to other mental disorder: Secondary | ICD-10-CM

## 2023-02-09 DIAGNOSIS — F314 Bipolar disorder, current episode depressed, severe, without psychotic features: Secondary | ICD-10-CM

## 2023-02-09 DIAGNOSIS — F4001 Agoraphobia with panic disorder: Secondary | ICD-10-CM

## 2023-02-09 DIAGNOSIS — Z79899 Other long term (current) drug therapy: Secondary | ICD-10-CM

## 2023-02-09 DIAGNOSIS — F411 Generalized anxiety disorder: Secondary | ICD-10-CM | POA: Diagnosis not present

## 2023-02-09 DIAGNOSIS — K117 Disturbances of salivary secretion: Secondary | ICD-10-CM

## 2023-02-09 DIAGNOSIS — E538 Deficiency of other specified B group vitamins: Secondary | ICD-10-CM

## 2023-02-09 DIAGNOSIS — K5903 Drug induced constipation: Secondary | ICD-10-CM

## 2023-02-09 DIAGNOSIS — G3184 Mild cognitive impairment, so stated: Secondary | ICD-10-CM

## 2023-02-09 MED ORDER — RISPERIDONE 4 MG PO TABS: 8.0000 mg | ORAL_TABLET | Freq: Two times a day (BID) | ORAL | 0 refills | Status: DC

## 2023-02-09 MED ORDER — CLOZAPINE 100 MG PO TABS
600.0000 mg | ORAL_TABLET | Freq: Every day | ORAL | 8 refills | Status: DC
Start: 2023-02-09 — End: 2023-04-12

## 2023-02-09 MED ORDER — ATROPINE SULFATE 1 % OP SOLN
2.0000 [drp] | Freq: Three times a day (TID) | OPHTHALMIC | 1 refills | Status: DC | PRN
Start: 2023-02-09 — End: 2023-06-22

## 2023-02-09 NOTE — Progress Notes (Signed)
DEMETREUS SARVIS [id number] 06-Jan-1966 57 y.o.    Subjective:   Patient ID:  SWEN KERNS is a 57 y.o. (DOB Aug 10, 1965) male.  Chief Complaint:  Chief Complaint  Patient presents with   Follow-up    Intrusive thoughts   Depression   Anxiety   Medication Reaction    Depression        Associated symptoms include decreased concentration, fatigue, appetite change and suicidal ideas.  Past medical history includes anxiety.   Anxiety Symptoms include decreased concentration, dizziness, nervous/anxious behavior and suicidal ideas. Patient reports no chest pain or palpitations.      [name] presents to the office today for follow-up of mixed bipolar, anxiety and still grieving stress of breakup.  He requires frequent follow-up because of long-term symptoms.  At visit October 25, 2018.  We made several medicine changes because of his concerns about sleep and other issues.  We increased olanzapine back to 7.5 mg bc not sleeping as well at 5 mg.  He has to have the prescription for quetiapine written for up to 3 tablets at night in case insomnia is worse because insomnia makes his mood disorder so much worse.  We discussed that was above the usual max but the request was granted given his treatment resistant status. We also reduce lithium from 900 mg daily to 750 mg daily to try to reduce tremor and muscle twitches.  At  visit March 28, 2019.  Fluoxetine was increased to 40 mg daily.   Early January 2021 increased to 60 mg daily.  No SE.  seen June 28, 2019.  No further meds were changed except olanzapine was increased back to 10 mg daily to see if if he could get additional benefit per his request.  Covid vaccinated.  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  Then had cholecystectomy.  April 2021 appt with the following noted: 2 episodes night sweats lately.  Memory has been very bad lately.  Repeatedly asks mother questions. Still  tend to stay in his room and his bed.  Still has rapid cycling  mood swings.  Maybe some better with increase in olanzapine to 10 and tolerating itl.  Would like to get out more but can't DT Covid.  Can perform necessary chores.  Will get out of the house when he can.  No change in death thoughts and anxiety in intensity but is better with frequency.  Usually comes and goes in waves but more persistent.  Consistent with meds.  Ativan not helping anxiety very dramatically but he's not sure.  Fidgety.  No trigger other than still grieving relationship and can't get it out of his head.  Poor energy, concentration and more forgetful. In bed more and less active.  Sleep ok lately which is unusual.  Can concentrate on financial matters and stock market at times.  Tolerating meds.  Still some intrusive SI without reason. Less frequent obsessive thoughts about broken relationship and has been doing this for months.  Can't let it go.  Loop.   No unusual stress even with the family who is supportive. Likes the benefit that Zyprexa gives. No sleepwalking nor falling nor odd behavior.  No history of sleep walking.  He understands this may recur with an increase.  Also wants option to rarely take extra quetiapine 300 for sleep prn. Plan:  Try to reduce lorazepam if possible.  10/22/2019 appointment with the following noted: Continues fluoxetine 60, lithium 600 mg, lorazepam 2 mg AM and  HS and 1 mg midday, olanzapine 10, quetiapine 600 mg HS. Still anxious chronically and including driving in crowded spaces.  Doesn't thing Ativan helps as well as Xanax but less cognitive problems. Sleep is not as good.  Occ EFA.  More EMA and wanting to do things in the middle of the night but this is not typical.  Wonders about why that happens.  Some napping.   Tolerating meds.  Asks about weight loss meds.  Disc this in detail.   Plan no changes except OK meclizine prn vertigo.  02/11/20 appt with the following noted: Able to gradually reduce lorazepam to 1 mg AM and HS. More depressed over  time and less interested in things and less interest in going out but does with his parents. Has reduced from 800 to 600 mg HS with some awakening but usually able to go back to sleep.[name]  Spending a good amount of time in bed bc watches TV in bedroom.  Parents watch TV in different part of the house.  No mood swings he notices.   Concerns about weight gain about 200#. Likes olanzapine's benefit for sleep. Plan: Trial for TRD to  Increase fluoxetine to 80 mg daily  to use the combo with olanzapine for TR bipolar depression.   04/21/20 appt with following noted: I thought in beginning some benefit with fluoxetine.  Lifelong negative thinking continues.   Not much bipolar.  Reads a lot on bipolar.   Still tired and anxious a little more may be seasonal.  No familial stressors.  Tends to lay down in afternoon.  Anxiety not over anything in particular.   No SE with fluoxetine. Took meclizine prn.  Easily motion sick.    Asks questions about newer drugs for bipolar like Caplyta. Plan:  No med changes  06/30/20 appt noted: Tired of wearing masks.  Vaccinated.  Asked questions about when this will end with mask mandate.  Mood up and down some since here and getting up 2-3 times.  Disc awakening problems.  Trying to do better bc night eating some.   Mood is about average today so far.  [name] out with friends for breakfast.  Recognizes activity helps mood.   3 days in a row napped a lot in the afternoon.   Bed is a comfort place.  Gets bored and hard to motivate.  Tries to stay away from night eating and spending.   More depressed when alone and wonders about med changes. Plan: Failed response to olanzapine + fluoxetine for TR bipolar depression.   Reduce fluoxetine to 1 daily and reduce olanzapine to one half nightly for 10 days and then stop it. Then start Caplyta 1 daily  08/27/2020 appointment with the following noted: Patient decompensated with the above switch to Caplyta with intrusive suicidal  thoughts and had to be hospitalized for psychiatric reasons.  Multiple phone calls with family members since that time.  Spoke with the psychiatric nurse practitioner with the decision to restart olanzapine in place of the Caplyta given the patient was more stable while on the combination of Seroquel and olanzapine than with the Caplyta. Caplyta triggered HI/SI and depressed and confused on it, and agitated and still has some of it now. No akathisia. Frustrated with lack of therapy at the hospital.   Hydroxyzine didn't help jitteriness.  Feels some better.  Fleeting SI & HI but not obsessive like it was prior to hospitalization.  Feels a little amped up and nervous.  Scared of what  could happen.  Apprehensive being here talking about things. 5-6 hours sleep last night and would like to have more. At mom and dad's house right now and up more in the day. Inadvertently stopped lorazepam abruptly by mistake likely contributing to shakiness. Still some memory issues.  Trying to stay out of bed when watching TV.  Some awakening but managing. Occ fleeting SI and distracts himself.   Always spends a lot of time just laying around.   Can have anxiety for no reason like coming here, and varies in intensity without pattern.      Less panic than in the past.  Some chronic depression and hyperactivity and loudness and hyperverbal.  Usually back to sleep.  Total 6 hours but some napping, awakens 2-4 times nightly but back to sleep..  Taking quetiapine 600 mg HS.  still lacks interest and motivation.  Can follow a TV show if interested. Plan: Restart lorazepam 1 mg in the morning, 1 mg in the afternoon and 1 mg at night for anxiety Stop hydroxyzine  Increase olanzapine to 1 and 1/2 of 10 mg tablets in evening  09/05/2020 appt noted: seen with parents today at his request Made med changes noted and tolerated the changes Better than I did last week. Now only fleeting HI/SI and "no where near what it was".  Sleeping  better.  Still not a lot of energy.  Anhedonia.  Less agitation.  A lot of anxiety all the time but it's helping.  Would like more energy and motivation.   Doesn't handle stress well. Parents note he's less shakey.  He's still staying with his parents.  Only driven once since this happened and F wants him to be able to drive and function more independently.  M agrees he's better.  Memory is better but not normal.  Primarily STM problems No akathisia with olanzapine this time.    Administering his own meds but mo watched. Slept 8 hours last night. Plan consider further reductions in quetiapine  10/17/20 appt noted: Stayed on Seroquel 600 HS and olanzapine 15. Olanzapine seemed to do most for the sleep.  Also helping eliminated HI/SI for the most part.  Wants to try to increase it.  Depression is much better with less rumination also.  Fears akathisia at higher dose as in the past. Also on fluoxetine 40.   Plan: Reduce Seroquel to 1 and 1/2 tablets at night Increase olanzapine to 1 of the 15 mg tablet and 1/2 of the 5 mg tablet for 1 week,  Then, if tolerated increase the olanzapine to 20 mg or 1 of the 15 mg tablets and 1 of the 5 mg tablets. If possible then also reduce quetiapine to 1 tablet at night.  11/05/2020 appointment with the following noted: Up to olanzapine 20 mg hs for 3 days.  Reduced Serooquel to 450 mg HS.  Didn't try to go lower. Had hiatal hernia and umbilical hernia surgery recently with no problem.   No akathisia so far. Sleep ok with meds so far changes.  No mood changes yet but overall likes the smoothness of olanzapine and helping sleep without knociing him out. Still some occ HI/SI, he thinks bc of anxiety generally.  Easily anxious. Plan: Continue olanzapine 20 mg nightly longer to give it time to help mood sx reduce quetiapine to 1 tablet at night to minimize polypharmacy and reduce risk of akathisia.  01/07/21 appt noted: Able to reduce quetiapine 300 mg HS ok with  decent sleep but  still wakes a lot.  No AM hangover.  Don't have a lot to do.  Can enjoy going out and doing things.  But doesn't do it longterm.  Family is doing good.  Father started year 3 at his job.   Tolerating the meds well.  May wake more with quetiapine reduction. Lost 10#. Wonders if could try other meds he didn't tolerate before bc now can tolerate olanzapine and couldn't tolerate it in the past DT akathisia. Mood never great but can swing into depresssion without reason.   Plan: Continue olanzapine 20 mg nightly longer to give it time to help mood sx reduce quetiapine to 1/2 of the 300 mg  tablet at night to minimize polypharmacy and reduce risk of akathisia.  02/02/2021 appointment with the following noted: Tried reduced quetiapine to 150 mg and after a week had withdrawal sx including jittery and SI and he increased to 250 mg daily.  Felt better after increasing to 300 mg daily and last week reduced to 250 mg daily.  After increase quetiapine felt better in a few days.  Seems more alert with less quetiapine but can't tolerate a quick withdrawal.   Some awakening.  Plan: Reduce quetiapine by 50 mg every 2-4 weeks.  03/09/2021 phone call: Pt called and said that he is weaning off the seroquel. He was down to 200 mg of the seoquel and he started having withdrawal symptoms and sucidal thoughts. He went back up to 250 mg because he knew he was good on that.    Pt stated he starting back taking 250 mg on Saturday due to the symptoms he was having.Now he is no longer having suicidal thoughts but still very jittery. MD: He may just need to taper slower than most people.  If he can tolerate the 250 mg Seroquel with the jitteriness it should get better over the next 1 to 2 weeks.  Then he can try going down again by 50 mg at a time.  He probably needs to go down by just 50 mg every 2 to 4 weeks and wait till he fully adjusts to each reduction before he reduces again.  03/17/21 appt noted: Hernia  surgery 2 weeks ago. Seroquel 250 mg for a month then 200 mg daily and had SI and increased again to 300 mg daily.  Would love to get off it.  Withdrawal triggers intrusive HI/SI but otherwise doesn't have them No SE. Sleep is OK but not enough REM sleep.  Satisfied with other meds.   No current HI/SI.  No paranoia.  Still has anxiety and taking Ativan every 6 hours.  It works but doesn't cure it. Chronic anxiety and ask about ketamine for chronic depression. Plan: Because he had WD reducing from 250 to 200 mg daily will have to go slowly. Reduce quetiapine by 25 mg every 2-4 weeks. Reduce Seroquel to 250 mg for 2 weeks, Then reduce to 225 mg for 2 weeks, Then reduce to 200 mg for 2 weeks, Then reduce to 175 mg for 2 weeks, Then reduce to 150 mg Also: Buspirone 30 mg tablets for anxiety Start 1/3 tablet twice daily for 1 week Then increase to two thirds twice daily for 1 week Then increase to 1 tablet twice daily  04/15/2021 appointment with the following noted: Reduced Seroquel to 200 mg HS and increased buspirone to 30 mg BID STM px and wants to try to reduce Ativan to see if it is better.  But fears with drawal sx.  Parents complain about his memory. Less HI and SI since here. Less depressed. Plan: Buspirone 30 mg tablets for anxiety Start 1/3 tablet twice daily for 1 week Then increase to two thirds twice daily for 1 week Then increase to 1 tablet twice daily Continue olanzapine 20 mg nightly longer to give it time to help mood sx Because he had WD reducing from 250 to 200 mg daily will have to go slowly. Reduce quetiapine by 25 mg every 2-4 weeks. Reduce Seroquel to 250 mg for 2 weeks, Then reduce to 225 mg for 2 weeks, Then reduce to 200 mg for 2 weeks, Then reduce to 175 mg for 2 weeks, Then reduce to 150 mg Trial taper lorazepam: In hopes of improving short-term memory  04/30/2022 phone call: Complaining of withdrawal symptoms reducing lorazepam from 0.5 mg 5 times a  day to 0.5 mg 4 times daily.  05/21/2021 appointment with the following noted: Gotten onto buspar 30 BID and down on lorazepam to 0.5 mg QID. Gotten down to Seroquel 200 mg HS A little restless every now and then but able to reduce lorazepam for the last weeks. Is too inactive and lays in bed too much watching TV.   Can' t tell effect of buspirone bc of other changes.  Sleep with awakening with 6-7 hours. Fleeting SI without trigger or plan or intent. Depressed more than anxious. Enjoys watching stocks and investing. Plan: Trial taper lorazepam gradually over time In hopes of improving short-term memory But for now continue lorazepam 0.5 mg QID Start clonidine 0.1 mg tablets one half at night for 3 nights, then 1/2 tablet twice daily for 3 nights, then one half in the morning and 1 tablet at night If clonidine does not noticeably help anxiety call the office  05/27/2021 phone call initially complaining of homicidal and suicidal thoughts after taking 1 mg of clonidine.  He was told to stop the medication.  But then called back stating he was having homicidal and suicidal thoughts from withdrawal from the medication which was not logical.  He agreed it was not logical and stated on 05/29/2021 that he felt better  06/23/2021 appointment the following noted: No clonidine. Sunday panic attack eating out and then having SI.  Stayed with father to feel safer. Intrusive thoughts of hurting parents or others.  HI only when has SI.  Usually panic without SI.  Then had intrusive thoughts about hurting others.   Usually intrusive thoughts are brief and fleeting.    07/20/21  appt noted; More anxious with less lithium.  More anxious in his body.  Not more depressed or irritable.  Sleep is the same.  More racing thoughts.  Chronic SI probably a litte worse but not unmanageable. Rduced lithium last week to 300 mg daily. No recent change in lisinopril. Off the clonidine for a week or so. Doesn't drink much  water.  Drinks a lot of diet coke. No marked akathisia or RLS but does shake his leg DT anxiety. Worries about needing mor emedicine. Plan: Increase olanzapine to 25 mg nightly bc more racing thoughts and anxiety with less lithium DT severity of sx and difficulty getting off Seroquel conside rincrease.  07/30/21 TC : CO racing SI today and wonders about whether increased sx racing thoughts, hyperactivity, SI related to Reduced lithium from 600 mg daily to 300 mg daily recently DT lithium level 1.8.  could be related.    Plan: increase olanzapine 30 mg pm Today take lithium 600 mg now.  Tomorrow start lithium CR 450 mg daily Continue olanzapine 30 mg PM until SI resolves unless akathisia returns.  Meredith Staggers, MD, DFAPA  08/17/21 appt noted:  No akathisia with olanzapine 30 mg pm (about 3 weeks) and is sleeping better. Increased lithium to 450 mg daily. Tolerating meds. Still racing negative thoughts maybe some better.  Asked about it. SI come and go.   Anxiety about the same as depression.  No motivation unless has to do something. Plan: Reduce  fluoxetine 20 mg 1 daily in hopes racing thoughts and anxiety are better.  09/14/2021 appointment with the following noted: No difference noted with reduction fluoxetine 20 mg daily. Most of the time negative thoughts, ex "what if I get in a wreck?"  Frequent negative thoughts.   Still on lorazepam 0.5 mg TID.   Wonders about trying prior meds that caused akathisia bc no longer gets it with olanzapine 30 mg daily. Sleep is usually ok.  Taking Seroquel 200 mg HS.  Thinks the olanzapine helps his sleep and doesn't want to stop it. Panic 1-2 times per month.  Then takes extra lorazepam. Plan: DC fluoxetine 20 mg 1 daily  Trial Auvelity off label for depression 1 in the AM for 1 week then 1 in AM and 1 with evening meal  10/15/2021 appointment with the following noted: Occ of getting confused as to day and went to church on a Thursday my mistake.    Had it happen a few months ago too.  Memory is terrible. Thinks he couldn't tolerate more Auvelity. Not a lot of benefit from [place] 1 daily. Still quite a bit of anxiety.   A little drowsy  and sleeps 7-8 hours at night. Word finding problems.  Lost wallet. Plan: DC  Auvelity  For [facility]: memantine 1/2 tablet in the AM for 1 week, then 1/2 tablet twice daily, then 1/2 tablet in the AM and 1 tablet at night for 1 week then 1 tablet twice daily  10/22/2021 phone call complaining of additional stress asking to start new medication because of anxiety and more suicidal thoughts without intent or plan.  Reports taking more than 4 prescribed Ativan a day recently.  Asking to increase olanzapine which is already at 30 mg daily. MD response:We cannot go any higher in the dosage of olanzapine that he is currently taking.  A lot of these suicidal thoughts are apparently anxiety driven.  Let us have him resume taking paroxetine which is one of the more effective medicines for anxiety.  I will send it in and the instructions will be paroxetine 20 mg tablets 1/2 tablet daily for 1 week then 1 tablet daily  11/16/21 appt noted: Multiple phone calls since she was here.  Complaining of suicidal thoughts from memantine which was stopped. Started paroxetine and up to 20 mg daily.  Continues olanzapine 30 mg, lithium 450 mg nightly, lorazepam 0.5 mg 4 times daily , And quetiapine 200 mg nightly. Questions about whether memantine caused the neg SI really bc has them at times. When has SI often anxiety driven.  Will tend to ruminate on past relationship ended.   Edgy and irritable all the time but memantine seemed to make it worse. Mind is getting better with less SI lately.   No SE with paroxetine.   Aggrivated by forgetfulness.  eXample, talked with someone about going to breakfast last night and then forgot to go this mornin.g. Plan prescribed donepezil 5 mg daily for cognitive complaints specifically  forgetfulness  12/31/2021 appointment with the following noted: He had been prescribed donepezil 5 mg for cognitive complaints.  He called back reporting he felt it was causing homicidal thoughts and was instructed to stop it.  He has had these types of thoughts in the past.  He had no desire to act on them. Discouraged. Ongoing depression and reduced enjoyment and negative thoughts.  Can enjoy some things. A lot of anxiety chronically.  Trying to limit Ativan to 5 of 0.5 mg daily.  Sometimes needs 2. Constipation for years.  02/03/22 appt noted: Depression makes LBP worse. Ongoing chronic depression. Tolerated rivastigmine without triggering SI Chronic anxiety and Ativan helps some. Occ panic but not usual.  Asks if any other meds coud be used. Plan: But for now continue lorazepam 0.5 mg QID, but use LED for cog reasons Increase rivastigmine to 3 mg capsule 1 twice daily or 2 of the 1.5 mg capsules twice daily.  Call if you have problems with nausea Start dextromethorphan 1 capsule twice daily . Thi sis off label for depression based on MOA of Auvelity and use of paroxetine to prolong half life of DM. Presence of paroxetine may have been reason he didn't tolerate Auvelity brief trial before.  Will proceed slowly.  03/17/2022 appointment noted: Current psych meds: Rivastigmine 3 mg twice daily, quetiapine 200 mg nightly, paroxetine 40 mg daily, olanzapine 30 mg nightly, Namenda 10 mg daily, lorazepam 0.5 mg 4 times daily, lithium CR 450 mg nightly, dextromethorphan 15 mg twice daily A lot of anxiety since here.  Some days taken more lorazepam to 6-7 tablets daily.  No major triggers but anxious even about coming here and other normal things.  Cancelled trip to collect rocks with friend and cancelled the trip bc anxious.  Usually if can go he is ok.  Lots of SI ongoing Memory might be a little better. Asks how he responded to [employer].  Will check paper chart.  03/24/22 TC:  CO SI worse and  restilless with Rivastigmine 9 mg . MD response:  Stop the rivastigmine since the [place] appears to be having side effects from it.  He was having anxiety at the last appointment 2 and then we increased the dose.  This might be causing the anxiety to be worse. He has chronic suicidal thoughts without intent or plan.  However it appears the thoughts are more intense with the rivastigmine     05/06/21 appt noted: Stopped rivastigmine DT anxiety and worsening SI.  Was better the next day. This week had some SI.  Thinking about the prior relationship tends to lead to SI. Will take Ativan when gets SI and it helps a little at 0.5 mg at a time. Plan: Consider fluvoxamine.  Yes this may help with obsessions on old GF and he failed other optioons. Start fluvoxamine 1/2 of 50 mg tablet and reduce paroxetine to 1 and 1/2 of 20 mg tablets for 5 days, Then increase fluvoxamine to 1 tablet daily and reduce paroxetine to 1 tablet daily for 5 days, Then increase fluvoxamine to 1-1/2 tablets daily and reduce paroxetine to 1/2 tablet daily for 5 days, Then increase fluvoxamine to 2 tablets daily and stop paroxetine   06/07/22 appt noted: Feels like the fluvoxamine 50 BID is too much.  On this a couple of weeks. Worries he might be worse with this with regard to SI.    He thinks it's worse with increase.   New neighbor who has a young boy. He worries that  Reginal Lutes is triggering depression and intrusive SI/HI a couple of days later and lasts a little while. Has had anxiety turning into panic attacks.  This gets associated with depression and SI/HI.  These thoughts are intrusive and he doesn't want to act on them.  Can have HI even towards family with whom he has no negative emotions.  Little things like a bump up in parking lot sets off his depression and anxiety and doesn't seem to handle things.   No sig akathisia.  In the past it's been continuous.   Awakens every 2-3 hours.  Usually goes back to sleep with  meds. Has not had alcohol in 20 years and had some intrusive thoughts without drinking.   Plan: As soon as he feels comfortable increase fluvoxamine to 50 mg AM and 100 mg PM for intrusive thoughts of HI/SI and obs on GF  07/06/22 appt noted: Increased fluvoxamine to 150 mg daily and felt more agitated and some SI so reduced to 100 mg 4 days ago and feels better so far.  Likes the fact it is good for anxiety and obs thoughts.  Wonders if there is a similar med.  Frustrated he can't get relief.  Some improvement in obs on exGF but not resolved. Continue other meds: lithium CR 450 mg HS, lorazepam 0.5 mg QID, Memantine 10 BID, olanzapine 30 mg HS, quetiapine 200 mg HS Tingling and burning in legs at night but doesn't interfere with sleep. Got insurance to pay for [employer] and lost 13#.   No full panic lately. But some anxiety waves.   Still major problems with memory. Plan: Wean fluvoxamine 50 mg daily for a month then 1/2 daily for 2 weeks then stop it DT  NR.  Watch for more anxiety.  07/28/22 TC: Franchot Erichsen, CMA  to Me     07/28/22  4:16 PM Note Mom called reporting patient has been confused, dizzy, and sluggish for the last 3 days. He is now staying with her. She had several questions in regards to this:   Lithium level - she feels he needs one done, last result in Epic was 08/2021 that I see. She would like order sent to [facility] for tomorrow.   Patient is on Wegovy, has been for about a month, and she wonders if this could cause sx.   Patient has meclizine and she is questioning if she should give that for his dizziness considering other sx.   She questions if perhaps he got up during the night and took additional medications, though doesn't know if this happened and if so what he took.    On 3/5 visit he was to wean fluvoxamine to 50 mg qd for a month and then 1/2 tab for 2 weeks and then stop. Mom and pt unsure if he started the wean.    He has F/U 4/4.        Me  to Franchot Erichsen, CMA   CC   07/28/22  5:44 PM Note Yes get lithium level in the AM and don't take any lithium tonight or in the morning before the blood test.     Reginal Lutes should not cause these sx except if he is losing wt quickly or is dehydrated it can cause lithium toxicity and include these sx.  He needs to drink lots of water.   He should go ahead and wean the fluvoxamine as instructed in the last appt.   If the sx get worse, go  to the ER.    Me   CC   07/29/22  6:49 PM Note RTC   Sx not as severe as when he had to go to hospital in the past.  [name] got Lithium level today.   Speech is not good .  Confused at times, but better today.  A little tremor .  A little balance problems.  No falls.  No diarrhea Took lihtium 450 last night. Mo says he got up in the middle of the night and moved things around last night and he doesn't rmember.   No change in night time meds.   Gradually reducing fluvoxamine.   No extra lorazepam.     Spoke with mother who verifies the same.   No med changes but push fluids.   Meredith Staggers, MD, DFAPA      08/05/22 appt : with parents Pt hosp 08/02/22 with acute kidney injury and somewhat elevated lithium level with some confusion.  Spoke with psych NP at hosp and lithium was decreased to 450 mg HS. No pending appt known with medical doctors about kidney function. Don't feel good and having HI and SI and feels he needs lithium for these issues.  Doesn't feel he should have been discharged.   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 No added meds at night but has been confused at night and getting up multiple times and doing things he doesn't remember.  This concerns parents.  Parents been up at night with him and he doesn't make sense.  Still groggy in the am per mo for an hour.   Plan: Plan: Get lithium level and kidney test ASAP Stop fluvoxamine Reduce lithium 300 mg capsule 1 at night Monday night: reduce to olanzapine to 20 mg at night (stop the 10 mg tablet) and  start clozapine prescription. Monday 4/15 : increase clozapine to 100 mg at night and reduce quetiapine to 1/2 of 200 mg tablet and continue olanzapine 20 mg each evening.  08/01/22 & 08/11/22 ED for HI and SI  08/12/22 TC mother:  Mother, Patsy called. Per note made by Larinda Buttery. He was referred to [facility].Reporting that he did go up there last night. This morning they called her and told her to come pick him up. They told her he wasn't having homicidal or suicidal thoughts and it would be four to five days of him waiting to go to a facility in [place]. They recommended him for PHP to start 4/16. She picked him up today. He is worried about these thoughts coming back up. Also, [facility] came out yesterday to assess him and offered counseling from [facility]. He wants to do counseling with them.  Any input on care he can receive?  He is so discouraged and wants to be safe.  (She notes that the [facility] gave him- Zyprexa 10mg  at 1:07am and again at 1:25am per the med list- based on his previous med list) He was supposed to be off of Zyprexa10mg . Has been on  Clozapine since Monday. He did go ahead and take it before he left ( 2pills) so he hasn't missed a dose and did go get labwork done. Is wanting to continue the Clozapine, please check the labwork to be able to fill the 100mg  dose.    MD resp:  Continue all meds as RX except go ahead and increase clozapine to 100 mg tablets, 1 each night as soon as he can get the 100 mg tablets from pharmacy.  RX sent.  Trying  to speed up his chance of response by getting it to a hgiher dose.  Clozapine is very effective for HI/SI.  If he gets worsening HI/SI in the interim it will not be caused by meds but just the natural waxing and waining of his sx.  Once clozapine dose gets high enough it should help but avg patient needs 300 mg so we have to just be patient.      08/20/22 appt:  Up to 100 mg clozapine HS SE constipation and using Colace, Miralax.  Been to [facility] twice  since here.    Ongoing continuous HI/SI.   All I want to do is go to sleep to escape.  Dep causing the thoughts.   Taking Ativan 0.5 mg BID-QID.  He thinks mother not giving him enough of it.  He thinks anxiety triggers HI/SI.  He fears HI and hopes he's strong enough to reisist.  Not angry or wanting to hurt anyone in reality but has the intrusive thoughts. Conc and memory still not good.   Sleep not much different.   Staying with parents currently. Dep gotten worse M worrying he's dependent on lorazepam.   Confusion at night is no longer happening.  Not wandering at night.  Plan: For severe constipation start Linzess 145 mg AM Get lithium level and kidney test ASAP Stopped fluvoxamine and confusion at night is gone  lithium 300 mg capsule 1 at night  olanzapine to 20 mg at night  increase clozapine to 150 mg at night for 3 nights then 200 mg HS   olanzapine 20 mg each evening. Continue lorazepam 0.5 mg QID, needs this and informed mother  09/07/22 appt noted: Psych meds: clozapine 250 MG HS, lithium 300 mg HS, lorazepam 0.5 mg BID-QID, olanzapine 20 .  Off Seroquel, linzess 290 mg daily, Miralax, Colace. Sleep 7-8 hours without change from before clozapine.   Mood is still very depressed and still gets anxiety. Still problems with constipation.   Dep is so much it is causing intrusive HI/SI.   No more bizarre behaviors at night.   Plan: Plan: Get lithium level and kidney test ASAP  lithium 300 mg capsule 1 at night  olanzapine to 20 mg at night  increase clozapine to  300 mg HS as soon as tolerated and then 400 mg HS.  olanzapine 20 mg each evening. Continue lorazepam 0.5 mg QID, needs this and informed mother  10/07/22 appt noted: alone and with parents Rough month.  Altered voice for a couple of weeks or so..  Mind not good.  Feel out of sorts.  Jittery.  Constipation.  No appetite.  On Wegovy.  Saw GI doc.   May need to reduce Wegovy bc poor appetite and constipation.  Lost 25 #. Current psych  med: clozapine 400 mg HS about a week or 2, olanzapine 20 mg HS, lithium 300 mg HS, lorazepam 0.5 mg QID. Reduced intrusive thoughts.  2 nights since here night eating.   Sleep 6 hours.  Limited napping during the day.   Anhedonia.  Lower motivation.  Tremor worse. M says he sleeps pretty well but awakens  BP is under control. Plan Reduce lithium to 150 mg capsule 1 at night DT level higher Reduce  olanzapine to 10 mg at night  Continue clozapine 400 mg HS bc helping SI and HI and  Continue lorazepam 0.5 mg QID, needs this and informed mother  11/08/22 appt noted:  alone and with parents. Ongoing calls between visits Doesn't  think clozapine helping him sleep as well as olanzapine for sleep or any other benefit. While at restaurant had intrusive HI/SI. Staying with parents seems to provoke intrusive HI.  Is grateful to them.  Sat woke up with them.  Sometimes goes away and sometimes not.  No know trigger and no intent. Saw PCP.  Off Wegovy.  Started metformin. No effect noted from clozapine.   M limits his lorazepam.  PCP referring him to neuro to FU abnormal head scan.  He wonders if his speech and memory problems are related.   Plan: Cont clozapine 400 mg HS Check clozapine level and then direct dosing.  11/16/22 TC from pt to disc level:  MD resp:  Clozapine level ok but may benefit with increase to 500 mg nightly.  Tell him to increase with current supply.     12/06/22 TC from pt:  complaining of worsening intrusive ego-dystonic HI/SI without intent, desire or plan.   MD resp ok to increase clozapine to 600 mg HS.  12/09/22 appt noted: Complaining of ongoing racing thoughts and worsening HI/SI without intent or desire or plan.  Racing thoughts chronic and other thoughts wax and wane.  Usually triggered if starts thinking of something negative Meds: clozapine 600 mg HS, lithium 150, lorazepam 0.5 mg QID, olanzapine 10 HS Sleep with EFA.   Will have trouble going to sleep sometimes with  thoughts. Doesn't think clozapine makes him sleepy.  Not really drowsy daytime. No dx OSA and no known apnea.  SE a little dizzy.  Constipation managed with BM every 2-3 days. Better than before.  PCP RX metformin and it helped. Plan: Plan for intrusive HI/SI wean olanzapine and trial of risperidone.  Reduce olanzapine to 5 mg HS for 1 week then stop it.  Addressed his fears of not sleeping off olanzapine. Then if sleeping start risperidone.  01/11/23 appt noted:  also seen with parents Psych meds: clozapine 600, lorazepam 0.5 mg QID, No olanzpaine ,  lithium 150 mg daily, no risperidone yet. Lost 45# on Wegovy down to 150#.   Still having intrusive HI/SI as noted before. Sleep no worse.  Takes a couple of hours after clozapine to sleep.  EFA 2-3 times nocturia.   Still racing thoughts.  Ongoing px with conc, memory,  planning Scored 73 on 100 pt test for cognition.   Some trouble with articulation and speech at times.   "If could get rid of racing and intrusive thoughts I'd be 100% better." Neuro says no stroke history and low B12 and workup ongoing at [facility], Dr. Donia Pounds. Memory is still terrible.   Plan: Start risperidone 2 mg tablet, 1 at night for racing thoughts and intrusive homicidal and suicidal thoughts. Call in 3 weeks if not better and we will increase the dose  02/09/23 appt: seen also with parents , 60 min  Lost to 152#. Meds: as above, risperidone incr to 4.5 mg HS for intrusive HI/SI SE drooling off an on.  Tremor mainly worse with a little more risperidone HI/SI intrusive thoughts wax and wane.  Worse when lays down. Takes 2 hours to fall asleep.  Needs to get OOB when awakens.  6 hours sleep. No falls.   Would rather try to increase risperidone than other options.     Psych med hx extensive including ECT and  risperidone,  Zyprexa 30 Latuda 80 which caused akathisia,  Vraylar, Rexulti, aripiprazole 20 mg with akathisia,  Seroquel 1000 mg,  InVega, Geodon, Saphris  with  side effects, symbyax, Fanapt NR .  Caplyta SE and markedly worse. Clozapine started end of March lithium 1200 SE,  lamotrigine 300 mg, Depakote 2000 mg, Tegretol, Trileptal and several of these in combinations, gabapentin,  N-acetylcysteine, Nuedexta,     Belsomra with no response,  Lunesta no response, trazodone 200 mg,  Xanax, clonazepam, lorazepam less sedation. Buspirone NR Clonidine SE with 2 trials  Viibryd 40 mg for 3 months with diarrhea,  protriptyline with side effects,  Trintellix 20 mg,  Parnate 50 mg with no response,   Emsam 12 mg for 2 months,   imipramine,  venlafaxine,   bupropion was side effects,  Lexapro 20 mg, sertraline, paroxetine, Deplin, fluoxetine 80,  fluvoxamine 150 agitated.  Confusion when with clozapine. Auvelity NR at one daily.  SE BID  methylphenidate 60 mg,  Vyvanse, Concerta, strattera, , modafinil,  Memantine worsening SI? Donepezil, complained of HI, Exelon 3 BID rivastigmine DT worsening SI  pramipexole,  amantadine ,  Patient prone to akathisia.  [facility]  HX ECT   Review of Systems:  Review of Systems  Constitutional:  Positive for appetite change and fatigue.  Cardiovascular:  Negative for chest pain and palpitations.  Gastrointestinal:  Positive for constipation.  Musculoskeletal:  Positive for back pain.  Neurological:  Positive for dizziness and tremors.       Fidgety  Psychiatric/Behavioral:  Positive for decreased concentration, dysphoric mood and suicidal ideas. Negative for agitation, behavioral problems, hallucinations, self-injury and sleep disturbance. The patient is nervous/anxious. The patient is not hyperactive.     Medications: I have reviewed the patient's current medications.  Current Outpatient Medications  Medication Sig Dispense Refill   acetaminophen (TYLENOL) 500 MG tablet Take 1,000 mg by mouth every 6 (six) hours as needed for moderate pain.     atropine 1 % ophthalmic  solution Place 2 drops under the tongue 3 (three) times daily as needed. 2 mL 1   bisacodyl (DULCOLAX) 10 MG suppository Place 1 suppository (10 mg total) rectally as needed for moderate constipation. 12 suppository 0   esomeprazole (NEXIUM) 40 MG capsule Take 40 mg by mouth daily.     fenofibrate (TRICOR) 145 MG tablet Take 145 mg by mouth daily.     fluticasone (FLONASE) 50 MCG/ACT nasal spray Place 1 spray into both nostrils daily as needed for allergies or rhinitis.     hydrocortisone (ANUSOL-HC) 2.5 % rectal cream Place 1 application rectally 3 (three) times daily as needed for hemorrhoids.     levothyroxine (SYNTHROID, LEVOTHROID) 75 MCG tablet Take 75 mcg by mouth daily before breakfast.     lisinopril (ZESTRIL) 5 MG tablet Take 5 mg by mouth daily.     lithium carbonate 150 MG capsule TAKE 1 CAPSULE BY MOUTH AT BEDTIME 30 capsule 0   LORazepam (ATIVAN) 0.5 MG tablet TAKE ONE TABLET BY MOUTH FOUR TIMES DAILY 120 tablet 1   meclizine (ANTIVERT) 25 MG tablet Take 1 tablet (25 mg total) by mouth 3 (three) times daily as needed for dizziness. 30 tablet 1   polycarbophil (FIBERCON) 625 MG tablet Take 1,250 mg by mouth daily.     risperiDONE (RISPERDAL) 3 MG tablet Take 1 tablet (3 mg total) by mouth at bedtime. (Patient taking differently: Take 4.5 mg by mouth at bedtime. For a couple of days) 30 tablet 0   risperidone (RISPERDAL) 4 MG tablet Take 2 tablets (8 mg total) by mouth 2 (two) times daily. 60 tablet 0   sildenafil (VIAGRA)  100 MG tablet Take 1 tablet (100 mg total) by mouth daily as needed for erectile dysfunction. 30 tablet 6   tamsulosin (FLOMAX) 0.4 MG CAPS capsule Take 1 capsule (0.4 mg total) by mouth daily. 90 capsule 3   WEGOVY 1 MG/0.5ML SOAJ Inject 1 mg into the skin once a week.     cloZAPine (CLOZARIL) 100 MG tablet Take 6 tablets (600 mg total) by mouth at bedtime. 84 tablet 8   No current facility-administered medications for this visit.   Medication Side Effects: Other:  mild sleepiness.   Occ twitches.\, tremor  Allergies:  Allergies  Allergen Reactions   Meloxicam Other (See Comments)    Dizziness   Nsaids Other (See Comments)    Hallucinations   Ibuprofen Other (See Comments)    Can not take because taking lithium   Prednisone Other (See Comments)    Can't sleep    Wellbutrin [Bupropion] Other (See Comments)    Suicidal thoughts    Past Medical History:  Diagnosis Date   ADHD (attention deficit hyperactivity disorder)    Anemia    Anxiety    Bipolar disorder (HCC)    BPH (benign prostatic hyperplasia)    Chronic kidney disease, stage 3b (HCC)    Constipation    DDD (degenerative disc disease), cervical    Depression    Deviated septum    ED (erectile dysfunction)    GERD (gastroesophageal reflux disease)    Graves disease    History of hiatal hernia    Hypertension    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    Hypothyroidism    Pneumonia    PONV (postoperative nausea and vomiting)     Family History  Problem Relation Age of Onset   Prostate cancer Neg Hx    Bladder Cancer Neg Hx    Kidney cancer Neg Hx     Social History   Socioeconomic History   Marital status: Single    Spouse name: Not on file   Number of children: Not on file   Years of education: Not on file   Highest education level: Not on file  Occupational History   Not on file  Tobacco Use   Smoking status: Former    Types: Cigarettes    Passive exposure: Past   Smokeless tobacco: Former    Types: Engineer, drilling   Vaping status: Never Used  Substance and Sexual Activity   Alcohol use: Not Currently   Drug use: Never   Sexual activity: Yes  Other Topics Concern   Not on file  Social History Narrative   Not on file   Social Determinants of Health   Financial Resource Strain: Low Risk  (11/03/2022)   Received from [facility], [facility]   Overall Financial Resource Strain (CARDIA)     Difficulty of Paying Living Expenses: Not very hard  Food Insecurity: No Food Insecurity (11/03/2022)   Received from [facility], [facility]   Hunger Vital Sign    Worried About Running Out of Food in the Last Year: Never true    Ran Out of Food in the Last Year: Never true  Transportation Needs: No Transportation Needs (11/03/2022)   Received from [facility], [facility]   [facility] - Transportation    In the past 12 months, has lack of transportation kept you from medical appointments or from getting medications?: No  Lack of Transportation (Non-Medical): No  Physical Activity: Not on file  Stress: Stress Concern Present (08/12/2020)   Received from [facility], [facility] - Occupational Stress Questionnaire    Feeling of Stress : Very much  Social Connections: Unknown (09/01/2021)   Received from [facility], [facility]   Social Network    Social Network: Not on file  Intimate Partner Violence: Unknown (08/06/2021)   Received from [facility], [facility]   HITS    Physically Hurt: Not on file    Insult or Talk Down To: Not on file    Threaten Physical Harm: Not on file    Scream or Curse: Not on file    Past Medical History, Surgical history, Social history, and Family history were reviewed and updated as appropriate.   Please see review of systems for further details on the patient's review from today.   Objective:   Physical Exam:  There were no vitals taken for this visit.  Physical Exam Constitutional:      General: He is not in acute distress.    Appearance: He is well-developed.  Musculoskeletal:        General: No deformity.  Neurological:     Mental Status: He is alert and oriented to person, place, and time.     Cranial Nerves: No dysarthria.     Motor: Tremor present.     Coordination: Coordination normal.     Comments: mild tremor   Psychiatric:        Attention and Perception: Attention and perception normal. He does not perceive auditory or visual hallucinations.        Mood and Affect: Mood is anxious and depressed. Affect is not labile, blunt, angry or tearful.        Speech: Speech normal. Speech is not slurred.        Behavior: Behavior normal. Behavior is not slowed. Behavior is cooperative.        Thought Content: Thought content is not delusional. Thought content includes homicidal and suicidal ideation. Thought content does not include suicidal plan.        Cognition and Memory: Cognition normal. He exhibits impaired recent memory.        Judgment: Judgment normal.     Comments: Insight fair Chronically talkative .directable. But chronically mildly pressured. He is chronically anxious   No manic signs noted. Some wordfinding problems ongoing. Intrusive thoughts episodic HI/SI are the CC Alert and oriented.      Lab Review:     Component Value Date/Time   NA 135 10/20/2022 1123   NA 142 03/20/2014 0514   K 5.2 10/20/2022 1123   K 4.1 03/20/2014 0514   CL 101 10/20/2022 1123   CL 112 (H) 03/20/2014 0514   CO2 20 10/20/2022 1123   CO2 26 03/20/2014 0514   GLUCOSE 150 (H) 10/20/2022 1123   GLUCOSE 101 (H) 08/12/2022 0029   GLUCOSE 99 03/20/2014 0514   BUN 22 10/20/2022 1123   BUN 7 03/20/2014 0514   CREATININE 1.68 (H) 10/20/2022 1123   CREATININE 0.78 03/20/2014 0514   CALCIUM 9.8 10/20/2022 1123   CALCIUM 9.5 03/20/2014 0514   PROT 6.7 08/12/2022 0029   PROT 7.3 03/18/2014 1459   ALBUMIN 4.2 08/12/2022 0029   ALBUMIN 3.2 (L) 03/18/2014 1459   AST 18 08/12/2022 0029   AST 16 03/18/2014 1459   ALT 14 08/12/2022 0029   ALT 16 03/18/2014 1459  ALKPHOS 24 (L) 08/12/2022 0029   ALKPHOS 105 03/18/2014 1459   BILITOT 0.5 08/12/2022 0029   BILITOT 0.3 03/18/2014 1459   GFRNONAA 42 (L) 08/12/2022 0029   GFRNONAA >60 03/20/2014 0514   GFRAA 54 (L) 04/18/2019 1612   GFRAA >60 03/20/2014 0514        Component Value Date/Time   WBC 4.9 02/02/2023 0954   WBC 6.8 08/12/2022 0029   RBC 4.03 (L) 02/02/2023 0954   RBC 3.72 (L) 08/12/2022 0029   HGB 12.5 (L) 02/02/2023 0954   HCT 39.1 02/02/2023 0954   PLT 417 02/02/2023 0954   MCV 97 02/02/2023 0954   MCV 89 03/20/2014 0514   MCH 31.0 02/02/2023 0954   MCH 31.5 08/12/2022 0029   MCHC 32.0 02/02/2023 0954   MCHC 33.4 08/12/2022 0029   RDW 11.9 02/02/2023 0954   RDW 12.5 03/20/2014 0514   LYMPHSABS 2.0 02/02/2023 0954   LYMPHSABS 3.2 03/20/2014 0514   MONOABS 0.5 08/12/2022 0029   MONOABS 1.0 03/20/2014 0514   EOSABS 0.3 02/02/2023 0954   EOSABS 0.4 03/20/2014 0514   BASOSABS 0.1 02/02/2023 0954   BASOSABS 0.1 03/20/2014 0514    Lithium Lvl  Date Value Ref Range Status  10/20/2022 0.4 (L) 0.5 - 1.2 mmol/L Final    Comment:    A concentration of 0.5-0.8 mmol/L is advised for long-term use; concentrations of up to 1.2 mmol/L may be necessary during acute treatment.                                  Detection Limit = 0.1                           <0.1 indicates None Detected    08/12/21 lithium level 0.9 on 450 mg.  lithium level Sept 0.8.   lithium level July 27, 2018 was normal at 1.0.   Lithium level [facility] October 03, 2018 = 1.2. Said he got lithium level at [facility] as requested.  Labs not in Epic.  Recent lipids ok except higher TG than usual.  Normal A1C.  11/10/22 Checked clozapine level on 400 mg HS= cloz 733+norclozapine 294 = total 1027  .res Assessment: Plan:    Laird was seen today for follow-up, depression, anxiety and medication reaction.  Diagnoses and all orders for this visit:  Severe bipolar I disorder, current or most recent episode depressed (HCC) -     CBC with Differential/Platelet -     cloZAPine (CLOZARIL) 100 MG tablet; Take 6 tablets (600 mg total) by mouth at bedtime.  GAD (generalized anxiety disorder)  Panic disorder with agoraphobia  Insomnia due to mental condition  Lithium  use  Long term current use of clozapine -     CBC with Differential/Platelet  Mild cognitive impairment  Low serum vitamin B12  Drug-induced constipation  Sialorrhea -     atropine 1 % ophthalmic solution; Place 2 drops under the tongue 3 (three) times daily as needed.  Other orders -     risperidone (RISPERDAL) 4 MG tablet; Take 2 tablets (8 mg total) by mouth 2 (two) times daily.   75 min appt with pt  needs a lot of time and gets parents involved in session   Chronic TR bipolar mixed and chronic anxiety.  He usually has mixed bipolar symptoms which we have not been able to completely eliminate.  See long list of meds tried.   He is  chronically unstable .  Mood is relatively stable except for the severe intrusive, ego-dystonic persistent obsessive HI/SI without intent or plan. Chronic frequent ph calls between appts often about the same issue repeatedly most recently re: intrusive ego dystonic thoughts HI/SI.  More like obsessions than true HI/SI.   Recent hosp 08/02/22 with SI and acute kidney injury and elevated lithium. Processed this and disc goal of trying to stop lithium but risk of worsening SI.  He has chronic SI and there's risk of this worsening off lithium.  Lithium and clozapine are the most effective meds for SI.   We discussed the short-term risks associated with benzodiazepines including sedation and increased fall risk among others.  Discussed long-term side effect risk including dependence, potential withdrawal symptoms, and the potential eventual dose-related risk of dementia.  But recent studies from 2020 dispute this association between benzodiazepines and dementia risk. Newer studies in 2020 do not support an association with dementia.  Unfortunately due to the severity of set his symptoms polypharmacy is a necessity.   No changes off olanzapine.   Lithium being used bc chronic SI and death thoughts.  Counseled patient regarding potential benefits, risks, and side  effects of lithium to include potential risk of lithium affecting thyroid and renal function.  Discussed need for periodic lab monitoring to determine drug level and to assess for potential adverse effects.  Counseled patient regarding signs and symptoms of lithium toxicity and advised that they notify office immediately or seek urgent medical attention if experiencing these signs and symptoms.  Patient advised to contact office with any questions or concerns.  Reduced lithium to 150 mg daily Dt hx toxicity Checked lithium level and BMP and level 1.1 on 300 mg Daily so reduced again to 150 mg daily and level 0.4 Call if death thoughts worsen or worsening SI.  Disc clozapine and it's level..  Disc data showing less SI with it but he's had no changes.    Disc risk in detail including low WBC with complication, myocarditis.  Extensive discussion of CBC monitoring.  Disc going higher despite level and checking ECG bc cardiac risk at higher doses.   Disc this again bc taking Wegovy might help him to tolerate it better.   Cont clozapine 600 mg HS.  Started 12/07/22. Started clozapine 08/05/22. 11/10/22 Checked clozapine level on 400 mg HS= cloz 733+norclozapine 294 = total 1027 Defer increase. CBC every 2 weeks  Did he take duloxetine?  Might help depression more.  Consider retry pramipexole off label   But for now continue lorazepam 0.5 mg QID prn and lately BID-TID prn,  but use LED for cog reasons M administers  For [facility] had to stop rivastigmine DT worsening SI. Neuro [facility] Dr. Donia Pounds, [facility] in progress.    We discussed the short-term risks associated with benzodiazepines including sedation and increased fall risk among others.  Discussed long-term side effect risk including dependence, potential withdrawal symptoms, and the potential eventual dose-related risk of dementia.  But recent studies from 2020 dispute this association between benzodiazepines and dementia risk. Newer studies in 2020 do not support  an association with dementia.  Discussed safety plan at length with patient.  Advised patient to contact office with any worsening signs and symptoms.  Instructed patient to go to the [facility] emergency room for evaluation if experiencing any acute safety concerns, to include suicidal intent.  He commits to safety.  Disc Ozempic  and Monjauro for weight loss.  Has had weight gain from meds as a contributor. Lost 45# with Wegovy and now 150#.  Needs to keep up activity as much as possible for depression.  For severe constipation seeing GI Will not make further changes to address this. Had constipation before clozapine and GI appt in June started Amitza which helped some. Needs to push fluids.    Historically he has not done well with serotonergic medicines which would typically be used for obsessive thoughts.  Therefore consider a stronger dopamine antagonist in place of olanzapine.   BC blood level of clozapine reluctant to increase unless we have to so ... Plan for intrusive HI/SI trial of risperidone.   AVS: continue risperidone 4.5 mg tablet for a full week,  for racing thoughts and intrusive homicidal and suicidal thoughts. If not better in a week then increase to 6 mg daily.   After a week if not better increase to 8 mg to max options.  Disc SE  Explained plan  Consider ECT for TR.  He's open to it but no decision today.  Disc memory concerns and mother's memory concerns bc had it before.  Disc counseling.  [email or web address] for therapist.  Most effective therapist will need to be expert in CBT  Disc getting second opinion again.  They are interested.  Will pursue Dr. Wellington Hampshire.  Sent email to see if he's available.  FU 4 weeks   Meredith Staggers, MD, DFAPA  Future Appointments  Date Time Provider Department Center  03/17/2023  1:00 PM Cottle, Steva Ready., MD [facility] None  04/12/2023  1:30 PM Cottle, Steva Ready., MD [facility] None  12/29/2023  1:30 PM Sondra Come, MD [facility]  None    Orders Placed This Encounter  Procedures   CBC with Differential/Platelet       -------------------------------

## 2023-02-09 NOTE — Telephone Encounter (Signed)
Pharmacy called to say we need to update his REMS to every two weeks due to change in Clozapine. Pt was seen today.

## 2023-02-09 NOTE — Patient Instructions (Addendum)
Disc options:  risperidone, increasing clozapine, ECT, Dr. Wellington Hampshire  Atropine drops: 1 drop on each side of tongue 3 times a day as needed for drooling. continue risperidone 4.5 mg tablet for a full week,  for racing thoughts and intrusive homicidal and suicidal thoughts. If not better in a week then increase to 6 mg daily.   After a week if not better increase to 8 mg to max options.   [email or web address] for therapist

## 2023-02-09 NOTE — Telephone Encounter (Signed)
Labs in REMS, Dr. Jennelle Human aware.

## 2023-02-10 ENCOUNTER — Encounter: Payer: Medicare PPO | Admitting: Speech Pathology

## 2023-02-10 LAB — CBC WITH DIFFERENTIAL/PLATELET
Basophils Absolute: 0.1 10*3/uL (ref 0.0–0.2)
Basos: 1 %
EOS (ABSOLUTE): 0.2 10*3/uL (ref 0.0–0.4)
Eos: 4 %
Hematocrit: 41.1 % (ref 37.5–51.0)
Hemoglobin: 13 g/dL (ref 13.0–17.7)
Immature Grans (Abs): 0 10*3/uL (ref 0.0–0.1)
Immature Granulocytes: 0 %
Lymphocytes Absolute: 1.5 10*3/uL (ref 0.7–3.1)
Lymphs: 32 %
MCH: 31 pg (ref 26.6–33.0)
MCHC: 31.6 g/dL (ref 31.5–35.7)
MCV: 98 fL — ABNORMAL HIGH (ref 79–97)
Monocytes Absolute: 0.3 10*3/uL (ref 0.1–0.9)
Monocytes: 6 %
Neutrophils Absolute: 2.7 10*3/uL (ref 1.4–7.0)
Neutrophils: 57 %
Platelets: 403 10*3/uL (ref 150–450)
RBC: 4.19 x10E6/uL (ref 4.14–5.80)
RDW: 11.8 % (ref 11.6–15.4)
WBC: 4.8 10*3/uL (ref 3.4–10.8)

## 2023-02-14 ENCOUNTER — Telehealth: Payer: Self-pay | Admitting: Psychiatry

## 2023-02-14 ENCOUNTER — Other Ambulatory Visit: Payer: Self-pay

## 2023-02-14 MED ORDER — RISPERIDONE 4 MG PO TABS: 4.0000 mg | ORAL_TABLET | Freq: Two times a day (BID) | ORAL | 0 refills | Status: DC

## 2023-02-14 NOTE — Telephone Encounter (Signed)
Clarification needed for dosing of Risperdal 4 mg due to printed Rx incorrect from 10/09 visit.   Verified with Dr. Jennelle Human pt is taking 4.5 mg Risperidone, he is/was to increase to 6 mg after a week if no benefit, then increase to 8 mg after the next week if still not seeing a benefit.   Rx submitted to [facility].    FYI for [facility] as well as me in case pt calls back with further questions.

## 2023-02-14 NOTE — Telephone Encounter (Signed)
Pt received a printed script for Risperdal 4mg  at his visit on 10/9.  He is asking to have Dr Jennelle Human send the script in electronically instead to   [facility] - [place], [place] - [address] [place], [place] [address] Phone: [phone]  Fax: [phone]   He said he will keep the other one for another time.    Next appt 11/14

## 2023-02-14 NOTE — Telephone Encounter (Signed)
My last note said the following:  continue risperidone 4.5 mg tablet for a full week,  for racing thoughts and intrusive homicidal and suicidal thoughts. If not better in a week then increase to 6 mg daily.   After a week if not better increase to 8 mg to max options.   So he needed only #14 risperidone 3 mg tablets to take for a week.  Does he not have #14 risperidone 3 mg tablets?  If not cancel the 4 mg tablets and send in RX #30 of risperidone tablets with instructions 1 BID.

## 2023-02-14 NOTE — Telephone Encounter (Signed)
Albert [name] did call back at 3:55.  He knows the prescription for 4mg  twice a day is at the pharmacy, but he needs to 6mg  first.  If the 6mg  works, he won't need to take 8mg .  He needs to try 6mg  a day first.  Please send in a prescription for 6mg  per day to [facility]

## 2023-02-14 NOTE — Telephone Encounter (Signed)
Pt called back. Send in different strength too?

## 2023-02-15 ENCOUNTER — Encounter: Payer: Medicare PPO | Admitting: Speech Pathology

## 2023-02-15 NOTE — Telephone Encounter (Signed)
Pt called and he needs 3 mg of resperidone for 4 days. He only has enough thru Friday and he needs to try 6 mg for a week which will be nest tuesday

## 2023-02-15 NOTE — Telephone Encounter (Signed)
Called patient and he said he misspoke yesterday. He is currently taking 4.5, wants to increase to 6 mg.

## 2023-02-16 ENCOUNTER — Other Ambulatory Visit: Payer: Self-pay

## 2023-02-16 MED ORDER — RISPERIDONE 3 MG PO TABS: 3.0000 mg | ORAL_TABLET | Freq: Every day | ORAL | 0 refills | Status: DC

## 2023-02-16 MED ORDER — RISPERIDONE 3 MG PO TABS: 6.0000 mg | ORAL_TABLET | Freq: Every day | ORAL | 0 refills | Status: DC

## 2023-02-16 NOTE — Telephone Encounter (Signed)
Sent!

## 2023-02-17 ENCOUNTER — Encounter: Payer: Medicare PPO | Admitting: Speech Pathology

## 2023-02-21 ENCOUNTER — Other Ambulatory Visit: Payer: Self-pay | Admitting: Psychiatry

## 2023-02-21 DIAGNOSIS — F314 Bipolar disorder, current episode depressed, severe, without psychotic features: Secondary | ICD-10-CM

## 2023-02-21 DIAGNOSIS — Z79899 Other long term (current) drug therapy: Secondary | ICD-10-CM

## 2023-02-21 NOTE — Telephone Encounter (Signed)
LV10/9; LF 09/24

## 2023-02-22 ENCOUNTER — Encounter: Payer: Medicare PPO | Admitting: Speech Pathology

## 2023-02-24 LAB — CBC WITH DIFFERENTIAL/PLATELET
Basophils Absolute: 0.1 10*3/uL (ref 0.0–0.2)
Basos: 1 %
EOS (ABSOLUTE): 0.2 10*3/uL (ref 0.0–0.4)
Eos: 4 %
Hematocrit: 38.8 % (ref 37.5–51.0)
Hemoglobin: 12.7 g/dL — ABNORMAL LOW (ref 13.0–17.7)
Immature Grans (Abs): 0 10*3/uL (ref 0.0–0.1)
Immature Granulocytes: 0 %
Lymphocytes Absolute: 1.6 10*3/uL (ref 0.7–3.1)
Lymphs: 39 %
MCH: 30.2 pg (ref 26.6–33.0)
MCHC: 32.7 g/dL (ref 31.5–35.7)
MCV: 92 fL (ref 79–97)
Monocytes Absolute: 0.3 10*3/uL (ref 0.1–0.9)
Monocytes: 8 %
Neutrophils Absolute: 2 10*3/uL (ref 1.4–7.0)
Neutrophils: 48 %
Platelets: 384 10*3/uL (ref 150–450)
RBC: 4.2 x10E6/uL (ref 4.14–5.80)
RDW: 12.1 % (ref 11.6–15.4)
WBC: 4.2 10*3/uL (ref 3.4–10.8)

## 2023-03-02 ENCOUNTER — Telehealth: Payer: Self-pay | Admitting: Psychiatry

## 2023-03-02 NOTE — Telephone Encounter (Signed)
Patient has been taking Risperdal 4 mg BID for a week now. He reports a little improvement in intrusive thoughts, but they are still present. He reports that as soon as he wakes up the thoughts start and persist throughout the day. He is not reporting any side effects, such as sleepiness.  He said it is easier for him to take at night than BID.  He has FU 11/14.

## 2023-03-02 NOTE — Telephone Encounter (Signed)
Patient Albert [name] at 10:58 stating that he is having problems with Risperdal. He is taking in the morning and would like to know if he should be taking at night or when he should take. Pls rtc [phone]

## 2023-03-02 NOTE — Telephone Encounter (Signed)
I don't want to go higher than risperidone 4 mg BID until his appt and we discuss it.  This is the usual max dose.  Better to keep it BID if possible but if forgets am dose then can take it as 8 mg HS

## 2023-03-07 NOTE — Telephone Encounter (Signed)
Albert [name] called at 9:18 to report that taking the risperidone in the morning and again at night doesn't help that well.  He would like to take all 8 at night.  Please call to let him know is ok.

## 2023-03-08 NOTE — Telephone Encounter (Signed)
Noted will confirm with Dr. Jennelle Human to make sure okay if he tolerates it better.

## 2023-03-08 NOTE — Telephone Encounter (Signed)
Rtc to pt to discuss him taking all of his Risperdal at hs instead of bid. Left pt a message to call back.

## 2023-03-08 NOTE — Telephone Encounter (Signed)
Agreed.  The last changes neeed more time so no further med changes until his appt.

## 2023-03-08 NOTE — Telephone Encounter (Signed)
Rtc to pt and discussed his dosing of Risperdal 4 mg tablets, instructions were twice a day. Informed him that Dr. Jennelle Human recommended he split doses up as long as the am dose didn't make him drowsy which it doesn't. Pt asked if he could take all at night, advised him if he felt like he needed to try that for a few nights he could do so. Informed him spreading it out would be better but he prefers to try that way first. He has apt on 11/14, he asked if there was anything else Dr. Jennelle Human would give him for the short term, I informed him there was not anything else Dr. Jennelle Human would change or adjust until his next apt. He verbalized understanding although not liking that answer.

## 2023-03-09 ENCOUNTER — Other Ambulatory Visit: Payer: Self-pay | Admitting: Psychiatry

## 2023-03-10 LAB — CBC WITH DIFFERENTIAL/PLATELET
Basophils Absolute: 0.1 10*3/uL (ref 0.0–0.2)
Basos: 2 %
EOS (ABSOLUTE): 0.2 10*3/uL (ref 0.0–0.4)
Eos: 4 %
Hematocrit: 40.3 % (ref 37.5–51.0)
Hemoglobin: 13 g/dL (ref 13.0–17.7)
Immature Grans (Abs): 0 10*3/uL (ref 0.0–0.1)
Immature Granulocytes: 0 %
Lymphocytes Absolute: 2.1 10*3/uL (ref 0.7–3.1)
Lymphs: 46 %
MCH: 30.3 pg (ref 26.6–33.0)
MCHC: 32.3 g/dL (ref 31.5–35.7)
MCV: 94 fL (ref 79–97)
Monocytes Absolute: 0.4 10*3/uL (ref 0.1–0.9)
Monocytes: 8 %
Neutrophils Absolute: 1.9 10*3/uL (ref 1.4–7.0)
Neutrophils: 40 %
Platelets: 446 10*3/uL (ref 150–450)
RBC: 4.29 x10E6/uL (ref 4.14–5.80)
RDW: 12.3 % (ref 11.6–15.4)
WBC: 4.7 10*3/uL (ref 3.4–10.8)

## 2023-03-15 ENCOUNTER — Other Ambulatory Visit: Payer: Self-pay | Admitting: Psychiatry

## 2023-03-15 NOTE — Telephone Encounter (Signed)
Has appt tomorrow 11/14

## 2023-03-16 ENCOUNTER — Encounter: Payer: Self-pay | Admitting: Psychiatry

## 2023-03-17 ENCOUNTER — Ambulatory Visit: Payer: Medicare PPO | Admitting: Psychiatry

## 2023-03-21 ENCOUNTER — Other Ambulatory Visit: Payer: Self-pay | Admitting: Psychiatry

## 2023-03-21 DIAGNOSIS — F4001 Agoraphobia with panic disorder: Secondary | ICD-10-CM

## 2023-03-21 DIAGNOSIS — F411 Generalized anxiety disorder: Secondary | ICD-10-CM

## 2023-03-21 NOTE — Telephone Encounter (Signed)
LF 10/7; LV 10/9 NV 12/10

## 2023-03-23 ENCOUNTER — Other Ambulatory Visit: Payer: Self-pay | Admitting: Psychiatry

## 2023-03-24 LAB — CBC WITH DIFFERENTIAL/PLATELET
Basophils Absolute: 0.1 10*3/uL (ref 0.0–0.2)
Basos: 2 %
EOS (ABSOLUTE): 0.2 10*3/uL (ref 0.0–0.4)
Eos: 4 %
Hematocrit: 38.6 % (ref 37.5–51.0)
Hemoglobin: 12.8 g/dL — ABNORMAL LOW (ref 13.0–17.7)
Immature Grans (Abs): 0 10*3/uL (ref 0.0–0.1)
Immature Granulocytes: 0 %
Lymphocytes Absolute: 2.2 10*3/uL (ref 0.7–3.1)
Lymphs: 42 %
MCH: 30.7 pg (ref 26.6–33.0)
MCHC: 33.2 g/dL (ref 31.5–35.7)
MCV: 93 fL (ref 79–97)
Monocytes Absolute: 0.3 10*3/uL (ref 0.1–0.9)
Monocytes: 6 %
Neutrophils Absolute: 2.4 10*3/uL (ref 1.4–7.0)
Neutrophils: 46 %
Platelets: 404 10*3/uL (ref 150–450)
RBC: 4.17 x10E6/uL (ref 4.14–5.80)
RDW: 12.6 % (ref 11.6–15.4)
WBC: 5.2 10*3/uL (ref 3.4–10.8)

## 2023-03-28 ENCOUNTER — Other Ambulatory Visit: Payer: Self-pay | Admitting: Psychiatry

## 2023-03-28 DIAGNOSIS — Z79899 Other long term (current) drug therapy: Secondary | ICD-10-CM

## 2023-03-28 DIAGNOSIS — F314 Bipolar disorder, current episode depressed, severe, without psychotic features: Secondary | ICD-10-CM

## 2023-03-30 ENCOUNTER — Telehealth: Payer: Self-pay | Admitting: Psychiatry

## 2023-03-30 NOTE — Telephone Encounter (Signed)
Received after hours call from patient. He reports that he has had intrusive "suicidal thoughts and homicidal thoughts." He reports that suicidal and homicidal thoughts "haven't gone away" after being persistent for several days. He was seen at [facility] in [place] today and they "did not have any doctors... and didn't feel like I needed to be committed." He reports that he was advised by [facility] to go to [facility] or [facility] if he did not feel safe. He reports that he prefers not to return to [facility] if possible. He reports that suicidal thoughts increased while at [facility].   He reports that he does not want to act on suicidal and homicidal thoughts and feels he needs to go to the hospital. Agreed with plan to go to [facility] ER since he does not feel safe and feels that he needs hospitalization. He reports that his brother has offered to drive him to [facility]. (Brother and mother present at time of call and are heard talking in the background). Pt agrees to go to [facility] and states he is concerned about worsening anxiety during ride to [facility]. He reports that he took Ativan 0.5 mg about 45 minutes ago and that was the third time he has taken Ativan today. Recommended having an Ativan available if needed in the event that anxiety worsens while being transported to the hospital. Discussed that script is written for up to 4 times daily. Advised pt to call back with any questions or if additional assistance is needed.

## 2023-04-05 ENCOUNTER — Telehealth: Payer: Self-pay | Admitting: Psychiatry

## 2023-04-05 NOTE — Telephone Encounter (Signed)
The risperidone is not helping.  We will wean him off.  Drop risperidone to 1 of the 4 mg tablets nightly until his appt next week.  Albert [name] is complicated and I am not sure what to do next.  Since Dr. Quintella Reichert is not available for a second opinion, I suggest they talk to his neurologist at [facility] about referrral for a second psychiatric opinion at [facility].  I don't know anyone there.  The neurologist may know some of the psychiatrists there.

## 2023-04-05 NOTE — Telephone Encounter (Signed)
Correct # for mom is [phone]. Notified her of recommendations to decrease the risperidone and also to ask neurologist for psychiatrist second opinion.

## 2023-04-05 NOTE — Telephone Encounter (Signed)
Albert [name], Albert [name], called at 1:15 to report that Albert [name] is not doing well at all.  She doesn't feel the medications are working.  They are not helping him at all.  He is not sleeping.  He wakes up with intrusive thoughts and can't go back to sleep.  Has intrusive thoughts through the day almost every day.  He just lays on the sofa and watches TV.  He does not interact or talk with anyone.  He needs some help.  He has appt 12/10 but needs to be seen asap.  He is on the CXL.  He is scheduled for a brain MRI 12/11.  Albert [name] wants someone to call back on her phone.  Albert [name] is staying at her house. But they needs advice on what to do until  he can be seen.  Her # is [phone]

## 2023-04-05 NOTE — Telephone Encounter (Signed)
Please see message from patient's mother and advise.  Last Hgb 12.8 from 11/20. I know this is minimally low and probably doesn't account for the intrusive thoughts, but ? If could be contributing to wanting to lay around. Last ANC 2.4 on 11/20. Currently taking 600 mg clozapine. Last clozapine level 733 7/10. Clozapine level 733 + norclozapine 294 = 1027 on 400 mg daily. Noted elsewhere increase clozapine to 500 mg HS  Renal panel 9/5 revealed glucose 122, Cr 1.36. Unsure if fasting.   See after hours call on 11/27 from [place]. I don't see any documentation of hospitalization or evaluation.

## 2023-04-06 ENCOUNTER — Other Ambulatory Visit: Payer: Self-pay | Admitting: Psychiatry

## 2023-04-07 LAB — CBC WITH DIFFERENTIAL/PLATELET
Basophils Absolute: 0.1 10*3/uL (ref 0.0–0.2)
Basos: 1 %
EOS (ABSOLUTE): 0.2 10*3/uL (ref 0.0–0.4)
Eos: 5 %
Hematocrit: 39.8 % (ref 37.5–51.0)
Hemoglobin: 13.1 g/dL (ref 13.0–17.7)
Immature Grans (Abs): 0 10*3/uL (ref 0.0–0.1)
Immature Granulocytes: 1 %
Lymphocytes Absolute: 1.9 10*3/uL (ref 0.7–3.1)
Lymphs: 44 %
MCH: 31.1 pg (ref 26.6–33.0)
MCHC: 32.9 g/dL (ref 31.5–35.7)
MCV: 95 fL (ref 79–97)
Monocytes Absolute: 0.3 10*3/uL (ref 0.1–0.9)
Monocytes: 8 %
Neutrophils Absolute: 1.7 10*3/uL (ref 1.4–7.0)
Neutrophils: 41 %
Platelets: 393 10*3/uL (ref 150–450)
RBC: 4.21 x10E6/uL (ref 4.14–5.80)
RDW: 12.8 % (ref 11.6–15.4)
WBC: 4.2 10*3/uL (ref 3.4–10.8)

## 2023-04-12 ENCOUNTER — Encounter: Payer: Self-pay | Admitting: Psychiatry

## 2023-04-12 ENCOUNTER — Telehealth: Payer: Medicare PPO | Admitting: Psychiatry

## 2023-04-12 DIAGNOSIS — F411 Generalized anxiety disorder: Secondary | ICD-10-CM | POA: Diagnosis not present

## 2023-04-12 DIAGNOSIS — F4001 Agoraphobia with panic disorder: Secondary | ICD-10-CM

## 2023-04-12 DIAGNOSIS — F314 Bipolar disorder, current episode depressed, severe, without psychotic features: Secondary | ICD-10-CM | POA: Diagnosis not present

## 2023-04-12 DIAGNOSIS — F428 Other obsessive-compulsive disorder: Secondary | ICD-10-CM

## 2023-04-12 DIAGNOSIS — F5105 Insomnia due to other mental disorder: Secondary | ICD-10-CM | POA: Diagnosis not present

## 2023-04-12 DIAGNOSIS — G3184 Mild cognitive impairment, so stated: Secondary | ICD-10-CM

## 2023-04-12 MED ORDER — CLOZAPINE 100 MG PO TABS: 500.0000 mg | ORAL_TABLET | Freq: Every day | ORAL | 2 refills | Status: DC

## 2023-04-12 MED ORDER — FLUVOXAMINE MALEATE 100 MG PO TABS: ORAL_TABLET | ORAL | 0 refills | Status: DC

## 2023-04-12 NOTE — Patient Instructions (Addendum)
Reduce risperidone to 1/2 of 4 mg tablet in evening for 1 week then stop it. Reduce clozapine to 5 tablets nightly Start fluvoxamine 100 mg tablet 1/2 tablet nightly for 1 week then 1 tablet nightly

## 2023-04-12 NOTE — Progress Notes (Signed)
JERMARI BEGNAUD [id number] 12/05/1965 57 y.o.   Video Visit via My Chart  I connected with pt by video using My Chart and verified that I am speaking with the correct person using two identifiers.   I discussed the limitations, risks, security and privacy concerns of performing an evaluation and management service by My Chart  and the availability of in person appointments. I also discussed with the patient that there may be a patient responsible charge related to this service. The patient expressed understanding and agreed to proceed.  I discussed the assessment and treatment plan with the patient. The patient was provided an opportunity to ask questions and all were answered. The patient agreed with the plan and demonstrated an understanding of the instructions.   The patient was advised to call back or seek an in-person evaluation if the symptoms worsen or if the condition fails to improve as anticipated.  I provided 60 minutes of video time during this encounter.  The patient was located at home and the provider was located office. Session 130-230 pm   Subjective:   Patient ID:  ANDREL BONNET is a 58 y.o. (DOB Jan 23, 1966) male.  Chief Complaint:  Chief Complaint  Patient presents with   Follow-up   Depression   Anxiety   Other    Intrusive HI/SI obsessive thoughts ongoing    Depression        Associated symptoms include decreased concentration, fatigue, appetite change and suicidal ideas.  Past medical history includes anxiety.   Anxiety Symptoms include decreased concentration, dizziness, nervous/anxious behavior and suicidal ideas. Patient reports no chest pain or palpitations.      [name] presents to the office today for follow-up of mixed bipolar, anxiety and still grieving stress of breakup.  He requires frequent follow-up because of long-term symptoms.  At visit October 25, 2018.  We made several medicine changes because of his concerns about sleep and other issues.  We  increased olanzapine back to 7.5 mg bc not sleeping as well at 5 mg.  He has to have the prescription for quetiapine written for up to 3 tablets at night in case insomnia is worse because insomnia makes his mood disorder so much worse.  We discussed that was above the usual max but the request was granted given his treatment resistant status. We also reduce lithium from 900 mg daily to 750 mg daily to try to reduce tremor and muscle twitches.  At  visit March 28, 2019.  Fluoxetine was increased to 40 mg daily.   Early January 2021 increased to 60 mg daily.  No SE.  seen June 28, 2019.  No further meds were changed except olanzapine was increased back to 10 mg daily to see if if he could get additional benefit per his request.  Covid vaccinated.  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  Then had cholecystectomy.  April 2021 appt with the following noted: 2 episodes night sweats lately.  Memory has been very bad lately.  Repeatedly asks mother questions. Still  tend to stay in his room and his bed.  Still has rapid cycling mood swings.  Maybe some better with increase in olanzapine to 10 and tolerating itl.  Would like to get out more but can't DT Covid.  Can perform necessary chores.  Will get out of the house when he can.  No change in death thoughts and anxiety in intensity but is better with frequency.  Usually comes and goes in waves  but more persistent.  Consistent with meds.  Ativan not helping anxiety very dramatically but he's not sure.  Fidgety.  No trigger other than still grieving relationship and can't get it out of his head.  Poor energy, concentration and more forgetful. In bed more and less active.  Sleep ok lately which is unusual.  Can concentrate on financial matters and stock market at times.  Tolerating meds.  Still some intrusive SI without reason. Less frequent obsessive thoughts about broken relationship and has been doing this for months.  Can't let it go.  Loop.   No unusual  stress even with the family who is supportive. Likes the benefit that Zyprexa gives. No sleepwalking nor falling nor odd behavior.  No history of sleep walking.  He understands this may recur with an increase.  Also wants option to rarely take extra quetiapine 300 for sleep prn. Plan:  Try to reduce lorazepam if possible.  10/22/2019 appointment with the following noted: Continues fluoxetine 60, lithium 600 mg, lorazepam 2 mg AM and HS and 1 mg midday, olanzapine 10, quetiapine 600 mg HS. Still anxious chronically and including driving in crowded spaces.  Doesn't thing Ativan helps as well as Xanax but less cognitive problems. Sleep is not as good.  Occ EFA.  More EMA and wanting to do things in the middle of the night but this is not typical.  Wonders about why that happens.  Some napping.   Tolerating meds.  Asks about weight loss meds.  Disc this in detail.   Plan no changes except OK meclizine prn vertigo.  02/11/20 appt with the following noted: Able to gradually reduce lorazepam to 1 mg AM and HS. More depressed over time and less interested in things and less interest in going out but does with his parents. Has reduced from 800 to 600 mg HS with some awakening but usually able to go back to sleep.[name]  Spending a good amount of time in bed bc watches TV in bedroom.  Parents watch TV in different part of the house.  No mood swings he notices.   Concerns about weight gain about 200#. Likes olanzapine's benefit for sleep. Plan: Trial for TRD to  Increase fluoxetine to 80 mg daily  to use the combo with olanzapine for TR bipolar depression.   04/21/20 appt with following noted: I thought in beginning some benefit with fluoxetine.  Lifelong negative thinking continues.   Not much bipolar.  Reads a lot on bipolar.   Still tired and anxious a little more may be seasonal.  No familial stressors.  Tends to lay down in afternoon.  Anxiety not over anything in particular.   No SE with  fluoxetine. Took meclizine prn.  Easily motion sick.    Asks questions about newer drugs for bipolar like Caplyta. Plan:  No med changes  06/30/20 appt noted: Tired of wearing masks.  Vaccinated.  Asked questions about when this will end with mask mandate.  Mood up and down some since here and getting up 2-3 times.  Disc awakening problems.  Trying to do better bc night eating some.   Mood is about average today so far.  [name] out with friends for breakfast.  Recognizes activity helps mood.   3 days in a row napped a lot in the afternoon.   Bed is a comfort place.  Gets bored and hard to motivate.  Tries to stay away from night eating and spending.   More depressed when alone and wonders  about med changes. Plan: Failed response to olanzapine + fluoxetine for TR bipolar depression.   Reduce fluoxetine to 1 daily and reduce olanzapine to one half nightly for 10 days and then stop it. Then start Caplyta 1 daily  08/27/2020 appointment with the following noted: Patient decompensated with the above switch to Caplyta with intrusive suicidal thoughts and had to be hospitalized for psychiatric reasons.  Multiple phone calls with family members since that time.  Spoke with the psychiatric nurse practitioner with the decision to restart olanzapine in place of the Caplyta given the patient was more stable while on the combination of Seroquel and olanzapine than with the Caplyta. Caplyta triggered HI/SI and depressed and confused on it, and agitated and still has some of it now. No akathisia. Frustrated with lack of therapy at the hospital.   Hydroxyzine didn't help jitteriness.  Feels some better.  Fleeting SI & HI but not obsessive like it was prior to hospitalization.  Feels a little amped up and nervous.  Scared of what could happen.  Apprehensive being here talking about things. 5-6 hours sleep last night and would like to have more. At mom and dad's house right now and up more in the day. Inadvertently  stopped lorazepam abruptly by mistake likely contributing to shakiness. Still some memory issues.  Trying to stay out of bed when watching TV.  Some awakening but managing. Occ fleeting SI and distracts himself.   Always spends a lot of time just laying around.   Can have anxiety for no reason like coming here, and varies in intensity without pattern.      Less panic than in the past.  Some chronic depression and hyperactivity and loudness and hyperverbal.  Usually back to sleep.  Total 6 hours but some napping, awakens 2-4 times nightly but back to sleep..  Taking quetiapine 600 mg HS.  still lacks interest and motivation.  Can follow a TV show if interested. Plan: Restart lorazepam 1 mg in the morning, 1 mg in the afternoon and 1 mg at night for anxiety Stop hydroxyzine  Increase olanzapine to 1 and 1/2 of 10 mg tablets in evening  09/05/2020 appt noted: seen with parents today at his request Made med changes noted and tolerated the changes Better than I did last week. Now only fleeting HI/SI and "no where near what it was".  Sleeping better.  Still not a lot of energy.  Anhedonia.  Less agitation.  A lot of anxiety all the time but it's helping.  Would like more energy and motivation.   Doesn't handle stress well. Parents note he's less shakey.  He's still staying with his parents.  Only driven once since this happened and F wants him to be able to drive and function more independently.  M agrees he's better.  Memory is better but not normal.  Primarily STM problems No akathisia with olanzapine this time.    Administering his own meds but mo watched. Slept 8 hours last night. Plan consider further reductions in quetiapine  10/17/20 appt noted: Stayed on Seroquel 600 HS and olanzapine 15. Olanzapine seemed to do most for the sleep.  Also helping eliminated HI/SI for the most part.  Wants to try to increase it.  Depression is much better with less rumination also.  Fears akathisia at higher dose as in  the past. Also on fluoxetine 40.   Plan: Reduce Seroquel to 1 and 1/2 tablets at night Increase olanzapine to 1 of the 15 mg  tablet and 1/2 of the 5 mg tablet for 1 week,  Then, if tolerated increase the olanzapine to 20 mg or 1 of the 15 mg tablets and 1 of the 5 mg tablets. If possible then also reduce quetiapine to 1 tablet at night.  11/05/2020 appointment with the following noted: Up to olanzapine 20 mg hs for 3 days.  Reduced Serooquel to 450 mg HS.  Didn't try to go lower. Had hiatal hernia and umbilical hernia surgery recently with no problem.   No akathisia so far. Sleep ok with meds so far changes.  No mood changes yet but overall likes the smoothness of olanzapine and helping sleep without knociing him out. Still some occ HI/SI, he thinks bc of anxiety generally.  Easily anxious. Plan: Continue olanzapine 20 mg nightly longer to give it time to help mood sx reduce quetiapine to 1 tablet at night to minimize polypharmacy and reduce risk of akathisia.  01/07/21 appt noted: Able to reduce quetiapine 300 mg HS ok with decent sleep but still wakes a lot.  No AM hangover.  Don't have a lot to do.  Can enjoy going out and doing things.  But doesn't do it longterm.  Family is doing good.  Father started year 70 at his job.   Tolerating the meds well.  May wake more with quetiapine reduction. Lost 10#. Wonders if could try other meds he didn't tolerate before bc now can tolerate olanzapine and couldn't tolerate it in the past DT akathisia. Mood never great but can swing into depresssion without reason.   Plan: Continue olanzapine 20 mg nightly longer to give it time to help mood sx reduce quetiapine to 1/2 of the 300 mg  tablet at night to minimize polypharmacy and reduce risk of akathisia.  02/02/2021 appointment with the following noted: Tried reduced quetiapine to 150 mg and after a week had withdrawal sx including jittery and SI and he increased to 250 mg daily.  Felt better after  increasing to 300 mg daily and last week reduced to 250 mg daily.  After increase quetiapine felt better in a few days.  Seems more alert with less quetiapine but can't tolerate a quick withdrawal.   Some awakening.  Plan: Reduce quetiapine by 50 mg every 2-4 weeks.  03/09/2021 phone call: Pt called and said that he is weaning off the seroquel. He was down to 200 mg of the seoquel and he started having withdrawal symptoms and sucidal thoughts. He went back up to 250 mg because he knew he was good on that.    Pt stated he starting back taking 250 mg on Saturday due to the symptoms he was having.Now he is no longer having suicidal thoughts but still very jittery. MD: He may just need to taper slower than most people.  If he can tolerate the 250 mg Seroquel with the jitteriness it should get better over the next 1 to 2 weeks.  Then he can try going down again by 50 mg at a time.  He probably needs to go down by just 50 mg every 2 to 4 weeks and wait till he fully adjusts to each reduction before he reduces again.  03/17/21 appt noted: Hernia surgery 2 weeks ago. Seroquel 250 mg for a month then 200 mg daily and had SI and increased again to 300 mg daily.  Would love to get off it.  Withdrawal triggers intrusive HI/SI but otherwise doesn't have them No SE. Sleep is OK but not  enough REM sleep.  Satisfied with other meds.   No current HI/SI.  No paranoia.  Still has anxiety and taking Ativan every 6 hours.  It works but doesn't cure it. Chronic anxiety and ask about ketamine for chronic depression. Plan: Because he had WD reducing from 250 to 200 mg daily will have to go slowly. Reduce quetiapine by 25 mg every 2-4 weeks. Reduce Seroquel to 250 mg for 2 weeks, Then reduce to 225 mg for 2 weeks, Then reduce to 200 mg for 2 weeks, Then reduce to 175 mg for 2 weeks, Then reduce to 150 mg Also: Buspirone 30 mg tablets for anxiety Start 1/3 tablet twice daily for 1 week Then increase to two thirds  twice daily for 1 week Then increase to 1 tablet twice daily  04/15/2021 appointment with the following noted: Reduced Seroquel to 200 mg HS and increased buspirone to 30 mg BID STM px and wants to try to reduce Ativan to see if it is better.  But fears with drawal sx. Parents complain about his memory. Less HI and SI since here. Less depressed. Plan: Buspirone 30 mg tablets for anxiety Start 1/3 tablet twice daily for 1 week Then increase to two thirds twice daily for 1 week Then increase to 1 tablet twice daily Continue olanzapine 20 mg nightly longer to give it time to help mood sx Because he had WD reducing from 250 to 200 mg daily will have to go slowly. Reduce quetiapine by 25 mg every 2-4 weeks. Reduce Seroquel to 250 mg for 2 weeks, Then reduce to 225 mg for 2 weeks, Then reduce to 200 mg for 2 weeks, Then reduce to 175 mg for 2 weeks, Then reduce to 150 mg Trial taper lorazepam: In hopes of improving short-term memory  04/30/2022 phone call: Complaining of withdrawal symptoms reducing lorazepam from 0.5 mg 5 times a day to 0.5 mg 4 times daily.  05/21/2021 appointment with the following noted: Gotten onto buspar 30 BID and down on lorazepam to 0.5 mg QID. Gotten down to Seroquel 200 mg HS A little restless every now and then but able to reduce lorazepam for the last weeks. Is too inactive and lays in bed too much watching TV.   Can' t tell effect of buspirone bc of other changes.  Sleep with awakening with 6-7 hours. Fleeting SI without trigger or plan or intent. Depressed more than anxious. Enjoys watching stocks and investing. Plan: Trial taper lorazepam gradually over time In hopes of improving short-term memory But for now continue lorazepam 0.5 mg QID Start clonidine 0.1 mg tablets one half at night for 3 nights, then 1/2 tablet twice daily for 3 nights, then one half in the morning and 1 tablet at night If clonidine does not noticeably help anxiety call the  office  05/27/2021 phone call initially complaining of homicidal and suicidal thoughts after taking 1 mg of clonidine.  He was told to stop the medication.  But then called back stating he was having homicidal and suicidal thoughts from withdrawal from the medication which was not logical.  He agreed it was not logical and stated on 05/29/2021 that he felt better  06/23/2021 appointment the following noted: No clonidine. Sunday panic attack eating out and then having SI.  Stayed with father to feel safer. Intrusive thoughts of hurting parents or others.  HI only when has SI.  Usually panic without SI.  Then had intrusive thoughts about hurting others.   Usually intrusive thoughts  are brief and fleeting.    07/20/21  appt noted; More anxious with less lithium.  More anxious in his body.  Not more depressed or irritable.  Sleep is the same.  More racing thoughts.  Chronic SI probably a litte worse but not unmanageable. Rduced lithium last week to 300 mg daily. No recent change in lisinopril. Off the clonidine for a week or so. Doesn't drink much water.  Drinks a lot of diet coke. No marked akathisia or RLS but does shake his leg DT anxiety. Worries about needing mor emedicine. Plan: Increase olanzapine to 25 mg nightly bc more racing thoughts and anxiety with less lithium DT severity of sx and difficulty getting off Seroquel conside rincrease.  07/30/21 TC : CO racing SI today and wonders about whether increased sx racing thoughts, hyperactivity, SI related to Reduced lithium from 600 mg daily to 300 mg daily recently DT lithium level 1.8.  could be related.    Plan: increase olanzapine 30 mg pm Today take lithium 600 mg now.  Tomorrow start lithium CR 450 mg daily Continue olanzapine 30 mg PM until SI resolves unless akathisia returns.  Meredith Staggers, MD, DFAPA  08/17/21 appt noted:  No akathisia with olanzapine 30 mg pm (about 3 weeks) and is sleeping better. Increased lithium to 450 mg  daily. Tolerating meds. Still racing negative thoughts maybe some better.  Asked about it. SI come and go.   Anxiety about the same as depression.  No motivation unless has to do something. Plan: Reduce  fluoxetine 20 mg 1 daily in hopes racing thoughts and anxiety are better.  09/14/2021 appointment with the following noted: No difference noted with reduction fluoxetine 20 mg daily. Most of the time negative thoughts, ex "what if I get in a wreck?"  Frequent negative thoughts.   Still on lorazepam 0.5 mg TID.   Wonders about trying prior meds that caused akathisia bc no longer gets it with olanzapine 30 mg daily. Sleep is usually ok.  Taking Seroquel 200 mg HS.  Thinks the olanzapine helps his sleep and doesn't want to stop it. Panic 1-2 times per month.  Then takes extra lorazepam. Plan: DC fluoxetine 20 mg 1 daily  Trial Auvelity off label for depression 1 in the AM for 1 week then 1 in AM and 1 with evening meal  10/15/2021 appointment with the following noted: Occ of getting confused as to day and went to church on a Thursday my mistake.   Had it happen a few months ago too.  Memory is terrible. Thinks he couldn't tolerate more Auvelity. Not a lot of benefit from [place] 1 daily. Still quite a bit of anxiety.   A little drowsy  and sleeps 7-8 hours at night. Word finding problems.  Lost wallet. Plan: DC  Auvelity  For [facility]: memantine 1/2 tablet in the AM for 1 week, then 1/2 tablet twice daily, then 1/2 tablet in the AM and 1 tablet at night for 1 week then 1 tablet twice daily  10/22/2021 phone call complaining of additional stress asking to start new medication because of anxiety and more suicidal thoughts without intent or plan.  Reports taking more than 4 prescribed Ativan a day recently.  Asking to increase olanzapine which is already at 30 mg daily. MD response:We cannot go any higher in the dosage of olanzapine that he is currently taking.  A lot of these suicidal thoughts are  apparently anxiety driven.  Let us have him resume taking paroxetine  which is one of the more effective medicines for anxiety.  I will send it in and the instructions will be paroxetine 20 mg tablets 1/2 tablet daily for 1 week then 1 tablet daily  11/16/21 appt noted: Multiple phone calls since she was here.  Complaining of suicidal thoughts from memantine which was stopped. Started paroxetine and up to 20 mg daily.  Continues olanzapine 30 mg, lithium 450 mg nightly, lorazepam 0.5 mg 4 times daily , And quetiapine 200 mg nightly. Questions about whether memantine caused the neg SI really bc has them at times. When has SI often anxiety driven.  Will tend to ruminate on past relationship ended.   Edgy and irritable all the time but memantine seemed to make it worse. Mind is getting better with less SI lately.   No SE with paroxetine.   Aggrivated by forgetfulness.  eXample, talked with someone about going to breakfast last night and then forgot to go this mornin.g. Plan prescribed donepezil 5 mg daily for cognitive complaints specifically forgetfulness  12/31/2021 appointment with the following noted: He had been prescribed donepezil 5 mg for cognitive complaints.  He called back reporting he felt it was causing homicidal thoughts and was instructed to stop it.  He has had these types of thoughts in the past.  He had no desire to act on them. Discouraged. Ongoing depression and reduced enjoyment and negative thoughts.  Can enjoy some things. A lot of anxiety chronically.  Trying to limit Ativan to 5 of 0.5 mg daily.  Sometimes needs 2. Constipation for years.  02/03/22 appt noted: Depression makes LBP worse. Ongoing chronic depression. Tolerated rivastigmine without triggering SI Chronic anxiety and Ativan helps some. Occ panic but not usual.  Asks if any other meds coud be used. Plan: But for now continue lorazepam 0.5 mg QID, but use LED for cog reasons Increase rivastigmine to 3 mg capsule  1 twice daily or 2 of the 1.5 mg capsules twice daily.  Call if you have problems with nausea Start dextromethorphan 1 capsule twice daily . Thi sis off label for depression based on MOA of Auvelity and use of paroxetine to prolong half life of DM. Presence of paroxetine may have been reason he didn't tolerate Auvelity brief trial before.  Will proceed slowly.  03/17/2022 appointment noted: Current psych meds: Rivastigmine 3 mg twice daily, quetiapine 200 mg nightly, paroxetine 40 mg daily, olanzapine 30 mg nightly, Namenda 10 mg daily, lorazepam 0.5 mg 4 times daily, lithium CR 450 mg nightly, dextromethorphan 15 mg twice daily A lot of anxiety since here.  Some days taken more lorazepam to 6-7 tablets daily.  No major triggers but anxious even about coming here and other normal things.  Cancelled trip to collect rocks with friend and cancelled the trip bc anxious.  Usually if can go he is ok.  Lots of SI ongoing Memory might be a little better. Asks how he responded to [employer].  Will check paper chart.  03/24/22 TC:  CO SI worse and restilless with Rivastigmine 9 mg . MD response:  Stop the rivastigmine since the [place] appears to be having side effects from it.  He was having anxiety at the last appointment 2 and then we increased the dose.  This might be causing the anxiety to be worse. He has chronic suicidal thoughts without intent or plan.  However it appears the thoughts are more intense with the rivastigmine     05/06/21 appt noted: Stopped rivastigmine DT anxiety  and worsening SI.  Was better the next day. This week had some SI.  Thinking about the prior relationship tends to lead to SI. Will take Ativan when gets SI and it helps a little at 0.5 mg at a time. Plan: Consider fluvoxamine.  Yes this may help with obsessions on old GF and he failed other optioons. Start fluvoxamine 1/2 of 50 mg tablet and reduce paroxetine to 1 and 1/2 of 20 mg tablets for 5 days, Then increase fluvoxamine  to 1 tablet daily and reduce paroxetine to 1 tablet daily for 5 days, Then increase fluvoxamine to 1-1/2 tablets daily and reduce paroxetine to 1/2 tablet daily for 5 days, Then increase fluvoxamine to 2 tablets daily and stop paroxetine   06/07/22 appt noted: Feels like the fluvoxamine 50 BID is too much.  On this a couple of weeks. Worries he might be worse with this with regard to SI.    He thinks it's worse with increase.   New neighbor who has a young boy. He worries that Reginal Lutes is triggering depression and intrusive SI/HI a couple of days later and lasts a little while. Has had anxiety turning into panic attacks.  This gets associated with depression and SI/HI.  These thoughts are intrusive and he doesn't want to act on them.  Can have HI even towards family with whom he has no negative emotions.  Little things like a bump up in parking lot sets off his depression and anxiety and doesn't seem to handle things.   No sig akathisia.  In the past it's been continuous.   Awakens every 2-3 hours.  Usually goes back to sleep with meds. Has not had alcohol in 20 years and had some intrusive thoughts without drinking.   Plan: As soon as he feels comfortable increase fluvoxamine to 50 mg AM and 100 mg PM for intrusive thoughts of HI/SI and obs on GF  07/06/22 appt noted: Increased fluvoxamine to 150 mg daily and felt more agitated and some SI so reduced to 100 mg 4 days ago and feels better so far.  Likes the fact it is good for anxiety and obs thoughts.  Wonders if there is a similar med.  Frustrated he can't get relief.  Some improvement in obs on exGF but not resolved. Continue other meds: lithium CR 450 mg HS, lorazepam 0.5 mg QID, Memantine 10 BID, olanzapine 30 mg HS, quetiapine 200 mg HS Tingling and burning in legs at night but doesn't interfere with sleep. Got insurance to pay for [facility] and lost 13#.   No full panic lately. But some anxiety waves.   Still major problems with memory. Plan:  Wean fluvoxamine 50 mg daily for a month then 1/2 daily for 2 weeks then stop it DT  NR.  Watch for more anxiety.  07/28/22 TC: Franchot Erichsen, CMA  to Me     07/28/22  4:16 PM Note Mom called reporting patient has been confused, dizzy, and sluggish for the last 3 days. He is now staying with her. She had several questions in regards to this:   Lithium level - she feels he needs one done, last result in Epic was 08/2021 that I see. She would like order sent to [facility] for tomorrow.   Patient is on Wegovy, has been for about a month, and she wonders if this could cause sx.   Patient has meclizine and she is questioning if she should give that for his dizziness considering other sx.  She questions if perhaps he got up during the night and took additional medications, though doesn't know if this happened and if so what he took.    On 3/5 visit he was to wean fluvoxamine to 50 mg qd for a month and then 1/2 tab for 2 weeks and then stop. Mom and pt unsure if he started the wean.    He has F/U 4/4.        Me  to Franchot Erichsen, CMA   CC   07/28/22  5:44 PM Note Yes get lithium level in the AM and don't take any lithium tonight or in the morning before the blood test.     Reginal Lutes should not cause these sx except if he is losing wt quickly or is dehydrated it can cause lithium toxicity and include these sx.  He needs to drink lots of water.   He should go ahead and wean the fluvoxamine as instructed in the last appt.   If the sx get worse, go to the ER.    Me   CC   07/29/22  6:49 PM Note RTC   Sx not as severe as when he had to go to hospital in the past.  [name] got Lithium level today.   Speech is not good .  Confused at times, but better today.  A little tremor .  A little balance problems.  No falls.  No diarrhea Took lihtium 450 last night. Mo says he got up in the middle of the night and moved things around last night and he doesn't rmember.   No change in night time  meds.   Gradually reducing fluvoxamine.   No extra lorazepam.     Spoke with mother who verifies the same.   No med changes but push fluids.   Meredith Staggers, MD, DFAPA      08/05/22 appt : with parents Pt hosp 08/02/22 with acute kidney injury and somewhat elevated lithium level with some confusion.  Spoke with psych NP at hosp and lithium was decreased to 450 mg HS. No pending appt known with medical doctors about kidney function. Don't feel good and having HI and SI and feels he needs lithium for these issues.  Doesn't feel he should have been discharged.   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 No added meds at night but has been confused at night and getting up multiple times and doing things he doesn't remember.  This concerns parents.  Parents been up at night with him and he doesn't make sense.  Still groggy in the am per mo for an hour.   Plan: Plan: Get lithium level and kidney test ASAP Stop fluvoxamine Reduce lithium 300 mg capsule 1 at night Monday night: reduce to olanzapine to 20 mg at night (stop the 10 mg tablet) and start clozapine prescription. Monday 4/15 : increase clozapine to 100 mg at night and reduce quetiapine to 1/2 of 200 mg tablet and continue olanzapine 20 mg each evening.  08/01/22 & 08/11/22 ED for HI and SI  08/12/22 TC mother:  Mother, Patsy called. Per note made by Larinda Buttery. He was referred to [facility].Reporting that he did go up there last night. This morning they called her and told her to come pick him up. They told her he wasn't having homicidal or suicidal thoughts and it would be four to five days of him waiting to go to a facility in [place]. They recommended him for PHP to start 4/16.  She picked him up today. He is worried about these thoughts coming back up. Also, [facility] came out yesterday to assess him and offered counseling from [facility]. He wants to do counseling with them.  Any input on care he can receive?  He is so discouraged and wants to be safe.  (She notes  that the [facility] gave him- Zyprexa 10mg  at 1:07am and again at 1:25am per the med list- based on his previous med list) He was supposed to be off of Zyprexa10mg . Has been on  Clozapine since Monday. He did go ahead and take it before he left ( 2pills) so he hasn't missed a dose and did go get labwork done. Is wanting to continue the Clozapine, please check the labwork to be able to fill the 100mg  dose.    MD resp:  Continue all meds as RX except go ahead and increase clozapine to 100 mg tablets, 1 each night as soon as he can get the 100 mg tablets from pharmacy.  RX sent.  Trying to speed up his chance of response by getting it to a hgiher dose.  Clozapine is very effective for HI/SI.  If he gets worsening HI/SI in the interim it will not be caused by meds but just the natural waxing and waining of his sx.  Once clozapine dose gets high enough it should help but avg patient needs 300 mg so we have to just be patient.      08/20/22 appt:  Up to 100 mg clozapine HS SE constipation and using Colace, Miralax.  Been to [facility] twice  since here.   Ongoing continuous HI/SI.   All I want to do is go to sleep to escape.  Dep causing the thoughts.   Taking Ativan 0.5 mg BID-QID.  He thinks mother not giving him enough of it.  He thinks anxiety triggers HI/SI.  He fears HI and hopes he's strong enough to reisist.  Not angry or wanting to hurt anyone in reality but has the intrusive thoughts. Conc and memory still not good.   Sleep not much different.   Staying with parents currently. Dep gotten worse M worrying he's dependent on lorazepam.   Confusion at night is no longer happening.  Not wandering at night.  Plan: For severe constipation start Linzess 145 mg AM Get lithium level and kidney test ASAP Stopped fluvoxamine and confusion at night is gone  lithium 300 mg capsule 1 at night  olanzapine to 20 mg at night  increase clozapine to 150 mg at night for 3 nights then 200 mg HS   olanzapine 20 mg each  evening. Continue lorazepam 0.5 mg QID, needs this and informed mother  09/07/22 appt noted: Psych meds: clozapine 250 MG HS, lithium 300 mg HS, lorazepam 0.5 mg BID-QID, olanzapine 20 .  Off Seroquel, linzess 290 mg daily, Miralax, Colace. Sleep 7-8 hours without change from before clozapine.   Mood is still very depressed and still gets anxiety. Still problems with constipation.   Dep is so much it is causing intrusive HI/SI.   No more bizarre behaviors at night.   Plan: Plan: Get lithium level and kidney test ASAP  lithium 300 mg capsule 1 at night  olanzapine to 20 mg at night  increase clozapine to  300 mg HS as soon as tolerated and then 400 mg HS.  olanzapine 20 mg each evening. Continue lorazepam 0.5 mg QID, needs this and informed mother  10/07/22 appt noted: alone and with parents  Rough month.  Altered voice for a couple of weeks or so..  Mind not good.  Feel out of sorts.  Jittery.  Constipation.  No appetite.  On Wegovy.  Saw GI doc.   May need to reduce Wegovy bc poor appetite and constipation.  Lost 25 #. Current psych med: clozapine 400 mg HS about a week or 2, olanzapine 20 mg HS, lithium 300 mg HS, lorazepam 0.5 mg QID. Reduced intrusive thoughts.  2 nights since here night eating.   Sleep 6 hours.  Limited napping during the day.   Anhedonia.  Lower motivation.  Tremor worse. M says he sleeps pretty well but awakens  BP is under control. Plan Reduce lithium to 150 mg capsule 1 at night DT level higher Reduce  olanzapine to 10 mg at night  Continue clozapine 400 mg HS bc helping SI and HI and  Continue lorazepam 0.5 mg QID, needs this and informed mother  11/08/22 appt noted:  alone and with parents. Ongoing calls between visits Doesn't think clozapine helping him sleep as well as olanzapine for sleep or any other benefit. While at restaurant had intrusive HI/SI. Staying with parents seems to provoke intrusive HI.  Is grateful to them.  Sat woke up with them.   Sometimes goes away and sometimes not.  No know trigger and no intent. Saw PCP.  Off Wegovy.  Started metformin. No effect noted from clozapine.   M limits his lorazepam.  PCP referring him to neuro to FU abnormal head scan.  He wonders if his speech and memory problems are related.   Plan: Cont clozapine 400 mg HS Check clozapine level and then direct dosing.  11/16/22 TC from pt to disc level:  MD resp:  Clozapine level ok but may benefit with increase to 500 mg nightly.  Tell him to increase with current supply.     12/06/22 TC from pt:  complaining of worsening intrusive ego-dystonic HI/SI without intent, desire or plan.   MD resp ok to increase clozapine to 600 mg HS.  12/09/22 appt noted: Complaining of ongoing racing thoughts and worsening HI/SI without intent or desire or plan.  Racing thoughts chronic and other thoughts wax and wane.  Usually triggered if starts thinking of something negative Meds: clozapine 600 mg HS, lithium 150, lorazepam 0.5 mg QID, olanzapine 10 HS Sleep with EFA.   Will have trouble going to sleep sometimes with thoughts. Doesn't think clozapine makes him sleepy.  Not really drowsy daytime. No dx OSA and no known apnea.  SE a little dizzy.  Constipation managed with BM every 2-3 days. Better than before.  PCP RX metformin and it helped. Plan: Plan for intrusive HI/SI wean olanzapine and trial of risperidone.  Reduce olanzapine to 5 mg HS for 1 week then stop it.  Addressed his fears of not sleeping off olanzapine. Then if sleeping start risperidone.  01/11/23 appt noted:  also seen with parents Psych meds: clozapine 600, lorazepam 0.5 mg QID, No olanzpaine ,  lithium 150 mg daily, no risperidone yet. Lost 45# on Wegovy down to 150#.   Still having intrusive HI/SI as noted before. Sleep no worse.  Takes a couple of hours after clozapine to sleep.  EFA 2-3 times nocturia.   Still racing thoughts.  Ongoing px with conc, memory,  planning Scored 73 on 100 pt test  for cognition.   Some trouble with articulation and speech at times.   "If could get rid of racing and intrusive thoughts  I'd be 100% better." Neuro says no stroke history and low B12 and workup ongoing at [facility], Dr. Donia Pounds. Memory is still terrible.   Plan: Start risperidone 2 mg tablet, 1 at night for racing thoughts and intrusive homicidal and suicidal thoughts. Call in 3 weeks if not better and we will increase the dose  02/09/23 appt: seen also with parents , 60 min  Lost to 152#. Meds: as above, risperidone incr to 4.5 mg HS for intrusive HI/SI SE drooling off an on.  Tremor mainly worse with a little more risperidone HI/SI intrusive thoughts wax and wane.  Worse when lays down. Takes 2 hours to fall asleep.  Needs to get OOB when awakens.  6 hours sleep. No falls.   Would rather try to increase risperidone than other options.    04/12/23 appt virtual:  seen alone CC:  Multiple phone calls about ongoing intrusive, obsessive, ego dystonic HI/SI thoughts without plan or intent. Didn't come to the office bc of these thoughts.  I know I don't want to do it.  Can last four hours.  Sees drug advertisements that talk about SI and that will trigger obsessions about it. Meds: clozapine 600 mg HS,  lithium 150 mg HS, lorazepam 0.5 mg TID, risperidone increased to 8 mg and now back to 4mg  HS.   Neuro sched MRI tomorrow. Enough sleep 5-6 hours at night.  Not napping.   Sometimes gets confused with med but not ususally.   No recent mania.   Depressed and anxious.  Psych med hx extensive including ECT and  risperidone,  Zyprexa 30 Latuda 80 which caused akathisia,  Vraylar, Rexulti, aripiprazole 20 mg with akathisia,  Seroquel 1000 mg,  InVega, Geodon, Saphris with side effects, symbyax, Fanapt NR .  Caplyta SE and markedly worse. Clozapine started end of March lithium 1200 SE,  lamotrigine 300 mg, Depakote 2000 mg, Tegretol, Trileptal and several of these in combinations,  gabapentin,  N-acetylcysteine, Nuedexta,     Belsomra with no response,  Lunesta no response, trazodone 200 mg,  Xanax, clonazepam, lorazepam less sedation. Buspirone NR Clonidine SE with 2 trials  Viibryd 40 mg for 3 months with diarrhea,  protriptyline with side effects,  Trintellix 20 mg,  Parnate 50 mg with no response,   Emsam 12 mg for 2 months,   imipramine,  venlafaxine,   bupropion was side effects,  Lexapro 20 mg, sertraline, paroxetine, Deplin, fluoxetine 80,  fluvoxamine 150 agitated.  Confusion when with clozapine. Auvelity NR at one daily.  SE BID  methylphenidate 60 mg,  Vyvanse, Concerta, strattera, , modafinil,  Memantine worsening SI? Donepezil, complained of HI, Exelon 3 BID rivastigmine DT worsening SI  pramipexole,  amantadine ,  Patient prone to akathisia.  [facility]  HX ECT   Review of Systems:  Review of Systems  Constitutional:  Positive for appetite change and fatigue.  Cardiovascular:  Negative for chest pain and palpitations.  Gastrointestinal:  Positive for constipation.  Musculoskeletal:  Positive for back pain.  Neurological:  Positive for dizziness and tremors.       Fidgety  Psychiatric/Behavioral:  Positive for decreased concentration, dysphoric mood and suicidal ideas. Negative for agitation, behavioral problems, hallucinations, self-injury and sleep disturbance. The patient is nervous/anxious. The patient is not hyperactive.     Medications: I have reviewed the patient's current medications.  Current Outpatient Medications  Medication Sig Dispense Refill   acetaminophen (TYLENOL) 500 MG tablet Take 1,000 mg by mouth every 6 (six)  hours as needed for moderate pain.     atropine 1 % ophthalmic solution Place 2 drops under the tongue 3 (three) times daily as needed. 2 mL 1   bisacodyl (DULCOLAX) 10 MG suppository Place 1 suppository (10 mg total) rectally as needed for moderate constipation. 12 suppository 0    esomeprazole (NEXIUM) 40 MG capsule Take 40 mg by mouth daily.     fenofibrate (TRICOR) 145 MG tablet Take 145 mg by mouth daily.     fluticasone (FLONASE) 50 MCG/ACT nasal spray Place 1 spray into both nostrils daily as needed for allergies or rhinitis.     fluvoxaMINE (LUVOX) 100 MG tablet 1/2 tablet in the morning for 1 week, then 1 tablet each morning 30 tablet 0   hydrocortisone (ANUSOL-HC) 2.5 % rectal cream Place 1 application rectally 3 (three) times daily as needed for hemorrhoids.     levothyroxine (SYNTHROID, LEVOTHROID) 75 MCG tablet Take 75 mcg by mouth daily before breakfast.     lisinopril (ZESTRIL) 5 MG tablet Take 5 mg by mouth daily.     lithium carbonate 150 MG capsule TAKE 1 CAPSULE BY MOUTH AT BEDTIME 30 capsule 0   LORazepam (ATIVAN) 0.5 MG tablet TAKE ONE TABLET BY MOUTH FOUR TIMES DAILY 120 tablet 0   meclizine (ANTIVERT) 25 MG tablet Take 1 tablet (25 mg total) by mouth 3 (three) times daily as needed for dizziness. 30 tablet 1   polycarbophil (FIBERCON) 625 MG tablet Take 1,250 mg by mouth daily.     risperidone (RISPERDAL) 4 MG tablet TAKE 1 TABLET BY MOUTH TWICE DAILY (Patient taking differently: Take 4 mg by mouth daily.) 60 tablet 0   sildenafil (VIAGRA) 100 MG tablet Take 1 tablet (100 mg total) by mouth daily as needed for erectile dysfunction. 30 tablet 6   tamsulosin (FLOMAX) 0.4 MG CAPS capsule Take 1 capsule (0.4 mg total) by mouth daily. 90 capsule 3   WEGOVY 1 MG/0.5ML SOAJ Inject 1 mg into the skin once a week.     cloZAPine (CLOZARIL) 100 MG tablet Take 5 tablets (500 mg total) by mouth at bedtime. 75 tablet 2   risperiDONE (RISPERDAL) 3 MG tablet Take 2 tablets (6 mg total) by mouth at bedtime. (Patient not taking: Reported on 04/12/2023) 8 tablet 0   No current facility-administered medications for this visit.   Medication Side Effects: Other: mild sleepiness.   Occ twitches.\, tremor  Allergies:  Allergies  Allergen Reactions   Meloxicam Other (See  Comments)    Dizziness   Nsaids Other (See Comments)    Hallucinations   Ibuprofen Other (See Comments)    Can not take because taking lithium   Prednisone Other (See Comments)    Can't sleep    Wellbutrin [Bupropion] Other (See Comments)    Suicidal thoughts    Past Medical History:  Diagnosis Date   ADHD (attention deficit hyperactivity disorder)    Anemia    Anxiety    Bipolar disorder (HCC)    BPH (benign prostatic hyperplasia)    Chronic kidney disease, stage 3b (HCC)    Constipation    DDD (degenerative disc disease), cervical    Depression    Deviated septum    ED (erectile dysfunction)    GERD (gastroesophageal reflux disease)    Graves disease    History of hiatal hernia    Hypertension    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    Hypothyroidism    Pneumonia  PONV (postoperative nausea and vomiting)     Family History  Problem Relation Age of Onset   Prostate cancer Neg Hx    Bladder Cancer Neg Hx    Kidney cancer Neg Hx     Social History   Socioeconomic History   Marital status: Single    Spouse name: Not on file   Number of children: Not on file   Years of education: Not on file   Highest education level: Not on file  Occupational History   Not on file  Tobacco Use   Smoking status: Former    Types: Cigarettes    Passive exposure: Past   Smokeless tobacco: Former    Types: Engineer, drilling   Vaping status: Never Used  Substance and Sexual Activity   Alcohol use: Not Currently   Drug use: Never   Sexual activity: Yes  Other Topics Concern   Not on file  Social History Narrative   Not on file   Social Determinants of Health   Financial Resource Strain: Low Risk  (11/03/2022)   Received from [facility], [facility]   Overall Financial Resource Strain (CARDIA)    Difficulty of Paying Living Expenses: Not very hard  Food Insecurity: No Food Insecurity (11/03/2022)   Received from  [facility], [facility]   Hunger Vital Sign    Worried About Running Out of Food in the Last Year: Never true    Ran Out of Food in the Last Year: Never true  Transportation Needs: No Transportation Needs (11/03/2022)   Received from [facility], [facility]   [facility] - Transportation    In the past 12 months, has lack of transportation kept you from medical appointments or from getting medications?: No    Lack of Transportation (Non-Medical): No  Physical Activity: Not on file  Stress: Stress Concern Present (08/12/2020)   Received from [facility], [facility] of [facility] - Occupational Stress Questionnaire    Feeling of Stress : Very much  Social Connections: Unknown (09/01/2021)   Received from [facility], [facility]   Social Network    Social Network: Not on file  Intimate Partner Violence: Unknown (08/06/2021)   Received from [facility], [facility]   HITS    Physically Hurt: Not on file    Insult or Talk Down To: Not on file    Threaten Physical Harm: Not on file    Scream or Curse: Not on file    Past Medical History, Surgical history, Social history, and Family history were reviewed and updated as appropriate.   Please see review of systems for further details on the patient's review from today.   Objective:   Physical Exam:  There were no vitals taken for this visit.  Physical Exam Constitutional:      General: He is not in acute distress.    Appearance: He is well-developed.  Musculoskeletal:        General: No deformity.  Neurological:     Mental Status: He is alert and oriented to person, place, and time.     Cranial Nerves: No dysarthria.     Motor: Tremor present.     Coordination: Coordination normal.     Comments: mild tremor  Psychiatric:        Attention and Perception: Attention and perception normal. He does not perceive auditory or  visual hallucinations.  Mood and Affect: Mood is anxious and depressed. Affect is not labile, blunt, angry or tearful.        Speech: Speech normal. Speech is not slurred.        Behavior: Behavior normal. Behavior is not slowed. Behavior is cooperative.        Thought Content: Thought content is not delusional. Thought content includes homicidal and suicidal ideation. Thought content does not include suicidal plan.        Cognition and Memory: Cognition normal. He exhibits impaired recent memory.        Judgment: Judgment normal.     Comments: Insight fair Chronically talkative .directable. But chronically mildly pressured. He is chronically anxious   No manic signs noted. Some wordfinding problems ongoing. Intrusive obsessions episodic HI/SI are the CC Alert and oriented.      Lab Review:     Component Value Date/Time   NA 135 10/20/2022 1123   NA 142 03/20/2014 0514   K 5.2 10/20/2022 1123   K 4.1 03/20/2014 0514   CL 101 10/20/2022 1123   CL 112 (H) 03/20/2014 0514   CO2 20 10/20/2022 1123   CO2 26 03/20/2014 0514   GLUCOSE 150 (H) 10/20/2022 1123   GLUCOSE 101 (H) 08/12/2022 0029   GLUCOSE 99 03/20/2014 0514   BUN 22 10/20/2022 1123   BUN 7 03/20/2014 0514   CREATININE 1.68 (H) 10/20/2022 1123   CREATININE 0.78 03/20/2014 0514   CALCIUM 9.8 10/20/2022 1123   CALCIUM 9.5 03/20/2014 0514   PROT 6.7 08/12/2022 0029   PROT 7.3 03/18/2014 1459   ALBUMIN 4.2 08/12/2022 0029   ALBUMIN 3.2 (L) 03/18/2014 1459   AST 18 08/12/2022 0029   AST 16 03/18/2014 1459   ALT 14 08/12/2022 0029   ALT 16 03/18/2014 1459   ALKPHOS 24 (L) 08/12/2022 0029   ALKPHOS 105 03/18/2014 1459   BILITOT 0.5 08/12/2022 0029   BILITOT 0.3 03/18/2014 1459   GFRNONAA 42 (L) 08/12/2022 0029   GFRNONAA >60 03/20/2014 0514   GFRAA 54 (L) 04/18/2019 1612   GFRAA >60 03/20/2014 0514       Component Value Date/Time   WBC 5.2 03/23/2023 1050   WBC 6.8 08/12/2022 0029   RBC 4.17 03/23/2023  1050   RBC 3.72 (L) 08/12/2022 0029   HGB 12.8 (L) 03/23/2023 1050   HCT 38.6 03/23/2023 1050   PLT 404 03/23/2023 1050   MCV 93 03/23/2023 1050   MCV 89 03/20/2014 0514   MCH 30.7 03/23/2023 1050   MCH 31.5 08/12/2022 0029   MCHC 33.2 03/23/2023 1050   MCHC 33.4 08/12/2022 0029   RDW 12.6 03/23/2023 1050   RDW 12.5 03/20/2014 0514   LYMPHSABS 2.2 03/23/2023 1050   LYMPHSABS 3.2 03/20/2014 0514   MONOABS 0.5 08/12/2022 0029   MONOABS 1.0 03/20/2014 0514   EOSABS 0.2 03/23/2023 1050   EOSABS 0.4 03/20/2014 0514   BASOSABS 0.1 03/23/2023 1050   BASOSABS 0.1 03/20/2014 0514    Lithium Lvl  Date Value Ref Range Status  10/20/2022 0.4 (L) 0.5 - 1.2 mmol/L Final    Comment:    A concentration of 0.5-0.8 mmol/L is advised for long-term use; concentrations of up to 1.2 mmol/L may be necessary during acute treatment.                                  Detection Limit = 0.1                           <  0.1 indicates None Detected    08/12/21 lithium level 0.9 on 450 mg.  lithium level Sept 0.8.   lithium level July 27, 2018 was normal at 1.0.   Lithium level [facility] October 03, 2018 = 1.2. Said he got lithium level at [facility] as requested.  Labs not in Epic.  Recent lipids ok except higher TG than usual.  Normal A1C.  11/10/22 Checked clozapine level on 400 mg HS= cloz 733+norclozapine 294 = total 1027  .res Assessment: Plan:    [place] was seen today for follow-up, depression, anxiety and other.  Diagnoses and all orders for this visit:  Severe bipolar I disorder, current or most recent episode depressed (HCC) -     cloZAPine (CLOZARIL) 100 MG tablet; Take 5 tablets (500 mg total) by mouth at bedtime.  GAD (generalized anxiety disorder)  Panic disorder with agoraphobia  Insomnia due to mental condition  Mild cognitive impairment  Other obsessive-compulsive disorders -     fluvoxaMINE (LUVOX) 100 MG tablet; 1/2 tablet in the morning for 1 week, then 1 tablet each  morning    75 min appt with pt  needs a lot of time and gets parents involved in session   Chronic TR bipolar mixed and chronic anxiety.  He usually has mixed bipolar symptoms which we have not been able to completely eliminate.  See long list of meds tried.   He is  chronically unstable .  Mood is relatively stable except for the severe intrusive, ego-dystonic persistent obsessive HI/SI without intent or plan. Chronic frequent ph calls between appts often about the same issue repeatedly most recently re: intrusive ego dystonic thoughts HI/SI.  More like obsessions than true HI/SI.  Ongoing Multiple phone calls about ongoing intrusive, obsessive, ego dystonic HI/SI thoughts without plan or intent. Maxed out risperidone 8 mg without benefit. No recent mania.  Chronic dep and anxiety.  Psych hosp 08/02/22 with SI and acute kidney injury and elevated lithium. Processed this and disc goal of trying to stop lithium but risk of worsening SI.  He has chronic SI and there's risk of this worsening off lithium.  Lithium and clozapine are the most effective meds for SI.    We discussed the short-term risks associated with benzodiazepines including sedation and increased fall risk among others.  Discussed long-term side effect risk including dependence, potential withdrawal symptoms, and the potential eventual dose-related risk of dementia.  But recent studies from 2020 dispute this association between benzodiazepines and dementia risk. Newer studies in 2020 do not support an association with dementia.  Unfortunately due to the severity of set his symptoms polypharmacy is a necessity.    Lithium being used bc chronic SI and death thoughts.  Counseled patient regarding potential benefits, risks, and side effects of lithium to include potential risk of lithium affecting thyroid and renal function.  Discussed need for periodic lab monitoring to determine drug level and to assess for potential adverse effects.   Counseled patient regarding signs and symptoms of lithium toxicity and advised that they notify office immediately or seek urgent medical attention if experiencing these signs and symptoms.  Patient advised to contact office with any questions or concerns.  Reduced lithium to 150 mg daily Dt hx toxicity Checked lithium level and BMP and level 1.1 on 300 mg Daily so reduced again to 150 mg daily and level 0.4 Call if death thoughts worsen or worsening SI.  Disc clozapine and it's level..  Disc data showing less SI with it but  he's had no changes.    Disc risk in detail including low WBC with complication, myocarditis.  Extensive discussion of CBC monitoring.  Disc going higher despite level and checking ECG bc cardiac risk at higher doses.   Disc this again bc taking Wegovy might help him to tolerate it better.   clozapine 600 mg HS was Started 12/07/22. Started clozapine 08/05/22. 11/10/22 Checked clozapine level on 400 mg HS= cloz 733+norclozapine 294 = total 1027 CBC every 2 weeks  Did he take duloxetine?  Might help depression more.  Consider retry pramipexole off label   But for now continue lorazepam 0.5 mg QID prn and lately BID-TID prn,  but use LED for cog reasons M administers  For [facility] had to stop rivastigmine DT worsening SI. Neuro [facility] Dr. Donia Pounds, [facility] in progress.    We discussed the short-term risks associated with benzodiazepines including sedation and increased fall risk among others.  Discussed long-term side effect risk including dependence, potential withdrawal symptoms, and the potential eventual dose-related risk of dementia.  But recent studies from 2020 dispute this association between benzodiazepines and dementia risk. Newer studies in 2020 do not support an association with dementia.  Discussed safety plan at length with patient.  Advised patient to contact office with any worsening signs and symptoms.  Instructed patient to go to the [facility] emergency room for evaluation  if experiencing any acute safety concerns, to include suicidal intent.  He commits to safety.  Disc Ozempic and Monjauro for weight loss.  Has had weight gain from meds as a contributor. Lost 45# with Wegovy and now 150#.  Needs to keep up activity as much as possible for depression.  For severe constipation seeing GI Will not make further changes to address this. Had constipation before clozapine and GI appt in June started Amitza which helped some. Needs to push fluids.    Historically he has not done well with serotonergic medicines which would typically be used for obsessive thoughts.  But we are running out of options.  Has not tried with clozapine. BC blood level of clozapine reluctant to increase unless we have to so ...  AVS: Reduce risperidone to 1/2 of 4 mg tablet in evening for 1 week then stop it. Reduce clozapine to 5 tablets nightly Start fluvoxamine 100 mg tablet 1/2 tablet nightly for 1 week then 1 tablet nightly for obsessive, intrusive thoughts Explained plan.  Disc SE and will take weeks.  Will use low dose bc some SE when trie dlast time.  Will reduce clozapine to reduce SE risk.  Needs 4 week trial before increasing.   Consider ECT for TR.  He's open to it but no decision today.  Disc memory concerns and mother's memory concerns bc had it before.  Disc counseling.  [email or web address] for therapist.  Most effective therapist will need to be expert in CBT  Dr. Quintella Reichert is not available for consultation.   Disc going to [facility] or [facility]  FU 4 weeks   Meredith Staggers, MD, DFAPA  Future Appointments  Date Time Provider Department Center  05/25/2023  1:30 PM Cottle, Steva Ready., MD [facility] None  06/22/2023  1:00 PM Cottle, Steva Ready., MD [facility] None  12/29/2023  1:30 PM Sondra Come, MD [facility] None    No orders of the defined types were placed in this encounter.      -------------------------------

## 2023-04-13 ENCOUNTER — Encounter: Payer: Self-pay | Admitting: Psychiatry

## 2023-04-14 ENCOUNTER — Emergency Department: Payer: Medicare PPO

## 2023-04-14 ENCOUNTER — Other Ambulatory Visit: Payer: Self-pay | Admitting: Psychiatry

## 2023-04-14 ENCOUNTER — Inpatient Hospital Stay
Admission: EM | Admit: 2023-04-14 | Discharge: 2023-04-17 | DRG: 100 | Disposition: A | Discharge status: Other Acute Inpatient Hospital | Payer: Medicare PPO | Attending: Internal Medicine | Admitting: Internal Medicine

## 2023-04-14 ENCOUNTER — Other Ambulatory Visit: Payer: Self-pay

## 2023-04-14 ENCOUNTER — Ambulatory Visit: Payer: Medicare PPO

## 2023-04-14 ENCOUNTER — Encounter: Payer: Self-pay | Admitting: Emergency Medicine

## 2023-04-14 ENCOUNTER — Inpatient Hospital Stay: Payer: Medicare PPO

## 2023-04-14 ENCOUNTER — Telehealth: Payer: Self-pay | Admitting: Psychiatry

## 2023-04-14 DIAGNOSIS — E663 Overweight: Secondary | ICD-10-CM | POA: Diagnosis present

## 2023-04-14 DIAGNOSIS — F411 Generalized anxiety disorder: Secondary | ICD-10-CM | POA: Diagnosis present

## 2023-04-14 DIAGNOSIS — N529 Male erectile dysfunction, unspecified: Secondary | ICD-10-CM | POA: Diagnosis present

## 2023-04-14 DIAGNOSIS — R4182 Altered mental status, unspecified: Secondary | ICD-10-CM

## 2023-04-14 DIAGNOSIS — K219 Gastro-esophageal reflux disease without esophagitis: Secondary | ICD-10-CM | POA: Diagnosis present

## 2023-04-14 DIAGNOSIS — R45851 Suicidal ideations: Secondary | ICD-10-CM | POA: Diagnosis present

## 2023-04-14 DIAGNOSIS — N1831 Chronic kidney disease, stage 3a: Secondary | ICD-10-CM | POA: Diagnosis present

## 2023-04-14 DIAGNOSIS — G4089 Other seizures: Principal | ICD-10-CM | POA: Diagnosis present

## 2023-04-14 DIAGNOSIS — R55 Syncope and collapse: Secondary | ICD-10-CM | POA: Diagnosis present

## 2023-04-14 DIAGNOSIS — I129 Hypertensive chronic kidney disease with stage 1 through stage 4 chronic kidney disease, or unspecified chronic kidney disease: Secondary | ICD-10-CM | POA: Diagnosis present

## 2023-04-14 DIAGNOSIS — Z6822 Body mass index (BMI) 22.0-22.9, adult: Secondary | ICD-10-CM | POA: Diagnosis not present

## 2023-04-14 DIAGNOSIS — E872 Acidosis, unspecified: Secondary | ICD-10-CM | POA: Diagnosis present

## 2023-04-14 DIAGNOSIS — E039 Hypothyroidism, unspecified: Secondary | ICD-10-CM | POA: Diagnosis present

## 2023-04-14 DIAGNOSIS — E538 Deficiency of other specified B group vitamins: Secondary | ICD-10-CM | POA: Diagnosis present

## 2023-04-14 DIAGNOSIS — N400 Enlarged prostate without lower urinary tract symptoms: Secondary | ICD-10-CM | POA: Diagnosis present

## 2023-04-14 DIAGNOSIS — K5900 Constipation, unspecified: Secondary | ICD-10-CM | POA: Diagnosis present

## 2023-04-14 DIAGNOSIS — N2581 Secondary hyperparathyroidism of renal origin: Secondary | ICD-10-CM | POA: Diagnosis present

## 2023-04-14 DIAGNOSIS — E781 Pure hyperglyceridemia: Secondary | ICD-10-CM | POA: Diagnosis present

## 2023-04-14 DIAGNOSIS — Z7985 Long-term (current) use of injectable non-insulin antidiabetic drugs: Secondary | ICD-10-CM

## 2023-04-14 DIAGNOSIS — R569 Unspecified convulsions: Secondary | ICD-10-CM | POA: Diagnosis present

## 2023-04-14 DIAGNOSIS — F319 Bipolar disorder, unspecified: Secondary | ICD-10-CM | POA: Insufficient documentation

## 2023-04-14 DIAGNOSIS — Z8673 Personal history of transient ischemic attack (TIA), and cerebral infarction without residual deficits: Secondary | ICD-10-CM

## 2023-04-14 DIAGNOSIS — F314 Bipolar disorder, current episode depressed, severe, without psychotic features: Secondary | ICD-10-CM | POA: Diagnosis present

## 2023-04-14 DIAGNOSIS — Z79899 Other long term (current) drug therapy: Secondary | ICD-10-CM

## 2023-04-14 DIAGNOSIS — K631 Perforation of intestine (nontraumatic): Secondary | ICD-10-CM | POA: Diagnosis present

## 2023-04-14 DIAGNOSIS — Z79890 Hormone replacement therapy (postmenopausal): Secondary | ICD-10-CM

## 2023-04-14 DIAGNOSIS — F429 Obsessive-compulsive disorder, unspecified: Secondary | ICD-10-CM | POA: Diagnosis present

## 2023-04-14 DIAGNOSIS — N183 Chronic kidney disease, stage 3 unspecified: Secondary | ICD-10-CM | POA: Diagnosis present

## 2023-04-14 DIAGNOSIS — Z7722 Contact with and (suspected) exposure to environmental tobacco smoke (acute) (chronic): Secondary | ICD-10-CM | POA: Diagnosis present

## 2023-04-14 DIAGNOSIS — Z886 Allergy status to analgesic agent status: Secondary | ICD-10-CM

## 2023-04-14 DIAGNOSIS — F909 Attention-deficit hyperactivity disorder, unspecified type: Secondary | ICD-10-CM | POA: Diagnosis present

## 2023-04-14 DIAGNOSIS — Z888 Allergy status to other drugs, medicaments and biological substances status: Secondary | ICD-10-CM

## 2023-04-14 LAB — COMPREHENSIVE METABOLIC PANEL
ALT: 10 U/L (ref 0–44)
AST: 21 U/L (ref 15–41)
Albumin: 3.8 g/dL (ref 3.5–5.0)
Alkaline Phosphatase: 39 U/L (ref 38–126)
Anion gap: 11 (ref 5–15)
BUN: 17 mg/dL (ref 6–20)
CO2: 20 mmol/L — ABNORMAL LOW (ref 22–32)
Calcium: 9.2 mg/dL (ref 8.9–10.3)
Chloride: 107 mmol/L (ref 98–111)
Creatinine, Ser: 1.5 mg/dL — ABNORMAL HIGH (ref 0.61–1.24)
GFR, Estimated: 53 mL/min — ABNORMAL LOW (ref >60)
Glucose, Bld: 229 mg/dL — ABNORMAL HIGH (ref 70–99)
Potassium: 3.6 mmol/L (ref 3.5–5.1)
Sodium: 138 mmol/L (ref 135–145)
Total Bilirubin: 0.3 mg/dL (ref <1.2)
Total Protein: 6.5 g/dL (ref 6.5–8.1)

## 2023-04-14 LAB — URINALYSIS, W/ REFLEX TO CULTURE (INFECTION SUSPECTED)
Bacteria, UA: NONE SEEN
Bilirubin Urine: NEGATIVE
Glucose, UA: NEGATIVE mg/dL
Hgb urine dipstick: NEGATIVE
Ketones, ur: NEGATIVE mg/dL
Leukocytes,Ua: NEGATIVE
Nitrite: NEGATIVE
Protein, ur: NEGATIVE mg/dL
Specific Gravity, Urine: 1.009 (ref 1.005–1.030)
Squamous Epithelial / HPF: 0 /[HPF] (ref 0–5)
pH: 7 (ref 5.0–8.0)

## 2023-04-14 LAB — CBC WITH DIFFERENTIAL/PLATELET
Abs Immature Granulocytes: 0.04 10*3/uL (ref 0.00–0.07)
Basophils Absolute: 0 10*3/uL (ref 0.0–0.1)
Basophils Relative: 1 %
Eosinophils Absolute: 0.2 10*3/uL (ref 0.0–0.5)
Eosinophils Relative: 3 %
HCT: 36.9 % — ABNORMAL LOW (ref 39.0–52.0)
Hemoglobin: 12 g/dL — ABNORMAL LOW (ref 13.0–17.0)
Immature Granulocytes: 1 %
Lymphocytes Relative: 31 %
Lymphs Abs: 1.9 10*3/uL (ref 0.7–4.0)
MCH: 30.8 pg (ref 26.0–34.0)
MCHC: 32.5 g/dL (ref 30.0–36.0)
MCV: 94.6 fL (ref 80.0–100.0)
Monocytes Absolute: 0.3 10*3/uL (ref 0.1–1.0)
Monocytes Relative: 4 %
Neutro Abs: 3.8 10*3/uL (ref 1.7–7.7)
Neutrophils Relative %: 60 %
Platelets: 368 10*3/uL (ref 150–400)
RBC: 3.9 MIL/uL — ABNORMAL LOW (ref 4.22–5.81)
RDW: 13.9 % (ref 11.5–15.5)
WBC: 6.2 10*3/uL (ref 4.0–10.5)
nRBC: 0 % (ref 0.0–0.2)

## 2023-04-14 LAB — URINE DRUG SCREEN, QUALITATIVE (ARMC ONLY)
Amphetamines, Ur Screen: NOT DETECTED
Barbiturates, Ur Screen: NOT DETECTED
Benzodiazepine, Ur Scrn: NOT DETECTED
Cannabinoid 50 Ng, Ur ~~LOC~~: NOT DETECTED
Cocaine Metabolite,Ur ~~LOC~~: NOT DETECTED
MDMA (Ecstasy)Ur Screen: POSITIVE — AB
Methadone Scn, Ur: NOT DETECTED
Opiate, Ur Screen: NOT DETECTED
Phencyclidine (PCP) Ur S: NOT DETECTED
Tricyclic, Ur Screen: POSITIVE — AB

## 2023-04-14 LAB — LITHIUM LEVEL: Lithium Lvl: 0.2 mmol/L — ABNORMAL LOW (ref 0.60–1.20)

## 2023-04-14 LAB — LACTIC ACID, PLASMA
Lactic Acid, Venous: 5.1 mmol/L (ref 0.5–1.9)
Lactic Acid, Venous: 1.9 mmol/L (ref 0.5–1.9)

## 2023-04-14 LAB — MAGNESIUM: Magnesium: 2.3 mg/dL (ref 1.7–2.4)

## 2023-04-14 LAB — TROPONIN I (HIGH SENSITIVITY)
Troponin I (High Sensitivity): 3 ng/L (ref <18)
Troponin I (High Sensitivity): 3 ng/L (ref <18)

## 2023-04-14 LAB — ETHANOL: Alcohol, Ethyl (B): <10 mg/dL (ref <10)

## 2023-04-14 MED ORDER — ACETAMINOPHEN 650 MG RE SUPP: 650.0000 mg | Freq: Four times a day (QID) | RECTAL | Status: DC | PRN

## 2023-04-14 MED ORDER — SODIUM CHLORIDE 0.9 % IV SOLN
2000.0000 mg | Freq: Once | INTRAVENOUS | Status: AC
  Administered 2023-04-14: 2000 mg via INTRAVENOUS
  Filled 2023-04-14: qty 20

## 2023-04-14 MED ORDER — ONDANSETRON HCL 4 MG/2ML IJ SOLN: 4.0000 mg | Freq: Four times a day (QID) | INTRAMUSCULAR | Status: DC | PRN

## 2023-04-14 MED ORDER — LORAZEPAM 2 MG/ML IJ SOLN: 2.0000 mg | INTRAMUSCULAR | Status: DC | PRN

## 2023-04-14 MED ORDER — LEVETIRACETAM IN NACL 500 MG/100ML IV SOLN
500.0000 mg | Freq: Two times a day (BID) | INTRAVENOUS | Status: DC
  Administered 2023-04-14 – 2023-04-15 (×3): 500 mg via INTRAVENOUS
  Filled 2023-04-14 (×4): qty 100

## 2023-04-14 MED ORDER — LORAZEPAM 2 MG/ML IJ SOLN
0.5000 mg | Freq: Four times a day (QID) | INTRAMUSCULAR | Status: DC
  Administered 2023-04-14 – 2023-04-17 (×13): 0.5 mg via INTRAVENOUS
  Filled 2023-04-14 (×13): qty 1

## 2023-04-14 MED ORDER — POLYETHYLENE GLYCOL 3350 17 G PO PACK
17.0000 g | PACK | Freq: Two times a day (BID) | ORAL | Status: DC
  Administered 2023-04-15 – 2023-04-17 (×5): 17 g via ORAL
  Filled 2023-04-14 (×5): qty 1

## 2023-04-14 MED ORDER — ONDANSETRON HCL 4 MG PO TABS: 4.0000 mg | ORAL_TABLET | Freq: Four times a day (QID) | ORAL | Status: DC | PRN

## 2023-04-14 MED ORDER — LORAZEPAM 2 MG/ML IJ SOLN
INTRAMUSCULAR | Status: AC
  Filled 2023-04-14: qty 1

## 2023-04-14 MED ORDER — SODIUM CHLORIDE 0.9% FLUSH
3.0000 mL | Freq: Two times a day (BID) | INTRAVENOUS | Status: DC
  Administered 2023-04-14 – 2023-04-17 (×6): 3 mL via INTRAVENOUS

## 2023-04-14 MED ORDER — HEPARIN SODIUM (PORCINE) 5000 UNIT/ML IJ SOLN
5000.0000 [IU] | Freq: Three times a day (TID) | INTRAMUSCULAR | Status: DC
  Administered 2023-04-14 – 2023-04-17 (×9): 5000 [IU] via SUBCUTANEOUS
  Filled 2023-04-14 (×8): qty 1

## 2023-04-14 MED ORDER — LEVOTHYROXINE SODIUM 75 MCG PO TABS
75.0000 ug | ORAL_TABLET | Freq: Every day | ORAL | Status: DC
Start: 2023-04-15 — End: 2023-04-14

## 2023-04-14 MED ORDER — LORAZEPAM 2 MG/ML IJ SOLN
2.0000 mg | INTRAMUSCULAR | Status: DC | PRN
  Administered 2023-04-14: 2 mg via INTRAVENOUS
  Filled 2023-04-14: qty 1

## 2023-04-14 MED ORDER — TAMSULOSIN HCL 0.4 MG PO CAPS
0.4000 mg | ORAL_CAPSULE | Freq: Every day | ORAL | Status: DC
  Administered 2023-04-15 – 2023-04-17 (×3): 0.4 mg via ORAL
  Filled 2023-04-14 (×3): qty 1

## 2023-04-14 MED ORDER — HYDRALAZINE HCL 20 MG/ML IJ SOLN: 5.0000 mg | Freq: Four times a day (QID) | INTRAMUSCULAR | Status: DC | PRN

## 2023-04-14 MED ORDER — ACETAMINOPHEN 325 MG PO TABS: 650.0000 mg | ORAL_TABLET | Freq: Four times a day (QID) | ORAL | Status: DC | PRN

## 2023-04-14 MED ORDER — SENNOSIDES-DOCUSATE SODIUM 8.6-50 MG PO TABS: 1.0000 | ORAL_TABLET | Freq: Every evening | ORAL | Status: DC | PRN

## 2023-04-14 MED ORDER — LEVOTHYROXINE SODIUM 50 MCG PO TABS
75.0000 ug | ORAL_TABLET | Freq: Every day | ORAL | Status: DC
  Administered 2023-04-16 – 2023-04-17 (×2): 75 ug via ORAL
  Filled 2023-04-14 (×2): qty 1

## 2023-04-14 NOTE — ED Provider Notes (Signed)
[facility] Provider Note   Event Date/Time   First MD Initiated Contact with Patient 04/14/23 1015     (approximate) History  Loss of Consciousness and Altered Mental Status  HPI Albert [name] is a 57 y.o. male with stated past medical history of severe bipolar disorder who presents via EMS after an episode of loss of consciousness prior to arrival.  Family states the patient had an episode of shaking-like activity prior to EMS arrival.  Upon EMS arrival patient was not responding to verbal stimuli and would only awaken to vigorous sternal rub.  Patient showed only minimal signs of pain during IV start during transport.  Patient has no reported history of seizures in the past.  No medications were given en route.  Further history and review of systems are unable to be obtained at this time secondary to patient's mental status   Physical Exam  Triage Vital Signs: ED Triage Vitals  Encounter Vitals Group     BP      Systolic BP Percentile      Diastolic BP Percentile      Pulse      Resp      Temp      Temp src      SpO2      Weight      Height      Head Circumference      Peak Flow      Pain Score      Pain Loc      Pain Education      Exclude from Growth Chart    Most recent vital signs: Vitals:   04/14/23 1230 04/14/23 1300  BP: (!) 125/92 (!) 112/93  Pulse: 89 88  Resp: 19 19  Temp:    SpO2: 94% 94%   General: Awake, GCS 10 CV:  Good peripheral perfusion.  Resp:  Normal effort.  Abd:  No distention.  Other:  Middle-aged overweight Caucasian male resting comfortably in no acute distress.  Ptosis of the right eyelid.  Pupils equal and reactive bilaterally ED Results / Procedures / Treatments  Labs (all labs ordered are listed, but only abnormal results are displayed) Labs Reviewed  COMPREHENSIVE METABOLIC PANEL - Abnormal; Notable for the following components:      Result Value   CO2 20 (*)    Glucose, Bld 229 (*)    Creatinine, Ser 1.50  (*)    GFR, Estimated 53 (*)    All other components within normal limits  LACTIC ACID, PLASMA - Abnormal; Notable for the following components:   Lactic Acid, Venous 5.1 (*)    All other components within normal limits  CBC WITH DIFFERENTIAL/PLATELET - Abnormal; Notable for the following components:   RBC 3.90 (*)    Hemoglobin 12.0 (*)    HCT 36.9 (*)    All other components within normal limits  LITHIUM LEVEL - Abnormal; Notable for the following components:   Lithium Lvl 0.20 (*)    All other components within normal limits  LACTIC ACID, PLASMA  URINE DRUG SCREEN, QUALITATIVE ([facility] ONLY)  URINALYSIS, W/ REFLEX TO CULTURE (INFECTION SUSPECTED)  CLOZAPINE (CLOZARIL)  PROCALCITONIN  TROPONIN I (HIGH SENSITIVITY)  TROPONIN I (HIGH SENSITIVITY)   EKG ED ECG REPORT I, Merwyn Katos, the attending physician, personally viewed and interpreted this ECG. Date: 04/14/2023 EKG Time: 1019 Rate: 110 Rhythm: Tachycardic sinus rhythm QRS Axis: normal Intervals: normal ST/T Wave abnormalities: normal Narrative Interpretation: Tachycardic sinus rhythm.  It is no evidence of acute ischemia RADIOLOGY ED MD interpretation: CT of the head without contrast interpreted by me shows no evidence of acute abnormalities including no intracerebral hemorrhage, obvious masses, or significant edema -Agree with radiology assessment Official radiology report(s): CT Head Wo Contrast Result Date: 04/14/2023 CLINICAL DATA:  Seizure, new onset, no history of trauma. EXAM: CT HEAD WITHOUT CONTRAST TECHNIQUE: Contiguous axial images were obtained from the base of the skull through the vertex without intravenous contrast. RADIATION DOSE REDUCTION: This exam was performed according to the departmental dose-optimization program which includes automated exposure control, adjustment of the mA and/or kV according to patient size and/or use of iterative reconstruction technique. COMPARISON:  Head CT 08/01/2022.  FINDINGS: Brain: No acute hemorrhage. 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 Gray-white differentiation is otherwise preserved. Unchanged age advanced volume loss. No acute hydrocephalus or extra-axial collection. No mass effect or midline shift. Vascular: No hyperdense vessel or unexpected calcification. Skull: No calvarial fracture or suspicious bone lesion. Skull base is unremarkable. Sinuses/Orbits: No acute finding. Other: None. IMPRESSION: 1. No acute intracranial abnormality. 2. 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 3. Unchanged age advanced volume loss. Electronically Signed   By: Orvan Falconer M.D.   On: 04/14/2023 10:43   PROCEDURES: Critical Care performed: No .1-3 Lead EKG Interpretation  Performed by: Merwyn Katos, MD Authorized by: Merwyn Katos, MD     Interpretation: normal     ECG rate:  71   ECG rate assessment: normal     Rhythm: sinus rhythm     Ectopy: none     Conduction: normal    MEDICATIONS ORDERED IN ED: Medications  LORazepam (ATIVAN) 2 MG/ML injection (  Not Given 04/14/23 1109)  levothyroxine (SYNTHROID) tablet 75 mcg (has no administration in time range)  sodium chloride flush (NS) 0.9 % injection 3 mL (3 mLs Intravenous Given 04/14/23 1425)  heparin injection 5,000 Units (has no administration in time range)  senna-docusate (Senokot-S) tablet 1 tablet (has no administration in time range)  acetaminophen (TYLENOL) tablet 650 mg (has no administration in time range)    Or  acetaminophen (TYLENOL) suppository 650 mg (has no administration in time range)  ondansetron (ZOFRAN) tablet 4 mg (has no administration in time range)    Or  ondansetron (ZOFRAN) injection 4 mg (has no administration in time range)  LORazepam (ATIVAN) injection 2 mg (has no administration in time range)   IMPRESSION / MDM / ASSESSMENT AND PLAN / ED COURSE  I reviewed the triage vital signs and the nursing notes.                              The patient is on the cardiac monitor to evaluate for evidence of arrhythmia and/or significant heart rate changes. Patient's presentation is most consistent with acute presentation with potential threat to life or bodily function. Patient presents after recent seizure episode.  Patient had slow return to baseline mental and physical function in our emergency department. No immunosuppresion hx and had no preceding fever. No history of alcohol abuse or suspicion for toxin ingestion. Unlikely stroke, syncope. Unlikely infectious etiology. No preceding trauma.  Workup: EKG, BMP, POCT glucose and CT Brain.  Field Interventions: None ED Interventions: None Neurology consult: Spoke to Dr. Wilford Corner recommends checking lithium level and if it is not increased admission to the hospitalist service with EEG. Disposition: Admit to medicine   FINAL CLINICAL  IMPRESSION(S) / ED DIAGNOSES   Final diagnoses:  Seizure-like activity (HCC)  Altered mental status, unspecified altered mental status type   Rx / DC Orders   ED Discharge Orders     None      Note:  This document was prepared using Dragon voice recognition software and may include unintentional dictation errors.   Merwyn Katos, MD 04/14/23 [phone]

## 2023-04-14 NOTE — Assessment & Plan Note (Signed)
With increased serum creatinine, does not meet criteria for acute kidney injury Strict I's and O's

## 2023-04-14 NOTE — ED Notes (Signed)
EEG @bedside , MD Cox @bedside 

## 2023-04-14 NOTE — Progress Notes (Signed)
Eeg done 

## 2023-04-14 NOTE — Assessment & Plan Note (Signed)
Home Ativan 0.5 mg 4 times daily converted to IV dosing resumed on admission Discussed with pharmacy

## 2023-04-14 NOTE — Assessment & Plan Note (Addendum)
With new onset of seizure Check ethanol level, vitamin B1, UDS, procalcitonin, chest x-ray, UA, magnesium Fall precaution, aspiration precaution, seizure precaution MRI of the brain without contrast ordered however not yet completed on admission as patient had an MRI of the brain without contrast at an outside facility on 04/13/2023. Reaching out to the facility to have the imaging shared and a read if possible EEG Admit to telemetry cardiac, inpatient

## 2023-04-14 NOTE — Telephone Encounter (Signed)
Let her know I've been in touch with the ER doc.  Evaluation is in progress

## 2023-04-14 NOTE — Hospital Course (Addendum)
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 Patient is seen by neurology, started on Keppra.  Patient also has suicide ideation, psychiatry consult obtained.

## 2023-04-14 NOTE — H&P (Addendum)
History and Physical   Albert [name] VHQ:[id number] DOB: December 18, 1965 DOA: 04/14/2023  PCP: Kandyce Rud, MD  Outpatient Specialists: Dr. Jennelle Human, psychiatry Patient coming from: Home EMS  I have personally briefly reviewed patient's old medical records in [facility] EMR.  Chief Concern: Witnessed syncope  HPI: 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  Vitals in the ED showed temperature 99.8, respiration rate 18, heart rate of 108, blood pressure 127/80, SpO2 of 95% on room air.  Serum sodium is 138, potassium 3.6, chloride 107, bicarb 20, BUN of 17, serum creatinine 1.50, EGFR 53, nonfasting blood glucose 229, WBC 6.2, hemoglobin 12, platelets of 368.  Lactic acid was initially 5.1 and improved to 1.9.  High since he troponin was 3 and on repeat was 3.  ED treatment: None ----------------------------------------------- At bedside, patient was snoring.  Per nursing, patient just had a seizure, and patient received Ativan 2 mg IV one-time dose and probably fell asleep.  Mother reports that patient has been having difficulty with his memory. She reports that his memory has gone down hill over the last 5 months and worsening depression. He is having difficulty with taking his medications.   Patient has been drinking a lot of water, gatorade. Patient is constipated and takes miralax everyday. Patient has had weight loss since starting wygovy and he is off the wygovy (January to March/April).   Per mother, patient is up to date on colonoscopies and endoscopies.  Social history: Unable to complete. Mother states he lives on his own except for the last 5-6 months.  ROS: Unable to complete  ED Course: Discussed with EDP, patient requiring hospitalization for witnessed syncope.    Assessment/Plan  Principal Problem:   Syncope Active Problems:   Severe depressed bipolar I disorder without psychotic features (HCC)   GAD (generalized anxiety disorder)   Benign hypertensive kidney disease with chronic kidney disease   Essential hypertriglyceridemia   Gastroesophageal reflux disease without esophagitis   Secondary hyperparathyroidism of renal origin (HCC)   Stage 3 chronic kidney disease (HCC)   Seizure (HCC)   Bipolar 1 disorder (HCC)   Lactic acid acidosis   Constipation   Assessment and Plan:  * Syncope With new onset of seizure Check ethanol level, vitamin B1, UDS, procalcitonin, chest x-ray, UA, magnesium Fall precaution, aspiration precaution, seizure precaution MRI of the brain without contrast ordered however not yet completed on admission as patient had an MRI of the brain without contrast at an outside facility on 04/13/2023. Reaching out to the facility to have the imaging shared and a read if possible EEG Admit to telemetry cardiac, inpatient  Constipation MiraLAX twice daily resumed  Lactic acid acidosis Resolved, Low clinical suspicion for infectious etiology at this time, suspect secondary to seizure  Bipolar 1 disorder (HCC) Severe History of failing multiple psychiatric medication Behavioral health has been consulted for medication optimization and holding off fluoxamine 100 mg, 1/2 tablet at bedtime and then 1 tablet at bedtime Cloxapine 5 at bedtime  Seizure (HCC) New onset Lorazepam 2 mg IV as needed as needed for > 5 seizure (per neurology), 2 doses ordered on admission  Stage 3 chronic kidney disease (HCC) With increased serum creatinine, does not meet criteria for acute kidney injury Strict I's and O's  GAD (generalized anxiety disorder) Home Ativan 0.5 mg 4 times  daily converted to IV dosing resumed on admission Discussed with pharmacy  Chart reviewed.   DVT prophylaxis: heparin 5000 units q8h  Code Status: full  code Diet: NPO Family Communication: updated his mother, Patsy over the phone Disposition Plan: pending clinical course Consults called: neurology Admission status: telemetry cardiac, inpatient  Past Medical History:  Diagnosis Date   ADHD (attention deficit hyperactivity disorder)    Anemia    Anxiety    Bipolar disorder (HCC)    BPH (benign prostatic hyperplasia)    Chronic kidney disease, stage 3b (HCC)    Constipation    DDD (degenerative disc disease), cervical    Depression    Deviated septum    ED (erectile dysfunction)    GERD (gastroesophageal reflux disease)    Graves disease    History of hiatal hernia    Hypertension    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    Hypothyroidism    Pneumonia    PONV (postoperative nausea and vomiting)    Past Surgical History:  Procedure Laterality Date   BACK SURGERY     lumbar disc   COLONOSCOPY N/A 12/23/2022   Procedure: COLONOSCOPY;  Surgeon: Jaynie Collins, DO;  Location: [facility] ENDOSCOPY;  Service: Gastroenterology;  Laterality: N/A;   colonoscopyx2 N/A    2003, 2016   ESOPHAGOGASTRODUODENOSCOPY     1998, 2002, 2003, 2016   ESOPHAGOGASTRODUODENOSCOPY (EGD) WITH PROPOFOL N/A 09/12/2020   Procedure: ESOPHAGOGASTRODUODENOSCOPY (EGD) WITH PROPOFOL;  Surgeon: Earline Mayotte, MD;  Location: [facility] ENDOSCOPY;  Service: Endoscopy;  Laterality: N/A;  1ST CASE   EYE SURGERY  2006   strabismus surgery   HERNIA REPAIR     Hiatal Hernia   INSERTION OF MESH Bilateral 03/03/2021   Procedure: INSERTION OF MESH;  Surgeon: Leafy Ro, MD;  Location: [facility] ORS;  Service: General;  Laterality: Bilateral;   NASAL SEPTUM SURGERY     NM I- 131 THERAPY FOR ABLATION ([facility] HX)  04/11/2015   thyroid   SPINE SURGERY     XI ROBOTIC ASSISTED PARAESOPHAGEAL HERNIA REPAIR N/A 10/23/2020   Procedure: XI ROBOTIC ASSISTED PARAESOPHAGEAL HERNIA REPAIR WITH MESH AND INCISIONAL HERNIA REPAIR WITH MESH;  Surgeon: Leafy Ro, MD;  Location: [facility] ORS;  Service: General;  Laterality: N/A;   Social History:  reports that he has quit smoking. His smoking use included cigarettes. He has been exposed to tobacco smoke. He has quit using smokeless tobacco.  His smokeless tobacco use included chew. He reports that he does not currently use alcohol. He reports that he does not use drugs.  Allergies  Allergen Reactions   Meloxicam Other (See Comments)    Dizziness   Nsaids Other (See Comments)    Hallucinations   Ibuprofen Other (See Comments)    Can not take because taking lithium   Prednisone Other (See Comments)    Can't sleep    Wellbutrin [Bupropion] Other (See Comments)    Suicidal thoughts   Family History  Problem Relation Age of Onset   Prostate cancer Neg Hx    Bladder Cancer Neg Hx    Kidney cancer Neg Hx    Family history: Family history reviewed and not pertinent.  Prior to Admission medications   Medication Sig Start Date End Date Taking? Authorizing Provider  acetaminophen (TYLENOL) 500 MG tablet Take 1,000 mg by mouth every 6 (six) hours as needed for moderate pain.    [provider]  atropine 1 % ophthalmic solution  Place 2 drops under the tongue 3 (three) times daily as needed. 02/09/23   Cottle, Steva Ready., MD  bisacodyl (DULCOLAX) 10 MG suppository Place 1 suppository (10 mg total) rectally as needed for moderate constipation. 04/02/22   Jene Every, MD  cloZAPine (CLOZARIL) 100 MG tablet Take 5 tablets (500 mg total) by mouth at bedtime. 04/12/23   Cottle, Steva Ready., MD  esomeprazole (NEXIUM) 40 MG capsule Take 40 mg by mouth daily. 06/17/22   [provider]  fenofibrate (TRICOR) 145 MG tablet Take 145 mg by mouth daily. 04/02/19   [provider]  fluticasone (FLONASE) 50 MCG/ACT nasal spray Place 1 spray into both nostrils daily as needed for allergies or rhinitis.    [provider]  fluvoxaMINE (LUVOX) 100 MG tablet 1/2 tablet in the morning for 1  week, then 1 tablet each morning 04/12/23   Cottle, Steva Ready., MD  hydrocortisone (ANUSOL-HC) 2.5 % rectal cream Place 1 application rectally 3 (three) times daily as needed for hemorrhoids. 05/20/20   [provider]  levothyroxine (SYNTHROID, LEVOTHROID) 75 MCG tablet Take 75 mcg by mouth daily before breakfast. 09/14/17   [provider]  lisinopril (ZESTRIL) 5 MG tablet Take 5 mg by mouth daily. 10/19/19   [provider]  lithium carbonate 150 MG capsule TAKE 1 CAPSULE BY MOUTH AT BEDTIME 03/28/23   Cottle, Steva Ready., MD  LORazepam (ATIVAN) 0.5 MG tablet TAKE ONE TABLET BY MOUTH FOUR TIMES DAILY 03/21/23   Cottle, Steva Ready., MD  meclizine (ANTIVERT) 25 MG tablet Take 1 tablet (25 mg total) by mouth 3 (three) times daily as needed for dizziness. 10/15/21   Cottle, Steva Ready., MD  polycarbophil (FIBERCON) 625 MG tablet Take 1,250 mg by mouth daily.    [provider]  risperidone (RISPERDAL) 4 MG tablet TAKE 1 TABLET BY MOUTH TWICE DAILY Patient taking differently: Take 4 mg by mouth daily. 03/17/23   Cottle, Steva Ready., MD  sildenafil (VIAGRA) 100 MG tablet Take 1 tablet (100 mg total) by mouth daily as needed for erectile dysfunction. 12/30/22   Sondra Come, MD  tamsulosin (FLOMAX) 0.4 MG CAPS capsule Take 1 capsule (0.4 mg total) by mouth daily. 12/30/22   Sondra Come, MD  WEGOVY 1 MG/0.5ML SOAJ Inject 1 mg into the skin once a week. 07/26/22   [provider]   Physical Exam: Vitals:   04/14/23 1032 04/14/23 1200 04/14/23 1230 04/14/23 1300  BP: 127/80 129/87 (!) 125/92 (!) 112/93  Pulse: (!) 108 91 89 88  Resp: 18 20 19 19   Temp: 99.8 F (37.7 C)     TempSrc: Oral     SpO2: 95% 93% 94% 94%   Constitutional: appears older than chronological age, sleeping Eyes: PERRL, lids and conjunctivae normal ENMT: Mucous membranes are dry.  Unable to assess posterior pharynx. Age-appropriate dentition.  Unable to assess hearing Neck:  normal, supple, no masses, no thyromegaly Respiratory: clear to auscultation bilaterally, no wheezing, no crackles. Normal respiratory effort. No accessory muscle use.  Cardiovascular: Regular rate and rhythm, no murmurs / rubs / gallops.  Trace edema bilateral lower extremity. 2+ pedal pulses. No carotid bruits.  Abdomen: no tenderness, no masses palpated, no hepatosplenomegaly. Bowel sounds positive.  Musculoskeletal: no clubbing / cyanosis. No joint deformity upper and lower extremities. Good ROM, no contractures, no atrophy. Normal muscle tone.  Skin: no rashes, lesions, ulcers. No induration Neurologic: Sensation intact. Strength 5/5 in all  4.  Psychiatric: Unable to assess judgment, insight, alertness, orientation, mood.   EKG: independently reviewed, showing sinus tachycardia with rate of 110, QTc 452  Chest x-ray on Admission: I personally reviewed and I agree with radiologist reading as below.  DG Chest Port 1 View Result Date: 04/14/2023 CLINICAL DATA:  Syncope EXAM: PORTABLE CHEST 1 VIEW COMPARISON:  X-ray 10/24/2020. FINDINGS: No consolidation, pneumothorax or effusion. No edema. Normal cardiopericardial silhouette. The right inferior costophrenic angle is clipped off the edge of the film. Overlapping cardiac leads. IMPRESSION: No acute cardiopulmonary disease. Electronically Signed   By: Karen Kays M.D.   On: 04/14/2023 15:32   CT Head Wo Contrast Result Date: 04/14/2023 CLINICAL DATA:  Seizure, new onset, no history of trauma. EXAM: CT HEAD WITHOUT CONTRAST TECHNIQUE: Contiguous axial images were obtained from the base of the skull through the vertex without intravenous contrast. RADIATION DOSE REDUCTION: This exam was performed according to the departmental dose-optimization program which includes automated exposure control, adjustment of the mA and/or kV according to patient size and/or use of iterative reconstruction technique. COMPARISON:  Head CT 08/01/2022. FINDINGS: Brain:  No acute hemorrhage. 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 Gray-white differentiation is otherwise preserved. Unchanged age advanced volume loss. No acute hydrocephalus or extra-axial collection. No mass effect or midline shift. Vascular: No hyperdense vessel or unexpected calcification. Skull: No calvarial fracture or suspicious bone lesion. Skull base is unremarkable. Sinuses/Orbits: No acute finding. Other: None. IMPRESSION: 1. No acute intracranial abnormality. 2. 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 3. Unchanged age advanced volume loss. Electronically Signed   By: Orvan Falconer M.D.   On: 04/14/2023 10:43    Labs on Admission: I have personally reviewed following labs CBC: Recent Labs  Lab 04/14/23 1047  WBC 6.2  NEUTROABS 3.8  HGB 12.0*  HCT 36.9*  MCV 94.6  PLT 368   Basic Metabolic Panel: Recent Labs  Lab 04/14/23 1047  NA 138  K 3.6  CL 107  CO2 20*  GLUCOSE 229*  BUN 17  CREATININE 1.50*  CALCIUM 9.2   GFR: CrCl cannot be calculated (Unknown ideal weight.). Liver Function Tests: Recent Labs  Lab 04/14/23 1047  AST 21  ALT 10  ALKPHOS 39  BILITOT 0.3  PROT 6.5  ALBUMIN 3.8   Urine analysis:    Component Value Date/Time   COLORURINE YELLOW (A) 04/14/2023 1441   APPEARANCEUR CLEAR (A) 04/14/2023 1441   APPEARANCEUR Clear 12/22/2017 1552   LABSPEC 1.009 04/14/2023 1441   LABSPEC 1.013 03/18/2014 1459   PHURINE 7.0 04/14/2023 1441   GLUCOSEU NEGATIVE 04/14/2023 1441   GLUCOSEU Negative 03/18/2014 1459   HGBUR NEGATIVE 04/14/2023 1441   BILIRUBINUR NEGATIVE 04/14/2023 1441   BILIRUBINUR Negative 12/22/2017 1552   BILIRUBINUR Negative 03/18/2014 1459   KETONESUR NEGATIVE 04/14/2023 1441   PROTEINUR NEGATIVE 04/14/2023 1441   UROBILINOGEN 0.2 05/01/2009 1104   NITRITE NEGATIVE 04/14/2023 1441   LEUKOCYTESUR NEGATIVE 04/14/2023 1441   LEUKOCYTESUR Negative 03/18/2014 1459   This document was prepared using  Dragon Voice Recognition software and may include unintentional dictation errors.  Dr. Sedalia Muta [facility]  If 7PM-7AM, please contact overnight-coverage provider If 7AM-7PM, please contact day attending provider [email or web address]  04/14/2023, 4:20 PM

## 2023-04-14 NOTE — Telephone Encounter (Signed)
Please see message. Meko is in the hospital and mom wanted you to know.

## 2023-04-14 NOTE — Assessment & Plan Note (Addendum)
Severe History of failing multiple psychiatric medication Behavioral health has been consulted for medication optimization and holding off fluoxamine 100 mg, 1/2 tablet at bedtime and then 1 tablet at bedtime Cloxapine 5 at bedtime

## 2023-04-14 NOTE — Telephone Encounter (Signed)
Mom notified. I told her that we could see their records and they could see our records as well.

## 2023-04-14 NOTE — Consult Note (Signed)
NEUROLOGY CONSULT NOTE   Date of service: April 14, 2023 Patient Name: Albert [name] MRN:  [id number] DOB:  1965/08/16 Chief Complaint: "Seizure" Requesting Provider: Lovenia Kim, DO  History of Present Illness  Albert [name] is a 57 y.o. 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   Presented to the emergency department for evaluation of seizure at home.  While in the hospital had another seizure. Between those 2 seizures, he was slow to come to and remained very lethargic for which she was already admitted to the hospitalist service. He had a recent change in his medication with his psychiatrist, Dr. Jennelle Human, after a video visit yesterday-he was seen for complaints and multiple phone calls for intrusive obsessive HI and SI thoughts without plan or intent.  He did not show up to the office because of these thoughts and wanted a video visit.  He was started on fluvoxamine and asked to reduce risperidone to half of a 4 mg tablet in the evening for 1 week and then stop it.  He was also asked to reduce clozapine to 5 tablets nightly.  Fluvoxamine was started at 100 mg tablet half tablet nightly for 1 week then 1 tablet nightly for obsessive/intrusive thoughts. He has in the past also being treated at [facility] with ECT and multiple other medications-please refer to the psychiatry note from 04/12/2023  Has been seen by Dr. Clelia Croft at [facility] clinic neurology for cognitive impairment.  Had an MRI done at [facility]  yesterday-we will try to obtain images.  Family denies any knowledge of illicit drug use or excessive medication use.  He is usually very cautious about the medications he uses a double checks with his parents and have them double check the pillboxes   Had an episode of shaking like activity this morning where he nearly fell off the bed.  He was very somnolent after that.  He was in the ED being monitored but still very lethargic for which she was admitted since he was not back to baseline-had another episode of seizure-like activity that lasted a couple minutes in the ED.  He was given Ativan very soon before my examination which limits my examination quite a bit.   ROS  ROS obtained via family-pertinent positives in HPI.  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    Hypothyroidism    Pneumonia    PONV (postoperative nausea and vomiting)     Past Surgical History:  Procedure Laterality Date   BACK SURGERY     lumbar disc  COLONOSCOPY N/A 12/23/2022   Procedure: COLONOSCOPY;  Surgeon: Jaynie Collins, DO;  Location: [facility] ENDOSCOPY;  Service: Gastroenterology;  Laterality: N/A;   colonoscopyx2 N/A    2003, 2016   ESOPHAGOGASTRODUODENOSCOPY     1998, 2002, 2003, 2016   ESOPHAGOGASTRODUODENOSCOPY (EGD) WITH PROPOFOL N/A 09/12/2020   Procedure: ESOPHAGOGASTRODUODENOSCOPY (EGD) WITH PROPOFOL;  Surgeon: Earline Mayotte, MD;  Location: [facility] ENDOSCOPY;  Service: Endoscopy;  Laterality: N/A;  1ST CASE    EYE SURGERY  2006   strabismus surgery   HERNIA REPAIR     Hiatal Hernia   INSERTION OF MESH Bilateral 03/03/2021   Procedure: INSERTION OF MESH;  Surgeon: Leafy Ro, MD;  Location: [facility] ORS;  Service: General;  Laterality: Bilateral;   NASAL SEPTUM SURGERY     NM I- 131 THERAPY FOR ABLATION ([facility] HX)  04/11/2015   thyroid   SPINE SURGERY     XI ROBOTIC ASSISTED PARAESOPHAGEAL HERNIA REPAIR N/A 10/23/2020   Procedure: XI ROBOTIC ASSISTED PARAESOPHAGEAL HERNIA REPAIR WITH MESH AND INCISIONAL HERNIA REPAIR WITH MESH;  Surgeon: Leafy Ro, MD;  Location: [facility] ORS;  Service: General;  Laterality: N/A;    Family History: Family History  Problem Relation Age of Onset   Prostate cancer Neg Hx    Bladder Cancer Neg Hx    Kidney cancer Neg Hx     Social History  reports that he has quit smoking. His smoking use included cigarettes. He has been exposed to tobacco smoke. He has quit using smokeless tobacco.  His smokeless tobacco use included chew. He reports that he does not currently use alcohol. He reports that he does not use drugs.  Allergies  Allergen Reactions   Meloxicam Other (See Comments)    Dizziness   Nsaids Other (See Comments)    Hallucinations   Ibuprofen Other (See Comments)    Can not take because taking lithium   Prednisone Other (See Comments)    Can't sleep    Wellbutrin [Bupropion] Other (See Comments)    Suicidal thoughts    Medications   Current Facility-Administered Medications:    acetaminophen (TYLENOL) tablet 650 mg, 650 mg, Oral, Q6H PRN **OR** acetaminophen (TYLENOL) suppository 650 mg, 650 mg, Rectal, Q6H PRN, Cox, Amy N, DO   heparin injection 5,000 Units, 5,000 Units, Subcutaneous, Q8H, Cox, Amy N, DO   levETIRAcetam (KEPPRA) 2,000 mg in sodium chloride 0.9 % 250 mL IVPB, 2,000 mg, Intravenous, Once, Bradler, Clent Jacks, MD   [START ON 04/15/2023] levothyroxine (SYNTHROID) tablet 75 mcg, 75 mcg, Oral, QAC breakfast, Cox, Amy N, DO   LORazepam  (ATIVAN) injection 2 mg, 2 mg, Intravenous, PRN, Cox, Amy N, DO, 2 mg at 04/14/23 1502   ondansetron (ZOFRAN) tablet 4 mg, 4 mg, Oral, Q6H PRN **OR** ondansetron (ZOFRAN) injection 4 mg, 4 mg, Intravenous, Q6H PRN, Cox, Amy N, DO   senna-docusate (Senokot-S) tablet 1 tablet, 1 tablet, Oral, QHS PRN, Cox, Amy N, DO   sodium chloride flush (NS) 0.9 % injection 3 mL, 3 mL, Intravenous, Q12H, Cox, Amy N, DO, 3 mL at 04/14/23 1425  Current Outpatient Medications:    acetaminophen (TYLENOL) 500 MG tablet, Take 1,000 mg by mouth every 6 (six) hours as needed for moderate pain., Disp: , Rfl:    atropine 1 % ophthalmic solution, Place 2 drops under the tongue 3 (three) times daily as needed., Disp: 2 mL, Rfl: 1   bisacodyl (DULCOLAX) 10 MG suppository, Place 1 suppository (10 mg total) rectally as needed  for moderate constipation., Disp: 12 suppository, Rfl: 0   cloZAPine (CLOZARIL) 100 MG tablet, Take 5 tablets (500 mg total) by mouth at bedtime., Disp: 75 tablet, Rfl: 2   esomeprazole (NEXIUM) 40 MG capsule, Take 40 mg by mouth daily., Disp: , Rfl:    fenofibrate (TRICOR) 145 MG tablet, Take 145 mg by mouth daily., Disp: , Rfl:    fluticasone (FLONASE) 50 MCG/ACT nasal spray, Place 1 spray into both nostrils daily as needed for allergies or rhinitis., Disp: , Rfl:    fluvoxaMINE (LUVOX) 100 MG tablet, 1/2 tablet in the morning for 1 week, then 1 tablet each morning, Disp: 30 tablet, Rfl: 0   hydrocortisone (ANUSOL-HC) 2.5 % rectal cream, Place 1 application rectally 3 (three) times daily as needed for hemorrhoids., Disp: , Rfl:    levothyroxine (SYNTHROID, LEVOTHROID) 75 MCG tablet, Take 75 mcg by mouth daily before breakfast., Disp: , Rfl:    lisinopril (ZESTRIL) 5 MG tablet, Take 5 mg by mouth daily., Disp: , Rfl:    lithium carbonate 150 MG capsule, TAKE 1 CAPSULE BY MOUTH AT BEDTIME, Disp: 30 capsule, Rfl: 0   LORazepam (ATIVAN) 0.5 MG tablet, TAKE ONE TABLET BY MOUTH FOUR TIMES DAILY, Disp: 120  tablet, Rfl: 0   meclizine (ANTIVERT) 25 MG tablet, Take 1 tablet (25 mg total) by mouth 3 (three) times daily as needed for dizziness., Disp: 30 tablet, Rfl: 1   polycarbophil (FIBERCON) 625 MG tablet, Take 1,250 mg by mouth daily., Disp: , Rfl:    risperidone (RISPERDAL) 4 MG tablet, TAKE 1 TABLET BY MOUTH TWICE DAILY (Patient taking differently: Take 4 mg by mouth daily.), Disp: 60 tablet, Rfl: 0   sildenafil (VIAGRA) 100 MG tablet, Take 1 tablet (100 mg total) by mouth daily as needed for erectile dysfunction., Disp: 30 tablet, Rfl: 6   tamsulosin (FLOMAX) 0.4 MG CAPS capsule, Take 1 capsule (0.4 mg total) by mouth daily., Disp: 90 capsule, Rfl: 3   WEGOVY 1 MG/0.5ML SOAJ, Inject 1 mg into the skin once a week., Disp: , Rfl:   Vitals   Vitals:   05/07/23 1032 05-07-23 1200 2023/05/07 1230 07-May-2023 1300  BP: 127/80 129/87 (!) 125/92 (!) 112/93  Pulse: (!) 108 91 89 88  Resp: 18 20 19 19   Temp: 99.8 F (37.7 C)     TempSrc: Oral     SpO2: 95% 93% 94% 94%    There is no height or weight on file to calculate BMI.  Physical Exam  Obtunded Supple neck Does not follow commands Does not open eyes to noxious stimulation Nonverbal Pupils equal round reactive to light with gaze midline. Does not blink to threat from either side Face is symmetric To noxious stimulation has minimal withdrawal in all 4 extremities Has some sonorous breathing-just received 2 mg of Ativan IV.   Labs/Imaging/Neurodiagnostic studies   CBC:  Recent Labs  Lab 2023-05-07 1047  WBC 6.2  NEUTROABS 3.8  HGB 12.0*  HCT 36.9*  MCV 94.6  PLT 368   Basic Metabolic Panel:  Lab Results  Component Value Date   NA 138 2023/05/07   K 3.6 05-07-2023   CO2 20 (L) May 07, 2023   GLUCOSE 229 (H) 05-07-23   BUN 17 05/07/23   CREATININE 1.50 (H) 2023/05/07   CALCIUM 9.2 05/07/23   GFRNONAA 53 (L) 07-May-2023   GFRAA 54 (L) 04/18/2019   Lipid Panel:  Lab Results  Component Value Date   LDLCALC 66  08/12/2022   HgbA1c:  Lab Results  Component Value Date   HGBA1C 5.4 08/12/2022   Urine Drug Screen:     Component Value Date/Time   LABOPIA NONE DETECTED 04/14/2023 1441   LABOPIA NONE DETECTED 08/01/2022 1729   COCAINSCRNUR NONE DETECTED 04/14/2023 1441   LABBENZ NONE DETECTED 04/14/2023 1441   LABBENZ POSITIVE (A) 08/01/2022 1729   AMPHETMU NONE DETECTED 04/14/2023 1441   AMPHETMU NONE DETECTED 08/01/2022 1729   THCU NONE DETECTED 04/14/2023 1441   THCU NONE DETECTED 08/01/2022 1729   LABBARB NONE DETECTED 04/14/2023 1441   LABBARB NONE DETECTED 08/01/2022 1729  Lithium level-0.20   CT Head without contrast(Personally reviewed): No new findings.  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    ASSESSMENT   Albert [name] is a 57 y.o. 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  Presenting for evaluation of seizure activity and had another seizure in the hospital.  He did start to come around after the first seizure but still was not back at baseline and had already been admitted before he had the second seizure.  He was given Ativan after the second seizure making the exam somewhat challenging.  He had brain MRI done yesterday at an outside facility-we will attempt to get the records but if you are unable to get the records in a day or so, may need to repeat brain imaging here.  No fever or white count-low suspicion for CNS infection  Impression: New onset seizure in a patient with  extensive psychiatric history and polypharmacy for bipolar disorder, anxiety and depression.   RECOMMENDATIONS  He has been loaded with Keppra in the ED I will continue Keppra 500 twice daily Routine EEG-if there is anything concerning or abnormal, may need transfer to [facility] for LTM EEG He is on multiple psychotropic medications at home.  I would recommend involving psychiatry for optimization of his medication regimen while inpatient. I would use Ativan 2 mg IV for any seizure greater than 5 minutes. Maintain seizure precautions I would also put him back on his home Ativan dose to avoid any benzodiazepine withdrawal seizures. If possible and okay with psychiatry, I would hold off on the fluvoxamine since he only started yesterday.  Appreciate their input.  Brain MRI-done yesterday at outside facility-[facility]-have requested ER secretary to get the records.  I have also called the imaging facility and asked them to expedite the read and have been assured by the radiology representative that they will push the scan up to the radiologist's read list as stat.  I will follow this up tomorrow in Care Everywhere once available. Hydration and recheck lactate-likely lactic acidosis secondary to seizures.  No fever or leukocytosis to suspect CNS infection.  Will hold off on LP.   Preliminary plan discussed with Dr. Vicente Males.  Final plan relayed to Dr. Sedalia Muta via secure chat   -- Signed, Milon Dikes, MD [facility]

## 2023-04-14 NOTE — Assessment & Plan Note (Signed)
Resolved, Low clinical suspicion for infectious etiology at this time, suspect secondary to seizure

## 2023-04-14 NOTE — ED Notes (Signed)
Pt was having a tonic clonic seizure, airways suctioned, VSS obtained and MD Bradler @bedside . PRN Ativan adminstered. Seizure activity stopped.

## 2023-04-14 NOTE — ED Notes (Signed)
Neuro MD @bedside , stated to withhold ativan unless seizures actively last longer than 5 mins.

## 2023-04-14 NOTE — Assessment & Plan Note (Signed)
MiraLAX twice daily resumed

## 2023-04-14 NOTE — Assessment & Plan Note (Addendum)
New onset Lorazepam 2 mg IV as needed as needed for > 5 seizure (per neurology), 2 doses ordered on admission

## 2023-04-14 NOTE — Telephone Encounter (Signed)
Pt's mom called at 11:25a.  She states pt was getting read for a doctor's appt this morning and dad was going to drive him because mom thought he seemed a little "out of it".  When dad went to the room to see if pt was ready to leave, pt had fallen out and his arms were drawn up and rigid (I asked he was passed out, but she didn't say yes).  She said he was non-responsive when she talked to him.  They called the EMT and he was transported to the hospital, where he is currently.  She said pt started the Luvox half a pill yesterday.  She said she didn't think taking half a pill would have caused this but she wanted Dr Jennelle Human to know pt's status.  She said that they just heard from the EMT that they had done blood work and a CT scan, but don't know the results yet.  She is asking for a call back at [phone].

## 2023-04-14 NOTE — ED Triage Notes (Signed)
Pt via [facility] from home. Pt had a witnessed syncopal episode. Didn't hit head, denies blood thinners. Fire reports snoring respiration. Reports "jerking motion" denies hx of seizures. Pt is responsive to painful stimuli at this time.

## 2023-04-14 NOTE — Procedures (Signed)
Patient Name: Albert [name]  MRN: [id number]  Epilepsy Attending: Charlsie Quest  Referring Physician/Provider: Lovenia Kim, DO  Date: 04/14/2023 Duration: 30.04 mins  Patient history: 57 yo m with seizures getting eeg to evaluate for seizure  Level of alertness:  comatose/ lethargic   AEDs during EEG study: LEV, Ativan  Technical aspects: This EEG study was done with scalp electrodes positioned according to the 10-20 International system of electrode placement. Electrical activity was reviewed with band pass filter of 1-70Hz , sensitivity of 7 uV/mm, display speed of 52mm/sec with a 60Hz  notched filter applied as appropriate. EEG data were recorded continuously and digitally stored.  Video monitoring was available and reviewed as appropriate.  Description: EEG showed continuous generalized 3 to 6 Hz theta-delta slowing admixed with 12-14hz  beta activity. Hyperventilation and photic stimulation were not performed.     ABNORMALITY - Continuous slow, generalized  IMPRESSION: This study is suggestive of moderate to severe diffuse encephalopathy. No seizures or epileptiform discharges were seen throughout the recording.  [name]

## 2023-04-15 DIAGNOSIS — R569 Unspecified convulsions: Secondary | ICD-10-CM | POA: Diagnosis not present

## 2023-04-15 DIAGNOSIS — N1831 Chronic kidney disease, stage 3a: Secondary | ICD-10-CM

## 2023-04-15 DIAGNOSIS — R45851 Suicidal ideations: Secondary | ICD-10-CM

## 2023-04-15 DIAGNOSIS — F314 Bipolar disorder, current episode depressed, severe, without psychotic features: Secondary | ICD-10-CM

## 2023-04-15 LAB — BASIC METABOLIC PANEL
Anion gap: 8 (ref 5–15)
BUN: 20 mg/dL (ref 6–20)
CO2: 22 mmol/L (ref 22–32)
Calcium: 9.2 mg/dL (ref 8.9–10.3)
Chloride: 115 mmol/L — ABNORMAL HIGH (ref 98–111)
Creatinine, Ser: 1.68 mg/dL — ABNORMAL HIGH (ref 0.61–1.24)
GFR, Estimated: 47 mL/min — ABNORMAL LOW (ref >60)
Glucose, Bld: 88 mg/dL (ref 70–99)
Potassium: 3.5 mmol/L (ref 3.5–5.1)
Sodium: 141 mmol/L (ref 135–145)

## 2023-04-15 LAB — CBC
HCT: 33.6 % — ABNORMAL LOW (ref 39.0–52.0)
Hemoglobin: 11.1 g/dL — ABNORMAL LOW (ref 13.0–17.0)
MCH: 31 pg (ref 26.0–34.0)
MCHC: 33 g/dL (ref 30.0–36.0)
MCV: 93.9 fL (ref 80.0–100.0)
Platelets: 396 10*3/uL (ref 150–400)
RBC: 3.58 MIL/uL — ABNORMAL LOW (ref 4.22–5.81)
RDW: 14.3 % (ref 11.5–15.5)
WBC: 9.5 10*3/uL (ref 4.0–10.5)
nRBC: 0 % (ref 0.0–0.2)

## 2023-04-15 LAB — PROCALCITONIN: Procalcitonin: <0.1 ng/mL

## 2023-04-15 MED ORDER — LITHIUM CARBONATE 150 MG PO CAPS
150.0000 mg | ORAL_CAPSULE | Freq: Every day | ORAL | Status: DC
  Administered 2023-04-15 – 2023-04-16 (×2): 150 mg via ORAL
  Filled 2023-04-15 (×3): qty 1

## 2023-04-15 MED ORDER — FLUVOXAMINE MALEATE 50 MG PO TABS
100.0000 mg | ORAL_TABLET | Freq: Every day | ORAL | Status: DC
  Filled 2023-04-15: qty 2

## 2023-04-15 MED ORDER — RISPERIDONE 1 MG PO TABS
4.0000 mg | ORAL_TABLET | Freq: Two times a day (BID) | ORAL | Status: DC
  Filled 2023-04-15: qty 4

## 2023-04-15 MED ORDER — FLUVOXAMINE MALEATE 50 MG PO TABS
50.0000 mg | ORAL_TABLET | Freq: Every day | ORAL | Status: DC
  Administered 2023-04-15 – 2023-04-16 (×2): 50 mg via ORAL
  Filled 2023-04-15 (×3): qty 1

## 2023-04-15 MED ORDER — CLOZAPINE 100 MG PO TABS
500.0000 mg | ORAL_TABLET | Freq: Every day | ORAL | Status: DC
  Administered 2023-04-15: 500 mg via ORAL
  Filled 2023-04-15: qty 5

## 2023-04-15 MED ORDER — RISPERIDONE 0.5 MG PO TABS
2.0000 mg | ORAL_TABLET | Freq: Every evening | ORAL | Status: DC
  Administered 2023-04-15 – 2023-04-16 (×2): 2 mg via ORAL
  Filled 2023-04-15: qty 2
  Filled 2023-04-15: qty 4

## 2023-04-15 MED ORDER — RISPERIDONE 1 MG PO TABS: 0.5000 mg | ORAL_TABLET | Freq: Once | ORAL | Status: DC

## 2023-04-15 MED ORDER — FENOFIBRATE 54 MG PO TABS
54.0000 mg | ORAL_TABLET | Freq: Every day | ORAL | Status: DC
  Administered 2023-04-16 – 2023-04-17 (×2): 54 mg via ORAL
  Filled 2023-04-15 (×2): qty 1

## 2023-04-15 NOTE — Progress Notes (Signed)
  Progress Note   Patient: Albert [name] NWG:[id number] DOB: Aug 02, 1965 DOA: 04/14/2023     1 DOS: the patient was seen and examined on 04/15/2023   Brief hospital course: 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 Patient is seen by neurology, started on Keppra.  Patient also has suicide ideation, psychiatry consult obtained.   Principal Problem:   Syncope Active Problems:   Severe depressed bipolar I disorder without psychotic features (HCC)   GAD (generalized anxiety disorder)   Benign hypertensive kidney disease with chronic kidney disease   Essential hypertriglyceridemia   Gastroesophageal reflux disease without esophagitis   Secondary hyperparathyroidism of renal origin (HCC)   CKD stage 3a, GFR 45-59 ml/min (HCC)   Seizure (HCC)   Bipolar 1 disorder (HCC)   Lactic acid acidosis   Constipation   Assessment and Plan: *New seizure. Patient does seem to have prolonged confusion after seizure episode, EEG showed encephalopathy.  Patient has been seen by neurology, pending report from outside brain MRI. Patient is started on Keppra, will continue.  Suicidal ideation. Patient voiced suicidal ideation, will obtain psychiatry consult.  Constipation MiraLAX twice daily resumed  Lactic acid acidosis Resolved, Low clinical suspicion for infectious etiology at this time, suspect secondary to seizure  Bipolar 1 disorder (HCC) Resume home medicines.   CKD stage 3a, GFR 45-59 ml/min (HCC) Continue to follow.  GAD (generalized anxiety disorder) Home Ativan 0.5 mg 4 times daily converted to IV dosing resumed on admission       Subjective:  Patient doing better today, but voices suicidal ideation.  Physical Exam: Vitals:   04/15/23 1200 04/15/23 1230 04/15/23 1300 04/15/23 1400   BP: (!) 130/93 (!) 125/95 122/88 (!) 128/97  Pulse: 85 84 87 89  Resp: (!) 22 19 12  (!) 21  Temp:      TempSrc:      SpO2: 100% 100% 98% 100%   General exam: Appears calm and comfortable  Respiratory system: Clear to auscultation. Respiratory effort normal. Cardiovascular system: S1 & S2 heard, RRR. No JVD, murmurs, rubs, gallops or clicks. No pedal edema. Gastrointestinal system: Abdomen is nondistended, soft and nontender. No organomegaly or masses felt. Normal bowel sounds heard. Central nervous system: Alert and oriented x3. No focal neurological deficits. Extremities: Symmetric 5 x 5 power. Skin: No rashes, lesions or ulcers Psychiatry: Judgement and insight appear normal. Mood & affect appropriate.    Data Reviewed:  Lab results reviewed.  Family Communication: None  Disposition: Status is: Inpatient Remains inpatient appropriate because: Severity of disease.     Time spent: 35 minutes  Author: Marrion Coy, MD 04/15/2023 2:27 PM  For on call review [email or web address].

## 2023-04-15 NOTE — Plan of Care (Signed)
Called tried imaging again-left a voice message for call back and either getting the scan that he had 2 days ago at that facility read or power share the images.  Awaiting callback.   -- Milon Dikes, MD Neurologist [facility]

## 2023-04-15 NOTE — ED Notes (Signed)
RN assisted pt to toilet, pt ambulated on own, did state some dizziness. Pt back in bed, eating lunch, given remote, pt denies other needs

## 2023-04-15 NOTE — ED Notes (Signed)
Pt given 2 grape juices, pt reports dry mouth.

## 2023-04-15 NOTE — ED Notes (Signed)
Pt family wanting to get medications sorted out this RN has informed provider, that there was an incorrect medication dose placed. After patient mother talked in depth with pharmacist regarding patient psychiatric medications. This RN informed Dr. Chipper Herb about family concerns for a possible medication error due to a wrong order, Per Dr. Chipper Herb asked this RN to reorder appropriate dose per patient family.

## 2023-04-15 NOTE — ED Notes (Signed)
Tech assisted pt to bathroom, and stated that pt did not stand well on his own. Pt back into bed.

## 2023-04-15 NOTE — ED Notes (Signed)
Report given to CPOD RN. PT SBAR discussed.

## 2023-04-15 NOTE — ED Notes (Signed)
Pt alert and verbal. Able to have coherent conversation, as he asked about the details of the events leading up to his current hospital stay. Bed alarm remains in place, as patient remains slightly alertered from baseline

## 2023-04-15 NOTE — ED Notes (Signed)
Pt given a snack and beverage

## 2023-04-15 NOTE — Progress Notes (Addendum)
NEUROLOGY CONSULT FOLLOW UP NOTE   Date of service: April 15, 2023 Patient Name: Albert [name] MRN:  [id number] DOB:  03/31/1966  Brief HPI  Albert [name] is a 57 y.o. 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 who presented with  seizures (new onset). One at home and one in the ER with prolonged post ictal lethargy. Recent minimal changes to his longstanding psychiatric medication regimen done by the outpatient psychiatrist-fluvoxamine 100 and clozapine and Risperdal reduced. Takes benzos chronically-no change or sudden discontinuation of benzos.   Interval Hx/subjective  Seen and examined this morning.  Now is able to have full conversations although still feels somewhat tired.  No further seizure activity noted.  Vitals   Vitals:   04/15/23 0000 04/15/23 0100 04/15/23 0330 04/15/23 0415  BP: 117/81 116/83 117/77   Pulse: 93 93 98   Resp: 17 18 (!) 26   Temp:    98.6 F (37 C)  TempSrc:    Oral  SpO2: 95% 95% 96%      There is no height or weight on file to calculate BMI.  Physical Exam   General: Comfortably sleeping in bed, awakens to voice, follows commands HEENT: Normocephalic atraumatic Chest: Clear Cardiovascular: Regular rhythm Abdomen nondistended nontender Extremities warm well-perfused Neurological exam Awake alert oriented x 3 Speech is not dysarthric No evidence of aphasia Mildly diminished attention concentration Cranial nerves II to XII intact Motor examination with no  drift in any of the 4 extremities Sensation intact to light touch without extinction Coordination examination shows no evidence of dysmetria in any of the limbs   Medications  Current Facility-Administered Medications:    acetaminophen (TYLENOL) tablet 650 mg, 650 mg, Oral, Q6H PRN **OR** acetaminophen (TYLENOL) suppository 650 mg, 650 mg, Rectal, Q6H PRN, Cox, Amy N, DO   heparin injection 5,000 Units, 5,000 Units, Subcutaneous, Q8H, Cox, Amy N, DO, 5,000 Units at 04/15/23 4098   hydrALAZINE (APRESOLINE) injection 5 mg, 5 mg, Intravenous, Q6H PRN, Cox, Amy N, DO   levETIRAcetam (KEPPRA) IVPB 500 mg/100 mL premix, 500 mg, Intravenous, Q12H, Cox, Amy N, DO, Stopped at 04/15/23 0012   [START ON 04/16/2023] levothyroxine (SYNTHROID) tablet 75 mcg, 75 mcg, Oral, QAC breakfast, Cox, Amy N, DO   LORazepam (ATIVAN) injection 0.5 mg, 0.5 mg, Intravenous, QID, Cox, Amy N, DO, 0.5 mg at 04/15/23 0937   LORazepam (ATIVAN) injection 2 mg, 2 mg, Intravenous, PRN, Cox, Amy N, DO   ondansetron (ZOFRAN) tablet 4 mg, 4 mg, Oral, Q6H PRN **OR** ondansetron (ZOFRAN) injection 4 mg, 4 mg, Intravenous, Q6H PRN, Cox, Amy N, DO   polyethylene glycol (MIRALAX / GLYCOLAX) packet 17 g, 17 g, Oral, BID, Cox, Amy N, DO, 17 g at 04/15/23 0933   senna-docusate (Senokot-S) tablet 1 tablet, 1 tablet, Oral, QHS PRN, Cox, Amy N, DO   sodium chloride flush (NS) 0.9 % injection 3 mL, 3 mL, Intravenous, Q12H, Cox, Amy N, DO, 3 mL at 04/14/23 2105   tamsulosin (FLOMAX) capsule 0.4 mg, 0.4 mg, Oral, Daily, Cox, Amy N, DO, 0.4 mg at 04/15/23 0932  Current Outpatient Medications:    acetaminophen (TYLENOL)  500 MG tablet, Take 1,000 mg by mouth every 6 (six) hours as needed for moderate pain., Disp: , Rfl:    atropine 1 % ophthalmic solution, Place 2 drops under the tongue 3 (three) times daily as needed., Disp: 2 mL, Rfl: 1   bisacodyl (DULCOLAX) 10 MG suppository, Place 1 suppository (10 mg total) rectally as needed for moderate  constipation., Disp: 12 suppository, Rfl: 0   cloZAPine (CLOZARIL) 100 MG tablet, Take 5 tablets (500 mg total) by mouth at bedtime., Disp: 75 tablet, Rfl: 2   esomeprazole (NEXIUM) 40 MG capsule, Take 40 mg by mouth daily., Disp: , Rfl:    fenofibrate (TRICOR) 145 MG tablet, Take 145 mg by mouth daily., Disp: , Rfl:    fluticasone (FLONASE) 50 MCG/ACT nasal spray, Place 1 spray into both nostrils daily as needed for allergies or rhinitis., Disp: , Rfl:    fluvoxaMINE (LUVOX) 100 MG tablet, 1/2 tablet in the morning for 1 week, then 1 tablet each morning, Disp: 30 tablet, Rfl: 0   hydrocortisone (ANUSOL-HC) 2.5 % rectal cream, Place 1 application rectally 3 (three) times daily as needed for hemorrhoids., Disp: , Rfl:    levothyroxine (SYNTHROID, LEVOTHROID) 75 MCG tablet, Take 75 mcg by mouth daily before breakfast., Disp: , Rfl:    lisinopril (ZESTRIL) 5 MG tablet, Take 5 mg by mouth daily., Disp: , Rfl:    lithium carbonate 150 MG capsule, TAKE 1 CAPSULE BY MOUTH AT BEDTIME, Disp: 30 capsule, Rfl: 0   LORazepam (ATIVAN) 0.5 MG tablet, TAKE ONE TABLET BY MOUTH FOUR TIMES DAILY, Disp: 120 tablet, Rfl: 0   meclizine (ANTIVERT) 25 MG tablet, Take 1 tablet (25 mg total) by mouth 3 (three) times daily as needed for dizziness., Disp: 30 tablet, Rfl: 1   polycarbophil (FIBERCON) 625 MG tablet, Take 1,250 mg by mouth daily., Disp: , Rfl:    risperidone (RISPERDAL) 4 MG tablet, TAKE 1 TABLET BY MOUTH TWICE DAILY (Patient taking differently: Take 4 mg by mouth daily.), Disp: 60 tablet, Rfl: 0   sildenafil (VIAGRA) 100 MG tablet, Take 1 tablet (100 mg total) by mouth daily as needed for erectile dysfunction., Disp: 30 tablet, Rfl: 6   tamsulosin (FLOMAX) 0.4 MG CAPS capsule, Take 1 capsule (0.4 mg total) by mouth daily., Disp: 90 capsule, Rfl: 3   WEGOVY 1 MG/0.5ML SOAJ, Inject 1 mg into the skin once a week., Disp: , Rfl:  Labs and Diagnostic Imaging   CBC:  Recent Labs  Lab 04/14/23 1047 04/15/23 0710   WBC 6.2 9.5  NEUTROABS 3.8  --   HGB 12.0* 11.1*  HCT 36.9* 33.6*  MCV 94.6 93.9  PLT 368 396    Basic Metabolic Panel:  Lab Results  Component Value Date   NA 141 04/15/2023   K 3.5 04/15/2023   CO2 22 04/15/2023   GLUCOSE 88 04/15/2023   BUN 20 04/15/2023   CREATININE 1.68 (H) 04/15/2023   CALCIUM 9.2 04/15/2023   GFRNONAA 47 (L) 04/15/2023   GFRAA 54 (L) 04/18/2019   Lipid Panel:  Lab Results  Component Value Date   LDLCALC 66 08/12/2022   HgbA1c:  Lab Results  Component Value Date   HGBA1C 5.4 08/12/2022   Urine Drug Screen:     Component Value Date/Time   LABOPIA NONE DETECTED 04/14/2023 1441   LABOPIA NONE DETECTED 08/01/2022 1729   COCAINSCRNUR NONE DETECTED 04/14/2023 1441   LABBENZ NONE DETECTED 04/14/2023 1441   LABBENZ POSITIVE (A) 08/01/2022 1729  AMPHETMU NONE DETECTED 04/14/2023 1441   AMPHETMU NONE DETECTED 08/01/2022 1729   THCU NONE DETECTED 04/14/2023 1441   THCU NONE DETECTED 08/01/2022 1729   LABBARB NONE DETECTED 04/14/2023 1441   LABBARB NONE DETECTED 08/01/2022 1729    Alcohol Level     Component Value Date/Time   ETH <10 04/14/2023 2038   INR  Lab Results  Component Value Date   INR 0.89 05/01/2009   APTT  Lab Results  Component Value Date   APTT 25 05/01/2009   CT Head without contrast(Personally reviewed): Unremarkable  rEEG: 04/14/2023 Continuous generalized slowing suggestive of moderate to severe diffuse encephalopathy.  No seizures or epileptiform discharges seen.  Assessment   Albert [name] is a 58 y.o. male who has a significant past history of ADD, anemia, anxiety, bipolar disorder, depression, CKD 3B, Graves' disease, hypertension, hypothyroidism, presented for evaluation of seizure activity at home and had another seizure in the hospital.  He had a prolonged period of lethargy prior to coming around.  He still is somewhat tired but is participating in the exam and the exam is essentially nonfocal other than mild  lethargy. No concerning findings on the EEG done yesterday No fever or white count-low suspicion for CNS infection Some changes made to his psych medication/psychiatry consultation has been requested for medication optimization Started on Keppra which I think should be continued for a shorter term at least until he gets further evaluation as an outpatient and then a decision can be made whether he needs it in the longer term/lifelong versus only for the short-term.  Impression: New onset seizures  Recommendations  Continue Keppra 500 twice daily for now Maintain seizure precautions Ativan 2 mg IV for any seizure greater than 5 minutes I have reached out to the outpatient imaging center but have not seen any images yet-will try to reach back out again to see if they can PowerShare the images if the study is not ready yet. Appreciate psychiatry consultation for optimization of his psychotropic medications. Had a lengthy discussion with mother and father at bedside regarding his current clinical course.  There is no clear evident trigger for the seizures and he may need repeat EEG is as an outpatient and I would recommend that he follow-up with Dr. Sherryll Burger at [facility] clinic neurology where he has established care.  I will forward this note to Dr. Sherryll Burger as well.  Follow-up with outpatient neurology in 2 to 4 weeks-she has an appointment with Dr. Sherryll Burger at [facility] clinic neurology-recommend he keep that appointment and have Dr. Sherryll Burger evaluate him  Plan was discussed with Dr. Chipper Herb I will follow-up if the MRI images are available and if they are of any concern.  -- Signed, Milon Dikes, MD [facility]   SEIZURE PRECAUTIONS Per [facility] statutes, patients with seizures are not allowed to drive until they have been seizure-free for six months.   Use caution when using heavy equipment or power tools. Avoid working on ladders or at heights. Take showers instead of baths. Ensure  the water temperature is not too high on the home water heater. Do not go swimming alone. Do not lock yourself in a room alone (i.e. bathroom). When caring for infants or small children, sit down when holding, feeding, or changing them to minimize risk of injury to the child in the event you have a seizure. Maintain good sleep hygiene. Avoid alcohol.    If patient has another seizure, call 911 and bring them back to  the ED if: A.  The seizure lasts longer than 5 minutes.      B.  The patient doesn't wake shortly after the seizure or has new problems such as difficulty seeing, speaking or moving following the seizure C.  The patient was injured during the seizure D.  The patient has a temperature over 102 F (39C) E.  The patient vomited during the seizure and now is having trouble breathing This was discussed with patient, mother and father at bedside-they verbalized understanding.

## 2023-04-15 NOTE — Evaluation (Signed)
Physical Therapy Evaluation Patient Details Name: Albert [name] MRN: [id number] DOB: Oct 10, 1965 Today's Date: 04/15/2023  History of Present Illness  Pt is a 57 y.o. 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 who presented with  seizures (new onset).   Clinical Impression  Pt alert, oriented to self, place, situation but did exhibit some decreased safety awareness and impulsivity. Provided his PLOF, but per family after session he is living with them in a one story home, normally independent. He was able to perform supine <> sit a couple of times, modI. Sit <> Stand twice, with CGA no AD. Initially dizzy but improved over time (vitals WFLs). He ambulated ~34ft with CGA.  Overall the patient demonstrated deficits (see "PT Problem List") that impede the patient's functional abilities, safety, and mobility and would benefit from skilled PT intervention.  Pt did also endorse having homicidal thoughts and suicidal thoughts every other day at home. RN and MD made aware immediately.         If plan is discharge home, recommend the following: Assistance with cooking/housework;Supervision due to cognitive status;Direct supervision/assist for financial management;Assist for transportation;Help with stairs or ramp for entrance;Direct supervision/assist for medications management   Can travel by private vehicle        Equipment Recommendations None recommended by PT  Recommendations for Other Services        Functional Status Assessment Patient has had a recent decline in their functional status and demonstrates the ability to make significant improvements in function in a reasonable and predictable amount of time.     Precautions / Restrictions Precautions Precautions: Fall Restrictions Weight Bearing Restrictions Per Provider Order: No      Mobility  Bed Mobility Overal bed mobility: Needs Assistance Bed Mobility: Supine to Sit, Sit to Supine     Supine to sit: Modified independent (Device/Increase time) Sit to supine: Modified independent (Device/Increase time)   General bed mobility comments: impulsive    Transfers Overall transfer level: Needs assistance Equipment used: None Transfers: Sit to/from Stand Sit to Stand: Contact guard assist                Ambulation/Gait Ambulation/Gait assistance: Contact guard assist Gait Distance (Feet): 30 Feet Assistive device: None         General Gait Details: pt dizzy initially but with second attempt able to ambulate ~82ft. did exhibit some impulsivity (moved quickly/turned quickly, tapped the door with his foot)  Stairs            Wheelchair Mobility     Tilt Bed    Modified Rankin (Stroke Patients Only)       Balance Overall balance assessment: Needs assistance Sitting-balance support: Feet supported Sitting balance-Leahy Scale: Good       Standing balance-Leahy Scale: Good                               Pertinent Vitals/Pain Pain Assessment Pain Assessment: No/denies pain    Home Living Family/patient expects to be discharged to:: Private residence Living Arrangements: Parent Available Help at Discharge: Family Type of Home: House  Home Access: Stairs to enter Entrance Stairs-Rails: Can reach both Entrance Stairs-Number of Steps: 3-4   Home Layout: One level Home Equipment: None      Prior Function Prior Level of Function : Independent/Modified Independent              Mobility Comments: living with his parents the last 6-8 months but does go back and forth to his house       Extremity/Trunk Assessment   Upper Extremity Assessment Upper Extremity Assessment: Overall WFL for tasks assessed    Lower Extremity Assessment Lower Extremity Assessment: Overall WFL for tasks assessed       Communication      Cognition Arousal: Alert Behavior During Therapy: WFL for tasks assessed/performed Overall Cognitive Status: Within Functional Limits for tasks assessed                                          General Comments      Exercises     Assessment/Plan    PT Assessment Patient needs continued PT services  PT Problem List Decreased strength;Decreased balance;Decreased mobility;Decreased safety awareness       PT Treatment Interventions DME instruction;Neuromuscular re-education;Stair training;Functional mobility training;Therapeutic activities;Therapeutic exercise;Balance training    PT Goals (Current goals can be found in the Care Plan section)  Acute Rehab PT Goals Patient Stated Goal: to go home PT Goal Formulation: With patient Time For Goal Achievement: 04/29/23 Potential to Achieve Goals: Good    Frequency Min 1X/week     Co-evaluation               AM-PAC PT "6 Clicks" Mobility  Outcome Measure Help needed turning from your back to your side while in a flat bed without using bedrails?: None Help needed moving from lying on your back to sitting on the side of a flat bed without using bedrails?: None Help needed moving to and from a bed to a chair (including a wheelchair)?: None Help needed standing up from a chair using your arms (e.g., wheelchair or bedside chair)?: None Help needed to walk in hospital room?: A Little Help needed climbing 3-5 steps with a railing? : A Little 6 Click Score: 22    End of Session Equipment Utilized During Treatment: Gait belt Activity Tolerance: Patient tolerated  treatment well Patient left: with call bell/phone within reach;in bed;with family/visitor present Nurse Communication: Mobility status PT Visit Diagnosis: Other abnormalities of gait and mobility (R26.89);Difficulty in walking, not elsewhere classified (R26.2);Muscle weakness (generalized) (M62.81)    Time: 9528-4132 PT Time Calculation (min) (ACUTE ONLY): 22 min   Charges:   PT Evaluation $PT Eval Low Complexity: 1 Low PT Treatments $Therapeutic Activity: 8-22 mins PT General Charges $$ ACUTE PT VISIT: 1 Visit        Olga Coaster PT, DPT 3:44 PM,04/15/23

## 2023-04-15 NOTE — Progress Notes (Signed)
Same-day progress note Received report of the MRI scan from 04/13/2023 from [place].  Normal MRI reported. No further inpatient workup from a neurological standpoint Awaiting psychiatry evaluation Follow-up with Dr. Sherryll Burger at [facility] as recommended earlier Please call with questions as needed   -- Milon Dikes, MD Neurologist [facility]

## 2023-04-15 NOTE — Consult Note (Signed)
Pharmacy - Clozapine     This patient's order has been reviewed for prescribing contraindications.   Labs:  ANC 3.8 K/uL   Reported to [facility] program 04/15/2023   Evelene Roussin S Jerney Baksh,PharmD, BCPS

## 2023-04-15 NOTE — ED Notes (Signed)
Hospitalist messaged due to family and pt being concerned about pt other home medications

## 2023-04-15 NOTE — ED Notes (Signed)
PT at bedside.

## 2023-04-15 NOTE — ED Notes (Signed)
Pt given 2 more orange juices and phone to call family

## 2023-04-16 DIAGNOSIS — R569 Unspecified convulsions: Secondary | ICD-10-CM | POA: Diagnosis not present

## 2023-04-16 DIAGNOSIS — F319 Bipolar disorder, unspecified: Secondary | ICD-10-CM

## 2023-04-16 DIAGNOSIS — F314 Bipolar disorder, current episode depressed, severe, without psychotic features: Secondary | ICD-10-CM

## 2023-04-16 DIAGNOSIS — R45851 Suicidal ideations: Secondary | ICD-10-CM

## 2023-04-16 LAB — GLUCOSE, CAPILLARY: Glucose-Capillary: 102 mg/dL — ABNORMAL HIGH (ref 70–99)

## 2023-04-16 MED ORDER — CLOZAPINE 100 MG PO TABS
300.0000 mg | ORAL_TABLET | Freq: Every day | ORAL | Status: DC
Start: 2023-04-16 — End: 2023-04-17
  Administered 2023-04-16: 300 mg via ORAL
  Filled 2023-04-16 (×2): qty 3

## 2023-04-16 MED ORDER — LEVETIRACETAM 500 MG PO TABS
500.0000 mg | ORAL_TABLET | Freq: Two times a day (BID) | ORAL | Status: DC
  Administered 2023-04-16 – 2023-04-17 (×3): 500 mg via ORAL
  Filled 2023-04-16 (×3): qty 1

## 2023-04-16 NOTE — Consult Note (Addendum)
[facility] Face-to-Face Psychiatry Consult   Reason for Consult:  SI Referring Physician:  Marrion Coy, MD  Patient Identification: Albert [name] MRN:  [id number] Principal Diagnosis: Syncope Diagnosis:  Principal Problem:   Syncope Active Problems:   Severe depressed bipolar I disorder without psychotic features (HCC)   GAD (generalized anxiety disorder)   Benign hypertensive kidney disease with chronic kidney disease   Essential hypertriglyceridemia   Gastroesophageal reflux disease without esophagitis   Secondary hyperparathyroidism of renal origin (HCC)   CKD stage 3a, GFR 45-59 ml/min (HCC)   Seizure (HCC)   Bipolar 1 disorder (HCC)   Lactic acid acidosis   Constipation   Suicide ideation   Subjective:    Albert [name] is a 57 year old male with history of B12 deficiency, 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  Today patient was seen in the ER bed 37.  Patient was seen for assessment of suicide ideation.  Today patient endorsed depressed and sad mood.  He reports he has been suicidal since age 83.  He said it" it comes and goes".  Patient reports that he has lots of reason to live.  He said" I have a family, I love my parents, I love my brother sisters nieces and nephews".  Patient denies past history of suicide attempt.  Patient denies any access to guns.  He reports that he takes Risperdal and Luvox for bipolar disorder and OCD.  Although patient reports he is not sure if he feels safe going back home.  Patient was provided with support and reassurance.  Patient agrees to inpatient psych treatment.  This was discussed in  detail with patient's parents, parents agree with the plan.   Past Psychiatric History: Patient reports history of bipolar disorder and OCD.  Patient denies past history of cardiac.  Patient reports he has been on Risperdal, lithium, Clozaril, and Luvox.  He follows with a psychiatrist at [facility] in [place]  Risk to Self:  Yes Risk to Others:  No Prior Inpatient Therapy: Yes  Prior Outpatient Therapy:  Yes  Past Medical History:  Past Medical History:  Diagnosis Date   ADHD (attention deficit hyperactivity disorder)    Anemia    Anxiety    Bipolar disorder (HCC)    BPH (benign prostatic hyperplasia)    Chronic kidney disease, stage 3b (HCC)    Constipation    DDD (degenerative disc disease), cervical    Depression    Deviated septum    ED (erectile dysfunction)    GERD (gastroesophageal reflux disease)    Graves disease    History of hiatal hernia    Hypertension    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    Hypothyroidism    Pneumonia    PONV (postoperative nausea and vomiting)     Past Surgical History:  Procedure Laterality Date   BACK SURGERY     lumbar disc   COLONOSCOPY N/A 12/23/2022   Procedure: COLONOSCOPY;  Surgeon: Jaynie Collins, DO;  Location: [facility] ENDOSCOPY;  Service: Gastroenterology;  Laterality: N/A;   colonoscopyx2 N/A    2003, 2016   ESOPHAGOGASTRODUODENOSCOPY     1998, 2002,  2003, 2016   ESOPHAGOGASTRODUODENOSCOPY (EGD) WITH PROPOFOL N/A 09/12/2020   Procedure: ESOPHAGOGASTRODUODENOSCOPY (EGD) WITH PROPOFOL;  Surgeon: Earline Mayotte, MD;  Location: [facility] ENDOSCOPY;  Service: Endoscopy;  Laterality: N/A;  1ST CASE   EYE SURGERY  2006   strabismus surgery   HERNIA REPAIR     Hiatal Hernia   INSERTION OF MESH Bilateral 03/03/2021   Procedure: INSERTION OF MESH;  Surgeon: Leafy Ro, MD;  Location: [facility] ORS;  Service: General;  Laterality: Bilateral;   NASAL SEPTUM SURGERY     NM I- 131 THERAPY FOR  ABLATION ([facility] HX)  04/11/2015   thyroid   SPINE SURGERY     XI ROBOTIC ASSISTED PARAESOPHAGEAL HERNIA REPAIR N/A 10/23/2020   Procedure: XI ROBOTIC ASSISTED PARAESOPHAGEAL HERNIA REPAIR WITH MESH AND INCISIONAL HERNIA REPAIR WITH MESH;  Surgeon: Leafy Ro, MD;  Location: [facility] ORS;  Service: General;  Laterality: N/A;   Family History:  Family History  Problem Relation Age of Onset   Prostate cancer Neg Hx    Bladder Cancer Neg Hx    Kidney cancer Neg Hx     Social History:  Social History   Substance and Sexual Activity  Alcohol Use Not Currently     Social History   Substance and Sexual Activity  Drug Use Never    Social History   Socioeconomic History   Marital status: Single    Spouse name: Not on file   Number of children: Not on file   Years of education: Not on file   Highest education level: Not on file  Occupational History   Not on file  Tobacco Use   Smoking status: Former    Types: Cigarettes    Passive exposure: Past   Smokeless tobacco: Former    Types: Engineer, drilling   Vaping status: Never Used  Substance and Sexual Activity   Alcohol use: Not Currently   Drug use: Never   Sexual activity: Yes  Other Topics Concern   Not on file  Social History Narrative   Not on file   Social Drivers of Health   Financial Resource Strain: Low Risk  (11/03/2022)   Received from [facility], [facility]   Overall Financial Resource Strain (CARDIA)    Difficulty of Paying Living Expenses: Not very hard  Food Insecurity: No Food Insecurity (11/03/2022)   Received from [facility], [facility]   Hunger Vital Sign    Worried About Running Out of Food in the Last Year: Never true    Ran Out of Food in the Last Year: Never true  Transportation Needs: No Transportation Needs (11/03/2022)   Received from [facility], [facility]   PRAPARE - Transportation     In the past 12 months, has lack of transportation kept you from medical appointments or from getting medications?: No    Lack of Transportation (Non-Medical): No  Physical Activity: Not on file  Stress: Stress Concern Present (08/12/2020)   Received from [facility], [facility] - Occupational Stress Questionnaire    Feeling of Stress : Very much  Social Connections: Unknown (09/01/2021)   Received from [facility], [facility]   Social Network    Social Network: Not on file   Additional Social History:  Patient reports he lives by himself in [place].  He reports his bearings about 3 to 4 miles from his  residence.  He reports he has good support from his parents.  Allergies:   Allergies  Allergen Reactions   Meloxicam Other (See Comments)    Dizziness   Nsaids Other (See Comments)    Hallucinations   Ibuprofen Other (See Comments)    Can not take because taking lithium   Prednisone Other (See Comments)    Can't sleep    Wellbutrin [Bupropion] Other (See Comments)    Suicidal thoughts    Labs:  Results for orders placed or performed during the hospital encounter of 04/14/23 (from the past 48 hours)  Lactic acid, plasma     Status: None   Collection Time: 04/14/23 12:47 PM  Result Value Ref Range   Lactic Acid, Venous 1.9 0.5 - 1.9 mmol/L    Comment: Performed at [facility], [address] [place], [place] [address]  Troponin I (High Sensitivity)     Status: None   Collection Time: 04/14/23 12:47 PM  Result Value Ref Range   Troponin I (High Sensitivity) 3 <18 ng/L    Comment: (NOTE) Elevated high sensitivity troponin I (hsTnI) values and significant  changes across serial measurements may suggest ACS but many other  chronic and acute conditions are known to elevate hsTnI results.  Refer to the "Links" section for chest pain algorithms and additional  guidance. Performed at [facility], [address] [place], [place] [address]   Lithium level     Status: Abnormal   Collection Time: 04/14/23 12:47 PM  Result Value Ref Range   Lithium Lvl 0.20 (L) 0.60 - 1.20 mmol/L    Comment: Performed at [facility], [address] [place], [place] [address]  Urine Drug Screen, Qualitative     Status: Abnormal   Collection Time: 04/14/23  2:41 PM  Result Value Ref Range   Tricyclic, Ur Screen POSITIVE (A) NONE DETECTED   Amphetamines, Ur Screen NONE DETECTED NONE DETECTED   MDMA (Ecstasy)Ur Screen POSITIVE (A) NONE DETECTED   Cocaine Metabolite,Ur [place] NONE DETECTED NONE DETECTED   Opiate, Ur Screen NONE DETECTED NONE DETECTED   Phencyclidine (PCP) Ur S NONE DETECTED NONE DETECTED   Cannabinoid 50 Ng, Ur [place] NONE DETECTED NONE DETECTED   Barbiturates, Ur Screen NONE DETECTED NONE DETECTED   Benzodiazepine, Ur Scrn NONE DETECTED NONE DETECTED   Methadone Scn, Ur NONE DETECTED NONE DETECTED    Comment: (NOTE) Tricyclics + metabolites, urine    Cutoff 1000 ng/mL Amphetamines + metabolites, urine  Cutoff 1000 ng/mL MDMA (Ecstasy), urine              Cutoff 500 ng/mL Cocaine Metabolite, urine          Cutoff 300 ng/mL Opiate + metabolites, urine        Cutoff 300 ng/mL Phencyclidine (PCP), urine         Cutoff 25 ng/mL Cannabinoid, urine                 Cutoff 50 ng/mL Barbiturates + metabolites, urine  Cutoff 200 ng/mL Benzodiazepine, urine              Cutoff 200 ng/mL Methadone, urine                   Cutoff 300 ng/mL  The urine drug screen provides only a preliminary, unconfirmed analytical test result and should not be used for non-medical purposes. Clinical consideration and professional judgment should be applied to any positive drug screen result due to  possible interfering substances. A more specific alternate chemical method must be used in order to obtain a confirmed analytical result. Gas chromatography / mass spectrometry (GC/MS) is the preferred confirm  atory method. Performed at [facility], [address] [place], [place] [address]   Urinalysis, w/ Reflex to Culture (Infection Suspected) -Urine, Clean Catch     Status: Abnormal   Collection Time: 04/14/23  2:41 PM  Result Value Ref Range   Specimen Source URINE, CLEAN CATCH    Color, Urine YELLOW (A) YELLOW   APPearance CLEAR (A) CLEAR   Specific Gravity, Urine 1.009 1.005 - 1.030   pH 7.0 5.0 - 8.0   Glucose, UA NEGATIVE NEGATIVE mg/dL   Hgb urine dipstick NEGATIVE NEGATIVE   Bilirubin Urine NEGATIVE NEGATIVE   Ketones, ur NEGATIVE NEGATIVE mg/dL   Protein, ur NEGATIVE NEGATIVE mg/dL   Nitrite NEGATIVE NEGATIVE   Leukocytes,Ua NEGATIVE NEGATIVE   RBC / HPF 0-5 0 - 5 RBC/hpf   WBC, UA 0-5 0 - 5 WBC/hpf    Comment:        Reflex urine culture not performed if WBC <=10, OR if Squamous epithelial cells >5. If Squamous epithelial cells >5 suggest recollection.    Bacteria, UA NONE SEEN NONE SEEN   Squamous Epithelial / HPF 0 0 - 5 /HPF   Mucus PRESENT     Comment: Performed at [facility], [address] [place], [place] [address]  Procalcitonin     Status: None   Collection Time: 04/14/23  7:06 PM  Result Value Ref Range   Procalcitonin <0.10 ng/mL    Comment:        Interpretation: PCT (Procalcitonin) <= 0.5 ng/mL: Systemic infection (sepsis) is not likely. Local bacterial infection is possible. (NOTE)       Sepsis PCT Algorithm           Lower Respiratory Tract                                      Infection PCT Algorithm    ----------------------------     ----------------------------         PCT < 0.25 ng/mL                PCT < 0.10 ng/mL          Strongly encourage             Strongly discourage   discontinuation of antibiotics    initiation of antibiotics    ----------------------------     -----------------------------       PCT 0.25 - 0.50 ng/mL            PCT 0.10 - 0.25 ng/mL               OR       >80% decrease in PCT             Discourage initiation of                                            antibiotics      Encourage discontinuation           of antibiotics    ----------------------------     -----------------------------         PCT >=  0.50 ng/mL              PCT 0.26 - 0.50 ng/mL               AND        <80% decrease in PCT             Encourage initiation of                                             antibiotics       Encourage continuation           of antibiotics    ----------------------------     -----------------------------        PCT >= 0.50 ng/mL                  PCT > 0.50 ng/mL               AND         increase in PCT                  Strongly encourage                                      initiation of antibiotics    Strongly encourage escalation           of antibiotics                                     -----------------------------                                           PCT <= 0.25 ng/mL                                                 OR                                        > 80% decrease in PCT                                      Discontinue / Do not initiate                                             antibiotics  Performed at [facility], [address] [place], [place] [address]   Magnesium     Status: None   Collection Time: 04/14/23  8:38 PM  Result Value Ref Range   Magnesium 2.3 1.7 - 2.4 mg/dL    Comment: Performed at [facility], [address] [place], [place] [address]  Ethanol     Status: None  Collection Time: 04/14/23  8:38 PM  Result Value Ref Range   Alcohol, Ethyl (B) <10 <10 mg/dL    Comment: (NOTE) Lowest detectable limit for serum alcohol is 10 mg/dL.  For medical purposes only. Performed at [facility], [address] [place], [place] [address]   Basic metabolic panel     Status: Abnormal   Collection Time: 04/15/23  7:10 AM  Result Value Ref Range   Sodium 141 135 - 145 mmol/L   Potassium 3.5 3.5 - 5.1  mmol/L   Chloride 115 (H) 98 - 111 mmol/L   CO2 22 22 - 32 mmol/L   Glucose, Bld 88 70 - 99 mg/dL    Comment: Glucose reference range applies only to samples taken after fasting for at least 8 hours.   BUN 20 6 - 20 mg/dL   Creatinine, Ser 3.08 (H) 0.61 - 1.24 mg/dL   Calcium 9.2 8.9 - 65.7 mg/dL   GFR, Estimated 47 (L) >60 mL/min    Comment: (NOTE) Calculated using the CKD-EPI Creatinine Equation (2021)    Anion gap 8 5 - 15    Comment: Performed at [facility], [address] [place], [place] [address]  CBC     Status: Abnormal   Collection Time: 04/15/23  7:10 AM  Result Value Ref Range   WBC 9.5 4.0 - 10.5 K/uL   RBC 3.58 (L) 4.22 - 5.81 MIL/uL   Hemoglobin 11.1 (L) 13.0 - 17.0 g/dL   HCT 29.5 (L) 28.4 - 13.2 %   MCV 93.9 80.0 - 100.0 fL   MCH 31.0 26.0 - 34.0 pg   MCHC 33.0 30.0 - 36.0 g/dL   RDW 44.0 10.2 - 72.5 %   Platelets 396 150 - 400 K/uL   nRBC 0.0 0.0 - 0.2 %    Comment: Performed at [facility], [address], [place], [place] [address]    Current Facility-Administered Medications  Medication Dose Route Frequency Provider Last Rate Last Admin   acetaminophen (TYLENOL) tablet 650 mg  650 mg Oral Q6H PRN Cox, Amy N, DO       Or   acetaminophen (TYLENOL) suppository 650 mg  650 mg Rectal Q6H PRN Cox, Amy N, DO       cloZAPine (CLOZARIL) tablet 500 mg  500 mg Oral QHS Marrion Coy, MD   500 mg at 04/15/23 2113   fenofibrate tablet 54 mg  54 mg Oral Daily Marrion Coy, MD   54 mg at 04/16/23 0934   fluvoxaMINE (LUVOX) tablet 50 mg  50 mg Oral QHS Marrion Coy, MD   50 mg at 04/15/23 2113   heparin injection 5,000 Units  5,000 Units Subcutaneous Q8H Cox, Amy N, DO   5,000 Units at 04/16/23 0347   hydrALAZINE (APRESOLINE) injection 5 mg  5 mg Intravenous Q6H PRN Cox, Amy N, DO       levETIRAcetam (KEPPRA) tablet 500 mg  500 mg Oral BID Marrion Coy, MD       levothyroxine (SYNTHROID) tablet 75 mcg  75 mcg Oral QAC breakfast Cox, Amy N, DO   75  mcg at 04/16/23 4259   lithium carbonate capsule 150 mg  150 mg Oral QHS Marrion Coy, MD   150 mg at 04/15/23 2113   LORazepam (ATIVAN) injection 0.5 mg  0.5 mg Intravenous QID Cox, Amy N, DO   0.5 mg at 04/16/23 0934   LORazepam (ATIVAN) injection 2 mg  2 mg Intravenous PRN Cox, Amy N,  DO       ondansetron (ZOFRAN) tablet 4 mg  4 mg Oral Q6H PRN Cox, Amy N, DO       Or   ondansetron (ZOFRAN) injection 4 mg  4 mg Intravenous Q6H PRN Cox, Amy N, DO       polyethylene glycol (MIRALAX / GLYCOLAX) packet 17 g  17 g Oral BID Cox, Amy N, DO   17 g at 04/16/23 0934   risperiDONE (RISPERDAL) tablet 2 mg  2 mg Oral Nightly Marrion Coy, MD   2 mg at 04/15/23 2113   senna-docusate (Senokot-S) tablet 1 tablet  1 tablet Oral QHS PRN Cox, Amy N, DO       sodium chloride flush (NS) 0.9 % injection 3 mL  3 mL Intravenous Q12H Cox, Amy N, DO   3 mL at 04/16/23 0934   tamsulosin (FLOMAX) capsule 0.4 mg  0.4 mg Oral Daily Cox, Amy N, DO   0.4 mg at 04/16/23 4098   Current Outpatient Medications  Medication Sig Dispense Refill   acetaminophen (TYLENOL) 500 MG tablet Take 1,000 mg by mouth every 6 (six) hours as needed for moderate pain.     atropine 1 % ophthalmic solution Place 2 drops under the tongue 3 (three) times daily as needed. 2 mL 1   cholecalciferol (VITAMIN D3) 25 MCG (1000 UNIT) tablet Take 1,000 Units by mouth daily.     cloZAPine (CLOZARIL) 100 MG tablet Take 5 tablets (500 mg total) by mouth at bedtime. 75 tablet 2   Cyanocobalamin (VITAMIN B-12 IJ) Inject as directed every 30 (thirty) days.     esomeprazole (NEXIUM) 40 MG capsule Take 40 mg by mouth daily.     fenofibrate (TRICOR) 145 MG tablet Take 145 mg by mouth daily.     fluvoxaMINE (LUVOX) 100 MG tablet 1/2 tablet in the morning for 1 week, then 1 tablet each morning 30 tablet 0   levothyroxine (SYNTHROID, LEVOTHROID) 75 MCG tablet Take 75 mcg by mouth daily before breakfast.     lithium carbonate 150 MG capsule TAKE 1 CAPSULE BY MOUTH  AT BEDTIME 30 capsule 0   LORazepam (ATIVAN) 0.5 MG tablet TAKE ONE TABLET BY MOUTH FOUR TIMES DAILY 120 tablet 0   lubiprostone (AMITIZA) 24 MCG capsule Take 24 mcg by mouth 2 (two) times daily with a meal.     metFORMIN (GLUCOPHAGE-XR) 500 MG 24 hr tablet Take 500 mg by mouth 2 (two) times daily with a meal.     risperidone (RISPERDAL) 4 MG tablet TAKE 1 TABLET BY MOUTH TWICE DAILY (Patient taking differently: Take 4 mg by mouth daily.) 60 tablet 0   sildenafil (VIAGRA) 100 MG tablet Take 1 tablet (100 mg total) by mouth daily as needed for erectile dysfunction. 30 tablet 6   tamsulosin (FLOMAX) 0.4 MG CAPS capsule Take 1 capsule (0.4 mg total) by mouth daily. 90 capsule 3    Musculoskeletal: Strength & Muscle Tone: within normal limits Gait & Station:  Pt in bed Patient leans: N/A     Psychiatric Specialty Exam:  Presentation  General Appearance:  Appropriate for Environment  Eye Contact: Fair  Speech: Clear and Coherent; Slow  Speech Volume: Decreased  Handedness: Right   Mood and Affect  Mood: Depressed  Affect: Congruent; Constricted   Thought Process  Thought Processes: Goal Directed  Descriptions of Associations:Intact  Orientation:Full (Time, Place and Person)  Thought Content:Abstract Reasoning  History of Schizophrenia/Schizoaffective disorder:No  Duration of Psychotic Symptoms: NA  Hallucinations:Hallucinations:  None  Ideas of Reference:None  Suicidal Thoughts:Suicidal Thoughts: Yes, Passive SI Passive Intent and/or Plan: Without Plan  Homicidal Thoughts:Homicidal Thoughts: No   Sensorium  Memory: Immediate Fair; Recent Fair  Judgment: Fair  Insight: Fair   Art therapist  Concentration: Fair  Attention Span: Poor  Recall: Good  Fund of Knowledge: Fair  Language: Fair   Psychomotor Activity  Psychomotor Activity: Psychomotor Activity: Decreased   Assets  Assets: Communication Skills; Desire for  Improvement; Housing; Social Support; Resilience   Sleep  Sleep: Sleep: Fair   Physical Exam: Physical Exam Constitutional:      Appearance: Normal appearance.  HENT:     Head: Normocephalic and atraumatic.  Cardiovascular:     Rate and Rhythm: Regular rhythm.  Pulmonary:     Effort: Pulmonary effort is normal.  Skin:    General: Skin is warm.  Neurological:     Mental Status: He is alert and oriented to person, place, and time.    Review of Systems  Constitutional:  Negative for chills and fever.  HENT:  Negative for hearing loss and sore throat.   Eyes:  Negative for blurred vision.  Respiratory:  Negative for cough and shortness of breath.   Cardiovascular:  Negative for chest pain and palpitations.  Gastrointestinal:  Negative for nausea and vomiting.  Neurological:  Negative for speech change and headaches.  Psychiatric/Behavioral:  Positive for depression and suicidal ideas.    Blood pressure 124/79, pulse 89, temperature 97.8 F (36.6 C), temperature source Oral, resp. rate 16, SpO2 100%. There is no height or weight on file to calculate BMI.  Treatment Plan Summary: Patient is a 57 year old male who is admitted secondary to new onset seizures and syncope.  Today patient endorsed depressed mood with suicidal ideations, denies any intention or plan to harm himself.  Patient is not sure if he will be safe going back home, he agrees to get inpatient psych treatment for med management and stabilization.  Parents were present during later of half of the assessment and agrees with the plan.   Would recommend inpatient psychiatry treatment upon medical clearance.  Psychiatry consult service will sign off at this time.  Please call back if further assistance is needed.  Lewanda Rife, MD  Will recommend consider tapering off Clozaril as it increases the risk of Seizures. D/w Dr. Chipper Herb

## 2023-04-16 NOTE — Progress Notes (Signed)
  Progress Note   Patient: Albert [name] UXN:[id number] DOB: Oct 30, 1965 DOA: 04/14/2023     2 DOS: the patient was seen and examined on 04/16/2023   Brief hospital course: 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 Patient is seen by neurology, started on Keppra.  Patient also has suicide ideation, psychiatry consult obtained.  MRI brain from outside hospital was normal in report.   Principal Problem:   Syncope Active Problems:   Severe depressed bipolar I disorder without psychotic features (HCC)   GAD (generalized anxiety disorder)   Benign hypertensive kidney disease with chronic kidney disease   Essential hypertriglyceridemia   Gastroesophageal reflux disease without esophagitis   Secondary hyperparathyroidism of renal origin (HCC)   CKD stage 3a, GFR 45-59 ml/min (HCC)   Seizure (HCC)   Bipolar 1 disorder (HCC)   Lactic acid acidosis   Constipation   Suicide ideation   Bipolar disorder, current episode depressed, severe, without psychotic features (HCC)   New onset seizure (HCC)   Assessment and Plan: *New seizure. Patient does seem to have prolonged confusion after seizure episode, EEG showed encephalopathy.  Patient has been seen by neurology, brain MRI performed at outside hospital was normal. Patient is started on Keppra, changed to oral. Condition has been stable.    Suicidal ideation. Patient voiced suicidal ideation, has been seen by psychiatry, recommend psychiatry admission.  Currently pending bed.  Patient medically stable for transfer.   Constipation MiraLAX twice daily resumed   Lactic acid acidosis Resolved, secondary to seizure.   Bipolar 1 disorder [facility]) Psychiatry recommended taping of clozapine due to increased seizure risk.  I will reduce the dose from 500 to 300  mg daily and gradually taper off.     CKD stage 3a, GFR 45-59 ml/min (HCC) Continue to follow.   GAD (generalized anxiety disorder) Home Ativan 0.5 mg 4 times daily converted to IV dosing resumed on admission        Subjective:  Patient doing well, no additional seizure.  Physical Exam: Vitals:   04/16/23 0626 04/16/23 0931 04/16/23 1000 04/16/23 1200  BP: 136/89 124/79 125/86 (!) 130/100  Pulse: 83 89 86 91  Resp: 18 16 17 18   Temp: 98.1 F (36.7 C) 97.8 F (36.6 C)    TempSrc: Oral Oral    SpO2: 100% 100% 100% 100%   General exam: Appears calm and comfortable  Respiratory system: Clear to auscultation. Respiratory effort normal. Cardiovascular system: S1 & S2 heard, RRR. No JVD, murmurs, rubs, gallops or clicks. No pedal edema. Gastrointestinal system: Abdomen is nondistended, soft and nontender. No organomegaly or masses felt. Normal bowel sounds heard. Central nervous system: Alert and oriented. No focal neurological deficits. Extremities: Symmetric 5 x 5 power. Skin: No rashes, lesions or ulcers Psychiatry: Judgement and insight appear normal. Mood & affect appropriate.    Data Reviewed:  Lab results reviewed.  Family Communication: None  Disposition: Status is: Inpatient Remains inpatient appropriate because: Unsafe discharge, pending psych transfer.     Time spent: 35 minutes  Author: Marrion Coy, MD 04/16/2023 1:50 PM  For on call review [email or web address].

## 2023-04-17 ENCOUNTER — Encounter: Payer: Self-pay | Admitting: Psychiatric/Mental Health

## 2023-04-17 ENCOUNTER — Other Ambulatory Visit: Payer: Self-pay

## 2023-04-17 ENCOUNTER — Inpatient Hospital Stay
Admission: RE | Admit: 2023-04-17 | Discharge: 2023-04-24 | DRG: 885 | Disposition: A | Discharge status: Home / Self Care | Payer: Medicare PPO | Source: Intra-hospital | Attending: Psychiatry | Admitting: Psychiatry

## 2023-04-17 DIAGNOSIS — F429 Obsessive-compulsive disorder, unspecified: Secondary | ICD-10-CM | POA: Diagnosis present

## 2023-04-17 DIAGNOSIS — N400 Enlarged prostate without lower urinary tract symptoms: Secondary | ICD-10-CM | POA: Diagnosis present

## 2023-04-17 DIAGNOSIS — I129 Hypertensive chronic kidney disease with stage 1 through stage 4 chronic kidney disease, or unspecified chronic kidney disease: Secondary | ICD-10-CM | POA: Diagnosis present

## 2023-04-17 DIAGNOSIS — N1832 Chronic kidney disease, stage 3b: Secondary | ICD-10-CM | POA: Diagnosis present

## 2023-04-17 DIAGNOSIS — R569 Unspecified convulsions: Secondary | ICD-10-CM | POA: Diagnosis not present

## 2023-04-17 DIAGNOSIS — F314 Bipolar disorder, current episode depressed, severe, without psychotic features: Secondary | ICD-10-CM | POA: Diagnosis present

## 2023-04-17 DIAGNOSIS — Z79890 Hormone replacement therapy (postmenopausal): Secondary | ICD-10-CM | POA: Diagnosis not present

## 2023-04-17 DIAGNOSIS — F419 Anxiety disorder, unspecified: Secondary | ICD-10-CM | POA: Diagnosis present

## 2023-04-17 DIAGNOSIS — F909 Attention-deficit hyperactivity disorder, unspecified type: Secondary | ICD-10-CM | POA: Diagnosis present

## 2023-04-17 DIAGNOSIS — Z7984 Long term (current) use of oral hypoglycemic drugs: Secondary | ICD-10-CM

## 2023-04-17 DIAGNOSIS — Z79899 Other long term (current) drug therapy: Secondary | ICD-10-CM | POA: Diagnosis not present

## 2023-04-17 DIAGNOSIS — F3130 Bipolar disorder, current episode depressed, mild or moderate severity, unspecified: Secondary | ICD-10-CM | POA: Diagnosis not present

## 2023-04-17 DIAGNOSIS — R45851 Suicidal ideations: Secondary | ICD-10-CM | POA: Diagnosis present

## 2023-04-17 DIAGNOSIS — K219 Gastro-esophageal reflux disease without esophagitis: Secondary | ICD-10-CM | POA: Diagnosis present

## 2023-04-17 DIAGNOSIS — Z8673 Personal history of transient ischemic attack (TIA), and cerebral infarction without residual deficits: Secondary | ICD-10-CM | POA: Diagnosis not present

## 2023-04-17 DIAGNOSIS — Z87891 Personal history of nicotine dependence: Secondary | ICD-10-CM | POA: Diagnosis not present

## 2023-04-17 DIAGNOSIS — F32A Depression, unspecified: Secondary | ICD-10-CM | POA: Diagnosis present

## 2023-04-17 LAB — BASIC METABOLIC PANEL
Anion gap: 7 (ref 5–15)
BUN: 16 mg/dL (ref 6–20)
CO2: 28 mmol/L (ref 22–32)
Calcium: 8.7 mg/dL — ABNORMAL LOW (ref 8.9–10.3)
Chloride: 109 mmol/L (ref 98–111)
Creatinine, Ser: 1.43 mg/dL — ABNORMAL HIGH (ref 0.61–1.24)
GFR, Estimated: 56 mL/min — ABNORMAL LOW (ref >60)
Glucose, Bld: 92 mg/dL (ref 70–99)
Potassium: 4.4 mmol/L (ref 3.5–5.1)
Sodium: 144 mmol/L (ref 135–145)

## 2023-04-17 LAB — VITAMIN B1: Vitamin B1 (Thiamine): 146.5 nmol/L (ref 66.5–200.0)

## 2023-04-17 LAB — GLUCOSE, CAPILLARY
Glucose-Capillary: 89 mg/dL (ref 70–99)
Glucose-Capillary: 149 mg/dL — ABNORMAL HIGH (ref 70–99)
Glucose-Capillary: 109 mg/dL — ABNORMAL HIGH (ref 70–99)
Glucose-Capillary: 130 mg/dL — ABNORMAL HIGH (ref 70–99)

## 2023-04-17 MED ORDER — LORAZEPAM 2 MG/ML IJ SOLN: 2.0000 mg | Freq: Three times a day (TID) | INTRAMUSCULAR | Status: DC | PRN

## 2023-04-17 MED ORDER — ALUM & MAG HYDROXIDE-SIMETH 200-200-20 MG/5ML PO SUSP: 30.0000 mL | ORAL | Status: DC | PRN

## 2023-04-17 MED ORDER — HYDROXYZINE HCL 25 MG PO TABS
25.0000 mg | ORAL_TABLET | Freq: Three times a day (TID) | ORAL | Status: DC | PRN
  Administered 2023-04-17 – 2023-04-22 (×7): 25 mg via ORAL
  Filled 2023-04-17 (×7): qty 1

## 2023-04-17 MED ORDER — CLOZAPINE 100 MG PO TABS: 300.0000 mg | ORAL_TABLET | Freq: Every day | ORAL | Status: DC

## 2023-04-17 MED ORDER — LEVETIRACETAM 500 MG PO TABS: 500.0000 mg | ORAL_TABLET | Freq: Two times a day (BID) | ORAL | 0 refills | Status: AC

## 2023-04-17 MED ORDER — HALOPERIDOL LACTATE 5 MG/ML IJ SOLN: 5.0000 mg | Freq: Three times a day (TID) | INTRAMUSCULAR | Status: DC | PRN

## 2023-04-17 MED ORDER — DIPHENHYDRAMINE HCL 50 MG/ML IJ SOLN: 50.0000 mg | Freq: Three times a day (TID) | INTRAMUSCULAR | Status: DC | PRN

## 2023-04-17 MED ORDER — ACETAMINOPHEN 325 MG PO TABS: 650.0000 mg | ORAL_TABLET | Freq: Four times a day (QID) | ORAL | Status: DC | PRN

## 2023-04-17 MED ORDER — DIPHENHYDRAMINE HCL 25 MG PO CAPS
50.0000 mg | ORAL_CAPSULE | Freq: Three times a day (TID) | ORAL | Status: DC | PRN
  Administered 2023-04-18 – 2023-04-22 (×3): 50 mg via ORAL
  Filled 2023-04-17 (×3): qty 2

## 2023-04-17 MED ORDER — MAGNESIUM HYDROXIDE 400 MG/5ML PO SUSP
30.0000 mL | Freq: Every day | ORAL | Status: DC | PRN
  Administered 2023-04-20: 30 mL via ORAL
  Filled 2023-04-17: qty 30

## 2023-04-17 MED ORDER — HALOPERIDOL LACTATE 5 MG/ML IJ SOLN: 10.0000 mg | Freq: Three times a day (TID) | INTRAMUSCULAR | Status: DC | PRN

## 2023-04-17 MED ORDER — HALOPERIDOL 5 MG PO TABS
5.0000 mg | ORAL_TABLET | Freq: Three times a day (TID) | ORAL | Status: DC | PRN
  Administered 2023-04-18 (×2): 5 mg via ORAL
  Filled 2023-04-17 (×2): qty 1

## 2023-04-17 MED ORDER — TRAZODONE HCL 50 MG PO TABS
50.0000 mg | ORAL_TABLET | Freq: Every evening | ORAL | Status: DC | PRN
  Administered 2023-04-17 – 2023-04-23 (×6): 50 mg via ORAL
  Filled 2023-04-17 (×7): qty 1

## 2023-04-17 NOTE — Tx Team (Signed)
Initial Treatment Plan 04/17/2023 10:10 PM Albert [name] [id number]:[id number]    PATIENT STRESSORS: Medication change or noncompliance     PATIENT STRENGTHS: Motivation for treatment/growth  Supportive family/friends    PATIENT IDENTIFIED PROBLEMS: Suicidal Ideation  Depression  Anxiety                 DISCHARGE CRITERIA:  Improved stabilization in mood, thinking, and/or behavior Verbal commitment to aftercare and medication compliance  PRELIMINARY DISCHARGE PLAN: Outpatient therapy Return to previous living arrangement  PATIENT/FAMILY INVOLVEMENT: This treatment plan has been presented to and reviewed with the patient, Albert [name]. The patient has been given the opportunity to ask questions and make suggestions.  Elmyra Ricks, RN 04/17/2023, 10:10 PM

## 2023-04-17 NOTE — Progress Notes (Signed)
Report called to [facility]  in [facility].  Informed family of room number, that it is a private room with no TV. Will transport to [facility]  when  order to transfer initiated.

## 2023-04-17 NOTE — Discharge Summary (Signed)
Physician Discharge Summary   Patient: Albert [name] MRN: [id number] DOB: 25-Jul-1965  Admit date:     04/14/2023  Discharge date: 04/17/23  Discharge Physician: Marrion Coy   PCP: Kandyce Rud, MD   Recommendations at discharge:   Follow-up with PCP in 2 weeks after discharge from psychiatry. Follow-up with neurology in 1 month.  Discharge Diagnoses: Principal Problem:   Syncope Active Problems:   Severe depressed bipolar I disorder without psychotic features (HCC)   GAD (generalized anxiety disorder)   Benign hypertensive kidney disease with chronic kidney disease   Essential hypertriglyceridemia   Gastroesophageal reflux disease without esophagitis   Secondary hyperparathyroidism of renal origin (HCC)   CKD stage 3a, GFR 45-59 ml/min (HCC)   Seizure (HCC)   Bipolar 1 disorder (HCC)   Lactic acid acidosis   Constipation   Suicide ideation   Bipolar disorder, current episode depressed, severe, without psychotic features (HCC)   New onset seizure (HCC)  Resolved Problems:   * No resolved hospital problems. [facility] Course: Mr. 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 Patient is seen by neurology, started on Keppra.  Patient also has suicide ideation, psychiatry consult obtained.  MRI brain from outside hospital was normal in report. Patient also seen by psychiatry for suicidal ideation.  Has been accepted for psych admission.  Assessment and Plan: *New seizure. Patient does seem to have prolonged confusion after seizure episode, EEG showed encephalopathy.  Patient has been seen by neurology, brain MRI performed at outside hospital was normal. Patient is started on Keppra, changed to oral. Condition has been stable.  Patient is medically stable for transfer to psych unit.  Patient  will be followed by neurology as outpatient in 1 month.     Suicidal ideation. Patient voiced suicidal ideation, has been seen by psychiatry, recommend psychiatry admission.   Patient medically stable for transfer.   Constipation MiraLAX twice daily resumed   Lactic acid acidosis Resolved, secondary to seizure.   Bipolar 1 disorder [facility]) Psychiatry recommended taping of clozapine due to increased seizure risk.  I will reduce the dose from 500 to 300 mg daily and gradually taper off.     CKD stage 3a, GFR 45-59 ml/min (HCC) Continue to follow.   GAD (generalized anxiety disorder) Home Ativan 0.5 mg 4 times daily converted to IV dosing resumed on admission       Consultants: Neurology and psychiatry. Procedures performed: None  Disposition:  psychiatry unit Diet recommendation:  Discharge Diet Orders (From admission, onward)     Start     Ordered   04/17/23 0000  Diet - low sodium heart healthy        04/17/23 1034           Cardiac diet DISCHARGE MEDICATION: Allergies as of 04/17/2023       Reactions   Meloxicam Other (See Comments)   Dizziness   Nsaids Other (See Comments)   Hallucinations   Ibuprofen Other (See Comments)   Can not take because taking lithium   Prednisone Other (See Comments)   Can't sleep    Wellbutrin [bupropion] Other (See Comments)   Suicidal thoughts        Medication List     STOP taking these medications    sildenafil 100 MG tablet Commonly known as: VIAGRA  TAKE these medications    acetaminophen 500 MG tablet Commonly known as: TYLENOL Take 1,000 mg by mouth every 6 (six) hours as needed for moderate pain.   atropine 1 % ophthalmic solution Place 2 drops under the tongue 3 (three) times daily as needed.   cholecalciferol 25 MCG (1000 UNIT) tablet Commonly known as: VITAMIN D3 Take 1,000 Units by mouth daily.   cloZAPine 100 MG tablet Commonly known as: CLOZARIL Take 3 tablets (300 mg total) by mouth  at bedtime. What changed: how much to take   esomeprazole 40 MG capsule Commonly known as: NEXIUM Take 40 mg by mouth daily.   fenofibrate 145 MG tablet Commonly known as: TRICOR Take 145 mg by mouth daily.   fluvoxaMINE 100 MG tablet Commonly known as: LUVOX 1/2 tablet in the morning for 1 week, then 1 tablet each morning   levETIRAcetam 500 MG tablet Commonly known as: KEPPRA Take 1 tablet (500 mg total) by mouth 2 (two) times daily.   levothyroxine 75 MCG tablet Commonly known as: SYNTHROID Take 75 mcg by mouth daily before breakfast.   lithium carbonate 150 MG capsule TAKE 1 CAPSULE BY MOUTH AT BEDTIME   LORazepam 0.5 MG tablet Commonly known as: ATIVAN TAKE ONE TABLET BY MOUTH FOUR TIMES DAILY   lubiprostone 24 MCG capsule Commonly known as: AMITIZA Take 24 mcg by mouth 2 (two) times daily with a meal.   metFORMIN 500 MG 24 hr tablet Commonly known as: GLUCOPHAGE-XR Take 500 mg by mouth 2 (two) times daily with a meal.   risperidone 4 MG tablet Commonly known as: RISPERDAL TAKE 1 TABLET BY MOUTH TWICE DAILY What changed: when to take this   tamsulosin 0.4 MG Caps capsule Commonly known as: FLOMAX Take 1 capsule (0.4 mg total) by mouth daily.   VITAMIN B-12 IJ Inject as directed every 30 (thirty) days.        Follow-up Information     Kandyce Rud, MD Follow up in 2 week(s).   Specialty: Family Medicine Contact information: [address] [facility] [place] [address] [phone]                Discharge Exam: Ceasar Mons Weights   04/16/23 2305  Weight: 69 kg   General exam: Appears calm and comfortable  Respiratory system: Clear to auscultation. Respiratory effort normal. Cardiovascular system: S1 & S2 heard, RRR. No JVD, murmurs, rubs, gallops or clicks. No pedal edema. Gastrointestinal system: Abdomen is nondistended, soft and nontender. No organomegaly or masses felt. Normal bowel sounds  heard. Central nervous system: Alert and oriented. No focal neurological deficits. Extremities: Symmetric 5 x 5 power. Skin: No rashes, lesions or ulcers Psychiatry: Judgement and insight appear normal. Mood & affect appropriate.    Condition at discharge: good  The results of significant diagnostics from this hospitalization (including imaging, microbiology, ancillary and laboratory) are listed below for reference.   Imaging Studies: EEG adult Result Date: 04/14/2023 Charlsie Quest, MD     04/14/2023  5:09 PM Patient Name: KENSEN TIERRABLANCA MRN: [id number] Epilepsy Attending: Charlsie Quest Referring Physician/Provider: Lovenia Kim, DO Date: 04/14/2023 Duration: 30.04 mins Patient history: 57 yo m with seizures getting eeg to evaluate for seizure Level of alertness:  comatose/ lethargic AEDs during EEG study: LEV, Ativan Technical aspects: This EEG study was done with scalp electrodes positioned according to the 10-20 International system of electrode placement. Electrical activity was reviewed with band pass filter  of 1-70Hz , sensitivity of 7 uV/mm, display speed of 72mm/sec with a 60Hz  notched filter applied as appropriate. EEG data were recorded continuously and digitally stored.  Video monitoring was available and reviewed as appropriate. Description: EEG showed continuous generalized 3 to 6 Hz theta-delta slowing admixed with 12-14hz  beta activity. Hyperventilation and photic stimulation were not performed.   ABNORMALITY - Continuous slow, generalized IMPRESSION: This study is suggestive of moderate to severe diffuse encephalopathy. No seizures or epileptiform discharges were seen throughout the recording. Charlsie Quest   DG Chest Port 1 View Result Date: 04/14/2023 CLINICAL DATA:  Syncope EXAM: PORTABLE CHEST 1 VIEW COMPARISON:  X-ray 10/24/2020. FINDINGS: No consolidation, pneumothorax or effusion. No edema. Normal cardiopericardial silhouette. The right inferior costophrenic angle is  clipped off the edge of the film. Overlapping cardiac leads. IMPRESSION: No acute cardiopulmonary disease. Electronically Signed   By: Karen Kays M.D.   On: 04/14/2023 15:32   CT Head Wo Contrast Result Date: 04/14/2023 CLINICAL DATA:  Seizure, new onset, no history of trauma. EXAM: CT HEAD WITHOUT CONTRAST TECHNIQUE: Contiguous axial images were obtained from the base of the skull through the vertex without intravenous contrast. RADIATION DOSE REDUCTION: This exam was performed according to the departmental dose-optimization program which includes automated exposure control, adjustment of the mA and/or kV according to patient size and/or use of iterative reconstruction technique. COMPARISON:  Head CT 08/01/2022. FINDINGS: Brain: No acute hemorrhage. 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 Gray-white differentiation is otherwise preserved. Unchanged age advanced volume loss. No acute hydrocephalus or extra-axial collection. No mass effect or midline shift. Vascular: No hyperdense vessel or unexpected calcification. Skull: No calvarial fracture or suspicious bone lesion. Skull base is unremarkable. Sinuses/Orbits: No acute finding. Other: None. IMPRESSION: 1. No acute intracranial abnormality. 2. 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 3. Unchanged age advanced volume loss. Electronically Signed   By: Orvan Falconer M.D.   On: 04/14/2023 10:43    Microbiology: Results for orders placed or performed during the hospital encounter of 08/11/22  SARS Coronavirus 2 by RT PCR (hospital order, performed in [facility] hospital lab) *cepheid single result test*     Status: None   Collection Time: 08/12/22 12:29 AM  Result Value Ref Range Status   SARS Coronavirus 2 by RT PCR NEGATIVE NEGATIVE Final    Comment: Performed at [facility] Lab, [address] [place], [place] [address]    Labs: CBC: Recent Labs  Lab 04/14/23 1047 04/15/23 0710  WBC 6.2 9.5   NEUTROABS 3.8  --   HGB 12.0* 11.1*  HCT 36.9* 33.6*  MCV 94.6 93.9  PLT 368 396   Basic Metabolic Panel: Recent Labs  Lab 04/14/23 1047 04/14/23 2038 04/15/23 0710 04/17/23 0314  NA 138  --  141 144  K 3.6  --  3.5 4.4  CL 107  --  115* 109  CO2 20*  --  22 28  GLUCOSE 229*  --  88 92  BUN 17  --  20 16  CREATININE 1.50*  --  1.68* 1.43*  CALCIUM 9.2  --  9.2 8.7*  MG  --  2.3  --   --    Liver Function Tests: Recent Labs  Lab 04/14/23 1047  AST 21  ALT 10  ALKPHOS 39  BILITOT 0.3  PROT 6.5  ALBUMIN 3.8   CBG: Recent Labs  Lab 04/16/23 2030 04/17/23 0743  GLUCAP 102* 89    Discharge time spent:  greater than 30 minutes.  Signed: Marrion Coy, MD [facility] 04/17/2023

## 2023-04-17 NOTE — Progress Notes (Signed)
Patient admitted from [facility] - 1[facility], report received from [place], [place]. Pt calm and pleasant during assessment. Pt denies SI/HI/AVH at this time. Pt stated his SI comes and goes. Pt verbally contracts for safety with this Clinical research associate. Pt skin assessment completed with [place], RN, no abnormalities found, no contraband found. Pt oriented to the unit and his room, given snack. Pt given education, support, and encouragement to be active in his treatment plan. Pt being monitored Q 15 minutes for safety per unit protocol, remains safe on the unit

## 2023-04-17 NOTE — Progress Notes (Signed)
Patient discharged to [facility] unit .  Transported by wheelchair  with security.

## 2023-04-17 NOTE — Progress Notes (Signed)
Multiple attempts to call report to transfer unit at [phone] per Lennox Pippins, RN, with no answers.  Shift report given to Johnna Acosta, RN.

## 2023-04-17 NOTE — TOC CM/SW Note (Signed)
Transition of Care [facility]) - Inpatient Brief Assessment   Patient Details  Name: Albert [name] MRN: [id number] Date of Birth: 05/08/1965  Transition of Care [facility]) CM/SW Contact:    Rodney Langton, RN Phone Number: 04/17/2023, 9:34 AM   Clinical Narrative:  Brief assessment done.  Patient in need of inpatient psych admission.  Will be admitted to [facility] behavioral med unit later today.  Bedside nurse aware.   Transition of Care Asessment: Insurance and Status: Insurance coverage has been reviewed Patient has primary care physician: Yes Home environment has been reviewed: Yes   Prior/Current Home Services: No current home services Social Drivers of Health Review: SDOH reviewed needs interventions Readmission risk has been reviewed: Yes Transition of care needs: transition of care needs identified, TOC will continue to follow

## 2023-04-17 NOTE — Plan of Care (Signed)
Pt new to the unit tonight, hasn't had time to progress  Problem: Education: Goal: Knowledge of [facility] Education information/materials will improve Outcome: Not Progressing Goal: Emotional status will improve Outcome: Not Progressing Goal: Mental status will improve Outcome: Not Progressing Goal: Verbalization of understanding the information provided will improve Outcome: Not Progressing   Problem: Activity: Goal: Interest or engagement in activities will improve Outcome: Not Progressing Goal: Sleeping patterns will improve Outcome: Not Progressing   Problem: Coping: Goal: Ability to verbalize frustrations and anger appropriately will improve Outcome: Not Progressing Goal: Ability to demonstrate self-control will improve Outcome: Not Progressing   Problem: Health Behavior/Discharge Planning: Goal: Identification of resources available to assist in meeting health care needs will improve Outcome: Not Progressing Goal: Compliance with treatment plan for underlying cause of condition will improve Outcome: Not Progressing   Problem: Physical Regulation: Goal: Ability to maintain clinical measurements within normal limits will improve Outcome: Not Progressing   Problem: Safety: Goal: Periods of time without injury will increase Outcome: Not Progressing

## 2023-04-18 DIAGNOSIS — F429 Obsessive-compulsive disorder, unspecified: Secondary | ICD-10-CM | POA: Diagnosis present

## 2023-04-18 DIAGNOSIS — F3130 Bipolar disorder, current episode depressed, mild or moderate severity, unspecified: Secondary | ICD-10-CM | POA: Diagnosis not present

## 2023-04-18 MED ORDER — CLOZAPINE 100 MG PO TABS
300.0000 mg | ORAL_TABLET | Freq: Every day | ORAL | Status: DC
  Administered 2023-04-18 – 2023-04-23 (×6): 300 mg via ORAL
  Filled 2023-04-18 (×6): qty 3

## 2023-04-18 MED ORDER — FENOFIBRATE 54 MG PO TABS
54.0000 mg | ORAL_TABLET | Freq: Every day | ORAL | Status: DC
  Administered 2023-04-18 – 2023-04-24 (×7): 54 mg via ORAL
  Filled 2023-04-18 (×7): qty 1

## 2023-04-18 MED ORDER — RISPERIDONE 1 MG PO TABS
2.0000 mg | ORAL_TABLET | Freq: Two times a day (BID) | ORAL | Status: DC
  Administered 2023-04-18 – 2023-04-20 (×4): 2 mg via ORAL
  Filled 2023-04-18 (×4): qty 2

## 2023-04-18 MED ORDER — LEVETIRACETAM 500 MG PO TABS
500.0000 mg | ORAL_TABLET | Freq: Two times a day (BID) | ORAL | Status: DC
  Administered 2023-04-18 – 2023-04-24 (×12): 500 mg via ORAL
  Filled 2023-04-18 (×13): qty 1

## 2023-04-18 MED ORDER — FLUVOXAMINE MALEATE 50 MG PO TABS
50.0000 mg | ORAL_TABLET | Freq: Every day | ORAL | Status: DC
  Administered 2023-04-18 – 2023-04-20 (×3): 50 mg via ORAL
  Filled 2023-04-18 (×4): qty 1

## 2023-04-18 MED ORDER — TAMSULOSIN HCL 0.4 MG PO CAPS
0.4000 mg | ORAL_CAPSULE | Freq: Every day | ORAL | Status: DC
  Administered 2023-04-18 – 2023-04-24 (×7): 0.4 mg via ORAL
  Filled 2023-04-18 (×7): qty 1

## 2023-04-18 MED ORDER — LEVOTHYROXINE SODIUM 50 MCG PO TABS
75.0000 ug | ORAL_TABLET | Freq: Every day | ORAL | Status: DC
  Administered 2023-04-19 – 2023-04-24 (×6): 75 ug via ORAL
  Filled 2023-04-18 (×6): qty 2

## 2023-04-18 MED ORDER — LITHIUM CARBONATE 150 MG PO CAPS: 150.0000 mg | ORAL_CAPSULE | Freq: Every day | ORAL | Status: DC

## 2023-04-18 NOTE — BH IP Treatment Plan (Addendum)
Interdisciplinary Treatment and Diagnostic Plan Update  04/18/2023 Time of Session: 09:20 Albert [name] MRN: [id number]  Principal Diagnosis: Depression  Secondary Diagnoses: Principal Problem:   Depression   Current Medications:  Current Facility-Administered Medications  Medication Dose Route Frequency Provider Last Rate Last Admin   acetaminophen (TYLENOL) tablet 650 mg  650 mg Oral Q6H PRN Jearld Lesch, NP       alum & mag hydroxide-simeth (MAALOX/MYLANTA) 200-200-20 MG/5ML suspension 30 mL  30 mL Oral Q4H PRN Dixon, Rashaun M, NP       haloperidol (HALDOL) tablet 5 mg  5 mg Oral TID PRN Jearld Lesch, NP       And   diphenhydrAMINE (BENADRYL) capsule 50 mg  50 mg Oral TID PRN Jearld Lesch, NP       haloperidol lactate (HALDOL) injection 5 mg  5 mg Intramuscular TID PRN Jearld Lesch, NP       And   diphenhydrAMINE (BENADRYL) injection 50 mg  50 mg Intramuscular TID PRN Jearld Lesch, NP       And   LORazepam (ATIVAN) injection 2 mg  2 mg Intramuscular TID PRN Jearld Lesch, NP       haloperidol lactate (HALDOL) injection 10 mg  10 mg Intramuscular TID PRN Jearld Lesch, NP       And   diphenhydrAMINE (BENADRYL) injection 50 mg  50 mg Intramuscular TID PRN Jearld Lesch, NP       And   LORazepam (ATIVAN) injection 2 mg  2 mg Intramuscular TID PRN Jearld Lesch, NP       hydrOXYzine (ATARAX) tablet 25 mg  25 mg Oral TID PRN Jearld Lesch, NP   25 mg at 04/17/23 2242   magnesium hydroxide (MILK OF MAGNESIA) suspension 30 mL  30 mL Oral Daily PRN Jearld Lesch, NP       traZODone (DESYREL) tablet 50 mg  50 mg Oral QHS PRN Jearld Lesch, NP   50 mg at 04/17/23 2242   PTA Medications: Medications Prior to Admission  Medication Sig Dispense Refill Last Dose/Taking   acetaminophen (TYLENOL) 500 MG tablet Take 1,000 mg by mouth every 6 (six) hours as needed for moderate pain.      atropine 1 % ophthalmic solution Place 2 drops under the tongue 3  (three) times daily as needed. 2 mL 1    cholecalciferol (VITAMIN D3) 25 MCG (1000 UNIT) tablet Take 1,000 Units by mouth daily.      cloZAPine (CLOZARIL) 100 MG tablet Take 3 tablets (300 mg total) by mouth at bedtime.      Cyanocobalamin (VITAMIN B-12 IJ) Inject as directed every 30 (thirty) days.      esomeprazole (NEXIUM) 40 MG capsule Take 40 mg by mouth daily.      fenofibrate (TRICOR) 145 MG tablet Take 145 mg by mouth daily.      fluvoxaMINE (LUVOX) 100 MG tablet 1/2 tablet in the morning for 1 week, then 1 tablet each morning 30 tablet 0    levETIRAcetam (KEPPRA) 500 MG tablet Take 1 tablet (500 mg total) by mouth 2 (two) times daily. 60 tablet 0    levothyroxine (SYNTHROID, LEVOTHROID) 75 MCG tablet Take 75 mcg by mouth daily before breakfast.      lithium carbonate 150 MG capsule TAKE 1 CAPSULE BY MOUTH AT BEDTIME 30 capsule 0    LORazepam (ATIVAN) 0.5 MG tablet TAKE ONE TABLET BY MOUTH FOUR TIMES DAILY  120 tablet 0    lubiprostone (AMITIZA) 24 MCG capsule Take 24 mcg by mouth 2 (two) times daily with a meal.      metFORMIN (GLUCOPHAGE-XR) 500 MG 24 hr tablet Take 500 mg by mouth 2 (two) times daily with a meal.      risperidone (RISPERDAL) 4 MG tablet TAKE 1 TABLET BY MOUTH TWICE DAILY (Patient taking differently: Take 4 mg by mouth daily.) 60 tablet 0    tamsulosin (FLOMAX) 0.4 MG CAPS capsule Take 1 capsule (0.4 mg total) by mouth daily. 90 capsule 3     Patient Stressors: Medication change or noncompliance    Patient Strengths: Motivation for treatment/growth  Supportive family/friends   Treatment Modalities: Medication Management, Group therapy, Case management,  1 to 1 session with clinician, Psychoeducation, Recreational therapy.   Physician Treatment Plan for Primary Diagnosis: Depression Long Term Goal(s):     Short Term Goals:    Medication Management: Evaluate patient's response, side effects, and tolerance of medication regimen.  Therapeutic Interventions: 1 to  1 sessions, Unit Group sessions and Medication administration.  Evaluation of Outcomes: Not Met  Physician Treatment Plan for Secondary Diagnosis: Principal Problem:   Depression  Long Term Goal(s):     Short Term Goals:       Medication Management: Evaluate patient's response, side effects, and tolerance of medication regimen.  Therapeutic Interventions: 1 to 1 sessions, Unit Group sessions and Medication administration.  Evaluation of Outcomes: Not Met   RN Treatment Plan for Primary Diagnosis: Depression Long Term Goal(s): Knowledge of disease and therapeutic regimen to maintain health will improve  Short Term Goals: Ability to remain free from injury will improve, Ability to verbalize frustration and anger appropriately will improve, Ability to demonstrate self-control, Ability to participate in decision making will improve, Ability to verbalize feelings will improve, Ability to disclose and discuss suicidal ideas, Ability to identify and develop effective coping behaviors will improve, and Compliance with prescribed medications will improve  Medication Management: RN will administer medications as ordered by provider, will assess and evaluate patient's response and provide education to patient for prescribed medication. RN will report any adverse and/or side effects to prescribing provider.  Therapeutic Interventions: 1 on 1 counseling sessions, Psychoeducation, Medication administration, Evaluate responses to treatment, Monitor vital signs and CBGs as ordered, Perform/monitor CIWA, COWS, AIMS and Fall Risk screenings as ordered, Perform wound care treatments as ordered.  Evaluation of Outcomes: Not Met   LCSW Treatment Plan for Primary Diagnosis: Depression Long Term Goal(s): Safe transition to appropriate next level of care at discharge, Engage patient in therapeutic group addressing interpersonal concerns.  Short Term Goals: Engage patient in aftercare planning with referrals  and resources, Increase social support, Increase ability to appropriately verbalize feelings, Increase emotional regulation, Facilitate acceptance of mental health diagnosis and concerns, Facilitate patient progression through stages of change regarding substance use diagnoses and concerns, Identify triggers associated with mental health/substance abuse issues, and Increase skills for wellness and recovery  Therapeutic Interventions: Assess for all discharge needs, 1 to 1 time with Social worker, Explore available resources and support systems, Assess for adequacy in community support network, Educate family and significant other(s) on suicide prevention, Complete Psychosocial Assessment, Interpersonal group therapy.  Evaluation of Outcomes: Not Met   Progress in Treatment: Attending groups: No. Participating in groups: No. Taking medication as prescribed: Yes. Toleration medication: Yes. Family/Significant other contact made: No, will contact:  when given permission.  Patient understands diagnosis: Yes. Discussing patient identified problems/goals with staff:  Yes. Medical problems stabilized or resolved: Yes. Denies suicidal/homicidal ideation: No. Issues/concerns per patient self-inventory: No. Other: none.  New problem(s) identified: No, Describe:  none identified.  New Short Term/Long Term Goal(s): medication management for mood stabilization; elimination of SI thoughts; development of comprehensive mental wellness/sobriety plan.  Patient Goals:  "To be better. I have suicidal thoughts that come and go."   Discharge Plan or Barriers: CSW will assist pt with development of an appropriate aftercare/discharge plan.   Reason for Continuation of Hospitalization: Depression Medication stabilization Suicidal ideation  Estimated Length of Stay: 1-7 days  Last 3 [place] Suicide Severity Risk Score: Flowsheet Row Admission (Current) from 04/17/2023 in [facility] INPATIENT BEHAVIORAL MEDICINE  ED to Hosp-Admission (Discharged) from [facility]14/2023 in [facility] 1C MEDICAL TELEMETRY Admission (Discharged) from 12/23/2022 in [facility]  C-SSRS RISK CATEGORY Low Risk No Risk No Risk       Last PHQ 2/9 Scores:     No data to display          Scribe for Treatment Team: Glenis Smoker, LCSW 04/18/2023 10:43 AM

## 2023-04-18 NOTE — Group Note (Signed)
Recreation Therapy Group Note   Group Topic:General Recreation  Group Date: 04/18/2023 Start Time: 1530 End Time: 1645 Facilitators: Rosina Lowenstein, LRT, CTRS Location:  Dayroom  Group Description: Recreation. Patients were given the opportunity to play cards, journal, or listen to music during group. Pt identified and conversated about things they enjoy doing in their free time and how they can continue to do that outside of the hospital.  Goal Area(s) Addressed: Patient will practice making a positive decision. Patient will have the opportunity to try a new leisure activity. Patient will communicate with peers and LRT.   Affect/Mood: N/A   Participation Level: Did not attend    Clinical Observations/Individualized Feedback: Kurtiss did not attend group.   Plan: Continue to engage patient in RT group sessions 2-3x/week.   Rosina Lowenstein, LRT, CTRS 04/18/2023 5:21 PM

## 2023-04-18 NOTE — H&P (Signed)
Psychiatric Admission Assessment Adult  Patient Identification: Albert [name] MRN:  [id number] Date of Evaluation:  04/18/2023 Chief Complaint:  Depression [F32.A] Principal Diagnosis: Depression Diagnosis:  Principal Problem:   Depression  History of Present Illness: Mr. 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  Patient is transferred to [facility] after medical clearance, secondary to depression and suicidal ideation. Chart reviewed, case discussed in multidisciplinary meeting today, patient seen during rounds.Today patient endorsed depressed mood, off-and-on anhedonia, and feelings of helplessness.  He reports he has been suicidal since age 55. He said it" it comes and goes". Patient reports that he has lots of reason to live. He said" I have a family, I love my parents, I love my brother sisters nieces and nephews".  Patient today reports suicidal ideation, denies any intention to harm himself on the unit.  He denies manic or psychotic symptoms.  He reports history of mania in the past.  Patient reports that he gets his CBC every 2 weeks, patient is on Clozaril.  We discussed tapering off Clozaril as close will increase risk of seizure.  Patient reports new onset seizures.  Patient agrees with the plan.  Patient was encouraged to attend group and work on coping strategies.  Past Psychiatric History: Patient reports history of bipolar disorder and OCD.  Patient denies past history of cardiac.  Patient reports he has been on Risperdal, lithium, Clozaril, and Luvox.  He follows with a psychiatrist at [facility] psychiatry in [place]    Is the patient at risk to self? Yes.    Has the patient been a risk to self in the past 6 months? No.  Has the patient been a risk to self within the distant past? No.  Is the patient  a risk to others? No.  Has the patient been a risk to others in the past 6 months? No.  Has the patient been a risk to others within the distant past? No.   [place] Scale:  Flowsheet Row Admission (Current) from 04/17/2023 in [facility] INPATIENT BEHAVIORAL MEDICINE ED to Hosp-Admission (Discharged) from 04/14/2023 in [facility] 1C MEDICAL TELEMETRY Admission (Discharged) from 12/23/2022 in [facility] END[facility]COPY  C-SSRS RISK CATEGORY Low Risk No Risk No Risk        Prior Inpatient Therapy: Yes.    Prior Outpatient Therapy: Yes.     Alcohol Screening: 1. How often do you have a drink containing alcohol?: Never 2. How many drinks containing alcohol do you have on a typical day when you are drinking?: 1 or 2 3. How often do you have six or more drinks on one occasion?: Never AUDIT-C Score: 0 4. How often during the last year have you found that you were not able to stop drinking once you had started?: Never 5. How often during the last year have you failed to do what was normally expected from you because of drinking?: Never 6. How often during the last year have you needed a first drink in the morning to get yourself going after a heavy drinking session?: Never 7. How often during the last year have you had a feeling of guilt of remorse after drinking?: Never 8. How often during the last year have you been unable to remember what happened the night before because you had been drinking?: Never 9. Have  you or someone else been injured as a result of your drinking?: No 10. Has a relative or friend or a doctor or another health worker been concerned about your drinking or suggested you cut down?: No Alcohol Use Disorder Identification Test Final Score (AUDIT): 0 Substance Abuse History in the last 12 months:  No.  Previous Psychotropic Medications: Yes   Past Medical History:  Past Medical History:  Diagnosis Date   ADHD (attention deficit hyperactivity  disorder)    Anemia    Anxiety    Bipolar disorder (HCC)    BPH (benign prostatic hyperplasia)    Chronic kidney disease, stage 3b (HCC)    Constipation    DDD (degenerative disc disease), cervical    Depression    Deviated septum    ED (erectile dysfunction)    GERD (gastroesophageal reflux disease)    Graves disease    History of hiatal hernia    Hypertension    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    Hypothyroidism    Pneumonia    PONV (postoperative nausea and vomiting)     Past Surgical History:  Procedure Laterality Date   BACK SURGERY     lumbar disc   COLONOSCOPY N/A 12/23/2022   Procedure: COLONOSCOPY;  Surgeon: Jaynie Collins, DO;  Location: [facility] ENDOSCOPY;  Service: Gastroenterology;  Laterality: N/A;   colonoscopyx2 N/A    2003, 2016   ESOPHAGOGASTRODUODENOSCOPY     1998, 2002, 2003, 2016   ESOPHAGOGASTRODUODENOSCOPY (EGD) WITH PROPOFOL N/A 09/12/2020   Procedure: ESOPHAGOGASTRODUODENOSCOPY (EGD) WITH PROPOFOL;  Surgeon: Earline Mayotte, MD;  Location: [facility] ENDOSCOPY;  Service: Endoscopy;  Laterality: N/A;  1ST CASE   EYE SURGERY  2006   strabismus surgery   HERNIA REPAIR     Hiatal Hernia   INSERTION OF MESH Bilateral 03/03/2021   Procedure: INSERTION OF MESH;  Surgeon: Leafy Ro, MD;  Location: [facility] ORS;  Service: General;  Laterality: Bilateral;   NASAL SEPTUM SURGERY     NM I- 131 THERAPY FOR ABLATION ([facility] HX)  04/11/2015   thyroid   SPINE SURGERY     XI ROBOTIC ASSISTED PARAESOPHAGEAL HERNIA REPAIR N/A 10/23/2020   Procedure: XI ROBOTIC ASSISTED PARAESOPHAGEAL HERNIA REPAIR WITH MESH AND INCISIONAL HERNIA REPAIR WITH MESH;  Surgeon: Leafy Ro, MD;  Location: [facility] ORS;  Service: General;  Laterality: N/A;   Family History:  Family History  Problem Relation Age of Onset   Prostate cancer Neg Hx    Bladder Cancer Neg Hx    Kidney cancer Neg Hx    Family Psychiatric  History: None reported by the  patient  Tobacco Screening:  Social History   Tobacco Use  Smoking Status Former   Current packs/day: 0.00   Types: Cigarettes   Quit date: 2010   Years since quitting: 14.9   Passive exposure: Past  Smokeless Tobacco Former   Types: Chew    BH Tobacco Counseling     Are you interested in Tobacco Cessation Medications?  No value filed. Counseled patient on smoking cessation:  No value filed. Reason Tobacco Screening Not Completed: No value filed.       Social History:  Social History   Substance and Sexual Activity  Alcohol Use Not Currently     Social History   Substance and Sexual Activity  Drug Use Never    Additional Social History: Marital status: Single Are you sexually active?: No What is your sexual orientation?: Straight Has your  sexual activity been affected by drugs, alcohol, medication, or emotional stress?: N/A Does patient have children?: No                         Allergies:   Allergies  Allergen Reactions   Meloxicam Other (See Comments)    Dizziness   Nsaids Other (See Comments)    Hallucinations   Ibuprofen Other (See Comments)    Can not take because taking lithium   Prednisone Other (See Comments)    Can't sleep    Wellbutrin [Bupropion] Other (See Comments)    Suicidal thoughts   Lab Results:  Results for orders placed or performed during the hospital encounter of 04/14/23 (from the past 48 hours)  Glucose, capillary     Status: Abnormal   Collection Time: 04/16/23  8:30 PM  Result Value Ref Range   Glucose-Capillary 102 (H) 70 - 99 mg/dL    Comment: Glucose reference range applies only to samples taken after fasting for at least 8 hours.  Basic metabolic panel     Status: Abnormal   Collection Time: 04/17/23  3:14 AM  Result Value Ref Range   Sodium 144 135 - 145 mmol/L   Potassium 4.4 3.5 - 5.1 mmol/L   Chloride 109 98 - 111 mmol/L   CO2 28 22 - 32 mmol/L   Glucose, Bld 92 70 - 99 mg/dL    Comment: Glucose  reference range applies only to samples taken after fasting for at least 8 hours.   BUN 16 6 - 20 mg/dL   Creatinine, Ser 1.91 (H) 0.61 - 1.24 mg/dL   Calcium 8.7 (L) 8.9 - 10.3 mg/dL   GFR, Estimated 56 (L) >60 mL/min    Comment: (NOTE) Calculated using the CKD-EPI Creatinine Equation (2021)    Anion gap 7 5 - 15    Comment: Performed at [facility], [address] [place], [place] [address]  Glucose, capillary     Status: None   Collection Time: 04/17/23  7:43 AM  Result Value Ref Range   Glucose-Capillary 89 70 - 99 mg/dL    Comment: Glucose reference range applies only to samples taken after fasting for at least 8 hours.  Glucose, capillary     Status: Abnormal   Collection Time: 04/17/23 11:31 AM  Result Value Ref Range   Glucose-Capillary 149 (H) 70 - 99 mg/dL    Comment: Glucose reference range applies only to samples taken after fasting for at least 8 hours.  Glucose, capillary     Status: Abnormal   Collection Time: 04/17/23  4:52 PM  Result Value Ref Range   Glucose-Capillary 109 (H) 70 - 99 mg/dL    Comment: Glucose reference range applies only to samples taken after fasting for at least 8 hours.  Glucose, capillary     Status: Abnormal   Collection Time: 04/17/23  7:46 PM  Result Value Ref Range   Glucose-Capillary 130 (H) 70 - 99 mg/dL    Comment: Glucose reference range applies only to samples taken after fasting for at least 8 hours.    Blood Alcohol level:  Lab Results  Component Value Date   [facility] <10 04/14/2023   ETH <10 08/01/2022    Metabolic Disorder Labs:  Lab Results  Component Value Date   HGBA1C 5.4 08/12/2022   MPG 108.28 08/12/2022   No results found for: "PROLACTIN" Lab Results  Component Value Date   CHOL 155 08/12/2022   TRIG  146 08/12/2022   HDL 60 08/12/2022   CHOLHDL 2.6 08/12/2022   VLDL 29 08/12/2022   LDLCALC 66 08/12/2022    Current Medications: Current Facility-Administered Medications  Medication Dose Route  Frequency Provider Last Rate Last Admin   acetaminophen (TYLENOL) tablet 650 mg  650 mg Oral Q6H PRN Jearld Lesch, NP       alum & mag hydroxide-simeth (MAALOX/MYLANTA) 200-200-20 MG/5ML suspension 30 mL  30 mL Oral Q4H PRN Dixon, Rashaun M, NP       haloperidol (HALDOL) tablet 5 mg  5 mg Oral TID PRN Jearld Lesch, NP       And   diphenhydrAMINE (BENADRYL) capsule 50 mg  50 mg Oral TID PRN Jearld Lesch, NP       haloperidol lactate (HALDOL) injection 5 mg  5 mg Intramuscular TID PRN Jearld Lesch, NP       And   diphenhydrAMINE (BENADRYL) injection 50 mg  50 mg Intramuscular TID PRN Jearld Lesch, NP       And   LORazepam (ATIVAN) injection 2 mg  2 mg Intramuscular TID PRN Jearld Lesch, NP       haloperidol lactate (HALDOL) injection 10 mg  10 mg Intramuscular TID PRN Jearld Lesch, NP       And   diphenhydrAMINE (BENADRYL) injection 50 mg  50 mg Intramuscular TID PRN Jearld Lesch, NP       And   LORazepam (ATIVAN) injection 2 mg  2 mg Intramuscular TID PRN Jearld Lesch, NP       hydrOXYzine (ATARAX) tablet 25 mg  25 mg Oral TID PRN Jearld Lesch, NP   25 mg at 04/18/23 1304   magnesium hydroxide (MILK OF MAGNESIA) suspension 30 mL  30 mL Oral Daily PRN Jearld Lesch, NP       traZODone (DESYREL) tablet 50 mg  50 mg Oral QHS PRN Jearld Lesch, NP   50 mg at 04/17/23 2242   PTA Medications: Medications Prior to Admission  Medication Sig Dispense Refill Last Dose/Taking   acetaminophen (TYLENOL) 500 MG tablet Take 1,000 mg by mouth every 6 (six) hours as needed for moderate pain.      atropine 1 % ophthalmic solution Place 2 drops under the tongue 3 (three) times daily as needed. 2 mL 1    cholecalciferol (VITAMIN D3) 25 MCG (1000 UNIT) tablet Take 1,000 Units by mouth daily.      cloZAPine (CLOZARIL) 100 MG tablet Take 3 tablets (300 mg total) by mouth at bedtime.      Cyanocobalamin (VITAMIN B-12 IJ) Inject as directed every 30 (thirty) days.       esomeprazole (NEXIUM) 40 MG capsule Take 40 mg by mouth daily.      fenofibrate (TRICOR) 145 MG tablet Take 145 mg by mouth daily.      fluvoxaMINE (LUVOX) 100 MG tablet 1/2 tablet in the morning for 1 week, then 1 tablet each morning 30 tablet 0    levETIRAcetam (KEPPRA) 500 MG tablet Take 1 tablet (500 mg total) by mouth 2 (two) times daily. 60 tablet 0    levothyroxine (SYNTHROID, LEVOTHROID) 75 MCG tablet Take 75 mcg by mouth daily before breakfast.      lithium carbonate 150 MG capsule TAKE 1 CAPSULE BY MOUTH AT BEDTIME 30 capsule 0    LORazepam (ATIVAN) 0.5 MG tablet TAKE ONE TABLET BY MOUTH FOUR TIMES DAILY 120 tablet 0  lubiprostone (AMITIZA) 24 MCG capsule Take 24 mcg by mouth 2 (two) times daily with a meal.      metFORMIN (GLUCOPHAGE-XR) 500 MG 24 hr tablet Take 500 mg by mouth 2 (two) times daily with a meal.      risperidone (RISPERDAL) 4 MG tablet TAKE 1 TABLET BY MOUTH TWICE DAILY (Patient taking differently: Take 4 mg by mouth daily.) 60 tablet 0    tamsulosin (FLOMAX) 0.4 MG CAPS capsule Take 1 capsule (0.4 mg total) by mouth daily. 90 capsule 3     Musculoskeletal: Strength & Muscle Tone: within normal limits Gait & Station: Normal Patient leans: N/A         Psychiatric Specialty Exam:   Presentation  General Appearance:  Appropriate for Environment   Eye Contact: Fair   Speech: Clear and Coherent; Slow   Speech Volume: Decreased   Handedness: Right     Mood and Affect  Mood: Depressed   Affect: Congruent; Constricted     Thought Process  Thought Processes: Goal Directed   Descriptions of Associations:Intact   Orientation:Full (Time, Place and Person)   Thought Content:Abstract Reasoning   History of Schizophrenia/Schizoaffective disorder:No   Duration of Psychotic Symptoms: NA   Hallucinations:Hallucinations: None   Ideas of Reference:None   Suicidal Thoughts:Suicidal Thoughts: Yes, Passive SI Passive Intent and/or Plan: Without  Plan   Homicidal Thoughts:Homicidal Thoughts: No     Sensorium  Memory: Immediate Fair; Recent Fair   Judgment: Fair   Insight: Fair     Art therapist  Concentration: Fair   Attention Span: Poor   Recall: Good   Fund of Knowledge: Fair   Language: Fair     Psychomotor Activity  Psychomotor Activity: Psychomotor Activity: Decreased     Assets  Assets: Communication Skills; Desire for Improvement; Housing; Social Support; Resilience     Sleep  Sleep: Sleep: Fair     Physical Exam: Physical Exam Constitutional:      Appearance: Normal appearance.  HENT:     Head: Normocephalic and atraumatic.  Cardiovascular:     Rate and Rhythm: Regular rhythm.  Pulmonary:     Effort: Pulmonary effort is normal.  Skin:    General: Skin is warm.  Neurological:     Mental Status: He is alert and oriented to person, place, and time.      Review of Systems  Constitutional:  Negative for chills and fever.  HENT:  Negative for hearing loss and sore throat.   Eyes:  Negative for blurred vision.  Respiratory:  Negative for cough and shortness of breath.   Cardiovascular:  Negative for chest pain and palpitations.  Gastrointestinal:  Negative for nausea and vomiting.  Neurological:  Negative for speech change and headaches.  Psychiatric/Behavioral:  Positive for depression and suicidal ideas.    Blood pressure 120/82, pulse 87, temperature 99.1 F (37.3 C), resp. rate 19, height 5\' 9"  (1.753 m), weight 69 kg, SpO2 97%. Body mass index is 22.46 kg/m.  Treatment Plan Summary: Daily contact with patient to assess and evaluate symptoms and progress in treatment and Medication management  Observation Level/Precautions:  15 minute checks  Laboratory:  CBC Chemistry Profile UDS  Psychotherapy:    Medications:    Consultations:    Discharge Concerns:    Estimated LOS: 5-7 days  Other:     Physician Treatment Plan for Primary Diagnosis: Depression Long  Term Goal(s): Improvement in symptoms so as ready for discharge  Short Term Goals: Ability to identify changes in  lifestyle to reduce recurrence of condition will improve, Ability to verbalize feelings will improve, Ability to disclose and discuss suicidal ideas, Ability to demonstrate self-control will improve, Ability to identify and develop effective coping behaviors will improve, Ability to maintain clinical measurements within normal limits will improve, Compliance with prescribed medications will improve, and Ability to identify triggers associated with substance abuse/mental health issues will improve  Patient is admitted to locked unit under safety precautions We will restart home medicine, will decrease the dose of Clozaril to 300 mg by mouth at bedtime and eventually taper it off secondary to new onset seizures Will discontinue lithium 150 mg by mouth at bedtime, patient's creatinine is on the higher side.  Patient was encouraged to keep himself hydrated. Will restart Risperdal at 2 mg by mouth twice daily  will restart Luvox at 50 mg by mouth daily  we will restart Keppra at 500 mg by mouth twice daily Will restart Synthroid 75 mcg by mouth daily Will restart Flomax at 0.4 mg by mouth daily  I certify that inpatient services furnished can reasonably be expected to improve the patient's condition.    Lewanda Rife, MD

## 2023-04-18 NOTE — Progress Notes (Signed)
   04/18/23 0800  Psych Admission Type (Psych Patients Only)  Admission Status Voluntary  Psychosocial Assessment  Patient Complaints Other (Comment) (Passive SI)  Eye Contact Fair  Facial Expression Flat;Anxious  Affect Anxious  Speech Logical/coherent  Interaction Assertive  Motor Activity Shuffling;Slow  Appearance/Hygiene Unremarkable  Behavior Characteristics Cooperative;Unable to participate  Mood Anxious  Thought Process  Coherency WDL  Content WDL  Delusions None reported or observed  Perception WDL  Hallucination None reported or observed  Judgment Impaired  Confusion WDL  Danger to Self  Current suicidal ideation? Passive  Description of Suicide Plan no plan  Agreement Not to Harm Self Yes  Description of Agreement Verbal contract for safety

## 2023-04-18 NOTE — Group Note (Signed)
Date:  04/18/2023 Time:  9:42 PM  Group Topic/Focus:  Wrap-Up Group:   The focus of this group is to help patients review their daily goal of treatment and discuss progress on daily workbooks.    Participation Level:  Minimal  Participation Quality:  Appropriate, Attentive, and Resistant  Affect:  Appropriate and Flat  Cognitive:  Appropriate  Insight: Appropriate  Engagement in Group:  Limited and Resistant  Modes of Intervention:  Discussion  Additional Comments:   After the group setting, in private, that he was experiencing higher levels of SI feelings since being on this unit. He expressed that this unit is loud and very distracting. He feels that the SI is enhanced because of the environment. Staff listened to the concern, passed the information off to the RN. Staff told pt to try and relax and close eyes in bed. He had received his night time meds and needed to try and take advantage of the quite unit and get some rest. Pt was respective to this information and went to his room.   Maglione,Velvie Thomaston E 04/18/2023, 9:42 PM

## 2023-04-18 NOTE — BHH Suicide Risk Assessment (Signed)
[facility] Admission Suicide Risk Assessment   Nursing information obtained from:  Patient Demographic factors:  Male, Caucasian Current Mental Status:  Suicidal ideation indicated by others Loss Factors:  NA Historical Factors:  Impulsivity Risk Reduction Factors:  Positive social support, Positive therapeutic relationship, Living with another person, especially a relative   Principal Problem: Depression Diagnosis:  Principal Problem:   Depression  Subjective Data:  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  Patient is transferred to [facility] after medical clearance, secondary to depression and suicidal ideation   Alcohol Use Disorder Identification Test Final Score (AUDIT): 0 The "Alcohol Use Disorders Identification Test", Guidelines for Use in Primary Care, Second Edition.  World Science writer [facility]). Score between 0-7:  no or low risk or alcohol related problems. Score between 8-15:  moderate risk of alcohol related problems. Score between 16-19:  high risk of alcohol related problems. Score 20 or above:  warrants further diagnostic evaluation for alcohol dependence and treatment.   CLINICAL FACTORS:   Bipolar Disorder:   Depressive phase Previous Psychiatric Diagnoses and Treatments   Musculoskeletal: Strength & Muscle Tone: within normal limits Gait & Station: Normal Patient leans: N/A         Psychiatric Specialty Exam:   Presentation  General Appearance:  Appropriate for Environment   Eye Contact: Fair   Speech: Clear and Coherent; Slow   Speech Volume: Decreased   Handedness: Right     Mood and Affect  Mood: Depressed   Affect: Congruent; Constricted     Thought Process  Thought Processes: Goal Directed   Descriptions of Associations:Intact    Orientation:Full (Time, Place and Person)   Thought Content:Abstract Reasoning   History of Schizophrenia/Schizoaffective disorder:No   Duration of Psychotic Symptoms: NA   Hallucinations:Hallucinations: None   Ideas of Reference:None   Suicidal Thoughts:Suicidal Thoughts: Yes, Passive SI Passive Intent and/or Plan: Without Plan   Homicidal Thoughts:Homicidal Thoughts: No     Sensorium  Memory: Immediate Fair; Recent Fair   Judgment: Fair   Insight: Fair     Art therapist  Concentration: Fair   Attention Span: Poor   Recall: Good   Fund of Knowledge: Fair   Language: Fair     Psychomotor Activity  Psychomotor Activity: Psychomotor Activity: Decreased     Assets  Assets: Communication Skills; Desire for Improvement; Housing; Social Support; Resilience     Sleep  Sleep: Sleep: Fair     Physical Exam: Physical Exam Constitutional:      Appearance: Normal appearance.  HENT:     Head: Normocephalic and atraumatic.  Cardiovascular:     Rate and Rhythm: Regular rhythm.  Pulmonary:     Effort: Pulmonary effort is normal.  Skin:    General: Skin is warm.  Neurological:     Mental Status: He is alert and oriented to person, place, and time.      Review of Systems  Constitutional:  Negative for chills and fever.  HENT:  Negative for hearing loss and sore throat.   Eyes:  Negative for blurred vision.  Respiratory:  Negative for cough and shortness of breath.   Cardiovascular:  Negative for chest pain and palpitations.  Gastrointestinal:  Negative for nausea and vomiting.  Neurological:  Negative for speech change and headaches.  Psychiatric/Behavioral:  Positive for depression and suicidal ideas.  Blood pressure 120/82, pulse 87, temperature 99.1 F (37.3 C), resp. rate 19, height 5\' 9"  (1.753 m), weight 69 kg, SpO2 97%. Body mass index is 22.46 kg/m.   COGNITIVE FEATURES THAT CONTRIBUTE TO RISK:  Thought constriction (tunnel  vision)    SUICIDE RISK:   Moderate:  Frequent suicidal ideation with limited intensity, and duration, no associated intent, good self-control, limited dysphoria/symptomatology, some risk factors present, and identifiable protective factors, including available and accessible social support.  PLAN OF CARE: Per H&P  I certify that inpatient services furnished can reasonably be expected to improve the patient's condition.   Lewanda Rife, MD

## 2023-04-18 NOTE — Progress Notes (Signed)
Po, prn haldol 5 mg and benadryl 50 mg given to patient for increased agitation and anxiety at 1716. Patient tolerated medication well with no side effect noted. No distress noted at this time, Q 15 minutes safety observation in place. Satff will continue to provide support to patient.

## 2023-04-18 NOTE — Group Note (Signed)
[facility] LCSW Group Therapy Note    Group Date: 04/18/2023 Start Time: 1315 End Time: 1415  Type of Therapy and Topic:  Group Therapy:  Overcoming Obstacles  Participation Level:  [facility] PARTICIPATION LEVEL: Did Not Attend  Mood:  Description of Group:   In this group patients will be encouraged to explore what they see as obstacles to their own wellness and recovery. They will be guided to discuss their thoughts, feelings, and behaviors related to these obstacles. The group will process together ways to cope with barriers, with attention given to specific choices patients can make. Each patient will be challenged to identify changes they are motivated to make in order to overcome their obstacles. This group will be process-oriented, with patients participating in exploration of their own experiences as well as giving and receiving support and challenge from other group members.  Therapeutic Goals: 1. Patient will identify personal and current obstacles as they relate to admission. 2. Patient will identify barriers that currently interfere with their wellness or overcoming obstacles.  3. Patient will identify feelings, thought process and behaviors related to these barriers. 4. Patient will identify two changes they are willing to make to overcome these obstacles:    Summary of Patient Progress Patient did not attend group.  He left after the beginning of the group conversation.   Therapeutic Modalities:   Cognitive Behavioral Therapy Solution Focused Therapy Motivational Interviewing Relapse Prevention Therapy   Harden Mo, LCSW

## 2023-04-18 NOTE — [facility] Suicide Risk Assessment (Signed)
[facility] INPATIENT:  Family/Significant Other Suicide Prevention Education  Suicide Prevention Education:  Patient Refusal for Family/Significant Other Suicide Prevention Education: The patient Albert [name] has refused to provide written consent for family/significant other to be provided Family/Significant Other Suicide Prevention Education during admission and/or prior to discharge.  Physician notified.  SPE completed with pt, as pt refused to consent to family contact. SPI pamphlet provided to pt and pt was encouraged to share information with support network, ask questions, and talk about any concerns relating to SPE. Pt denies access to guns/firearms and verbalized understanding of information provided. Mobile Crisis information also provided to pt.  Glenis Smoker 04/18/2023, 2:08 PM

## 2023-04-18 NOTE — BHH Counselor (Signed)
Adult Comprehensive Assessment  Patient ID: Albert [name], male   DOB: 1966-03-16, 57 y.o.   MRN: [id number]  Information Source: Information source: Patient  Current Stressors:  Patient states their primary concerns and needs for treatment are:: "I had seizures." Patient states their goals for this hospitilization and ongoing recovery are:: "To stop the suicidal thought and feel better all around." Educational / Learning stressors: None reported Employment / Job issues: Pt is on disability. Family Relationships: He describes his relationships with family as "good." Surveyor, quantity / Lack of resources (include bankruptcy): None reported Housing / Lack of housing: None reported Physical health (include injuries & life threatening diseases): None reported Social relationships: None reported Substance abuse: None reported Bereavement / Loss: None reported  Living/Environment/Situation:  Living Arrangements: Parent Living conditions (as described by patient or guardian): He describes his life conditions as "good." Who else lives in the home?: Pt had been staying with his parents but does have his own place. How long has patient lived in current situation?: Was staying with his parents for six months. Pt shares that he has lived in his home 15 years or more. What is atmosphere in current home: Comfortable, Loving, Supportive  Family History:  Marital status: Single Are you sexually active?: No What is your sexual orientation?: Straight Has your sexual activity been affected by drugs, alcohol, medication, or emotional stress?: N/A Does patient have children?: No  Childhood History:  By whom was/is the patient raised?: Both parents Additional childhood history information: ''I had a good childhood." Description of patient's relationship with caregiver when they were a child: "Pretty good." Patient's description of current relationship with people who raised him/her: "Very good." How were you  disciplined when you got in trouble as a child/adolescent?: "I got either reprimanded, more grounding that anything else. 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?: No  Financial Resources:   Surveyor, quantity resources: Insurance claims handler, Medicare Does patient have a Lawyer or guardian?: No  Alcohol/Substance Abuse:   What has been your use of drugs/alcohol within the last 12 months?: Pt denies any use of substances. "Not in 20 years." Alcohol/Substance Abuse Treatment Hx: Denies past history If yes, describe treatment: N/A Has alcohol/substance abuse ever caused legal problems?:  (N/A)  Social Support System:   Patient's Community Support System: Good Describe Community Support System: "My parents, my family, couple of friends." Type of faith/religion: "Christian" How does patient's faith help to cope with current  illness?: "Not like I should, used to go to church."  Leisure/Recreation:  Do You Have Hobbies?: Yes Leisure and Hobbies: "TEFL teacher radios. I don't have a lot of hobbies."  Strengths/Needs:   What is the patient's perception of their strengths?: "I don't know of anything I do well." Patient states they can use these personal strengths during their treatment to contribute to their recovery: N/A Patient states these barriers may affect/interfere with their treatment: Pt denies any barriers. Patient states these barriers may affect their return to the community: Pt denies any barriers. Other important information patient would like considered in planning for their treatment: N/A  Discharge Plan:   Currently receiving community mental health services: Yes (From Whom) (Dr. Sherril Croon ([facility])) Patient states concerns and preferences for aftercare planning are: Pt would like to be reconnected with current outpatient provider Patient states they will know when they are safe and ready for discharge when: "I guess I'll just know. When the suicidal thoughts go away." Does patient have access to transportation?: Yes Does patient have financial barriers related to discharge medications?: No Patient description of barriers related to discharge medications: N/A Will patient be returning to same living situation after discharge?: Yes  Summary/Recommendations:   Summary and Recommendations (to be completed by the evaluator): Pt is a 48 year old, single, [place] [facility] [place] He shared that he came into the hospital because he "had a seizure." Pt expressed that he would like to "stop the suicidal thoughts and feel better all around." He denied any history of trauma or abuse. Pt described his childhood as great and shared that he was raised by both parents along with a younger brother. He described all their relationships as good. Pt is on disability (since 2013) for mental health reasons and  "physical" per pt report. He has his own home although he has been staying with his parents for the past six months. Pt denied any use of substances, however UDS screened positive for MDMA. Upon discharge, pt plans to return to his parents' home. He has his own transportation and has Medicare. Pt is currently connected with Dr. Sherril Croon for medication management and reported having upcoming appointments with him in January and February. Pt would like to continue with current provider after discharge. Recommendations include: crisis stabilization, therapeutic milieu, encourage group attendance and participation, medication management for mood stabilization and development of comprehensive mental wellness plan.  Glenis Smoker. 04/18/2023

## 2023-04-18 NOTE — Plan of Care (Signed)
°  Problem: Education: Goal: Knowledge of [facility] Education information/materials will improve Outcome: Progressing Goal: Emotional status will improve Outcome: Progressing Goal: Mental status will improve Outcome: Progressing Goal: Verbalization of understanding the information provided will improve Outcome: Progressing   Problem: Activity: Goal: Interest or engagement in activities will improve Outcome: Progressing Goal: Sleeping patterns will improve Outcome: Progressing   Problem: Coping: Goal: Ability to verbalize frustrations and anger appropriately will improve Outcome: Progressing Goal: Ability to demonstrate self-control will improve Outcome: Progressing   Problem: Health Behavior/Discharge Planning: Goal: Identification of resources available to assist in meeting health care needs will improve Outcome: Progressing Goal: Compliance with treatment plan for underlying cause of condition will improve Outcome: Progressing   Problem: Physical Regulation: Goal: Ability to maintain clinical measurements within normal limits will improve Outcome: Progressing   Problem: Safety: Goal: Periods of time without injury will increase Outcome: Progressing   Problem: Safety: Goal: Periods of time without injury will increase Outcome: Progressing

## 2023-04-18 NOTE — Group Note (Signed)
Recreation Therapy Group Note   Group Topic:Coping Skills  Group Date: 04/18/2023 Start Time: 1000 End Time: 1100 Facilitators: Rosina Lowenstein, LRT, CTRS Location:  Craft Room  Group Description: Mind Map.  Patient was provided a blank template of a diagram with 32 blank boxes in a tiered system, branching from the center (similar to a bubble chart). LRT directed patients to label the middle of the diagram "Coping Skills". LRT and patients then came up with 8 different coping skills as examples. Pt were directed to record their coping skills in the 2nd tier boxes closest to the center.  Patients would then share their coping skills with the group as LRT wrote them out. LRT gave a handout of 99 different coping skills at the end of group.   Goal Area(s) Addressed: Patients will be able to define "coping skills". Patient will identify new coping skills.  Patient will increase communication.   Affect/Mood: N/A   Participation Level: Did not attend    Clinical Observations/Individualized Feedback: Albert [name] did not attend group.   Plan: Continue to engage patient in RT group sessions 2-3x/week.   Rosina Lowenstein, LRT, CTRS 04/18/2023 1:05 PM

## 2023-04-19 ENCOUNTER — Telehealth: Payer: Self-pay | Admitting: Psychiatry

## 2023-04-19 DIAGNOSIS — F314 Bipolar disorder, current episode depressed, severe, without psychotic features: Secondary | ICD-10-CM

## 2023-04-19 LAB — HEMOGLOBIN A1C
Hgb A1c MFr Bld: 5.8 % — ABNORMAL HIGH (ref 4.8–5.6)
Mean Plasma Glucose: 119.76 mg/dL

## 2023-04-19 LAB — LIPID PANEL
Cholesterol: 210 mg/dL — ABNORMAL HIGH (ref 0–200)
HDL: 81 mg/dL (ref >40)
LDL Cholesterol: 100 mg/dL — ABNORMAL HIGH (ref 0–99)
Total CHOL/HDL Ratio: 2.6 {ratio}
Triglycerides: 143 mg/dL (ref <150)
VLDL: 29 mg/dL (ref 0–40)

## 2023-04-19 NOTE — Progress Notes (Signed)
[facility] MD Progress Note  04/19/2023 5:15 PM Albert [name]  MRN:  [id number]  Subjective:  Notes, labs, and vital signs reviewed; progression meeting completed.  Albert [name] reported high depression with passive, intermittent suicidal ideations;  Anxiety is "bad", no panic attacks recently.  Sleep and appetite are "fair".  He feels the seizures prompted this episode.  He typically lives along but recently staying with his parents due to instable mood.  Denies psychosis and mania symptoms.  No side effects from his medications.  Albert [name] was interacting in the milieu appropriately with peers and staff, playing basketball earlier today.  Principal Problem: Bipolar disorder, current episode depressed, severe, without psychotic features (HCC) Diagnosis: Principal Problem:   Bipolar disorder, current episode depressed, severe, without psychotic features (HCC) Active Problems:   Obsessive-compulsive disorder  Total Time spent with patient: 30 minutes  Past Psychiatric History: bipolar d/o, depression, anxiety, ADHD  Past Medical History:  Past Medical History:  Diagnosis Date   ADHD (attention deficit hyperactivity disorder)    Anemia    Anxiety    Bipolar disorder (HCC)    BPH (benign prostatic hyperplasia)    Chronic kidney disease, stage 3b (HCC)    Constipation    DDD (degenerative disc disease), cervical    Depression    Deviated septum    ED (erectile dysfunction)    GERD (gastroesophageal reflux disease)    Graves disease    History of hiatal hernia    Hypertension    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    Hypothyroidism    Pneumonia    PONV (postoperative nausea and vomiting)     Past Surgical History:  Procedure Laterality Date   BACK SURGERY     lumbar disc   COLONOSCOPY N/A 12/23/2022   Procedure: COLONOSCOPY;  Surgeon: Jaynie Collins, DO;  [facility] END[facility]COPY;  Service: Gastroenterology;  Laterality: N/A;   colonoscopyx2 N/A    2003, 2016    ESOPHAGOGASTRODUODENOSCOPY     1998, 2002, 2003, 2016   ESOPHAGOGASTRODUODENOSCOPY (EGD) WITH PROPOFOL N/A 09/12/2020   Procedure: ESOPHAGOGASTRODUODENOSCOPY (EGD) WITH PROPOFOL;  Surgeon: Earline Mayotte, MD;  Location: [facility] ENDOSCOPY;  Service: Endoscopy;  Laterality: N/A;  1ST CASE   EYE SURGERY  2006   strabismus surgery   HERNIA REPAIR     Hiatal Hernia   INSERTION OF MESH Bilateral 03/03/2021   Procedure: INSERTION OF MESH;  Surgeon: Leafy Ro, MD;  Location: [facility] ORS;  Service: General;  Laterality: Bilateral;   NASAL SEPTUM SURGERY     NM I- 131 THERAPY FOR ABLATION ([facility] HX)  04/11/2015   thyroid   SPINE SURGERY     XI ROBOTIC ASSISTED PARAESOPHAGEAL HERNIA REPAIR N/A 10/23/2020   Procedure: XI ROBOTIC ASSISTED PARAESOPHAGEAL HERNIA REPAIR WITH MESH AND INCISIONAL HERNIA REPAIR WITH MESH;  Surgeon: Leafy Ro, MD;  Location: [facility] ORS;  Service: General;  Laterality: N/A;   Family History:  Family History  Problem Relation Age of Onset   Prostate cancer Neg Hx    Bladder Cancer Neg Hx    Kidney cancer Neg Hx    Family Psychiatric  History: none Social History:  Social History   Substance and Sexual Activity  Alcohol Use Not Currently     Social History   Substance and Sexual Activity  Drug Use Never    Social History   Socioeconomic History   Marital status: Single    Spouse name: Not on file   Number of  children: Not on file   Years of education: Not on file   Highest education level: Not on file  Occupational History   Not on file  Tobacco Use   Smoking status: Former    Current packs/day: 0.00    Types: Cigarettes    Quit date: 2010    Years since quitting: 14.9    Passive exposure: Past   Smokeless tobacco: Former    Types: Associate Professor status: Never Used  Substance and Sexual Activity   Alcohol use: Not Currently   Drug use: Never   Sexual activity: Yes  Other Topics Concern   Not on file  Social History Narrative    Not on file   Social Drivers of Health   Financial Resource Strain: Low Risk  (11/03/2022)   [facility], [facility]   Overall Financial Resource Strain (CARDIA)    Difficulty of Paying Living Expenses: Not very hard  Food Insecurity: No Food Insecurity (04/17/2023)   Hunger Vital Sign    Worried About Running Out of Food in the Last Year: Never true    Ran Out of Food in the Last Year: Never true  Transportation Needs: No Transportation Needs (04/17/2023)   PRAPARE - Administrator, Civil Service (Medical): No    Lack of Transportation (Non-Medical): No  Physical Activity: Not on file  Stress: Stress Concern Present (08/12/2020)   Received from [facility], [facility] - Occupational Stress Questionnaire    Feeling of Stress : Very much  Social Connections: Unknown (09/01/2021)   Received from [facility], [facility]   Social Network    Social Network: Not on file   Additional Social History: lives along, currently staying with his parents     Sleep: Fair  Appetite:  Fair  Current Medications: Current Facility-Administered Medications  Medication Dose Route Frequency Provider Last Rate Last Admin   acetaminophen (TYLENOL) tablet 650 mg  650 mg Oral Q6H PRN Jearld Lesch, NP       alum & mag hydroxide-simeth (MAALOX/MYLANTA) 200-200-20 MG/5ML suspension 30 mL  30 mL Oral Q4H PRN Dixon, Rashaun M, NP       cloZAPine (CLOZARIL) tablet 300 mg  300 mg Oral QHS Lewanda Rife, MD   300 mg at 04/18/23 2049   haloperidol (HALDOL) tablet 5 mg  5 mg Oral TID PRN Jearld Lesch, NP   5 mg at 04/18/23 2243   And   diphenhydrAMINE (BENADRYL) capsule 50 mg  50 mg Oral TID PRN Jearld Lesch, NP   50 mg at 04/18/23 2242   haloperidol lactate (HALDOL) injection 5 mg  5 mg Intramuscular TID PRN Jearld Lesch, NP       And   diphenhydrAMINE (BENADRYL) injection 50 mg   50 mg Intramuscular TID PRN Jearld Lesch, NP       And   LORazepam (ATIVAN) injection 2 mg  2 mg Intramuscular TID PRN Jearld Lesch, NP       haloperidol lactate (HALDOL) injection 10 mg  10 mg Intramuscular TID PRN Jearld Lesch, NP       And   diphenhydrAMINE (BENADRYL) injection 50 mg  50 mg Intramuscular TID PRN Jearld Lesch, NP       And   LORazepam (ATIVAN) injection 2 mg  2 mg Intramuscular TID PRN Jearld Lesch, NP  fenofibrate tablet 54 mg  54 mg Oral Daily Lewanda Rife, MD   54 mg at 04/19/23 0958   fluvoxaMINE (LUVOX) tablet 50 mg  50 mg Oral Daily Lewanda Rife, MD   50 mg at 04/19/23 2952   hydrOXYzine (ATARAX) tablet 25 mg  25 mg Oral TID PRN Jearld Lesch, NP   25 mg at 04/18/23 1304   levETIRAcetam (KEPPRA) tablet 500 mg  500 mg Oral BID Lewanda Rife, MD   500 mg at 04/19/23 1714   levothyroxine (SYNTHROID) tablet 75 mcg  75 mcg Oral QAC breakfast Lewanda Rife, MD   75 mcg at 04/19/23 8413   magnesium hydroxide (MILK OF MAGNESIA) suspension 30 mL  30 mL Oral Daily PRN Jearld Lesch, NP       risperiDONE (RISPERDAL) tablet 2 mg  2 mg Oral BID Lewanda Rife, MD   2 mg at 04/19/23 1713   tamsulosin (FLOMAX) capsule 0.4 mg  0.4 mg Oral Daily Lewanda Rife, MD   0.4 mg at 04/19/23 2440   traZODone (DESYREL) tablet 50 mg  50 mg Oral QHS PRN Jearld Lesch, NP   50 mg at 04/18/23 2050    Lab Results:  Results for orders placed or performed during the hospital encounter of 04/14/23 (from the past 48 hours)  Glucose, capillary     Status: Abnormal   Collection Time: 04/17/23  7:46 PM  Result Value Ref Range   Glucose-Capillary 130 (H) 70 - 99 mg/dL    Comment: Glucose reference range applies only to samples taken after fasting for at least 8 hours.    Blood Alcohol level:  Lab Results  Component Value Date   ETH <10 04/14/2023   ETH <10 08/01/2022    Metabolic Disorder Labs: Lab Results  Component Value Date    HGBA1C 5.4 08/12/2022   MPG 108.28 08/12/2022   No results found for: "PROLACTIN" Lab Results  Component Value Date   CHOL 155 08/12/2022   TRIG 146 08/12/2022   HDL 60 08/12/2022   CHOLHDL 2.6 08/12/2022   VLDL 29 08/12/2022   LDLCALC 66 08/12/2022    Physical Findings: AIMS:  , ,  ,  ,    CIWA:    COWS:     Musculoskeletal: Strength & Muscle Tone: within normal limits Gait & Station: normal Patient leans: N/A  Psychiatric Specialty Exam: Physical Exam Vitals and nursing note reviewed.  Constitutional:      Appearance: Normal appearance.  HENT:     Head: Normocephalic.     Nose: Nose normal.  Pulmonary:     Effort: Pulmonary effort is normal.  Musculoskeletal:        General: Normal range of motion.     Cervical back: Normal range of motion.  Neurological:     General: No focal deficit present.     Mental Status: He is alert and oriented to person, place, and time.     Review of Systems  Psychiatric/Behavioral:  Positive for depression and suicidal ideas. The patient is nervous/anxious.   All other systems reviewed and are negative.   Blood pressure 128/88, pulse 95, temperature 98.5 F (36.9 C), temperature source Oral, resp. rate 20, height 5\' 9"  (1.753 m), weight 69 kg, SpO2 99%.Body mass index is 22.46 kg/m.  General Appearance: Casual  Eye Contact:  Good  Speech:  Normal Rate  Volume:  Normal  Mood:  Anxious and Depressed  Affect:  Congruent  Thought Process:  Coherent  Orientation:  Full (Time, Place, and Person)  Thought Content:  Rumination  Suicidal Thoughts:  Yes.  without intent/plan  Homicidal Thoughts:  No  Memory:  Immediate;   Good Recent;   Good Remote;   Good  Judgement:  Fair  Insight:  Fair  Psychomotor Activity:  Normal  Concentration:  Concentration: Good and Attention Span: Good  Recall:  Good  Fund of Knowledge:  Good  Language:  Good  Akathisia:  No  Handed:  Right  AIMS (if indicated):     Assets:  Housing Leisure  Time Resilience Social Support  ADL's:  Intact  Cognition:  WNL  Sleep:         Physical Exam: Physical Exam Vitals and nursing note reviewed.  Constitutional:      Appearance: Normal appearance.  HENT:     Head: Normocephalic.     Nose: Nose normal.  Pulmonary:     Effort: Pulmonary effort is normal.  Musculoskeletal:        General: Normal range of motion.     Cervical back: Normal range of motion.  Neurological:     General: No focal deficit present.     Mental Status: He is alert and oriented to person, place, and time.    Review of Systems  Psychiatric/Behavioral:  Positive for depression and suicidal ideas. The patient is nervous/anxious.   All other systems reviewed and are negative.  Blood pressure 128/88, pulse 95, temperature 98.5 F (36.9 C), temperature source Oral, resp. rate 20, height 5\' 9"  (1.753 m), weight 69 kg, SpO2 99%. Body mass index is 22.46 kg/m.   Treatment Plan Summary: Daily contact with patient to assess and evaluate symptoms and progress in treatment, Medication management, and Plan : Bipolar affective disorder, depressed, severe without psychosis: Clozaril 300 mg daily Luvox 50 mg daily Risperdal 2 mg BID  Anxiety: Hydroxyzine 25 mg TID PRN  Nanine Means, NP 04/19/2023, 5:15 PM

## 2023-04-19 NOTE — Group Note (Signed)
Recreation Therapy Group Note   Group Topic:Other  Group Date: 04/19/2023 Start Time: 1530 End Time: 1630 Facilitators: Clinton Gallant, CTRS Location:  Craft Room  LRT, nurses, social workers, Facilities manager, and patients got together for [employer]. LRT provided Christmas theme coloring pages, as well. Everyone voluntarily took turns singing and talking about their holiday traditions.    Affect/Mood: Appropriate   Participation Level: Active   Modes of Intervention: Activity    Clinical Observations/Individualized Feedback: Nahuel was present in the craft room group. Pt spent his time coloring with colored pencils. Pt minimally interacted with LRT or peers while in group.   Plan: Continue to engage patient in RT group sessions 2-3x/week.   Rosina Lowenstein, LRT, CTRS 04/19/2023 5:45 PM

## 2023-04-19 NOTE — Progress Notes (Signed)
Continues to present to desk that he is having suicidal thoughts with no plans. Veteran verbally contracts for safety. Isolative to self and room. Comes out for snacks and meals. Minimal interaction with peers. Encouragement and support provided. Safety checks maintained. Medications given as prescribed. Pt receptive and remains safe on unit with q 15 min checks

## 2023-04-19 NOTE — Group Note (Signed)
Recreation Therapy Group Note   Group Topic:Goal Setting  Group Date: 04/19/2023 Start Time: 1000 End Time: 1100 Facilitators: Rosina Lowenstein, LRT, CTRS Location:  Craft Room  Group Description: Product/process development scientist. Patients were given many different magazines, a glue stick, markers, and a piece of cardstock paper. LRT and pts discussed the importance of having goals in life. LRT and pts discussed the difference between short-term and long-term goals, as well as what a SMART goal is. LRT encouraged pts to create a vision board, with images they picked and then cut out with safety scissors from the magazine, for themselves, that capture their short and long-term goals. LRT encouraged pts to show and explain their vision board to the group.   Goal Area(s) Addressed:  Patient will gain knowledge of short vs. long term goals.  Patient will identify goals for themselves. Patient will practice setting SMART goals. Patient will verbalize their goals to LRT and peers.   Affect/Mood: Appropriate   Participation Level: Active and Engaged   Participation Quality: Independent   Behavior: Appropriate, Calm, and Cooperative   Speech/Thought Process: Coherent   Insight: Good   Judgement: Good   Modes of Intervention: Art, Education, Exploration, Guided Discussion, and Support   Patient Response to Interventions:  Attentive, Engaged, and Receptive   Education Outcome:  Acknowledges education   Clinical Observations/Individualized Feedback: Albert [name] was mostly active in their participation of session activities and group discussion. Pt identified "I want to eat dark chocolate and get a dog when I leave here" as his goals. Pt minimally interacted with LRT and peers while in group. However, pt was pleasant when sharing his goals to the group.    Plan: Continue to engage patient in RT group sessions 2-3x/week.   Rosina Lowenstein, LRT, CTRS 04/19/2023 12:56 PM

## 2023-04-19 NOTE — Plan of Care (Signed)
  Problem: Education: Goal: Emotional status will improve Outcome: Not Progressing Goal: Mental status will improve Outcome: Not Progressing Goal: Verbalization of understanding the information provided will improve Outcome: Progressing   Problem: Coping: Goal: Ability to verbalize frustrations and anger appropriately will improve Outcome: Progressing   Problem: Safety: Goal: Periods of time without injury will increase Outcome: Progressing

## 2023-04-19 NOTE — Telephone Encounter (Signed)
Returned call to mom. I reiterated that we are hands off while Ty[facility] is inpatient, but that Dr. Jennelle Human could see their records and they could see ours. They can contact Dr. Jennelle Human if needed. Mom expressed frustration that no one will talk with her. She said they have run multiple tests and haven't found anything.

## 2023-04-19 NOTE — Group Note (Signed)
Date:  04/19/2023 Time:  10:59 PM  Group Topic/Focus:  Wrap-Up Group:   The focus of this group is to help patients review their daily goal of treatment and discuss progress on daily workbooks.    Participation Level:  Minimal  Participation Quality:  Appropriate and Attentive  Affect:  Appropriate and Resistant  Cognitive:  Alert  Insight: Appropriate and Good  Engagement in Group:  Engaged and Limited  Modes of Intervention:  Discussion, Support, and    Additional Comments:  Pts main concerns are: Handling his SI thoughts. Also, the concern over the medicine and times in which we are administering those here versus at home. Staff has explained to him in brief why this main be. Patients wife would like to talk with RN or MD tomorrow. During visiting hours she stated she called today but never received a return call.   Maglione,Makoto Sellitto E 04/19/2023, 10:59 PM

## 2023-04-19 NOTE — Group Note (Signed)
Date:  04/19/2023 Time:  3:35 PM  Group Topic/Focus:  Activity Group:  The focus of the group is to promote activity for the patients to encourage them to go outside to the courtyard for some fresh air and some exercise.    Participation Level:  Active  Participation Quality:  Appropriate  Affect:  Appropriate  Cognitive:  Appropriate  Insight: Good  Engagement in Group:  Engaged  Modes of Intervention:  Activity  Additional Comments:    Albert [name] 04/19/2023, 3:35 PM

## 2023-04-19 NOTE — Progress Notes (Signed)
   04/19/23 1126  Psych Admission Type (Psych Patients Only)  Admission Status Voluntary  Psychosocial Assessment  Patient Complaints Insomnia  Eye Contact Fair  Facial Expression Flat  Affect Flat  Speech Logical/coherent  Interaction Assertive  Motor Activity Slow  Appearance/Hygiene In scrubs  Behavior Characteristics Cooperative  Mood Depressed  Thought Process  Coherency WDL  Content WDL  Delusions None reported or observed  Perception WDL  Hallucination None reported or observed  Judgment Impaired  Confusion WDL  Danger to Self  Current suicidal ideation? Passive  Description of Suicide Plan No plan voiced  Self-Injurious Behavior No self-injurious ideation or behavior indicators observed or expressed   Agreement Not to Harm Self Yes  Description of Agreement verbal  Danger to Others  Danger to Others None reported or observed

## 2023-04-19 NOTE — Group Note (Signed)
LCSW Group Therapy Note   Group Date: 04/19/2023 Start Time: 1300 End Time: 1405   Type of Therapy and Topic:  Group Therapy: Challenging Core Beliefs  Participation Level:  Active  Description of Group:  Patients were educated about core beliefs and asked to identify one harmful core belief that they have. Patients were asked to explore from where those beliefs originate. Patients were asked to discuss how those beliefs make them feel and the resulting behaviors of those beliefs. They were then be asked if those beliefs are true and, if so, what evidence they have to support them. Lastly, group members were challenged to replace those negative core beliefs with helpful beliefs.   Therapeutic Goals:   1. Patient will identify harmful core beliefs and explore the origins of such beliefs. 2. Patient will identify feelings and behaviors that result from those core beliefs. 3. Patient will discuss whether such beliefs are true. 4.  Patient will replace harmful core beliefs with helpful ones.  Summary of Patient Progress:  Patient actively engaged in processing and exploring how core beliefs are formed and how they impact thoughts, feelings, and behaviors. Patient proved open to input from peers and feedback from CSW. Patient demonstrated proficient insight into the subject matter, was respectful and supportive of peers, and participated throughout the entire session.  Therapeutic Modalities: Cognitive Behavioral Therapy; Solution-Focused Therapy   Lowry Ram, Theresia Majors 04/19/2023  2:41 PM

## 2023-04-19 NOTE — Telephone Encounter (Signed)
Pt's mom called at 9:58a.  She stated that they moved pt to [facility] at the hospital on Sun night.  They haven't been able to figure out the reason he had 2 seizures.  The hospital pyschiatrist is looking into his meds.  Mom wants Albert [name] to call her back after 2pm to get more details to Dr Albert [name] because she plans on talking to [name] and the hospital folks between now and then.  Pls call mom back at [phone]    Next appt 1/22

## 2023-04-20 DIAGNOSIS — F314 Bipolar disorder, current episode depressed, severe, without psychotic features: Secondary | ICD-10-CM | POA: Diagnosis not present

## 2023-04-20 MED ORDER — POLYETHYLENE GLYCOL 3350 17 G PO PACK
17.0000 g | PACK | Freq: Every day | ORAL | Status: DC
  Administered 2023-04-21 – 2023-04-24 (×4): 17 g via ORAL
  Filled 2023-04-20 (×4): qty 1

## 2023-04-20 MED ORDER — LORAZEPAM 1 MG PO TABS
1.0000 mg | ORAL_TABLET | Freq: Once | ORAL | Status: AC
  Administered 2023-04-20: 1 mg via ORAL
  Filled 2023-04-20: qty 1

## 2023-04-20 MED ORDER — LITHIUM CARBONATE 150 MG PO CAPS
150.0000 mg | ORAL_CAPSULE | Freq: Every day | ORAL | Status: DC
Start: 2023-04-20 — End: 2023-04-24
  Administered 2023-04-20 – 2023-04-23 (×4): 150 mg via ORAL
  Filled 2023-04-20 (×4): qty 1

## 2023-04-20 MED ORDER — RISPERIDONE 1 MG PO TABS
2.0000 mg | ORAL_TABLET | Freq: Every day | ORAL | Status: DC
  Administered 2023-04-20 – 2023-04-23 (×4): 2 mg via ORAL
  Filled 2023-04-20 (×4): qty 2

## 2023-04-20 MED ORDER — OLANZAPINE 5 MG PO TBDP
5.0000 mg | ORAL_TABLET | Freq: Once | ORAL | Status: AC
  Administered 2023-04-20: 5 mg via ORAL
  Filled 2023-04-20: qty 1

## 2023-04-20 MED ORDER — VITAMIN D 25 MCG (1000 UNIT) PO TABS
1000.0000 [IU] | ORAL_TABLET | Freq: Every day | ORAL | Status: DC
  Administered 2023-04-21 – 2023-04-24 (×4): 1000 [IU] via ORAL
  Filled 2023-04-20 (×4): qty 1

## 2023-04-20 MED ORDER — PANTOPRAZOLE SODIUM 40 MG PO TBEC
80.0000 mg | DELAYED_RELEASE_TABLET | Freq: Every day | ORAL | Status: DC
  Administered 2023-04-21 – 2023-04-24 (×4): 80 mg via ORAL
  Filled 2023-04-20 (×4): qty 2

## 2023-04-20 MED ORDER — OLANZAPINE 5 MG PO TABS: 5.0000 mg | ORAL_TABLET | ORAL | Status: DC

## 2023-04-20 MED ORDER — METFORMIN HCL ER 500 MG PO TB24
1000.0000 mg | ORAL_TABLET | Freq: Every day | ORAL | Status: DC
Start: 2023-04-20 — End: 2023-04-24
  Administered 2023-04-20 – 2023-04-23 (×4): 1000 mg via ORAL
  Filled 2023-04-20 (×5): qty 2

## 2023-04-20 MED ORDER — LUBIPROSTONE 24 MCG PO CAPS
24.0000 ug | ORAL_CAPSULE | Freq: Two times a day (BID) | ORAL | Status: DC
  Administered 2023-04-20 – 2023-04-24 (×8): 24 ug via ORAL
  Filled 2023-04-20 (×10): qty 1

## 2023-04-20 MED ORDER — METFORMIN HCL ER 500 MG PO TB24: 1000.0000 mg | ORAL_TABLET | Freq: Every day | ORAL | Status: DC

## 2023-04-20 MED ORDER — VITAMIN B-12 1000 MCG PO TABS
1000.0000 ug | ORAL_TABLET | Freq: Every day | ORAL | Status: DC
  Administered 2023-04-21 – 2023-04-23 (×3): 1000 ug via ORAL
  Filled 2023-04-20 (×3): qty 1

## 2023-04-20 NOTE — Group Note (Signed)
Date:  04/20/2023 Time:  9:30 PM  Group Topic/Focus:  Managing Feelings:   The focus of this group is to identify what feelings patients have difficulty handling and develop a plan to handle them in a healthier way upon discharge.    Participation Level:  Active  Participation Quality:  Appropriate and Attentive  Affect:  Appropriate  Cognitive:  Alert and Appropriate  Insight: Appropriate  Engagement in Group:  Developing/Improving and Engaged  Modes of Intervention:  Discussion, Rapport Building, and Support  Additional Comments:     Cassadie Pankonin 04/20/2023, 9:30 PM

## 2023-04-20 NOTE — Group Note (Signed)
[facility] LCSW Group Therapy Note   Group Date: 04/20/2023 Start Time: 1300 End Time: 1350   Type of Therapy/Topic:  Group Therapy:  Emotion Regulation  Participation Level:  Minimal    Description of Group:    The purpose of this group is to assist patients in learning to regulate negative emotions and experience positive emotions. Patients will be guided to discuss ways in which they have been vulnerable to their negative emotions. These vulnerabilities will be juxtaposed with experiences of positive emotions or situations, and patients challenged to use positive emotions to combat negative ones. Special emphasis will be placed on coping with negative emotions in conflict situations, and patients will process healthy conflict resolution skills.  Therapeutic Goals: Patient will identify two positive emotions or experiences to reflect on in order to balance out negative emotions:  Patient will label two or more emotions that they find the most difficult to experience:  Patient will be able to demonstrate positive conflict resolution skills through discussion or role plays:   Summary of Patient Progress: Patient was present for part of the group before being called out by nursing staff. He acknowledged feeling some hopelessness around his suicidal thoughts. Pt was called out of group soon afterwards. Insight into himself and the topic remain unclear. However, during his time in the room pt appeared open and receptive to feedback/comments from both his peers and facilitator.   Therapeutic Modalities:   Cognitive Behavioral Therapy Feelings Identification Dialectical Behavioral Therapy   Glenis Smoker, LCSW

## 2023-04-20 NOTE — Plan of Care (Addendum)
Problem: Coping: Goal: Ability to verbalize frustrations and anger appropriately will improve Outcome: Progressing  Problem: Health Behavior/Discharge Planning: Goal: Compliance with treatment plan for underlying cause of condition will improve Outcome: Progressing   Problem: Safety: Goal: Periods of time without injury will increase Outcome: Progressing   Pt presents very restless, fidgety / anxious with increased pacing, flat affect, and depressed mood. Continues to endorse intrusive, racing thoughts about self harmn "It's still there, it's not going away".  Zyprexa zydis 5 mg PO given at 1330 with adverse reactions related to akathisia when reassessed at 1430. Ativan 1 mg po given at 1456 with desired effect when reassessed at 1550. Received PRN vistaril twice this shift with desired effect. Restarted on home Encouraged to attend to his ADLs but still has not showered despite multiple prompts. Pt attended scheduled groups on and off unit without issues.

## 2023-04-20 NOTE — Progress Notes (Signed)
[facility] MD Progress Note  04/20/2023 6:25 AM Albert [name]  MRN:  [id number]  Subjective:  Notes, labs, and vital signs reviewed; progression meeting completed.  Albert [name] reported high depression with passive, intermittent suicidal ideations;  Anxiety is "bad", no panic attacks recently. He rates both depression and anxiety 8/10.  Sleep and appetite are "fair".  He feels the seizures prompted this episode.  He typically lives alone but recently staying with his parents due to instable mood.  Denies psychosis and mania symptoms.  No side effects from his medications.  Albert [name] was interacting in the milieu appropriately with peers and staff. He reports "feeling the same as when he came in" and is anxious about not seeing the results as fast as he would like.   Principal Problem: Bipolar disorder, current episode depressed, severe, without psychotic features (HCC) Diagnosis: Principal Problem:   Bipolar disorder, current episode depressed, severe, without psychotic features (HCC) Active Problems:   Obsessive-compulsive disorder  Total Time spent with patient: 30 minutes  Past Psychiatric History: bipolar d/o, depression, anxiety, ADHD  Past Medical History:  Past Medical History:  Diagnosis Date   ADHD (attention deficit hyperactivity disorder)    Anemia    Anxiety    Bipolar disorder (HCC)    BPH (benign prostatic hyperplasia)    Chronic kidney disease, stage 3b (HCC)    Constipation    DDD (degenerative disc disease), cervical    Depression    Deviated septum    ED (erectile dysfunction)    GERD (gastroesophageal reflux disease)    Graves disease    History of hiatal hernia    Hypertension    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    Hypothyroidism    Pneumonia    PONV (postoperative nausea and vomiting)     Past Surgical History:  Procedure Laterality Date   BACK SURGERY     lumbar disc   COLONOSCOPY N/A 12/23/2022   Procedure: COLONOSCOPY;  Surgeon: Jaynie Collins, DO;  Location: [facility] ENDOSCOPY;  Service: Gastroenterology;  Laterality: N/A;   colonoscopyx2 N/A    2003, 2016   ESOPHAGOGASTRODUODENOSCOPY     1998, 2002, 2003, 2016   ESOPHAGOGASTRODUODENOSCOPY (EGD) WITH PROPOFOL N/A 09/12/2020   Procedure: ESOPHAGOGASTRODUODENOSCOPY (EGD) WITH PROPOFOL;  Surgeon: Earline Mayotte, MD;  Location: [facility] ENDOSCOPY;  Service: Endoscopy;  Laterality: N/A;  1ST CASE   EYE SURGERY  2006   strabismus surgery   HERNIA REPAIR     Hiatal Hernia   INSERTION OF MESH Bilateral 03/03/2021   Procedure: INSERTION OF MESH;  Surgeon: Leafy Ro, MD;  Location: [facility] ORS;  Service: General;  Laterality: Bilateral;   NASAL SEPTUM SURGERY     NM I- 131 THERAPY FOR ABLATION ([facility] HX)  04/11/2015   thyroid   SPINE SURGERY     XI ROBOTIC ASSISTED PARAESOPHAGEAL HERNIA REPAIR N/A 10/23/2020   Procedure: XI ROBOTIC ASSISTED PARAESOPHAGEAL HERNIA REPAIR WITH MESH AND INCISIONAL HERNIA REPAIR WITH MESH;  Surgeon: Leafy Ro, MD;  Location: [facility] ORS;  Service: General;  Laterality: N/A;   Family History:  Family History  Problem Relation Age of Onset   Prostate cancer Neg Hx    Bladder Cancer Neg Hx    Kidney cancer Neg Hx    Family Psychiatric  History: none Social History:  Social History   Substance and Sexual Activity  Alcohol Use Not Currently     Social History   Substance and Sexual Activity  Drug  Use Never    Social History   Socioeconomic History   Marital status: Single    Spouse name: Not on file   Number of children: Not on file   Years of education: Not on file   Highest education level: Not on file  Occupational History   Not on file  Tobacco Use   Smoking status: Former    Current packs/day: 0.00    Types: Cigarettes    Quit date: 2010    Years since quitting: 14.9    Passive exposure: Past   Smokeless tobacco: Former    Types: Associate Professor status: Never Used  Substance and Sexual Activity   Alcohol use:  Not Currently   Drug use: Never   Sexual activity: Yes  Other Topics Concern   Not on file  Social History Narrative   Not on file   Social Drivers of Health   Financial Resource Strain: Low Risk  (11/03/2022)   Received from [facility], [facility]   Overall Financial Resource Strain (CARDIA)    Difficulty of Paying Living Expenses: Not very hard  Food Insecurity: No Food Insecurity (04/17/2023)   Hunger Vital Sign    Worried About Running Out of Food in the Last Year: Never true    Ran Out of Food in the Last Year: Never true  Transportation Needs: No Transportation Needs (04/17/2023)   PRAPARE - Administrator, Civil Service (Medical): No    Lack of Transportation (Non-Medical): No  Physical Activity: Not on file  Stress: Stress Concern Present (08/12/2020)   Received from [facility], [facility] - Occupational Stress Questionnaire    Feeling of Stress : Very much  Social Connections: Unknown (09/01/2021)   Received from [facility], [facility]   Social Network    Social Network: Not on file   Additional Social History: lives along, currently staying with his parents     Sleep: Fair  Appetite:  Fair  Current Medications: Current Facility-Administered Medications  Medication Dose Route Frequency Provider Last Rate Last Admin   acetaminophen (TYLENOL) tablet 650 mg  650 mg Oral Q6H PRN Jearld Lesch, NP       alum & mag hydroxide-simeth (MAALOX/MYLANTA) 200-200-20 MG/5ML suspension 30 mL  30 mL Oral Q4H PRN Dixon, Rashaun M, NP       cloZAPine (CLOZARIL) tablet 300 mg  300 mg Oral QHS Lewanda Rife, MD   300 mg at 04/19/23 2105   haloperidol (HALDOL) tablet 5 mg  5 mg Oral TID PRN Jearld Lesch, NP   5 mg at 04/18/23 2243   And   diphenhydrAMINE (BENADRYL) capsule 50 mg  50 mg Oral TID PRN Jearld Lesch, NP   50 mg at 04/18/23 2242   haloperidol lactate  (HALDOL) injection 5 mg  5 mg Intramuscular TID PRN Jearld Lesch, NP       And   diphenhydrAMINE (BENADRYL) injection 50 mg  50 mg Intramuscular TID PRN Jearld Lesch, NP       And   LORazepam (ATIVAN) injection 2 mg  2 mg Intramuscular TID PRN Jearld Lesch, NP       haloperidol lactate (HALDOL) injection 10 mg  10 mg Intramuscular TID PRN Jearld Lesch, NP       And   diphenhydrAMINE (BENADRYL) injection 50 mg  50 mg Intramuscular TID PRN Dixon, Rashaun  M, NP       And   LORazepam (ATIVAN) injection 2 mg  2 mg Intramuscular TID PRN Durwin Nora, Elray Buba, NP       fenofibrate tablet 54 mg  54 mg Oral Daily Lewanda Rife, MD   54 mg at 04/19/23 0958   fluvoxaMINE (LUVOX) tablet 50 mg  50 mg Oral Daily Lewanda Rife, MD   50 mg at 04/19/23 1610   hydrOXYzine (ATARAX) tablet 25 mg  25 mg Oral TID PRN Jearld Lesch, NP   25 mg at 04/18/23 1304   levETIRAcetam (KEPPRA) tablet 500 mg  500 mg Oral BID Lewanda Rife, MD   500 mg at 04/19/23 1714   levothyroxine (SYNTHROID) tablet 75 mcg  75 mcg Oral QAC breakfast Lewanda Rife, MD   75 mcg at 04/20/23 0604   magnesium hydroxide (MILK OF MAGNESIA) suspension 30 mL  30 mL Oral Daily PRN Jearld Lesch, NP       risperiDONE (RISPERDAL) tablet 2 mg  2 mg Oral BID Charm Rings, NP   2 mg at 04/19/23 1713   tamsulosin (FLOMAX) capsule 0.4 mg  0.4 mg Oral Daily Lewanda Rife, MD   0.4 mg at 04/19/23 9604   traZODone (DESYREL) tablet 50 mg  50 mg Oral QHS PRN Jearld Lesch, NP   50 mg at 04/19/23 2105    Lab Results:  Results for orders placed or performed during the hospital encounter of 04/17/23 (from the past 48 hours)  Lipid panel     Status: Abnormal   Collection Time: 04/19/23  5:39 PM  Result Value Ref Range   Cholesterol 210 (H) 0 - 200 mg/dL   Triglycerides 540 <981 mg/dL   HDL 81 >19 mg/dL   Total CHOL/HDL Ratio 2.6 RATIO   VLDL 29 0 - 40 mg/dL   LDL Cholesterol 147 (H) 0 - 99 mg/dL    Comment:         Total Cholesterol/HDL:CHD Risk Coronary Heart Disease Risk Table                     Men   Women  1/2 Average Risk   3.4   3.3  Average Risk       5.0   4.4  2 X Average Risk   9.6   7.1  3 X Average Risk  23.4   11.0        Use the calculated Patient Ratio above and the CHD Risk Table to determine the patient's CHD Risk.        ATP III CLASSIFICATION (LDL):  <100     mg/dL   Optimal  829-562  mg/dL   Near or Above                    Optimal  130-159  mg/dL   Borderline  130-865  mg/dL   High  >784     mg/dL   Very High Performed at [facility], [address] [place], [place] [address]   Hemoglobin A1c     Status: Abnormal   Collection Time: 04/19/23  5:39 PM  Result Value Ref Range   Hgb A1c MFr Bld 5.8 (H) 4.8 - 5.6 %    Comment: (NOTE) Pre diabetes:          5.7%-6.4%  Diabetes:              >6.4%  Glycemic control for   <7.0%  adults with diabetes    Mean Plasma Glucose 119.76 mg/dL    Comment: Performed at [facility], [address], [place], [place] [address]    Blood Alcohol level:  Lab Results  Component Value Date   [facility] <10 04/14/2023   ETH <10 08/01/2022    Metabolic Disorder Labs: Lab Results  Component Value Date   HGBA1C 5.8 (H) 04/19/2023   MPG 119.76 04/19/2023   MPG 108.28 08/12/2022   No results found for: "PROLACTIN" Lab Results  Component Value Date   CHOL 210 (H) 04/19/2023   TRIG 143 04/19/2023   HDL 81 04/19/2023   CHOLHDL 2.6 04/19/2023   VLDL 29 04/19/2023   LDLCALC 100 (H) 04/19/2023   LDLCALC 66 08/12/2022     Musculoskeletal: Strength & Muscle Tone: within normal limits Gait & Station: normal Patient leans: N/A  Psychiatric Specialty Exam: Physical Exam Vitals and nursing note reviewed.  Constitutional:      Appearance: Normal appearance.  HENT:     Head: Normocephalic.     Nose: Nose normal.  Pulmonary:     Effort: Pulmonary effort is normal.  Musculoskeletal:        General: Normal range  of motion.     Cervical back: Normal range of motion.  Neurological:     General: No focal deficit present.     Mental Status: He is alert and oriented to person, place, and time.     Review of Systems  Psychiatric/Behavioral:  Positive for depression and suicidal ideas. The patient is nervous/anxious.   All other systems reviewed and are negative.   Blood pressure 116/76, pulse 87, temperature 98.2 F (36.8 C), resp. rate 18, height 5\' 9"  (1.753 m), weight 69 kg, SpO2 98%.Body mass index is 22.46 kg/m.  General Appearance: Casual  Eye Contact:  Good  Speech:  Normal Rate  Volume:  Normal  Mood:  Anxious and Depressed  Affect:  Congruent  Thought Process:  Coherent  Orientation:  Full (Time, Place, and Person)  Thought Content:  Rumination  Suicidal Thoughts:  Yes.  without intent/plan  Homicidal Thoughts:  No  Memory:  Immediate;   Good Recent;   Good Remote;   Good  Judgement:  Fair  Insight:  Fair  Psychomotor Activity:  Normal  Concentration:  Concentration: Good and Attention Span: Good  Recall:  Good  Fund of Knowledge:  Good  Language:  Good  Akathisia:  No  Handed:  Right  AIMS (if indicated):     Assets:  Housing Leisure Time Resilience Social Support  ADL's:  Intact  Cognition:  WNL  Sleep:         Physical Exam: Physical Exam Vitals and nursing note reviewed.  Constitutional:      Appearance: Normal appearance.  HENT:     Head: Normocephalic.     Nose: Nose normal.  Pulmonary:     Effort: Pulmonary effort is normal.  Musculoskeletal:        General: Normal range of motion.     Cervical back: Normal range of motion.  Neurological:     General: No focal deficit present.     Mental Status: He is alert and oriented to person, place, and time.    Review of Systems  Psychiatric/Behavioral:  Positive for depression and suicidal ideas. The patient is nervous/anxious.   All other systems reviewed and are negative.  Blood pressure 116/76, pulse  87, temperature 98.2 F (36.8 C), resp. rate 18, height 5\' 9"  (  1.753 m), weight 69 kg, SpO2 98%. Body mass index is 22.46 kg/m.   Treatment Plan Summary: Daily contact with patient to assess and evaluate symptoms and progress in treatment, Medication management, and Plan : Bipolar affective disorder, depressed, severe without psychosis: Clozaril 300 mg daily, Clozaril blood testing Luvox 50 mg daily Risperdal 2 mg BID  Anxiety: Hydroxyzine 25 mg TID PRN  Nanine Means, NP 04/20/2023, 6:25 AM

## 2023-04-20 NOTE — Group Note (Signed)
Date:  04/20/2023 Time:  9:58 AM  Group Topic/Focus:  Goals Group:   The focus of this group is to help patients establish daily goals to achieve during treatment and discuss how the patient can incorporate goal setting into their daily lives to aide in recovery.    Participation Level:  Did Not Attend   Lynelle Smoke [facility] 04/20/2023, 9:58 AM

## 2023-04-20 NOTE — Progress Notes (Signed)
   04/20/23 2058  Psych Admission Type (Psych Patients Only)  Admission Status Voluntary  Psychosocial Assessment  Patient Complaints Anxiety  Eye Contact Fair  Facial Expression Flat  Affect Flat  Speech Logical/coherent  Interaction Assertive  Motor Activity Slow  Appearance/Hygiene In scrubs  Behavior Characteristics Cooperative  Mood Anxious;Depressed  Aggressive Behavior  Effect No apparent injury  Thought Process  Coherency WDL  Content WDL  Delusions None reported or observed  Perception WDL  Hallucination None reported or observed  Judgment Impaired  Confusion WDL  Danger to Self  Current suicidal ideation? Passive  Self-Injurious Behavior No self-injurious ideation or behavior indicators observed or expressed   Agreement Not to Harm Self Yes  Description of Agreement  (Verbal)  Danger to Others  Danger to Others None reported or observed

## 2023-04-20 NOTE — Plan of Care (Signed)
  Problem: Education: Goal: Knowledge of [facility] Education information/materials will improve Outcome: Progressing Goal: Emotional status will improve Outcome: Progressing   Problem: Activity: Goal: Interest or engagement in activities will improve Outcome: Progressing Goal: Sleeping patterns will improve Outcome: Progressing

## 2023-04-20 NOTE — Progress Notes (Signed)
   04/19/23 2000  Psych Admission Type (Psych Patients Only)  Admission Status Voluntary  Psychosocial Assessment  Patient Complaints Insomnia  Eye Contact Fair  Facial Expression Flat  Affect Flat  Speech Logical/coherent  Interaction Assertive  Motor Activity Slow  Appearance/Hygiene In scrubs  Behavior Characteristics Cooperative  Mood Depressed  Thought Process  Coherency WDL  Content WDL  Delusions None reported or observed  Perception WDL  Hallucination None reported or observed  Judgment Impaired  Confusion WDL  Danger to Self  Current suicidal ideation? Passive  Self-Injurious Behavior No self-injurious ideation or behavior indicators observed or expressed   Agreement Not to Harm Self Yes  Description of Agreement  (Verbal)  Danger to Others  Danger to Others None reported or observed

## 2023-04-21 LAB — CBC WITH DIFFERENTIAL/PLATELET
Abs Immature Granulocytes: 0.02 10*3/uL (ref 0.00–0.07)
Basophils Absolute: 0.1 10*3/uL (ref 0.0–0.1)
Basophils Relative: 1 %
Eosinophils Absolute: 0.3 10*3/uL (ref 0.0–0.5)
Eosinophils Relative: 5 %
HCT: 37 % — ABNORMAL LOW (ref 39.0–52.0)
Hemoglobin: 12.1 g/dL — ABNORMAL LOW (ref 13.0–17.0)
Immature Granulocytes: 0 %
Lymphocytes Relative: 25 %
Lymphs Abs: 1.6 10*3/uL (ref 0.7–4.0)
MCH: 30.9 pg (ref 26.0–34.0)
MCHC: 32.7 g/dL (ref 30.0–36.0)
MCV: 94.4 fL (ref 80.0–100.0)
Monocytes Absolute: 0.4 10*3/uL (ref 0.1–1.0)
Monocytes Relative: 7 %
Neutro Abs: 4.1 10*3/uL (ref 1.7–7.7)
Neutrophils Relative %: 62 %
Platelets: 402 10*3/uL — ABNORMAL HIGH (ref 150–400)
RBC: 3.92 MIL/uL — ABNORMAL LOW (ref 4.22–5.81)
RDW: 14 % (ref 11.5–15.5)
WBC: 6.5 10*3/uL (ref 4.0–10.5)
nRBC: 0 % (ref 0.0–0.2)

## 2023-04-21 MED ORDER — GABAPENTIN 100 MG PO CAPS
100.0000 mg | ORAL_CAPSULE | Freq: Two times a day (BID) | ORAL | Status: DC
Start: 2023-04-21 — End: 2023-04-22
  Administered 2023-04-21 – 2023-04-22 (×2): 100 mg via ORAL
  Filled 2023-04-21 (×2): qty 1

## 2023-04-21 NOTE — Progress Notes (Signed)
Assumed care of patient this am, he present A&O x3 , affect & mood sad and depressed. Pt out in the milieu at intervals, isolative and irritable. Pt requested medications on at times "to make my suicidal thoughts go away". Pt committed to safety, stated he had no plan or intent to to act on his thoughts. Pt was compliant with taking his scheduled medications and attended group x1. Pt educated on plan of care, medication regimen and she/he acknowledged an understanding and was without further complaints or concerns. Pt maintained on q 15 min rounds for safety and support.

## 2023-04-21 NOTE — Group Note (Signed)
Date:  04/21/2023 Time:  10:51 AM  Group Topic/Focus:  Goals Group:   The focus of this group is to help patients establish daily goals to achieve during treatment and discuss how the patient can incorporate goal setting into their daily lives to aide in recovery.    Participation Level:  Did Not Attend   Lynelle Smoke [facility] 04/21/2023, 10:51 AM

## 2023-04-21 NOTE — Progress Notes (Signed)
Pharmacy Consult - Clozapine     57 yo male ordered clozapine 300 mg PO daily  This patient's order has been reviewed for prescribing contraindications.    Clozapine [facility]S enrollment Verified:  yes on 08/05/2022 [facility] patient ID: [id number] Current Outpatient Monitoring: Every 2 weeks   Home Regimen:  300 mg PO daily   Dose Adjustments This Admission: N/A   Labs: Date    ANC    Submitted? 12/12 3800 Yes 12/19 4100 Yes     Plan: Continue clozapine 300 mg daily Monitor ANC at least weekly while inpatient   Thank you for involving pharmacy in this patient's care.   Rockwell Alexandria, PharmD Clinical Pharmacist 04/21/2023 10:56 AM

## 2023-04-21 NOTE — Group Note (Signed)
Recreation Therapy Group Note   Group Topic:Coping Skills  Group Date: 04/21/2023 Start Time: 1530 End Time: 1620 Facilitators: Clinton Gallant, CTRS Location:  C[facility]  Group Description: Coping A-Z. LRT and patients engage in a guided discussion on what coping skills are and gave specific examples. LRT passed out a handout labeled Coping A-Z with blank spaces beside each letter. LRT prompted patients to come up with a coping skill for each of the letters. LRT and patients went over the handout and gave ideas for each letter if anyone had any blanks left on their paper. Patients kept this handout with them that listed 26 different coping skills.   Goal Area(s) Addressed: Patients will be able to define "coping skills". Patient will identify new coping skills.  Patient will increase communication.   Affect/Mood: Appropriate   Participation Level: Active and Engaged   Participation Quality: Independent   Behavior: Calm and Cooperative   Speech/Thought Process: Coherent   Insight: Good   Judgement: Good   Modes of Intervention: Education, Guided Discussion, and Worksheet   Patient Response to Interventions:  Attentive, Engaged, Interested , and Receptive   Education Outcome:  Acknowledges education   Clinical Observations/Individualized Feedback: Albert [name] was active in their participation of session activities and group discussion. Pt identified "golfing, eat, sleep, tv" as coping skills. Pt seemed unsure at first and asked LRT many questions. Pt seemed to gain the confidence and knowledge of different coping skills by the end of group. Pt interacted well with LRT and peers duration of session.    Plan: Continue to engage patient in RT group sessions 2-3x/week.   Albert [name], LRT, CTRS 04/21/2023 5:18 PM

## 2023-04-21 NOTE — Group Note (Signed)
Recreation Therapy Group Note   Group Topic:Relaxation  Group Date: 04/21/2023 Start Time: 1000 End Time: 1045 Facilitators: Rosina Lowenstein, LRT, CTRS Location:  C[facility] Room  Group Description: PMR (Progressive Muscle Relaxation). LRT asks patients their current level of stress/anxiety from 1-10, with 10 being the highest. LRT educates patients on what PMR is and the benefits that come from it. Patients are asked to sit with their feet flat on the floor while sitting up and all the way back in their chair, if possible. LRT and pts follow a prompt through a speaker that requires you to tense and release different muscles in their body and focus on their breathing. During session, lights are off and soft music is being played. Pts are given a stress ball to use if needed. At the end of the prompt, LRT asks patients to rank their current levels of stress/anxiety from 1-10, 10 being the highest. LRT provides patients with an education handout on PMR.   Goal Area(s) Addressed:  Patients will be able to describe progressive muscle relaxation.  Patient will practice using relaxation technique. Patient will identify a new coping skill.  Patient will follow multistep directions to reduce anxiety and stress.   Affect/Mood: Appropriate   Participation Level: Active and Engaged   Participation Quality: Independent   Behavior: Appropriate, Calm, and Cooperative   Speech/Thought Process: Coherent   Insight: Good   Judgement: Good   Modes of Intervention: Education and Exploration   Patient Response to Interventions:  Attentive, Engaged, Interested , and Receptive   Education Outcome:  Acknowledges education   Clinical Observations/Individualized Feedback: Laguan was active in their participation of session activities and group discussion. Pt identified that his anxiety was a 8 and stress was as 7 before the session. Pt shared that after the session, his anxiety and stress were both a 7. Pt  commented that it was hard to follow along and focus with the noise distraction. Pt interacted well with LRT and peers duration of session.    Plan: Continue to engage patient in RT group sessions 2-3x/week.   Rosina Lowenstein, LRT, CTRS 04/21/2023 11:41 AM

## 2023-04-21 NOTE — Group Note (Signed)
Date:  04/21/2023 Time:  9:12 PM  Group Topic/Focus:  Identifying Needs:   The focus of this group is to help patients identify their personal needs that have been historically problematic and identify healthy behaviors to address their needs.    Participation Level:  Active  Participation Quality:  Appropriate and Attentive  Affect:  Appropriate  Cognitive:  Alert and Appropriate  Insight: Appropriate, Good, and Improving  Engagement in Group:  Developing/Improving and Engaged  Modes of Intervention:  Clarification, Discussion, Education, Rapport Building, and Support  Additional Comments:     Afua Hoots 04/21/2023, 9:12 PM

## 2023-04-21 NOTE — Progress Notes (Signed)
Nursing Shift Note:  1900-0700  Attended Evening Group: Yes, actively participated Medication Compliant:  Yes Behavior: cooperative Sleep Quality: fair Significant Changes: Pt had questions about the changing dose of his clozaril and was encouraged to discuss with the provider on rounds tomorrow.   04/21/23 2200  Psych Admission Type (Psych Patients Only)  Admission Status Voluntary  Psychosocial Assessment  Patient Complaints Anxiety  Eye Contact Fair  Facial Expression Sad;Anxious  Affect Flat  Speech Logical/coherent  Interaction Assertive  Motor Activity Slow  Appearance/Hygiene In scrubs  Behavior Characteristics Cooperative  Mood Depressed  Thought Process  Coherency WDL  Content WDL  Delusions None reported or observed  Perception WDL  Hallucination None reported or observed  Judgment Impaired  Confusion WDL  Danger to Self  Current suicidal ideation? Passive  Agreement Not to Harm Self Yes  Description of Agreement verbal  Danger to Others  Danger to Others None reported or observed

## 2023-04-21 NOTE — Progress Notes (Signed)
[facility] MD Progress Note  04/21/2023 5:02 AM Albert [name]  MRN:  [id number]  Subjective:  Notes, labs, and vital signs reviewed; progression meeting completed.  Albert [name] continues to report high depression "about the same", suicidal ideations are "still there".  Anxiety is high with no panic attacks.  Sleep is "pretty good", appetite is "ok"  Denies side effects from his medications.  Collateral from his mother, Albert [name]:  Discussed his care with her and she was in agreement to stop taking the Luvox for seizure risk.  Therapeutic communication with listening provided.  Principal Problem: Bipolar disorder, current episode depressed, severe, without psychotic features (HCC) Diagnosis: Principal Problem:   Bipolar disorder, current episode depressed, severe, without psychotic features (HCC) Active Problems:   Obsessive-compulsive disorder  Total Time spent with patient: 30 minutes  Past Psychiatric History: bipolar d/o, depression, anxiety, ADHD  Past Medical History:  Past Medical History:  Diagnosis Date   ADHD (attention deficit hyperactivity disorder)    Anemia    Anxiety    Bipolar disorder (HCC)    BPH (benign prostatic hyperplasia)    Chronic kidney disease, stage 3b (HCC)    Constipation    DDD (degenerative disc disease), cervical    Depression    Deviated septum    ED (erectile dysfunction)    GERD (gastroesophageal reflux disease)    Graves disease    History of hiatal hernia    Hypertension    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    Hypothyroidism    Pneumonia    PONV (postoperative nausea and vomiting)     Past Surgical History:  Procedure Laterality Date   BACK SURGERY     lumbar disc   COLONOSCOPY N/A 12/23/2022   Procedure: COLONOSCOPY;  Surgeon: Jaynie Collins, DO;  Location: [facility] ENDOSCOPY;  Service: Gastroenterology;  Laterality: N/A;   colonoscopyx2 N/A    2003, 2016   ESOPHAGOGASTRODUODENOSCOPY     1998, 2002, 2003, 2016    ESOPHAGOGASTRODUODENOSCOPY (EGD) WITH PROPOFOL N/A 09/12/2020   Procedure: ESOPHAGOGASTRODUODENOSCOPY (EGD) WITH PROPOFOL;  Surgeon: Earline Mayotte, MD;  Location: [facility] ENDOSCOPY;  Service: Endoscopy;  Laterality: N/A;  1ST CASE   EYE SURGERY  2006   strabismus surgery   HERNIA REPAIR     Hiatal Hernia   INSERTION OF MESH Bilateral 03/03/2021   Procedure: INSERTION OF MESH;  Surgeon: Leafy Ro, MD;  Location: [facility] ORS;  Service: General;  Laterality: Bilateral;   NASAL SEPTUM SURGERY     NM I- 131 THERAPY FOR ABLATION ([facility] HX)  04/11/2015   thyroid   SPINE SURGERY     XI ROBOTIC ASSISTED PARAESOPHAGEAL HERNIA REPAIR N/A 10/23/2020   Procedure: XI ROBOTIC ASSISTED PARAESOPHAGEAL HERNIA REPAIR WITH MESH AND INCISIONAL HERNIA REPAIR WITH MESH;  Surgeon: Leafy Ro, MD;  Location: [facility] ORS;  Service: General;  Laterality: N/A;   Family History:  Family History  Problem Relation Age of Onset   Prostate cancer Neg Hx    Bladder Cancer Neg Hx    Kidney cancer Neg Hx    Family Psychiatric  History: none Social History:  Social History   Substance and Sexual Activity  Alcohol Use Not Currently     Social History   Substance and Sexual Activity  Drug Use Never    Social History   Socioeconomic History   Marital status: Single    Spouse name: Not on file   Number of children: Not on file  Years of education: Not on file   Highest education level: Not on file  Occupational History   Not on file  Tobacco Use   Smoking status: Former    Current packs/day: 0.00    Types: Cigarettes    Quit date: 2010    Years since quitting: 14.9    Passive exposure: Past   Smokeless tobacco: Former    Types: Associate Professor status: Never Used  Substance and Sexual Activity   Alcohol use: Not Currently   Drug use: Never   Sexual activity: Yes  Other Topics Concern   Not on file  Social History Narrative   Not on file   Social Drivers of Health   Financial  Resource Strain: Low Risk  (11/03/2022)   Received from [facility], [facility]   Overall Financial Resource Strain (CARDIA)    Difficulty of Paying Living Expenses: Not very hard  Food Insecurity: No Food Insecurity (04/17/2023)   Hunger Vital Sign    Worried About Running Out of Food in the Last Year: Never true    Ran Out of Food in the Last Year: Never true  Transportation Needs: No Transportation Needs (04/17/2023)   PRAPARE - Administrator, Civil Service (Medical): No    Lack of Transportation (Non-Medical): No  Physical Activity: Not on file  Stress: Stress Concern Present (08/12/2020)   Received from [facility], [facility] - Occupational Stress Questionnaire    Feeling of Stress : Very much  Social Connections: Unknown (09/01/2021)   Received from [facility], [facility]   Social Network    Social Network: Not on file   Additional Social History: lives along, currently staying with his parents     Sleep: Fair  Appetite:  Fair  Current Medications: Current Facility-Administered Medications  Medication Dose Route Frequency Provider Last Rate Last Admin   acetaminophen (TYLENOL) tablet 650 mg  650 mg Oral Q6H PRN Jearld Lesch, NP       alum & mag hydroxide-simeth (MAALOX/MYLANTA) 200-200-20 MG/5ML suspension 30 mL  30 mL Oral Q4H PRN Dixon, Rashaun M, NP       cholecalciferol (VITAMIN D3) 25 MCG (1000 UNIT) tablet 1,000 Units  1,000 Units Oral Daily Charm Rings, NP       cloZAPine (CLOZARIL) tablet 300 mg  300 mg Oral QHS Lewanda Rife, MD   300 mg at 04/20/23 2109   cyanocobalamin (VITAMIN B12) tablet 1,000 mcg  1,000 mcg Oral Daily Charm Rings, NP       haloperidol (HALDOL) tablet 5 mg  5 mg Oral TID PRN Jearld Lesch, NP   5 mg at 04/18/23 2243   And   diphenhydrAMINE (BENADRYL) capsule 50 mg  50 mg Oral TID PRN Jearld Lesch, NP   50 mg at 04/18/23  2242   haloperidol lactate (HALDOL) injection 5 mg  5 mg Intramuscular TID PRN Jearld Lesch, NP       And   diphenhydrAMINE (BENADRYL) injection 50 mg  50 mg Intramuscular TID PRN Jearld Lesch, NP       And   LORazepam (ATIVAN) injection 2 mg  2 mg Intramuscular TID PRN Jearld Lesch, NP       haloperidol lactate (HALDOL) injection 10 mg  10 mg Intramuscular TID PRN Jearld Lesch, NP       And   diphenhydrAMINE (  BENADRYL) injection 50 mg  50 mg Intramuscular TID PRN Jearld Lesch, NP       And   LORazepam (ATIVAN) injection 2 mg  2 mg Intramuscular TID PRN Durwin Nora, Rashaun M, NP       fenofibrate tablet 54 mg  54 mg Oral Daily Lewanda Rife, MD   54 mg at 04/20/23 0845   hydrOXYzine (ATARAX) tablet 25 mg  25 mg Oral TID PRN Jearld Lesch, NP   25 mg at 04/20/23 1658   levETIRAcetam (KEPPRA) tablet 500 mg  500 mg Oral BID Lewanda Rife, MD   500 mg at 04/20/23 1658   levothyroxine (SYNTHROID) tablet 75 mcg  75 mcg Oral QAC breakfast Lewanda Rife, MD   75 mcg at 04/20/23 1191   lithium carbonate capsule 150 mg  150 mg Oral QHS Charm Rings, NP   150 mg at 04/20/23 2109   lubiprostone (AMITIZA) capsule 24 mcg  24 mcg Oral BID WC Charm Rings, NP   24 mcg at 04/20/23 2109   magnesium hydroxide (MILK OF MAGNESIA) suspension 30 mL  30 mL Oral Daily PRN Jearld Lesch, NP   30 mL at 04/20/23 0854   metFORMIN (GLUCOPHAGE-XR) 24 hr tablet 1,000 mg  1,000 mg Oral Q supper Charm Rings, NP   1,000 mg at 04/20/23 1658   pantoprazole (PROTONIX) EC tablet 80 mg  80 mg Oral Q1200 Charm Rings, NP       polyethylene glycol (MIRALAX / GLYCOLAX) packet 17 g  17 g Oral Daily Charm Rings, NP       risperiDONE (RISPERDAL) tablet 2 mg  2 mg Oral QHS Charm Rings, NP   2 mg at 04/20/23 2109   tamsulosin (FLOMAX) capsule 0.4 mg  0.4 mg Oral Daily Lewanda Rife, MD   0.4 mg at 04/20/23 0845   traZODone (DESYREL) tablet 50 mg  50 mg Oral QHS PRN Jearld Lesch,  NP   50 mg at 04/20/23 2109    Lab Results:  Results for orders placed or performed during the hospital encounter of 04/17/23 (from the past 48 hours)  Lipid panel     Status: Abnormal   Collection Time: 04/19/23  5:39 PM  Result Value Ref Range   Cholesterol 210 (H) 0 - 200 mg/dL   Triglycerides 478 <295 mg/dL   HDL 81 >62 mg/dL   Total CHOL/HDL Ratio 2.6 RATIO   VLDL 29 0 - 40 mg/dL   LDL Cholesterol 130 (H) 0 - 99 mg/dL    Comment:        Total Cholesterol/HDL:CHD Risk Coronary Heart Disease Risk Table                     Men   Women  1/2 Average Risk   3.4   3.3  Average Risk       5.0   4.4  2 X Average Risk   9.6   7.1  3 X Average Risk  23.4   11.0        Use the calculated Patient Ratio above and the CHD Risk Table to determine the patient's CHD Risk.        ATP III CLASSIFICATION (LDL):  <100     mg/dL   Optimal  865-784  mg/dL   Near or Above                    Optimal  130-159  mg/dL   Borderline  409-811  mg/dL   High  >914     mg/dL   Very High Performed at [facility], [address] [place], [place] [address]   Hemoglobin A1c     Status: Abnormal   Collection Time: 04/19/23  5:39 PM  Result Value Ref Range   Hgb A1c MFr Bld 5.8 (H) 4.8 - 5.6 %    Comment: (NOTE) Pre diabetes:          5.7%-6.4%  Diabetes:              >6.4%  Glycemic control for   <7.0% adults with diabetes    Mean Plasma Glucose 119.76 mg/dL    Comment: Performed at [facility], [address], [place], [place] [address]    Blood Alcohol level:  Lab Results  Component Value Date   [facility] <10 04/14/2023   ETH <10 08/01/2022    Metabolic Disorder Labs: Lab Results  Component Value Date   HGBA1C 5.8 (H) 04/19/2023   MPG 119.76 04/19/2023   MPG 108.28 08/12/2022   No results found for: "PROLACTIN" Lab Results  Component Value Date   CHOL 210 (H) 04/19/2023   TRIG 143 04/19/2023   HDL 81 04/19/2023   CHOLHDL 2.6 04/19/2023   VLDL 29 04/19/2023    LDLCALC 100 (H) 04/19/2023   LDLCALC 66 08/12/2022     Musculoskeletal: Strength & Muscle Tone: within normal limits Gait & Station: normal Patient leans: N/A  Psychiatric Specialty Exam: Physical Exam Vitals and nursing note reviewed.  Constitutional:      Appearance: Normal appearance.  HENT:     Head: Normocephalic.     Nose: Nose normal.  Pulmonary:     Effort: Pulmonary effort is normal.  Musculoskeletal:        General: Normal range of motion.     Cervical back: Normal range of motion.  Neurological:     General: No focal deficit present.     Mental Status: He is alert and oriented to person, place, and time.     Review of Systems  Psychiatric/Behavioral:  Positive for depression and suicidal ideas. The patient is nervous/anxious.   All other systems reviewed and are negative.   Blood pressure (!) 131/93, pulse 98, temperature 98.2 F (36.8 C), resp. rate 18, height 5\' 9"  (1.753 m), weight 69 kg, SpO2 100%.Body mass index is 22.46 kg/m.  General Appearance: Casual  Eye Contact:  Good  Speech:  Normal Rate  Volume:  Normal  Mood:  Anxious and Depressed  Affect:  Congruent  Thought Process:  Coherent  Orientation:  Full (Time, Place, and Person)  Thought Content:  Rumination  Suicidal Thoughts:  Yes.  without intent/plan  Homicidal Thoughts:  No  Memory:  Immediate;   Good Recent;   Good Remote;   Good  Judgement:  Fair  Insight:  Fair  Psychomotor Activity:  Normal  Concentration:  Concentration: Good and Attention Span: Good  Recall:  Good  Fund of Knowledge:  Good  Language:  Good  Akathisia:  No  Handed:  Right  AIMS (if indicated):     Assets:  Housing Leisure Time Resilience Social Support  ADL's:  Intact  Cognition:  WNL  Sleep:         Physical Exam: Physical Exam Vitals and nursing note reviewed.  Constitutional:      Appearance: Normal appearance.  HENT:     Head: Normocephalic.     Nose:  Nose normal.  Pulmonary:     Effort:  Pulmonary effort is normal.  Musculoskeletal:        General: Normal range of motion.     Cervical back: Normal range of motion.  Neurological:     General: No focal deficit present.     Mental Status: He is alert and oriented to person, place, and time.    Review of Systems  Psychiatric/Behavioral:  Positive for depression and suicidal ideas. The patient is nervous/anxious.   All other systems reviewed and are negative.  Blood pressure (!) 131/93, pulse 98, temperature 98.2 F (36.8 C), resp. rate 18, height 5\' 9"  (1.753 m), weight 69 kg, SpO2 100%. Body mass index is 22.46 kg/m.   Treatment Plan Summary: Daily contact with patient to assess and evaluate symptoms and progress in treatment, Medication management, and Plan : Bipolar affective disorder, depressed, severe without psychosis: Clozaril 300 mg daily, Clozaril blood testing Luvox 50 mg daily discontinued as he was started and two days later had a seizure which can occur with Luvox, typically in therapeutic doses.  However, his mood stabilization medications are being reduced per his psychiatrist, Dr. Jennelle Human, and the Luvox could potentially trigger a manic episode. Risperdal 2 mg BID decreased to 2 mg at bedtime as Dr. Jennelle Human his regular psychiatrist is tapering him off of this medication  His mother is in agreement along with the patient.  Anxiety: Hydroxyzine 25 mg TID PRN Started gabapentin 100 mg BID  Nanine Means, NP 04/21/2023, 5:02 AM

## 2023-04-21 NOTE — Group Note (Signed)
Date:  04/21/2023 Time:  3:46 PM  Group Topic/Focus:  Activity Group:  The focus of the group is to promote activity for the patients and encourage them to come out to the courtyard and get some exercise and fresh air.    Participation Level:  Did Not Attend   Albert [name] Albert [name] 04/21/2023, 3:46 PM

## 2023-04-21 NOTE — Plan of Care (Signed)
  Problem: Education: Goal: Knowledge of [facility] Education information/materials will improve Outcome: Progressing Goal: Emotional status will improve Outcome: Progressing Goal: Mental status will improve Outcome: Progressing Goal: Verbalization of understanding the information provided will improve Outcome: Progressing   Problem: Activity: Goal: Interest or engagement in activities will improve Outcome: Progressing Goal: Sleeping patterns will improve Outcome: Progressing   Problem: Coping: Goal: Ability to verbalize frustrations and anger appropriately will improve Outcome: Progressing Goal: Ability to demonstrate self-control will improve Outcome: Progressing   Problem: Health Behavior/Discharge Planning: Goal: Identification of resources available to assist in meeting health care needs will improve Outcome: Progressing Goal: Compliance with treatment plan for underlying cause of condition will improve Outcome: Progressing   Problem: Safety: Goal: Periods of time without injury will increase Outcome: Progressing   Problem: Physical Regulation: Goal: Ability to maintain clinical measurements within normal limits will improve Outcome: Progressing

## 2023-04-22 DIAGNOSIS — F314 Bipolar disorder, current episode depressed, severe, without psychotic features: Secondary | ICD-10-CM | POA: Diagnosis not present

## 2023-04-22 LAB — CLOZAPINE (CLOZARIL)
Clozapine Lvl: 1596 ng/mL — ABNORMAL HIGH (ref 350–600)
NorClozapine: 456 ng/mL
Total(Cloz+Norcloz): 2052 ng/mL

## 2023-04-22 MED ORDER — CLONAZEPAM 0.5 MG PO TABS
0.5000 mg | ORAL_TABLET | Freq: Two times a day (BID) | ORAL | Status: DC
Start: 2023-04-22 — End: 2023-04-24
  Administered 2023-04-22 – 2023-04-24 (×4): 0.5 mg via ORAL
  Filled 2023-04-22 (×4): qty 1

## 2023-04-22 NOTE — Group Note (Signed)
Recreation Therapy Group Note   Group Topic:Leisure Education  Group Date: 04/22/2023 Start Time: 1000 End Time: 1100 Facilitators: Rosina Lowenstein, LRT, CTRS Location:  Craft Room  Group Description: Leisure. Patients were given the option to choose from singing karaoke, coloring mandalas, using oil pastels, journaling, or playing with play-doh. LRT and pts discussed the meaning of leisure, the importance of participating in leisure during their free time/when they're outside of the hospital, as well as how our leisure interests can also serve as coping skills.   Goal Area(s) Addressed:  Patient will identify a current leisure interest.  Patient will learn the definition of "leisure". Patient will practice making a positive decision. Patient will have the opportunity to try a new leisure activity. Patient will communicate with peers and LRT.    Affect/Mood: Appropriate   Participation Level: Active and Engaged   Participation Quality: Independent   Behavior: Appropriate, Calm, and Cooperative   Speech/Thought Process: Coherent   Insight: Good   Judgement: Good   Modes of Intervention: Education, Exploration, Guided Discussion, and Rapport Building   Patient Response to Interventions:  Attentive, Engaged, Interested , and Receptive   Education Outcome:  Acknowledges education   Clinical Observations/Individualized Feedback: Albert [name] was active in their participation of session activities and group discussion. Pt identified "watch tv and walk" as things he does in his free time. Pt chose to color with markers while in group. Pt interacted well with LRT and peers duration of session.    Plan: Continue to engage patient in RT group sessions 2-3x/week.   Rosina Lowenstein, LRT, CTRS 04/22/2023 12:17 PM

## 2023-04-22 NOTE — Progress Notes (Signed)
Complains of Anxiety, and Insomnia. Denies SI, HI, AVH. Pleasant and cooperative. Putting puzzle together with peer. Did attend group. Endorses depression and anxiety. Encouragement and support provided. Safety checks maintained. Meds given as prescribed. Pt receptive and remains safe on unit with q 15 min checks.

## 2023-04-22 NOTE — Group Note (Signed)
Date:  04/22/2023 Time:  10:11 AM  Group Topic/Focus:  Relapse Prevention Planning:   The focus of this group is to define relapse and discuss the need for planning to combat relapse.    Participation Level:  Active  Participation Quality:  Appropriate, Attentive, and Sharing  Affect:  Appropriate and Excited  Cognitive:  Alert, Appropriate, and Oriented  Insight: Appropriate  Engagement in Group:  Developing/Improving, Engaged, and Supportive  Modes of Intervention:  Activity, Discussion, and Education  Additional Comments:    Rosaura Carpenter 04/22/2023, 10:11 AM

## 2023-04-22 NOTE — Plan of Care (Signed)
  Problem: Safety: Goal: Periods of time without injury will increase Outcome: Progressing   

## 2023-04-22 NOTE — Group Note (Signed)
Recreation Therapy Group Note   Group Topic:Health and Wellness  Group Date: 04/22/2023 Start Time: 1530 End Time: 1615 Facilitators: Rosina Lowenstein, LRT, CTRS Location: Courtyard  Group Description: [employer]. LRT and patients played games of basketball, drew with chalk, and played corn hole while outside in the courtyard while getting fresh air and sunlight. Music was being played in the background. LRT and peers conversed about different games they have played before, what they do in their free time and anything else that is on their minds. LRT encouraged pts to drink water after being outside, sweating and getting their heart rate up.  Goal Area(s) Addressed: Patient will build on frustration tolerance skills. Patients will partake in a competitive play game with peers. Patients will gain knowledge of new leisure interest/hobby.    Affect/Mood: N/A   Participation Level: Did not attend    Clinical Observations/Individualized Feedback: Patient did not attend group.   Plan: Continue to engage patient in RT group sessions 2-3x/week.   [address] LRT, CTRS 04/22/2023 5:22 PM

## 2023-04-22 NOTE — Plan of Care (Signed)
°  Problem: Education: Goal: Emotional status will improve Outcome: Progressing Goal: Mental status will improve Outcome: Progressing Goal: Verbalization of understanding the information provided will improve Outcome: Progressing   Problem: Activity: Goal: Sleeping patterns will improve Outcome: Progressing   Problem: Safety: Goal: Periods of time without injury will increase Outcome: Progressing

## 2023-04-22 NOTE — Group Note (Signed)
Date:  04/22/2023 Time:  8:43 PM  Group Topic/Focus:  Coping With Mental Health Crisis:   The purpose of this group is to help patients identify strategies for coping with mental health crisis.  Group discusses possible causes of crisis and ways to manage them effectively.    Participation Level:  Minimal  Participation Quality:  Appropriate  Affect:  Appropriate  Cognitive:  Appropriate  Insight: Good  Engagement in Group:  Limited  Modes of Intervention:  Discussion   Lenore Cordia 04/22/2023, 8:43 PM

## 2023-04-22 NOTE — Progress Notes (Signed)
[facility] MD Progress Note  04/22/2023 6:58 AM Albert [name]  MRN:  [id number]  Subjective:  Notes, labs, and vital signs reviewed; progression meeting completed.  Albert [name] is noted to be in bed, resting comfortably- does not appear restless at this time. Albert [name] continues to report high depression and anxiety, but reports symptoms have improved "maybe a little".  Anxiety is high with no panic attacks.  Sleep is "pretty good", appetite is "ok" . During rounds, he reported "I have akathisia from whatever medicine they added, do you know what that is?". He reports feeling jittery and restless. Denies any other complaints currently.  The only medication that was started was gabapentin.  His mother stated he tends to have opposite effects from medications.  Discussed options and because the Ativan did work for him, Klonopin ordered to assist with his symptoms of intrusive memories and akithisia.    Collateral from his mother, Albert [name]: Therapeutic communication with listening provided as we discussed his care including his medications.  Principal Problem: Bipolar disorder, current episode depressed, severe, without psychotic features (HCC) Diagnosis: Principal Problem:   Bipolar disorder, current episode depressed, severe, without psychotic features (HCC) Active Problems:   Obsessive-compulsive disorder  Total Time spent with patient: 30 minutes  Past Psychiatric History: bipolar d/o, depression, anxiety, ADHD  Past Medical History:  Past Medical History:  Diagnosis Date   ADHD (attention deficit hyperactivity disorder)    Anemia    Anxiety    Bipolar disorder (HCC)    BPH (benign prostatic hyperplasia)    Chronic kidney disease, stage 3b (HCC)    Constipation    DDD (degenerative disc disease), cervical    Depression    Deviated septum    ED (erectile dysfunction)    GERD (gastroesophageal reflux disease)    Graves disease    History of hiatal hernia    Hypertension    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    Hypothyroidism    Pneumonia    PONV (postoperative nausea and vomiting)     Past Surgical History:  Procedure Laterality Date   BACK SURGERY     lumbar disc   COLONOSCOPY N/A 12/23/2022   Procedure: COLONOSCOPY;  Surgeon: Jaynie Collins, DO;  Location: [facility] ENDOSCOPY;  Service: Gastroenterology;  Laterality: N/A;   colonoscopyx2 N/A    2003, 2016   ESOPHAGOGASTRODUODENOSCOPY     1998, 2002, 2003, 2016   ESOPHAGOGASTRODUODENOSCOPY (EGD) WITH PROPOFOL N/A 09/12/2020   Procedure: ESOPHAGOGASTRODUODENOSCOPY (EGD) WITH PROPOFOL;  Surgeon: Earline Mayotte, MD;  Location: [facility] ENDOSCOPY;  Service: Endoscopy;  Laterality: N/A;  1ST CASE   EYE SURGERY  2006   strabismus surgery   HERNIA REPAIR     Hiatal Hernia   INSERTION OF MESH Bilateral 03/03/2021   Procedure: INSERTION OF MESH;  Surgeon: Leafy Ro, MD;  Location: [facility] ORS;  Service: General;  Laterality: Bilateral;   NASAL SEPTUM SURGERY     NM I- 131 THERAPY FOR ABLATION ([facility] HX)  04/11/2015   thyroid   SPINE SURGERY     XI ROBOTIC ASSISTED PARAESOPHAGEAL HERNIA REPAIR N/A 10/23/2020   Procedure: XI ROBOTIC ASSISTED PARAESOPHAGEAL HERNIA REPAIR WITH MESH AND INCISIONAL HERNIA REPAIR WITH MESH;  Surgeon: Leafy Ro, MD;  Location: [facility] ORS;  Service: General;  Laterality: N/A;   Family History:  Family History  Problem Relation Age of Onset   Prostate cancer Neg Hx    Bladder Cancer Neg Hx    Kidney  cancer Neg Hx    Family Psychiatric  History: none Social History:  Social History   Substance and Sexual Activity  Alcohol Use Not Currently     Social History   Substance and Sexual Activity  Drug Use Never    Social History   Socioeconomic History   Marital status: Single    Spouse name: Not on file   Number of children: Not on file   Years of education: Not on file   Highest education level: Not on file  Occupational History   Not on file  Tobacco Use   Smoking  status: Former    Current packs/day: 0.00    Types: Cigarettes    Quit date: 2010    Years since quitting: 14.9    Passive exposure: Past   Smokeless tobacco: Former    Types: Associate Professor status: Never Used  Substance and Sexual Activity   Alcohol use: Not Currently   Drug use: Never   Sexual activity: Yes  Other Topics Concern   Not on file  Social History Narrative   Not on file   Social Drivers of Health   Financial Resource Strain: Low Risk  (11/03/2022)   Received from [facility], [facility]   Overall Financial Resource Strain (CARDIA)    Difficulty of Paying Living Expenses: Not very hard  Food Insecurity: No Food Insecurity (04/17/2023)   Hunger Vital Sign    Worried About Running Out of Food in the Last Year: Never true    Ran Out of Food in the Last Year: Never true  Transportation Needs: No Transportation Needs (04/17/2023)   PRAPARE - Administrator, Civil Service (Medical): No    Lack of Transportation (Non-Medical): No  Physical Activity: Not on file  Stress: Stress Concern Present (08/12/2020)   Received from [facility], [facility] - Occupational Stress Questionnaire    Feeling of Stress : Very much  Social Connections: Unknown (09/01/2021)   Received from [facility], [facility]   Social Network    Social Network: Not on file   Additional Social History: lives along, currently staying with his parents     Sleep: Fair  Appetite:  Fair  Current Medications: Current Facility-Administered Medications  Medication Dose Route Frequency Provider Last Rate Last Admin   acetaminophen (TYLENOL) tablet 650 mg  650 mg Oral Q6H PRN Jearld Lesch, NP       alum & mag hydroxide-simeth (MAALOX/MYLANTA) 200-200-20 MG/5ML suspension 30 mL  30 mL Oral Q4H PRN Dixon, Rashaun M, NP       cholecalciferol (VITAMIN D3) 25 MCG (1000 UNIT) tablet 1,000 Units   1,000 Units Oral Daily Charm Rings, NP   1,000 Units at 04/21/23 0825   cloZAPine (CLOZARIL) tablet 300 mg  300 mg Oral QHS Lewanda Rife, MD   300 mg at 04/21/23 2147   cyanocobalamin (VITAMIN B12) tablet 1,000 mcg  1,000 mcg Oral Daily Charm Rings, NP   1,000 mcg at 04/21/23 0825   haloperidol (HALDOL) tablet 5 mg  5 mg Oral TID PRN Jearld Lesch, NP   5 mg at 04/18/23 2243   And   diphenhydrAMINE (BENADRYL) capsule 50 mg  50 mg Oral TID PRN Jearld Lesch, NP   50 mg at 04/18/23 2242   haloperidol lactate (HALDOL) injection 5 mg  5 mg Intramuscular TID PRN Jearld Lesch,  NP       And   diphenhydrAMINE (BENADRYL) injection 50 mg  50 mg Intramuscular TID PRN Jearld Lesch, NP       And   LORazepam (ATIVAN) injection 2 mg  2 mg Intramuscular TID PRN Jearld Lesch, NP       haloperidol lactate (HALDOL) injection 10 mg  10 mg Intramuscular TID PRN Jearld Lesch, NP       And   diphenhydrAMINE (BENADRYL) injection 50 mg  50 mg Intramuscular TID PRN Jearld Lesch, NP       And   LORazepam (ATIVAN) injection 2 mg  2 mg Intramuscular TID PRN Jearld Lesch, NP       fenofibrate tablet 54 mg  54 mg Oral Daily Lewanda Rife, MD   54 mg at 04/21/23 0825   gabapentin (NEURONTIN) capsule 100 mg  100 mg Oral BID Charm Rings, NP   100 mg at 04/21/23 1658   hydrOXYzine (ATARAX) tablet 25 mg  25 mg Oral TID PRN Jearld Lesch, NP   25 mg at 04/21/23 1143   levETIRAcetam (KEPPRA) tablet 500 mg  500 mg Oral BID Lewanda Rife, MD   500 mg at 04/21/23 1659   levothyroxine (SYNTHROID) tablet 75 mcg  75 mcg Oral QAC breakfast Lewanda Rife, MD   75 mcg at 04/22/23 0602   lithium carbonate capsule 150 mg  150 mg Oral QHS Charm Rings, NP   150 mg at 04/21/23 2146   lubiprostone (AMITIZA) capsule 24 mcg  24 mcg Oral BID WC Charm Rings, NP   24 mcg at 04/21/23 2147   magnesium hydroxide (MILK OF MAGNESIA) suspension 30 mL  30 mL Oral Daily PRN Jearld Lesch, NP   30 mL at 04/20/23 0854   metFORMIN (GLUCOPHAGE-XR) 24 hr tablet 1,000 mg  1,000 mg Oral Q supper Charm Rings, NP   1,000 mg at 04/21/23 1658   pantoprazole (PROTONIX) EC tablet 80 mg  80 mg Oral Q1200 Charm Rings, NP   80 mg at 04/21/23 1143   polyethylene glycol (MIRALAX / GLYCOLAX) packet 17 g  17 g Oral Daily Charm Rings, NP   17 g at 04/21/23 0825   risperiDONE (RISPERDAL) tablet 2 mg  2 mg Oral QHS Charm Rings, NP   2 mg at 04/21/23 2147   tamsulosin (FLOMAX) capsule 0.4 mg  0.4 mg Oral Daily Lewanda Rife, MD   0.4 mg at 04/21/23 0825   traZODone (DESYREL) tablet 50 mg  50 mg Oral QHS PRN Jearld Lesch, NP   50 mg at 04/21/23 2225    Lab Results:  Results for orders placed or performed during the hospital encounter of 04/17/23 (from the past 48 hours)  CBC with Differential/Platelet     Status: Abnormal   Collection Time: 04/21/23  9:46 AM  Result Value Ref Range   WBC 6.5 4.0 - 10.5 K/uL   RBC 3.92 (L) 4.22 - 5.81 MIL/uL   Hemoglobin 12.1 (L) 13.0 - 17.0 g/dL   HCT 16.1 (L) 09.6 - 04.5 %   MCV 94.4 80.0 - 100.0 fL   MCH 30.9 26.0 - 34.0 pg   MCHC 32.7 30.0 - 36.0 g/dL   RDW 40.9 81.1 - 91.4 %   Platelets 402 (H) 150 - 400 K/uL   nRBC 0.0 0.0 - 0.2 %   Neutrophils Relative % 62 %   Neutro Abs 4.1 1.7 -  7.7 K/uL   Lymphocytes Relative 25 %   Lymphs Abs 1.6 0.7 - 4.0 K/uL   Monocytes Relative 7 %   Monocytes Absolute 0.4 0.1 - 1.0 K/uL   Eosinophils Relative 5 %   Eosinophils Absolute 0.3 0.0 - 0.5 K/uL   Basophils Relative 1 %   Basophils Absolute 0.1 0.0 - 0.1 K/uL   Immature Granulocytes 0 %   Abs Immature Granulocytes 0.02 0.00 - 0.07 K/uL    Comment: Performed at [facility], [address] [place], [place] [address]    Blood Alcohol level:  Lab Results  Component Value Date   [facility] <10 04/14/2023   ETH <10 08/01/2022    Metabolic Disorder Labs: Lab Results  Component Value Date   HGBA1C 5.8 (H) 04/19/2023   MPG  119.76 04/19/2023   MPG 108.28 08/12/2022   No results found for: "PROLACTIN" Lab Results  Component Value Date   CHOL 210 (H) 04/19/2023   TRIG 143 04/19/2023   HDL 81 04/19/2023   CHOLHDL 2.6 04/19/2023   VLDL 29 04/19/2023   LDLCALC 100 (H) 04/19/2023   LDLCALC 66 08/12/2022     Musculoskeletal: Strength & Muscle Tone: within normal limits Gait & Station: normal Patient leans: N/A  Psychiatric Specialty Exam: Physical Exam Vitals and nursing note reviewed.  Constitutional:      Appearance: Normal appearance.  HENT:     Head: Normocephalic.     Nose: Nose normal.  Pulmonary:     Effort: Pulmonary effort is normal.  Musculoskeletal:        General: Normal range of motion.     Cervical back: Normal range of motion.  Neurological:     General: No focal deficit present.     Mental Status: He is alert and oriented to person, place, and time.     Review of Systems  Psychiatric/Behavioral:  Positive for depression and suicidal ideas. The patient is nervous/anxious.   All other systems reviewed and are negative.   Blood pressure (!) 113/97, pulse 91, temperature 98 F (36.7 C), resp. rate 18, height 5\' 9"  (1.753 m), weight 69 kg, SpO2 99%.Body mass index is 22.46 kg/m.  General Appearance: Casual  Eye Contact:  Good  Speech:  Normal Rate  Volume:  Normal  Mood:  Anxious and Depressed  Affect:  Congruent  Thought Process:  Coherent  Orientation:  Full (Time, Place, and Person)  Thought Content:  Rumination  Suicidal Thoughts:  Yes.  without intent/plan  Homicidal Thoughts:  No  Memory:  Immediate;   Good Recent;   Good Remote;   Good  Judgement:  Fair  Insight:  Fair  Psychomotor Activity:  Normal  Concentration:  Concentration: Good and Attention Span: Good  Recall:  Good  Fund of Knowledge:  Good  Language:  Good  Akathisia:  No  Handed:  Right  AIMS (if indicated):     Assets:  Housing Leisure Time Resilience Social Support  ADL's:  Intact   Cognition:  WNL  Sleep:         Physical Exam: Physical Exam Vitals and nursing note reviewed.  Constitutional:      Appearance: Normal appearance.  HENT:     Head: Normocephalic.     Nose: Nose normal.  Pulmonary:     Effort: Pulmonary effort is normal.  Musculoskeletal:        General: Normal range of motion.     Cervical back: Normal range of motion.  Neurological:  General: No focal deficit present.     Mental Status: He is alert and oriented to person, place, and time.    Review of Systems  Psychiatric/Behavioral:  Positive for depression and suicidal ideas. The patient is nervous/anxious.   All other systems reviewed and are negative.  Blood pressure (!) 113/97, pulse 91, temperature 98 F (36.7 C), resp. rate 18, height 5\' 9"  (1.753 m), weight 69 kg, SpO2 99%. Body mass index is 22.46 kg/m.   Treatment Plan Summary: Daily contact with patient to assess and evaluate symptoms and progress in treatment, Medication management, and Plan : Bipolar affective disorder, depressed, severe without psychosis: Clozaril 300 mg daily, Clozaril blood testing Luvox 50 mg daily discontinued as he was started and two days later had a seizure which can occur with Luvox, typically in therapeutic doses.  However, his mood stabilization medications are being reduced per his psychiatrist, Dr. Jennelle Human, and the Luvox could potentially trigger a manic episode. Risperdal 2 mg BID decreased to 2 mg at bedtime as Dr. Jennelle Human his regular psychiatrist is tapering him off of this medication  His mother is in agreement along with the patient.  Anxiety: Hydroxyzine 25 mg TID PRN Discontinued gabapentin 100 mg BID Klonopin 0.5 mg BID started  Nanine Means, NP 04/22/2023, 6:58 AM

## 2023-04-22 NOTE — Progress Notes (Signed)
   04/22/23 1800  Psych Admission Type (Psych Patients Only)  Admission Status Voluntary  Psychosocial Assessment  Patient Complaints Anxiety  Eye Contact Fair  Facial Expression Sad;Anxious  Affect Flat  Speech Logical/coherent  Interaction Assertive  Motor Activity Slow  Appearance/Hygiene In scrubs  Behavior Characteristics Cooperative  Mood Depressed  Aggressive Behavior  Effect No apparent injury  Thought Process  Coherency WDL  Content WDL  Delusions None reported or observed  Perception WDL  Hallucination None reported or observed  Judgment Impaired  Confusion WDL  Danger to Self  Current suicidal ideation? Passive  Agreement Not to Harm Self Yes  Description of Agreement Verbal

## 2023-04-23 DIAGNOSIS — F314 Bipolar disorder, current episode depressed, severe, without psychotic features: Secondary | ICD-10-CM | POA: Diagnosis not present

## 2023-04-23 MED ORDER — CYANOCOBALAMIN 1000 MCG/ML IJ SOLN
1000.0000 ug | Freq: Once | INTRAMUSCULAR | Status: AC
  Administered 2023-04-23: 1000 ug via INTRAMUSCULAR
  Filled 2023-04-23: qty 1

## 2023-04-23 NOTE — Progress Notes (Signed)
[facility] MD Progress Note  04/23/2023 6:43 AM KAIDENCE HOYLMAN  MRN:  [id number]  Subjective:  Notes, labs, and vital signs reviewed; progression meeting completed.  Upon assessment, Albert [name] is noted to be lying in bed, resting comfortably- does not appear restless or in any distress at this time. Dix reported moderate depression and mild anxiety, but reports symptoms have improved from when he first arrived. He stated his sleep pattern is "pretty good", appetite is "ok" . He denies auditory/visual hallucinations and stated his suicidal ideations, "come and go, none right now", consistent with his intrusive thoughts.. Yesterday gabapentin was discontinued and klonopin was started. Patient denies experiencing any side effects from his medications.   Collateral from his mother, Colten Hutchings: She feels he is better and his brother indicated he felt he was ready to return home. On assessment, Bismark did feel his symptoms would improve from being at home in his own environment.    Clozaril labs still not back, [facility] lab called and they provided [facility] number of [phone] who contacted Crystal with hospital services.  She stated she would try and get this competed.  Labs need to be obtained prior to giving a Clozaril Rx which would prevent discharge.  Explained this to the service so they were aware of the urgency.  Principal Problem: Bipolar disorder, current episode depressed, severe, without psychotic features (HCC) Diagnosis: Principal Problem:   Bipolar disorder, current episode depressed, severe, without psychotic features (HCC) Active Problems:   Obsessive-compulsive disorder  Total Time spent with patient: 30 minutes  Past Psychiatric History: bipolar d/o, depression, anxiety, ADHD  Past Medical History:  Past Medical History:  Diagnosis Date   ADHD (attention deficit hyperactivity disorder)    Anemia    Anxiety    Bipolar disorder (HCC)    BPH (benign prostatic hyperplasia)    Chronic kidney  disease, stage 3b (HCC)    Constipation    DDD (degenerative disc disease), cervical    Depression    Deviated septum    ED (erectile dysfunction)    GERD (gastroesophageal reflux disease)    Graves disease    History of hiatal hernia    Hypertension    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    Hypothyroidism    Pneumonia    PONV (postoperative nausea and vomiting)     Past Surgical History:  Procedure Laterality Date   BACK SURGERY     lumbar disc   COLONOSCOPY N/A 12/23/2022   Procedure: COLONOSCOPY;  Surgeon: Jaynie Collins, DO;  Location: [facility] ENDOSCOPY;  Service: Gastroenterology;  Laterality: N/A;   colonoscopyx2 N/A    2003, 2016   ESOPHAGOGASTRODUODENOSCOPY     1998, 2002, 2003, 2016   ESOPHAGOGASTRODUODENOSCOPY (EGD) WITH PROPOFOL N/A 09/12/2020   Procedure: ESOPHAGOGASTRODUODENOSCOPY (EGD) WITH PROPOFOL;  Surgeon: Earline Mayotte, MD;  Location: [facility] ENDOSCOPY;  Service: Endoscopy;  Laterality: N/A;  1ST CASE   EYE SURGERY  2006   strabismus surgery   HERNIA REPAIR     Hiatal Hernia   INSERTION OF MESH Bilateral 03/03/2021   Procedure: INSERTION OF MESH;  Surgeon: Leafy Ro, MD;  Location: [facility] ORS;  Service: General;  Laterality: Bilateral;   NASAL SEPTUM SURGERY     NM I- 131 THERAPY FOR ABLATION ([facility] HX)  04/11/2015   thyroid   SPINE SURGERY     XI ROBOTIC ASSISTED PARAESOPHAGEAL HERNIA REPAIR N/A 10/23/2020   Procedure: XI ROBOTIC ASSISTED PARAESOPHAGEAL HERNIA REPAIR WITH MESH AND  INCISIONAL HERNIA REPAIR WITH MESH;  Surgeon: Leafy Ro, MD;  Location: [facility] ORS;  Service: General;  Laterality: N/A;   Family History:  Family History  Problem Relation Age of Onset   Prostate cancer Neg Hx    Bladder Cancer Neg Hx    Kidney cancer Neg Hx    Family Psychiatric  History: none Social History:  Social History   Substance and Sexual Activity  Alcohol Use Not Currently     Social History   Substance and Sexual  Activity  Drug Use Never    Social History   Socioeconomic History   Marital status: Single    Spouse name: Not on file   Number of children: Not on file   Years of education: Not on file   Highest education level: Not on file  Occupational History   Not on file  Tobacco Use   Smoking status: Former    Current packs/day: 0.00    Types: Cigarettes    Quit date: 2010    Years since quitting: 14.9    Passive exposure: Past   Smokeless tobacco: Former    Types: Associate Professor status: Never Used  Substance and Sexual Activity   Alcohol use: Not Currently   Drug use: Never   Sexual activity: Yes  Other Topics Concern   Not on file  Social History Narrative   Not on file   Social Drivers of Health   Financial Resource Strain: Low Risk  (11/03/2022)   Received [facility], [facility]   Overall Financial Resource Strain (CARDIA)    Difficulty of Paying Living Expenses: Not very hard  Food Insecurity: No Food Insecurity (04/17/2023)   Hunger Vital Sign    Worried About Running Out of Food in the Last Year: Never true    Ran Out of Food in the Last Year: Never true  Transportation Needs: No Transportation Needs (04/17/2023)   PRAPARE - Administrator, Civil Service (Medical): No    Lack of Transportation (Non-Medical): No  Physical Activity: Not on file  Stress: Stress Concern Present (08/12/2020)   Received from [facility], [facility] - Occupational Stress Questionnaire    Feeling of Stress : Very much  Social Connections: Unknown (09/01/2021)   Received from [facility], [facility]   Social Network    Social Network: Not on file   Additional Social History: lives along, currently staying with his parents     Sleep: Fair  Appetite:  Fair  Current Medications: Current Facility-Administered Medications  Medication Dose Route Frequency Provider Last  Rate Last Admin   acetaminophen (TYLENOL) tablet 650 mg  650 mg Oral Q6H PRN Jearld Lesch, NP       alum & mag hydroxide-simeth (MAALOX/MYLANTA) 200-200-20 MG/5ML suspension 30 mL  30 mL Oral Q4H PRN Dixon, Rashaun M, NP       cholecalciferol (VITAMIN D3) 25 MCG (1000 UNIT) tablet 1,000 Units  1,000 Units Oral Daily Charm Rings, NP   1,000 Units at 04/22/23 1610   clonazePAM (KLONOPIN) tablet 0.5 mg  0.5 mg Oral BID Charm Rings, NP   0.5 mg at 04/22/23 1654   cloZAPine (CLOZARIL) tablet 300 mg  300 mg Oral QHS Lewanda Rife, MD   300 mg at 04/22/23 2058   cyanocobalamin (VITAMIN B12) tablet 1,000 mcg  1,000 mcg Oral Daily Charm Rings, NP  1,000 mcg at 04/22/23 3016   haloperidol (HALDOL) tablet 5 mg  5 mg Oral TID PRN Jearld Lesch, NP   5 mg at 04/18/23 2243   And   diphenhydrAMINE (BENADRYL) capsule 50 mg  50 mg Oral TID PRN Jearld Lesch, NP   50 mg at 04/22/23 2058   haloperidol lactate (HALDOL) injection 5 mg  5 mg Intramuscular TID PRN Jearld Lesch, NP       And   diphenhydrAMINE (BENADRYL) injection 50 mg  50 mg Intramuscular TID PRN Jearld Lesch, NP       And   LORazepam (ATIVAN) injection 2 mg  2 mg Intramuscular TID PRN Jearld Lesch, NP       haloperidol lactate (HALDOL) injection 10 mg  10 mg Intramuscular TID PRN Jearld Lesch, NP       And   diphenhydrAMINE (BENADRYL) injection 50 mg  50 mg Intramuscular TID PRN Jearld Lesch, NP       And   LORazepam (ATIVAN) injection 2 mg  2 mg Intramuscular TID PRN Jearld Lesch, NP       fenofibrate tablet 54 mg  54 mg Oral Daily Lewanda Rife, MD   54 mg at 04/22/23 0109   hydrOXYzine (ATARAX) tablet 25 mg  25 mg Oral TID PRN Jearld Lesch, NP   25 mg at 04/22/23 2059   levETIRAcetam (KEPPRA) tablet 500 mg  500 mg Oral BID Lewanda Rife, MD   500 mg at 04/22/23 1654   levothyroxine (SYNTHROID) tablet 75 mcg  75 mcg Oral QAC breakfast Lewanda Rife, MD   75 mcg at 04/23/23 3235    lithium carbonate capsule 150 mg  150 mg Oral QHS Charm Rings, NP   150 mg at 04/22/23 2059   lubiprostone (AMITIZA) capsule 24 mcg  24 mcg Oral BID WC Charm Rings, NP   24 mcg at 04/22/23 2059   magnesium hydroxide (MILK OF MAGNESIA) suspension 30 mL  30 mL Oral Daily PRN Jearld Lesch, NP   30 mL at 04/20/23 0854   metFORMIN (GLUCOPHAGE-XR) 24 hr tablet 1,000 mg  1,000 mg Oral Q supper Charm Rings, NP   1,000 mg at 04/22/23 1655   pantoprazole (PROTONIX) EC tablet 80 mg  80 mg Oral Q1200 Charm Rings, NP   80 mg at 04/22/23 1257   polyethylene glycol (MIRALAX / GLYCOLAX) packet 17 g  17 g Oral Daily Charm Rings, NP   17 g at 04/22/23 0856   risperiDONE (RISPERDAL) tablet 2 mg  2 mg Oral QHS Charm Rings, NP   2 mg at 04/22/23 2059   tamsulosin (FLOMAX) capsule 0.4 mg  0.4 mg Oral Daily Lewanda Rife, MD   0.4 mg at 04/22/23 5732   traZODone (DESYREL) tablet 50 mg  50 mg Oral QHS PRN Jearld Lesch, NP   50 mg at 04/21/23 2225    Lab Results:  Results for orders placed or performed during the hospital encounter of 04/17/23 (from the past 48 hours)  CBC with Differential/Platelet     Status: Abnormal   Collection Time: 04/21/23  9:46 AM  Result Value Ref Range   WBC 6.5 4.0 - 10.5 K/uL   RBC 3.92 (L) 4.22 - 5.81 MIL/uL   Hemoglobin 12.1 (L) 13.0 - 17.0 g/dL   HCT 20.2 (L) 54.2 - 70.6 %   MCV 94.4 80.0 - 100.0 fL   MCH 30.9 26.0 -  34.0 pg   MCHC 32.7 30.0 - 36.0 g/dL   RDW 29.5 62.1 - 30.8 %   Platelets 402 (H) 150 - 400 K/uL   nRBC 0.0 0.0 - 0.2 %   Neutrophils Relative % 62 %   Neutro Abs 4.1 1.7 - 7.7 K/uL   Lymphocytes Relative 25 %   Lymphs Abs 1.6 0.7 - 4.0 K/uL   Monocytes Relative 7 %   Monocytes Absolute 0.4 0.1 - 1.0 K/uL   Eosinophils Relative 5 %   Eosinophils Absolute 0.3 0.0 - 0.5 K/uL   Basophils Relative 1 %   Basophils Absolute 0.1 0.0 - 0.1 K/uL   Immature Granulocytes 0 %   Abs Immature Granulocytes 0.02 0.00 - 0.07 K/uL     Comment: Performed at [facility], [address] [place], [place] [address]    Blood Alcohol level:  Lab Results  Component Value Date   [facility] <10 04/14/2023   ETH <10 08/01/2022    Metabolic Disorder Labs: Lab Results  Component Value Date   HGBA1C 5.8 (H) 04/19/2023   MPG 119.76 04/19/2023   MPG 108.28 08/12/2022   No results found for: "PROLACTIN" Lab Results  Component Value Date   CHOL 210 (H) 04/19/2023   TRIG 143 04/19/2023   HDL 81 04/19/2023   CHOLHDL 2.6 04/19/2023   VLDL 29 04/19/2023   LDLCALC 100 (H) 04/19/2023   LDLCALC 66 08/12/2022     Musculoskeletal: Strength & Muscle Tone: within normal limits Gait & Station: normal Patient leans: N/A  Psychiatric Specialty Exam: Physical Exam Vitals and nursing note reviewed.  Constitutional:      Appearance: Normal appearance.  HENT:     Head: Normocephalic.     Nose: Nose normal.  Pulmonary:     Effort: Pulmonary effort is normal.  Musculoskeletal:        General: Normal range of motion.     Cervical back: Normal range of motion.  Neurological:     General: No focal deficit present.     Mental Status: He is alert and oriented to person, place, and time.     Review of Systems  Psychiatric/Behavioral:  Positive for depression and suicidal ideas. The patient is nervous/anxious.   All other systems reviewed and are negative.   Blood pressure 136/85, pulse 93, temperature 98 F (36.7 C), resp. rate 18, height 5\' 9"  (1.753 m), weight 69 kg, SpO2 99%.Body mass index is 22.46 kg/m.  General Appearance: Casual  Eye Contact:  Good  Speech:  Normal Rate  Volume:  Normal  Mood:  Anxious and Depressed  Affect:  Congruent  Thought Process:  Coherent  Orientation:  Full (Time, Place, and Person)  Thought Content:  Rumination  Suicidal Thoughts:  Yes.  without intent/plan  Homicidal Thoughts:  No  Memory:  Immediate;   Good Recent;   Good Remote;   Good  Judgement:  Fair  Insight:  Fair   Psychomotor Activity:  Normal  Concentration:  Concentration: Good and Attention Span: Good  Recall:  Good  Fund of Knowledge:  Good  Language:  Good  Akathisia:  No  Handed:  Right  AIMS (if indicated):     Assets:  Housing Leisure Time Resilience Social Support  ADL's:  Intact  Cognition:  WNL  Sleep:         Physical Exam: Physical Exam Vitals and nursing note reviewed.  Constitutional:      Appearance: Normal appearance.  HENT:     Head:  Normocephalic.     Nose: Nose normal.  Pulmonary:     Effort: Pulmonary effort is normal.  Musculoskeletal:        General: Normal range of motion.     Cervical back: Normal range of motion.  Neurological:     General: No focal deficit present.     Mental Status: He is alert and oriented to person, place, and time.    Review of Systems  Psychiatric/Behavioral:  Positive for depression and suicidal ideas. The patient is nervous/anxious.   All other systems reviewed and are negative.  Blood pressure 136/85, pulse 93, temperature 98 F (36.7 C), resp. rate 18, height 5\' 9"  (1.753 m), weight 69 kg, SpO2 99%. Body mass index is 22.46 kg/m.   Treatment Plan Summary: Daily contact with patient to assess and evaluate symptoms and progress in treatment, Medication management, and Plan : Bipolar affective disorder, depressed, severe without psychosis: Clozaril 300 mg daily, Clozaril blood testing Luvox 50 mg daily discontinued as he was started and two days later had a seizure which can occur with Luvox, typically in therapeutic doses.  However, his mood stabilization medications are being reduced per his psychiatrist, Dr. Jennelle Human, and the Luvox could potentially trigger a manic episode. Risperdal 2 mg BID decreased to 2 mg at bedtime as Dr. Jennelle Human his regular psychiatrist is tapering him off of this medication  His mother is in agreement along with the patient.  Anxiety: Hydroxyzine 25 mg TID PRN Klonopin 0.5 mg BID   Nanine Means,  NP 04/23/2023, 6:44 AM

## 2023-04-23 NOTE — BH IP Treatment Plan (Signed)
Interdisciplinary Treatment and Diagnostic Plan Update  04/23/2023 Time of Session: 04/23/2023 Albert [name] MRN: [id number]  Principal Diagnosis: Bipolar disorder, current episode depressed, severe, without psychotic features (HCC)  Secondary Diagnoses: Principal Problem:   Bipolar disorder, current episode depressed, severe, without psychotic features (HCC) Active Problems:   Obsessive-compulsive disorder   Current Medications:  Current Facility-Administered Medications  Medication Dose Route Frequency Provider Last Rate Last Admin   acetaminophen (TYLENOL) tablet 650 mg  650 mg Oral Q6H PRN Dixon, Rashaun M, NP       alum & mag hydroxide-simeth (MAALOX/MYLANTA) 200-200-20 MG/5ML suspension 30 mL  30 mL Oral Q4H PRN Dixon, Rashaun M, NP       cholecalciferol (VITAMIN D3) 25 MCG (1000 UNIT) tablet 1,000 Units  1,000 Units Oral Daily Charm Rings, NP   1,000 Units at 04/23/23 7893   clonazePAM (KLONOPIN) tablet 0.5 mg  0.5 mg Oral BID Charm Rings, NP   0.5 mg at 04/23/23 8101   cloZAPine (CLOZARIL) tablet 300 mg  300 mg Oral QHS Lewanda Rife, MD   300 mg at 04/22/23 2058   cyanocobalamin (VITAMIN B12) injection 1,000 mcg  1,000 mcg Intramuscular Once Charm Rings, NP       haloperidol (HALDOL) tablet 5 mg  5 mg Oral TID PRN Jearld Lesch, NP   5 mg at 04/18/23 2243   And   diphenhydrAMINE (BENADRYL) capsule 50 mg  50 mg Oral TID PRN Jearld Lesch, NP   50 mg at 04/22/23 2058   haloperidol lactate (HALDOL) injection 5 mg  5 mg Intramuscular TID PRN Jearld Lesch, NP       And   diphenhydrAMINE (BENADRYL) injection 50 mg  50 mg Intramuscular TID PRN Jearld Lesch, NP       And   LORazepam (ATIVAN) injection 2 mg  2 mg Intramuscular TID PRN Jearld Lesch, NP       haloperidol lactate (HALDOL) injection 10 mg  10 mg Intramuscular TID PRN Jearld Lesch, NP       And   diphenhydrAMINE (BENADRYL) injection 50 mg  50 mg Intramuscular TID PRN Jearld Lesch, NP        And   LORazepam (ATIVAN) injection 2 mg  2 mg Intramuscular TID PRN Dixon, Rashaun M, NP       fenofibrate tablet 54 mg  54 mg Oral Daily Lewanda Rife, MD   54 mg at 04/23/23 7510   hydrOXYzine (ATARAX) tablet 25 mg  25 mg Oral TID PRN Jearld Lesch, NP   25 mg at 04/22/23 2059   levETIRAcetam (KEPPRA) tablet 500 mg  500 mg Oral BID Lewanda Rife, MD   500 mg at 04/23/23 2585   levothyroxine (SYNTHROID) tablet 75 mcg  75 mcg Oral QAC breakfast Lewanda Rife, MD   75 mcg at 04/23/23 2778   lithium carbonate capsule 150 mg  150 mg Oral QHS Charm Rings, NP   150 mg at 04/22/23 2059   lubiprostone (AMITIZA) capsule 24 mcg  24 mcg Oral BID WC Charm Rings, NP   24 mcg at 04/23/23 0905   magnesium hydroxide (MILK OF MAGNESIA) suspension 30 mL  30 mL Oral Daily PRN Lerry Liner M, NP   30 mL at 04/20/23 0854   metFORMIN (GLUCOPHAGE-XR) 24 hr tablet 1,000 mg  1,000 mg Oral Q supper Charm Rings, NP   1,000 mg at 04/22/23 1655   pantoprazole (PROTONIX) EC tablet  80 mg  80 mg Oral Q1200 Charm Rings, NP   80 mg at 04/22/23 1257   polyethylene glycol (MIRALAX / GLYCOLAX) packet 17 g  17 g Oral Daily Charm Rings, NP   17 g at 04/23/23 1610   risperiDONE (RISPERDAL) tablet 2 mg  2 mg Oral QHS Charm Rings, NP   2 mg at 04/22/23 2059   tamsulosin (FLOMAX) capsule 0.4 mg  0.4 mg Oral Daily Lewanda Rife, MD   0.4 mg at 04/23/23 9604   traZODone (DESYREL) tablet 50 mg  50 mg Oral QHS PRN Jearld Lesch, NP   50 mg at 04/21/23 2225   PTA Medications: Medications Prior to Admission  Medication Sig Dispense Refill Last Dose/Taking   acetaminophen (TYLENOL) 500 MG tablet Take 1,000 mg by mouth every 6 (six) hours as needed for moderate pain.      atropine 1 % ophthalmic solution Place 2 drops under the tongue 3 (three) times daily as needed. 2 mL 1    cholecalciferol (VITAMIN D3) 25 MCG (1000 UNIT) tablet Take 1,000 Units by mouth daily.      cloZAPine (CLOZARIL)  100 MG tablet Take 3 tablets (300 mg total) by mouth at bedtime.      Cyanocobalamin (VITAMIN B-12 IJ) Inject as directed every 30 (thirty) days.      esomeprazole (NEXIUM) 40 MG capsule Take 40 mg by mouth daily.      fenofibrate (TRICOR) 145 MG tablet Take 145 mg by mouth daily.      fluvoxaMINE (LUVOX) 100 MG tablet 1/2 tablet in the morning for 1 week, then 1 tablet each morning 30 tablet 0    levETIRAcetam (KEPPRA) 500 MG tablet Take 1 tablet (500 mg total) by mouth 2 (two) times daily. 60 tablet 0    levothyroxine (SYNTHROID, LEVOTHROID) 75 MCG tablet Take 75 mcg by mouth daily before breakfast.      lithium carbonate 150 MG capsule TAKE 1 CAPSULE BY MOUTH AT BEDTIME 30 capsule 0    LORazepam (ATIVAN) 0.5 MG tablet TAKE ONE TABLET BY MOUTH FOUR TIMES DAILY 120 tablet 0    lubiprostone (AMITIZA) 24 MCG capsule Take 24 mcg by mouth 2 (two) times daily with a meal.      metFORMIN (GLUCOPHAGE-XR) 500 MG 24 hr tablet Take 500 mg by mouth 2 (two) times daily with a meal.      risperidone (RISPERDAL) 4 MG tablet TAKE 1 TABLET BY MOUTH TWICE DAILY (Patient taking differently: Take 4 mg by mouth daily.) 60 tablet 0    tamsulosin (FLOMAX) 0.4 MG CAPS capsule Take 1 capsule (0.4 mg total) by mouth daily. 90 capsule 3     Patient Stressors: Medication change or noncompliance    Patient Strengths: Motivation for treatment/growth  Supportive family/friends   Treatment Modalities: Medication Management, Group therapy, Case management,  1 to 1 session with clinician, Psychoeducation, Recreational therapy.   Physician Treatment Plan for Primary Diagnosis: Bipolar disorder, current episode depressed, severe, without psychotic features (HCC) Long Term Goal(s): Improvement in symptoms so as ready for discharge   Short Term Goals: Ability to identify changes in lifestyle to reduce recurrence of condition will improve Ability to verbalize feelings will improve Ability to disclose and discuss suicidal  ideas Ability to demonstrate self-control will improve Ability to identify and develop effective coping behaviors will improve Ability to maintain clinical measurements within normal limits will improve Compliance with prescribed medications will improve Ability to identify triggers associated with substance  abuse/mental health issues will improve  Medication Management: Evaluate patient's response, side effects, and tolerance of medication regimen.  Therapeutic Interventions: 1 to 1 sessions, Unit Group sessions and Medication administration.  Evaluation of Outcomes: Progressing  Physician Treatment Plan for Secondary Diagnosis: Principal Problem:   Bipolar disorder, current episode depressed, severe, without psychotic features (HCC) Active Problems:   Obsessive-compulsive disorder  Long Term Goal(s): Improvement in symptoms so as ready for discharge   Short Term Goals: Ability to identify changes in lifestyle to reduce recurrence of condition will improve Ability to verbalize feelings will improve Ability to disclose and discuss suicidal ideas Ability to demonstrate self-control will improve Ability to identify and develop effective coping behaviors will improve Ability to maintain clinical measurements within normal limits will improve Compliance with prescribed medications will improve Ability to identify triggers associated with substance abuse/mental health issues will improve     Medication Management: Evaluate patient's response, side effects, and tolerance of medication regimen.  Therapeutic Interventions: 1 to 1 sessions, Unit Group sessions and Medication administration.  Evaluation of Outcomes: Progressing   RN Treatment Plan for Primary Diagnosis: Bipolar disorder, current episode depressed, severe, without psychotic features (HCC) Long Term Goal(s): Knowledge of disease and therapeutic regimen to maintain health will improve  Short Term Goals: Ability to remain  free from injury will improve, Ability to verbalize frustration and anger appropriately will improve, Ability to demonstrate self-control, Ability to participate in decision making will improve, Ability to verbalize feelings will improve, Ability to disclose and discuss suicidal ideas, Ability to identify and develop effective coping behaviors will improve, and Compliance with prescribed medications will improve  Medication Management: RN will administer medications as ordered by provider, will assess and evaluate patient's response and provide education to patient for prescribed medication. RN will report any adverse and/or side effects to prescribing provider.  Therapeutic Interventions: 1 on 1 counseling sessions, Psychoeducation, Medication administration, Evaluate responses to treatment, Monitor vital signs and CBGs as ordered, Perform/monitor CIWA, COWS, AIMS and Fall Risk screenings as ordered, Perform wound care treatments as ordered.  Evaluation of Outcomes: Progressing   LCSW Treatment Plan for Primary Diagnosis: Bipolar disorder, current episode depressed, severe, without psychotic features (HCC) Long Term Goal(s): Safe transition to appropriate next level of care at discharge, Engage patient in therapeutic group addressing interpersonal concerns.  Short Term Goals: Engage patient in aftercare planning with referrals and resources, Increase social support, Increase ability to appropriately verbalize feelings, Increase emotional regulation, Facilitate acceptance of mental health diagnosis and concerns, Facilitate patient progression through stages of change regarding substance use diagnoses and concerns, Identify triggers associated with mental health/substance abuse issues, and Increase skills for wellness and recovery  Therapeutic Interventions: Assess for all discharge needs, 1 to 1 time with Social worker, Explore available resources and support systems, Assess for adequacy in community  support network, Educate family and significant other(s) on suicide prevention, Complete Psychosocial Assessment, Interpersonal group therapy.  Evaluation of Outcomes: Progressing   Progress in Treatment: Attending groups: Yes. Participating in groups: Yes. Taking medication as prescribed: Yes. Toleration medication: Yes. Family/Significant other contact made: Yes, individual(s) contacted:  SPE completed with pt 04/18/2023, as pt refused consent for family contact  Patient understands diagnosis: Yes. Discussing patient identified problems/goals with staff: Yes. Medical problems stabilized or resolved: Yes. Denies suicidal/homicidal ideation: Yes. Issues/concerns per patient self-inventory: Yes. Other:   New problem(s) identified: No, Describe:  none identified. Update: 04/23/2023: no changes at this time.  New Short Term/Long Term Goal(s): medication management  for mood stabilization; elimination of SI thoughts; development of comprehensive mental wellness/sobriety plan. Update: 04/23/2023: no changes at this time.    Patient Goals:  "To be better. I have suicidal thoughts that come and go." Update: 04/23/2023: no changes at this time.    Discharge Plan or Barriers: CSW will assist pt with development of an appropriate aftercare/discharge plan.  Update: 04/23/2023: no changes at this time.    Reason for Continuation of Hospitalization: Depression Medication stabilization Suicidal ideation. Update: 04/23/2023: no changes at this time.    Estimated Length of Stay: 1-7 days. Update: 04/23/2023: no changes at this time.    Last 3 [place] Suicide Severity Risk Score: Flowsheet Row Admission (Current) from 04/17/2023 in [facility] ED to Hosp-Admission (Discharged) from 04/14/2023 in [facility] 1C MEDICAL TELEMETRY Admission (Discharged) from 12/23/2022 in [facility]  C-SSRS RISK CATEGORY Low Risk No Risk No  Risk       Last PHQ 2/9 Scores:     No data to display          Scribe for Treatment Team: Rhett Bannister 04/23/2023 10:39 AM

## 2023-04-23 NOTE — Group Note (Signed)
Date:  04/23/2023 Time:  4:35 PM  Group Topic/Focus:  Goals Group:   The focus of this group is to help patients establish daily goals to achieve during treatment and discuss how the patient can incorporate goal setting into their daily lives to aide in recovery.    Participation Level:  Active  Participation Quality:  Appropriate and Redirectable  Affect:  Appropriate  Cognitive:  Appropriate  Insight: Appropriate  Engagement in Group:  Developing/Improving  Modes of Intervention:  Activity  Additional Comments:    Jun Osment 04/23/2023, 4:35 PM

## 2023-04-23 NOTE — Progress Notes (Deleted)
[facility] MD Progress Note  04/23/2023 11:20 AM MUATH SPROUL  MRN:  [id number]  Subjective:  Notes, labs, and vital signs reviewed; progression meeting completed.  Nore is noted to be in bed, resting comfortably- does not appear restless at this time. Arsalan continues to report high depression and anxiety, but reports symptoms have improved "maybe a little".  Anxiety is high with no panic attacks.  Sleep is "pretty good", appetite is "ok" . During rounds, he reported "I have akathisia from whatever medicine they added, do you know what that is?". He reports feeling jittery and restless. Denies any other complaints currently.  The only medication that was started was gabapentin.  His mother stated he tends to have opposite effects from medications.  Discussed options and because the Ativan did work for him, Klonopin ordered to assist with his symptoms of intrusive memories and akithisia.    Collateral from his mother, Davien Dragonetti: Therapeutic communication with listening provided as we discussed his care including his medications.  Principal Problem: Bipolar disorder, current episode depressed, severe, without psychotic features (HCC) Diagnosis: Principal Problem:   Bipolar disorder, current episode depressed, severe, without psychotic features (HCC) Active Problems:   Obsessive-compulsive disorder  Total Time spent with patient: 30 minutes  Past Psychiatric History: bipolar d/o, depression, anxiety, ADHD  Past Medical History:  Past Medical History:  Diagnosis Date   ADHD (attention deficit hyperactivity disorder)    Anemia    Anxiety    Bipolar disorder (HCC)    BPH (benign prostatic hyperplasia)    Chronic kidney disease, stage 3b (HCC)    Constipation    DDD (degenerative disc disease), cervical    Depression    Deviated septum    ED (erectile dysfunction)    GERD (gastroesophageal reflux disease)    Graves disease    History of hiatal hernia    Hypertension    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    Hypothyroidism    Pneumonia    PONV (postoperative nausea and vomiting)     Past Surgical History:  Procedure Laterality Date   BACK SURGERY     lumbar disc   COLONOSCOPY N/A 12/23/2022   Procedure: COLONOSCOPY;  Surgeon: Jaynie Collins, DO;  Location: [facility] ENDOSCOPY;  Service: Gastroenterology;  Laterality: N/A;   colonoscopyx2 N/A    2003, 2016   ESOPHAGOGASTRODUODENOSCOPY     1998, 2002, 2003, 2016   ESOPHAGOGASTRODUODENOSCOPY (EGD) WITH PROPOFOL N/A 09/12/2020   Procedure: ESOPHAGOGASTRODUODENOSCOPY (EGD) WITH PROPOFOL;  Surgeon: Earline Mayotte, MD;  Location: [facility] ENDOSCOPY;  Service: Endoscopy;  Laterality: N/A;  1ST CASE   EYE SURGERY  2006   strabismus surgery   HERNIA REPAIR     Hiatal Hernia   INSERTION OF MESH Bilateral 03/03/2021   Procedure: INSERTION OF MESH;  Surgeon: Leafy Ro, MD;  Location: [facility] ORS;  Service: General;  Laterality: Bilateral;   NASAL SEPTUM SURGERY     NM I- 131 THERAPY FOR ABLATION ([facility] HX)  04/11/2015   thyroid   SPINE SURGERY     XI ROBOTIC ASSISTED PARAESOPHAGEAL HERNIA REPAIR N/A 10/23/2020   Procedure: XI ROBOTIC ASSISTED PARAESOPHAGEAL HERNIA REPAIR WITH MESH AND INCISIONAL HERNIA REPAIR WITH MESH;  Surgeon: Leafy Ro, MD;  Location: [facility] ORS;  Service: General;  Laterality: N/A;   Family History:  Family History  Problem Relation Age of Onset   Prostate cancer Neg Hx    Bladder Cancer Neg Hx    Kidney  cancer Neg Hx    Family Psychiatric  History: none Social History:  Social History   Substance and Sexual Activity  Alcohol Use Not Currently     Social History   Substance and Sexual Activity  Drug Use Never    Social History   Socioeconomic History   Marital status: Single    Spouse name: Not on file   Number of children: Not on file   Years of education: Not on file   Highest education level: Not on file  Occupational History   Not on file  Tobacco Use   Smoking  status: Former    Current packs/day: 0.00    Types: Cigarettes    Quit date: 2010    Years since quitting: 14.9    Passive exposure: Past   Smokeless tobacco: Former    Types: Associate Professor status: Never Used  Substance and Sexual Activity   Alcohol use: Not Currently   Drug use: Never   Sexual activity: Yes  Other Topics Concern   Not on file  Social History Narrative   Not on file   Social Drivers of Health   Financial Resource Strain: Low Risk  (11/03/2022)   Received from [facility], [facility]   Overall Financial Resource Strain (CARDIA)    Difficulty of Paying Living Expenses: Not very hard  Food Insecurity: No Food Insecurity (04/17/2023)   Hunger Vital Sign    Worried About Running Out of Food in the Last Year: Never true    Ran Out of Food in the Last Year: Never true  Transportation Needs: No Transportation Needs (04/17/2023)   PRAPARE - Administrator, Civil Service (Medical): No    Lack of Transportation (Non-Medical): No  Physical Activity: Not on file  Stress: Stress Concern Present (08/12/2020)   Received from [facility], [facility] Occupational [facility] - Occupational Stress Questionnaire    Feeling of Stress : Very much  Social Connections: Unknown (09/01/2021)   Received from [facility], [facility]   Social Network    Social Network: Not on file   Additional Social History: lives along, currently staying with his parents     Sleep: Fair  Appetite:  Fair  Current Medications: Current Facility-Administered Medications  Medication Dose Route Frequency Provider Last Rate Last Admin   acetaminophen (TYLENOL) tablet 650 mg  650 mg Oral Q6H PRN Jearld Lesch, NP       alum & mag hydroxide-simeth (MAALOX/MYLANTA) 200-200-20 MG/5ML suspension 30 mL  30 mL Oral Q4H PRN Dixon, Rashaun M, NP       cholecalciferol (VITAMIN D3) 25 MCG (1000 UNIT) tablet 1,000 Units   1,000 Units Oral Daily Charm Rings, NP   1,000 Units at 04/23/23 3220   clonazePAM (KLONOPIN) tablet 0.5 mg  0.5 mg Oral BID Charm Rings, NP   0.5 mg at 04/23/23 2542   cloZAPine (CLOZARIL) tablet 300 mg  300 mg Oral QHS Lewanda Rife, MD   300 mg at 04/22/23 2058   cyanocobalamin (VITAMIN B12) injection 1,000 mcg  1,000 mcg Intramuscular Once Charm Rings, NP       haloperidol (HALDOL) tablet 5 mg  5 mg Oral TID PRN Jearld Lesch, NP   5 mg at 04/18/23 2243   And   diphenhydrAMINE (BENADRYL) capsule 50 mg  50 mg Oral TID PRN Jearld Lesch, NP   50 mg  at 04/22/23 2058   haloperidol lactate (HALDOL) injection 5 mg  5 mg Intramuscular TID PRN Jearld Lesch, NP       And   diphenhydrAMINE (BENADRYL) injection 50 mg  50 mg Intramuscular TID PRN Jearld Lesch, NP       And   LORazepam (ATIVAN) injection 2 mg  2 mg Intramuscular TID PRN Jearld Lesch, NP       haloperidol lactate (HALDOL) injection 10 mg  10 mg Intramuscular TID PRN Jearld Lesch, NP       And   diphenhydrAMINE (BENADRYL) injection 50 mg  50 mg Intramuscular TID PRN Jearld Lesch, NP       And   LORazepam (ATIVAN) injection 2 mg  2 mg Intramuscular TID PRN Dixon, Rashaun M, NP       fenofibrate tablet 54 mg  54 mg Oral Daily Lewanda Rife, MD   54 mg at 04/23/23 8469   hydrOXYzine (ATARAX) tablet 25 mg  25 mg Oral TID PRN Jearld Lesch, NP   25 mg at 04/22/23 2059   levETIRAcetam (KEPPRA) tablet 500 mg  500 mg Oral BID Lewanda Rife, MD   500 mg at 04/23/23 6295   levothyroxine (SYNTHROID) tablet 75 mcg  75 mcg Oral QAC breakfast Lewanda Rife, MD   75 mcg at 04/23/23 2841   lithium carbonate capsule 150 mg  150 mg Oral QHS Charm Rings, NP   150 mg at 04/22/23 2059   lubiprostone (AMITIZA) capsule 24 mcg  24 mcg Oral BID WC Charm Rings, NP   24 mcg at 04/23/23 0905   magnesium hydroxide (MILK OF MAGNESIA) suspension 30 mL  30 mL Oral Daily PRN Jearld Lesch, NP   30 mL at  04/20/23 0854   metFORMIN (GLUCOPHAGE-XR) 24 hr tablet 1,000 mg  1,000 mg Oral Q supper Charm Rings, NP   1,000 mg at 04/22/23 1655   pantoprazole (PROTONIX) EC tablet 80 mg  80 mg Oral Q1200 Charm Rings, NP   80 mg at 04/22/23 1257   polyethylene glycol (MIRALAX / GLYCOLAX) packet 17 g  17 g Oral Daily Charm Rings, NP   17 g at 04/23/23 3244   risperiDONE (RISPERDAL) tablet 2 mg  2 mg Oral QHS Charm Rings, NP   2 mg at 04/22/23 2059   tamsulosin (FLOMAX) capsule 0.4 mg  0.4 mg Oral Daily Lewanda Rife, MD   0.4 mg at 04/23/23 0905   traZODone (DESYREL) tablet 50 mg  50 mg Oral QHS PRN Jearld Lesch, NP   50 mg at 04/21/23 2225    Lab Results:  No results found for this or any previous visit (from the past 48 hours).   Blood Alcohol level:  Lab Results  Component Value Date   ETH <10 04/14/2023   ETH <10 08/01/2022    Metabolic Disorder Labs: Lab Results  Component Value Date   HGBA1C 5.8 (H) 04/19/2023   MPG 119.76 04/19/2023   MPG 108.28 08/12/2022   No results found for: "PROLACTIN" Lab Results  Component Value Date   CHOL 210 (H) 04/19/2023   TRIG 143 04/19/2023   HDL 81 04/19/2023   CHOLHDL 2.6 04/19/2023   VLDL 29 04/19/2023   LDLCALC 100 (H) 04/19/2023   LDLCALC 66 08/12/2022     Musculoskeletal: Strength & Muscle Tone: within normal limits Gait & Station: normal Patient leans: N/A  Psychiatric Specialty Exam: Physical Exam Vitals and  nursing note reviewed.  Constitutional:      Appearance: Normal appearance.  HENT:     Head: Normocephalic.     Nose: Nose normal.  Pulmonary:     Effort: Pulmonary effort is normal.  Musculoskeletal:        General: Normal range of motion.     Cervical back: Normal range of motion.  Neurological:     General: No focal deficit present.     Mental Status: He is alert and oriented to person, place, and time.     Review of Systems  Psychiatric/Behavioral:  Positive for depression and suicidal  ideas. The patient is nervous/anxious.   All other systems reviewed and are negative.   Blood pressure 136/85, pulse 93, temperature 98 F (36.7 C), resp. rate 18, height 5\' 9"  (1.753 m), weight 69 kg, SpO2 99%.Body mass index is 22.46 kg/m.  General Appearance: Casual  Eye Contact:  Good  Speech:  Normal Rate  Volume:  Normal  Mood:  Anxious and Depressed  Affect:  Congruent  Thought Process:  Coherent  Orientation:  Full (Time, Place, and Person)  Thought Content:  Rumination  Suicidal Thoughts:  Yes.  without intent/plan  Homicidal Thoughts:  No  Memory:  Immediate;   Good Recent;   Good Remote;   Good  Judgement:  Fair  Insight:  Fair  Psychomotor Activity:  Normal  Concentration:  Concentration: Good and Attention Span: Good  Recall:  Good  Fund of Knowledge:  Good  Language:  Good  Akathisia:  No  Handed:  Right  AIMS (if indicated):     Assets:  Housing Leisure Time Resilience Social Support  ADL's:  Intact  Cognition:  WNL  Sleep:         Physical Exam: Physical Exam Vitals and nursing note reviewed.  Constitutional:      Appearance: Normal appearance.  HENT:     Head: Normocephalic.     Nose: Nose normal.  Pulmonary:     Effort: Pulmonary effort is normal.  Musculoskeletal:        General: Normal range of motion.     Cervical back: Normal range of motion.  Neurological:     General: No focal deficit present.     Mental Status: He is alert and oriented to person, place, and time.    Review of Systems  Psychiatric/Behavioral:  Positive for depression and suicidal ideas. The patient is nervous/anxious.   All other systems reviewed and are negative.  Blood pressure 136/85, pulse 93, temperature 98 F (36.7 C), resp. rate 18, height 5\' 9"  (1.753 m), weight 69 kg, SpO2 99%. Body mass index is 22.46 kg/m.   Treatment Plan Summary: Daily contact with patient to assess and evaluate symptoms and progress in treatment, Medication management, and Plan  : Bipolar affective disorder, depressed, severe without psychosis: Clozaril 300 mg daily, Clozaril blood testing Luvox 50 mg daily discontinued as he was started and two days later had a seizure which can occur with Luvox, typically in therapeutic doses.  However, his mood stabilization medications are being reduced per his psychiatrist, Dr. Jennelle Human, and the Luvox could potentially trigger a manic episode. Risperdal 2 mg BID decreased to 2 mg at bedtime as Dr. Jennelle Human his regular psychiatrist is tapering him off of this medication  His mother is in agreement along with the patient.  Anxiety: Hydroxyzine 25 mg TID PRN Discontinued gabapentin 100 mg BID Klonopin 0.5 mg BID started  Nanine Means, NP 04/23/2023, 11:20 AM

## 2023-04-23 NOTE — Plan of Care (Signed)
Pt. Was compliant with meds today. Came out when needed .

## 2023-04-23 NOTE — Group Note (Signed)
Date:  04/23/2023 Time:  9:22 PM  Group Topic/Focus:  Emotional Education:   The focus of this group is to discuss what feelings/emotions are, and how they are experienced.    Participation Level:  Active  Participation Quality:  Appropriate  Affect:  Appropriate  Cognitive:  Appropriate  Insight: Appropriate and Good  Engagement in Group:  Engaged  Modes of Intervention:  Discussion   Lenore Cordia 04/23/2023, 9:22 PM

## 2023-04-23 NOTE — Group Note (Unsigned)
Date:  04/23/2023 Time:  11:10 AM  Group Topic/Focus:  Self Care:   The focus of this group is to help patients understand the importance of self-care in order to improve or restore emotional, physical, spiritual, interpersonal, and financial health.     Participation Level:  {BHH PARTICIPATION NWGNF:62130}  Participation Quality:  {BHH PARTICIPATION QUALITY:22265}  Affect:  {BHH AFFECT:22266}  Cognitive:  {BHH COGNITIVE:22267}  Insight: {BHH Insight2:20797}  Engagement in Group:  {BHH ENGAGEMENT IN GROUP:22268}  Modes of Intervention:  {BHH MODES OF INTERVENTION:22269}  Additional Comments:  ***  Keyondre Hepburn 04/23/2023, 11:10 AM

## 2023-04-24 DIAGNOSIS — F314 Bipolar disorder, current episode depressed, severe, without psychotic features: Secondary | ICD-10-CM | POA: Diagnosis not present

## 2023-04-24 LAB — CLOZAPINE (CLOZARIL)
Clozapine Lvl: 1538 ng/mL — ABNORMAL HIGH (ref 350–600)
NorClozapine: 549 ng/mL
Total(Cloz+Norcloz): 2087 ng/mL

## 2023-04-24 MED ORDER — TRAZODONE HCL 50 MG PO TABS: 50.0000 mg | ORAL_TABLET | Freq: Every evening | ORAL | 0 refills | Status: DC | PRN

## 2023-04-24 MED ORDER — RISPERIDONE 2 MG PO TABS: 2.0000 mg | ORAL_TABLET | Freq: Every day | ORAL | 0 refills | Status: DC

## 2023-04-24 MED ORDER — CLONAZEPAM 0.5 MG PO TABS: 0.5000 mg | ORAL_TABLET | Freq: Two times a day (BID) | ORAL | 0 refills | Status: DC

## 2023-04-24 NOTE — Progress Notes (Signed)
  [facility] Adult Case Management Discharge Plan :  Will you be returning to the same living situation after discharge:  Yes,  [address] [place] At discharge, do you have transportation home?: Yes,  Parents are picking up the patient. Do you have the ability to pay for your medications: Yes,  [employer]  Release of information consent forms completed and in the chart;  Patient's signature needed at discharge.  Patient to Follow up at:  Follow-up Information     [facility] Follow up.   Why: Monday-Friday 8:00 am- 5:00 pm Contact information: [address] [place] [address] [phone]                 Next level of care provider has access to [facility] Link:yes  Safety Planning and Suicide Prevention discussed: Yes,  SPE completed with pt, as pt refused to consent to family contact.     Has patient been referred to the Quitline?: Patient does not use tobacco/nicotine products  Patient has been referred for addiction treatment: Yes, referral information given but appointment not made [facility] (list facility).  Marshell Levan, LCSW 04/24/2023, 1:13 PM

## 2023-04-24 NOTE — Progress Notes (Signed)
   04/23/23 2100  Psych Admission Type (Psych Patients Only)  Admission Status Voluntary  Psychosocial Assessment  Patient Complaints Anxiety  Eye Contact Fair  Facial Expression Anxious;Sad  Affect Anxious  Speech Logical/coherent  Interaction Assertive  Motor Activity Slow  Appearance/Hygiene In scrubs  Behavior Characteristics Cooperative;Appropriate to situation  Mood Depressed  Aggressive Behavior  Effect No apparent injury  Thought Process  Coherency WDL  Content WDL  Delusions None reported or observed  Perception WDL  Hallucination None reported or observed  Judgment Impaired  Confusion WDL  Danger to Self  Current suicidal ideation? Denies   Patient alert and oriented x 4, he appears anxious was medicated as needed, with good effect. Patient interacting appropriately with peers and staff, denies SI/HI/AVH. 15 minutes safety checks maintained.

## 2023-04-24 NOTE — Progress Notes (Signed)
Patient given belongings, and discharge paperwork and prescriptions and escorted to the front of the hospital for his ride home.

## 2023-04-24 NOTE — BHH Counselor (Signed)
The LCSWA provided the patient with a list of resources for therapy services in [place] that specialize in CBT and OCD.    Patric Dykes, MSW, [employer]

## 2023-04-24 NOTE — Progress Notes (Incomplete)
[facility] MD Progress Note  04/24/2023 12:42 PM Albert [name]  MRN:  [id number]  Subjective:  Notes, labs, and vital signs reviewed; progression meeting completed.  Upon assessment, Albert [name] r  Collateral from his mother, Albert [name]: She feels he is better and his brother indicated he felt he was ready to return home. On assessment, Albert [name] did feel his symptoms would improve from being at home in his own environment.    Clozaril labs still not back, [facility] lab called and they provided [facility] number of [phone] who contacted  with hospital services.  She stated she would try and get this competed.  Labs need to be obtained prior to giving a Clozaril Rx which would prevent discharge.  Explained this to the service so they were aware of the urgency.  Principal Problem: Bipolar disorder, current episode depressed, severe, without psychotic features (HCC) Diagnosis: Principal Problem:   Bipolar disorder, current episode depressed, severe, without psychotic features (HCC) Active Problems:   Obsessive-compulsive disorder  Total Time spent with patient: 30 minutes  Past Psychiatric History: bipolar d/o, depression, anxiety, ADHD  Past Medical History:  Past Medical History:  Diagnosis Date   ADHD (attention deficit hyperactivity disorder)    Anemia    Anxiety    Bipolar disorder (HCC)    BPH (benign prostatic hyperplasia)    Chronic kidney disease, stage 3b (HCC)    Constipation    DDD (degenerative disc disease), cervical    Depression    Deviated septum    ED (erectile dysfunction)    GERD (gastroesophageal reflux disease)    Graves disease    History of hiatal hernia    Hypertension    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    Hypothyroidism    Pneumonia    PONV (postoperative nausea and vomiting)     Past Surgical History:  Procedure Laterality Date   BACK SURGERY     lumbar disc   COLONOSCOPY N/A 12/23/2022   Procedure: COLONOSCOPY;  Surgeon: Jaynie Collins, DO;  Location: [facility] ENDOSCOPY;  Service: Gastroenterology;  Laterality: N/A;   colonoscopyx2 N/A    2003, 2016   ESOPHAGOGASTRODUODENOSCOPY     1998, 2002, 2003, 2016   ESOPHAGOGASTRODUODENOSCOPY (EGD) WITH PROPOFOL N/A 09/12/2020   Procedure: ESOPHAGOGASTRODUODENOSCOPY (EGD) WITH PROPOFOL;  Surgeon: Earline Mayotte, MD;  Location: [facility] ENDOSCOPY;  Service: Endoscopy;  Laterality: N/A;  1ST CASE   EYE SURGERY  2006   strabismus surgery   HERNIA REPAIR     Hiatal Hernia   INSERTION OF MESH Bilateral 03/03/2021   Procedure: INSERTION OF MESH;  Surgeon: Leafy Ro, MD;  Location: [facility] ORS;  Service: General;  Laterality: Bilateral;   NASAL SEPTUM SURGERY     NM I- 131 THERAPY FOR ABLATION ([facility] HX)  04/11/2015   thyroid   SPINE SURGERY     XI ROBOTIC ASSISTED PARAESOPHAGEAL HERNIA REPAIR N/A 10/23/2020   Procedure: XI ROBOTIC ASSISTED PARAESOPHAGEAL HERNIA REPAIR WITH MESH AND INCISIONAL HERNIA REPAIR WITH MESH;  Surgeon: Leafy Ro, MD;  Location: [facility] ORS;  Service: General;  Laterality: N/A;   Family History:  Family History  Problem Relation Age of Onset   Prostate cancer Neg Hx    Bladder Cancer Neg Hx    Kidney cancer Neg Hx    Family Psychiatric  History: none Social History:  Social History   Substance and Sexual Activity  Alcohol Use Not Currently     Social History  Substance and Sexual Activity  Drug Use Never    Social History   Socioeconomic History   Marital status: Single    Spouse name: Not on file   Number of children: Not on file   Years of education: Not on file   Highest education level: Not on file  Occupational History   Not on file  Tobacco Use   Smoking status: Former    Current packs/day: 0.00    Types: Cigarettes    Quit date: 2010    Years since quitting: 14.9    Passive exposure: Past   Smokeless tobacco: Former    Types: Associate Professor status: Never Used  Substance and Sexual Activity   Alcohol use:  Not Currently   Drug use: Never   Sexual activity: Yes  Other Topics Concern   Not on file  Social History Narrative   Not on file   Social Drivers of Health   Financial Resource Strain: Low Risk  (11/03/2022)   Received from [facility], [facility]   Overall Financial Resource Strain (CARDIA)    Difficulty of Paying Living Expenses: Not very hard  Food Insecurity: No Food Insecurity (04/17/2023)   Hunger Vital Sign    Worried About Running Out of Food in the Last Year: Never true    Ran Out of Food in the Last Year: Never true  Transportation Needs: No Transportation Needs (04/17/2023)   PRAPARE - Administrator, Civil Service (Medical): No    Lack of Transportation (Non-Medical): No  Physical Activity: Not on file  Stress: Stress Concern Present (08/12/2020)   Received from [facility], [facility] - Occupational Stress Questionnaire    Feeling of Stress : Very much  Social Connections: Unknown (09/01/2021)   Received from [facility], [facility]   Social Network    Social Network: Not on file   Additional Social History: lives along, currently staying with his parents     Sleep: Fair  Appetite:  Fair  Current Medications: Current Facility-Administered Medications  Medication Dose Route Frequency Provider Last Rate Last Admin   acetaminophen (TYLENOL) tablet 650 mg  650 mg Oral Q6H PRN Jearld Lesch, NP       alum & mag hydroxide-simeth (MAALOX/MYLANTA) 200-200-20 MG/5ML suspension 30 mL  30 mL Oral Q4H PRN Dixon, Rashaun M, NP       cholecalciferol (VITAMIN D3) 25 MCG (1000 UNIT) tablet 1,000 Units  1,000 Units Oral Daily Charm Rings, NP   1,000 Units at 04/24/23 0840   clonazePAM (KLONOPIN) tablet 0.5 mg  0.5 mg Oral BID Charm Rings, NP   0.5 mg at 04/24/23 0841   cloZAPine (CLOZARIL) tablet 300 mg  300 mg Oral QHS Lewanda Rife, MD   300 mg at 04/23/23 2110    haloperidol (HALDOL) tablet 5 mg  5 mg Oral TID PRN Jearld Lesch, NP   5 mg at 04/18/23 2243   And   diphenhydrAMINE (BENADRYL) capsule 50 mg  50 mg Oral TID PRN Jearld Lesch, NP   50 mg at 04/22/23 2058   haloperidol lactate (HALDOL) injection 5 mg  5 mg Intramuscular TID PRN Jearld Lesch, NP       And   diphenhydrAMINE (BENADRYL) injection 50 mg  50 mg Intramuscular TID PRN Jearld Lesch, NP       And   LORazepam (ATIVAN)  injection 2 mg  2 mg Intramuscular TID PRN Jearld Lesch, NP       haloperidol lactate (HALDOL) injection 10 mg  10 mg Intramuscular TID PRN Jearld Lesch, NP       And   diphenhydrAMINE (BENADRYL) injection 50 mg  50 mg Intramuscular TID PRN Jearld Lesch, NP       And   LORazepam (ATIVAN) injection 2 mg  2 mg Intramuscular TID PRN Dixon, Rashaun M, NP       fenofibrate tablet 54 mg  54 mg Oral Daily Lewanda Rife, MD   54 mg at 04/24/23 0841   hydrOXYzine (ATARAX) tablet 25 mg  25 mg Oral TID PRN Jearld Lesch, NP   25 mg at 04/22/23 2059   levETIRAcetam (KEPPRA) tablet 500 mg  500 mg Oral BID Lewanda Rife, MD   500 mg at 04/24/23 0841   levothyroxine (SYNTHROID) tablet 75 mcg  75 mcg Oral QAC breakfast Lewanda Rife, MD   75 mcg at 04/24/23 4403   lithium carbonate capsule 150 mg  150 mg Oral QHS Charm Rings, NP   150 mg at 04/23/23 2109   lubiprostone (AMITIZA) capsule 24 mcg  24 mcg Oral BID WC Charm Rings, NP   24 mcg at 04/24/23 4742   magnesium hydroxide (MILK OF MAGNESIA) suspension 30 mL  30 mL Oral Daily PRN Jearld Lesch, NP   30 mL at 04/20/23 0854   metFORMIN (GLUCOPHAGE-XR) 24 hr tablet 1,000 mg  1,000 mg Oral Q supper Charm Rings, NP   1,000 mg at 04/23/23 1720   pantoprazole (PROTONIX) EC tablet 80 mg  80 mg Oral Q1200 Charm Rings, NP   80 mg at 04/24/23 1213   polyethylene glycol (MIRALAX / GLYCOLAX) packet 17 g  17 g Oral Daily Charm Rings, NP   17 g at 04/24/23 0841   risperiDONE  (RISPERDAL) tablet 2 mg  2 mg Oral QHS Charm Rings, NP   2 mg at 04/23/23 2109   tamsulosin (FLOMAX) capsule 0.4 mg  0.4 mg Oral Daily Lewanda Rife, MD   0.4 mg at 04/24/23 0841   traZODone (DESYREL) tablet 50 mg  50 mg Oral QHS PRN Jearld Lesch, NP   50 mg at 04/23/23 2141    Lab Results:  No results found for this or any previous visit (from the past 48 hours).   Blood Alcohol level:  Lab Results  Component Value Date   ETH <10 04/14/2023   ETH <10 08/01/2022    Metabolic Disorder Labs: Lab Results  Component Value Date   HGBA1C 5.8 (H) 04/19/2023   MPG 119.76 04/19/2023   MPG 108.28 08/12/2022   No results found for: "PROLACTIN" Lab Results  Component Value Date   CHOL 210 (H) 04/19/2023   TRIG 143 04/19/2023   HDL 81 04/19/2023   CHOLHDL 2.6 04/19/2023   VLDL 29 04/19/2023   LDLCALC 100 (H) 04/19/2023   LDLCALC 66 08/12/2022     Musculoskeletal: Strength & Muscle Tone: within normal limits Gait & Station: normal Patient leans: N/A  Psychiatric Specialty Exam: Physical Exam Vitals and nursing note reviewed.  Constitutional:      Appearance: Normal appearance.  HENT:     Head: Normocephalic.     Nose: Nose normal.  Pulmonary:     Effort: Pulmonary effort is normal.  Musculoskeletal:        General: Normal range of motion.  Cervical back: Normal range of motion.  Neurological:     General: No focal deficit present.     Mental Status: He is alert and oriented to person, place, and time.     Review of Systems  Psychiatric/Behavioral:  Positive for depression. The patient is nervous/anxious.   All other systems reviewed and are negative.   Blood pressure 99/67, pulse 84, temperature 98.4 F (36.9 C), resp. rate 16, height 5\' 9"  (1.753 m), weight 69 kg, SpO2 98%.Body mass index is 22.46 kg/m.  General Appearance: Casual  Eye Contact:  Good  Speech:  Normal Rate  Volume:  Normal  Mood:  Anxious and Depressed  Affect:  Congruent   Thought Process:  Coherent  Orientation:  Full (Time, Place, and Person)  Thought Content:  Rumination  Suicidal Thoughts:  Yes.  without intent/plan  Homicidal Thoughts:  No  Memory:  Immediate;   Good Recent;   Good Remote;   Good  Judgement:  Fair  Insight:  Fair  Psychomotor Activity:  Normal  Concentration:  Concentration: Good and Attention Span: Good  Recall:  Good  Fund of Knowledge:  Good  Language:  Good  Akathisia:  No  Handed:  Right  AIMS (if indicated):     Assets:  Housing Leisure Time Resilience Social Support  ADL's:  Intact  Cognition:  WNL  Sleep:         Physical Exam: Physical Exam Vitals and nursing note reviewed.  Constitutional:      Appearance: Normal appearance.  HENT:     Head: Normocephalic.     Nose: Nose normal.  Pulmonary:     Effort: Pulmonary effort is normal.  Musculoskeletal:        General: Normal range of motion.     Cervical back: Normal range of motion.  Neurological:     General: No focal deficit present.     Mental Status: He is alert and oriented to person, place, and time.    Review of Systems  Psychiatric/Behavioral:  Positive for depression. The patient is nervous/anxious.   All other systems reviewed and are negative.  Blood pressure 99/67, pulse 84, temperature 98.4 F (36.9 C), resp. rate 16, height 5\' 9"  (1.753 m), weight 69 kg, SpO2 98%. Body mass index is 22.46 kg/m.   Treatment Plan Summary: Daily contact with patient to assess and evaluate symptoms and progress in treatment, Medication management, and Plan : Bipolar affective disorder, depressed, severe without psychosis: Clozaril 300 mg daily, Clozaril blood testing, awaiting results Risperdal 2 mg at bedtime for two more doses then discontinue per his provider, Dr Jennelle Human.  Anxiety: Hydroxyzine 25 mg TID PRN Klonopin 0.5 mg BID   Nanine Means, NP 04/24/2023, 12:42 PM

## 2023-04-24 NOTE — [facility] Suicide Risk Assessment (Signed)
[facility] Discharge Suicide Risk Assessment   Principal Problem: Bipolar disorder, current episode depressed, severe, without psychotic features [facility]) Discharge Diagnoses: Principal Problem:   Bipolar disorder, current episode depressed, severe, without psychotic features (HCC) Active Problems:   Obsessive-compulsive disorder   Total Time spent with patient: 45 minutes  Musculoskeletal: Strength & Muscle Tone: within normal limits Gait & Station: normal Patient leans: N/A  Psychiatric Specialty Exam: Physical Exam Vitals and nursing note reviewed.  Constitutional:      Appearance: Normal appearance.  HENT:     Head: Normocephalic.     Nose: Nose normal.  Pulmonary:     Effort: Pulmonary effort is normal.  Musculoskeletal:        General: Normal range of motion.     Cervical back: Normal range of motion.  Neurological:     General: No focal deficit present.     Mental Status: He is alert and oriented to person, place, and time.     Review of Systems  Psychiatric/Behavioral:  Positive for depression. The patient is nervous/anxious.   All other systems reviewed and are negative.   Blood pressure 99/67, pulse 84, temperature 98.4 F (36.9 C), resp. rate 16, height 5\' 9"  (1.753 m), weight 69 kg, SpO2 98%.Body mass index is 22.46 kg/m.  General Appearance: Casual  Eye Contact:  Good  Speech:  Clear and Coherent  Volume:  Normal  Mood:  Anxious and Depressed  Affect:  Congruent  Thought Process:  Coherent  Orientation:  Full (Time, Place, and Person)  Thought Content:  WDL and Logical  Suicidal Thoughts:  No  Homicidal Thoughts:  No  Memory:  Immediate;   Good Recent;   Good Remote;   Good  Judgement:  Good  Insight:  Good  Psychomotor Activity:  Normal  Concentration:  Concentration: Good and Attention Span: Good  Recall:  Good  Fund of Knowledge:  Good  Language:  Good  Akathisia:  No  Handed:  Right  AIMS (if indicated):     Assets:  Communication  Skills Desire for Improvement Financial Resources/Insurance Housing Leisure Time Physical Health Resilience Social Support Transportation  ADL's:  Intact  Cognition:  WNL  Sleep:       Physical Exam: Physical Exam Vitals and nursing note reviewed.  Constitutional:      Appearance: Normal appearance.  HENT:     Head: Normocephalic.     Nose: Nose normal.  Pulmonary:     Effort: Pulmonary effort is normal.  Musculoskeletal:        General: Normal range of motion.     Cervical back: Normal range of motion.  Neurological:     General: No focal deficit present.     Mental Status: He is alert and oriented to person, place, and time.    Review of Systems  Psychiatric/Behavioral:  Positive for depression. The patient is nervous/anxious.   All other systems reviewed and are negative.  Blood pressure 99/67, pulse 84, temperature 98.4 F (36.9 C), resp. rate 16, height 5\' 9"  (1.753 m), weight 69 kg, SpO2 98%. Body mass index is 22.46 kg/m.  Mental Status Per Nursing Assessment::   On Admission:  Suicidal ideation indicated by others  Demographic Factors:  Male and Caucasian  Loss Factors: NA  Historical Factors: NA  Risk Reduction Factors:   Sense of responsibility to family, Living with another person, especially a relative, Positive social support, Positive therapeutic relationship, and Positive coping skills or problem solving skills  Continued Clinical Symptoms:  Depression, moderate; anxiety, low  Cognitive Features That Contribute To Risk:  None    Suicide Risk:  Minimal: No identifiable suicidal ideation.  Patients presenting with no risk factors but with morbid ruminations; may be classified as minimal risk based on the severity of the depressive symptoms   Follow-up Information     [facility] Follow up.   Why: Monday-Friday 8:00 am- 5:00 pm Contact information: [address] [place] [address] [phone]                  Plan Of Care/Follow-up recommendations:  Activity:  as tolerted Diet:  heart healthy diet Bipolar affective disorder, depressed, severe without psychosis: Clozaril 300 mg daily, Clozaril blood testing, awaiting results Risperdal 2 mg at bedtime for two more doses then discontinue per his provider, Dr Jennelle Human.   Anxiety: Klonopin 0.5 mg BID   Insomnia: Trazodone 50 mg daily at bedtime PRN  Nanine Means, NP 04/24/2023, 1:22 PM

## 2023-04-24 NOTE — Plan of Care (Signed)
  Problem: Education: Goal: Emotional status will improve Outcome: Progressing   Problem: Education: Goal: Knowledge of [facility] Education information/materials will improve Outcome: Progressing   Problem: Education: Goal: Verbalization of understanding the information provided will improve Outcome: Progressing   Problem: Education: Goal: Mental status will improve Outcome: Progressing

## 2023-04-24 NOTE — Discharge Summary (Signed)
Physician Discharge Summary Note  Patient:  Albert [name] is an 57 y.o., male MRN:  [id number] DOB:  1965-05-27 Patient phone:  [phone] (home)  Patient address:   [address] [place] [address],  Total Time spent with patient: 45 minutes  Date of Admission:  04/17/2023 Date of Discharge: 04/24/2023  Reason for Admission:  intrusive suicidal ideations  Principal Problem: Bipolar disorder, current episode depressed, severe, without psychotic features [facility]) Discharge Diagnoses: Principal Problem:   Bipolar disorder, current episode depressed, severe, without psychotic features (HCC) Active Problems:   Obsessive-compulsive disorder   Past Psychiatric History: anxiety, bipolar d/o  Past Medical History:  Past Medical History:  Diagnosis Date   ADHD (attention deficit hyperactivity disorder)    Anemia    Anxiety    Bipolar disorder (HCC)    BPH (benign prostatic hyperplasia)    Chronic kidney disease, stage 3b (HCC)    Constipation    DDD (degenerative disc disease), cervical    Depression    Deviated septum    ED (erectile dysfunction)    GERD (gastroesophageal reflux disease)    Graves disease    History of hiatal hernia    Hypertension    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    Hypothyroidism    Pneumonia    PONV (postoperative nausea and vomiting)     Past Surgical History:  Procedure Laterality Date   BACK SURGERY     lumbar disc   COLONOSCOPY N/A 12/23/2022   Procedure: COLONOSCOPY;  Surgeon: Jaynie Collins, DO;  Location: [facility] ENDOSCOPY;  Service: Gastroenterology;  Laterality: N/A;   colonoscopyx2 N/A    2003, 2016   ESOPHAGOGASTRODUODENOSCOPY     1998, 2002, 2003, 2016   ESOPHAGOGASTRODUODENOSCOPY (EGD) WITH PROPOFOL N/A 09/12/2020   Procedure: ESOPHAGOGASTRODUODENOSCOPY (EGD) WITH PROPOFOL;  Surgeon: Earline Mayotte, MD;  Location: [facility] ENDOSCOPY;  Service: Endoscopy;  Laterality: N/A;  1ST CASE   EYE  SURGERY  2006   strabismus surgery   HERNIA REPAIR     Hiatal Hernia   INSERTION OF MESH Bilateral 03/03/2021   Procedure: INSERTION OF MESH;  Surgeon: Leafy Ro, MD;  Location: [facility] ORS;  Service: General;  Laterality: Bilateral;   NASAL SEPTUM SURGERY     NM I- 131 THERAPY FOR ABLATION ([facility] HX)  04/11/2015   thyroid   SPINE SURGERY     XI ROBOTIC ASSISTED PARAESOPHAGEAL HERNIA REPAIR N/A 10/23/2020   Procedure: XI ROBOTIC ASSISTED PARAESOPHAGEAL HERNIA REPAIR WITH MESH AND INCISIONAL HERNIA REPAIR WITH MESH;  Surgeon: Leafy Ro, MD;  Location: [facility] ORS;  Service: General;  Laterality: N/A;   Family History:  Family History  Problem Relation Age of Onset   Prostate cancer Neg Hx    Bladder Cancer Neg Hx    Kidney cancer Neg Hx    Family Psychiatric  History: see above Social History:  Social History   Substance and Sexual Activity  Alcohol Use Not Currently     Social History   Substance and Sexual Activity  Drug Use Never    Social History   Socioeconomic History   Marital status: Single    Spouse name: Not on file   Number of children: Not on file   Years of education: Not on file   Highest education level: Not on file  Occupational History   Not on file  Tobacco Use   Smoking status: Former    Current packs/day: 0.00  Types: Cigarettes    Quit date: 2010    Years since quitting: 14.9    Passive exposure: Past   Smokeless tobacco: Former    Types: Engineer, drilling   Vaping status: Never Used  Substance and Sexual Activity   Alcohol use: Not Currently   Drug use: Never   Sexual activity: Yes  Other Topics Concern   Not on file  Social History Narrative   Not on file   Social Drivers of Health   Financial Resource Strain: Low Risk  (11/03/2022)   Received from [facility], [facility]   Overall Financial Resource Strain (CARDIA)    Difficulty of Paying Living Expenses: Not very hard  Food Insecurity: No  Food Insecurity (04/17/2023)   Hunger Vital Sign    Worried About Running Out of Food in the Last Year: Never true    Ran Out of Food in the Last Year: Never true  Transportation Needs: No Transportation Needs (04/17/2023)   PRAPARE - Administrator, Civil Service (Medical): No    Lack of Transportation (Non-Medical): No  Physical Activity: Not on file  Stress: Stress Concern Present (08/12/2020)   Received from [facility], [facility] - Occupational Stress Questionnaire    Feeling of Stress : Very much  Social Connections: Unknown (09/01/2021)   Received from [facility], [facility]   Social Network    Social Network: Not on file    Hospital Course:   57 yo male admitted for intrusive suicidal ideations after experiencing two new onset seizures.  He was stabilized medically and then transferred to the BMU. Medications restarted and adjusted along with the initiation of therapy. The client stabilized with occasional passive suicidal ideation, "They are just intrusive thoughts", moderate depression and low anxiety. No side effects from his medications. Denies homicidal ideations,hallucinations, and substance abuse. Albert [name] has met maximum capacity of hospitalization. Discharge instructions provided along with explanations and crisis numbers, Rx, and follow up appointment information.   Caveat:  His Clozaril level is still not back, [facility] notified again with it is "still within the 2-4 day window".  Albert [name] does have 3 days of clozaril at home and his mother will contact his provider, Dr Jennelle Human tomorrow.  His labs should be completed by tonight at midnight.  Albert [name] and his mother would like him to discharge today and continue his care outpatient as they both feel he is doing better.  Musculoskeletal: Strength & Muscle Tone: within normal limits Gait & Station: normal Patient leans: N/A   Psychiatric Specialty Exam: Physical Exam Vitals  and nursing note reviewed.  Constitutional:      Appearance: Normal appearance.  HENT:     Head: Normocephalic.     Nose: Nose normal.  Pulmonary:     Effort: Pulmonary effort is normal.  Musculoskeletal:        General: Normal range of motion.     Cervical back: Normal range of motion.  Neurological:     General: No focal deficit present.     Mental Status: He is alert and oriented to person, place, and time.       Review of Systems  Psychiatric/Behavioral:  Positive for depression. The patient is nervous/anxious.   All other systems reviewed and are negative.    Blood pressure 99/67, pulse 84, temperature 98.4 F (36.9 C), resp. rate 16, height 5\' 9"  (1.753 m), weight 69 kg, SpO2 98%.Body  mass index is 22.46 kg/m.  General Appearance: Casual  Eye Contact:  Good  Speech:  Clear and Coherent  Volume:  Normal  Mood:  Anxious and Depressed  Affect:  Congruent  Thought Process:  Coherent  Orientation:  Full (Time, Place, and Person)  Thought Content:  WDL and Logical  Suicidal Thoughts:  No  Homicidal Thoughts:  No  Memory:  Immediate;   Good Recent;   Good Remote;   Good  Judgement:  Good  Insight:  Good  Psychomotor Activity:  Normal  Concentration:  Concentration: Good and Attention Span: Good  Recall:  Good  Fund of Knowledge:  Good  Language:  Good  Akathisia:  No  Handed:  Right  AIMS (if indicated):     Assets:  Communication Skills Desire for Improvement Financial Resources/Insurance Housing Leisure Time Physical Health Resilience Social Support Transportation  ADL's:  Intact  Cognition:  WNL  Sleep:        Physical Exam: Physical Exam Vitals and nursing note reviewed.  Constitutional:      Appearance: Normal appearance.  HENT:     Head: Normocephalic.     Nose: Nose normal.  Pulmonary:     Effort: Pulmonary effort is normal.  Musculoskeletal:        General: Normal range of motion.     Cervical back: Normal range of motion.   Neurological:     General: No focal deficit present.     Mental Status: He is alert and oriented to person, place, and time.    Review of Systems  Psychiatric/Behavioral:  Positive for depression. The patient is nervous/anxious.   All other systems reviewed and are negative.  Blood pressure 99/67, pulse 84, temperature 98.4 F (36.9 C), resp. rate 16, height 5\' 9"  (1.753 m), weight 69 kg, SpO2 98%. Body mass index is 22.46 kg/m.   Social History   Tobacco Use  Smoking Status Former   Current packs/day: 0.00   Types: Cigarettes   Quit date: 2010   Years since quitting: 14.9   Passive exposure: Past  Smokeless Tobacco Former   Types: Chew   Tobacco Cessation:  N/A, patient does not currently use tobacco products   Blood Alcohol level:  Lab Results  Component Value Date   ETH <10 04/14/2023   ETH <10 08/01/2022    Metabolic Disorder Labs:  Lab Results  Component Value Date   HGBA1C 5.8 (H) 04/19/2023   MPG 119.76 04/19/2023   MPG 108.28 08/12/2022   No results found for: "PROLACTIN" Lab Results  Component Value Date   CHOL 210 (H) 04/19/2023   TRIG 143 04/19/2023   HDL 81 04/19/2023   CHOLHDL 2.6 04/19/2023   VLDL 29 04/19/2023   LDLCALC 100 (H) 04/19/2023   LDLCALC 66 08/12/2022    See Psychiatric Specialty Exam and Suicide Risk Assessment completed by Attending Physician prior to discharge.  Discharge destination:  Home  Is patient on multiple antipsychotic therapies at discharge:  No   Has Patient had three or more failed trials of antipsychotic monotherapy by history:  No  Recommended Plan for Multiple Antipsychotic Therapies: NA  Discharge Instructions     Diet - low sodium heart healthy   Complete by: As directed    Discharge instructions   Complete by: As directed    Follow up with Dr Jennelle Human and therapy   Increase activity slowly   Complete by: As directed       Allergies as of 04/24/2023  Reactions   Meloxicam Other (See  Comments)   Dizziness   Zyprexa [olanzapine] Other (See Comments)   Akathesia   Nsaids Other (See Comments)   Hallucinations   Ibuprofen Other (See Comments)   Can not take because taking lithium   Prednisone Other (See Comments)   Can't sleep    Wellbutrin [bupropion] Other (See Comments)   Suicidal thoughts        Medication List     STOP taking these medications    fluvoxaMINE 100 MG tablet Commonly known as: LUVOX   LORazepam 0.5 MG tablet Commonly known as: ATIVAN       TAKE these medications      Indication  acetaminophen 500 MG tablet Commonly known as: TYLENOL Take 1,000 mg by mouth every 6 (six) hours as needed for moderate pain.  Indication: Fever, Pain   atropine 1 % ophthalmic solution Place 2 drops under the tongue 3 (three) times daily as needed.  Indication: Prevention of Secretions of the Respiratory Tract   cholecalciferol 25 MCG (1000 UNIT) tablet Commonly known as: VITAMIN D3 Take 1,000 Units by mouth daily.  Indication: Vitamin D Deficiency   clonazePAM 0.5 MG tablet Commonly known as: KLONOPIN Take 1 tablet (0.5 mg total) by mouth 2 (two) times daily.  Indication: Feeling Anxious   cloZAPine 100 MG tablet Commonly known as: CLOZARIL Take 3 tablets (300 mg total) by mouth at bedtime.  Indication: Manic-Depression   esomeprazole 40 MG capsule Commonly known as: NEXIUM Take 40 mg by mouth daily.  Indication: Gastroesophageal Reflux Disease   fenofibrate 145 MG tablet Commonly known as: TRICOR Take 145 mg by mouth daily.  Indication: High Amount of Triglycerides in the Blood   levETIRAcetam 500 MG tablet Commonly known as: KEPPRA Take 1 tablet (500 mg total) by mouth 2 (two) times daily.  Indication: Seizure   levothyroxine 75 MCG tablet Commonly known as: SYNTHROID Take 75 mcg by mouth daily before breakfast.  Indication: Underactive Thyroid   lithium carbonate 150 MG capsule TAKE 1 CAPSULE BY MOUTH AT BEDTIME   Indication: Hypomanic Episode of Bipolar Disorder   lubiprostone 24 MCG capsule Commonly known as: AMITIZA Take 24 mcg by mouth 2 (two) times daily with a meal.  Indication: Constipation caused by Irritable Bowel Syndrome   metFORMIN 500 MG 24 hr tablet Commonly known as: GLUCOPHAGE-XR Take 500 mg by mouth 2 (two) times daily with a meal.  Indication: Body Weight Gain due to Antipsychotic Medication Use   risperiDONE 2 MG tablet Commonly known as: RISPERDAL Take 1 tablet (2 mg total) by mouth at bedtime for 2 days. What changed:  medication strength how much to take when to take this  Indication: Hypomanic Episode of Bipolar Disorder   tamsulosin 0.4 MG Caps capsule Commonly known as: FLOMAX Take 1 capsule (0.4 mg total) by mouth daily.  Indication: Benign Enlargement of Prostate   traZODone 50 MG tablet Commonly known as: DESYREL Take 1 tablet (50 mg total) by mouth at bedtime as needed for sleep.  Indication: Trouble Sleeping   VITAMIN B-12 IJ Inject as directed every 30 (thirty) days.  Indication: Vitamin Deficiency        Follow-up Information     [facility] Follow up.   Why: Monday-Friday 8:00 am- 5:00 pm Contact information: [address] [place] [address] [phone]                 Follow-up recommendations:  Activity:  as tolerated Diet:  heart healthy diet Bipolar affective disorder, depressed, severe without psychosis: Clozaril 300 mg daily, Clozaril blood testing, awaiting results Risperdal 2 mg at bedtime for two more doses then discontinue per his provider, Dr Jennelle Human.   Anxiety: Klonopin 0.5 mg BID    Insomnia: Trazodone 50 mg daily at bedtime PRN  Comments:  follow up with outpatient resources  Signed: Nanine Means, NP 04/24/2023, 1:28 PM

## 2023-04-24 NOTE — Plan of Care (Signed)
Patient is to be discharged home today - awaiting ride with family.  Clothes collected and belongings from lockers obtained.  Will continue to monitor.

## 2023-04-25 ENCOUNTER — Telehealth: Payer: Self-pay | Admitting: Psychiatry

## 2023-04-25 NOTE — Telephone Encounter (Signed)
After our conversation last week how do I need to proceed?

## 2023-04-25 NOTE — Telephone Encounter (Signed)
Mom, Patsy, called at 10:00 to report that Albert [name] just got out of the hospital.   They changed his medications and she needs to go over these changes.  Please call.  Next appt 05/25/23

## 2023-04-29 ENCOUNTER — Telehealth: Payer: Self-pay

## 2023-04-29 NOTE — Telephone Encounter (Addendum)
Mom called to provide updates on patient. He was changed to clonazepam from Ativan. He was prescribed trazodone 50 mg, took a couple of days but hasn't needed it since.   Her main concern was an elevated Clozaril level. He had CBC drawn on 12/19. I do not know if result has been put in [facility] yet. She said he had 24 tablets left, currently taking 3 per night. She wants to make sure he can get a RF when needed. He will have CBC again next week.   He was also given a couple of names for counselors, but has not followed up on that yet. Told mom that you could see his records from hospitalization.

## 2023-04-29 NOTE — Telephone Encounter (Signed)
I've read the labs and DC summary and the med list.  Looks like he is on 300 mg clozapine which is half the dose he took in the past so I think that is fine.  Clozapine blood levels are not absolutes but rather offer guidance about whether enough is being given or not.  I agree with his hospital doctors that this dose is fine.  Still needs to get levels every 2 weeks until the end of March and if he stays on it he can then go to monthly labs. Last CBC was 12-19 so he can get next one late next week.

## 2023-05-02 ENCOUNTER — Other Ambulatory Visit: Payer: Self-pay | Admitting: Psychiatry

## 2023-05-02 DIAGNOSIS — F314 Bipolar disorder, current episode depressed, severe, without psychotic features: Secondary | ICD-10-CM

## 2023-05-02 DIAGNOSIS — Z79899 Other long term (current) drug therapy: Secondary | ICD-10-CM

## 2023-05-02 NOTE — Telephone Encounter (Signed)
Spoke with Albert [name]. He will get CBC tomorrow. He is aware we are not in the office Wednesday and will view results on Thursday. Told him that Dr. Jennelle Human has reviewed his hospital records and is in agreement.

## 2023-05-03 ENCOUNTER — Telehealth: Payer: Self-pay | Admitting: Psychiatry

## 2023-05-03 ENCOUNTER — Other Ambulatory Visit: Payer: Self-pay | Admitting: Psychiatry

## 2023-05-03 DIAGNOSIS — F314 Bipolar disorder, current episode depressed, severe, without psychotic features: Secondary | ICD-10-CM

## 2023-05-03 MED ORDER — CLOZAPINE 100 MG PO TABS: 300.0000 mg | ORAL_TABLET | Freq: Every day | ORAL | 1 refills | Status: DC

## 2023-05-03 MED ORDER — CLONAZEPAM 0.5 MG PO TABS: 0.5000 mg | ORAL_TABLET | Freq: Two times a day (BID) | ORAL | 0 refills | Status: DC

## 2023-05-03 NOTE — Telephone Encounter (Signed)
 I'm out sick and won't be calling today.  I'll send in RX

## 2023-05-03 NOTE — Telephone Encounter (Signed)
Mom lvm that she ould like a call back . She needs refills on lees  klonopin and clozapine. Patsy will explain more when you call her. Phone number [phone]

## 2023-05-04 LAB — CBC WITH DIFFERENTIAL/PLATELET
Basophils Absolute: 0.1 10*3/uL (ref 0.0–0.2)
Basos: 1 %
EOS (ABSOLUTE): 0 10*3/uL (ref 0.0–0.4)
Eos: 0 %
Hematocrit: 39.6 % (ref 37.5–51.0)
Hemoglobin: 12.8 g/dL — ABNORMAL LOW (ref 13.0–17.7)
Immature Grans (Abs): 0 10*3/uL (ref 0.0–0.1)
Immature Granulocytes: 0 %
Lymphocytes Absolute: 1.1 10*3/uL (ref 0.7–3.1)
Lymphs: 23 %
MCH: 29.9 pg (ref 26.6–33.0)
MCHC: 32.3 g/dL (ref 31.5–35.7)
MCV: 93 fL (ref 79–97)
Monocytes Absolute: 0.6 10*3/uL (ref 0.1–0.9)
Monocytes: 12 %
Neutrophils Absolute: 3.2 10*3/uL (ref 1.4–7.0)
Neutrophils: 64 %
Platelets: 391 10*3/uL (ref 150–450)
RBC: 4.28 x10E6/uL (ref 4.14–5.80)
RDW: 13 % (ref 11.6–15.4)
WBC: 5 10*3/uL (ref 3.4–10.8)

## 2023-05-12 ENCOUNTER — Telehealth: Payer: Self-pay | Admitting: Psychiatry

## 2023-05-12 NOTE — Telephone Encounter (Signed)
 Called patient and he forgot that I had talked to him last week and everything was straightened out.

## 2023-05-12 NOTE — Telephone Encounter (Signed)
 I called pt to make follow up appt for March per Dr Jennelle Human.  Pt said he has left messages for someone to call him back about his medicines. He's having suicidal and homicidal thoughts.  Next appt 1/22

## 2023-05-18 ENCOUNTER — Other Ambulatory Visit: Payer: Self-pay | Admitting: Psychiatry

## 2023-05-19 ENCOUNTER — Other Ambulatory Visit: Payer: Self-pay | Admitting: Psychiatry

## 2023-05-19 LAB — CBC WITH DIFFERENTIAL/PLATELET
Basophils Absolute: 0.1 10*3/uL (ref 0.0–0.2)
Basos: 1 %
EOS (ABSOLUTE): 0.1 10*3/uL (ref 0.0–0.4)
Eos: 2 %
Hematocrit: 39.6 % (ref 37.5–51.0)
Hemoglobin: 13.1 g/dL (ref 13.0–17.7)
Immature Grans (Abs): 0 10*3/uL (ref 0.0–0.1)
Immature Granulocytes: 1 %
Lymphocytes Absolute: 2.5 10*3/uL (ref 0.7–3.1)
Lymphs: 44 %
MCH: 31 pg (ref 26.6–33.0)
MCHC: 33.1 g/dL (ref 31.5–35.7)
MCV: 94 fL (ref 79–97)
Monocytes Absolute: 0.4 10*3/uL (ref 0.1–0.9)
Monocytes: 7 %
Neutrophils Absolute: 2.7 10*3/uL (ref 1.4–7.0)
Neutrophils: 45 %
Platelets: 530 10*3/uL — ABNORMAL HIGH (ref 150–450)
RBC: 4.22 x10E6/uL (ref 4.14–5.80)
RDW: 13.1 % (ref 11.6–15.4)
WBC: 5.7 10*3/uL (ref 3.4–10.8)

## 2023-05-19 NOTE — Telephone Encounter (Signed)
Lf 1/4 for 15 day supply due 1/19

## 2023-05-25 ENCOUNTER — Ambulatory Visit: Payer: Medicare PPO | Admitting: Psychiatry

## 2023-05-25 ENCOUNTER — Encounter: Payer: Self-pay | Admitting: Psychiatry

## 2023-05-25 DIAGNOSIS — F411 Generalized anxiety disorder: Secondary | ICD-10-CM | POA: Diagnosis not present

## 2023-05-25 DIAGNOSIS — Z79899 Other long term (current) drug therapy: Secondary | ICD-10-CM | POA: Diagnosis not present

## 2023-05-25 DIAGNOSIS — F314 Bipolar disorder, current episode depressed, severe, without psychotic features: Secondary | ICD-10-CM

## 2023-05-25 DIAGNOSIS — F4001 Agoraphobia with panic disorder: Secondary | ICD-10-CM

## 2023-05-25 DIAGNOSIS — F5105 Insomnia due to other mental disorder: Secondary | ICD-10-CM

## 2023-05-25 DIAGNOSIS — E538 Deficiency of other specified B group vitamins: Secondary | ICD-10-CM

## 2023-05-25 DIAGNOSIS — F428 Other obsessive-compulsive disorder: Secondary | ICD-10-CM

## 2023-05-25 DIAGNOSIS — G3184 Mild cognitive impairment, so stated: Secondary | ICD-10-CM

## 2023-05-25 DIAGNOSIS — K117 Disturbances of salivary secretion: Secondary | ICD-10-CM

## 2023-05-25 MED ORDER — LITHIUM CARBONATE 150 MG PO CAPS: 150.0000 mg | ORAL_CAPSULE | Freq: Every day | ORAL | 0 refills | Status: DC

## 2023-05-25 MED ORDER — CLONAZEPAM 0.5 MG PO TABS: 0.5000 mg | ORAL_TABLET | Freq: Two times a day (BID) | ORAL | 1 refills | Status: DC

## 2023-05-25 NOTE — Patient Instructions (Addendum)
Call back about whether taking risperidone 2 mg or not.   [email or web address] If needed call Dr. Ladonna Snide office to see if her office knows of a good OCD therapist in the area.

## 2023-05-25 NOTE — Progress Notes (Signed)
Albert [name] [id number] 1966-03-15 58 y.o.    Subjective:   Patient ID:  Albert [name] is a 58 y.o. (DOB [birth year]) male.  Chief Complaint:  Chief Complaint  Patient presents with   Follow-up   Depression   Anxiety   Medication Reaction    Depression        Associated symptoms include decreased concentration, fatigue, appetite change and suicidal ideas.  Past medical history includes anxiety.   Anxiety Symptoms include decreased concentration, dizziness, nervous/anxious behavior and suicidal ideas. Patient reports no chest pain or palpitations.      Albert [name] presents to the office today for follow-up of mixed bipolar, anxiety and still grieving stress of breakup.  He requires frequent follow-up because of long-term symptoms.  At visit October 25, 2018.  We made several medicine changes because of his concerns about sleep and other issues.  We increased olanzapine back to 7.5 mg bc not sleeping as well at 5 mg.  He has to have the prescription for quetiapine written for up to 3 tablets at night in case insomnia is worse because insomnia makes his mood disorder so much worse.  We discussed that was above the usual max but the request was granted given his treatment resistant status. We also reduce lithium from 900 mg daily to 750 mg daily to try to reduce tremor and muscle twitches.  At  visit March 28, 2019.  Fluoxetine was increased to 40 mg daily.   Early January 2021 increased to 60 mg daily.  No SE.  seen June 28, 2019.  No further meds were changed except olanzapine was increased back to 10 mg daily to see if if he could get additional benefit per his request.  Covid vaccinated.  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  Then had cholecystectomy.  April 2021 appt with the following noted: 2 episodes night sweats lately.  Memory has been very bad lately.  Repeatedly asks mother questions. Still  tend to stay in his room and his bed.  Still has rapid cycling mood swings.  Maybe some  better with increase in olanzapine to 10 and tolerating itl.  Would like to get out more but can't DT Covid.  Can perform necessary chores.  Will get out of the house when he can.  No change in death thoughts and anxiety in intensity but is better with frequency.  Usually comes and goes in waves but more persistent.  Consistent with meds.  Ativan not helping anxiety very dramatically but he's not sure.  Fidgety.  No trigger other than still grieving relationship and can't get it out of his head.  Poor energy, concentration and more forgetful. In bed more and less active.  Sleep ok lately which is unusual.  Can concentrate on financial matters and stock market at times.  Tolerating meds.  Still some intrusive SI without reason. Less frequent obsessive thoughts about broken relationship and has been doing this for months.  Can't let it go.  Loop.   No unusual stress even with the family who is supportive. Likes the benefit that Zyprexa gives. No sleepwalking nor falling nor odd behavior.  No history of sleep walking.  He understands this may recur with an increase.  Also wants option to rarely take extra quetiapine 300 for sleep prn. Plan:  Try to reduce lorazepam if possible.  10/22/2019 appointment with the following noted: Continues fluoxetine 60, lithium 600 mg, lorazepam 2 mg AM and HS and 1 mg midday,  olanzapine 10, quetiapine 600 mg HS. Still anxious chronically and including driving in crowded spaces.  Doesn't thing Ativan helps as well as Xanax but less cognitive problems. Sleep is not as good.  Occ EFA.  More EMA and wanting to do things in the middle of the night but this is not typical.  Wonders about why that happens.  Some napping.   Tolerating meds.  Asks about weight loss meds.  Disc this in detail.   Plan no changes except OK meclizine prn vertigo.  02/11/20 appt with the following noted: Able to gradually reduce lorazepam to 1 mg AM and HS. More depressed over time and less interested  in things and less interest in going out but does with his parents. Has reduced from 800 to 600 mg HS with some awakening but usually able to go back to sleep.[name]  Spending a good amount of time in bed bc watches TV in bedroom.  Parents watch TV in different part of the house.  No mood swings he notices.   Concerns about weight gain about 200#. Likes olanzapine's benefit for sleep. Plan: Trial for TRD to  Increase fluoxetine to 80 mg daily  to use the combo with olanzapine for TR bipolar depression.   04/21/20 appt with following noted: I thought in beginning some benefit with fluoxetine.  Lifelong negative thinking continues.   Not much bipolar.  Reads a lot on bipolar.   Still tired and anxious a little more may be seasonal.  No familial stressors.  Tends to lay down in afternoon.  Anxiety not over anything in particular.   No SE with fluoxetine. Took meclizine prn.  Easily motion sick.    Asks questions about newer drugs for bipolar like Caplyta. Plan:  No med changes  06/30/20 appt noted: Tired of wearing masks.  Vaccinated.  Asked questions about when this will end with mask mandate.  Mood up and down some since here and getting up 2-3 times.  Disc awakening problems.  Trying to do better bc night eating some.   Mood is about average today so far.  Albert [name] out with friends for breakfast.  Recognizes activity helps mood.   3 days in a row napped a lot in the afternoon.   Bed is a comfort place.  Gets bored and hard to motivate.  Tries to stay away from night eating and spending.   More depressed when alone and wonders about med changes. Plan: Failed response to olanzapine + fluoxetine for TR bipolar depression.   Reduce fluoxetine to 1 daily and reduce olanzapine to one half nightly for 10 days and then stop it. Then start Caplyta 1 daily  08/27/2020 appointment with the following noted: Patient decompensated with the above switch to Caplyta with intrusive suicidal thoughts and had to be  hospitalized for psychiatric reasons.  Multiple phone calls with family members since that time.  Spoke with the psychiatric nurse practitioner with the decision to restart olanzapine in place of the Caplyta given the patient was more stable while on the combination of Seroquel and olanzapine than with the Caplyta. Caplyta triggered HI/SI and depressed and confused on it, and agitated and still has some of it now. No akathisia. Frustrated with lack of therapy at the hospital.   Hydroxyzine didn't help jitteriness.  Feels some better.  Fleeting SI & HI but not obsessive like it was prior to hospitalization.  Feels a little amped up and nervous.  Scared of what could happen.  Apprehensive being  here talking about things. 5-6 hours sleep last night and would like to have more. At mom and dad's house right now and up more in the day. Inadvertently stopped lorazepam abruptly by mistake likely contributing to shakiness. Still some memory issues.  Trying to stay out of bed when watching TV.  Some awakening but managing. Occ fleeting SI and distracts himself.   Always spends a lot of time just laying around.   Can have anxiety for no reason like coming here, and varies in intensity without pattern.      Less panic than in the past.  Some chronic depression and hyperactivity and loudness and hyperverbal.  Usually back to sleep.  Total 6 hours but some napping, awakens 2-4 times nightly but back to sleep..  Taking quetiapine 600 mg HS.  still lacks interest and motivation.  Can follow a TV show if interested. Plan: Restart lorazepam 1 mg in the morning, 1 mg in the afternoon and 1 mg at night for anxiety Stop hydroxyzine  Increase olanzapine to 1 and 1/2 of 10 mg tablets in evening  09/05/2020 appt noted: seen with parents today at his request Made med changes noted and tolerated the changes Better than I did last week. Now only fleeting HI/SI and "no where near what it was".  Sleeping better.  Still not a lot of  energy.  Anhedonia.  Less agitation.  A lot of anxiety all the time but it's helping.  Would like more energy and motivation.   Doesn't handle stress well. Parents note he's less shakey.  He's still staying with his parents.  Only driven once since this happened and F wants him to be able to drive and function more independently.  M agrees he's better.  Memory is better but not normal.  Primarily STM problems No akathisia with olanzapine this time.    Administering his own meds but mo watched. Slept 8 hours last night. Plan consider further reductions in quetiapine  10/17/20 appt noted: Stayed on Seroquel 600 HS and olanzapine 15. Olanzapine seemed to do most for the sleep.  Also helping eliminated HI/SI for the most part.  Wants to try to increase it.  Depression is much better with less rumination also.  Fears akathisia at higher dose as in the past. Also on fluoxetine 40.   Plan: Reduce Seroquel to 1 and 1/2 tablets at night Increase olanzapine to 1 of the 15 mg tablet and 1/2 of the 5 mg tablet for 1 week,  Then, if tolerated increase the olanzapine to 20 mg or 1 of the 15 mg tablets and 1 of the 5 mg tablets. If possible then also reduce quetiapine to 1 tablet at night.  11/05/2020 appointment with the following noted: Up to olanzapine 20 mg hs for 3 days.  Reduced Serooquel to 450 mg HS.  Didn't try to go lower. Had hiatal hernia and umbilical hernia surgery recently with no problem.   No akathisia so far. Sleep ok with meds so far changes.  No mood changes yet but overall likes the smoothness of olanzapine and helping sleep without knociing him out. Still some occ HI/SI, he thinks bc of anxiety generally.  Easily anxious. Plan: Continue olanzapine 20 mg nightly longer to give it time to help mood sx reduce quetiapine to 1 tablet at night to minimize polypharmacy and reduce risk of akathisia.  01/07/21 appt noted: Able to reduce quetiapine 300 mg HS ok with decent sleep but still wakes a  lot.  No AM hangover.  Don't have a lot to do.  Can enjoy going out and doing things.  But doesn't do it longterm.  Family is doing good.  Father started year 21 at his job.   Tolerating the meds well.  May wake more with quetiapine reduction. Lost 10#. Wonders if could try other meds he didn't tolerate before bc now can tolerate olanzapine and couldn't tolerate it in the past DT akathisia. Mood never great but can swing into depresssion without reason.   Plan: Continue olanzapine 20 mg nightly longer to give it time to help mood sx reduce quetiapine to 1/2 of the 300 mg  tablet at night to minimize polypharmacy and reduce risk of akathisia.  02/02/2021 appointment with the following noted: Tried reduced quetiapine to 150 mg and after a week had withdrawal sx including jittery and SI and he increased to 250 mg daily.  Felt better after increasing to 300 mg daily and last week reduced to 250 mg daily.  After increase quetiapine felt better in a few days.  Seems more alert with less quetiapine but can't tolerate a quick withdrawal.   Some awakening.  Plan: Reduce quetiapine by 50 mg every 2-4 weeks.  03/09/2021 phone call: Pt called and said that he is weaning off the seroquel. He was down to 200 mg of the seoquel and he started having withdrawal symptoms and sucidal thoughts. He went back up to 250 mg because he knew he was good on that.    Pt stated he starting back taking 250 mg on Saturday due to the symptoms he was having.Now he is no longer having suicidal thoughts but still very jittery. MD: He may just need to taper slower than most people.  If he can tolerate the 250 mg Seroquel with the jitteriness it should get better over the next 1 to 2 weeks.  Then he can try going down again by 50 mg at a time.  He probably needs to go down by just 50 mg every 2 to 4 weeks and wait till he fully adjusts to each reduction before he reduces again.  03/17/21 appt noted: Hernia surgery 2 weeks ago. Seroquel  250 mg for a month then 200 mg daily and had SI and increased again to 300 mg daily.  Would love to get off it.  Withdrawal triggers intrusive HI/SI but otherwise doesn't have them No SE. Sleep is OK but not enough REM sleep.  Satisfied with other meds.   No current HI/SI.  No paranoia.  Still has anxiety and taking Ativan every 6 hours.  It works but doesn't cure it. Chronic anxiety and ask about ketamine for chronic depression. Plan: Because he had WD reducing from 250 to 200 mg daily will have to go slowly. Reduce quetiapine by 25 mg every 2-4 weeks. Reduce Seroquel to 250 mg for 2 weeks, Then reduce to 225 mg for 2 weeks, Then reduce to 200 mg for 2 weeks, Then reduce to 175 mg for 2 weeks, Then reduce to 150 mg Also: Buspirone 30 mg tablets for anxiety Start 1/3 tablet twice daily for 1 week Then increase to two thirds twice daily for 1 week Then increase to 1 tablet twice daily  04/15/2021 appointment with the following noted: Reduced Seroquel to 200 mg HS and increased buspirone to 30 mg BID STM px and wants to try to reduce Ativan to see if it is better.  But fears with drawal sx. Parents complain about his memory.  Less HI and SI since here. Less depressed. Plan: Buspirone 30 mg tablets for anxiety Start 1/3 tablet twice daily for 1 week Then increase to two thirds twice daily for 1 week Then increase to 1 tablet twice daily Continue olanzapine 20 mg nightly longer to give it time to help mood sx Because he had WD reducing from 250 to 200 mg daily will have to go slowly. Reduce quetiapine by 25 mg every 2-4 weeks. Reduce Seroquel to 250 mg for 2 weeks, Then reduce to 225 mg for 2 weeks, Then reduce to 200 mg for 2 weeks, Then reduce to 175 mg for 2 weeks, Then reduce to 150 mg Trial taper lorazepam: In hopes of improving short-term memory  04/30/2022 phone call: Complaining of withdrawal symptoms reducing lorazepam from 0.5 mg 5 times a day to 0.5 mg 4 times  daily.  05/21/2021 appointment with the following noted: Gotten onto buspar 30 BID and down on lorazepam to 0.5 mg QID. Gotten down to Seroquel 200 mg HS A little restless every now and then but able to reduce lorazepam for the last weeks. Is too inactive and lays in bed too much watching TV.   Can' t tell effect of buspirone bc of other changes.  Sleep with awakening with 6-7 hours. Fleeting SI without trigger or plan or intent. Depressed more than anxious. Enjoys watching stocks and investing. Plan: Trial taper lorazepam gradually over time In hopes of improving short-term memory But for now continue lorazepam 0.5 mg QID Start clonidine 0.1 mg tablets one half at night for 3 nights, then 1/2 tablet twice daily for 3 nights, then one half in the morning and 1 tablet at night If clonidine does not noticeably help anxiety call the office  05/27/2021 phone call initially complaining of homicidal and suicidal thoughts after taking 1 mg of clonidine.  He was told to stop the medication.  But then called back stating he was having homicidal and suicidal thoughts from withdrawal from the medication which was not logical.  He agreed it was not logical and stated on 05/29/2021 that he felt better  06/23/2021 appointment the following noted: No clonidine. Sunday panic attack eating out and then having SI.  Stayed with father to feel safer. Intrusive thoughts of hurting parents or others.  HI only when has SI.  Usually panic without SI.  Then had intrusive thoughts about hurting others.   Usually intrusive thoughts are brief and fleeting.    07/20/21  appt noted; More anxious with less lithium.  More anxious in his body.  Not more depressed or irritable.  Sleep is the same.  More racing thoughts.  Chronic SI probably a litte worse but not unmanageable. Rduced lithium last week to 300 mg daily. No recent change in lisinopril. Off the clonidine for a week or so. Doesn't drink much water.  Drinks a lot of  diet coke. No marked akathisia or RLS but does shake his leg DT anxiety. Worries about needing mor emedicine. Plan: Increase olanzapine to 25 mg nightly bc more racing thoughts and anxiety with less lithium DT severity of sx and difficulty getting off Seroquel conside rincrease.  07/30/21 TC : CO racing SI today and wonders about whether increased sx racing thoughts, hyperactivity, SI related to Reduced lithium from 600 mg daily to 300 mg daily recently DT lithium level 1.8.  could be related.    Plan: increase olanzapine 30 mg pm Today take lithium 600 mg now.  Tomorrow start lithium CR  450 mg daily Continue olanzapine 30 mg PM until SI resolves unless akathisia returns.  Meredith Staggers, MD, DFAPA  08/17/21 appt noted:  No akathisia with olanzapine 30 mg pm (about 3 weeks) and is sleeping better. Increased lithium to 450 mg daily. Tolerating meds. Still racing negative thoughts maybe some better.  Asked about it. SI come and go.   Anxiety about the same as depression.  No motivation unless has to do something. Plan: Reduce  fluoxetine 20 mg 1 daily in hopes racing thoughts and anxiety are better.  09/14/2021 appointment with the following noted: No difference noted with reduction fluoxetine 20 mg daily. Most of the time negative thoughts, ex "what if I get in a wreck?"  Frequent negative thoughts.   Still on lorazepam 0.5 mg TID.   Wonders about trying prior meds that caused akathisia bc no longer gets it with olanzapine 30 mg daily. Sleep is usually ok.  Taking Seroquel 200 mg HS.  Thinks the olanzapine helps his sleep and doesn't want to stop it. Panic 1-2 times per month.  Then takes extra lorazepam. Plan: DC fluoxetine 20 mg 1 daily  Trial Auvelity off label for depression 1 in the AM for 1 week then 1 in AM and 1 with evening meal  10/15/2021 appointment with the following noted: Occ of getting confused as to day and went to church on a Thursday my mistake.   Had it happen a few  months ago too.  Memory is terrible. Thinks he couldn't tolerate more Auvelity. Not a lot of benefit from [place] 1 daily. Still quite a bit of anxiety.   A little drowsy  and sleeps 7-8 hours at night. Word finding problems.  Lost wallet. Plan: DC  Auvelity  For [facility]: memantine 1/2 tablet in the AM for 1 week, then 1/2 tablet twice daily, then 1/2 tablet in the AM and 1 tablet at night for 1 week then 1 tablet twice daily  10/22/2021 phone call complaining of additional stress asking to start new medication because of anxiety and more suicidal thoughts without intent or plan.  Reports taking more than 4 prescribed Ativan a day recently.  Asking to increase olanzapine which is already at 30 mg daily. MD response:We cannot go any higher in the dosage of olanzapine that he is currently taking.  A lot of these suicidal thoughts are apparently anxiety driven.  Let us have him resume taking paroxetine which is one of the more effective medicines for anxiety.  I will send it in and the instructions will be paroxetine 20 mg tablets 1/2 tablet daily for 1 week then 1 tablet daily  11/16/21 appt noted: Multiple phone calls since she was here.  Complaining of suicidal thoughts from memantine which was stopped. Started paroxetine and up to 20 mg daily.  Continues olanzapine 30 mg, lithium 450 mg nightly, lorazepam 0.5 mg 4 times daily , And quetiapine 200 mg nightly. Questions about whether memantine caused the neg SI really bc has them at times. When has SI often anxiety driven.  Will tend to ruminate on past relationship ended.   Edgy and irritable all the time but memantine seemed to make it worse. Mind is getting better with less SI lately.   No SE with paroxetine.   Aggrivated by forgetfulness.  eXample, talked with someone about going to breakfast last night and then forgot to go this mornin.g. Plan prescribed donepezil 5 mg daily for cognitive complaints specifically forgetfulness  12/31/2021  appointment with the  following noted: He had been prescribed donepezil 5 mg for cognitive complaints.  He called back reporting he felt it was causing homicidal thoughts and was instructed to stop it.  He has had these types of thoughts in the past.  He had no desire to act on them. Discouraged. Ongoing depression and reduced enjoyment and negative thoughts.  Can enjoy some things. A lot of anxiety chronically.  Trying to limit Ativan to 5 of 0.5 mg daily.  Sometimes needs 2. Constipation for years.  02/03/22 appt noted: Depression makes LBP worse. Ongoing chronic depression. Tolerated rivastigmine without triggering SI Chronic anxiety and Ativan helps some. Occ panic but not usual.  Asks if any other meds coud be used. Plan: But for now continue lorazepam 0.5 mg QID, but use LED for cog reasons Increase rivastigmine to 3 mg capsule 1 twice daily or 2 of the 1.5 mg capsules twice daily.  Call if you have problems with nausea Start dextromethorphan 1 capsule twice daily . Thi sis off label for depression based on MOA of Auvelity and use of paroxetine to prolong half life of DM. Presence of paroxetine may have been reason he didn't tolerate Auvelity brief trial before.  Will proceed slowly.  03/17/2022 appointment noted: Current psych meds: Rivastigmine 3 mg twice daily, quetiapine 200 mg nightly, paroxetine 40 mg daily, olanzapine 30 mg nightly, Namenda 10 mg daily, lorazepam 0.5 mg 4 times daily, lithium CR 450 mg nightly, dextromethorphan 15 mg twice daily A lot of anxiety since here.  Some days taken more lorazepam to 6-7 tablets daily.  No major triggers but anxious even about coming here and other normal things.  Cancelled trip to collect rocks with friend and cancelled the trip bc anxious.  Usually if can go he is ok.  Lots of SI ongoing Memory might be a little better. Asks how he responded to [employer].  Will check paper chart.  03/24/22 TC:  CO SI worse and restilless with Rivastigmine 9  mg . MD response:  Stop the rivastigmine since the [place] appears to be having side effects from it.  He was having anxiety at the last appointment 2 and then we increased the dose.  This might be causing the anxiety to be worse. He has chronic suicidal thoughts without intent or plan.  However it appears the thoughts are more intense with the rivastigmine     05/06/21 appt noted: Stopped rivastigmine DT anxiety and worsening SI.  Was better the next day. This week had some SI.  Thinking about the prior relationship tends to lead to SI. Will take Ativan when gets SI and it helps a little at 0.5 mg at a time. Plan: Consider fluvoxamine.  Yes this may help with obsessions on old GF and he failed other optioons. Start fluvoxamine 1/2 of 50 mg tablet and reduce paroxetine to 1 and 1/2 of 20 mg tablets for 5 days, Then increase fluvoxamine to 1 tablet daily and reduce paroxetine to 1 tablet daily for 5 days, Then increase fluvoxamine to 1-1/2 tablets daily and reduce paroxetine to 1/2 tablet daily for 5 days, Then increase fluvoxamine to 2 tablets daily and stop paroxetine   06/07/22 appt noted: Feels like the fluvoxamine 50 BID is too much.  On this a couple of weeks. Worries he might be worse with this with regard to SI.    He thinks it's worse with increase.   New neighbor who has a young boy. He worries that Reginal Lutes is triggering depression  and intrusive SI/HI a couple of days later and lasts a little while. Has had anxiety turning into panic attacks.  This gets associated with depression and SI/HI.  These thoughts are intrusive and he doesn't want to act on them.  Can have HI even towards family with whom he has no negative emotions.  Little things like a bump up in parking lot sets off his depression and anxiety and doesn't seem to handle things.   No sig akathisia.  In the past it's been continuous.   Awakens every 2-3 hours.  Usually goes back to sleep with meds. Has not had alcohol in 20 years  and had some intrusive thoughts without drinking.   Plan: As soon as he feels comfortable increase fluvoxamine to 50 mg AM and 100 mg PM for intrusive thoughts of HI/SI and obs on GF  07/06/22 appt noted: Increased fluvoxamine to 150 mg daily and felt more agitated and some SI so reduced to 100 mg 4 days ago and feels better so far.  Likes the fact it is good for anxiety and obs thoughts.  Wonders if there is a similar med.  Frustrated he can't get relief.  Some improvement in obs on exGF but not resolved. Continue other meds: lithium CR 450 mg HS, lorazepam 0.5 mg QID, Memantine 10 BID, olanzapine 30 mg HS, quetiapine 200 mg HS Tingling and burning in legs at night but doesn't interfere with sleep. Got insurance to pay for [facility] and lost 13#.   No full panic lately. But some anxiety waves.   Still major problems with memory. Plan: Wean fluvoxamine 50 mg daily for a month then 1/2 daily for 2 weeks then stop it DT  NR.  Watch for more anxiety.  07/28/22 TC: Albert [name], CMA  to Me     07/28/22  4:16 PM Note Mom called reporting patient has been confused, dizzy, and sluggish for the last 3 days. He is now staying with her. She had several questions in regards to this:   Lithium level - she feels he needs one done, last result in Epic was 08/2021 that I see. She would like order sent to [facility] for tomorrow.   Patient is on Wegovy, has been for about a month, and she wonders if this could cause sx.   Patient has meclizine and she is questioning if she should give that for his dizziness considering other sx.   She questions if perhaps he got up during the night and took additional medications, though doesn't know if this happened and if so what he took.    On 3/5 visit he was to wean fluvoxamine to 50 mg qd for a month and then 1/2 tab for 2 weeks and then stop. Mom and pt unsure if he started the wean.    He has F/U 4/4.        Me  to Albert [name], CMA   CC   07/28/22  5:44  PM Note Yes get lithium level in the AM and don't take any lithium tonight or in the morning before the blood test.     Reginal Lutes should not cause these sx except if he is losing wt quickly or is dehydrated it can cause lithium toxicity and include these sx.  He needs to drink lots of water.   He should go ahead and wean the fluvoxamine as instructed in the last appt.   If the sx get worse, go to the ER.  Me   CC   07/29/22  6:49 PM Note RTC   Sx not as severe as when he had to go to hospital in the past.  Albert [name] got Lithium level today.   Speech is not good .  Confused at times, but better today.  A little tremor .  A little balance problems.  No falls.  No diarrhea Took lihtium 450 last night. Mo says he got up in the middle of the night and moved things around last night and he doesn't rmember.   No change in night time meds.   Gradually reducing fluvoxamine.   No extra lorazepam.     Spoke with mother who verifies the same.   No med changes but push fluids.   Meredith Staggers, MD, DFAPA      08/05/22 appt : with parents Pt hosp 08/02/22 with acute kidney injury and somewhat elevated lithium level with some confusion.  Spoke with psych NP at hosp and lithium was decreased to 450 mg HS. No pending appt known with medical doctors about kidney function. Don't feel good and having HI and SI and feels he needs lithium for these issues.  Doesn't feel he should have been discharged.   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 No added meds at night but has been confused at night and getting up multiple times and doing things he doesn't remember.  This concerns parents.  Parents been up at night with him and he doesn't make sense.  Still groggy in the am per mo for an hour.   Plan: Plan: Get lithium level and kidney test ASAP Stop fluvoxamine Reduce lithium 300 mg capsule 1 at night Monday night: reduce to olanzapine to 20 mg at night (stop the 10 mg tablet) and start clozapine prescription. Monday  4/15 : increase clozapine to 100 mg at night and reduce quetiapine to 1/2 of 200 mg tablet and continue olanzapine 20 mg each evening.  08/01/22 & 08/11/22 ED for HI and SI  08/12/22 TC mother:  Mother, Patsy called. Per note made by Larinda Buttery. He was referred to [facility].Reporting that he did go up there last night. This morning they called her and told her to come pick him up. They told her he wasn't having homicidal or suicidal thoughts and it would be four to five days of him waiting to go to a facility in [place]. They recommended him for PHP to start 4/16. She picked him up today. He is worried about these thoughts coming back up. Also, [facility] came out yesterday to assess him and offered counseling from [facility]. He wants to do counseling with them.  Any input on care he can receive?  He is so discouraged and wants to be safe.  (She notes that the [facility] gave him- Zyprexa 10mg  at 1:07am and again at 1:25am per the med list- based on his previous med list) He was supposed to be off of Zyprexa10mg . Has been on  Clozapine since Monday. He did go ahead and take it before he left ( 2pills) so he hasn't missed a dose and did go get labwork done. Is wanting to continue the Clozapine, please check the labwork to be able to fill the 100mg  dose.    MD resp:  Continue all meds as RX except go ahead and increase clozapine to 100 mg tablets, 1 each night as soon as he can get the 100 mg tablets from pharmacy.  RX sent.  Trying to speed up his chance of  response by getting it to a hgiher dose.  Clozapine is very effective for HI/SI.  If he gets worsening HI/SI in the interim it will not be caused by meds but just the natural waxing and waining of his sx.  Once clozapine dose gets high enough it should help but avg patient needs 300 mg so we have to just be patient.      08/20/22 appt:  Up to 100 mg clozapine HS SE constipation and using Colace, Miralax.  Been to [facility] twice  since here.   Ongoing continuous HI/SI.   All I  want to do is go to sleep to escape.  Dep causing the thoughts.   Taking Ativan 0.5 mg BID-QID.  He thinks mother not giving him enough of it.  He thinks anxiety triggers HI/SI.  He fears HI and hopes he's strong enough to reisist.  Not angry or wanting to hurt anyone in reality but has the intrusive thoughts. Conc and memory still not good.   Sleep not much different.   Staying with parents currently. Dep gotten worse M worrying he's dependent on lorazepam.   Confusion at night is no longer happening.  Not wandering at night.  Plan: For severe constipation start Linzess 145 mg AM Get lithium level and kidney test ASAP Stopped fluvoxamine and confusion at night is gone  lithium 300 mg capsule 1 at night  olanzapine to 20 mg at night  increase clozapine to 150 mg at night for 3 nights then 200 mg HS   olanzapine 20 mg each evening. Continue lorazepam 0.5 mg QID, needs this and informed mother  09/07/22 appt noted: Psych meds: clozapine 250 MG HS, lithium 300 mg HS, lorazepam 0.5 mg BID-QID, olanzapine 20 .  Off Seroquel, linzess 290 mg daily, Miralax, Colace. Sleep 7-8 hours without change from before clozapine.   Mood is still very depressed and still gets anxiety. Still problems with constipation.   Dep is so much it is causing intrusive HI/SI.   No more bizarre behaviors at night.   Plan: Plan: Get lithium level and kidney test ASAP  lithium 300 mg capsule 1 at night  olanzapine to 20 mg at night  increase clozapine to  300 mg HS as soon as tolerated and then 400 mg HS.  olanzapine 20 mg each evening. Continue lorazepam 0.5 mg QID, needs this and informed mother  10/07/22 appt noted: alone and with parents Rough month.  Altered voice for a couple of weeks or so..  Mind not good.  Feel out of sorts.  Jittery.  Constipation.  No appetite.  On Wegovy.  Saw GI doc.   May need to reduce Wegovy bc poor appetite and constipation.  Lost 25 #. Current psych med: clozapine 400 mg HS about a  week or 2, olanzapine 20 mg HS, lithium 300 mg HS, lorazepam 0.5 mg QID. Reduced intrusive thoughts.  2 nights since here night eating.   Sleep 6 hours.  Limited napping during the day.   Anhedonia.  Lower motivation.  Tremor worse. M says he sleeps pretty well but awakens  BP is under control. Plan Reduce lithium to 150 mg capsule 1 at night DT level higher Reduce  olanzapine to 10 mg at night  Continue clozapine 400 mg HS bc helping SI and HI and  Continue lorazepam 0.5 mg QID, needs this and informed mother  11/08/22 appt noted:  alone and with parents. Ongoing calls between visits Doesn't think clozapine helping him sleep as  well as olanzapine for sleep or any other benefit. While at restaurant had intrusive HI/SI. Staying with parents seems to provoke intrusive HI.  Is grateful to them.  Sat woke up with them.  Sometimes goes away and sometimes not.  No know trigger and no intent. Saw PCP.  Off Wegovy.  Started metformin. No effect noted from clozapine.   M limits his lorazepam.  PCP referring him to neuro to FU abnormal head scan.  He wonders if his speech and memory problems are related.   Plan: Cont clozapine 400 mg HS Check clozapine level and then direct dosing.  11/16/22 TC from pt to disc level:  MD resp:  Clozapine level ok but may benefit with increase to 500 mg nightly.  Tell him to increase with current supply.     12/06/22 TC from pt:  complaining of worsening intrusive ego-dystonic HI/SI without intent, desire or plan.   MD resp ok to increase clozapine to 600 mg HS.  12/09/22 appt noted: Complaining of ongoing racing thoughts and worsening HI/SI without intent or desire or plan.  Racing thoughts chronic and other thoughts wax and wane.  Usually triggered if starts thinking of something negative Meds: clozapine 600 mg HS, lithium 150, lorazepam 0.5 mg QID, olanzapine 10 HS Sleep with EFA.   Will have trouble going to sleep sometimes with thoughts. Doesn't think  clozapine makes him sleepy.  Not really drowsy daytime. No dx OSA and no known apnea.  SE a little dizzy.  Constipation managed with BM every 2-3 days. Better than before.  PCP RX metformin and it helped. Plan: Plan for intrusive HI/SI wean olanzapine and trial of risperidone.  Reduce olanzapine to 5 mg HS for 1 week then stop it.  Addressed his fears of not sleeping off olanzapine. Then if sleeping start risperidone.  01/11/23 appt noted:  also seen with parents Psych meds: clozapine 600, lorazepam 0.5 mg QID, No olanzpaine ,  lithium 150 mg daily, no risperidone yet. Lost 45# on Wegovy down to 150#.   Still having intrusive HI/SI as noted before. Sleep no worse.  Takes a couple of hours after clozapine to sleep.  EFA 2-3 times nocturia.   Still racing thoughts.  Ongoing px with conc, memory,  planning Scored 73 on 100 pt test for cognition.   Some trouble with articulation and speech at times.   "If could get rid of racing and intrusive thoughts I'd be 100% better." Neuro says no stroke history and low B12 and workup ongoing at [facility], Dr. Donia Pounds. Memory is still terrible.   Plan: Start risperidone 2 mg tablet, 1 at night for racing thoughts and intrusive homicidal and suicidal thoughts. Call in 3 weeks if not better and we will increase the dose  02/09/23 appt: seen also with parents , 60 min  Lost to 152#. Meds: as above, risperidone incr to 4.5 mg HS for intrusive HI/SI SE drooling off an on.  Tremor mainly worse with a little more risperidone HI/SI intrusive thoughts wax and wane.  Worse when lays down. Takes 2 hours to fall asleep.  Needs to get OOB when awakens.  6 hours sleep. No falls.   Would rather try to increase risperidone than other options.    04/12/23 appt virtual:  seen alone CC:  Multiple phone calls about ongoing intrusive, obsessive, ego dystonic HI/SI thoughts without plan or intent. Didn't come to the office bc of these thoughts.  I know I don't want to do it.  Can  last four hours.  Sees drug advertisements that talk about SI and that will trigger obsessions about it. Meds: clozapine 600 mg HS,  lithium 150 mg HS, lorazepam 0.5 mg TID, risperidone increased to 8 mg and now back to 4mg  HS.   Neuro sched MRI tomorrow. Enough sleep 5-6 hours at night.  Not napping.   Sometimes gets confused with med but not ususally.   No recent mania.   Depressed and anxious. Plan: AVS: Reduce risperidone to 1/2 of 4 mg tablet in evening for 1 week then stop it. Reduce clozapine to 5 tablets nightly Start fluvoxamine 100 mg tablet 1/2 tablet nightly for 1 week then 1 tablet nightly for obsessive, intrusive thoughts  05/25/23 appt in person:  not with parents, met with [facility] with 2 sz and then had psych hosp since here.  Another EEG 06/03/23.  Neuro DR.  Clelia Croft.  RX Keppra.  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 Psych med: clozapine reduced to 300 HS, stopped ? Risperidone he's not sure, clonazepam 0.5 mg BID, lithium 150 HS, trazodone 50 prn rare.  Keppra 500 BID. Trazodone good sleep med. SE drooling again.   Not sure any mental health effect of Keppra.   Denies any street drugs despite positive UDS for ecstasy. Overall still having intrusive HI/SI without intent or plan chronically but maybe is a bit better than before hosp stay. Sleep is better.  Lost 40# on Wegovy.   Decided more even anxiety benefit with clonazepam vs lorazepam.  Anxiety less on clonazepam.  Psych med hx extensive including ECT and  Risperidone Maxed out risperidone 8 mg without benefit for intrusive thoughts  Zyprexa 30 Latuda 80 which caused akathisia,  Vraylar, Rexulti, aripiprazole 20 mg with akathisia,  Seroquel 1000 mg,  InVega, Geodon, Saphris with side effects, symbyax, Fanapt NR .  Caplyta SE and markedly worse. Clozapine started end of March lithium 1200 SE,  lamotrigine 300 mg, Depakote 2000 mg, Tegretol, Trileptal and several of these in  combinations, gabapentin,  N-acetylcysteine, Nuedexta,     Belsomra with no response,  Lunesta no response, trazodone 200 mg,  Xanax, clonazepam, lorazepam less sedation. Buspirone NR Clonidine SE with 2 trials  Viibryd 40 mg for 3 months with diarrhea,  protriptyline with side effects,  Trintellix 20 mg,  Parnate 50 mg with no response,   Emsam 12 mg for 2 months,   imipramine,  venlafaxine,   bupropion was side effects,  Lexapro 20 mg, sertraline, paroxetine, Deplin, fluoxetine 80,  fluvoxamine 150 agitated; retrial ? Sz related.   Auvelity NR at one daily.  SE BID  methylphenidate 60 mg,  Vyvanse, Concerta, strattera, , modafinil,  Memantine worsening SI? Donepezil, complained of HI, Exelon 3 BID rivastigmine DT worsening SI  pramipexole,  amantadine ,  Patient prone to akathisia.  [facility]  HX ECT   Review of Systems:  Review of Systems  Constitutional:  Positive for appetite change and fatigue.  Cardiovascular:  Negative for chest pain and palpitations.  Gastrointestinal:  Positive for constipation.  Musculoskeletal:  Positive for back pain.  Neurological:  Positive for dizziness and tremors.       Fidgety  Psychiatric/Behavioral:  Positive for decreased concentration, depression, dysphoric mood and suicidal ideas. Negative for agitation, behavioral problems, hallucinations, self-injury and sleep disturbance. The patient is nervous/anxious. The patient is not hyperactive.     Medications: I have reviewed the patient's current medications.  Current Outpatient Medications  Medication Sig Dispense Refill   acetaminophen (TYLENOL) 500 MG tablet Take 1,000 mg by mouth every 6 (six) hours as needed for moderate pain.     atropine 1 % ophthalmic solution Place 2 drops under the tongue 3 (three) times daily as needed. 2 mL 1   cholecalciferol (VITAMIN D3) 25 MCG (1000 UNIT) tablet Take 1,000 Units by mouth daily.     Cyanocobalamin (VITAMIN B-12 IJ)  Inject as directed every 30 (thirty) days.     esomeprazole (NEXIUM) 40 MG capsule Take 40 mg by mouth daily.     fenofibrate (TRICOR) 145 MG tablet Take 145 mg by mouth daily.     levETIRAcetam (KEPPRA) 500 MG tablet Take 1 tablet (500 mg total) by mouth 2 (two) times daily. 60 tablet 0   levothyroxine (SYNTHROID, LEVOTHROID) 75 MCG tablet Take 75 mcg by mouth daily before breakfast.     lubiprostone (AMITIZA) 24 MCG capsule Take 24 mcg by mouth 2 (two) times daily with a meal.     metFORMIN (GLUCOPHAGE-XR) 500 MG 24 hr tablet Take 500 mg by mouth 2 (two) times daily with a meal.     tamsulosin (FLOMAX) 0.4 MG CAPS capsule Take 1 capsule (0.4 mg total) by mouth daily. 90 capsule 3   clonazePAM (KLONOPIN) 0.5 MG tablet Take 1 tablet (0.5 mg total) by mouth 2 (two) times daily. 60 tablet 1   cloZAPine (CLOZARIL) 100 MG tablet TAKE 3 TABLETS (300 MG TOTAL) BY MOUTH AT BEDTIME 42 tablet 2   lithium carbonate 150 MG capsule Take 1 capsule (150 mg total) by mouth at bedtime. 90 capsule 0   traZODone (DESYREL) 50 MG tablet Take 1 tablet (50 mg total) by mouth at bedtime as needed for sleep. 30 tablet 0   No current facility-administered medications for this visit.   Medication Side Effects: Other: mild sleepiness.   Occ twitches.\, tremor  Allergies:  Allergies  Allergen Reactions   Meloxicam Other (See Comments)    Dizziness   Zyprexa [Olanzapine] Other (See Comments)    Akathesia   Nsaids Other (See Comments)    Hallucinations   Ibuprofen Other (See Comments)    Can not take because taking lithium   Prednisone Other (See Comments)    Can't sleep    Wellbutrin [Bupropion] Other (See Comments)    Suicidal thoughts    Past Medical History:  Diagnosis Date   ADHD (attention deficit hyperactivity disorder)    Anemia    Anxiety    Bipolar disorder (HCC)    BPH (benign prostatic hyperplasia)    Chronic kidney disease, stage 3b (HCC)    Constipation    DDD (degenerative disc disease),  cervical    Depression    Deviated septum    ED (erectile dysfunction)    GERD (gastroesophageal reflux disease)    Graves disease    History of hiatal hernia    Hypertension    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    Hypothyroidism    Pneumonia    PONV (postoperative nausea and vomiting)     Family History  Problem Relation Age of Onset   Prostate cancer Neg Hx    Bladder Cancer Neg Hx    Kidney cancer Neg Hx     Social History   Socioeconomic History   Marital status: Single    Spouse name: Not on file   Number of children: Not on file   Years of education: Not on file  Highest education level: Not on file  Occupational History   Not on file  Tobacco Use   Smoking status: Former    Current packs/day: 0.00    Types: Cigarettes    Quit date: 2010    Years since quitting: 15.0    Passive exposure: Past   Smokeless tobacco: Former    Types: Engineer, drilling   Vaping status: Never Used  Substance and Sexual Activity   Alcohol use: Not Currently   Drug use: Never   Sexual activity: Yes  Other Topics Concern   Not on file  Social History Narrative   Not on file   Social Drivers of Health   Financial Resource Strain: Low Risk  (11/03/2022)   Received from [facility], [facility]   Overall Financial Resource Strain (CARDIA)    Difficulty of Paying Living Expenses: Not very hard  Food Insecurity: No Food Insecurity (04/17/2023)   Hunger Vital Sign    Worried About Running Out of Food in the Last Year: Never true    Ran Out of Food in the Last Year: Never true  Transportation Needs: No Transportation Needs (04/17/2023)   PRAPARE - Administrator, Civil Service (Medical): No    Lack of Transportation (Non-Medical): No  Physical Activity: Not on file  Stress: Stress Concern Present (08/12/2020)   Received from [facility], [facility] -  Occupational Stress Questionnaire    Feeling of Stress : Very much  Social Connections: Unknown (09/01/2021)   Received from [facility], [facility]   Social Network    Social Network: Not on file  Intimate Partner Violence: Not At Risk (04/17/2023)   Humiliation, Afraid, Rape, and Kick questionnaire    Fear of Current or Ex-Partner: No    Emotionally Abused: No    Physically Abused: No    Sexually Abused: No    Past Medical History, Surgical history, Social history, and Family history were reviewed and updated as appropriate.   Please see review of systems for further details on the patient's review from today.   Objective:   Physical Exam:  There were no vitals taken for this visit.  Physical Exam Constitutional:      General: He is not in acute distress.    Appearance: He is well-developed.  Musculoskeletal:        General: No deformity.  Neurological:     Mental Status: He is alert and oriented to person, place, and time.     Cranial Nerves: No dysarthria.     Motor: Tremor present.     Coordination: Coordination normal.     Comments: mild tremor  Psychiatric:        Attention and Perception: Attention and perception normal. He does not perceive auditory or visual hallucinations.        Mood and Affect: Mood is anxious and depressed. Affect is not labile, blunt, angry or tearful.        Speech: Speech normal. Speech is not slurred.        Behavior: Behavior normal. Behavior is not slowed. Behavior is cooperative.        Thought Content: Thought content is not delusional. Thought content includes homicidal and suicidal ideation. Thought content does not include suicidal plan.        Cognition and Memory: Cognition normal. He exhibits impaired recent memory.        Judgment: Judgment normal.  Comments: Insight fair Chronically talkative .directable. But chronically mildly pressured. He is chronically anxious   No manic signs noted. Some wordfinding problems  ongoing. Intrusive obsessions episodic HI/SI are the CC but is better a little since Dec Alert and oriented.      Lab Review:     Component Value Date/Time   NA 144 04/17/2023 0314   NA 135 10/20/2022 1123   NA 142 03/20/2014 0514   K 4.4 04/17/2023 0314   K 4.1 03/20/2014 0514   CL 109 04/17/2023 0314   CL 112 (H) 03/20/2014 0514   CO2 28 04/17/2023 0314   CO2 26 03/20/2014 0514   GLUCOSE 92 04/17/2023 0314   GLUCOSE 99 03/20/2014 0514   BUN 16 04/17/2023 0314   BUN 22 10/20/2022 1123   BUN 7 03/20/2014 0514   CREATININE 1.43 (H) 04/17/2023 0314   CREATININE 0.78 03/20/2014 0514   CALCIUM 8.7 (L) 04/17/2023 0314   CALCIUM 9.5 03/20/2014 0514   PROT 6.5 04/14/2023 1047   PROT 7.3 03/18/2014 1459   ALBUMIN 3.8 04/14/2023 1047   ALBUMIN 3.2 (L) 03/18/2014 1459   AST 21 04/14/2023 1047   AST 16 03/18/2014 1459   ALT 10 04/14/2023 1047   ALT 16 03/18/2014 1459   ALKPHOS 39 04/14/2023 1047   ALKPHOS 105 03/18/2014 1459   BILITOT 0.3 04/14/2023 1047   BILITOT 0.3 03/18/2014 1459   GFRNONAA 56 (L) 04/17/2023 0314   GFRNONAA >60 03/20/2014 0514   GFRAA 54 (L) 04/18/2019 1612   GFRAA >60 03/20/2014 0514       Component Value Date/Time   WBC 5.7 05/18/2023 1130   WBC 6.5 04/21/2023 0946   RBC 4.22 05/18/2023 1130   RBC 3.92 (L) 04/21/2023 0946   HGB 13.1 05/18/2023 1130   HCT 39.6 05/18/2023 1130   PLT 530 (H) 05/18/2023 1130   MCV 94 05/18/2023 1130   MCV 89 03/20/2014 0514   MCH 31.0 05/18/2023 1130   MCH 30.9 04/21/2023 0946   MCHC 33.1 05/18/2023 1130   MCHC 32.7 04/21/2023 0946   RDW 13.1 05/18/2023 1130   RDW 12.5 03/20/2014 0514   LYMPHSABS 2.5 05/18/2023 1130   LYMPHSABS 3.2 03/20/2014 0514   MONOABS 0.4 04/21/2023 0946   MONOABS 1.0 03/20/2014 0514   EOSABS 0.1 05/18/2023 1130   EOSABS 0.4 03/20/2014 0514   BASOSABS 0.1 05/18/2023 1130   BASOSABS 0.1 03/20/2014 0514    Lithium Lvl  Date Value Ref Range Status  04/14/2023 0.20 (L) 0.60 - 1.20  mmol/L Final    Comment:    Performed at [facility], [address] [place], [place] [address]   08/12/21 lithium level 0.9 on 450 mg.  lithium level Sept 0.8.   lithium level July 27, 2018 was normal at 1.0.   Lithium level [facility] October 03, 2018 = 1.2. Said he got lithium level at [facility] as requested.  Labs not in Epic.  Recent lipids ok except higher TG than usual.  Normal A1C.  11/10/22 Checked clozapine level on 400 mg HS= cloz 733+norclozapine 294 = total 1027  .res Assessment: Plan:    Smiley was seen today for follow-up, depression, anxiety and medication reaction.  Diagnoses and all orders for this visit:  GAD (generalized anxiety disorder) -     clonazePAM (KLONOPIN) 0.5 MG tablet; Take 1 tablet (0.5 mg total) by mouth 2 (two) times daily.  Severe bipolar I disorder, current or most recent episode depressed (HCC) -  clonazePAM (KLONOPIN) 0.5 MG tablet; Take 1 tablet (0.5 mg total) by mouth 2 (two) times daily. -     lithium carbonate 150 MG capsule; Take 1 capsule (150 mg total) by mouth at bedtime.  Lithium use -     lithium carbonate 150 MG capsule; Take 1 capsule (150 mg total) by mouth at bedtime.  Panic disorder with agoraphobia -     clonazePAM (KLONOPIN) 0.5 MG tablet; Take 1 tablet (0.5 mg total) by mouth 2 (two) times daily.  Insomnia due to mental condition  Other obsessive-compulsive disorders -     clonazePAM (KLONOPIN) 0.5 MG tablet; Take 1 tablet (0.5 mg total) by mouth 2 (two) times daily.  Mild cognitive impairment  Long term current use of clozapine  Sialorrhea  Low serum vitamin B12     60 min appt with pt  needs a lot of time   .  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  He usually has mixed bipolar symptoms which we have not been able to completely eliminate.  See long list of meds tried.   He is  chronically unstable .  Mood is relatively stable except for  the severe intrusive, ego-dystonic persistent obsessive HI/SI without intent or plan. Chronic frequent ph calls between appts often about the same issue repeatedly most recently re: intrusive ego dystonic thoughts HI/SI.  More like obsessions than true HI/SI.  Ongoing Multiple phone calls about ongoing intrusive, obsessive, ego dystonic HI/SI thoughts without plan or intent.  No recent mania.  Chronic dep and anxiety.  Psych hosp 08/02/22 with SI and acute kidney injury and elevated lithium. Processed this and disc goal of trying to stop lithium but risk of worsening SI.  He has chronic SI and there's risk of this worsening off lithium.  Lithium and clozapine are the most effective meds for SI.    Then recent hosp for SZ episodes.  Fluvox stopped and clozapine reduced to 300 mg HS. Records reviewed with pt and his brother.  Was RX risperidone but he stopped that after DC.  It was ineffective in at least 2 trials.  We discussed the short-term risks associated with benzodiazepines including sedation and increased fall risk among others.  Discussed long-term side effect risk including dependence, potential withdrawal symptoms, and the potential eventual dose-related risk of dementia.  But recent studies from 2020 dispute this association between benzodiazepines and dementia risk. Newer studies in 2020 do not support an association with dementia.  Unfortunately due to the severity of set his symptoms polypharmacy is a necessity.    Lithium being used bc chronic SI and death thoughts.  Counseled patient regarding potential benefits, risks, and side effects of lithium to include potential risk of lithium affecting thyroid and renal function.  Discussed need for periodic lab monitoring to determine drug level and to assess for potential adverse effects.  Counseled patient regarding signs and symptoms of lithium toxicity and advised that they notify office immediately or seek urgent medical attention if  experiencing these signs and symptoms.  Patient advised to contact office with any questions or concerns.  Reduced lithium to 150 mg daily Dt hx toxicity Checked lithium level and BMP and level 1.1 on 300 mg Daily so reduced again to 150 mg daily and level 0.4 Call if death thoughts worsen or worsening SI.  Disc clozapine and it's level..  Disc data showing less SI with it but he's had no  changes.    Disc risk in detail including low WBC with complication, myocarditis.  Extensive discussion of CBC monitoring.  Disc going higher despite level and checking ECG bc cardiac risk at higher doses.   Disc this again bc taking Wegovy might help him to tolerate it better.   clozapine 600 mg HS was Started 12/07/22. Started clozapine 08/05/22. 11/10/22 Checked clozapine level on 400 mg HS= cloz 733+norclozapine 294 = total 1027 Reduced to 300 mg HS at the hospital. CBC every 2 weeks  Did he take duloxetine?  Might help depression more.  Consider retry pramipexole off label   But for now continue lorazepam 0.5 mg QID prn and lately BID-TID prn,  but use LED for cog reasons M administers  For [facility] had to stop rivastigmine DT worsening SI. Neuro [facility] Dr. Donia Pounds, [facility] in progress.   EEG on 06/03/23 bc recent sz   We discussed the short-term risks associated with benzodiazepines including sedation and increased fall risk among others.  Discussed long-term side effect risk including dependence, potential withdrawal symptoms, and the potential eventual dose-related risk of dementia.  But recent studies from 2020 dispute this association between benzodiazepines and dementia risk. Newer studies in 2020 do not support an association with dementia.  Discussed safety plan at length with patient.  Advised patient to contact office with any worsening signs and symptoms.  Instructed patient to go to the [facility] emergency room for evaluation if experiencing any acute safety concerns, to include suicidal intent.  He commits  to safety. His HI/SI that he complains about are intrusive ego-dystonic obsessions and not true HI SI in that there's no desire or intent ever.  Disc Ozempic and Monjauro for weight loss.  Has had weight gain from meds as a contributor. Lost 45# with Wegovy and now 150#.  Needs to keep up activity as much as possible for depression.  For severe constipation seeing GI Will not make further changes to address this. Had constipation before clozapine and GI appt in June started Amitza which helped some. Needs to push fluids.    Historically he has not done well with serotonergic medicines which would typically be used for obsessive thoughts.  But we are running out of options.  Has not tried with clozapine. BC blood level of clozapine reluctant to increase unless we have to so ...  Explained plan.  Disc SE and will take weeks.  Will use low dose bc some SE when trie dlast time.  Will reduce clozapine to reduce SE risk.  Needs 4 week trial before increasing.   Consider ECT for TR sx.  He's open to it but no decision today.  Disc memory concerns and mother's memory concerns bc had it before.  Disc counseling.  [email or web address] for therapist.  Most effective therapist will need to be expert in CBT  Dr. Quintella Reichert is not available for consultation.   Disc going to [facility] or [facility]  FU 4 weeks   Meredith Staggers, MD, DFAPA  Future Appointments  Date Time Provider Department Center  06/22/2023  1:00 PM Cottle, Steva Ready., MD [facility] None  07/20/2023  1:00 PM Cottle, Steva Ready., MD [facility] None  08/18/2023  1:00 PM Cottle, Steva Ready., MD [facility] None  09/19/2023  1:00 PM Cottle, Steva Ready., MD [facility] None  12/29/2023  1:30 PM Sondra Come, MD [facility] None    No orders of the defined types were placed in this encounter.      -------------------------------

## 2023-05-26 ENCOUNTER — Telehealth: Payer: Self-pay | Admitting: Psychiatry

## 2023-05-26 NOTE — Telephone Encounter (Signed)
Pt lvm that he is not taking respiridone at this time. He said Dr. Jennelle Human wanted to know and he was told to call and let him know. Please call him at [phone] if he needs to restart medication

## 2023-05-26 NOTE — Telephone Encounter (Signed)
Patient notified to not restart risperidone and he repeated back to me.

## 2023-05-26 NOTE — Telephone Encounter (Signed)
Patient was to call back about whether or not he was taking risperidone. He reports he is not currently taking it. He asks if he should restart it.

## 2023-05-26 NOTE — Telephone Encounter (Signed)
No.  As we discussed at the appt, we dont' want to add or change meds before his upcoming EEG.  Don't restart risperidone.

## 2023-05-30 ENCOUNTER — Other Ambulatory Visit: Payer: Self-pay | Admitting: Psychiatry

## 2023-05-30 DIAGNOSIS — Z79899 Other long term (current) drug therapy: Secondary | ICD-10-CM

## 2023-05-30 DIAGNOSIS — F314 Bipolar disorder, current episode depressed, severe, without psychotic features: Secondary | ICD-10-CM

## 2023-06-01 ENCOUNTER — Other Ambulatory Visit: Payer: Self-pay | Admitting: Psychiatry

## 2023-06-01 DIAGNOSIS — F314 Bipolar disorder, current episode depressed, severe, without psychotic features: Secondary | ICD-10-CM

## 2023-06-02 LAB — CBC WITH DIFFERENTIAL/PLATELET
Basophils Absolute: 0.1 10*3/uL (ref 0.0–0.2)
Basos: 2 %
EOS (ABSOLUTE): 0.3 10*3/uL (ref 0.0–0.4)
Eos: 8 %
Hematocrit: 38 % (ref 37.5–51.0)
Hemoglobin: 12.7 g/dL — ABNORMAL LOW (ref 13.0–17.7)
Immature Grans (Abs): 0 10*3/uL (ref 0.0–0.1)
Immature Granulocytes: 0 %
Lymphocytes Absolute: 2 10*3/uL (ref 0.7–3.1)
Lymphs: 50 %
MCH: 31.1 pg (ref 26.6–33.0)
MCHC: 33.4 g/dL (ref 31.5–35.7)
MCV: 93 fL (ref 79–97)
Monocytes Absolute: 0.2 10*3/uL (ref 0.1–0.9)
Monocytes: 6 %
Neutrophils Absolute: 1.4 10*3/uL (ref 1.4–7.0)
Neutrophils: 34 %
Platelets: 340 10*3/uL (ref 150–450)
RBC: 4.09 x10E6/uL — ABNORMAL LOW (ref 4.14–5.80)
RDW: 13 % (ref 11.6–15.4)
WBC: 4.1 10*3/uL (ref 3.4–10.8)

## 2023-06-15 ENCOUNTER — Other Ambulatory Visit: Payer: Self-pay | Admitting: Psychiatry

## 2023-06-16 LAB — CBC WITH DIFFERENTIAL/PLATELET
Basophils Absolute: 0.1 10*3/uL (ref 0.0–0.2)
Basos: 1 %
EOS (ABSOLUTE): 0.3 10*3/uL (ref 0.0–0.4)
Eos: 7 %
Hematocrit: 39.3 % (ref 37.5–51.0)
Hemoglobin: 12.6 g/dL — ABNORMAL LOW (ref 13.0–17.7)
Immature Grans (Abs): 0 10*3/uL (ref 0.0–0.1)
Immature Granulocytes: 0 %
Lymphocytes Absolute: 1.9 10*3/uL (ref 0.7–3.1)
Lymphs: 50 %
MCH: 30.6 pg (ref 26.6–33.0)
MCHC: 32.1 g/dL (ref 31.5–35.7)
MCV: 95 fL (ref 79–97)
Monocytes Absolute: 0.3 10*3/uL (ref 0.1–0.9)
Monocytes: 7 %
Neutrophils Absolute: 1.4 10*3/uL (ref 1.4–7.0)
Neutrophils: 35 %
Platelets: 456 10*3/uL — ABNORMAL HIGH (ref 150–450)
RBC: 4.12 x10E6/uL — ABNORMAL LOW (ref 4.14–5.80)
RDW: 13 % (ref 11.6–15.4)
WBC: 3.9 10*3/uL (ref 3.4–10.8)

## 2023-06-22 ENCOUNTER — Telehealth: Payer: Medicare PPO | Admitting: Psychiatry

## 2023-06-22 ENCOUNTER — Encounter: Payer: Self-pay | Admitting: Psychiatry

## 2023-06-22 DIAGNOSIS — K117 Disturbances of salivary secretion: Secondary | ICD-10-CM

## 2023-06-22 DIAGNOSIS — G3184 Mild cognitive impairment, so stated: Secondary | ICD-10-CM

## 2023-06-22 DIAGNOSIS — F5105 Insomnia due to other mental disorder: Secondary | ICD-10-CM | POA: Diagnosis not present

## 2023-06-22 DIAGNOSIS — E538 Deficiency of other specified B group vitamins: Secondary | ICD-10-CM

## 2023-06-22 DIAGNOSIS — K5903 Drug induced constipation: Secondary | ICD-10-CM

## 2023-06-22 DIAGNOSIS — F4001 Agoraphobia with panic disorder: Secondary | ICD-10-CM | POA: Diagnosis not present

## 2023-06-22 DIAGNOSIS — F411 Generalized anxiety disorder: Secondary | ICD-10-CM | POA: Diagnosis not present

## 2023-06-22 DIAGNOSIS — R569 Unspecified convulsions: Secondary | ICD-10-CM

## 2023-06-22 DIAGNOSIS — F314 Bipolar disorder, current episode depressed, severe, without psychotic features: Secondary | ICD-10-CM

## 2023-06-22 DIAGNOSIS — F428 Other obsessive-compulsive disorder: Secondary | ICD-10-CM

## 2023-06-22 DIAGNOSIS — Z79899 Other long term (current) drug therapy: Secondary | ICD-10-CM

## 2023-06-22 MED ORDER — ATROPINE SULFATE 1 % OP SOLN: 2.0000 [drp] | Freq: Three times a day (TID) | OPHTHALMIC | 1 refills | Status: DC | PRN

## 2023-06-22 MED ORDER — TRAZODONE HCL 50 MG PO TABS: 50.0000 mg | ORAL_TABLET | Freq: Every evening | ORAL | 0 refills | Status: DC | PRN

## 2023-06-22 NOTE — Progress Notes (Signed)
Albert [name] [id number] 08/20/1965 58 y.o.   Video Visit via My Chart  I connected with pt by video using My Chart and verified that I am speaking with the correct person using two identifiers.   I discussed the limitations, risks, security and privacy concerns of performing an evaluation and management service by My Chart  and the availability of in person appointments. I also discussed with the patient that there may be a patient responsible charge related to this service. The patient expressed understanding and agreed to proceed.  I discussed the assessment and treatment plan with the patient. The patient was provided an opportunity to ask questions and all were answered. The patient agreed with the plan and demonstrated an understanding of the instructions.   The patient was advised to call back or seek an in-person evaluation if the symptoms worsen or if the condition fails to improve as anticipated.  I provided 30 minutes of video time during this encounter.  The patient was located at home and the provider was located office. [id number]  Subjective:   Patient ID:  Albert [name] is a 58 y.o. (DOB [birth year]) male.  Chief Complaint:  Chief Complaint  Patient presents with   Follow-up   Depression   Anxiety   Stress    health   Sleeping Problem      Albert [name] presents to the office today for follow-up of mixed bipolar, anxiety and still grieving stress of breakup.  He requires frequent follow-up because of long-term symptoms.  At visit October 25, 2018.  We made several medicine changes because of his concerns about sleep and other issues.  We increased olanzapine back to 7.5 mg bc not sleeping as well at 5 mg.  He has to have the prescription for quetiapine written for up to 3 tablets at night in case insomnia is worse because insomnia makes his mood disorder so much worse.  We discussed that was above the usual max but the request was granted given his treatment resistant  status. We also reduce lithium from 900 mg daily to 750 mg daily to try to reduce tremor and muscle twitches.  At  visit March 28, 2019.  Fluoxetine was increased to 40 mg daily.   Early January 2021 increased to 60 mg daily.  No SE.  seen June 28, 2019.  No further meds were changed except olanzapine was increased back to 10 mg daily to see if if he could get additional benefit per his request.  Covid vaccinated.  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  Then had cholecystectomy.  April 2021 appt with the following noted: 2 episodes night sweats lately.  Memory has been very bad lately.  Repeatedly asks mother questions. Still  tend to stay in his room and his bed.  Still has rapid cycling mood swings.  Maybe some better with increase in olanzapine to 10 and tolerating itl.  Would like to get out more but can't DT Covid.  Can perform necessary chores.  Will get out of the house when he can.  No change in death thoughts and anxiety in intensity but is better with frequency.  Usually comes and goes in waves but more persistent.  Consistent with meds.  Ativan not helping anxiety very dramatically but he's not sure.  Fidgety.  No trigger other than still grieving relationship and can't get it out of his head.  Poor energy, concentration and more forgetful. In bed more and less active.  Sleep  ok lately which is unusual.  Can concentrate on financial matters and stock market at times.  Tolerating meds.  Still some intrusive SI without reason. Less frequent obsessive thoughts about broken relationship and has been doing this for months.  Can't let it go.  Loop.   No unusual stress even with the family who is supportive. Likes the benefit that Zyprexa gives. No sleepwalking nor falling nor odd behavior.  No history of sleep walking.  He understands this may recur with an increase.  Also wants option to rarely take extra quetiapine 300 for sleep prn. Plan:  Try to reduce lorazepam if possible.  10/22/2019  appointment with the following noted: Continues fluoxetine 60, lithium 600 mg, lorazepam 2 mg AM and HS and 1 mg midday, olanzapine 10, quetiapine 600 mg HS. Still anxious chronically and including driving in crowded spaces.  Doesn't thing Ativan helps as well as Xanax but less cognitive problems. Sleep is not as good.  Occ EFA.  More EMA and wanting to do things in the middle of the night but this is not typical.  Wonders about why that happens.  Some napping.   Tolerating meds.  Asks about weight loss meds.  Disc this in detail.   Plan no changes except OK meclizine prn vertigo.  02/11/20 appt with the following noted: Able to gradually reduce lorazepam to 1 mg AM and HS. More depressed over time and less interested in things and less interest in going out but does with his parents. Has reduced from 800 to 600 mg HS with some awakening but usually able to go back to sleep.[name]  Spending a good amount of time in bed bc watches TV in bedroom.  Parents watch TV in different part of the house.  No mood swings he notices.   Concerns about weight gain about 200#. Likes olanzapine's benefit for sleep. Plan: Trial for TRD to  Increase fluoxetine to 80 mg daily  to use the combo with olanzapine for TR bipolar depression.   04/21/20 appt with following noted: I thought in beginning some benefit with fluoxetine.  Lifelong negative thinking continues.   Not much bipolar.  Reads a lot on bipolar.   Still tired and anxious a little more may be seasonal.  No familial stressors.  Tends to lay down in afternoon.  Anxiety not over anything in particular.   No SE with fluoxetine. Took meclizine prn.  Easily motion sick.    Asks questions about newer drugs for bipolar like Caplyta. Plan:  No med changes  06/30/20 appt noted: Tired of wearing masks.  Vaccinated.  Asked questions about when this will end with mask mandate.  Mood up and down some since here and getting up 2-3 times.  Disc awakening problems.   Trying to do better bc night eating some.   Mood is about average today so far.  Albert [name] out with friends for breakfast.  Recognizes activity helps mood.   3 days in a row napped a lot in the afternoon.   Bed is a comfort place.  Gets bored and hard to motivate.  Tries to stay away from night eating and spending.   More depressed when alone and wonders about med changes. Plan: Failed response to olanzapine + fluoxetine for TR bipolar depression.   Reduce fluoxetine to 1 daily and reduce olanzapine to one half nightly for 10 days and then stop it. Then start Caplyta 1 daily  08/27/2020 appointment with the following noted: Patient decompensated with the  above switch to Caplyta with intrusive suicidal thoughts and had to be hospitalized for psychiatric reasons.  Multiple phone calls with family members since that time.  Spoke with the psychiatric nurse practitioner with the decision to restart olanzapine in place of the Caplyta given the patient was more stable while on the combination of Seroquel and olanzapine than with the Caplyta. Caplyta triggered HI/SI and depressed and confused on it, and agitated and still has some of it now. No akathisia. Frustrated with lack of therapy at the hospital.   Hydroxyzine didn't help jitteriness.  Feels some better.  Fleeting SI & HI but not obsessive like it was prior to hospitalization.  Feels a little amped up and nervous.  Scared of what could happen.  Apprehensive being here talking about things. 5-6 hours sleep last night and would like to have more. At mom and dad's house right now and up more in the day. Inadvertently stopped lorazepam abruptly by mistake likely contributing to shakiness. Still some memory issues.  Trying to stay out of bed when watching TV.  Some awakening but managing. Occ fleeting SI and distracts himself.   Always spends a lot of time just laying around.   Can have anxiety for no reason like coming here, and varies in intensity without  pattern.      Less panic than in the past.  Some chronic depression and hyperactivity and loudness and hyperverbal.  Usually back to sleep.  Total 6 hours but some napping, awakens 2-4 times nightly but back to sleep..  Taking quetiapine 600 mg HS.  still lacks interest and motivation.  Can follow a TV show if interested. Plan: Restart lorazepam 1 mg in the morning, 1 mg in the afternoon and 1 mg at night for anxiety Stop hydroxyzine  Increase olanzapine to 1 and 1/2 of 10 mg tablets in evening  09/05/2020 appt noted: seen with parents today at his request Made med changes noted and tolerated the changes Better than I did last week. Now only fleeting HI/SI and "no where near what it was".  Sleeping better.  Still not a lot of energy.  Anhedonia.  Less agitation.  A lot of anxiety all the time but it's helping.  Would like more energy and motivation.   Doesn't handle stress well. Parents note he's less shakey.  He's still staying with his parents.  Only driven once since this happened and F wants him to be able to drive and function more independently.  M agrees he's better.  Memory is better but not normal.  Primarily STM problems No akathisia with olanzapine this time.    Administering his own meds but mo watched. Slept 8 hours last night. Plan consider further reductions in quetiapine  10/17/20 appt noted: Stayed on Seroquel 600 HS and olanzapine 15. Olanzapine seemed to do most for the sleep.  Also helping eliminated HI/SI for the most part.  Wants to try to increase it.  Depression is much better with less rumination also.  Fears akathisia at higher dose as in the past. Also on fluoxetine 40.   Plan: Reduce Seroquel to 1 and 1/2 tablets at night Increase olanzapine to 1 of the 15 mg tablet and 1/2 of the 5 mg tablet for 1 week,  Then, if tolerated increase the olanzapine to 20 mg or 1 of the 15 mg tablets and 1 of the 5 mg tablets. If possible then also reduce quetiapine to 1 tablet at  night.  11/05/2020 appointment with the  following noted: Up to olanzapine 20 mg hs for 3 days.  Reduced Serooquel to 450 mg HS.  Didn't try to go lower. Had hiatal hernia and umbilical hernia surgery recently with no problem.   No akathisia so far. Sleep ok with meds so far changes.  No mood changes yet but overall likes the smoothness of olanzapine and helping sleep without knociing him out. Still some occ HI/SI, he thinks bc of anxiety generally.  Easily anxious. Plan: Continue olanzapine 20 mg nightly longer to give it time to help mood sx reduce quetiapine to 1 tablet at night to minimize polypharmacy and reduce risk of akathisia.  01/07/21 appt noted: Able to reduce quetiapine 300 mg HS ok with decent sleep but still wakes a lot.  No AM hangover.  Don't have a lot to do.  Can enjoy going out and doing things.  But doesn't do it longterm.  Family is doing good.  Father started year 70 at his job.   Tolerating the meds well.  May wake more with quetiapine reduction. Lost 10#. Wonders if could try other meds he didn't tolerate before bc now can tolerate olanzapine and couldn't tolerate it in the past DT akathisia. Mood never great but can swing into depresssion without reason.   Plan: Continue olanzapine 20 mg nightly longer to give it time to help mood sx reduce quetiapine to 1/2 of the 300 mg  tablet at night to minimize polypharmacy and reduce risk of akathisia.  02/02/2021 appointment with the following noted: Tried reduced quetiapine to 150 mg and after a week had withdrawal sx including jittery and SI and he increased to 250 mg daily.  Felt better after increasing to 300 mg daily and last week reduced to 250 mg daily.  After increase quetiapine felt better in a few days.  Seems more alert with less quetiapine but can't tolerate a quick withdrawal.   Some awakening.  Plan: Reduce quetiapine by 50 mg every 2-4 weeks.  03/09/2021 phone call: Pt called and said that he is weaning off the  seroquel. He was down to 200 mg of the seoquel and he started having withdrawal symptoms and sucidal thoughts. He went back up to 250 mg because he knew he was good on that.    Pt stated he starting back taking 250 mg on Saturday due to the symptoms he was having.Now he is no longer having suicidal thoughts but still very jittery. Albert [name]: He may just need to taper slower than most people.  If he can tolerate the 250 mg Seroquel with the jitteriness it should get better over the next 1 to 2 weeks.  Then he can try going down again by 50 mg at a time.  He probably needs to go down by just 50 mg every 2 to 4 weeks and wait till he fully adjusts to each reduction before he reduces again.  03/17/21 appt noted: Hernia surgery 2 weeks ago. Seroquel 250 mg for a month then 200 mg daily and had SI and increased again to 300 mg daily.  Would love to get off it.  Withdrawal triggers intrusive HI/SI but otherwise doesn't have them No SE. Sleep is OK but not enough REM sleep.  Satisfied with other meds.   No current HI/SI.  No paranoia.  Still has anxiety and taking Ativan every 6 hours.  It works but doesn't cure it. Chronic anxiety and ask about ketamine for chronic depression. Plan: Because he had WD reducing from 250 to  200 mg daily will have to go slowly. Reduce quetiapine by 25 mg every 2-4 weeks. Reduce Seroquel to 250 mg for 2 weeks, Then reduce to 225 mg for 2 weeks, Then reduce to 200 mg for 2 weeks, Then reduce to 175 mg for 2 weeks, Then reduce to 150 mg Also: Buspirone 30 mg tablets for anxiety Start 1/3 tablet twice daily for 1 week Then increase to two thirds twice daily for 1 week Then increase to 1 tablet twice daily  04/15/2021 appointment with the following noted: Reduced Seroquel to 200 mg HS and increased buspirone to 30 mg BID STM px and wants to try to reduce Ativan to see if it is better.  But fears with drawal sx. Parents complain about his memory. Less HI and SI since  here. Less depressed. Plan: Buspirone 30 mg tablets for anxiety Start 1/3 tablet twice daily for 1 week Then increase to two thirds twice daily for 1 week Then increase to 1 tablet twice daily Continue olanzapine 20 mg nightly longer to give it time to help mood sx Because he had WD reducing from 250 to 200 mg daily will have to go slowly. Reduce quetiapine by 25 mg every 2-4 weeks. Reduce Seroquel to 250 mg for 2 weeks, Then reduce to 225 mg for 2 weeks, Then reduce to 200 mg for 2 weeks, Then reduce to 175 mg for 2 weeks, Then reduce to 150 mg Trial taper lorazepam: In hopes of improving short-term memory  04/30/2022 phone call: Complaining of withdrawal symptoms reducing lorazepam from 0.5 mg 5 times a day to 0.5 mg 4 times daily.  05/21/2021 appointment with the following noted: Gotten onto buspar 30 BID and down on lorazepam to 0.5 mg QID. Gotten down to Seroquel 200 mg HS A little restless every now and then but able to reduce lorazepam for the last weeks. Is too inactive and lays in bed too much watching TV.   Can' t tell effect of buspirone bc of other changes.  Sleep with awakening with 6-7 hours. Fleeting SI without trigger or plan or intent. Depressed more than anxious. Enjoys watching stocks and investing. Plan: Trial taper lorazepam gradually over time In hopes of improving short-term memory But for now continue lorazepam 0.5 mg QID Start clonidine 0.1 mg tablets one half at night for 3 nights, then 1/2 tablet twice daily for 3 nights, then one half in the morning and 1 tablet at night If clonidine does not noticeably help anxiety call the office  05/27/2021 phone call initially complaining of homicidal and suicidal thoughts after taking 1 mg of clonidine.  He was told to stop the medication.  But then called back stating he was having homicidal and suicidal thoughts from withdrawal from the medication which was not logical.  He agreed it was not logical and stated on  05/29/2021 that he felt better  06/23/2021 appointment the following noted: No clonidine. Sunday panic attack eating out and then having SI.  Stayed with father to feel safer. Intrusive thoughts of hurting parents or others.  HI only when has SI.  Usually panic without SI.  Then had intrusive thoughts about hurting others.   Usually intrusive thoughts are brief and fleeting.    07/20/21  appt noted; More anxious with less lithium.  More anxious in his body.  Not more depressed or irritable.  Sleep is the same.  More racing thoughts.  Chronic SI probably a litte worse but not unmanageable. Rduced lithium last week  to 300 mg daily. No recent change in lisinopril. Off the clonidine for a week or so. Doesn't drink much water.  Drinks a lot of diet coke. No marked akathisia or RLS but does shake his leg DT anxiety. Worries about needing mor emedicine. Plan: Increase olanzapine to 25 mg nightly bc more racing thoughts and anxiety with less lithium DT severity of sx and difficulty getting off Seroquel conside rincrease.  07/30/21 TC : CO racing SI today and wonders about whether increased sx racing thoughts, hyperactivity, SI related to Reduced lithium from 600 mg daily to 300 mg daily recently DT lithium level 1.8.  could be related.    Plan: increase olanzapine 30 mg pm Today take lithium 600 mg now.  Tomorrow start lithium CR 450 mg daily Continue olanzapine 30 mg PM until SI resolves unless akathisia returns.  Albert Staggers, Albert [name], Albert [name]  08/17/21 appt noted:  No akathisia with olanzapine 30 mg pm (about 3 weeks) and is sleeping better. Increased lithium to 450 mg daily. Tolerating meds. Still racing negative thoughts maybe some better.  Asked about it. SI come and go.   Anxiety about the same as depression.  No motivation unless has to do something. Plan: Reduce  fluoxetine 20 mg 1 daily in hopes racing thoughts and anxiety are better.  09/14/2021 appointment with the following noted: No  difference noted with reduction fluoxetine 20 mg daily. Most of the time negative thoughts, ex "what if I get in a wreck?"  Frequent negative thoughts.   Still on lorazepam 0.5 mg TID.   Wonders about trying prior meds that caused akathisia bc no longer gets it with olanzapine 30 mg daily. Sleep is usually ok.  Taking Seroquel 200 mg HS.  Thinks the olanzapine helps his sleep and doesn't want to stop it. Panic 1-2 times per month.  Then takes extra lorazepam. Plan: DC fluoxetine 20 mg 1 daily  Trial Auvelity off label for depression 1 in the AM for 1 week then 1 in AM and 1 with evening meal  10/15/2021 appointment with the following noted: Occ of getting confused as to day and went to church on a Thursday my mistake.   Had it happen a few months ago too.  Memory is terrible. Thinks he couldn't tolerate more Auvelity. Not a lot of benefit from [place] 1 daily. Still quite a bit of anxiety.   A little drowsy  and sleeps 7-8 hours at night. Word finding problems.  Lost wallet. Plan: DC  Auvelity  For [facility]: memantine 1/2 tablet in the AM for 1 week, then 1/2 tablet twice daily, then 1/2 tablet in the AM and 1 tablet at night for 1 week then 1 tablet twice daily  10/22/2021 phone call complaining of additional stress asking to start new medication because of anxiety and more suicidal thoughts without intent or plan.  Reports taking more than 4 prescribed Ativan a day recently.  Asking to increase olanzapine which is already at 30 mg daily. Albert [name] response:We cannot go any higher in the dosage of olanzapine that he is currently taking.  A lot of these suicidal thoughts are apparently anxiety driven.  Let us have him resume taking paroxetine which is one of the more effective medicines for anxiety.  I will send it in and the instructions will be paroxetine 20 mg tablets 1/2 tablet daily for 1 week then 1 tablet daily  11/16/21 appt noted: Multiple phone calls since she was here.  Complaining of suicidal  thoughts  from memantine which was stopped. Started paroxetine and up to 20 mg daily.  Continues olanzapine 30 mg, lithium 450 mg nightly, lorazepam 0.5 mg 4 times daily , And quetiapine 200 mg nightly. Questions about whether memantine caused the neg SI really bc has them at times. When has SI often anxiety driven.  Will tend to ruminate on past relationship ended.   Edgy and irritable all the time but memantine seemed to make it worse. Mind is getting better with less SI lately.   No SE with paroxetine.   Aggrivated by forgetfulness.  eXample, talked with someone about going to breakfast last night and then forgot to go this mornin.g. Plan prescribed donepezil 5 mg daily for cognitive complaints specifically forgetfulness  12/31/2021 appointment with the following noted: He had been prescribed donepezil 5 mg for cognitive complaints.  He called back reporting he felt it was causing homicidal thoughts and was instructed to stop it.  He has had these types of thoughts in the past.  He had no desire to act on them. Discouraged. Ongoing depression and reduced enjoyment and negative thoughts.  Can enjoy some things. A lot of anxiety chronically.  Trying to limit Ativan to 5 of 0.5 mg daily.  Sometimes needs 2. Constipation for years.  02/03/22 appt noted: Depression makes LBP worse. Ongoing chronic depression. Tolerated rivastigmine without triggering SI Chronic anxiety and Ativan helps some. Occ panic but not usual.  Asks if any other meds coud be used. Plan: But for now continue lorazepam 0.5 mg QID, but use LED for cog reasons Increase rivastigmine to 3 mg capsule 1 twice daily or 2 of the 1.5 mg capsules twice daily.  Call if you have problems with nausea Start dextromethorphan 1 capsule twice daily . Thi sis off label for depression based on MOA of Auvelity and use of paroxetine to prolong half life of DM. Presence of paroxetine may have been reason he didn't tolerate Auvelity brief trial  before.  Will proceed slowly.  03/17/2022 appointment noted: Current psych meds: Rivastigmine 3 mg twice daily, quetiapine 200 mg nightly, paroxetine 40 mg daily, olanzapine 30 mg nightly, Namenda 10 mg daily, lorazepam 0.5 mg 4 times daily, lithium CR 450 mg nightly, dextromethorphan 15 mg twice daily A lot of anxiety since here.  Some days taken more lorazepam to 6-7 tablets daily.  No major triggers but anxious even about coming here and other normal things.  Cancelled trip to collect rocks with friend and cancelled the trip bc anxious.  Usually if can go he is ok.  Lots of SI ongoing Memory might be a little better. Asks how he responded to [employer].  Will check paper chart.  03/24/22 TC:  CO SI worse and restilless with Rivastigmine 9 mg . Albert [name] response:  Stop the rivastigmine since the [place] appears to be having side effects from it.  He was having anxiety at the last appointment 2 and then we increased the dose.  This might be causing the anxiety to be worse. He has chronic suicidal thoughts without intent or plan.  However it appears the thoughts are more intense with the rivastigmine     05/06/21 appt noted: Stopped rivastigmine DT anxiety and worsening SI.  Was better the next day. This week had some SI.  Thinking about the prior relationship tends to lead to SI. Will take Ativan when gets SI and it helps a little at 0.5 mg at a time. Plan: Consider fluvoxamine.  Yes this may help with  obsessions on old GF and he failed other optioons. Start fluvoxamine 1/2 of 50 mg tablet and reduce paroxetine to 1 and 1/2 of 20 mg tablets for 5 days, Then increase fluvoxamine to 1 tablet daily and reduce paroxetine to 1 tablet daily for 5 days, Then increase fluvoxamine to 1-1/2 tablets daily and reduce paroxetine to 1/2 tablet daily for 5 days, Then increase fluvoxamine to 2 tablets daily and stop paroxetine   06/07/22 appt noted: Feels like the fluvoxamine 50 BID is too much.  On this a couple of  weeks. Worries he might be worse with this with regard to SI.    He thinks it's worse with increase.   New neighbor who has a young boy. He worries that Reginal Lutes is triggering depression and intrusive SI/HI a couple of days later and lasts a little while. Has had anxiety turning into panic attacks.  This gets associated with depression and SI/HI.  These thoughts are intrusive and he doesn't want to act on them.  Can have HI even towards family with whom he has no negative emotions.  Little things like a bump up in parking lot sets off his depression and anxiety and doesn't seem to handle things.   No sig akathisia.  In the past it's been continuous.   Awakens every 2-3 hours.  Usually goes back to sleep with meds. Has not had alcohol in 20 years and had some intrusive thoughts without drinking.   Plan: As soon as he feels comfortable increase fluvoxamine to 50 mg AM and 100 mg PM for intrusive thoughts of HI/SI and obs on GF  07/06/22 appt noted: Increased fluvoxamine to 150 mg daily and felt more agitated and some SI so reduced to 100 mg 4 days ago and feels better so far.  Likes the fact it is good for anxiety and obs thoughts.  Wonders if there is a similar med.  Frustrated he can't get relief.  Some improvement in obs on exGF but not resolved. Continue other meds: lithium CR 450 mg HS, lorazepam 0.5 mg QID, Memantine 10 BID, olanzapine 30 mg HS, quetiapine 200 mg HS Tingling and burning in legs at night but doesn't interfere with sleep. Got insurance to pay for [facility] and lost 13#.   No full panic lately. But some anxiety waves.   Still major problems with memory. Plan: Wean fluvoxamine 50 mg daily for a month then 1/2 daily for 2 weeks then stop it DT  NR.  Watch for more anxiety.  07/28/22 TC: Albert [name], Albert [name]  to Me     07/28/22  4:16 PM Note Mom called reporting patient has been confused, dizzy, and sluggish for the last 3 days. He is now staying with her. She had several questions in  regards to this:   Lithium level - she feels he needs one done, last result in Epic was 08/2021 that I see. She would like order sent to [facility] for tomorrow.   Patient is on Wegovy, has been for about a month, and she wonders if this could cause sx.   Patient has meclizine and she is questioning if she should give that for his dizziness considering other sx.   She questions if perhaps he got up during the night and took additional medications, though doesn't know if this happened and if so what he took.    On 3/5 visit he was to wean fluvoxamine to 50 mg qd for a month and then 1/2 tab for  2 weeks and then stop. Mom and pt unsure if he started the wean.    He has F/U 4/4.        Me  to Albert [name], Albert [name]   CC   07/28/22  5:44 PM Note Yes get lithium level in the AM and don't take any lithium tonight or in the morning before the blood test.     Reginal Lutes should not cause these sx except if he is losing wt quickly or is dehydrated it can cause lithium toxicity and include these sx.  He needs to drink lots of water.   He should go ahead and wean the fluvoxamine as instructed in the last appt.   If the sx get worse, go to the ER.    Me   CC   07/29/22  6:49 PM Note RTC   Sx not as severe as when he had to go to hospital in the past.  Albert [name] got Lithium level today.   Speech is not good .  Confused at times, but better today.  A little tremor .  A little balance problems.  No falls.  No diarrhea Took lihtium 450 last night. Mo says he got up in the middle of the night and moved things around last night and he doesn't rmember.   No change in night time meds.   Gradually reducing fluvoxamine.   No extra lorazepam.     Spoke with mother who verifies the same.   No med changes but push fluids.   Albert Staggers, Albert [name], Albert [name]      08/05/22 appt : with parents Pt hosp 08/02/22 with acute kidney injury and somewhat elevated lithium level with some confusion.  Spoke with psych NP at hosp  and lithium was decreased to 450 mg HS. No pending appt known with medical doctors about kidney function. Don't feel good and having HI and SI and feels he needs lithium for these issues.  Doesn't feel he should have been discharged.   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 No added meds at night but has been confused at night and getting up multiple times and doing things he doesn't remember.  This concerns parents.  Parents been up at night with him and he doesn't make sense.  Still groggy in the am per mo for an hour.   Plan: Plan: Get lithium level and kidney test ASAP Stop fluvoxamine Reduce lithium 300 mg capsule 1 at night Monday night: reduce to olanzapine to 20 mg at night (stop the 10 mg tablet) and start clozapine prescription. Monday 4/15 : increase clozapine to 100 mg at night and reduce quetiapine to 1/2 of 200 mg tablet and continue olanzapine 20 mg each evening.  08/01/22 & 08/11/22 ED for HI and SI  08/12/22 TC mother:  Mother, Albert [name] called. Per note made by Albert [name]. He was referred to [facility].Reporting that he did go up there last night. This morning they called her and told her to come pick him up. They told her he wasn't having homicidal or suicidal thoughts and it would be four to five days of him waiting to go to a facility in [place]. They recommended him for PHP to start 4/16. She picked him up today. He is worried about these thoughts coming back up. Also, [facility] came out yesterday to assess him and offered counseling from [facility]. He wants to do counseling with them.  Any input on care he can receive?  He is so discouraged and wants  to be safe.  (She notes that the [facility] gave him- Zyprexa 10mg  at 1:07am and again at 1:25am per the med list- based on his previous med list) He was supposed to be off of Zyprexa10mg . Has been on  Clozapine since Monday. He did go ahead and take it before he left ( 2pills) so he hasn't missed a dose and did go get labwork done. Is wanting to continue the  Clozapine, please check the labwork to be able to fill the 100mg  dose.    Albert [name] resp:  Continue all meds as RX except go ahead and increase clozapine to 100 mg tablets, 1 each night as soon as he can get the 100 mg tablets from pharmacy.  RX sent.  Trying to speed up his chance of response by getting it to a hgiher dose.  Clozapine is very effective for HI/SI.  If he gets worsening HI/SI in the interim it will not be caused by meds but just the natural waxing and waining of his sx.  Once clozapine dose gets high enough it should help but avg patient needs 300 mg so we have to just be patient.      08/20/22 appt:  Up to 100 mg clozapine HS SE constipation and using Colace, Miralax.  Been to [facility] twice  since here.   Ongoing continuous HI/SI.   All I want to do is go to sleep to escape.  Dep causing the thoughts.   Taking Ativan 0.5 mg BID-QID.  He thinks mother not giving him enough of it.  He thinks anxiety triggers HI/SI.  He fears HI and hopes he's strong enough to reisist.  Not angry or wanting to hurt anyone in reality but has the intrusive thoughts. Conc and memory still not good.   Sleep not much different.   Staying with parents currently. Dep gotten worse M worrying he's dependent on lorazepam.   Confusion at night is no longer happening.  Not wandering at night.  Plan: For severe constipation start Linzess 145 mg AM Get lithium level and kidney test ASAP Stopped fluvoxamine and confusion at night is gone  lithium 300 mg capsule 1 at night  olanzapine to 20 mg at night  increase clozapine to 150 mg at night for 3 nights then 200 mg HS   olanzapine 20 mg each evening. Continue lorazepam 0.5 mg QID, needs this and informed mother  09/07/22 appt noted: Psych meds: clozapine 250 MG HS, lithium 300 mg HS, lorazepam 0.5 mg BID-QID, olanzapine 20 .  Off Seroquel, linzess 290 mg daily, Miralax, Colace. Sleep 7-8 hours without change from before clozapine.   Mood is still very depressed and  still gets anxiety. Still problems with constipation.   Dep is so much it is causing intrusive HI/SI.   No more bizarre behaviors at night.   Plan: Plan: Get lithium level and kidney test ASAP  lithium 300 mg capsule 1 at night  olanzapine to 20 mg at night  increase clozapine to  300 mg HS as soon as tolerated and then 400 mg HS.  olanzapine 20 mg each evening. Continue lorazepam 0.5 mg QID, needs this and informed mother  10/07/22 appt noted: alone and with parents Rough month.  Altered voice for a couple of weeks or so..  Mind not good.  Feel out of sorts.  Jittery.  Constipation.  No appetite.  On Wegovy.  Saw GI doc.   May need to reduce Wegovy bc poor appetite and constipation.  Lost  25 #. Current psych med: clozapine 400 mg HS about a week or 2, olanzapine 20 mg HS, lithium 300 mg HS, lorazepam 0.5 mg QID. Reduced intrusive thoughts.  2 nights since here night eating.   Sleep 6 hours.  Limited napping during the day.   Anhedonia.  Lower motivation.  Tremor worse. M says he sleeps pretty well but awakens  BP is under control. Plan Reduce lithium to 150 mg capsule 1 at night DT level higher Reduce  olanzapine to 10 mg at night  Continue clozapine 400 mg HS bc helping SI and HI and  Continue lorazepam 0.5 mg QID, needs this and informed mother  11/08/22 appt noted:  alone and with parents. Ongoing calls between visits Doesn't think clozapine helping him sleep as well as olanzapine for sleep or any other benefit. While at restaurant had intrusive HI/SI. Staying with parents seems to provoke intrusive HI.  Is grateful to them.  Sat woke up with them.  Sometimes goes away and sometimes not.  No know trigger and no intent. Saw PCP.  Off Wegovy.  Started metformin. No effect noted from clozapine.   M limits his lorazepam.  PCP referring him to neuro to FU abnormal head scan.  He wonders if his speech and memory problems are related.   Plan: Cont clozapine 400 mg HS Check clozapine  level and then direct dosing.  11/16/22 TC from pt to disc level:  Albert [name] resp:  Clozapine level ok but may benefit with increase to 500 mg nightly.  Tell him to increase with current supply.     12/06/22 TC from pt:  complaining of worsening intrusive ego-dystonic HI/SI without intent, desire or plan.   Albert [name] resp ok to increase clozapine to 600 mg HS.  12/09/22 appt noted: Complaining of ongoing racing thoughts and worsening HI/SI without intent or desire or plan.  Racing thoughts chronic and other thoughts wax and wane.  Usually triggered if starts thinking of something negative Meds: clozapine 600 mg HS, lithium 150, lorazepam 0.5 mg QID, olanzapine 10 HS Sleep with EFA.   Will have trouble going to sleep sometimes with thoughts. Doesn't think clozapine makes him sleepy.  Not really drowsy daytime. No dx OSA and no known apnea.  SE a little dizzy.  Constipation managed with BM every 2-3 days. Better than before.  PCP RX metformin and it helped. Plan: Plan for intrusive HI/SI wean olanzapine and trial of risperidone.  Reduce olanzapine to 5 mg HS for 1 week then stop it.  Addressed his fears of not sleeping off olanzapine. Then if sleeping start risperidone.  01/11/23 appt noted:  also seen with parents Psych meds: clozapine 600, lorazepam 0.5 mg QID, No olanzpaine ,  lithium 150 mg daily, no risperidone yet. Lost 45# on Wegovy down to 150#.   Still having intrusive HI/SI as noted before. Sleep no worse.  Takes a couple of hours after clozapine to sleep.  EFA 2-3 times nocturia.   Still racing thoughts.  Ongoing px with conc, memory,  planning Scored 73 on 100 pt test for cognition.   Some trouble with articulation and speech at times.   "If could get rid of racing and intrusive thoughts I'd be 100% better." Neuro says no stroke history and low B12 and workup ongoing at [facility], Albert [name]. Memory is still terrible.   Plan: Start risperidone 2 mg tablet, 1 at night for racing thoughts and  intrusive homicidal and suicidal thoughts. Call in 3 weeks if  not better and we will increase the dose  02/09/23 appt: seen also with parents , 60 min  Lost to 152#. Meds: as above, risperidone incr to 4.5 mg HS for intrusive HI/SI SE drooling off an on.  Tremor mainly worse with a little more risperidone HI/SI intrusive thoughts wax and wane.  Worse when lays down. Takes 2 hours to fall asleep.  Needs to get OOB when awakens.  6 hours sleep. No falls.   Would rather try to increase risperidone than other options.    04/12/23 appt virtual:  seen alone CC:  Multiple phone calls about ongoing intrusive, obsessive, ego dystonic HI/SI thoughts without plan or intent. Didn't come to the office bc of these thoughts.  I know I don't want to do it.  Can last four hours.  Sees drug advertisements that talk about SI and that will trigger obsessions about it. Meds: clozapine 600 mg HS,  lithium 150 mg HS, lorazepam 0.5 mg TID, risperidone increased to 8 mg and now back to 4mg  HS.   Neuro sched MRI tomorrow. Enough sleep 5-6 hours at night.  Not napping.   Sometimes gets confused with med but not ususally.   No recent mania.   Depressed and anxious. Plan: AVS: Reduce risperidone to 1/2 of 4 mg tablet in evening for 1 week then stop it. Reduce clozapine to 5 tablets nightly Start fluvoxamine 100 mg tablet 1/2 tablet nightly for 1 week then 1 tablet nightly for obsessive, intrusive thoughts  05/25/23 appt in person:  not with parents, met with [facility] with 2 sz and then had psych hosp since here.  Another EEG 06/03/23.  Neuro DR.  Clelia Croft.  RX Keppra.  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 Psych med: clozapine reduced to 300 HS, stopped ? Risperidone he's not sure, clonazepam 0.5 mg BID, lithium 150 HS, trazodone 50 prn rare.  Keppra 500 BID. Trazodone good sleep med. SE drooling again.   Not sure any mental health effect of Keppra.   Denies any street drugs despite  positive UDS for ecstasy. Overall still having intrusive HI/SI without intent or plan chronically but maybe is a bit better than before hosp stay. Sleep is better.  Lost 40# on Wegovy.  Plan no change  06/22/23 appt noted: EEG shows some epileptiform activity with FU pending with another 4 hour EEG pending. Meds unchanged Overall mental health about the same or a tad bit better.  Seems he can distract himself from intrusive thoughts and they are less common than they were.   CC mouth watering from med.  Choking middle of the night.  Atropine helps some but has to be repeated and "pain in the ass".   Sleep 6 hours.  Sometimes taking trazodone 50.  Sleep is not that bad.  Psych med: clozapine reduced to 300 HS, , clonazepam 0.5 mg BID, lithium 150 HS, trazodone 50 prn rare.  Keppra 500 BID.   Decided more even anxiety benefit with clonazepam vs lorazepam.  Anxiety less on clonazepam.  Psych med hx extensive including ECT and  Risperidone Maxed out risperidone 8 mg without benefit for intrusive thoughts  Zyprexa 30 Latuda 80 which caused akathisia,  Vraylar, Rexulti, aripiprazole 20 mg with akathisia,  Seroquel 1000 mg,  InVega, Geodon, Saphris with side effects, symbyax, Fanapt NR .  Caplyta SE and markedly worse. Clozapine started end of March lithium 1200 SE,  lamotrigine 300 mg, Depakote 2000 mg, Tegretol, Trileptal and several of  these in combinations, gabapentin,  N-acetylcysteine, Nuedexta,     Belsomra with no response,  Lunesta no response, trazodone 200 mg,  Xanax, clonazepam, lorazepam less sedation. Buspirone NR Clonidine SE with 2 trials  Viibryd 40 mg for 3 months with diarrhea,  protriptyline with side effects,  Trintellix 20 mg,  Parnate 50 mg with no response,   Emsam 12 mg for 2 months,   imipramine,  venlafaxine,   bupropion was side effects,  Lexapro 20 mg, sertraline, paroxetine, Deplin, fluoxetine 80,  fluvoxamine 150 agitated; retrial ? Sz related.    Auvelity NR at one daily.  SE BID  methylphenidate 60 mg,  Vyvanse, Concerta, strattera, , modafinil,  Memantine worsening SI? Donepezil, complained of HI, Exelon 3 BID rivastigmine DT worsening SI  pramipexole,  amantadine ,  Patient prone to akathisia.  [facility]  HX ECT   Review of Systems:  Review of Systems  Constitutional:  Positive for appetite change and fatigue.  Cardiovascular:  Negative for chest pain and palpitations.  Gastrointestinal:  Positive for constipation.  Musculoskeletal:  Positive for back pain.  Neurological:  Positive for dizziness and tremors. Negative for weakness.       Fidgety  Psychiatric/Behavioral:  Positive for decreased concentration, dysphoric mood and suicidal ideas. Negative for agitation, behavioral problems, hallucinations, self-injury and sleep disturbance. The patient is nervous/anxious. The patient is not hyperactive.     Medications: I have reviewed the patient's current medications.  Current Outpatient Medications  Medication Sig Dispense Refill   acetaminophen (TYLENOL) 500 MG tablet Take 1,000 mg by mouth every 6 (six) hours as needed for moderate pain.     atropine 1 % ophthalmic solution Place 2 drops under the tongue 3 (three) times daily as needed. 2 mL 1   cholecalciferol (VITAMIN D3) 25 MCG (1000 UNIT) tablet Take 1,000 Units by mouth daily.     clonazePAM (KLONOPIN) 0.5 MG tablet Take 1 tablet (0.5 mg total) by mouth 2 (two) times daily. 60 tablet 1   cloZAPine (CLOZARIL) 100 MG tablet TAKE 3 TABLETS (300 MG TOTAL) BY MOUTH AT BEDTIME 42 tablet 2   Cyanocobalamin (VITAMIN B-12 IJ) Inject as directed every 30 (thirty) days.     esomeprazole (NEXIUM) 40 MG capsule Take 40 mg by mouth daily.     fenofibrate (TRICOR) 145 MG tablet Take 145 mg by mouth daily.     levETIRAcetam (KEPPRA) 500 MG tablet Take 1 tablet (500 mg total) by mouth 2 (two) times daily. 60 tablet 0   levothyroxine (SYNTHROID, LEVOTHROID) 75  MCG tablet Take 75 mcg by mouth daily before breakfast.     lithium carbonate 150 MG capsule Take 1 capsule (150 mg total) by mouth at bedtime. 90 capsule 0   lubiprostone (AMITIZA) 24 MCG capsule Take 24 mcg by mouth 2 (two) times daily with a meal.     metFORMIN (GLUCOPHAGE-XR) 500 MG 24 hr tablet Take 500 mg by mouth 2 (two) times daily with a meal.     tamsulosin (FLOMAX) 0.4 MG CAPS capsule Take 1 capsule (0.4 mg total) by mouth daily. 90 capsule 3   traZODone (DESYREL) 50 MG tablet Take 1 tablet (50 mg total) by mouth at bedtime as needed for sleep. 30 tablet 0   No current facility-administered medications for this visit.   Medication Side Effects: Other: mild sleepiness.   Occ twitches.\, tremor  Allergies:  Allergies  Allergen Reactions   Meloxicam Other (See Comments)    Dizziness  Zyprexa [Olanzapine] Other (See Comments)    Akathesia   Nsaids Other (See Comments)    Hallucinations   Ibuprofen Other (See Comments)    Can not take because taking lithium   Prednisone Other (See Comments)    Can't sleep    Wellbutrin [Bupropion] Other (See Comments)    Suicidal thoughts    Past Medical History:  Diagnosis Date   ADHD (attention deficit hyperactivity disorder)    Anemia    Anxiety    Bipolar disorder (HCC)    BPH (benign prostatic hyperplasia)    Chronic kidney disease, stage 3b (HCC)    Constipation    DDD (degenerative disc disease), cervical    Depression    Deviated septum    ED (erectile dysfunction)    GERD (gastroesophageal reflux disease)    Graves disease    History of hiatal hernia    Hypertension    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    Hypothyroidism    Pneumonia    PONV (postoperative nausea and vomiting)     Family History  Problem Relation Age of Onset   Prostate cancer Neg Hx    Bladder Cancer Neg Hx    Kidney cancer Neg Hx     Social History   Socioeconomic History   Marital status: Single    Spouse name:  Not on file   Number of children: Not on file   Years of education: Not on file   Highest education level: Not on file  Occupational History   Not on file  Tobacco Use   Smoking status: Former    Current packs/day: 0.00    Types: Cigarettes    Quit date: 2010    Years since quitting: 15.1    Passive exposure: Past   Smokeless tobacco: Former    Types: Associate Professor status: Never Used  Substance and Sexual Activity   Alcohol use: Not Currently   Drug use: Never   Sexual activity: Yes  Other Topics Concern   Not on file  Social History Narrative   Not on file   Social Drivers of Health   Financial Resource Strain: Low Risk  (11/03/2022)   Received from [facility]   Overall Financial Resource Strain (CARDIA)    Difficulty of Paying Living Expenses: Not very hard  Food Insecurity: No Food Insecurity (04/17/2023)   Hunger Vital Sign    Worried About Running Out of Food in the Last Year: Never true    Ran Out of Food in the Last Year: Never true  Transportation Needs: No Transportation Needs (04/17/2023)   PRAPARE - Administrator, Civil Service (Medical): No    Lack of Transportation (Non-Medical): No  Physical Activity: Not on file  Stress: Stress Concern Present (08/12/2020)   Received from [facility] - Occupational Stress Questionnaire    Feeling of Stress : Very much  Social Connections: Unknown (09/01/2021)   Received from [facility]   Social Network    Social Network: Not on file  Intimate Partner Violence: Not At Risk (04/17/2023)   Humiliation, Afraid, Rape, and Kick questionnaire    Fear of Current or Ex-Partner: No    Emotionally Abused: No    Physically Abused: No    Sexually Abused: No    Past Medical History, Surgical history, Social history, and Family history  were reviewed and updated as appropriate.    Please see review of systems for further details on the patient's review from today.   Objective:   Physical Exam:  There were no vitals taken for this visit.  Physical Exam Constitutional:      General: He is not in acute distress.    Appearance: He is well-developed.  Musculoskeletal:        General: No deformity.  Neurological:     Mental Status: He is alert and oriented to person, place, and time.     Cranial Nerves: No dysarthria.     Motor: Tremor present.     Coordination: Coordination normal.     Comments: mild tremor  Psychiatric:        Attention and Perception: Attention and perception normal. He does not perceive auditory or visual hallucinations.        Mood and Affect: Mood is anxious and depressed. Affect is not labile, blunt, angry or tearful.        Speech: Speech normal. Speech is not slurred.        Behavior: Behavior normal. Behavior is not slowed. Behavior is cooperative.        Thought Content: Thought content is not delusional. Thought content includes homicidal and suicidal ideation. Thought content does not include suicidal plan.        Cognition and Memory: Cognition normal. He exhibits impaired recent memory.        Judgment: Judgment normal.     Comments: Insight fair Chronically talkative .directable. But chronically mildly pressured. He is chronically anxious  .  More irritable lately. No manic signs noted. Some wordfinding problems ongoing. Intrusive obsessions episodic HI/SI are the CC but is better since Dec Alert and oriented.      Lab Review:     Component Value Date/Time   NA 144 04/17/2023 0314   NA 135 10/20/2022 1123   NA 142 03/20/2014 0514   K 4.4 04/17/2023 0314   K 4.1 03/20/2014 0514   CL 109 04/17/2023 0314   CL 112 (H) 03/20/2014 0514   CO2 28 04/17/2023 0314   CO2 26 03/20/2014 0514   GLUCOSE 92 04/17/2023 0314   GLUCOSE 99 03/20/2014 0514   BUN 16 04/17/2023 0314   BUN 22 10/20/2022 1123   BUN 7 03/20/2014 0514    CREATININE 1.43 (H) 04/17/2023 0314   CREATININE 0.78 03/20/2014 0514   CALCIUM 8.7 (L) 04/17/2023 0314   CALCIUM 9.5 03/20/2014 0514   PROT 6.5 04/14/2023 1047   PROT 7.3 03/18/2014 1459   ALBUMIN 3.8 04/14/2023 1047   ALBUMIN 3.2 (L) 03/18/2014 1459   AST 21 04/14/2023 1047   AST 16 03/18/2014 1459   ALT 10 04/14/2023 1047   ALT 16 03/18/2014 1459   ALKPHOS 39 04/14/2023 1047   ALKPHOS 105 03/18/2014 1459   BILITOT 0.3 04/14/2023 1047   BILITOT 0.3 03/18/2014 1459   GFRNONAA 56 (L) 04/17/2023 0314   GFRNONAA >60 03/20/2014 0514   GFRAA 54 (L) 04/18/2019 1612   GFRAA >60 03/20/2014 0514       Component Value Date/Time   WBC 3.9 06/15/2023 1123   WBC 6.5 04/21/2023 0946   RBC 4.12 (L) 06/15/2023 1123   RBC 3.92 (L) 04/21/2023 0946   HGB 12.6 (L) 06/15/2023 1123   HCT 39.3 06/15/2023 1123   PLT 456 (H) 06/15/2023 1123   MCV 95 06/15/2023 1123   MCV 89 03/20/2014 0514   MCH 30.6 06/15/2023 1123  MCH 30.9 04/21/2023 0946   MCHC 32.1 06/15/2023 1123   MCHC 32.7 04/21/2023 0946   RDW 13.0 06/15/2023 1123   RDW 12.5 03/20/2014 0514   LYMPHSABS 1.9 06/15/2023 1123   LYMPHSABS 3.2 03/20/2014 0514   MONOABS 0.4 04/21/2023 0946   MONOABS 1.0 03/20/2014 0514   EOSABS 0.3 06/15/2023 1123   EOSABS 0.4 03/20/2014 0514   BASOSABS 0.1 06/15/2023 1123   BASOSABS 0.1 03/20/2014 0514    Lithium Lvl  Date Value Ref Range Status  04/14/2023 0.20 (L) 0.60 - 1.20 mmol/L Final    Comment:    Performed at [facility], [address] [place], [place] [address]   08/12/21 lithium level 0.9 on 450 mg.  lithium level Sept 0.8.   lithium level July 27, 2018 was normal at 1.0.   Lithium level [facility] October 03, 2018 = 1.2. Said he got lithium level at [facility] as requested.  Labs not in Epic.  Recent lipids ok except higher TG than usual.  Normal A1C.  11/10/22 Checked clozapine level on 400 mg HS= cloz 733+norclozapine 294 = total 1027  .res Assessment: Plan:    Cristo  was seen today for follow-up, depression, anxiety, stress and sleeping problem.  Diagnoses and all orders for this visit:  Severe bipolar I disorder, current or most recent episode depressed (HCC)  GAD (generalized anxiety disorder)  Panic disorder with agoraphobia  Insomnia due to mental condition  Other obsessive-compulsive disorders  Mild cognitive impairment  Long term current use of clozapine  Sialorrhea  Low serum vitamin B12  Drug-induced constipation   60 min appt with pt  needs a lot of time   .  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  He usually has mixed bipolar symptoms which we have not been able to completely eliminate.  See long list of meds tried.   He is  chronically unstable .  Mood is relatively stable except for the severe intrusive, ego-dystonic persistent obsessive HI/SI without intent or plan. Chronic frequent ph calls between appts often about the same issue repeatedly most recently re: intrusive ego dystonic thoughts HI/SI.  More like obsessions than true HI/SI.  Ongoing Multiple phone calls about ongoing intrusive, obsessive, ego dystonic HI/SI thoughts without plan or intent.  No recent mania.  Chronic dep and anxiety.  Psych hosp 08/02/22 with SI and acute kidney injury and elevated lithium. Processed this and disc goal of trying to stop lithium but risk of worsening SI.  He has chronic SI and there's risk of this worsening off lithium.  Lithium and clozapine are the most effective meds for SI.    Then recent hosp for SZ episodes.  Fluvox stopped and clozapine reduced to 300 mg HS. Records reviewed with pt and his brother.  Was RX risperidone but he stopped that after DC.  It was ineffective in at least 2 trials.  We discussed the short-term risks associated with benzodiazepines including sedation and increased fall risk among others.  Discussed long-term side effect risk including  dependence, potential withdrawal symptoms, and the potential eventual dose-related risk of dementia.  But recent studies from 2020 dispute this association between benzodiazepines and dementia risk. Newer studies in 2020 do not support an association with dementia.  Unfortunately due to the severity of set his symptoms polypharmacy is a necessity.    Lithium being used bc chronic SI and death thoughts.  Counseled patient regarding potential benefits,  risks, and side effects of lithium to include potential risk of lithium affecting thyroid and renal function.  Discussed need for periodic lab monitoring to determine drug level and to assess for potential adverse effects.  Counseled patient regarding signs and symptoms of lithium toxicity and advised that they notify office immediately or seek urgent medical attention if experiencing these signs and symptoms.  Patient advised to contact office with any questions or concerns.  Reduced lithium to 150 mg daily Dt hx toxicity Checked lithium level and BMP and level 1.1 on 300 mg Daily so reduced again to 150 mg daily and level 0.4 Call if death thoughts worsen or worsening SI.  Disc clozapine and it's level..  Disc data showing less SI with it but he's had no changes.    Disc risk in detail including low WBC with complication, myocarditis.  Extensive discussion of CBC monitoring.  Disc going higher despite level and checking ECG bc cardiac risk at higher doses.   Disc this again bc taking Wegovy might help him to tolerate it better.   clozapine 600 mg HS was Started 12/07/22. Started clozapine 08/05/22. 11/10/22 Checked clozapine level on 400 mg HS= cloz 733+norclozapine 294 = total 1027 Reduced to 300 mg HS at the hospital. CBC every 2 weeks  Use the atropine drops more 2 on each side.  Did he take duloxetine?  Might help depression more.  Consider retry pramipexole off label   But for now continue lorazepam 0.5 mg QID prn and lately BID-TID prn,  but use  LED for cog reasons M administers  For [facility] had to stop rivastigmine DT worsening SI. Neuro [name], [facility] in progress.   EEG on 06/03/23 bc recent sz   We discussed the short-term risks associated with benzodiazepines including sedation and increased fall risk among others.  Discussed long-term side effect risk including dependence, potential withdrawal symptoms, and the potential eventual dose-related risk of dementia.  But recent studies from 2020 dispute this association between benzodiazepines and dementia risk. Newer studies in 2020 do not support an association with dementia.  Discussed safety plan at length with patient.  Advised patient to contact office with any worsening signs and symptoms.  Instructed patient to go to the [facility] emergency room for evaluation if experiencing any acute safety concerns, to include suicidal intent.  He commits to safety. His HI/SI that he complains about are intrusive ego-dystonic obsessions and not true HI SI in that there's no desire or intent ever.  Disc Ozempic and Monjauro for weight loss.  Has had weight gain from meds as a contributor. Lost 45# with Wegovy and now 150#.  Needs to keep up activity as much as possible for depression.  For severe constipation seeing GI Will not make further changes to address this. Had constipation before clozapine and GI appt in June started Amitza which helped some. Needs to push fluids.    Historically he has not done well with serotonergic medicines which would typically be used for obsessive thoughts.  But we are running out of options.  Has not tried with clozapine. BC blood level of clozapine reluctant to increase unless we have to so ...  Explained plan.  Disc SE and will take weeks.  Will use low dose bc some SE when trie dlast time.  Will reduce clozapine to reduce SE risk.  Needs 4 week trial before increasing.   Consider ECT for TR sx.  He's open to it but no decision today.  Disc memory  concerns and mother's memory concerns bc had it before.  Disc counseling.  [email or web address] for therapist.  Most effective therapist will need to be expert in CBT  FU 4 weeks   Albert Staggers, [name], [name]  Future Appointments  Date Time Provider Department Center  07/20/2023  1:00 PM Cottle, Steva Ready., [name] None  08/18/2023  1:00 PM Cottle, Steva Ready., [name] None  09/19/2023  1:00 PM Cottle, Steva Ready., [name] None  12/29/2023  1:30 PM Sondra Come, [name] None    No orders of the defined types were placed in this encounter.      -------------------------------

## 2023-06-29 ENCOUNTER — Other Ambulatory Visit: Payer: Self-pay | Admitting: Psychiatry

## 2023-06-30 LAB — CBC WITH DIFFERENTIAL/PLATELET
Basophils Absolute: 0.1 10*3/uL (ref 0.0–0.2)
Basos: 1 %
EOS (ABSOLUTE): 0.3 10*3/uL (ref 0.0–0.4)
Eos: 6 %
Hematocrit: 38.8 % (ref 37.5–51.0)
Hemoglobin: 13 g/dL (ref 13.0–17.7)
Immature Grans (Abs): 0 10*3/uL (ref 0.0–0.1)
Immature Granulocytes: 0 %
Lymphocytes Absolute: 2.4 10*3/uL (ref 0.7–3.1)
Lymphs: 40 %
MCH: 31.3 pg (ref 26.6–33.0)
MCHC: 33.5 g/dL (ref 31.5–35.7)
MCV: 93 fL (ref 79–97)
Monocytes Absolute: 0.4 10*3/uL (ref 0.1–0.9)
Monocytes: 7 %
Neutrophils Absolute: 2.8 10*3/uL (ref 1.4–7.0)
Neutrophils: 46 %
Platelets: 428 10*3/uL (ref 150–450)
RBC: 4.16 x10E6/uL (ref 4.14–5.80)
RDW: 12.3 % (ref 11.6–15.4)
WBC: 6 10*3/uL (ref 3.4–10.8)

## 2023-07-07 ENCOUNTER — Other Ambulatory Visit: Payer: Self-pay

## 2023-07-13 ENCOUNTER — Other Ambulatory Visit: Payer: Self-pay | Admitting: Psychiatry

## 2023-07-14 ENCOUNTER — Other Ambulatory Visit: Payer: Self-pay | Admitting: Psychiatry

## 2023-07-14 DIAGNOSIS — F314 Bipolar disorder, current episode depressed, severe, without psychotic features: Secondary | ICD-10-CM

## 2023-07-14 LAB — CBC WITH DIFFERENTIAL/PLATELET
Basophils Absolute: 0.1 10*3/uL (ref 0.0–0.2)
Basos: 2 %
EOS (ABSOLUTE): 0.3 10*3/uL (ref 0.0–0.4)
Eos: 6 %
Hematocrit: 37.8 % (ref 37.5–51.0)
Hemoglobin: 12.5 g/dL — ABNORMAL LOW (ref 13.0–17.7)
Immature Grans (Abs): 0 10*3/uL (ref 0.0–0.1)
Immature Granulocytes: 0 %
Lymphocytes Absolute: 2.2 10*3/uL (ref 0.7–3.1)
Lymphs: 46 %
MCH: 31.3 pg (ref 26.6–33.0)
MCHC: 33.1 g/dL (ref 31.5–35.7)
MCV: 95 fL (ref 79–97)
Monocytes Absolute: 0.3 10*3/uL (ref 0.1–0.9)
Monocytes: 6 %
Neutrophils Absolute: 1.9 10*3/uL (ref 1.4–7.0)
Neutrophils: 40 %
Platelets: 431 10*3/uL (ref 150–450)
RBC: 4 x10E6/uL — ABNORMAL LOW (ref 4.14–5.80)
RDW: 12.4 % (ref 11.6–15.4)
WBC: 4.8 10*3/uL (ref 3.4–10.8)

## 2023-07-20 ENCOUNTER — Encounter: Payer: Self-pay | Admitting: Psychiatry

## 2023-07-20 ENCOUNTER — Ambulatory Visit (INDEPENDENT_AMBULATORY_CARE_PROVIDER_SITE_OTHER): Payer: Medicare PPO | Admitting: Psychiatry

## 2023-07-20 DIAGNOSIS — F314 Bipolar disorder, current episode depressed, severe, without psychotic features: Secondary | ICD-10-CM

## 2023-07-20 DIAGNOSIS — F4001 Agoraphobia with panic disorder: Secondary | ICD-10-CM

## 2023-07-20 DIAGNOSIS — K117 Disturbances of salivary secretion: Secondary | ICD-10-CM

## 2023-07-20 DIAGNOSIS — F5105 Insomnia due to other mental disorder: Secondary | ICD-10-CM | POA: Diagnosis not present

## 2023-07-20 DIAGNOSIS — R569 Unspecified convulsions: Secondary | ICD-10-CM

## 2023-07-20 DIAGNOSIS — Z79899 Other long term (current) drug therapy: Secondary | ICD-10-CM

## 2023-07-20 DIAGNOSIS — F428 Other obsessive-compulsive disorder: Secondary | ICD-10-CM

## 2023-07-20 DIAGNOSIS — G3184 Mild cognitive impairment, so stated: Secondary | ICD-10-CM

## 2023-07-20 DIAGNOSIS — F411 Generalized anxiety disorder: Secondary | ICD-10-CM

## 2023-07-20 DIAGNOSIS — E538 Deficiency of other specified B group vitamins: Secondary | ICD-10-CM

## 2023-07-20 MED ORDER — CLOZAPINE 100 MG PO TABS: 300.0000 mg | ORAL_TABLET | Freq: Every day | ORAL | 3 refills | Status: DC

## 2023-07-20 NOTE — Progress Notes (Signed)
 Albert [name] [id number] Jul 05, 1965 58 y.o.     Subjective:   Patient ID:  Albert [name] is a 58 y.o. (DOB [birth year]) male.  Chief Complaint:  Chief Complaint  Patient presents with   Follow-up   Depression   Anxiety   Medication Reaction      Albert [name] presents to the office today for follow-up of mixed bipolar, anxiety and still grieving stress of breakup.  He requires frequent follow-up because of long-term symptoms.  At visit October 25, 2018.  We made several medicine changes because of his concerns about sleep and other issues.  We increased olanzapine back to 7.5 mg bc not sleeping as well at 5 mg.  He has to have the prescription for quetiapine written for up to 3 tablets at night in case insomnia is worse because insomnia makes his mood disorder so much worse.  We discussed that was above the usual max but the request was granted given his treatment resistant status. We also reduce lithium from 900 mg daily to 750 mg daily to try to reduce tremor and muscle twitches.  At  visit March 28, 2019.  Fluoxetine was increased to 40 mg daily.   Early January 2021 increased to 60 mg daily.  No SE.  seen June 28, 2019.  No further meds were changed except olanzapine was increased back to 10 mg daily to see if if he could get additional benefit per his request.  Covid vaccinated.  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  Then had cholecystectomy.  April 2021 appt with the following noted: 2 episodes night sweats lately.  Memory has been very bad lately.  Repeatedly asks mother questions. Still  tend to stay in his room and his bed.  Still has rapid cycling mood swings.  Maybe some better with increase in olanzapine to 10 and tolerating itl.  Would like to get out more but can't DT Covid.  Can perform necessary chores.  Will get out of the house when he can.  No change in death thoughts and anxiety in intensity but is better with frequency.  Usually comes and goes in waves but more persistent.   Consistent with meds.  Ativan not helping anxiety very dramatically but he's not sure.  Fidgety.  No trigger other than still grieving relationship and can't get it out of his head.  Poor energy, concentration and more forgetful. In bed more and less active.  Sleep ok lately which is unusual.  Can concentrate on financial matters and stock market at times.  Tolerating meds.  Still some intrusive SI without reason. Less frequent obsessive thoughts about broken relationship and has been doing this for months.  Can't let it go.  Loop.   No unusual stress even with the family who is supportive. Likes the benefit that Zyprexa gives. No sleepwalking nor falling nor odd behavior.  No history of sleep walking.  He understands this may recur with an increase.  Also wants option to rarely take extra quetiapine 300 for sleep prn. Plan:  Try to reduce lorazepam if possible.  10/22/2019 appointment with the following noted: Continues fluoxetine 60, lithium 600 mg, lorazepam 2 mg AM and HS and 1 mg midday, olanzapine 10, quetiapine 600 mg HS. Still anxious chronically and including driving in crowded spaces.  Doesn't thing Ativan helps as well as Xanax but less cognitive problems. Sleep is not as good.  Occ EFA.  More EMA and wanting to do things in the middle  of the night but this is not typical.  Wonders about why that happens.  Some napping.   Tolerating meds.  Asks about weight loss meds.  Disc this in detail.   Plan no changes except OK meclizine prn vertigo.  02/11/20 appt with the following noted: Able to gradually reduce lorazepam to 1 mg AM and HS. More depressed over time and less interested in things and less interest in going out but does with his parents. Has reduced from 800 to 600 mg HS with some awakening but usually able to go back to sleep.[name]  Spending a good amount of time in bed bc watches TV in bedroom.  Parents watch TV in different part of the house.  No mood swings he notices.   Concerns  about weight gain about 200#. Likes olanzapine's benefit for sleep. Plan: Trial for TRD to  Increase fluoxetine to 80 mg daily  to use the combo with olanzapine for TR bipolar depression.   04/21/20 appt with following noted: I thought in beginning some benefit with fluoxetine.  Lifelong negative thinking continues.   Not much bipolar.  Reads a lot on bipolar.   Still tired and anxious a little more may be seasonal.  No familial stressors.  Tends to lay down in afternoon.  Anxiety not over anything in particular.   No SE with fluoxetine. Took meclizine prn.  Easily motion sick.    Asks questions about newer drugs for bipolar like Caplyta. Plan:  No med changes  06/30/20 appt noted: Tired of wearing masks.  Vaccinated.  Asked questions about when this will end with mask mandate.  Mood up and down some since here and getting up 2-3 times.  Disc awakening problems.  Trying to do better bc night eating some.   Mood is about average today so far.  Albert [name] out with friends for breakfast.  Recognizes activity helps mood.   3 days in a row napped a lot in the afternoon.   Bed is a comfort place.  Gets bored and hard to motivate.  Tries to stay away from night eating and spending.   More depressed when alone and wonders about med changes. Plan: Failed response to olanzapine + fluoxetine for TR bipolar depression.   Reduce fluoxetine to 1 daily and reduce olanzapine to one half nightly for 10 days and then stop it. Then start Caplyta 1 daily  08/27/2020 appointment with the following noted: Patient decompensated with the above switch to Caplyta with intrusive suicidal thoughts and had to be hospitalized for psychiatric reasons.  Multiple phone calls with family members since that time.  Spoke with the psychiatric nurse practitioner with the decision to restart olanzapine in place of the Caplyta given the patient was more stable while on the combination of Seroquel and olanzapine than with the  Caplyta. Caplyta triggered HI/SI and depressed and confused on it, and agitated and still has some of it now. No akathisia. Frustrated with lack of therapy at the hospital.   Hydroxyzine didn't help jitteriness.  Feels some better.  Fleeting SI & HI but not obsessive like it was prior to hospitalization.  Feels a little amped up and nervous.  Scared of what could happen.  Apprehensive being here talking about things. 5-6 hours sleep last night and would like to have more. At mom and dad's house right now and up more in the day. Inadvertently stopped lorazepam abruptly by mistake likely contributing to shakiness. Still some memory issues.  Trying to stay out  of bed when watching TV.  Some awakening but managing. Occ fleeting SI and distracts himself.   Always spends a lot of time just laying around.   Can have anxiety for no reason like coming here, and varies in intensity without pattern.      Less panic than in the past.  Some chronic depression and hyperactivity and loudness and hyperverbal.  Usually back to sleep.  Total 6 hours but some napping, awakens 2-4 times nightly but back to sleep..  Taking quetiapine 600 mg HS.  still lacks interest and motivation.  Can follow a TV show if interested. Plan: Restart lorazepam 1 mg in the morning, 1 mg in the afternoon and 1 mg at night for anxiety Stop hydroxyzine  Increase olanzapine to 1 and 1/2 of 10 mg tablets in evening  09/05/2020 appt noted: seen with parents today at his request Made med changes noted and tolerated the changes Better than I did last week. Now only fleeting HI/SI and "no where near what it was".  Sleeping better.  Still not a lot of energy.  Anhedonia.  Less agitation.  A lot of anxiety all the time but it's helping.  Would like more energy and motivation.   Doesn't handle stress well. Parents note he's less shakey.  He's still staying with his parents.  Only driven once since this happened and F wants him to be able to drive and  function more independently.  M agrees he's better.  Memory is better but not normal.  Primarily STM problems No akathisia with olanzapine this time.    Administering his own meds but mo watched. Slept 8 hours last night. Plan consider further reductions in quetiapine  10/17/20 appt noted: Stayed on Seroquel 600 HS and olanzapine 15. Olanzapine seemed to do most for the sleep.  Also helping eliminated HI/SI for the most part.  Wants to try to increase it.  Depression is much better with less rumination also.  Fears akathisia at higher dose as in the past. Also on fluoxetine 40.   Plan: Reduce Seroquel to 1 and 1/2 tablets at night Increase olanzapine to 1 of the 15 mg tablet and 1/2 of the 5 mg tablet for 1 week,  Then, if tolerated increase the olanzapine to 20 mg or 1 of the 15 mg tablets and 1 of the 5 mg tablets. If possible then also reduce quetiapine to 1 tablet at night.  11/05/2020 appointment with the following noted: Up to olanzapine 20 mg hs for 3 days.  Reduced Serooquel to 450 mg HS.  Didn't try to go lower. Had hiatal hernia and umbilical hernia surgery recently with no problem.   No akathisia so far. Sleep ok with meds so far changes.  No mood changes yet but overall likes the smoothness of olanzapine and helping sleep without knociing him out. Still some occ HI/SI, he thinks bc of anxiety generally.  Easily anxious. Plan: Continue olanzapine 20 mg nightly longer to give it time to help mood sx reduce quetiapine to 1 tablet at night to minimize polypharmacy and reduce risk of akathisia.  01/07/21 appt noted: Able to reduce quetiapine 300 mg HS ok with decent sleep but still wakes a lot.  No AM hangover.  Don't have a lot to do.  Can enjoy going out and doing things.  But doesn't do it longterm.  Family is doing good.  Father started year 91 at his job.   Tolerating the meds well.  May wake more with  quetiapine reduction. Lost 10#. Wonders if could try other meds he didn't  tolerate before bc now can tolerate olanzapine and couldn't tolerate it in the past DT akathisia. Mood never great but can swing into depresssion without reason.   Plan: Continue olanzapine 20 mg nightly longer to give it time to help mood sx reduce quetiapine to 1/2 of the 300 mg  tablet at night to minimize polypharmacy and reduce risk of akathisia.  02/02/2021 appointment with the following noted: Tried reduced quetiapine to 150 mg and after a week had withdrawal sx including jittery and SI and he increased to 250 mg daily.  Felt better after increasing to 300 mg daily and last week reduced to 250 mg daily.  After increase quetiapine felt better in a few days.  Seems more alert with less quetiapine but can't tolerate a quick withdrawal.   Some awakening.  Plan: Reduce quetiapine by 50 mg every 2-4 weeks.  03/09/2021 phone call: Pt called and said that he is weaning off the seroquel. He was down to 200 mg of the seoquel and he started having withdrawal symptoms and sucidal thoughts. He went back up to 250 mg because he knew he was good on that.    Pt stated he starting back taking 250 mg on Saturday due to the symptoms he was having.Now he is no longer having suicidal thoughts but still very jittery. Albert [name]: He may just need to taper slower than most people.  If he can tolerate the 250 mg Seroquel with the jitteriness it should get better over the next 1 to 2 weeks.  Then he can try going down again by 50 mg at a time.  He probably needs to go down by just 50 mg every 2 to 4 weeks and wait till he fully adjusts to each reduction before he reduces again.  03/17/21 appt noted: Hernia surgery 2 weeks ago. Seroquel 250 mg for a month then 200 mg daily and had SI and increased again to 300 mg daily.  Would love to get off it.  Withdrawal triggers intrusive HI/SI but otherwise doesn't have them No SE. Sleep is OK but not enough REM sleep.  Satisfied with other meds.   No current HI/SI.  No paranoia.  Still  has anxiety and taking Ativan every 6 hours.  It works but doesn't cure it. Chronic anxiety and ask about ketamine for chronic depression. Plan: Because he had WD reducing from 250 to 200 mg daily will have to go slowly. Reduce quetiapine by 25 mg every 2-4 weeks. Reduce Seroquel to 250 mg for 2 weeks, Then reduce to 225 mg for 2 weeks, Then reduce to 200 mg for 2 weeks, Then reduce to 175 mg for 2 weeks, Then reduce to 150 mg Also: Buspirone 30 mg tablets for anxiety Start 1/3 tablet twice daily for 1 week Then increase to two thirds twice daily for 1 week Then increase to 1 tablet twice daily  04/15/2021 appointment with the following noted: Reduced Seroquel to 200 mg HS and increased buspirone to 30 mg BID STM px and wants to try to reduce Ativan to see if it is better.  But fears with drawal sx. Parents complain about his memory. Less HI and SI since here. Less depressed. Plan: Buspirone 30 mg tablets for anxiety Start 1/3 tablet twice daily for 1 week Then increase to two thirds twice daily for 1 week Then increase to 1 tablet twice daily Continue olanzapine 20 mg nightly longer to  give it time to help mood sx Because he had WD reducing from 250 to 200 mg daily will have to go slowly. Reduce quetiapine by 25 mg every 2-4 weeks. Reduce Seroquel to 250 mg for 2 weeks, Then reduce to 225 mg for 2 weeks, Then reduce to 200 mg for 2 weeks, Then reduce to 175 mg for 2 weeks, Then reduce to 150 mg Trial taper lorazepam: In hopes of improving short-term memory  04/30/2022 phone call: Complaining of withdrawal symptoms reducing lorazepam from 0.5 mg 5 times a day to 0.5 mg 4 times daily.  05/21/2021 appointment with the following noted: Gotten onto buspar 30 BID and down on lorazepam to 0.5 mg QID. Gotten down to Seroquel 200 mg HS A little restless every now and then but able to reduce lorazepam for the last weeks. Is too inactive and lays in bed too much watching TV.   Can' t  tell effect of buspirone bc of other changes.  Sleep with awakening with 6-7 hours. Fleeting SI without trigger or plan or intent. Depressed more than anxious. Enjoys watching stocks and investing. Plan: Trial taper lorazepam gradually over time In hopes of improving short-term memory But for now continue lorazepam 0.5 mg QID Start clonidine 0.1 mg tablets one half at night for 3 nights, then 1/2 tablet twice daily for 3 nights, then one half in the morning and 1 tablet at night If clonidine does not noticeably help anxiety call the office  05/27/2021 phone call initially complaining of homicidal and suicidal thoughts after taking 1 mg of clonidine.  He was told to stop the medication.  But then called back stating he was having homicidal and suicidal thoughts from withdrawal from the medication which was not logical.  He agreed it was not logical and stated on 05/29/2021 that he felt better  06/23/2021 appointment the following noted: No clonidine. Sunday panic attack eating out and then having SI.  Stayed with father to feel safer. Intrusive thoughts of hurting parents or others.  HI only when has SI.  Usually panic without SI.  Then had intrusive thoughts about hurting others.   Usually intrusive thoughts are brief and fleeting.    07/20/21  appt noted; More anxious with less lithium.  More anxious in his body.  Not more depressed or irritable.  Sleep is the same.  More racing thoughts.  Chronic SI probably a litte worse but not unmanageable. Rduced lithium last week to 300 mg daily. No recent change in lisinopril. Off the clonidine for a week or so. Doesn't drink much water.  Drinks a lot of diet coke. No marked akathisia or RLS but does shake his leg DT anxiety. Worries about needing mor emedicine. Plan: Increase olanzapine to 25 mg nightly bc more racing thoughts and anxiety with less lithium DT severity of sx and difficulty getting off Seroquel conside rincrease.  07/30/21 TC : CO  racing SI today and wonders about whether increased sx racing thoughts, hyperactivity, SI related to Reduced lithium from 600 mg daily to 300 mg daily recently DT lithium level 1.8.  could be related.    Plan: increase olanzapine 30 mg pm Today take lithium 600 mg now.  Tomorrow start lithium CR 450 mg daily Continue olanzapine 30 mg PM until SI resolves unless akathisia returns.  Albert Staggers, Albert [name], Albert [name]  08/17/21 appt noted:  No akathisia with olanzapine 30 mg pm (about 3 weeks) and is sleeping better. Increased lithium to 450 mg daily. Tolerating meds. Still  racing negative thoughts maybe some better.  Asked about it. SI come and go.   Anxiety about the same as depression.  No motivation unless has to do something. Plan: Reduce  fluoxetine 20 mg 1 daily in hopes racing thoughts and anxiety are better.  09/14/2021 appointment with the following noted: No difference noted with reduction fluoxetine 20 mg daily. Most of the time negative thoughts, ex "what if I get in a wreck?"  Frequent negative thoughts.   Still on lorazepam 0.5 mg TID.   Wonders about trying prior meds that caused akathisia bc no longer gets it with olanzapine 30 mg daily. Sleep is usually ok.  Taking Seroquel 200 mg HS.  Thinks the olanzapine helps his sleep and doesn't want to stop it. Panic 1-2 times per month.  Then takes extra lorazepam. Plan: DC fluoxetine 20 mg 1 daily  Trial Auvelity off label for depression 1 in the AM for 1 week then 1 in AM and 1 with evening meal  10/15/2021 appointment with the following noted: Occ of getting confused as to day and went to church on a Thursday my mistake.   Had it happen a few months ago too.  Memory is terrible. Thinks he couldn't tolerate more Auvelity. Not a lot of benefit from [place] 1 daily. Still quite a bit of anxiety.   A little drowsy  and sleeps 7-8 hours at night. Word finding problems.  Lost wallet. Plan: DC  Auvelity  For [facility]: memantine 1/2 tablet in the  AM for 1 week, then 1/2 tablet twice daily, then 1/2 tablet in the AM and 1 tablet at night for 1 week then 1 tablet twice daily  10/22/2021 phone call complaining of additional stress asking to start new medication because of anxiety and more suicidal thoughts without intent or plan.  Reports taking more than 4 prescribed Ativan a day recently.  Asking to increase olanzapine which is already at 30 mg daily. Albert [name] response:We cannot go any higher in the dosage of olanzapine that he is currently taking.  A lot of these suicidal thoughts are apparently anxiety driven.  Let us have him resume taking paroxetine which is one of the more effective medicines for anxiety.  I will send it in and the instructions will be paroxetine 20 mg tablets 1/2 tablet daily for 1 week then 1 tablet daily  11/16/21 appt noted: Multiple phone calls since she was here.  Complaining of suicidal thoughts from memantine which was stopped. Started paroxetine and up to 20 mg daily.  Continues olanzapine 30 mg, lithium 450 mg nightly, lorazepam 0.5 mg 4 times daily , And quetiapine 200 mg nightly. Questions about whether memantine caused the neg SI really bc has them at times. When has SI often anxiety driven.  Will tend to ruminate on past relationship ended.   Edgy and irritable all the time but memantine seemed to make it worse. Mind is getting better with less SI lately.   No SE with paroxetine.   Aggrivated by forgetfulness.  eXample, talked with someone about going to breakfast last night and then forgot to go this mornin.g. Plan prescribed donepezil 5 mg daily for cognitive complaints specifically forgetfulness  12/31/2021 appointment with the following noted: He had been prescribed donepezil 5 mg for cognitive complaints.  He called back reporting he felt it was causing homicidal thoughts and was instructed to stop it.  He has had these types of thoughts in the past.  He had no desire to act  on them. Discouraged. Ongoing  depression and reduced enjoyment and negative thoughts.  Can enjoy some things. A lot of anxiety chronically.  Trying to limit Ativan to 5 of 0.5 mg daily.  Sometimes needs 2. Constipation for years.  02/03/22 appt noted: Depression makes LBP worse. Ongoing chronic depression. Tolerated rivastigmine without triggering SI Chronic anxiety and Ativan helps some. Occ panic but not usual.  Asks if any other meds coud be used. Plan: But for now continue lorazepam 0.5 mg QID, but use LED for cog reasons Increase rivastigmine to 3 mg capsule 1 twice daily or 2 of the 1.5 mg capsules twice daily.  Call if you have problems with nausea Start dextromethorphan 1 capsule twice daily . Thi sis off label for depression based on MOA of Auvelity and use of paroxetine to prolong half life of DM. Presence of paroxetine may have been reason he didn't tolerate Auvelity brief trial before.  Will proceed slowly.  03/17/2022 appointment noted: Current psych meds: Rivastigmine 3 mg twice daily, quetiapine 200 mg nightly, paroxetine 40 mg daily, olanzapine 30 mg nightly, Namenda 10 mg daily, lorazepam 0.5 mg 4 times daily, lithium CR 450 mg nightly, dextromethorphan 15 mg twice daily A lot of anxiety since here.  Some days taken more lorazepam to 6-7 tablets daily.  No major triggers but anxious even about coming here and other normal things.  Cancelled trip to collect rocks with friend and cancelled the trip bc anxious.  Usually if can go he is ok.  Lots of SI ongoing Memory might be a little better. Asks how he responded to [employer].  Will check paper chart.  03/24/22 TC:  CO SI worse and restilless with Rivastigmine 9 mg . Albert [name] response:  Stop the rivastigmine since the [place] appears to be having side effects from it.  He was having anxiety at the last appointment 2 and then we increased the dose.  This might be causing the anxiety to be worse. He has chronic suicidal thoughts without intent or plan.  However it appears  the thoughts are more intense with the rivastigmine     05/06/21 appt noted: Stopped rivastigmine DT anxiety and worsening SI.  Was better the next day. This week had some SI.  Thinking about the prior relationship tends to lead to SI. Will take Ativan when gets SI and it helps a little at 0.5 mg at a time. Plan: Consider fluvoxamine.  Yes this may help with obsessions on old GF and he failed other optioons. Start fluvoxamine 1/2 of 50 mg tablet and reduce paroxetine to 1 and 1/2 of 20 mg tablets for 5 days, Then increase fluvoxamine to 1 tablet daily and reduce paroxetine to 1 tablet daily for 5 days, Then increase fluvoxamine to 1-1/2 tablets daily and reduce paroxetine to 1/2 tablet daily for 5 days, Then increase fluvoxamine to 2 tablets daily and stop paroxetine   06/07/22 appt noted: Feels like the fluvoxamine 50 BID is too much.  On this a couple of weeks. Worries he might be worse with this with regard to SI.    He thinks it's worse with increase.   New neighbor who has a young boy. He worries that Reginal Lutes is triggering depression and intrusive SI/HI a couple of days later and lasts a little while. Has had anxiety turning into panic attacks.  This gets associated with depression and SI/HI.  These thoughts are intrusive and he doesn't want to act on them.  Can have HI even towards  family with whom he has no negative emotions.  Little things like a bump up in parking lot sets off his depression and anxiety and doesn't seem to handle things.   No sig akathisia.  In the past it's been continuous.   Awakens every 2-3 hours.  Usually goes back to sleep with meds. Has not had alcohol in 20 years and had some intrusive thoughts without drinking.   Plan: As soon as he feels comfortable increase fluvoxamine to 50 mg AM and 100 mg PM for intrusive thoughts of HI/SI and obs on GF  07/06/22 appt noted: Increased fluvoxamine to 150 mg daily and felt more agitated and some SI so reduced to 100 mg 4 days  ago and feels better so far.  Likes the fact it is good for anxiety and obs thoughts.  Wonders if there is a similar med.  Frustrated he can't get relief.  Some improvement in obs on exGF but not resolved. Continue other meds: lithium CR 450 mg HS, lorazepam 0.5 mg QID, Memantine 10 BID, olanzapine 30 mg HS, quetiapine 200 mg HS Tingling and burning in legs at night but doesn't interfere with sleep. Got insurance to pay for [facility] and lost 13#.   No full panic lately. But some anxiety waves.   Still major problems with memory. Plan: Wean fluvoxamine 50 mg daily for a month then 1/2 daily for 2 weeks then stop it DT  NR.  Watch for more anxiety.  07/28/22 TC: Albert [name], CMA  to Me     07/28/22  4:16 PM Note Mom called reporting patient has been confused, dizzy, and sluggish for the last 3 days. He is now staying with her. She had several questions in regards to this:   Lithium level - she feels he needs one done, last result in Epic was 08/2021 that I see. She would like order sent to [facility] for tomorrow.   Patient is on Wegovy, has been for about a month, and she wonders if this could cause sx.   Patient has meclizine and she is questioning if she should give that for his dizziness considering other sx.   She questions if perhaps he got up during the night and took additional medications, though doesn't know if this happened and if so what he took.    On 3/5 visit he was to wean fluvoxamine to 50 mg qd for a month and then 1/2 tab for 2 weeks and then stop. Mom and pt unsure if he started the wean.    He has F/U 4/4.        Me  to Albert [name], CMA   CC   07/28/22  5:44 PM Note Yes get lithium level in the AM and don't take any lithium tonight or in the morning before the blood test.     Reginal Lutes should not cause these sx except if he is losing wt quickly or is dehydrated it can cause lithium toxicity and include these sx.  He needs to drink lots of water.   He should go  ahead and wean the fluvoxamine as instructed in the last appt.   If the sx get worse, go to the ER.    Me   CC   07/29/22  6:49 PM Note RTC   Sx not as severe as when he had to go to hospital in the past.  Albert [name] got Lithium level today.   Speech is not good .  Confused at  times, but better today.  A little tremor .  A little balance problems.  No falls.  No diarrhea Took lihtium 450 last night. Mo says he got up in the middle of the night and moved things around last night and he doesn't rmember.   No change in night time meds.   Gradually reducing fluvoxamine.   No extra lorazepam.     Spoke with mother who verifies the same.   No med changes but push fluids.   Albert Staggers, Albert [name], Albert [name]      08/05/22 appt : with parents Pt hosp 08/02/22 with acute kidney injury and somewhat elevated lithium level with some confusion.  Spoke with psych NP at hosp and lithium was decreased to 450 mg HS. No pending appt known with medical doctors about kidney function. Don't feel good and having HI and SI and feels he needs lithium for these issues.  Doesn't feel he should have been discharged.   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 No added meds at night but has been confused at night and getting up multiple times and doing things he doesn't remember.  This concerns parents.  Parents been up at night with him and he doesn't make sense.  Still groggy in the am per mo for an hour.   Plan: Plan: Get lithium level and kidney test ASAP Stop fluvoxamine Reduce lithium 300 mg capsule 1 at night Monday night: reduce to olanzapine to 20 mg at night (stop the 10 mg tablet) and start clozapine prescription. Monday 4/15 : increase clozapine to 100 mg at night and reduce quetiapine to 1/2 of 200 mg tablet and continue olanzapine 20 mg each evening.  08/01/22 & 08/11/22 ED for HI and SI  08/12/22 TC mother:  Mother, Patsy called. Per note made by Larinda Buttery. He was referred to [facility].Reporting that he did go up  there last night. This morning they called her and told her to come pick him up. They told her he wasn't having homicidal or suicidal thoughts and it would be four to five days of him waiting to go to a facility in [place]. They recommended him for PHP to start 4/16. She picked him up today. He is worried about these thoughts coming back up. Also, [facility] came out yesterday to assess him and offered counseling from [facility]. He wants to do counseling with them.  Any input on care he can receive?  He is so discouraged and wants to be safe.  (She notes that the [facility] gave him- Zyprexa 10mg  at 1:07am and again at 1:25am per the med list- based on his previous med list) He was supposed to be off of Zyprexa10mg . Has been on  Clozapine since Monday. He did go ahead and take it before he left ( 2pills) so he hasn't missed a dose and did go get labwork done. Is wanting to continue the Clozapine, please check the labwork to be able to fill the 100mg  dose.    Albert [name] resp:  Continue all meds as RX except go ahead and increase clozapine to 100 mg tablets, 1 each night as soon as he can get the 100 mg tablets from pharmacy.  RX sent.  Trying to speed up his chance of response by getting it to a hgiher dose.  Clozapine is very effective for HI/SI.  If he gets worsening HI/SI in the interim it will not be caused by meds but just the natural waxing and waining of his sx.  Once clozapine dose  gets high enough it should help but avg patient needs 300 mg so we have to just be patient.      08/20/22 appt:  Up to 100 mg clozapine HS SE constipation and using Colace, Miralax.  Been to [facility] twice  since here.   Ongoing continuous HI/SI.   All I want to do is go to sleep to escape.  Dep causing the thoughts.   Taking Ativan 0.5 mg BID-QID.  He thinks mother not giving him enough of it.  He thinks anxiety triggers HI/SI.  He fears HI and hopes he's strong enough to reisist.  Not angry or wanting to hurt anyone in reality but has the intrusive  thoughts. Conc and memory still not good.   Sleep not much different.   Staying with parents currently. Dep gotten worse M worrying he's dependent on lorazepam.   Confusion at night is no longer happening.  Not wandering at night.  Plan: For severe constipation start Linzess 145 mg AM Get lithium level and kidney test ASAP Stopped fluvoxamine and confusion at night is gone  lithium 300 mg capsule 1 at night  olanzapine to 20 mg at night  increase clozapine to 150 mg at night for 3 nights then 200 mg HS   olanzapine 20 mg each evening. Continue lorazepam 0.5 mg QID, needs this and informed mother  09/07/22 appt noted: Psych meds: clozapine 250 MG HS, lithium 300 mg HS, lorazepam 0.5 mg BID-QID, olanzapine 20 .  Off Seroquel, linzess 290 mg daily, Miralax, Colace. Sleep 7-8 hours without change from before clozapine.   Mood is still very depressed and still gets anxiety. Still problems with constipation.   Dep is so much it is causing intrusive HI/SI.   No more bizarre behaviors at night.   Plan: Plan: Get lithium level and kidney test ASAP  lithium 300 mg capsule 1 at night  olanzapine to 20 mg at night  increase clozapine to  300 mg HS as soon as tolerated and then 400 mg HS.  olanzapine 20 mg each evening. Continue lorazepam 0.5 mg QID, needs this and informed mother  10/07/22 appt noted: alone and with parents Rough month.  Altered voice for a couple of weeks or so..  Mind not good.  Feel out of sorts.  Jittery.  Constipation.  No appetite.  On Wegovy.  Saw GI doc.   May need to reduce Wegovy bc poor appetite and constipation.  Lost 25 #. Current psych med: clozapine 400 mg HS about a week or 2, olanzapine 20 mg HS, lithium 300 mg HS, lorazepam 0.5 mg QID. Reduced intrusive thoughts.  2 nights since here night eating.   Sleep 6 hours.  Limited napping during the day.   Anhedonia.  Lower motivation.  Tremor worse. M says he sleeps pretty well but awakens  BP is under  control. Plan Reduce lithium to 150 mg capsule 1 at night DT level higher Reduce  olanzapine to 10 mg at night  Continue clozapine 400 mg HS bc helping SI and HI and  Continue lorazepam 0.5 mg QID, needs this and informed mother  11/08/22 appt noted:  alone and with parents. Ongoing calls between visits Doesn't think clozapine helping him sleep as well as olanzapine for sleep or any other benefit. While at restaurant had intrusive HI/SI. Staying with parents seems to provoke intrusive HI.  Is grateful to them.  Sat woke up with them.  Sometimes goes away and sometimes not.  No know trigger  and no intent. Saw PCP.  Off Wegovy.  Started metformin. No effect noted from clozapine.   M limits his lorazepam.  PCP referring him to neuro to FU abnormal head scan.  He wonders if his speech and memory problems are related.   Plan: Cont clozapine 400 mg HS Check clozapine level and then direct dosing.  11/16/22 TC from pt to disc level:  Albert [name] resp:  Clozapine level ok but may benefit with increase to 500 mg nightly.  Tell him to increase with current supply.     12/06/22 TC from pt:  complaining of worsening intrusive ego-dystonic HI/SI without intent, desire or plan.   Albert [name] resp ok to increase clozapine to 600 mg HS.  12/09/22 appt noted: Complaining of ongoing racing thoughts and worsening HI/SI without intent or desire or plan.  Racing thoughts chronic and other thoughts wax and wane.  Usually triggered if starts thinking of something negative Meds: clozapine 600 mg HS, lithium 150, lorazepam 0.5 mg QID, olanzapine 10 HS Sleep with EFA.   Will have trouble going to sleep sometimes with thoughts. Doesn't think clozapine makes him sleepy.  Not really drowsy daytime. No dx OSA and no known apnea.  SE a little dizzy.  Constipation managed with BM every 2-3 days. Better than before.  PCP RX metformin and it helped. Plan: Plan for intrusive HI/SI wean olanzapine and trial of risperidone.  Reduce olanzapine  to 5 mg HS for 1 week then stop it.  Addressed his fears of not sleeping off olanzapine. Then if sleeping start risperidone.  01/11/23 appt noted:  also seen with parents Psych meds: clozapine 600, lorazepam 0.5 mg QID, No olanzpaine ,  lithium 150 mg daily, no risperidone yet. Lost 45# on Wegovy down to 150#.   Still having intrusive HI/SI as noted before. Sleep no worse.  Takes a couple of hours after clozapine to sleep.  EFA 2-3 times nocturia.   Still racing thoughts.  Ongoing px with conc, memory,  planning Scored 73 on 100 pt test for cognition.   Some trouble with articulation and speech at times.   "If could get rid of racing and intrusive thoughts I'd be 100% better." Neuro says no stroke history and low B12 and workup ongoing at [facility], Dr. Donia Pounds. Memory is still terrible.   Plan: Start risperidone 2 mg tablet, 1 at night for racing thoughts and intrusive homicidal and suicidal thoughts. Call in 3 weeks if not better and we will increase the dose  02/09/23 appt: seen also with parents , 60 min  Lost to 152#. Meds: as above, risperidone incr to 4.5 mg HS for intrusive HI/SI SE drooling off an on.  Tremor mainly worse with a little more risperidone HI/SI intrusive thoughts wax and wane.  Worse when lays down. Takes 2 hours to fall asleep.  Needs to get OOB when awakens.  6 hours sleep. No falls.   Would rather try to increase risperidone than other options.    04/12/23 appt virtual:  seen alone CC:  Multiple phone calls about ongoing intrusive, obsessive, ego dystonic HI/SI thoughts without plan or intent. Didn't come to the office bc of these thoughts.  I know I don't want to do it.  Can last four hours.  Sees drug advertisements that talk about SI and that will trigger obsessions about it. Meds: clozapine 600 mg HS,  lithium 150 mg HS, lorazepam 0.5 mg TID, risperidone increased to 8 mg and now back to 4mg  HS.  Neuro sched MRI tomorrow. Enough sleep 5-6 hours at night.  Not  napping.   Sometimes gets confused with med but not ususally.   No recent mania.   Depressed and anxious. Plan: AVS: Reduce risperidone to 1/2 of 4 mg tablet in evening for 1 week then stop it. Reduce clozapine to 5 tablets nightly Start fluvoxamine 100 mg tablet 1/2 tablet nightly for 1 week then 1 tablet nightly for obsessive, intrusive thoughts  05/25/23 appt in person:  not with parents, met with [facility] with 2 sz and then had psych hosp since here.  Another EEG 06/03/23.  Neuro DR.  Clelia Croft.  RX Keppra.  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 Psych med: clozapine reduced to 300 HS, stopped ? Risperidone he's not sure, clonazepam 0.5 mg BID, lithium 150 HS, trazodone 50 prn rare.  Keppra 500 BID. Trazodone good sleep med. SE drooling again.   Not sure any mental health effect of Keppra.   Denies any street drugs despite positive UDS for ecstasy. Overall still having intrusive HI/SI without intent or plan chronically but maybe is a bit better than before hosp stay. Sleep is better.  Lost 40# on Wegovy.  Plan no change  06/22/23 appt noted: EEG shows some epileptiform activity with FU pending with another 4 hour EEG pending. Meds unchanged Overall mental health about the same or a tad bit better.  Seems he can distract himself from intrusive thoughts and they are less common than they were.   CC mouth watering from med.  Choking middle of the night.  Atropine helps some but has to be repeated and "pain in the ass".   Sleep 6 hours.  Sometimes taking trazodone 50.  Sleep is not that bad.  Psych med: clozapine reduced to 300 HS, , clonazepam 0.5 mg BID, lithium 150 HS, trazodone 50 prn rare.  Keppra 500 BID. Plan no changes  07/20/23 appt noted:  alone and with parents. Psych med: clozapine reduced to 300 HS, , clonazepam 0.5 mg BID, lithium 150 HS, trazodone 50 prn rare.  Keppra 500 BID. B12 oral.  EEG sched for 08/2023; appt 08/25/23 Maybe a little better  but still same intrusive thoughts.  They cause him to have somatic anxiety like palpitations.  "You know I don't want to do any of those thoughts".    Out of the blue.   SE still drooling.  Using drops some.  But not often. Criss Alvine therapist in [place].  Likes her.  She's good at teaching . Awakening 2-3 times night nocturia usually.  Back to sleep ok Parents think he is better with more interest. Seems less disturbed .  More outgoing and social and mother agrees. Switched lorazepam to clonazepam 0.5 mg BID seems to be working better and more calming and lasts longer.   Decided more even anxiety benefit with clonazepam vs lorazepam.  Anxiety less on clonazepam.  Psych med hx extensive including ECT and  Risperidone Maxed out risperidone 8 mg without benefit for intrusive thoughts  Zyprexa 30 Latuda 80 which caused akathisia,  Vraylar, Rexulti, aripiprazole 20 mg with akathisia,  Seroquel 1000 mg,  InVega, Geodon, Saphris with side effects, symbyax, Fanapt NR .  Caplyta SE and markedly worse. Clozapine started end of March lithium 1200 SE,  lamotrigine 300 mg, Depakote 2000 mg, Tegretol, Trileptal and several of these in combinations, gabapentin,  N-acetylcysteine, Nuedexta,     Belsomra with no response,  Lunesta no response, trazodone 200  mg,  Xanax, clonazepam, lorazepam less sedation. Buspirone NR Clonidine SE with 2 trials  Viibryd 40 mg for 3 months with diarrhea,  protriptyline with side effects,  Trintellix 20 mg,  Parnate 50 mg with no response,   Emsam 12 mg for 2 months,   imipramine,  venlafaxine,   bupropion was side effects,  Lexapro 20 mg, sertraline, paroxetine, Deplin, fluoxetine 80,  fluvoxamine 150 agitated; retrial ? Sz related.   Auvelity NR at one daily.  SE BID  methylphenidate 60 mg,  Vyvanse, Concerta, strattera, , modafinil,  Memantine worsening SI? Donepezil, complained of HI, Exelon 3 BID rivastigmine DT worsening SI  pramipexole,   amantadine ,  Patient prone to akathisia.  [facility]  HX ECT   Review of Systems:  Review of Systems  Constitutional:  Positive for appetite change and fatigue.  Cardiovascular:  Negative for chest pain and palpitations.  Gastrointestinal:  Positive for constipation.  Musculoskeletal:  Positive for back pain.  Neurological:  Positive for dizziness and tremors. Negative for weakness.       Fidgety  Psychiatric/Behavioral:  Positive for decreased concentration, dysphoric mood and suicidal ideas. Negative for agitation, behavioral problems, hallucinations, self-injury and sleep disturbance. The patient is nervous/anxious. The patient is not hyperactive.     Medications: I have reviewed the patient's current medications.  Current Outpatient Medications  Medication Sig Dispense Refill   acetaminophen (TYLENOL) 500 MG tablet Take 1,000 mg by mouth every 6 (six) hours as needed for moderate pain.     atropine 1 % ophthalmic solution Place 2 drops under the tongue 3 (three) times daily as needed. 2 mL 1   cholecalciferol (VITAMIN D3) 25 MCG (1000 UNIT) tablet Take 1,000 Units by mouth daily.     clonazePAM (KLONOPIN) 0.5 MG tablet Take 1 tablet (0.5 mg total) by mouth 2 (two) times daily. 60 tablet 1   esomeprazole (NEXIUM) 40 MG capsule Take 40 mg by mouth daily.     fenofibrate (TRICOR) 145 MG tablet Take 145 mg by mouth daily.     levETIRAcetam (KEPPRA) 500 MG tablet Take 1 tablet (500 mg total) by mouth 2 (two) times daily. 60 tablet 0   levothyroxine (SYNTHROID, LEVOTHROID) 75 MCG tablet Take 75 mcg by mouth daily before breakfast.     lithium carbonate 150 MG capsule Take 1 capsule (150 mg total) by mouth at bedtime. 90 capsule 0   lubiprostone (AMITIZA) 24 MCG capsule Take 24 mcg by mouth 2 (two) times daily with a meal.     metFORMIN (GLUCOPHAGE-XR) 500 MG 24 hr tablet Take 500 mg by mouth 2 (two) times daily with a meal.     tamsulosin (FLOMAX) 0.4 MG CAPS capsule  Take 1 capsule (0.4 mg total) by mouth daily. 90 capsule 3   traZODone (DESYREL) 50 MG tablet Take 1 tablet (50 mg total) by mouth at bedtime as needed for sleep. 90 tablet 0   cloZAPine (CLOZARIL) 100 MG tablet Take 3 tablets (300 mg total) by mouth at bedtime. 90 tablet 3   Cyanocobalamin (VITAMIN B-12 IJ) Inject as directed every 30 (thirty) days. (Patient not taking: Reported on 07/20/2023)     No current facility-administered medications for this visit.   Medication Side Effects: Other: mild sleepiness.   Occ twitches.\, tremor  Allergies:  Allergies  Allergen Reactions   Meloxicam Other (See Comments)    Dizziness   Zyprexa [Olanzapine] Other (See Comments)    Akathesia   Nsaids Other (See Comments)  Hallucinations   Ibuprofen Other (See Comments)    Can not take because taking lithium   Prednisone Other (See Comments)    Can't sleep    Wellbutrin [Bupropion] Other (See Comments)    Suicidal thoughts    Past Medical History:  Diagnosis Date   ADHD (attention deficit hyperactivity disorder)    Anemia    Anxiety    Bipolar disorder (HCC)    BPH (benign prostatic hyperplasia)    Chronic kidney disease, stage 3b (HCC)    Constipation    DDD (degenerative disc disease), cervical    Depression    Deviated septum    ED (erectile dysfunction)    GERD (gastroesophageal reflux disease)    Graves disease    History of hiatal hernia    Hypertension    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    Hypothyroidism    Pneumonia    PONV (postoperative nausea and vomiting)     Family History  Problem Relation Age of Onset   Prostate cancer Neg Hx    Bladder Cancer Neg Hx    Kidney cancer Neg Hx     Social History   Socioeconomic History   Marital status: Single    Spouse name: Not on file   Number of children: Not on file   Years of education: Not on file   Highest education level: Not on file  Occupational History   Not on file  Tobacco Use    Smoking status: Former    Current packs/day: 0.00    Types: Cigarettes    Quit date: 2010    Years since quitting: 15.2    Passive exposure: Past   Smokeless tobacco: Former    Types: Associate Professor status: Never Used  Substance and Sexual Activity   Alcohol use: Not Currently   Drug use: Never   Sexual activity: Yes  Other Topics Concern   Not on file  Social History Narrative   Not on file   Social Drivers of Health   Financial Resource Strain: Low Risk  (11/03/2022)   Received from [facility], [facility]   Overall Financial Resource Strain (CARDIA)    Difficulty of Paying Living Expenses: Not very hard  Food Insecurity: No Food Insecurity (04/17/2023)   Hunger Vital Sign    Worried About Running Out of Food in the Last Year: Never true    Ran Out of Food in the Last Year: Never true  Transportation Needs: No Transportation Needs (04/17/2023)   PRAPARE - Administrator, Civil Service (Medical): No    Lack of Transportation (Non-Medical): No  Physical Activity: Not on file  Stress: Stress Concern Present (08/12/2020)   Received from [facility], [facility] - Occupational Stress Questionnaire    Feeling of Stress : Very much  Social Connections: Unknown (09/01/2021)   Received from [facility], [facility]   Social Network    Social Network: Not on file  Intimate Partner Violence: Not At Risk (04/17/2023)   Humiliation, Afraid, Rape, and Kick questionnaire    Fear of Current or Ex-Partner: No    Emotionally Abused: No    Physically Abused: No    Sexually Abused: No    Past Medical History, Surgical history, Social history, and Family history were reviewed and updated as appropriate.   Please see review of systems for further details on the  patient's review from today.   Objective:   Physical Exam:  There were no vitals taken for this visit.  Physical  Exam Constitutional:      General: He is not in acute distress.    Appearance: He is well-developed.  Musculoskeletal:        General: No deformity.  Neurological:     Mental Status: He is alert and oriented to person, place, and time.     Cranial Nerves: No dysarthria.     Motor: Tremor present.     Coordination: Coordination normal.     Comments: mild tremor  Psychiatric:        Attention and Perception: Attention and perception normal. He does not perceive auditory or visual hallucinations.        Mood and Affect: Mood is anxious and depressed. Affect is not labile, blunt, angry or tearful.        Speech: Speech normal. Speech is not slurred.        Behavior: Behavior normal. Behavior is not slowed. Behavior is cooperative.        Thought Content: Thought content is not delusional. Thought content includes homicidal and suicidal ideation. Thought content does not include suicidal plan.        Cognition and Memory: Cognition normal. He exhibits impaired recent memory.        Judgment: Judgment normal.     Comments: Insight fair Chronically talkative .directable. But chronically mildly pressured but less so and calmer than usual. He is chronically anxious  .  No manic signs noted. Some wordfinding problems ongoing. Intrusive obsessions episodic HI/SI are the CC but is better since Dec Alert and oriented.      Lab Review:     Component Value Date/Time   NA 144 04/17/2023 0314   NA 135 10/20/2022 1123   NA 142 03/20/2014 0514   K 4.4 04/17/2023 0314   K 4.1 03/20/2014 0514   CL 109 04/17/2023 0314   CL 112 (H) 03/20/2014 0514   CO2 28 04/17/2023 0314   CO2 26 03/20/2014 0514   GLUCOSE 92 04/17/2023 0314   GLUCOSE 99 03/20/2014 0514   BUN 16 04/17/2023 0314   BUN 22 10/20/2022 1123   BUN 7 03/20/2014 0514   CREATININE 1.43 (H) 04/17/2023 0314   CREATININE 0.78 03/20/2014 0514   CALCIUM 8.7 (L) 04/17/2023 0314   CALCIUM 9.5 03/20/2014 0514   PROT 6.5 04/14/2023 1047    PROT 7.3 03/18/2014 1459   ALBUMIN 3.8 04/14/2023 1047   ALBUMIN 3.2 (L) 03/18/2014 1459   AST 21 04/14/2023 1047   AST 16 03/18/2014 1459   ALT 10 04/14/2023 1047   ALT 16 03/18/2014 1459   ALKPHOS 39 04/14/2023 1047   ALKPHOS 105 03/18/2014 1459   BILITOT 0.3 04/14/2023 1047   BILITOT 0.3 03/18/2014 1459   GFRNONAA 56 (L) 04/17/2023 0314   GFRNONAA >60 03/20/2014 0514   GFRAA 54 (L) 04/18/2019 1612   GFRAA >60 03/20/2014 0514       Component Value Date/Time   WBC 4.8 07/13/2023 1044   WBC 6.5 04/21/2023 0946   RBC 4.00 (L) 07/13/2023 1044   RBC 3.92 (L) 04/21/2023 0946   HGB 12.5 (L) 07/13/2023 1044   HCT 37.8 07/13/2023 1044   PLT 431 07/13/2023 1044   MCV 95 07/13/2023 1044   MCV 89 03/20/2014 0514   MCH 31.3 07/13/2023 1044   MCH 30.9 04/21/2023 0946   MCHC 33.1 07/13/2023 1044   MCHC 32.7  04/21/2023 0946   RDW 12.4 07/13/2023 1044   RDW 12.5 03/20/2014 0514   LYMPHSABS 2.2 07/13/2023 1044   LYMPHSABS 3.2 03/20/2014 0514   MONOABS 0.4 04/21/2023 0946   MONOABS 1.0 03/20/2014 0514   EOSABS 0.3 07/13/2023 1044   EOSABS 0.4 03/20/2014 0514   BASOSABS 0.1 07/13/2023 1044   BASOSABS 0.1 03/20/2014 0514    Lithium Lvl  Date Value Ref Range Status  04/14/2023 0.20 (L) 0.60 - 1.20 mmol/L Final    Comment:    Performed at [facility], [address] [place], [place] [address]   08/12/21 lithium level 0.9 on 450 mg.  lithium level Sept 0.8.   lithium level July 27, 2018 was normal at 1.0.   Lithium level [facility] October 03, 2018 = 1.2. Said he got lithium level at [facility] as requested.  Labs not in Epic.  Recent lipids ok except higher TG than usual.  Normal A1C.  11/10/22 Checked clozapine level on 400 mg HS= cloz 733+norclozapine 294 = total 1027  .res Assessment: Plan:    Daltin was seen today for follow-up, depression, anxiety and medication reaction.  Diagnoses and all orders for this visit:  Severe bipolar I disorder, current or most recent  episode depressed (HCC) -     CBC with Differential/Platelet -     cloZAPine (CLOZARIL) 100 MG tablet; Take 3 tablets (300 mg total) by mouth at bedtime.  GAD (generalized anxiety disorder)  Panic disorder with agoraphobia  Insomnia due to mental condition  Other obsessive-compulsive disorders  Mild cognitive impairment  Long term current use of clozapine -     CBC with Differential/Platelet  Sialorrhea  Low serum vitamin B12  Seizure (HCC)    60 min appt with pt  needs a lot of time   .   Seen alone and then with parents for additional info.  Pt is fair historian. Chronic TR bipolar mixed and chronic anxiety.  He usually has mixed bipolar symptoms which we have not been able to completely eliminate.  See long list of meds tried.   He is  chronically unstable .  Mood is relatively stable except for the severe intrusive, ego-dystonic persistent obsessive HI/SI without intent or plan. Chronic frequent ph calls between appts often about the same issue repeatedly most recently re: intrusive ego dystonic thoughts HI/SI.  More like obsessions than true HI/SI.  Fewer lately of  Multiple phone calls about ongoing intrusive, obsessive, ego dystonic HI/SI thoughts without plan or intent.  No recent mania.  Chronic dep and anxiety.  But is a little better since being on Keppra.   Psych hosp 08/02/22 with SI and acute kidney injury and elevated lithium. Processed this and disc goal of trying to stop lithium but risk of worsening SI.  He has chronic SI and there's risk of this worsening off lithium.  Lithium and clozapine are the most effective meds for SI.    Then recent hosp for SZ episodes.  Fluvox stopped and clozapine reduced to 300 mg HS. Records reviewed with pt and his brother.  Was RX risperidone but he stopped that after DC.  It was ineffective in at least 2 trials.  We discussed the short-term risks associated with benzodiazepines including sedation and increased fall risk among  others.  Discussed long-term side effect risk including dependence, potential withdrawal symptoms, and the potential eventual dose-related risk of dementia.  But recent studies from 2020 dispute this association between benzodiazepines and dementia risk. Newer studies in  2020 do not support an association with dementia.  Unfortunately due to the severity of set his symptoms polypharmacy is a necessity.    Lithium being used bc chronic SI and death thoughts.  Counseled patient regarding potential benefits, risks, and side effects of lithium to include potential risk of lithium affecting thyroid and renal function.  Discussed need for periodic lab monitoring to determine drug level and to assess for potential adverse effects.  Counseled patient regarding signs and symptoms of lithium toxicity and advised that they notify office immediately or seek urgent medical attention if experiencing these signs and symptoms.  Patient advised to contact office with any questions or concerns.  Reduced lithium to 150 mg daily Dt hx toxicity Checked lithium level and BMP and level 1.1 on 300 mg Daily so reduced again to 150 mg daily and level 0.4 Call if death thoughts worsen or worsening SI.  Disc clozapine and it's level..  Disc data showing less SI with it but he's had no changes.    Disc risk in detail including low WBC with complication, myocarditis.  Extensive discussion of CBC monitoring.  Disc going higher despite level and checking ECG bc cardiac risk at higher doses.   Disc this again bc taking Wegovy might help him to tolerate it better.   clozapine 600 mg HS was Started 12/07/22. Started clozapine 08/05/22. 11/10/22 Checked clozapine level on 400 mg HS= cloz 733+norclozapine 294 = total 1027 Reduced to 300 mg HS at the hospital. CBC every 2 weeks  Use the atropine drops more 2 on each side.  He's not doing this consistently.  Did he take duloxetine?  Might help depression more.  Consider retry pramipexole  off label   Switched lorazepam to clonazepam 0.5 mg BID seems to be working better and more calming and lasts longer. M administers  For [facility] had to stop rivastigmine DT worsening SI. Neuro [facility] Dr. Donia Pounds, [facility] in progress.   EEG on 06/03/23 bc recent sz   We discussed the short-term risks associated with benzodiazepines including sedation and increased fall risk among others.  Discussed long-term side effect risk including dependence, potential withdrawal symptoms, and the potential eventual dose-related risk of dementia.  But recent studies from 2020 dispute this association between benzodiazepines and dementia risk. Newer studies in 2020 do not support an association with dementia.  Discussed safety plan at length with patient.  Advised patient to contact office with any worsening signs and symptoms.  Instructed patient to go to the [facility] emergency room for evaluation if experiencing any acute safety concerns, to include suicidal intent.  He commits to safety. His HI/SI that he complains about are intrusive ego-dystonic obsessions and not true HI SI in that there's no desire or intent ever.  Disc Ozempic and Monjauro for weight loss.  Has had weight gain from meds as a contributor. Lost 45# with Wegovy and now 150#.  Needs to keep up activity as much as possible for depression.  For severe constipation seeing GI Will not make further changes to address this. Had constipation before clozapine and GI appt in June started Amitza which helped some. Needs to push fluids.    Historically he has not done well with serotonergic medicines which would typically be used for obsessive thoughts.  But we are running out of options.  Has not tried with clozapine. BC blood level of clozapine reluctant to increase unless we have to so ... / whether poss focal SZ contributing to psych px.?  Workup ongoing.  Explained plan.  Disc SE and will take weeks.  Will use low dose bc some SE when trie dlast  time.  Will reduce clozapine to reduce SE risk.  Needs 4 week trial before increasing.   Consider ECT for TR sx.  He's open to it but no decision today.  Disc memory concerns and mother's memory concerns bc had it before.  Disc counseling.  [email or web address] for therapist.  Most effective therapist will need to be expert in CBT.  He has one now.  No med changes  FU 4 weeks   Albert Staggers, Albert [name], Albert [name]  Future Appointments  Date Time Provider Department Center  08/18/2023  1:00 PM Cottle, Steva Ready., Albert [name] [facility] None  09/19/2023  1:00 PM Cottle, Steva Ready., Albert [name] [facility] None  10/24/2023  1:00 PM Cottle, Steva Ready., Albert [name] [facility] None  11/15/2023  2:00 PM Cottle, Steva Ready., Albert [name] [facility] None  12/29/2023  1:30 PM Sondra Come, Albert [name] [facility] None    Orders Placed This Encounter  Procedures   CBC with Differential/Platelet       -------------------------------

## 2023-07-27 ENCOUNTER — Other Ambulatory Visit: Payer: Self-pay | Admitting: Psychiatry

## 2023-07-28 LAB — CBC WITH DIFFERENTIAL/PLATELET
Basophils Absolute: 0.1 10*3/uL (ref 0.0–0.2)
Basos: 1 %
EOS (ABSOLUTE): 0.4 10*3/uL (ref 0.0–0.4)
Eos: 8 %
Hematocrit: 39 % (ref 37.5–51.0)
Hemoglobin: 13 g/dL (ref 13.0–17.7)
Immature Grans (Abs): 0 10*3/uL (ref 0.0–0.1)
Immature Granulocytes: 0 %
Lymphocytes Absolute: 2.1 10*3/uL (ref 0.7–3.1)
Lymphs: 48 %
MCH: 30.9 pg (ref 26.6–33.0)
MCHC: 33.3 g/dL (ref 31.5–35.7)
MCV: 93 fL (ref 79–97)
Monocytes Absolute: 0.3 10*3/uL (ref 0.1–0.9)
Monocytes: 7 %
Neutrophils Absolute: 1.6 10*3/uL (ref 1.4–7.0)
Neutrophils: 36 %
Platelets: 405 10*3/uL (ref 150–450)
RBC: 4.21 x10E6/uL (ref 4.14–5.80)
RDW: 12.4 % (ref 11.6–15.4)
WBC: 4.5 10*3/uL (ref 3.4–10.8)

## 2023-07-29 ENCOUNTER — Other Ambulatory Visit: Payer: Self-pay | Admitting: Psychiatry

## 2023-07-29 DIAGNOSIS — F4001 Agoraphobia with panic disorder: Secondary | ICD-10-CM

## 2023-07-29 DIAGNOSIS — F428 Other obsessive-compulsive disorder: Secondary | ICD-10-CM

## 2023-07-29 DIAGNOSIS — F314 Bipolar disorder, current episode depressed, severe, without psychotic features: Secondary | ICD-10-CM

## 2023-07-29 DIAGNOSIS — F411 Generalized anxiety disorder: Secondary | ICD-10-CM

## 2023-08-18 ENCOUNTER — Encounter: Payer: Self-pay | Admitting: Psychiatry

## 2023-08-18 ENCOUNTER — Telehealth: Payer: Medicare PPO | Admitting: Psychiatry

## 2023-08-18 DIAGNOSIS — Z79899 Other long term (current) drug therapy: Secondary | ICD-10-CM

## 2023-08-18 DIAGNOSIS — F411 Generalized anxiety disorder: Secondary | ICD-10-CM

## 2023-08-18 DIAGNOSIS — K5903 Drug induced constipation: Secondary | ICD-10-CM

## 2023-08-18 DIAGNOSIS — F314 Bipolar disorder, current episode depressed, severe, without psychotic features: Secondary | ICD-10-CM | POA: Diagnosis not present

## 2023-08-18 DIAGNOSIS — F5105 Insomnia due to other mental disorder: Secondary | ICD-10-CM

## 2023-08-18 DIAGNOSIS — F428 Other obsessive-compulsive disorder: Secondary | ICD-10-CM

## 2023-08-18 DIAGNOSIS — K117 Disturbances of salivary secretion: Secondary | ICD-10-CM

## 2023-08-18 DIAGNOSIS — F4001 Agoraphobia with panic disorder: Secondary | ICD-10-CM | POA: Diagnosis not present

## 2023-08-18 DIAGNOSIS — G3184 Mild cognitive impairment, so stated: Secondary | ICD-10-CM

## 2023-08-18 DIAGNOSIS — E538 Deficiency of other specified B group vitamins: Secondary | ICD-10-CM

## 2023-08-18 DIAGNOSIS — R569 Unspecified convulsions: Secondary | ICD-10-CM

## 2023-08-18 NOTE — Progress Notes (Signed)
 Albert [name] [id number] 1966/01/29 58 y.o.   Video Visit via My Chart  I connected with pt by video using My Chart and verified that I am speaking with the correct person using two identifiers.   I discussed the limitations, risks, security and privacy concerns of performing an evaluation and management service by My Chart  and the availability of in person appointments. I also discussed with the patient that there may be a patient responsible charge related to this service. The patient expressed understanding and agreed to proceed.  I discussed the assessment and treatment plan with the patient. The patient was provided an opportunity to ask questions and all were answered. The patient agreed with the plan and demonstrated an understanding of the instructions.   The patient was advised to call back or seek an in-person evaluation if the symptoms worsen or if the condition fails to improve as anticipated.  I provided 30 minutes of video time during this encounter.  The patient was located at home and the provider was located office. Session 100-130p  Subjective:   Patient ID:  Albert [name] is a 58 y.o. (DOB [birth year]) male.  Chief Complaint:  Chief Complaint  Patient presents with   Follow-up   Depression   Anxiety   Manic Behavior      Albert [name] presents to the office today for follow-up of mixed bipolar, anxiety and still grieving stress of breakup.  He requires frequent follow-up because of long-term symptoms.  At visit October 25, 2018.  We made several medicine changes because of his concerns about sleep and other issues.  We increased olanzapine back to 7.5 mg bc not sleeping as well at 5 mg.  He has to have the prescription for quetiapine written for up to 3 tablets at night in case insomnia is worse because insomnia makes his mood disorder so much worse.  We discussed that was above the usual max but the request was granted given his treatment resistant status. We also reduce  lithium from 900 mg daily to 750 mg daily to try to reduce tremor and muscle twitches.  At  visit March 28, 2019.  Fluoxetine was increased to 40 mg daily.   Early January 2021 increased to 60 mg daily.  No SE.  seen June 28, 2019.  No further meds were changed except olanzapine was increased back to 10 mg daily to see if if he could get additional benefit per his request.  Covid vaccinated.  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  Then had cholecystectomy.  April 2021 appt with the following noted: 2 episodes night sweats lately.  Memory has been very bad lately.  Repeatedly asks mother questions. Still  tend to stay in his room and his bed.  Still has rapid cycling mood swings.  Maybe some better with increase in olanzapine to 10 and tolerating itl.  Would like to get out more but can't DT Covid.  Can perform necessary chores.  Will get out of the house when he can.  No change in death thoughts and anxiety in intensity but is better with frequency.  Usually comes and goes in waves but more persistent.  Consistent with meds.  Ativan not helping anxiety very dramatically but he's not sure.  Fidgety.  No trigger other than still grieving relationship and can't get it out of his head.  Poor energy, concentration and more forgetful. In bed more and less active.  Sleep ok lately which is unusual.  Can concentrate on financial matters and stock market at times.  Tolerating meds.  Still some intrusive SI without reason. Less frequent obsessive thoughts about broken relationship and has been doing this for months.  Can't let it go.  Loop.   No unusual stress even with the family who is supportive. Likes the benefit that Zyprexa gives. No sleepwalking nor falling nor odd behavior.  No history of sleep walking.  He understands this may recur with an increase.  Also wants option to rarely take extra quetiapine 300 for sleep prn. Plan:  Try to reduce lorazepam if possible.  10/22/2019 appointment with the  following noted: Continues fluoxetine 60, lithium 600 mg, lorazepam 2 mg AM and HS and 1 mg midday, olanzapine 10, quetiapine 600 mg HS. Still anxious chronically and including driving in crowded spaces.  Doesn't thing Ativan helps as well as Xanax but less cognitive problems. Sleep is not as good.  Occ EFA.  More EMA and wanting to do things in the middle of the night but this is not typical.  Wonders about why that happens.  Some napping.   Tolerating meds.  Asks about weight loss meds.  Disc this in detail.   Plan no changes except OK meclizine prn vertigo.  02/11/20 appt with the following noted: Able to gradually reduce lorazepam to 1 mg AM and HS. More depressed over time and less interested in things and less interest in going out but does with his parents. Has reduced from 800 to 600 mg HS with some awakening but usually able to go back to sleep.Albert [name]  Spending a good amount of time in bed bc watches TV in bedroom.  Parents watch TV in different part of the house.  No mood swings he notices.   Concerns about weight gain about 200#. Likes olanzapine's benefit for sleep. Plan: Trial for TRD to  Increase fluoxetine to 80 mg daily  to use the combo with olanzapine for TR bipolar depression.   04/21/20 appt with following noted: I thought in beginning some benefit with fluoxetine.  Lifelong negative thinking continues.   Not much bipolar.  Reads a lot on bipolar.   Still tired and anxious a little more may be seasonal.  No familial stressors.  Tends to lay down in afternoon.  Anxiety not over anything in particular.   No SE with fluoxetine. Took meclizine prn.  Easily motion sick.    Asks questions about newer drugs for bipolar like Caplyta. Plan:  No med changes  06/30/20 appt noted: Tired of wearing masks.  Vaccinated.  Asked questions about when this will end with mask mandate.  Mood up and down some since here and getting up 2-3 times.  Disc awakening problems.  Trying to do better bc  night eating some.   Mood is about average today so far.  Albert [name] out with friends for breakfast.  Recognizes activity helps mood.   3 days in a row napped a lot in the afternoon.   Bed is a comfort place.  Gets bored and hard to motivate.  Tries to stay away from night eating and spending.   More depressed when alone and wonders about med changes. Plan: Failed response to olanzapine + fluoxetine for TR bipolar depression.   Reduce fluoxetine to 1 daily and reduce olanzapine to one half nightly for 10 days and then stop it. Then start Caplyta 1 daily  08/27/2020 appointment with the following noted: Patient decompensated with the above switch to Caplyta with intrusive  suicidal thoughts and had to be hospitalized for psychiatric reasons.  Multiple phone calls with family members since that time.  Spoke with the psychiatric nurse practitioner with the decision to restart olanzapine in place of the Caplyta given the patient was more stable while on the combination of Seroquel and olanzapine than with the Caplyta. Caplyta triggered HI/SI and depressed and confused on it, and agitated and still has some of it now. No akathisia. Frustrated with lack of therapy at the hospital.   Hydroxyzine didn't help jitteriness.  Feels some better.  Fleeting SI & HI but not obsessive like it was prior to hospitalization.  Feels a little amped up and nervous.  Scared of what could happen.  Apprehensive being here talking about things. 5-6 hours sleep last night and would like to have more. At mom and dad's house right now and up more in the day. Inadvertently stopped lorazepam abruptly by mistake likely contributing to shakiness. Still some memory issues.  Trying to stay out of bed when watching TV.  Some awakening but managing. Occ fleeting SI and distracts himself.   Always spends a lot of time just laying around.   Can have anxiety for no reason like coming here, and varies in intensity without pattern.      Less panic  than in the past.  Some chronic depression and hyperactivity and loudness and hyperverbal.  Usually back to sleep.  Total 6 hours but some napping, awakens 2-4 times nightly but back to sleep..  Taking quetiapine 600 mg HS.  still lacks interest and motivation.  Can follow a TV show if interested. Plan: Restart lorazepam 1 mg in the morning, 1 mg in the afternoon and 1 mg at night for anxiety Stop hydroxyzine  Increase olanzapine to 1 and 1/2 of 10 mg tablets in evening  09/05/2020 appt noted: seen with parents today at his request Made med changes noted and tolerated the changes Better than I did last week. Now only fleeting HI/SI and "no where near what it was".  Sleeping better.  Still not a lot of energy.  Anhedonia.  Less agitation.  A lot of anxiety all the time but it's helping.  Would like more energy and motivation.   Doesn't handle stress well. Parents note he's less shakey.  He's still staying with his parents.  Only driven once since this happened and F wants him to be able to drive and function more independently.  M agrees he's better.  Memory is better but not normal.  Primarily STM problems No akathisia with olanzapine this time.    Administering his own meds but mo watched. Slept 8 hours last night. Plan consider further reductions in quetiapine  10/17/20 appt noted: Stayed on Seroquel 600 HS and olanzapine 15. Olanzapine seemed to do most for the sleep.  Also helping eliminated HI/SI for the most part.  Wants to try to increase it.  Depression is much better with less rumination also.  Fears akathisia at higher dose as in the past. Also on fluoxetine 40.   Plan: Reduce Seroquel to 1 and 1/2 tablets at night Increase olanzapine to 1 of the 15 mg tablet and 1/2 of the 5 mg tablet for 1 week,  Then, if tolerated increase the olanzapine to 20 mg or 1 of the 15 mg tablets and 1 of the 5 mg tablets. If possible then also reduce quetiapine to 1 tablet at night.  11/05/2020 appointment  with the following noted: Up to olanzapine 20  mg hs for 3 days.  Reduced Serooquel to 450 mg HS.  Didn't try to go lower. Had hiatal hernia and umbilical hernia surgery recently with no problem.   No akathisia so far. Sleep ok with meds so far changes.  No mood changes yet but overall likes the smoothness of olanzapine and helping sleep without knociing him out. Still some occ HI/SI, he thinks bc of anxiety generally.  Easily anxious. Plan: Continue olanzapine 20 mg nightly longer to give it time to help mood sx reduce quetiapine to 1 tablet at night to minimize polypharmacy and reduce risk of akathisia.  01/07/21 appt noted: Able to reduce quetiapine 300 mg HS ok with decent sleep but still wakes a lot.  No AM hangover.  Don't have a lot to do.  Can enjoy going out and doing things.  But doesn't do it longterm.  Family is doing good.  Father started year 61 at his job.   Tolerating the meds well.  May wake more with quetiapine reduction. Lost 10#. Wonders if could try other meds he didn't tolerate before bc now can tolerate olanzapine and couldn't tolerate it in the past DT akathisia. Mood never great but can swing into depresssion without reason.   Plan: Continue olanzapine 20 mg nightly longer to give it time to help mood sx reduce quetiapine to 1/2 of the 300 mg  tablet at night to minimize polypharmacy and reduce risk of akathisia.  02/02/2021 appointment with the following noted: Tried reduced quetiapine to 150 mg and after a week had withdrawal sx including jittery and SI and he increased to 250 mg daily.  Felt better after increasing to 300 mg daily and last week reduced to 250 mg daily.  After increase quetiapine felt better in a few days.  Seems more alert with less quetiapine but can't tolerate a quick withdrawal.   Some awakening.  Plan: Reduce quetiapine by 50 mg every 2-4 weeks.  03/09/2021 phone call: Pt called and said that he is weaning off the seroquel. He was down to 200 mg of  the seoquel and he started having withdrawal symptoms and sucidal thoughts. He went back up to 250 mg because he knew he was good on that.    Pt stated he starting back taking 250 mg on Saturday due to the symptoms he was having.Now he is no longer having suicidal thoughts but still very jittery. Albert [name]: He may just need to taper slower than most people.  If he can tolerate the 250 mg Seroquel with the jitteriness it should get better over the next 1 to 2 weeks.  Then he can try going down again by 50 mg at a time.  He probably needs to go down by just 50 mg every 2 to 4 weeks and wait till he fully adjusts to each reduction before he reduces again.  03/17/21 appt noted: Hernia surgery 2 weeks ago. Seroquel 250 mg for a month then 200 mg daily and had SI and increased again to 300 mg daily.  Would love to get off it.  Withdrawal triggers intrusive HI/SI but otherwise doesn't have them No SE. Sleep is OK but not enough REM sleep.  Satisfied with other meds.   No current HI/SI.  No paranoia.  Still has anxiety and taking Ativan every 6 hours.  It works but doesn't cure it. Chronic anxiety and ask about ketamine for chronic depression. Plan: Because he had WD reducing from 250 to 200 mg daily will have to  go slowly. Reduce quetiapine by 25 mg every 2-4 weeks. Reduce Seroquel to 250 mg for 2 weeks, Then reduce to 225 mg for 2 weeks, Then reduce to 200 mg for 2 weeks, Then reduce to 175 mg for 2 weeks, Then reduce to 150 mg Also: Buspirone 30 mg tablets for anxiety Start 1/3 tablet twice daily for 1 week Then increase to two thirds twice daily for 1 week Then increase to 1 tablet twice daily  04/15/2021 appointment with the following noted: Reduced Seroquel to 200 mg HS and increased buspirone to 30 mg BID STM px and wants to try to reduce Ativan to see if it is better.  But fears with drawal sx. Parents complain about his memory. Less HI and SI since here. Less depressed. Plan: Buspirone 30 mg  tablets for anxiety Start 1/3 tablet twice daily for 1 week Then increase to two thirds twice daily for 1 week Then increase to 1 tablet twice daily Continue olanzapine 20 mg nightly longer to give it time to help mood sx Because he had WD reducing from 250 to 200 mg daily will have to go slowly. Reduce quetiapine by 25 mg every 2-4 weeks. Reduce Seroquel to 250 mg for 2 weeks, Then reduce to 225 mg for 2 weeks, Then reduce to 200 mg for 2 weeks, Then reduce to 175 mg for 2 weeks, Then reduce to 150 mg Trial taper lorazepam: In hopes of improving short-term memory  04/30/2022 phone call: Complaining of withdrawal symptoms reducing lorazepam from 0.5 mg 5 times a day to 0.5 mg 4 times daily.  05/21/2021 appointment with the following noted: Gotten onto buspar 30 BID and down on lorazepam to 0.5 mg QID. Gotten down to Seroquel 200 mg HS A little restless every now and then but able to reduce lorazepam for the last weeks. Is too inactive and lays in bed too much watching TV.   Can' t tell effect of buspirone bc of other changes.  Sleep with awakening with 6-7 hours. Fleeting SI without trigger or plan or intent. Depressed more than anxious. Enjoys watching stocks and investing. Plan: Trial taper lorazepam gradually over time In hopes of improving short-term memory But for now continue lorazepam 0.5 mg QID Start clonidine 0.1 mg tablets one half at night for 3 nights, then 1/2 tablet twice daily for 3 nights, then one half in the morning and 1 tablet at night If clonidine does not noticeably help anxiety call the office  05/27/2021 phone call initially complaining of homicidal and suicidal thoughts after taking 1 mg of clonidine.  He was told to stop the medication.  But then called back stating he was having homicidal and suicidal thoughts from withdrawal from the medication which was not logical.  He agreed it was not logical and stated on 05/29/2021 that he felt better  06/23/2021  appointment the following noted: No clonidine. Sunday panic attack eating out and then having SI.  Stayed with father to feel safer. Intrusive thoughts of hurting parents or others.  HI only when has SI.  Usually panic without SI.  Then had intrusive thoughts about hurting others.   Usually intrusive thoughts are brief and fleeting.    07/20/21  appt noted; More anxious with less lithium.  More anxious in his body.  Not more depressed or irritable.  Sleep is the same.  More racing thoughts.  Chronic SI probably a litte worse but not unmanageable. Rduced lithium last week to 300 mg daily. No recent  change in lisinopril. Off the clonidine for a week or so. Doesn't drink much water.  Drinks a lot of diet coke. No marked akathisia or RLS but does shake his leg DT anxiety. Worries about needing mor emedicine. Plan: Increase olanzapine to 25 mg nightly bc more racing thoughts and anxiety with less lithium DT severity of sx and difficulty getting off Seroquel conside rincrease.  07/30/21 TC : CO racing SI today and wonders about whether increased sx racing thoughts, hyperactivity, SI related to Reduced lithium from 600 mg daily to 300 mg daily recently DT lithium level 1.8.  could be related.    Plan: increase olanzapine 30 mg pm Today take lithium 600 mg now.  Tomorrow start lithium CR 450 mg daily Continue olanzapine 30 mg PM until SI resolves unless akathisia returns.  Albert Staggers, Albert [name], Albert [name]  08/17/21 appt noted:  No akathisia with olanzapine 30 mg pm (about 3 weeks) and is sleeping better. Increased lithium to 450 mg daily. Tolerating meds. Still racing negative thoughts maybe some better.  Asked about it. SI come and go.   Anxiety about the same as depression.  No motivation unless has to do something. Plan: Reduce  fluoxetine 20 mg 1 daily in hopes racing thoughts and anxiety are better.  09/14/2021 appointment with the following noted: No difference noted with reduction fluoxetine 20 mg  daily. Most of the time negative thoughts, ex "what if I get in a wreck?"  Frequent negative thoughts.   Still on lorazepam 0.5 mg TID.   Wonders about trying prior meds that caused akathisia bc no longer gets it with olanzapine 30 mg daily. Sleep is usually ok.  Taking Seroquel 200 mg HS.  Thinks the olanzapine helps his sleep and doesn't want to stop it. Panic 1-2 times per month.  Then takes extra lorazepam. Plan: DC fluoxetine 20 mg 1 daily  Trial Auvelity off label for depression 1 in the AM for 1 week then 1 in AM and 1 with evening meal  10/15/2021 appointment with the following noted: Occ of getting confused as to day and went to church on a Thursday my mistake.   Had it happen a few months ago too.  Memory is terrible. Thinks he couldn't tolerate more Auvelity. Not a lot of benefit from [place] 1 daily. Still quite a bit of anxiety.   A little drowsy  and sleeps 7-8 hours at night. Word finding problems.  Lost wallet. Plan: DC  Auvelity  For [facility]: memantine 1/2 tablet in the AM for 1 week, then 1/2 tablet twice daily, then 1/2 tablet in the AM and 1 tablet at night for 1 week then 1 tablet twice daily  10/22/2021 phone call complaining of additional stress asking to start new medication because of anxiety and more suicidal thoughts without intent or plan.  Reports taking more than 4 prescribed Ativan a day recently.  Asking to increase olanzapine which is already at 30 mg daily. Albert [name] response:We cannot go any higher in the dosage of olanzapine that he is currently taking.  A lot of these suicidal thoughts are apparently anxiety driven.  Let us have him resume taking paroxetine which is one of the more effective medicines for anxiety.  I will send it in and the instructions will be paroxetine 20 mg tablets 1/2 tablet daily for 1 week then 1 tablet daily  11/16/21 appt noted: Multiple phone calls since she was here.  Complaining of suicidal thoughts from memantine which was  stopped. Started  paroxetine and up to 20 mg daily.  Continues olanzapine 30 mg, lithium 450 mg nightly, lorazepam 0.5 mg 4 times daily , And quetiapine 200 mg nightly. Questions about whether memantine caused the neg SI really bc has them at times. When has SI often anxiety driven.  Will tend to ruminate on past relationship ended.   Edgy and irritable all the time but memantine seemed to make it worse. Mind is getting better with less SI lately.   No SE with paroxetine.   Aggrivated by forgetfulness.  eXample, talked with someone about going to breakfast last night and then forgot to go this mornin.g. Plan prescribed donepezil 5 mg daily for cognitive complaints specifically forgetfulness  12/31/2021 appointment with the following noted: He had been prescribed donepezil 5 mg for cognitive complaints.  He called back reporting he felt it was causing homicidal thoughts and was instructed to stop it.  He has had these types of thoughts in the past.  He had no desire to act on them. Discouraged. Ongoing depression and reduced enjoyment and negative thoughts.  Can enjoy some things. A lot of anxiety chronically.  Trying to limit Ativan to 5 of 0.5 mg daily.  Sometimes needs 2. Constipation for years.  02/03/22 appt noted: Depression makes LBP worse. Ongoing chronic depression. Tolerated rivastigmine without triggering SI Chronic anxiety and Ativan helps some. Occ panic but not usual.  Asks if any other meds coud be used. Plan: But for now continue lorazepam 0.5 mg QID, but use LED for cog reasons Increase rivastigmine to 3 mg capsule 1 twice daily or 2 of the 1.5 mg capsules twice daily.  Call if you have problems with nausea Start dextromethorphan 1 capsule twice daily . Thi sis off label for depression based on MOA of Auvelity and use of paroxetine to prolong half life of DM. Presence of paroxetine may have been reason he didn't tolerate Auvelity brief trial before.  Will proceed  slowly.  03/17/2022 appointment noted: Current psych meds: Rivastigmine 3 mg twice daily, quetiapine 200 mg nightly, paroxetine 40 mg daily, olanzapine 30 mg nightly, Namenda 10 mg daily, lorazepam 0.5 mg 4 times daily, lithium CR 450 mg nightly, dextromethorphan 15 mg twice daily A lot of anxiety since here.  Some days taken more lorazepam to 6-7 tablets daily.  No major triggers but anxious even about coming here and other normal things.  Cancelled trip to collect rocks with friend and cancelled the trip bc anxious.  Usually if can go he is ok.  Lots of SI ongoing Memory might be a little better. Asks how he responded to [employer].  Will check paper chart.  03/24/22 TC:  CO SI worse and restilless with Rivastigmine 9 mg . Albert [name] response:  Stop the rivastigmine since the [place] appears to be having side effects from it.  He was having anxiety at the last appointment 2 and then we increased the dose.  This might be causing the anxiety to be worse. He has chronic suicidal thoughts without intent or plan.  However it appears the thoughts are more intense with the rivastigmine     05/06/21 appt noted: Stopped rivastigmine DT anxiety and worsening SI.  Was better the next day. This week had some SI.  Thinking about the prior relationship tends to lead to SI. Will take Ativan when gets SI and it helps a little at 0.5 mg at a time. Plan: Consider fluvoxamine.  Yes this may help with obsessions on old GF and he  failed other optioons. Start fluvoxamine 1/2 of 50 mg tablet and reduce paroxetine to 1 and 1/2 of 20 mg tablets for 5 days, Then increase fluvoxamine to 1 tablet daily and reduce paroxetine to 1 tablet daily for 5 days, Then increase fluvoxamine to 1-1/2 tablets daily and reduce paroxetine to 1/2 tablet daily for 5 days, Then increase fluvoxamine to 2 tablets daily and stop paroxetine   06/07/22 appt noted: Feels like the fluvoxamine 50 BID is too much.  On this a couple of weeks. Worries he might be  worse with this with regard to SI.    He thinks it's worse with increase.   New neighbor who has a young boy. He worries that Albert [name] is triggering depression and intrusive SI/HI a couple of days later and lasts a little while. Has had anxiety turning into panic attacks.  This gets associated with depression and SI/HI.  These thoughts are intrusive and he doesn't want to act on them.  Can have HI even towards family with whom he has no negative emotions.  Little things like a bump up in parking lot sets off his depression and anxiety and doesn't seem to handle things.   No sig akathisia.  In the past it's been continuous.   Awakens every 2-3 hours.  Usually goes back to sleep with meds. Has not had alcohol in 20 years and had some intrusive thoughts without drinking.   Plan: As soon as he feels comfortable increase fluvoxamine to 50 mg AM and 100 mg PM for intrusive thoughts of HI/SI and obs on GF  07/06/22 appt noted: Increased fluvoxamine to 150 mg daily and felt more agitated and some SI so reduced to 100 mg 4 days ago and feels better so far.  Likes the fact it is good for anxiety and obs thoughts.  Wonders if there is a similar med.  Frustrated he can't get relief.  Some improvement in obs on exGF but not resolved. Continue other meds: lithium CR 450 mg HS, lorazepam 0.5 mg QID, Memantine 10 BID, olanzapine 30 mg HS, quetiapine 200 mg HS Tingling and burning in legs at night but doesn't interfere with sleep. Got insurance to pay for [facility] and lost 13#.   No full panic lately. But some anxiety waves.   Still major problems with memory. Plan: Wean fluvoxamine 50 mg daily for a month then 1/2 daily for 2 weeks then stop it DT  NR.  Watch for more anxiety.  07/28/22 TC: Albert [name], Albert [name]  to Me     07/28/22  4:16 PM Note Mom called reporting patient has been confused, dizzy, and sluggish for the last 3 days. He is now staying with her. She had several questions in regards to this:    Lithium level - she feels he needs one done, last result in Epic was 08/2021 that I see. She would like order sent to [facility] for tomorrow.   Patient is on Wegovy, has been for about a month, and she wonders if this could cause sx.   Patient has meclizine and she is questioning if she should give that for his dizziness considering other sx.   She questions if perhaps he got up during the night and took additional medications, though doesn't know if this happened and if so what he took.    On 3/5 visit he was to wean fluvoxamine to 50 mg qd for a month and then 1/2 tab for 2 weeks and then stop. Mom  and pt unsure if he started the wean.    He has F/U 4/4.        Me  to Albert [name], Albert [name]   CC   07/28/22  5:44 PM Note Yes get lithium level in the AM and don't take any lithium tonight or in the morning before the blood test.     Albert [name] should not cause these sx except if he is losing wt quickly or is dehydrated it can cause lithium toxicity and include these sx.  He needs to drink lots of water.   He should go ahead and wean the fluvoxamine as instructed in the last appt.   If the sx get worse, go to the ER.    Me   CC   07/29/22  6:49 PM Note RTC   Sx not as severe as when he had to go to hospital in the past.  Albert [name] got Lithium level today.   Speech is not good .  Confused at times, but better today.  A little tremor .  A little balance problems.  No falls.  No diarrhea Took lihtium 450 last night. Mo says he got up in the middle of the night and moved things around last night and he doesn't rmember.   No change in night time meds.   Gradually reducing fluvoxamine.   No extra lorazepam.     Spoke with mother who verifies the same.   No med changes but push fluids.   Albert Beat, Albert [name], Albert [name]      08/05/22 appt : with parents Pt hosp 08/02/22 with acute kidney injury and somewhat elevated lithium level with some confusion.  Spoke with psych NP at hosp and lithium was  decreased to 450 mg HS. No pending appt known with medical doctors about kidney function. Don't feel good and having HI and SI and feels he needs lithium for these issues.  Doesn't feel he should have been discharged.   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 No added meds at night but has been confused at night and getting up multiple times and doing things he doesn't remember.  This concerns parents.  Parents been up at night with him and he doesn't make sense.  Still groggy in the am per mo for an hour.   Plan: Plan: Get lithium level and kidney test ASAP Stop fluvoxamine Reduce lithium 300 mg capsule 1 at night Monday night: reduce to olanzapine to 20 mg at night (stop the 10 mg tablet) and start clozapine prescription. Monday 4/15 : increase clozapine to 100 mg at night and reduce quetiapine to 1/2 of 200 mg tablet and continue olanzapine 20 mg each evening.  08/01/22 & 08/11/22 ED for HI and SI  08/12/22 TC mother:  Mother, Albert [name] called. Per note made by Abundio Abts. He was referred to [facility]Reporting that he did go up there last night. This morning they called her and told her to come pick him up. They told her he wasn't having homicidal or suicidal thoughts and it would be four to five days of him waiting to go to a facility in [place]. They recommended him for PHP to start 4/16. She picked him up today. He is worried about these thoughts coming back up. Also, [facility] came out yesterday to assess him and offered counseling from [facility]. He wants to do counseling with them.  Any input on care he can receive?  He is so discouraged and wants to be safe.  (She notes  that the [facility] gave him- Zyprexa 10mg  at 1:07am and again at 1:25am per the med list- based on his previous med list) He was supposed to be off of Zyprexa10mg . Has been on  Clozapine since Monday. He did go ahead and take it before he left ( 2pills) so he hasn't missed a dose and did go get labwork done. Is wanting to continue the Clozapine, please  check the labwork to be able to fill the 100mg  dose.    Albert [name] resp:  Continue all meds as RX except go ahead and increase clozapine to 100 mg tablets, 1 each night as soon as he can get the 100 mg tablets from pharmacy.  RX sent.  Trying to speed up his chance of response by getting it to a hgiher dose.  Clozapine is very effective for HI/SI.  If he gets worsening HI/SI in the interim it will not be caused by meds but just the natural waxing and waining of his sx.  Once clozapine dose gets high enough it should help but avg patient needs 300 mg so we have to just be patient.      08/20/22 appt:  Up to 100 mg clozapine HS SE constipation and using Colace, Miralax.  Been to [facility] twice  since here.   Ongoing continuous HI/SI.   All I want to do is go to sleep to escape.  Dep causing the thoughts.   Taking Ativan 0.5 mg BID-QID.  He thinks mother not giving him enough of it.  He thinks anxiety triggers HI/SI.  He fears HI and hopes he's strong enough to reisist.  Not angry or wanting to hurt anyone in reality but has the intrusive thoughts. Conc and memory still not good.   Sleep not much different.   Staying with parents currently. Dep gotten worse M worrying he's dependent on lorazepam.   Confusion at night is no longer happening.  Not wandering at night.  Plan: For severe constipation start Linzess 145 mg AM Get lithium level and kidney test ASAP Stopped fluvoxamine and confusion at night is gone  lithium 300 mg capsule 1 at night  olanzapine to 20 mg at night  increase clozapine to 150 mg at night for 3 nights then 200 mg HS   olanzapine 20 mg each evening. Continue lorazepam 0.5 mg QID, needs this and informed mother  09/07/22 appt noted: Psych meds: clozapine 250 MG HS, lithium 300 mg HS, lorazepam 0.5 mg BID-QID, olanzapine 20 .  Off Seroquel, linzess 290 mg daily, Miralax, Colace. Sleep 7-8 hours without change from before clozapine.   Mood is still very depressed and still gets  anxiety. Still problems with constipation.   Dep is so much it is causing intrusive HI/SI.   No more bizarre behaviors at night.   Plan: Plan: Get lithium level and kidney test ASAP  lithium 300 mg capsule 1 at night  olanzapine to 20 mg at night  increase clozapine to  300 mg HS as soon as tolerated and then 400 mg HS.  olanzapine 20 mg each evening. Continue lorazepam 0.5 mg QID, needs this and informed mother  10/07/22 appt noted: alone and with parents Rough month.  Altered voice for a couple of weeks or so..  Mind not good.  Feel out of sorts.  Jittery.  Constipation.  No appetite.  On Wegovy.  Saw GI doc.   May need to reduce Wegovy bc poor appetite and constipation.  Lost 25 #. Current psych med: clozapine  400 mg HS about a week or 2, olanzapine 20 mg HS, lithium 300 mg HS, lorazepam 0.5 mg QID. Reduced intrusive thoughts.  2 nights since here night eating.   Sleep 6 hours.  Limited napping during the day.   Anhedonia.  Lower motivation.  Tremor worse. M says he sleeps pretty well but awakens  BP is under control. Plan Reduce lithium to 150 mg capsule 1 at night DT level higher Reduce  olanzapine to 10 mg at night  Continue clozapine 400 mg HS bc helping SI and HI and  Continue lorazepam 0.5 mg QID, needs this and informed mother  11/08/22 appt noted:  alone and with parents. Ongoing calls between visits Doesn't think clozapine helping him sleep as well as olanzapine for sleep or any other benefit. While at restaurant had intrusive HI/SI. Staying with parents seems to provoke intrusive HI.  Is grateful to them.  Sat woke up with them.  Sometimes goes away and sometimes not.  No know trigger and no intent. Saw PCP.  Off Wegovy.  Started metformin. No effect noted from clozapine.   M limits his lorazepam.  PCP referring him to neuro to FU abnormal head scan.  He wonders if his speech and memory problems are related.   Plan: Cont clozapine 400 mg HS Check clozapine level and  then direct dosing.  11/16/22 TC from pt to disc level:  Albert [name] resp:  Clozapine level ok but may benefit with increase to 500 mg nightly.  Tell him to increase with current supply.     12/06/22 TC from pt:  complaining of worsening intrusive ego-dystonic HI/SI without intent, desire or plan.   Albert [name] resp ok to increase clozapine to 600 mg HS.  12/09/22 appt noted: Complaining of ongoing racing thoughts and worsening HI/SI without intent or desire or plan.  Racing thoughts chronic and other thoughts wax and wane.  Usually triggered if starts thinking of something negative Meds: clozapine 600 mg HS, lithium 150, lorazepam 0.5 mg QID, olanzapine 10 HS Sleep with EFA.   Will have trouble going to sleep sometimes with thoughts. Doesn't think clozapine makes him sleepy.  Not really drowsy daytime. No dx OSA and no known apnea.  SE a little dizzy.  Constipation managed with BM every 2-3 days. Better than before.  PCP RX metformin and it helped. Plan: Plan for intrusive HI/SI wean olanzapine and trial of risperidone.  Reduce olanzapine to 5 mg HS for 1 week then stop it.  Addressed his fears of not sleeping off olanzapine. Then if sleeping start risperidone.  01/11/23 appt noted:  also seen with parents Psych meds: clozapine 600, lorazepam 0.5 mg QID, No olanzpaine ,  lithium 150 mg daily, no risperidone yet. Lost 45# on Wegovy down to 150#.   Still having intrusive HI/SI as noted before. Sleep no worse.  Takes a couple of hours after clozapine to sleep.  EFA 2-3 times nocturia.   Still racing thoughts.  Ongoing px with conc, memory,  planning Scored 73 on 100 pt test for cognition.   Some trouble with articulation and speech at times.   "If could get rid of racing and intrusive thoughts I'd be 100% better." Neuro says no stroke history and low B12 and workup ongoing at [facility], Albert [name]. Donia Pounds. Memory is still terrible.   Plan: Start risperidone 2 mg tablet, 1 at night for racing thoughts and intrusive  homicidal and suicidal thoughts. Call in 3 weeks if not better and we will increase  the dose  02/09/23 appt: seen also with parents , 60 min  Lost to 152#. Meds: as above, risperidone incr to 4.5 mg HS for intrusive HI/SI SE drooling off an on.  Tremor mainly worse with a little more risperidone HI/SI intrusive thoughts wax and wane.  Worse when lays down. Takes 2 hours to fall asleep.  Needs to get OOB when awakens.  6 hours sleep. No falls.   Would rather try to increase risperidone than other options.    04/12/23 appt virtual:  seen alone CC:  Multiple phone calls about ongoing intrusive, obsessive, ego dystonic HI/SI thoughts without plan or intent. Didn't come to the office bc of these thoughts.  I know I don't want to do it.  Can last four hours.  Sees drug advertisements that talk about SI and that will trigger obsessions about it. Meds: clozapine 600 mg HS,  lithium 150 mg HS, lorazepam 0.5 mg TID, risperidone increased to 8 mg and now back to 4mg  HS.   Neuro sched MRI tomorrow. Enough sleep 5-6 hours at night.  Not napping.   Sometimes gets confused with med but not ususally.   No recent mania.   Depressed and anxious. Plan: AVS: Reduce risperidone to 1/2 of 4 mg tablet in evening for 1 week then stop it. Reduce clozapine to 5 tablets nightly Start fluvoxamine 100 mg tablet 1/2 tablet nightly for 1 week then 1 tablet nightly for obsessive, intrusive thoughts  05/25/23 appt in person:  not with parents, met with [facility] with 2 sz and then had psych hosp since here.  Another EEG 06/03/23.  Neuro Albert [name].  Clelia [name].  RX Keppra.  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 Psych med: clozapine reduced to 300 HS, stopped ? Risperidone he's not sure, clonazepam 0.5 mg BID, lithium 150 HS, trazodone 50 prn rare.  Keppra 500 BID. Trazodone good sleep med. SE drooling again.   Not sure any mental health effect of Keppra.   Denies any street drugs despite positive UDS  for ecstasy. Overall still having intrusive HI/SI without intent or plan chronically but maybe is a bit better than before hosp stay. Sleep is better.  Lost 40# on Wegovy.  Plan no change  06/22/23 appt noted: EEG shows some epileptiform activity with FU pending with another 4 hour EEG pending. Meds unchanged Overall mental health about the same or a tad bit better.  Seems he can distract himself from intrusive thoughts and they are less common than they were.   CC mouth watering from med.  Choking middle of the night.  Atropine helps some but has to be repeated and "pain in the ass".   Sleep 6 hours.  Sometimes taking trazodone 50.  Sleep is not that bad.  Psych med: clozapine reduced to 300 HS, , clonazepam 0.5 mg BID, lithium 150 HS, trazodone 50 prn rare.  Keppra 500 BID. Plan no changes  07/20/23 appt noted:  alone and with parents. Psych med: clozapine reduced to 300 HS, , clonazepam 0.5 mg BID, lithium 150 HS, trazodone 50 prn rare.  Keppra 500 BID. B12 oral.  EEG sched for 08/2023; appt 08/25/23 Maybe a little better but still same intrusive thoughts.  They cause him to have somatic anxiety like palpitations.  "You know I don't want to do any of those thoughts".    Out of the blue.   SE still drooling.  Using drops some.  But not often. Criss Alvine therapist in  [place].  Likes her.  She's good at teaching . Awakening 2-3 times night nocturia usually.  Back to sleep ok Parents think he is better with more interest. Seems less disturbed .  More outgoing and social and mother agrees. Switched lorazepam to clonazepam 0.5 mg BID seems to be working better and more calming and lasts longer.  08/18/23 appt noted: Med: clozapine reduced to 300 HS, , clonazepam 0.5 mg BID, lithium 150 HS, trazodone 50 prn rare.  Keppra 500 BID. B12 oral.  Appt next week with Albert [name]. Bernetta Brilliant to review EEG. No med changes.  Still on above. SE drooling. No effect from atropine 2 drops . Mood not worse but not a  whole lot better Anxiety still better with clonazepam vs lorazepam. Sleep pretty good 5-6 hours and would like to sleep more.  Nocturia.   Not sedated from clozapine.  Rare naps. No other medical px except about EEG.   Low motivation bc doesn't feel like it.  So disgusted with lack of response with meds.   Does limited chores.  Has cleaning lady.  At parent's house off and on. Go through the motions.  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  Just lay around.    Decided more even anxiety benefit with clonazepam vs lorazepam.  Anxiety less on clonazepam.  Psych med hx extensive including ECT and  Risperidone Maxed out risperidone 8 mg without benefit for intrusive thoughts  Zyprexa 30 Latuda 80 which caused akathisia,  Vraylar, Rexulti, aripiprazole 20 mg with akathisia,  Seroquel 1000 mg,  InVega, Geodon, Saphris with side effects, symbyax, Fanapt NR .  Caplyta SE and markedly worse. Clozapine started end of March lithium 1200 SE,  lamotrigine 300 mg, Depakote 2000 mg, Tegretol, Trileptal and several of these in combinations, gabapentin,  N-acetylcysteine, Nuedexta,     Belsomra with no response,  Lunesta no response, trazodone 200 mg,  Xanax, clonazepam, lorazepam less sedation. Buspirone NR Clonidine SE with 2 trials  Viibryd 40 mg for 3 months with diarrhea,  protriptyline with side effects,  Trintellix 20 mg,  Parnate 50 mg with no response,   Emsam 12 mg for 2 months,   imipramine,  venlafaxine,   bupropion was side effects,  Lexapro 20 mg, sertraline, paroxetine, Deplin, fluoxetine 80,  fluvoxamine 150 agitated; retrial ? Sz related.   Auvelity NR at one daily.  SE BID  methylphenidate 60 mg,  Vyvanse, Concerta, strattera, , modafinil,  Memantine worsening SI? Donepezil, complained of HI, Exelon 3 BID rivastigmine DT worsening SI  pramipexole,  amantadine ,  Patient prone to akathisia.  [facility]  HX ECT   Review of Systems:  Review of Systems   Constitutional:  Positive for appetite change and fatigue.  Cardiovascular:  Negative for chest pain and palpitations.  Gastrointestinal:  Positive for constipation.  Musculoskeletal:  Positive for back pain.  Neurological:  Positive for dizziness and tremors. Negative for weakness.       Fidgety  Psychiatric/Behavioral:  Positive for decreased concentration, dysphoric mood and suicidal ideas. Negative for agitation, behavioral problems, hallucinations, self-injury and sleep disturbance. The patient is nervous/anxious. The patient is not hyperactive.     Medications: I have reviewed the patient's current medications.  Current Outpatient Medications  Medication Sig Dispense Refill   acetaminophen (TYLENOL) 500 MG tablet Take 1,000 mg by mouth every 6 (six) hours as needed for moderate pain.     atropine 1 % ophthalmic solution Place 2 drops under the tongue 3 (three) times  daily as needed. 2 mL 1   cholecalciferol (VITAMIN D3) 25 MCG (1000 UNIT) tablet Take 1,000 Units by mouth daily.     clonazePAM (KLONOPIN) 0.5 MG tablet TAKE ONE TABLET BY MOUTH TWICE DAILY 60 tablet 0   cloZAPine (CLOZARIL) 100 MG tablet Take 3 tablets (300 mg total) by mouth at bedtime. 90 tablet 3   Cyanocobalamin (VITAMIN B-12 IJ) Inject as directed every 30 (thirty) days.     esomeprazole (NEXIUM) 40 MG capsule Take 40 mg by mouth daily.     fenofibrate (TRICOR) 145 MG tablet Take 145 mg by mouth daily.     levETIRAcetam (KEPPRA) 500 MG tablet Take 1 tablet (500 mg total) by mouth 2 (two) times daily. 60 tablet 0   levothyroxine (SYNTHROID, LEVOTHROID) 75 MCG tablet Take 75 mcg by mouth daily before breakfast.     lithium carbonate 150 MG capsule Take 1 capsule (150 mg total) by mouth at bedtime. 90 capsule 0   lubiprostone (AMITIZA) 24 MCG capsule Take 24 mcg by mouth 2 (two) times daily with a meal.     metFORMIN (GLUCOPHAGE-XR) 500 MG 24 hr tablet Take 500 mg by mouth 2 (two) times daily with a meal.      tamsulosin (FLOMAX) 0.4 MG CAPS capsule Take 1 capsule (0.4 mg total) by mouth daily. 90 capsule 3   traZODone (DESYREL) 50 MG tablet Take 1 tablet (50 mg total) by mouth at bedtime as needed for sleep. 90 tablet 0   No current facility-administered medications for this visit.   Medication Side Effects: Other: mild sleepiness.   Occ twitches.\, tremor  Allergies:  Allergies  Allergen Reactions   Meloxicam Other (See Comments)    Dizziness   Zyprexa [Olanzapine] Other (See Comments)    Akathesia   Nsaids Other (See Comments)    Hallucinations   Ibuprofen Other (See Comments)    Can not take because taking lithium   Prednisone Other (See Comments)    Can't sleep    Wellbutrin [Bupropion] Other (See Comments)    Suicidal thoughts    Past Medical History:  Diagnosis Date   ADHD (attention deficit hyperactivity disorder)    Anemia    Anxiety    Bipolar disorder (HCC)    BPH (benign prostatic hyperplasia)    Chronic kidney disease, stage 3b (HCC)    Constipation    DDD (degenerative disc disease), cervical    Depression    Deviated septum    ED (erectile dysfunction)    GERD (gastroesophageal reflux disease)    Graves disease    History of hiatal hernia    Hypertension    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    Hypothyroidism    Pneumonia    PONV (postoperative nausea and vomiting)     Family History  Problem Relation Age of Onset   Prostate cancer Neg Hx    Bladder Cancer Neg Hx    Kidney cancer Neg Hx     Social History   Socioeconomic History   Marital status: Single    Spouse name: Not on file   Number of children: Not on file   Years of education: Not on file   Highest education level: Not on file  Occupational History   Not on file  Tobacco Use   Smoking status: Former    Current packs/day: 0.00    Types: Cigarettes    Quit date: 2010    Years since quitting: 15.3  Passive exposure: Past   Smokeless tobacco: Former     Types: Engineer, drilling   Vaping status: Never Used  Substance and Sexual Activity   Alcohol use: Not Currently   Drug use: Never   Sexual activity: Yes  Other Topics Concern   Not on file  Social History Narrative   Not on file   Social Drivers of Health   Financial Resource Strain: Low Risk  (11/03/2022)   Received from [facility], [facility]   Overall Financial Resource Strain (CARDIA)    Difficulty of Paying Living Expenses: Not very hard  Food Insecurity: No Food Insecurity (04/17/2023)   Hunger Vital Sign    Worried About Running Out of Food in the Last Year: Never true    Ran Out of Food in the Last Year: Never true  Transportation Needs: No Transportation Needs (04/17/2023)   PRAPARE - Administrator, Civil Service (Medical): No    Lack of Transportation (Non-Medical): No  Physical Activity: Not on file  Stress: Stress Concern Present (08/12/2020)   Received from [facility], [facility] - Occupational Stress Questionnaire    Feeling of Stress : Very much  Social Connections: Unknown (09/01/2021)   [facility] from [facility]   Social Network    Social Network: Not on file  Intimate Partner Violence: Not At Risk (04/17/2023)   Humiliation, Afraid, Rape, and Kick questionnaire    Fear of Current or Ex-Partner: No    Emotionally Abused: No    Physically Abused: No    Sexually Abused: No    Past Medical History, Surgical history, Social history, and Family history were reviewed and updated as appropriate.   Please see review of systems for further details on the patient's review from today.   Objective:   Physical Exam:  There were no vitals taken for this visit.  Physical Exam Constitutional:      General: He is not in acute distress.    Appearance: He is well-developed.  Musculoskeletal:        General: No deformity.  Neurological:     Mental  Status: He is alert and oriented to person, place, and time.     Cranial Nerves: No dysarthria.     Motor: Tremor present.     Coordination: Coordination normal.     Comments: mild tremor  Psychiatric:        Attention and Perception: Attention and perception normal. He does not perceive auditory or visual hallucinations.        Mood and Affect: Mood is anxious and depressed. Affect is not labile, blunt, angry or tearful.        Speech: Speech normal. Speech is not slurred.        Behavior: Behavior normal. Behavior is not slowed. Behavior is cooperative.        Thought Content: Thought content is not delusional. Thought content includes homicidal and suicidal ideation. Thought content does not include suicidal plan.        Cognition and Memory: Cognition normal. He exhibits impaired recent memory.        Judgment: Judgment normal.     Comments: Insight fair Chronically talkative .directable. But chronically mildly pressured but less so and calmer than usual. He is chronically anxious  .  No manic signs noted. anhedonia Intrusive obsessions episodic HI/SI are the CC but is better since Dec Alert and oriented.  Lab Review:     Component Value Date/Time   NA 144 04/17/2023 0314   NA 135 10/20/2022 1123   NA 142 03/20/2014 0514   K 4.4 04/17/2023 0314   K 4.1 03/20/2014 0514   CL 109 04/17/2023 0314   CL 112 (H) 03/20/2014 0514   CO2 28 04/17/2023 0314   CO2 26 03/20/2014 0514   GLUCOSE 92 04/17/2023 0314   GLUCOSE 99 03/20/2014 0514   BUN 16 04/17/2023 0314   BUN 22 10/20/2022 1123   BUN 7 03/20/2014 0514   CREATININE 1.43 (H) 04/17/2023 0314   CREATININE 0.78 03/20/2014 0514   CALCIUM 8.7 (L) 04/17/2023 0314   CALCIUM 9.5 03/20/2014 0514   PROT 6.5 04/14/2023 1047   PROT 7.3 03/18/2014 1459   ALBUMIN 3.8 04/14/2023 1047   ALBUMIN 3.2 (L) 03/18/2014 1459   AST 21 04/14/2023 1047   AST 16 03/18/2014 1459   ALT 10 04/14/2023 1047   ALT 16 03/18/2014 1459   ALKPHOS  39 04/14/2023 1047   ALKPHOS 105 03/18/2014 1459   BILITOT 0.3 04/14/2023 1047   BILITOT 0.3 03/18/2014 1459   GFRNONAA 56 (L) 04/17/2023 0314   GFRNONAA >60 03/20/2014 0514   GFRAA 54 (L) 04/18/2019 1612   GFRAA >60 03/20/2014 0514       Component Value Date/Time   WBC 4.5 07/27/2023 1052   WBC 6.5 04/21/2023 0946   RBC 4.21 07/27/2023 1052   RBC 3.92 (L) 04/21/2023 0946   HGB 13.0 07/27/2023 1052   HCT 39.0 07/27/2023 1052   PLT 405 07/27/2023 1052   MCV 93 07/27/2023 1052   MCV 89 03/20/2014 0514   MCH 30.9 07/27/2023 1052   MCH 30.9 04/21/2023 0946   MCHC 33.3 07/27/2023 1052   MCHC 32.7 04/21/2023 0946   RDW 12.4 07/27/2023 1052   RDW 12.5 03/20/2014 0514   LYMPHSABS 2.1 07/27/2023 1052   LYMPHSABS 3.2 03/20/2014 0514   MONOABS 0.4 04/21/2023 0946   MONOABS 1.0 03/20/2014 0514   EOSABS 0.4 07/27/2023 1052   EOSABS 0.4 03/20/2014 0514   BASOSABS 0.1 07/27/2023 1052   BASOSABS 0.1 03/20/2014 0514    Lithium Lvl  Date Value Ref Range Status  04/14/2023 0.20 (L) 0.60 - 1.20 mmol/L Final    Comment:    Performed at [facility], [address], [place], [place] [address]   08/12/21 lithium level 0.9 on 450 mg.  lithium level Sept 0.8.   lithium level July 27, 2018 was normal at 1.0.   Lithium level [facility] October 03, 2018 = 1.2. Said he got lithium level at [facility] as requested.  Labs not in Epic.  Recent lipids ok except higher TG than usual.  Normal A1C.  11/10/22 Checked clozapine level on 400 mg HS= cloz 733+norclozapine 294 = total 1027  .res Assessment: Plan:    Kass was seen today for follow-up, depression, anxiety and manic behavior.  Diagnoses and all orders for this visit:  Severe bipolar I disorder, current or most recent episode depressed (HCC)  GAD (generalized anxiety disorder)  Panic disorder with agoraphobia  Insomnia due to mental condition  Other obsessive-compulsive disorders  Mild cognitive impairment  Long term current  use of clozapine  Sialorrhea  Low serum vitamin B12  Seizure (HCC)  Drug-induced constipation   60 min appt with pt  needs a lot of time   .   Seen alone and then with parents for additional info.  Pt is fair historian. 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  He usually has mixed bipolar symptoms which we have not been able to completely eliminate.  See long list of meds tried.   He is  chronically unstable .  Mood is relatively stable except for the severe intrusive, ego-dystonic persistent obsessive HI/SI without intent or plan. Chronic frequent ph calls between appts often about the same issue repeatedly most recently re: intrusive ego dystonic thoughts HI/SI.  More like obsessions than true HI/SI.  Fewer lately of  Multiple phone calls about ongoing intrusive, obsessive, ego dystonic HI/SI thoughts without plan or intent.  No recent mania.  Chronic dep and anxiety.  But is a little better since being on Keppra.   Psych hosp 08/02/22 with SI and acute kidney injury and elevated lithium. Processed this and disc goal of trying to stop lithium but risk of worsening SI.  He has chronic SI and there's risk of this worsening off lithium.  Lithium and clozapine are the most effective meds for SI.    Then recent hosp for SZ episodes.  Fluvox stopped and clozapine reduced to 300 mg HS. Records reviewed with pt and his brother.  Was RX risperidone but he stopped that after DC.  It was ineffective in at least 2 trials.  We discussed the short-term risks associated with benzodiazepines including sedation and increased fall risk among others.  Discussed long-term side effect risk including dependence, potential withdrawal symptoms, and the potential eventual dose-related risk of dementia.  But recent studies from 2020 dispute this association between benzodiazepines and dementia risk. Newer studies in 2020 do not support an association with dementia.  Unfortunately due to the severity of set his  symptoms polypharmacy is a necessity.    Lithium being used bc chronic SI and death thoughts.  Counseled patient regarding potential benefits, risks, and side effects of lithium to include potential risk of lithium affecting thyroid and renal function.  Discussed need for periodic lab monitoring to determine drug level and to assess for potential adverse effects.  Counseled patient regarding signs and symptoms of lithium toxicity and advised that they notify office immediately or seek urgent medical attention if experiencing these signs and symptoms.  Patient advised to contact office with any questions or concerns.  Reduced lithium to 150 mg daily Dt hx toxicity Checked lithium level and BMP and level 1.1 on 300 mg Daily so reduced again to 150 mg daily and level 0.4 Call if death thoughts worsen or worsening SI.  Disc clozapine and it's level..  Disc data showing less SI with it but he's had no changes.    Disc risk in detail including low WBC with complication, myocarditis.  Extensive discussion of CBC monitoring.  Disc going higher despite level and checking ECG bc cardiac risk at higher doses.   Disc this again bc taking Wegovy might help him to tolerate it better.   clozapine 600 mg HS was Started 12/07/22. Started clozapine 08/05/22. 11/10/22 Checked clozapine level on 400 mg HS= cloz 733+norclozapine 294 = total 1027 Reduced to 300 mg HS at the hospital. CBC every 4 weeks, now that on it over a year.   Use the atropine drops more 2-4 on each side.  He's not doing this consistently.  Did he take duloxetine?  Might help depression more.  Consider retry pramipexole off label  Consider Qelbree or high dose lamotrigine.    Switched lorazepam to clonazepam 0.5 mg BID seems to be working better and more calming and lasts longer.  M administers  For [facility] had to stop rivastigmine DT worsening SI. Neuro [name]. Ernestine Heads, [facility] in progress.   EEG on 06/03/23 bc recent sz Advise him to discuss any psych  effects of this abnormality.   We discussed the short-term risks associated with benzodiazepines including sedation and increased fall risk among others.  Discussed long-term side effect risk including dependence, potential withdrawal symptoms, and the potential eventual dose-related risk of dementia.  But recent studies from 2020 dispute this association between benzodiazepines and dementia risk. Newer studies in 2020 do not support an association with dementia.  Discussed safety plan at length with patient.  Advised patient to contact office with any worsening signs and symptoms.  Instructed patient to go to the [facility] emergency room for evaluation if experiencing any acute safety concerns, to include suicidal intent.  He commits to safety. His HI/SI that he complains about are intrusive ego-dystonic obsessions and not true HI SI in that there's no desire or intent ever.  Disc Ozempic and Monjauro for weight loss.  Has had weight gain from meds as a contributor. Lost 45# with Wegovy and now 150#.  Off it a couple of mos.  Gained 15 # off it.    Needs to keep up activity as much as possible for depression.  For severe constipation seeing GI Will not make further changes to address this. Had constipation before clozapine and GI appt in June started Amitza which helped some. Needs to push fluids.    Historically he has not done well with serotonergic medicines which would typically be used for obsessive thoughts.  But we are running out of options.  Has not tried with clozapine. BC blood level of clozapine reluctant to increase unless we have to so ... / whether poss focal SZ contributing to psych px.?  Workup ongoing.  Explained plan.  Disc SE and will take weeks.  Will use low dose bc some SE when trie dlast time.  Will reduce clozapine to reduce SE risk.  Needs 4 week trial before increasing.   Consider ECT for TR sx.  He's open to it but no decision today.  Disc memory concerns and  mother's memory concerns bc had it before.  Disc counseling.  [email or web address] for therapist.  Most effective therapist will need to be expert in CBT.  He has one now.  No med changes.  Will wait to make med changes until after EEG work up completed.    FU 4 weeks   Albert Beat, [name], [name]  Future Appointments  Date Time Provider Department Center  09/19/2023  1:00 PM Cottle, Kennedy Peabody., [name] None  10/24/2023  1:00 PM Cottle, Kennedy Peabody., [name] None  11/15/2023  2:00 PM Cottle, Kennedy Peabody., [name] None  12/29/2023  1:30 PM Lawerence Pressman, [name] None    No orders of the defined types were placed in this encounter.      -------------------------------

## 2023-08-18 NOTE — Patient Instructions (Signed)
 Ask Dr. Mason Sole if EEG abnormality could be causing any psychiatric symptoms.

## 2023-08-23 ENCOUNTER — Ambulatory Visit: Payer: Medicare PPO | Admitting: Psychiatry

## 2023-08-24 ENCOUNTER — Ambulatory Visit: Payer: Medicare PPO | Admitting: Psychiatry

## 2023-08-24 ENCOUNTER — Telehealth: Payer: Self-pay | Admitting: Psychiatry

## 2023-08-24 NOTE — Telephone Encounter (Signed)
 Pt Lvm @ 3:51p stating that he needs a standing order for labs for the next 6 months.  They say he doesn't have one.  Next appt 5/19

## 2023-08-25 ENCOUNTER — Other Ambulatory Visit: Payer: Self-pay | Admitting: Psychiatry

## 2023-08-25 ENCOUNTER — Other Ambulatory Visit: Payer: Self-pay

## 2023-08-25 DIAGNOSIS — Z79899 Other long term (current) drug therapy: Secondary | ICD-10-CM

## 2023-08-25 DIAGNOSIS — F314 Bipolar disorder, current episode depressed, severe, without psychotic features: Secondary | ICD-10-CM

## 2023-08-25 LAB — CBC WITH DIFFERENTIAL/PLATELET
Basophils Absolute: 0.1 10*3/uL (ref 0.0–0.2)
Basos: 1 %
EOS (ABSOLUTE): 0.3 10*3/uL (ref 0.0–0.4)
Eos: 6 %
Hematocrit: 37.5 % (ref 37.5–51.0)
Hemoglobin: 12.7 g/dL — ABNORMAL LOW (ref 13.0–17.7)
Immature Grans (Abs): 0 10*3/uL (ref 0.0–0.1)
Immature Granulocytes: 0 %
Lymphocytes Absolute: 2.3 10*3/uL (ref 0.7–3.1)
Lymphs: 47 %
MCH: 30.5 pg (ref 26.6–33.0)
MCHC: 33.9 g/dL (ref 31.5–35.7)
MCV: 90 fL (ref 79–97)
Monocytes Absolute: 0.4 10*3/uL (ref 0.1–0.9)
Monocytes: 8 %
Neutrophils Absolute: 1.8 10*3/uL (ref 1.4–7.0)
Neutrophils: 38 %
Platelets: 404 10*3/uL (ref 150–450)
RBC: 4.17 x10E6/uL (ref 4.14–5.80)
RDW: 11.6 % (ref 11.6–15.4)
WBC: 4.8 10*3/uL (ref 3.4–10.8)

## 2023-08-25 NOTE — Telephone Encounter (Signed)
Lab order entered and faxed.

## 2023-08-26 ENCOUNTER — Telehealth: Payer: Self-pay | Admitting: Psychiatry

## 2023-08-26 NOTE — Telephone Encounter (Signed)
 Patient is getting labs at [facility], not [facility]. It was documented yesterday that order was sent to [facility] and faxed. Did not send orders to patient because he shouldn't need them, but will mail him a copy.

## 2023-08-26 NOTE — Telephone Encounter (Signed)
 Pt called 9:26 am checking status on lab orders to [facility] and mailed signed copy to pt for standing orders. Please advise.

## 2023-09-01 ENCOUNTER — Other Ambulatory Visit: Payer: Self-pay | Admitting: Psychiatry

## 2023-09-01 DIAGNOSIS — F314 Bipolar disorder, current episode depressed, severe, without psychotic features: Secondary | ICD-10-CM

## 2023-09-01 DIAGNOSIS — F411 Generalized anxiety disorder: Secondary | ICD-10-CM

## 2023-09-01 DIAGNOSIS — F428 Other obsessive-compulsive disorder: Secondary | ICD-10-CM

## 2023-09-01 DIAGNOSIS — F4001 Agoraphobia with panic disorder: Secondary | ICD-10-CM

## 2023-09-19 ENCOUNTER — Ambulatory Visit: Payer: Medicare PPO | Admitting: Psychiatry

## 2023-09-19 ENCOUNTER — Encounter: Payer: Self-pay | Admitting: Psychiatry

## 2023-09-19 DIAGNOSIS — G3184 Mild cognitive impairment, so stated: Secondary | ICD-10-CM

## 2023-09-19 DIAGNOSIS — F5105 Insomnia due to other mental disorder: Secondary | ICD-10-CM

## 2023-09-19 DIAGNOSIS — F411 Generalized anxiety disorder: Secondary | ICD-10-CM

## 2023-09-19 DIAGNOSIS — E538 Deficiency of other specified B group vitamins: Secondary | ICD-10-CM

## 2023-09-19 DIAGNOSIS — F4001 Agoraphobia with panic disorder: Secondary | ICD-10-CM | POA: Diagnosis not present

## 2023-09-19 DIAGNOSIS — F314 Bipolar disorder, current episode depressed, severe, without psychotic features: Secondary | ICD-10-CM

## 2023-09-19 DIAGNOSIS — R569 Unspecified convulsions: Secondary | ICD-10-CM

## 2023-09-19 DIAGNOSIS — K117 Disturbances of salivary secretion: Secondary | ICD-10-CM

## 2023-09-19 DIAGNOSIS — F428 Other obsessive-compulsive disorder: Secondary | ICD-10-CM

## 2023-09-19 DIAGNOSIS — Z79899 Other long term (current) drug therapy: Secondary | ICD-10-CM

## 2023-09-19 NOTE — Progress Notes (Signed)
 Albert [name] [id number] 02/28/1966 58 y.o.    Subjective:   Patient ID:  Albert [name] is a 58 y.o. (DOB 04/27/1966) male.  Chief Complaint:  Chief Complaint  Patient presents with   Follow-up   Depression   Anxiety      Albert [name] presents to the office today for follow-up of mixed bipolar, anxiety and still grieving stress of breakup.  He requires frequent follow-up because of long-term symptoms.  At visit October 25, 2018.  We made several medicine changes because of his concerns about sleep and other issues.  We increased olanzapine  back to 7.5 mg bc not sleeping as well at 5 mg.  He has to have the prescription for quetiapine  written for up to 3 tablets at night in case insomnia is worse because insomnia makes his mood disorder so much worse.  We discussed that was above the usual max but the request was granted given his treatment resistant status. We also reduce lithium  from 900 mg daily to 750 mg daily to try to reduce tremor and muscle twitches.  At  visit March 28, 2019.  Fluoxetine  was increased to 40 mg daily.   Early January 2021 increased to 60 mg daily.  No SE.  seen June 28, 2019.  No further meds were changed except olanzapine  was increased back to 10 mg daily to see if if he could get additional benefit per his request.  Covid vaccinated.  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  Then had cholecystectomy.  April 2021 appt with the following noted: 2 episodes night sweats lately.  Memory has been very bad lately.  Repeatedly asks mother questions. Still  tend to stay in his room and his bed.  Still has rapid cycling mood swings.  Maybe some better with increase in olanzapine  to 10 and tolerating itl.  Would like to get out more but can't DT Covid.  Can perform necessary chores.  Will get out of the house when he can.  No change in death thoughts and anxiety in intensity but is better with frequency.  Usually comes and goes in waves but more persistent.  Consistent with meds.   Ativan  not helping anxiety very dramatically but he's not sure.  Fidgety.  No trigger other than still grieving relationship and can't get it out of his head.  Poor energy, concentration and more forgetful. In bed more and less active.  Sleep ok lately which is unusual.  Can concentrate on financial matters and stock market at times.  Tolerating meds.  Still some intrusive SI without reason. Less frequent obsessive thoughts about broken relationship and has been doing this for months.  Can't let it go.  Loop.   No unusual stress even with the family who is supportive. Likes the benefit that Zyprexa  gives. No sleepwalking nor falling nor odd behavior.  No history of sleep walking.  He understands this may recur with an increase.  Also wants option to rarely take extra quetiapine  300 for sleep prn. Plan:  Try to reduce lorazepam  if possible.  10/22/2019 appointment with the following noted: Continues fluoxetine  60, lithium  600 mg, lorazepam  2 mg AM and HS and 1 mg midday, olanzapine  10, quetiapine  600 mg HS. Still anxious chronically and including driving in crowded spaces.  Doesn't thing Ativan  helps as well as Xanax  but less cognitive problems. Sleep is not as good.  Occ EFA.  More EMA and wanting to do things in the middle of the night but this  is not typical.  Wonders about why that happens.  Some napping.   Tolerating meds.  Asks about weight loss meds.  Disc this in detail.   Plan no changes except OK meclizine  prn vertigo.  02/11/20 appt with the following noted: Able to gradually reduce lorazepam  to 1 mg AM and HS. More depressed over time and less interested in things and less interest in going out but does with his parents. Has reduced from 800 to 600 mg HS with some awakening but usually able to go back to sleep.Albert [name]  Spending a good amount of time in bed bc watches TV in bedroom.  Parents watch TV in different part of the house.  No mood swings he notices.   Concerns about weight gain about  200#. Likes olanzapine 's benefit for sleep. Plan: Trial for TRD to  Increase fluoxetine  to 80 mg daily  to use the combo with olanzapine  for TR bipolar depression.   04/21/20 appt with following noted: I thought in beginning some benefit with fluoxetine .  Lifelong negative thinking continues.   Not much bipolar.  Reads a lot on bipolar.   Still tired and anxious a little more may be seasonal.  No familial stressors.  Tends to lay down in afternoon.  Anxiety not over anything in particular.   No SE with fluoxetine . Took meclizine  prn.  Easily motion sick.    Asks questions about newer drugs for bipolar like Caplyta . Plan:  No med changes  06/30/20 appt noted: Tired of wearing masks.  Vaccinated.  Asked questions about when this will end with mask mandate.  Mood up and down some since here and getting up 2-3 times.  Disc awakening problems.  Trying to do better bc night eating some.   Mood is about average today so far.  Albert [name] out with friends for breakfast.  Recognizes activity helps mood.   3 days in a row napped a lot in the afternoon.   Bed is a comfort place.  Gets bored and hard to motivate.  Tries to stay away from night eating and spending.   More depressed when alone and wonders about med changes. Plan: Failed response to olanzapine  + fluoxetine  for TR bipolar depression.   Reduce fluoxetine  to 1 daily and reduce olanzapine  to one half nightly for 10 days and then stop it. Then start Caplyta  1 daily  08/27/2020 appointment with the following noted: Patient decompensated with the above switch to Caplyta  with intrusive suicidal thoughts and had to be hospitalized for psychiatric reasons.  Multiple phone calls with family members since that time.  Spoke with the psychiatric nurse practitioner with the decision to restart olanzapine  in place of the Caplyta  given the patient was more stable while on the combination of Seroquel  and olanzapine  than with the Caplyta . Caplyta  triggered HI/SI and  depressed and confused on it, and agitated and still has some of it now. No akathisia. Frustrated with lack of therapy at the hospital.   Hydroxyzine  didn't help jitteriness.  Feels some better.  Fleeting SI & HI but not obsessive like it was prior to hospitalization.  Feels a little amped up and nervous.  Scared of what could happen.  Apprehensive being here talking about things. 5-6 hours sleep last night and would like to have more. At mom and dad's house right now and up more in the day. Inadvertently stopped lorazepam  abruptly by mistake likely contributing to shakiness. Still some memory issues.  Trying to stay out of bed when watching TV.  Some awakening but managing. Occ fleeting SI and distracts himself.   Always spends a lot of time just laying around.   Can have anxiety for no reason like coming here, and varies in intensity without pattern.      Less panic than in the past.  Some chronic depression and hyperactivity and loudness and hyperverbal.  Usually back to sleep.  Total 6 hours but some napping, awakens 2-4 times nightly but back to sleep..  Taking quetiapine  600 mg HS.  still lacks interest and motivation.  Can follow a TV show if interested. Plan: Restart lorazepam  1 mg in the morning, 1 mg in the afternoon and 1 mg at night for anxiety Stop hydroxyzine   Increase olanzapine  to 1 and 1/2 of 10 mg tablets in evening  09/05/2020 appt noted: seen with parents today at his request Made med changes noted and tolerated the changes Better than I did last week. Now only fleeting HI/SI and "no where near what it was".  Sleeping better.  Still not a lot of energy.  Anhedonia.  Less agitation.  A lot of anxiety all the time but it's helping.  Would like more energy and motivation.   Doesn't handle stress well. Parents note he's less shakey.  He's still staying with his parents.  Only driven once since this happened and F wants him to be able to drive and function more independently.  M agrees  he's better.  Memory is better but not normal.  Primarily STM problems No akathisia with olanzapine  this time.    Administering his own meds but mo watched. Slept 8 hours last night. Plan consider further reductions in quetiapine   10/17/20 appt noted: Stayed on Seroquel  600 HS and olanzapine  15. Olanzapine  seemed to do most for the sleep.  Also helping eliminated HI/SI for the most part.  Wants to try to increase it.  Depression is much better with less rumination also.  Fears akathisia at higher dose as in the past. Also on fluoxetine  40.   Plan: Reduce Seroquel  to 1 and 1/2 tablets at night Increase olanzapine  to 1 of the 15 mg tablet and 1/2 of the 5 mg tablet for 1 week,  Then, if tolerated increase the olanzapine  to 20 mg or 1 of the 15 mg tablets and 1 of the 5 mg tablets. If possible then also reduce quetiapine  to 1 tablet at night.  11/05/2020 appointment with the following noted: Up to olanzapine  20 mg hs for 3 days.  Reduced Serooquel to 450 mg HS.  Didn't try to go lower. Had hiatal hernia and umbilical hernia surgery recently with no problem.   No akathisia so far. Sleep ok with meds so far changes.  No mood changes yet but overall likes the smoothness of olanzapine  and helping sleep without knociing him out. Still some occ HI/SI, he thinks bc of anxiety generally.  Easily anxious. Plan: Continue olanzapine  20 mg nightly longer to give it time to help mood sx reduce quetiapine  to 1 tablet at night to minimize polypharmacy and reduce risk of akathisia.  01/07/21 appt noted: Able to reduce quetiapine  300 mg HS ok with decent sleep but still wakes a lot.  No AM hangover.  Don't have a lot to do.  Can enjoy going out and doing things.  But doesn't do it longterm.  Family is doing good.  Father started year 37 at his job.   Tolerating the meds well.  May wake more with quetiapine  reduction. Lost 10#. Wonders if  could try other meds he didn't tolerate before bc now can tolerate olanzapine   and couldn't tolerate it in the past DT akathisia. Mood never great but can swing into depresssion without reason.   Plan: Continue olanzapine  20 mg nightly longer to give it time to help mood sx reduce quetiapine  to 1/2 of the 300 mg  tablet at night to minimize polypharmacy and reduce risk of akathisia.  02/02/2021 appointment with the following noted: Tried reduced quetiapine  to 150 mg and after a week had withdrawal sx including jittery and SI and he increased to 250 mg daily.  Felt better after increasing to 300 mg daily and last week reduced to 250 mg daily.  After increase quetiapine  felt better in a few days.  Seems more alert with less quetiapine  but can't tolerate a quick withdrawal.   Some awakening.  Plan: Reduce quetiapine  by 50 mg every 2-4 weeks.  03/09/2021 phone call: Pt called and said that he is weaning off the seroquel . He was down to 200 mg of the seoquel and he started having withdrawal symptoms and sucidal thoughts. He went back up to 250 mg because he knew he was good on that.    Pt stated he starting back taking 250 mg on Saturday due to the symptoms he was having.Now he is no longer having suicidal thoughts but still very jittery. MD: He may just need to taper slower than most people.  If he can tolerate the 250 mg Seroquel  with the jitteriness it should get better over the next 1 to 2 weeks.  Then he can try going down again by 50 mg at a time.  He probably needs to go down by just 50 mg every 2 to 4 weeks and wait till he fully adjusts to each reduction before he reduces again.  03/17/21 appt noted: Hernia surgery 2 weeks ago. Seroquel  250 mg for a month then 200 mg daily and had SI and increased again to 300 mg daily.  Would love to get off it.  Withdrawal triggers intrusive HI/SI but otherwise doesn't have them No SE. Sleep is OK but not enough REM sleep.  Satisfied with other meds.   No current HI/SI.  No paranoia.  Still has anxiety and taking Ativan  every 6 hours.   It works but doesn't cure it. Chronic anxiety and ask about ketamine for chronic depression. Plan: Because he had WD reducing from 250 to 200 mg daily will have to go slowly. Reduce quetiapine  by 25 mg every 2-4 weeks. Reduce Seroquel  to 250 mg for 2 weeks, Then reduce to 225 mg for 2 weeks, Then reduce to 200 mg for 2 weeks, Then reduce to 175 mg for 2 weeks, Then reduce to 150 mg Also: Buspirone  30 mg tablets for anxiety Start 1/3 tablet twice daily for 1 week Then increase to two thirds twice daily for 1 week Then increase to 1 tablet twice daily  04/15/2021 appointment with the following noted: Reduced Seroquel  to 200 mg HS and increased buspirone  to 30 mg BID STM px and wants to try to reduce Ativan  to see if it is better.  But fears with drawal sx. Parents complain about his memory. Less HI and SI since here. Less depressed. Plan: Buspirone  30 mg tablets for anxiety Start 1/3 tablet twice daily for 1 week Then increase to two thirds twice daily for 1 week Then increase to 1 tablet twice daily Continue olanzapine  20 mg nightly longer to give it time to help mood  sx Because he had WD reducing from 250 to 200 mg daily will have to go slowly. Reduce quetiapine  by 25 mg every 2-4 weeks. Reduce Seroquel  to 250 mg for 2 weeks, Then reduce to 225 mg for 2 weeks, Then reduce to 200 mg for 2 weeks, Then reduce to 175 mg for 2 weeks, Then reduce to 150 mg Trial taper lorazepam : In hopes of improving short-term memory  04/30/2022 phone call: Complaining of withdrawal symptoms reducing lorazepam  from 0.5 mg 5 times a day to 0.5 mg 4 times daily.  05/21/2021 appointment with the following noted: Gotten onto buspar  30 BID and down on lorazepam  to 0.5 mg QID. Gotten down to Seroquel  200 mg HS A little restless every now and then but able to reduce lorazepam  for the last weeks. Is too inactive and lays in bed too much watching TV.   Can' t tell effect of buspirone  bc of other changes.   Sleep with awakening with 6-7 hours. Fleeting SI without trigger or plan or intent. Depressed more than anxious. Enjoys watching stocks and investing. Plan: Trial taper lorazepam  gradually over time In hopes of improving short-term memory But for now continue lorazepam  0.5 mg QID Start clonidine  0.1 mg tablets one half at night for 3 nights, then 1/2 tablet twice daily for 3 nights, then one half in the morning and 1 tablet at night If clonidine  does not noticeably help anxiety call the office  05/27/2021 phone call initially complaining of homicidal and suicidal thoughts after taking 1 mg of clonidine .  He was told to stop the medication.  But then called back stating he was having homicidal and suicidal thoughts from withdrawal from the medication which was not logical.  He agreed it was not logical and stated on 05/29/2021 that he felt better  06/23/2021 appointment the following noted: No clonidine . Sunday panic attack eating out and then having SI.  Stayed with father to feel safer. Intrusive thoughts of hurting parents or others.  HI only when has SI.  Usually panic without SI.  Then had intrusive thoughts about hurting others.   Usually intrusive thoughts are brief and fleeting.    07/20/21  appt noted; More anxious with less lithium .  More anxious in his body.  Not more depressed or irritable.  Sleep is the same.  More racing thoughts.  Chronic SI probably a litte worse but not unmanageable. Rduced lithium  last week to 300 mg daily. No recent change in lisinopril . Off the clonidine  for a week or so. Doesn't drink much water.  Drinks a lot of diet coke. No marked akathisia or RLS but does shake his leg DT anxiety. Worries about needing mor emedicine. Plan: Increase olanzapine  to 25 mg nightly bc more racing thoughts and anxiety with less lithium  DT severity of sx and difficulty getting off Seroquel  conside rincrease.  07/30/21 TC : CO racing SI today and wonders about whether increased  sx racing thoughts, hyperactivity, SI related to Reduced lithium  from 600 mg daily to 300 mg daily recently DT lithium  level 1.8.  could be related.    Plan: increase olanzapine  30 mg pm Today take lithium  600 mg now.  Tomorrow start lithium  CR 450 mg daily Continue olanzapine  30 mg PM until SI resolves unless akathisia returns.  Nori Beat, MD, DFAPA  08/17/21 appt noted:  No akathisia with olanzapine  30 mg pm (about 3 weeks) and is sleeping better. Increased lithium  to 450 mg daily. Tolerating meds. Still racing negative thoughts maybe some better.  Asked about it. SI come and go.   Anxiety about the same as depression.  No motivation unless has to do something. Plan: Reduce  fluoxetine  20 mg 1 daily in hopes racing thoughts and anxiety are better.  09/14/2021 appointment with the following noted: No difference noted with reduction fluoxetine  20 mg daily. Most of the time negative thoughts, ex "what if I get in a wreck?"  Frequent negative thoughts.   Still on lorazepam  0.5 mg TID.   Wonders about trying prior meds that caused akathisia bc no longer gets it with olanzapine  30 mg daily. Sleep is usually ok.  Taking Seroquel  200 mg HS.  Thinks the olanzapine  helps his sleep and doesn't want to stop it. Panic 1-2 times per month.  Then takes extra lorazepam . Plan: DC fluoxetine  20 mg 1 daily  Trial Auvelity off label for depression 1 in the AM for 1 week then 1 in AM and 1 with evening meal  10/15/2021 appointment with the following noted: Occ of getting confused as to day and went to church on a Thursday my mistake.   Had it happen a few months ago too.  Memory is terrible. Thinks he couldn't tolerate more Auvelity. Not a lot of benefit from Churdan 1 daily. Still quite a bit of anxiety.   A little drowsy  and sleeps 7-8 hours at night. Word finding problems.  Lost wallet. Plan: DC  Auvelity  For [facility]: memantine  1/2 tablet in the AM for 1 week, then 1/2 tablet twice daily, then 1/2  tablet in the AM and 1 tablet at night for 1 week then 1 tablet twice daily  10/22/2021 phone call complaining of additional stress asking to start new medication because of anxiety and more suicidal thoughts without intent or plan.  Reports taking more than 4 prescribed Ativan  a day recently.  Asking to increase olanzapine  which is already at 30 mg daily. MD response:We cannot go any higher in the dosage of olanzapine  that he is currently taking.  A lot of these suicidal thoughts are apparently anxiety driven.  Let us  have him resume taking paroxetine  which is one of the more effective medicines for anxiety.  I will send it in and the instructions will be paroxetine  20 mg tablets 1/2 tablet daily for 1 week then 1 tablet daily  11/16/21 appt noted: Multiple phone calls since she was here.  Complaining of suicidal thoughts from memantine  which was stopped. Started paroxetine  and up to 20 mg daily.  Continues olanzapine  30 mg, lithium  450 mg nightly, lorazepam  0.5 mg 4 times daily , And quetiapine  200 mg nightly. Questions about whether memantine  caused the neg SI really bc has them at times. When has SI often anxiety driven.  Will tend to ruminate on past relationship ended.   Edgy and irritable all the time but memantine  seemed to make it worse. Mind is getting better with less SI lately.   No SE with paroxetine .   Aggrivated by forgetfulness.  eXample, talked with someone about going to breakfast last night and then forgot to go this mornin.g. Plan prescribed donepezil  5 mg daily for cognitive complaints specifically forgetfulness  12/31/2021 appointment with the following noted: He had been prescribed donepezil  5 mg for cognitive complaints.  He called back reporting he felt it was causing homicidal thoughts and was instructed to stop it.  He has had these types of thoughts in the past.  He had no desire to act on them. Discouraged. Ongoing depression and reduced  enjoyment and negative thoughts.   Can enjoy some things. A lot of anxiety chronically.  Trying to limit Ativan  to 5 of 0.5 mg daily.  Sometimes needs 2. Constipation for years.  02/03/22 appt noted: Depression makes LBP worse. Ongoing chronic depression. Tolerated rivastigmine  without triggering SI Chronic anxiety and Ativan  helps some. Occ panic but not usual.  Asks if any other meds coud be used. Plan: But for now continue lorazepam  0.5 mg QID, but use LED for cog reasons Increase rivastigmine  to 3 mg capsule 1 twice daily or 2 of the 1.5 mg capsules twice daily.  Call if you have problems with nausea Start dextromethorphan  1 capsule twice daily . Thi sis off label for depression based on MOA of Auvelity and use of paroxetine  to prolong half life of DM. Presence of paroxetine  may have been reason he didn't tolerate Auvelity brief trial before.  Will proceed slowly.  03/17/2022 appointment noted: Current psych meds: Rivastigmine  3 mg twice daily, quetiapine  200 mg nightly, paroxetine  40 mg daily, olanzapine  30 mg nightly, Namenda  10 mg daily, lorazepam  0.5 mg 4 times daily, lithium  CR 450 mg nightly, dextromethorphan  15 mg twice daily A lot of anxiety since here.  Some days taken more lorazepam  to 6-7 tablets daily.  No major triggers but anxious even about coming here and other normal things.  Cancelled trip to collect rocks with friend and cancelled the trip bc anxious.  Usually if can go he is ok.  Lots of SI ongoing Memory might be a little better. Asks how he responded to Vraylar.  Will check paper chart.  03/24/22 TC:  CO SI worse and restilless with Rivastigmine  9 mg . MD response:  Stop the rivastigmine  since the [place] appears to be having side effects from it.  He was having anxiety at the last appointment 2 and then we increased the dose.  This might be causing the anxiety to be worse. He has chronic suicidal thoughts without intent or plan.  However it appears the thoughts are more intense with the rivastigmine       05/06/21 appt noted: Stopped rivastigmine  DT anxiety and worsening SI.  Was better the next day. This week had some SI.  Thinking about the prior relationship tends to lead to SI. Will take Ativan  when gets SI and it helps a little at 0.5 mg at a time. Plan: Consider fluvoxamine .  Yes this may help with obsessions on old GF and he failed other optioons. Start fluvoxamine  1/2 of 50 mg tablet and reduce paroxetine  to 1 and 1/2 of 20 mg tablets for 5 days, Then increase fluvoxamine  to 1 tablet daily and reduce paroxetine  to 1 tablet daily for 5 days, Then increase fluvoxamine  to 1-1/2 tablets daily and reduce paroxetine  to 1/2 tablet daily for 5 days, Then increase fluvoxamine  to 2 tablets daily and stop paroxetine    06/07/22 appt noted: Feels like the fluvoxamine  50 BID is too much.  On this a couple of weeks. Worries he might be worse with this with regard to SI.    He thinks it's worse with increase.   New neighbor who has a young boy. He worries that Albert [name] is triggering depression and intrusive SI/HI a couple of days later and lasts a little while. Has had anxiety turning into panic attacks.  This gets associated with depression and SI/HI.  These thoughts are intrusive and he doesn't want to act on them.  Can have HI even towards family with whom he has no negative  emotions.  Little things like a bump up in parking lot sets off his depression and anxiety and doesn't seem to handle things.   No sig akathisia.  In the past it's been continuous.   Awakens every 2-3 hours.  Usually goes back to sleep with meds. Has not had alcohol in 20 years and had some intrusive thoughts without drinking.   Plan: As soon as he feels comfortable increase fluvoxamine  to 50 mg AM and 100 mg PM for intrusive thoughts of HI/SI and obs on GF  07/06/22 appt noted: Increased fluvoxamine  to 150 mg daily and felt more agitated and some SI so reduced to 100 mg 4 days ago and feels better so far.  Likes the fact it is good  for anxiety and obs thoughts.  Wonders if there is a similar med.  Frustrated he can't get relief.  Some improvement in obs on exGF but not resolved. Continue other meds: lithium  CR 450 mg HS, lorazepam  0.5 mg QID, Memantine  10 BID, olanzapine  30 mg HS, quetiapine  200 mg HS Tingling and burning in legs at night but doesn't interfere with sleep. Got insurance to pay for [facility] and lost 13#.   No full panic lately. But some anxiety waves.   Still major problems with memory. Plan: Wean fluvoxamine  50 mg daily for a month then 1/2 daily for 2 weeks then stop it DT  NR.  Watch for more anxiety.  07/28/22 TC: Albert [name], CMA  to Me     07/28/22  4:16 PM Note Mom called reporting patient has been confused, dizzy, and sluggish for the last 3 days. He is now staying with her. She had several questions in regards to this:   Lithium  level - she feels he needs one done, last result in Epic was 08/2021 that I see. She would like order sent to [facility] for tomorrow.   Patient is on Wegovy, has been for about a month, and she wonders if this could cause sx.   Patient has meclizine  and she is questioning if she should give that for his dizziness considering other sx.   She questions if perhaps he got up during the night and took additional medications, though doesn't know if this happened and if so what he took.    On 3/5 visit he was to wean fluvoxamine  to 50 mg qd for a month and then 1/2 tab for 2 weeks and then stop. Mom and pt unsure if he started the wean.    He has F/U 4/4.        Me  to Albert [name], CMA   CC   07/28/22  5:44 PM Note Yes get lithium  level in the AM and don't take any lithium  tonight or in the morning before the blood test.     Albert [name] should not cause these sx except if he is losing wt quickly or is dehydrated it can cause lithium  toxicity and include these sx.  He needs to drink lots of water.   He should go ahead and wean the fluvoxamine  as instructed in the last  appt.   If the sx get worse, go to the ER.    Me   CC   07/29/22  6:49 PM Note RTC   Sx not as severe as when he had to go to hospital in the past.  Albert [name] got Lithium  level today.   Speech is not good .  Confused at times, but better today.  A little  tremor .  A little balance problems.  No falls.  No diarrhea Took lihtium 450 last night. Mo says he got up in the middle of the night and moved things around last night and he doesn't rmember.   No change in night time meds.   Gradually reducing fluvoxamine .   No extra lorazepam .     Spoke with mother who verifies the same.   No med changes but push fluids.   Nori Beat, MD, DFAPA      08/05/22 appt : with parents Pt hosp 08/02/22 with acute kidney injury and somewhat elevated lithium  level with some confusion.  Spoke with psych NP at hosp and lithium  was decreased to 450 mg HS. No pending appt known with medical doctors about kidney function. Don't feel good and having HI and SI and feels he needs lithium  for these issues.  Doesn't feel he should have been discharged.   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 No added meds at night but has been confused at night and getting up multiple times and doing things he doesn't remember.  This concerns parents.  Parents been up at night with him and he doesn't make sense.  Still groggy in the am per mo for an hour.   Plan: Plan: Get lithium  level and kidney test ASAP Stop fluvoxamine  Reduce lithium  300 mg capsule 1 at night Monday night: reduce to olanzapine  to 20 mg at night (stop the 10 mg tablet) and start clozapine  prescription. Monday 4/15 : increase clozapine  to 100 mg at night and reduce quetiapine  to 1/2 of 200 mg tablet and continue olanzapine  20 mg each evening.  08/01/22 & 08/11/22 ED for HI and SI  08/12/22 TC mother:  Mother, Albert [name] called. Per note made by Abundio Abts. He was referred to [facility].Reporting that he did go up there last night. This morning they called her and told her  to come pick him up. They told her he wasn't having homicidal or suicidal thoughts and it would be four to five days of him waiting to go to a facility in [place]. They recommended him for PHP to start 4/16. She picked him up today. He is worried about these thoughts coming back up. Also, [facility] came out yesterday to assess him and offered counseling from [facility]. He wants to do counseling with them.  Any input on care he can receive?  He is so discouraged and wants to be safe.  (She notes that the [facility] gave him- Zyprexa  10mg  at 1:07am and again at 1:25am per the med list- based on his previous med list) He was supposed to be off of Zyprexa10mg . Has been on  Clozapine  since Monday. He did go ahead and take it before he left ( 2pills) so he hasn't missed a dose and did go get labwork done. Is wanting to continue the Clozapine , please check the labwork to be able to fill the 100mg  dose.    MD resp:  Continue all meds as RX except go ahead and increase clozapine  to 100 mg tablets, 1 each night as soon as he can get the 100 mg tablets from pharmacy.  RX sent.  Trying to speed up his chance of response by getting it to a hgiher dose.  Clozapine  is very effective for HI/SI.  If he gets worsening HI/SI in the interim it will not be caused by meds but just the natural waxing and waining of his sx.  Once clozapine  dose gets high enough it should help but  avg patient needs 300 mg so we have to just be patient.      08/20/22 appt:  Up to 100 mg clozapine  HS SE constipation and using Colace, Miralax .  Been to [facility] twice  since here.   Ongoing continuous HI/SI.   All I want to do is go to sleep to escape.  Dep causing the thoughts.   Taking Ativan  0.5 mg BID-QID.  He thinks mother not giving him enough of it.  He thinks anxiety triggers HI/SI.  He fears HI and hopes he's strong enough to reisist.  Not angry or wanting to hurt anyone in reality but has the intrusive thoughts. Conc and memory still not good.   Sleep not much  different.   Staying with parents currently. Dep gotten worse M worrying he's dependent on lorazepam .   Confusion at night is no longer happening.  Not wandering at night.  Plan: For severe constipation start Linzess  145 mg AM Get lithium  level and kidney test ASAP Stopped fluvoxamine  and confusion at night is gone  lithium  300 mg capsule 1 at night  olanzapine  to 20 mg at night  increase clozapine  to 150 mg at night for 3 nights then 200 mg HS   olanzapine  20 mg each evening. Continue lorazepam  0.5 mg QID, needs this and informed mother  09/07/22 appt noted: Psych meds: clozapine  250 MG HS, lithium  300 mg HS, lorazepam  0.5 mg BID-QID, olanzapine  20 .  Off Seroquel , linzess  290 mg daily, Miralax , Colace. Sleep 7-8 hours without change from before clozapine .   Mood is still very depressed and still gets anxiety. Still problems with constipation.   Dep is so much it is causing intrusive HI/SI.   No more bizarre behaviors at night.   Plan: Plan: Get lithium  level and kidney test ASAP  lithium  300 mg capsule 1 at night  olanzapine  to 20 mg at night  increase clozapine  to  300 mg HS as soon as tolerated and then 400 mg HS.  olanzapine  20 mg each evening. Continue lorazepam  0.5 mg QID, needs this and informed mother  10/07/22 appt noted: alone and with parents Rough month.  Altered voice for a couple of weeks or so..  Mind not good.  Feel out of sorts.  Jittery.  Constipation.  No appetite.  On Wegovy.  Saw GI doc.   May need to reduce Wegovy bc poor appetite and constipation.  Lost 25 #. Current psych med: clozapine  400 mg HS about a week or 2, olanzapine  20 mg HS, lithium  300 mg HS, lorazepam  0.5 mg QID. Reduced intrusive thoughts.  2 nights since here night eating.   Sleep 6 hours.  Limited napping during the day.   Anhedonia.  Lower motivation.  Tremor worse. M says he sleeps pretty well but awakens  BP is under control. Plan Reduce lithium  to 150 mg capsule 1 at night DT level  higher Reduce  olanzapine  to 10 mg at night  Continue clozapine  400 mg HS bc helping SI and HI and  Continue lorazepam  0.5 mg QID, needs this and informed mother  11/08/22 appt noted:  alone and with parents. Ongoing calls between visits Doesn't think clozapine  helping him sleep as well as olanzapine  for sleep or any other benefit. While at restaurant had intrusive HI/SI. Staying with parents seems to provoke intrusive HI.  Is grateful to them.  Sat woke up with them.  Sometimes goes away and sometimes not.  No know trigger and no intent. Saw PCP.  Off  Wegovy.  Started metformin . No effect noted from clozapine .   M limits his lorazepam .  PCP referring him to neuro to FU abnormal head scan.  He wonders if his speech and memory problems are related.   Plan: Cont clozapine  400 mg HS Check clozapine  level and then direct dosing.  11/16/22 TC from pt to disc level:  MD resp:  Clozapine  level ok but may benefit with increase to 500 mg nightly.  Tell him to increase with current supply.     12/06/22 TC from pt:  complaining of worsening intrusive ego-dystonic HI/SI without intent, desire or plan.   MD resp ok to increase clozapine  to 600 mg HS.  12/09/22 appt noted: Complaining of ongoing racing thoughts and worsening HI/SI without intent or desire or plan.  Racing thoughts chronic and other thoughts wax and wane.  Usually triggered if starts thinking of something negative Meds: clozapine  600 mg HS, lithium  150, lorazepam  0.5 mg QID, olanzapine  10 HS Sleep with EFA.   Will have trouble going to sleep sometimes with thoughts. Doesn't think clozapine  makes him sleepy.  Not really drowsy daytime. No dx OSA and no known apnea.  SE a little dizzy.  Constipation managed with BM every 2-3 days. Better than before.  PCP RX metformin  and it helped. Plan: Plan for intrusive HI/SI wean olanzapine  and trial of risperidone .  Reduce olanzapine  to 5 mg HS for 1 week then stop it.  Addressed his fears of not  sleeping off olanzapine . Then if sleeping start risperidone .  01/11/23 appt noted:  also seen with parents Psych meds: clozapine  600, lorazepam  0.5 mg QID, No olanzpaine ,  lithium  150 mg daily, no risperidone  yet. Lost 45# on Wegovy down to 150#.   Still having intrusive HI/SI as noted before. Sleep no worse.  Takes a couple of hours after clozapine  to sleep.  EFA 2-3 times nocturia.   Still racing thoughts.  Ongoing px with conc, memory,  planning Scored 73 on 100 pt test for cognition.   Some trouble with articulation and speech at times.   "If could get rid of racing and intrusive thoughts I'd be 100% better." Neuro says no stroke history and low B12 and workup ongoing at [facility], Albert [name]. Memory is still terrible.   Plan: Start risperidone  2 mg tablet, 1 at night for racing thoughts and intrusive homicidal and suicidal thoughts. Call in 3 weeks if not better and we will increase the dose  02/09/23 appt: seen also with parents , 60 min  Lost to 152#. Meds: as above, risperidone  incr to 4.5 mg HS for intrusive HI/SI SE drooling off an on.  Tremor mainly worse with a little more risperidone  HI/SI intrusive thoughts wax and wane.  Worse when lays down. Takes 2 hours to fall asleep.  Needs to get OOB when awakens.  6 hours sleep. No falls.   Would rather try to increase risperidone  than other options.    04/12/23 appt virtual:  seen alone CC:  Multiple phone calls about ongoing intrusive, obsessive, ego dystonic HI/SI thoughts without plan or intent. Didn't come to the office bc of these thoughts.  I know I don't want to do it.  Can last four hours.  Sees drug advertisements that talk about SI and that will trigger obsessions about it. Meds: clozapine  600 mg HS,  lithium  150 mg HS, lorazepam  0.5 mg TID, risperidone  increased to 8 mg and now back to 4mg  HS.   Neuro sched MRI tomorrow. Enough sleep  5-6 hours at night.  Not napping.   Sometimes gets confused with med but not ususally.    No recent mania.   Depressed and anxious. Plan: AVS: Reduce risperidone  to 1/2 of 4 mg tablet in evening for 1 week then stop it. Reduce clozapine  to 5 tablets nightly Start fluvoxamine  100 mg tablet 1/2 tablet nightly for 1 week then 1 tablet nightly for obsessive, intrusive thoughts  05/25/23 appt in person:  not with parents, met with [facility] with 2 sz and then had psych hosp since here.  Another EEG 06/03/23.  Neuro DR.  Alwyn Baas Keppra .  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 Psych med: clozapine  reduced to 300 HS, stopped ? Risperidone  he's not sure, clonazepam  0.5 mg BID, lithium  150 HS, trazodone  50 prn rare.  Keppra  500 BID. Trazodone  good sleep med. SE drooling again.   Not sure any mental health effect of Keppra .   Denies any street drugs despite positive UDS for ecstasy. Overall still having intrusive HI/SI without intent or plan chronically but maybe is a bit better than before hosp stay. Sleep is better.  Lost 40# on Wegovy.  Plan no change  06/22/23 appt noted: EEG shows some epileptiform activity with FU pending with another 4 hour EEG pending. Meds unchanged Overall mental health about the same or a tad bit better.  Seems he can distract himself from intrusive thoughts and they are less common than they were.   CC mouth watering from med.  Choking middle of the night.  Atropine  helps some but has to be repeated and "pain in the ass".   Sleep 6 hours.  Sometimes taking trazodone  50.  Sleep is not that bad.  Psych med: clozapine  reduced to 300 HS, , clonazepam  0.5 mg BID, lithium  150 HS, trazodone  50 prn rare.  Keppra  500 BID. Plan no changes  07/20/23 appt noted:  alone and with parents. Psych med: clozapine  reduced to 300 HS, , clonazepam  0.5 mg BID, lithium  150 HS, trazodone  50 prn rare.  Keppra  500 BID. B12 oral.  EEG sched for 08/2023; appt 08/25/23 Maybe a little better but still same intrusive thoughts.  They cause him to have somatic  anxiety like palpitations.  "You know I don't want to do any of those thoughts".    Out of the blue.   SE still drooling.  Using drops some.  But not often. Anna Slayden therapist in [place].  Likes her.  She's good at teaching . Awakening 2-3 times night nocturia usually.  Back to sleep ok Parents think he is better with more interest. Seems less disturbed .  More outgoing and social and mother agrees. Switched lorazepam  to clonazepam  0.5 mg BID seems to be working better and more calming and lasts longer.  08/18/23 appt noted: Med: clozapine  reduced to 300 HS, , clonazepam  0.5 mg BID, lithium  150 HS, trazodone  50 prn rare.  Keppra  500 BID. B12 oral.  Appt next week with Dr. Bernetta Brilliant to review EEG. No med changes.  Still on above. SE drooling. No effect from atropine  2 drops . Mood not worse but not a whole lot better Anxiety still better with clonazepam  vs lorazepam . Sleep pretty good 5-6 hours and would like to sleep more.  Nocturia.   Not sedated from clozapine .  Rare naps. No other medical px except about EEG.   Low motivation bc doesn't feel like it.  So disgusted with lack of response with meds.   Does  limited chores.  Has cleaning lady.  At parent's house off and on. Go through the motions.  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  Just lay around.   Plan: No med changes.  Will wait to make med changes until after EEG work up completed.    09/19/23 appt noted: Med: clozapine  reduced to 300 HS, , clonazepam  0.5 mg BID, lithium  150 HS, trazodone  50 prn rare.  Keppra  500 BID. B12 oral.  Neuro  Dr Mason Sole EEG spikes not likely to cause psych sx Frustrated with chronic psych sx dep.  Gets angry over the fact that no meds seem to help. Goes out to eat and enjoys food but doesn't feel like doing much.   No sig change in psych sx.  Chronic anxiety and dep.  Anxiety is better with clonazepam  than other meds.  Will get triggered in loud places with agitation.  Low tolerance of stimulation. OCD therapist Anna  Slayden , [place], said she couldn't help his chronic intrusive HI/SI.   Still has spells of intrusive HI/SI  Decided more even anxiety benefit with clonazepam  vs lorazepam .  Anxiety less on clonazepam .  Psych med hx extensive including ECT and  Risperidone  Maxed out risperidone  8 mg without benefit for intrusive thoughts  Zyprexa  30 Latuda 80 which caused akathisia,  Vraylar, Rexulti, aripiprazole 20 mg with akathisia,  Seroquel  1000 mg,  InVega, Geodon , Saphris with side effects, symbyax, Fanapt NR .  Caplyta  SE and markedly worse. Clozapine  started end of March lithium  1200 SE,  lamotrigine 300 mg, Depakote 2000 mg, Tegretol, Trileptal and several of these in combinations, gabapentin ,  N-acetylcysteine, Nuedexta,     Belsomra with no response,  Lunesta no response, trazodone  200 mg,  Xanax , clonazepam , lorazepam  less sedation. Buspirone  NR Clonidine  SE with 2 trials  Viibryd 40 mg for 3 months with diarrhea,  protriptyline with side effects,  Trintellix 20 mg,  Parnate 50 mg with no response,   Emsam 12 mg for 2 months,   imipramine,  venlafaxine,   bupropion was side effects,  Lexapro 20 mg, sertraline, paroxetine , Deplin, fluoxetine  80,  fluvoxamine  150 agitated; retrial ? Sz related.   Auvelity NR at one daily.  SE BID  methylphenidate 60 mg,  Vyvanse, Concerta, strattera, , modafinil,  Memantine  worsening SI? Donepezil , complained of HI, Exelon  3 BID rivastigmine  DT worsening SI  pramipexole,  amantadine ,  Patient prone to akathisia.  [facility]  HX ECT   Review of Systems:  Review of Systems  Constitutional:  Positive for appetite change and fatigue.  Cardiovascular:  Negative for chest pain and palpitations.  Gastrointestinal:  Positive for constipation.  Musculoskeletal:  Positive for back pain.  Neurological:  Positive for dizziness and tremors. Negative for weakness.       Fidgety  Psychiatric/Behavioral:  Positive for decreased  concentration, dysphoric mood and suicidal ideas. Negative for agitation, behavioral problems, hallucinations, self-injury and sleep disturbance. The patient is nervous/anxious. The patient is not hyperactive.     Medications: I have reviewed the patient's current medications.  Current Outpatient Medications  Medication Sig Dispense Refill   acetaminophen  (TYLENOL ) 500 MG tablet Take 1,000 mg by mouth every 6 (six) hours as needed for moderate pain.     atropine  1 % ophthalmic solution Place 2 drops under the tongue 3 (three) times daily as needed. 2 mL 1   cholecalciferol  (VITAMIN D3) 25 MCG (1000 UNIT) tablet Take 1,000 Units by mouth daily.     clonazePAM  (KLONOPIN ) 0.5 MG  tablet TAKE ONE TABLET BY MOUTH TWICE DAILY 60 tablet 0   cloZAPine  (CLOZARIL ) 100 MG tablet Take 3 tablets (300 mg total) by mouth at bedtime. 90 tablet 3   Cyanocobalamin  (VITAMIN B-12 IJ) Inject as directed every 30 (thirty) days.     esomeprazole (NEXIUM) 40 MG capsule Take 40 mg by mouth daily.     fenofibrate  (TRICOR ) 145 MG tablet Take 145 mg by mouth daily.     levETIRAcetam  (KEPPRA ) 500 MG tablet Take 1 tablet (500 mg total) by mouth 2 (two) times daily. 60 tablet 0   levothyroxine  (SYNTHROID , LEVOTHROID) 75 MCG tablet Take 75 mcg by mouth daily before breakfast.     lithium  carbonate 150 MG capsule TAKE ONE CAPSULE BY MOUTH AT BEDTIME 90 capsule 0   lubiprostone  (AMITIZA ) 24 MCG capsule Take 24 mcg by mouth 2 (two) times daily with a meal.     metFORMIN  (GLUCOPHAGE -XR) 500 MG 24 hr tablet Take 500 mg by mouth 2 (two) times daily with a meal.     tamsulosin  (FLOMAX ) 0.4 MG CAPS capsule Take 1 capsule (0.4 mg total) by mouth daily. 90 capsule 3   traZODone  (DESYREL ) 50 MG tablet Take 1 tablet (50 mg total) by mouth at bedtime as needed for sleep. 90 tablet 0   No current facility-administered medications for this visit.   Medication Side Effects: Other: mild sleepiness.  Occ twitches.\, tremor  Allergies:   Allergies  Allergen Reactions   Meloxicam Other (See Comments)    Dizziness   Zyprexa  [Olanzapine ] Other (See Comments)    Akathesia   Nsaids Other (See Comments)    Hallucinations   Ibuprofen Other (See Comments)    Can not take because taking lithium    Prednisone Other (See Comments)    Can't sleep    Wellbutrin [Bupropion] Other (See Comments)    Suicidal thoughts    Past Medical History:  Diagnosis Date   ADHD (attention deficit hyperactivity disorder)    Anemia    Anxiety    Bipolar disorder (HCC)    BPH (benign prostatic hyperplasia)    Chronic kidney disease, stage 3b (HCC)    Constipation    DDD (degenerative disc disease), cervical    Depression    Deviated septum    ED (erectile dysfunction)    GERD (gastroesophageal reflux disease)    Graves disease    History of hiatal hernia    Hypertension    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    Hypothyroidism    Pneumonia    PONV (postoperative nausea and vomiting)     Family History  Problem Relation Age of Onset   Prostate cancer Neg Hx    Bladder Cancer Neg Hx    Kidney cancer Neg Hx     Social History   Socioeconomic History   Marital status: Single    Spouse name: Not on file   Number of children: Not on file   Years of education: Not on file   Highest education level: Not on file  Occupational History   Not on file  Tobacco Use   Smoking status: Former    Current packs/day: 0.00    Types: Cigarettes    Quit date: 2010    Years since quitting: 15.3    Passive exposure: Past   Smokeless tobacco: Former    Types: Engineer, drilling   Vaping status: Never Used  Substance and Sexual Activity   Alcohol use: Not Currently  Drug use: Never   Sexual activity: Yes  Other Topics Concern   Not on file  Social History Narrative   Not on file   Social Drivers of Health   Financial Resource Strain: Low Risk  (11/03/2022)   Received from [facility], [facility]   Overall Financial Resource Strain (CARDIA)    Difficulty of Paying Living Expenses: Not very hard  Food Insecurity: No Food Insecurity (04/17/2023)   Hunger Vital Sign    Worried About Running Out of Food in the Last Year: Never true    Ran Out of Food in the Last Year: Never true  Transportation Needs: No Transportation Needs (04/17/2023)   PRAPARE - Administrator, Civil Service (Medical): No    Lack of Transportation (Non-Medical): No  Physical Activity: Not on file  Stress: Stress Concern Present (08/12/2020)   Received from [facility], [facility] - Occupational Stress Questionnaire    Feeling of Stress : Very much  Social Connections: Unknown (09/01/2021)   Received from [facility], [facility]   Social Network    Social Network: Not on file  Intimate Partner Violence: Not At Risk (04/17/2023)   Humiliation, Afraid, Rape, and Kick questionnaire    Fear of Current or Ex-Partner: No    Emotionally Abused: No    Physically Abused: No    Sexually Abused: No    Past Medical History, Surgical history, Social history, and Family history were reviewed and updated as appropriate.   Please see review of systems for further details on the patient's review from today.   Objective:   Physical Exam:  There were no vitals taken for this visit.  Physical Exam Constitutional:      General: He is not in acute distress.    Appearance: He is well-developed.  Musculoskeletal:        General: No deformity.  Neurological:     Mental Status: He is alert and oriented to person, place, and time.     Cranial Nerves: No dysarthria.     Motor: Tremor present.     Coordination: Coordination normal.     Comments: mild tremor  Psychiatric:        Attention and Perception: Attention and perception normal. He does not perceive auditory or visual hallucinations.        Mood and Affect: Mood is anxious and  depressed. Affect is not labile, blunt, angry or tearful.        Speech: Speech normal. Speech is not slurred.        Behavior: Behavior normal. Behavior is not slowed. Behavior is cooperative.        Thought Content: Thought content is not delusional. Thought content includes homicidal and suicidal ideation. Thought content does not include suicidal plan.        Cognition and Memory: Cognition normal. He exhibits impaired recent memory.        Judgment: Judgment normal.     Comments: Insight fair Chronically talkative .directable. But chronically mildly pressured but less so and calmer than usual. He is chronically anxious  .  No manic signs noted. anhedonia Intrusive obsessions episodic HI/SI are the CC but is better since Dec Alert and oriented.      Lab Review:     Component Value Date/Time   NA 144 04/17/2023 0314   NA 135 10/20/2022 1123   NA 142 03/20/2014 0514   K 4.4 04/17/2023  0314   K 4.1 03/20/2014 0514   CL 109 04/17/2023 0314   CL 112 (H) 03/20/2014 0514   CO2 28 04/17/2023 0314   CO2 26 03/20/2014 0514   GLUCOSE 92 04/17/2023 0314   GLUCOSE 99 03/20/2014 0514   BUN 16 04/17/2023 0314   BUN 22 10/20/2022 1123   BUN 7 03/20/2014 0514   CREATININE 1.43 (H) 04/17/2023 0314   CREATININE 0.78 03/20/2014 0514   CALCIUM  8.7 (L) 04/17/2023 0314   CALCIUM  9.5 03/20/2014 0514   PROT 6.5 04/14/2023 1047   PROT 7.3 03/18/2014 1459   ALBUMIN 3.8 04/14/2023 1047   ALBUMIN 3.2 (L) 03/18/2014 1459   AST 21 04/14/2023 1047   AST 16 03/18/2014 1459   ALT 10 04/14/2023 1047   ALT 16 03/18/2014 1459   ALKPHOS 39 04/14/2023 1047   ALKPHOS 105 03/18/2014 1459   BILITOT 0.3 04/14/2023 1047   BILITOT 0.3 03/18/2014 1459   GFRNONAA 56 (L) 04/17/2023 0314   GFRNONAA >60 03/20/2014 0514   GFRAA 54 (L) 04/18/2019 1612   GFRAA >60 03/20/2014 0514       Component Value Date/Time   WBC 4.8 08/24/2023 1313   WBC 6.5 04/21/2023 0946   RBC 4.17 08/24/2023 1313   RBC 3.92 (L)  04/21/2023 0946   HGB 12.7 (L) 08/24/2023 1313   HCT 37.5 08/24/2023 1313   PLT 404 08/24/2023 1313   MCV 90 08/24/2023 1313   MCV 89 03/20/2014 0514   MCH 30.5 08/24/2023 1313   MCH 30.9 04/21/2023 0946   MCHC 33.9 08/24/2023 1313   MCHC 32.7 04/21/2023 0946   RDW 11.6 08/24/2023 1313   RDW 12.5 03/20/2014 0514   LYMPHSABS 2.3 08/24/2023 1313   LYMPHSABS 3.2 03/20/2014 0514   MONOABS 0.4 04/21/2023 0946   MONOABS 1.0 03/20/2014 0514   EOSABS 0.3 08/24/2023 1313   EOSABS 0.4 03/20/2014 0514   BASOSABS 0.1 08/24/2023 1313   BASOSABS 0.1 03/20/2014 0514    Lithium  Lvl  Date Value Ref Range Status  04/14/2023 0.20 (L) 0.60 - 1.20 mmol/L Final    Comment:    Performed at [facility], [address], [place], [place] [address]   08/12/21 lithium  level 0.9 on 450 mg.  lithium  level Sept 0.8.   lithium  level July 27, 2018 was normal at 1.0.   Lithium  level [facility] October 03, 2018 = 1.2. Said he got lithium  level at [facility] as requested.  Labs not in Epic.  Recent lipids ok except higher TG than usual.  Normal A1C.  11/10/22 Checked clozapine  level on 400 mg HS= cloz 733+norclozapine 294 = total 1027  06/07/23  Vitamin B12 >300 pg/mL 848  On oral supplement  .res Assessment: Plan:    Jayceon was seen today for follow-up, depression and anxiety.  Diagnoses and all orders for this visit:  Severe bipolar I disorder, current or most recent episode depressed (HCC)  GAD (generalized anxiety disorder)  Panic disorder with agoraphobia  Insomnia due to mental condition  Other obsessive-compulsive disorders  Mild cognitive impairment  Sialorrhea  Low serum vitamin B12  Seizure (HCC)  Long term current use of clozapine     60 min appt with pt  needs a lot of time   .   Seen alone and then with parents for additional info.  Pt is fair historian.  Chronic TR bipolar mixed and chronic anxiety.  He usually has mixed bipolar symptoms which we have not been able to  completely eliminate.  See long list of meds tried.   He is  chronically unstable .  Mood is relatively stable except for the severe intrusive, ego-dystonic persistent obsessive HI/SI without intent or plan. Hx chronic intrusive ego dystonic thoughts HI/SI.  More like obsessions than true HI/SI.   No recent mania.  Chronic dep and anxiety.  But is a little better since being on Keppra .   Psych hosp 08/02/22 with SI and acute kidney injury and elevated lithium . Processed this and disc goal of trying to stop lithium  but risk of worsening SI.  He has chronic SI and there's risk of this worsening off lithium .  Lithium  and clozapine  are the most effective meds for SI.    hosp for SZ episodes.  Fluvox stopped and clozapine  reduced to 300 mg HS.   Was RX risperidone  but he stopped that after DC.  It was ineffective in at least 2 trials.  We discussed the short-term risks associated with benzodiazepines including sedation and increased fall risk among others.  Discussed long-term side effect risk including dependence, potential withdrawal symptoms, and the potential eventual dose-related risk of dementia.  But recent studies from 2020 dispute this association between benzodiazepines and dementia risk. Newer studies in 2020 do not support an association with dementia.  Unfortunately due to the severity of set his symptoms polypharmacy is a necessity.    Lithium  being used bc chronic SI and death thoughts.  Counseled patient regarding potential benefits, risks, and side effects of lithium  to include potential risk of lithium  affecting thyroid  and renal function.  Discussed need for periodic lab monitoring to determine drug level and to assess for potential adverse effects.  Counseled patient regarding signs and symptoms of lithium  toxicity and advised that they notify office immediately or seek urgent medical attention if experiencing these signs and symptoms.  Patient advised to contact office with any questions  or concerns.  Reduced lithium  to 150 mg daily Dt hx toxicity Checked lithium  level and BMP and level 1.1 on 300 mg Daily so reduced again to 150 mg daily and level 0.4 April 20, 2024 Call if death thoughts worsen or worsening SI.  Disc clozapine  and it's level..  Disc data showing less SI with it but he's had no changes.    Disc risk in detail including low WBC with complication, myocarditis.  Extensive discussion of CBC monitoring.  Disc going higher despite level and checking ECG bc cardiac risk at higher doses.   Disc this again bc taking Wegovy might help him to tolerate it better.   clozapine  600 mg HS was Started 12/07/22. Started clozapine  08/05/22. 11/10/22 Checked clozapine  level on 400 mg HS= cloz 733+norclozapine 294 = total 1027 Reduced to 300 mg HS at the hospital. CBC every 4 weeks, now that on it over a year.   Use the atropine  drops prn 2-4 on each side.  It's managed.  Did he take duloxetine?  Might help depression more.  Consider retry pramipexole off label  Consider Qelbree.  Lamotrigine not a good option.    Switched lorazepam  to clonazepam  0.5 mg BID  working better and more calming and lasts longer. M administers  For MCI had to stop rivastigmine  DT worsening SI. Neuro [name] in progress.   EEG on 06/03/23 bc recent sz 08/25/23 appt Dr. Mason Sole:  "Seizure-like activity Intermittent seizure-like discharges from the left temporal lobe on long-term EEG. No breakthrough seizures since last visit. Currently on levetiracetam  500 mg twice daily. Requests a once-daily regimen. 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 Discharges are insufficient to cause psychiatric issues. Levetiracetam  XR offers once-daily dosing convenience without altering the total daily dose. - Switch to levetiracetam  1000 mg XR once daily, to be taken at nighttime. "   We discussed the short-term risks associated with benzodiazepines  including sedation and increased fall risk among others.  Discussed long-term side effect risk including dependence, potential withdrawal symptoms, and the potential eventual dose-related risk of dementia.  But recent studies from 2020 dispute this association between benzodiazepines and dementia risk. Newer studies in 2020 do not support an association with dementia.  Discussed safety plan at length with patient.  Advised patient to contact office with any worsening signs and symptoms.  Instructed patient to go to the [facility] emergency room for evaluation if experiencing any acute safety concerns, to include suicidal intent.  He commits to safety. His HI/SI that he complains about are intrusive ego-dystonic obsessions and not true HI SI in that there's no desire or intent ever.  Disc Ozempic and Monjauro for weight loss.  Has had weight gain from meds as a contributor. Lost 45# with Wegovy and 150#.    Needs to keep up activity as much as possible for depression.  For severe constipation seeing GI  Had constipation before clozapine  and GI appt in June started Amitza  which helped some. Needs to push fluids.    Historically he has not done well with serotonergic medicines which would typically be used for obsessive thoughts.  But we are running out of options.   Repeat ECT not good option bc SZ for TR sx.   Disc memory concerns and mother's memory concerns bc had it before.  Disc counseling.  [email or web address] for therapist.  Most effective therapist will need to be expert in CBT.  He has one now.  No med changes at least until 6 mos post sz if possible.  FU 4 weeks   Nori Beat, MD, DFAPA  Future Appointments  Date Time Provider Department Center  10/24/2023  1:00 PM Cottle, Kennedy Peabody., MD [facility] None  11/15/2023  2:00 PM Cottle, Kennedy Peabody., MD [facility] None  12/29/2023  1:30 PM Lawerence Pressman, MD [facility] None    No orders of the defined types were placed in this encounter.       -------------------------------

## 2023-09-21 ENCOUNTER — Other Ambulatory Visit: Payer: Self-pay | Admitting: Psychiatry

## 2023-09-21 ENCOUNTER — Ambulatory Visit: Payer: Medicare PPO | Admitting: Psychiatry

## 2023-09-21 ENCOUNTER — Telehealth: Payer: Self-pay | Admitting: Psychiatry

## 2023-09-21 DIAGNOSIS — Z79899 Other long term (current) drug therapy: Secondary | ICD-10-CM

## 2023-09-21 NOTE — Telephone Encounter (Addendum)
 Order entered, printed, and mailed. Pt notified. Told him that mail had already gone out today, that it would go out tomorrow. Reminded him that Monday was a holiday.

## 2023-09-21 NOTE — Telephone Encounter (Signed)
 Pt called requesting lithium  level lab order mailed to home address. Forgot to get at [address]. Advise pt when mailed [phone]

## 2023-09-22 LAB — CBC WITH DIFFERENTIAL/PLATELET
Basophils Absolute: 0.1 10*3/uL (ref 0.0–0.2)
Basos: 2 %
EOS (ABSOLUTE): 0.4 10*3/uL (ref 0.0–0.4)
Eos: 8 %
Hematocrit: 38.7 % (ref 37.5–51.0)
Hemoglobin: 13.1 g/dL (ref 13.0–17.7)
Immature Grans (Abs): 0 10*3/uL (ref 0.0–0.1)
Immature Granulocytes: 0 %
Lymphocytes Absolute: 2.1 10*3/uL (ref 0.7–3.1)
Lymphs: 48 %
MCH: 31.3 pg (ref 26.6–33.0)
MCHC: 33.9 g/dL (ref 31.5–35.7)
MCV: 93 fL (ref 79–97)
Monocytes Absolute: 0.3 10*3/uL (ref 0.1–0.9)
Monocytes: 6 %
Neutrophils Absolute: 1.6 10*3/uL (ref 1.4–7.0)
Neutrophils: 36 %
Platelets: 376 10*3/uL (ref 150–450)
RBC: 4.18 x10E6/uL (ref 4.14–5.80)
RDW: 12 % (ref 11.6–15.4)
WBC: 4.5 10*3/uL (ref 3.4–10.8)

## 2023-09-22 LAB — SPECIMEN STATUS REPORT

## 2023-09-29 ENCOUNTER — Other Ambulatory Visit: Payer: Self-pay | Admitting: Psychiatry

## 2023-09-29 DIAGNOSIS — F411 Generalized anxiety disorder: Secondary | ICD-10-CM

## 2023-09-29 DIAGNOSIS — F314 Bipolar disorder, current episode depressed, severe, without psychotic features: Secondary | ICD-10-CM

## 2023-09-29 DIAGNOSIS — F4001 Agoraphobia with panic disorder: Secondary | ICD-10-CM

## 2023-09-29 DIAGNOSIS — F428 Other obsessive-compulsive disorder: Secondary | ICD-10-CM

## 2023-10-17 ENCOUNTER — Ambulatory Visit: Payer: Self-pay | Admitting: Psychiatry

## 2023-10-19 ENCOUNTER — Other Ambulatory Visit: Payer: Self-pay | Admitting: Psychiatry

## 2023-10-19 DIAGNOSIS — F314 Bipolar disorder, current episode depressed, severe, without psychotic features: Secondary | ICD-10-CM

## 2023-10-19 DIAGNOSIS — F4001 Agoraphobia with panic disorder: Secondary | ICD-10-CM

## 2023-10-19 DIAGNOSIS — F411 Generalized anxiety disorder: Secondary | ICD-10-CM

## 2023-10-19 DIAGNOSIS — F428 Other obsessive-compulsive disorder: Secondary | ICD-10-CM

## 2023-10-20 LAB — CBC WITH DIFFERENTIAL/PLATELET
Basophils Absolute: 0.1 10*3/uL (ref 0.0–0.2)
Basos: 2 %
EOS (ABSOLUTE): 0.4 10*3/uL (ref 0.0–0.4)
Eos: 8 %
Hematocrit: 39.8 % (ref 37.5–51.0)
Hemoglobin: 12.8 g/dL — ABNORMAL LOW (ref 13.0–17.7)
Immature Grans (Abs): 0 10*3/uL (ref 0.0–0.1)
Immature Granulocytes: 0 %
Lymphocytes Absolute: 2.1 10*3/uL (ref 0.7–3.1)
Lymphs: 50 %
MCH: 30.3 pg (ref 26.6–33.0)
MCHC: 32.2 g/dL (ref 31.5–35.7)
MCV: 94 fL (ref 79–97)
Monocytes Absolute: 0.3 10*3/uL (ref 0.1–0.9)
Monocytes: 6 %
Neutrophils Absolute: 1.5 10*3/uL (ref 1.4–7.0)
Neutrophils: 34 %
Platelets: 395 10*3/uL (ref 150–450)
RBC: 4.22 x10E6/uL (ref 4.14–5.80)
RDW: 12.4 % (ref 11.6–15.4)
WBC: 4.4 10*3/uL (ref 3.4–10.8)

## 2023-10-21 ENCOUNTER — Other Ambulatory Visit: Payer: Self-pay | Admitting: Psychiatry

## 2023-10-21 DIAGNOSIS — F428 Other obsessive-compulsive disorder: Secondary | ICD-10-CM

## 2023-10-21 DIAGNOSIS — F314 Bipolar disorder, current episode depressed, severe, without psychotic features: Secondary | ICD-10-CM

## 2023-10-21 DIAGNOSIS — F4001 Agoraphobia with panic disorder: Secondary | ICD-10-CM

## 2023-10-21 DIAGNOSIS — F411 Generalized anxiety disorder: Secondary | ICD-10-CM

## 2023-10-21 NOTE — Telephone Encounter (Signed)
 Talked to pharmacy and I think the issue is that he doesn't have any RF on file.

## 2023-10-21 NOTE — Telephone Encounter (Signed)
 LF 5/30, due 6/27. I have refused RF once because it is too early. I called patient and he asks what should he do when he needs the medication. I told him the pharmacy filled enough on 5/30 to get him to his next RF, that it was a week too early for a RF.

## 2023-10-24 ENCOUNTER — Encounter: Payer: Self-pay | Admitting: Psychiatry

## 2023-10-24 ENCOUNTER — Ambulatory Visit: Admitting: Psychiatry

## 2023-10-24 DIAGNOSIS — F411 Generalized anxiety disorder: Secondary | ICD-10-CM | POA: Diagnosis not present

## 2023-10-24 DIAGNOSIS — E538 Deficiency of other specified B group vitamins: Secondary | ICD-10-CM

## 2023-10-24 DIAGNOSIS — F428 Other obsessive-compulsive disorder: Secondary | ICD-10-CM

## 2023-10-24 DIAGNOSIS — F5105 Insomnia due to other mental disorder: Secondary | ICD-10-CM | POA: Diagnosis not present

## 2023-10-24 DIAGNOSIS — F314 Bipolar disorder, current episode depressed, severe, without psychotic features: Secondary | ICD-10-CM | POA: Diagnosis not present

## 2023-10-24 DIAGNOSIS — Z79899 Other long term (current) drug therapy: Secondary | ICD-10-CM

## 2023-10-24 DIAGNOSIS — K5903 Drug induced constipation: Secondary | ICD-10-CM

## 2023-10-24 DIAGNOSIS — F4001 Agoraphobia with panic disorder: Secondary | ICD-10-CM | POA: Diagnosis not present

## 2023-10-24 DIAGNOSIS — R569 Unspecified convulsions: Secondary | ICD-10-CM

## 2023-10-24 DIAGNOSIS — G3184 Mild cognitive impairment, so stated: Secondary | ICD-10-CM

## 2023-10-24 DIAGNOSIS — K117 Disturbances of salivary secretion: Secondary | ICD-10-CM

## 2023-10-24 MED ORDER — TRAZODONE HCL 50 MG PO TABS
50.0000 mg | ORAL_TABLET | Freq: Every evening | ORAL | 0 refills | Status: DC | PRN
Start: 2023-10-24 — End: 2023-12-14

## 2023-10-24 NOTE — Progress Notes (Signed)
 Albert [name] [id number] 1965-08-03 58 y.o.    Subjective:   Patient ID:  Albert [name] is a 58 y.o. (DOB October 08, 1965) male.  Chief Complaint:  Chief Complaint  Patient presents with   Follow-up   Depression   Anxiety   Fatigue      Albert [name] presents to the office today for follow-up of mixed bipolar, anxiety and still grieving stress of breakup.  He requires frequent follow-up because of long-term symptoms.  At visit October 25, 2018.  We made several medicine changes because of his concerns about sleep and other issues.  We increased olanzapine  back to 7.5 mg bc not sleeping as well at 5 mg.  He has to have the prescription for quetiapine  written for up to 3 tablets at night in case insomnia is worse because insomnia makes his mood disorder so much worse.  We discussed that was above the usual max but the request was granted given his treatment resistant status. We also reduce lithium  from 900 mg daily to 750 mg daily to try to reduce tremor and muscle twitches.  At  visit March 28, 2019.  Fluoxetine  was increased to 40 mg daily.   Early January 2021 increased to 60 mg daily.  No SE.  seen June 28, 2019.  No further meds were changed except olanzapine  was increased back to 10 mg daily to see if if he could get additional benefit per his request.  Covid vaccinated.  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  Then had cholecystectomy.  April 2021 appt with the following noted: 2 episodes night sweats lately.  Memory has been very bad lately.  Repeatedly asks mother questions. Still  tend to stay in his room and his bed.  Still has rapid cycling mood swings.  Maybe some better with increase in olanzapine  to 10 and tolerating itl.  Would like to get out more but can't DT Covid.  Can perform necessary chores.  Will get out of the house when he can.  No change in death thoughts and anxiety in intensity but is better with frequency.  Usually comes and goes in waves but more persistent.  Consistent  with meds.  Ativan  not helping anxiety very dramatically but he's not sure.  Fidgety.  No trigger other than still grieving relationship and can't get it out of his head.  Poor energy, concentration and more forgetful. In bed more and less active.  Sleep ok lately which is unusual.  Can concentrate on financial matters and stock market at times.  Tolerating meds.  Still some intrusive SI without reason. Less frequent obsessive thoughts about broken relationship and has been doing this for months.  Can't let it go.  Loop.   No unusual stress even with the family who is supportive. Likes the benefit that Zyprexa  gives. No sleepwalking nor falling nor odd behavior.  No history of sleep walking.  He understands this may recur with an increase.  Also wants option to rarely take extra quetiapine  300 for sleep prn. Plan:  Try to reduce lorazepam  if possible.  10/22/2019 appointment with the following noted: Continues fluoxetine  60, lithium  600 mg, lorazepam  2 mg AM and HS and 1 mg midday, olanzapine  10, quetiapine  600 mg HS. Still anxious chronically and including driving in crowded spaces.  Doesn't thing Ativan  helps as well as Xanax  but less cognitive problems. Sleep is not as good.  Occ EFA.  More EMA and wanting to do things in the middle of the  night but this is not typical.  Wonders about why that happens.  Some napping.   Tolerating meds.  Asks about weight loss meds.  Disc this in detail.   Plan no changes except OK meclizine  prn vertigo.  02/11/20 appt with the following noted: Able to gradually reduce lorazepam  to 1 mg AM and HS. More depressed over time and less interested in things and less interest in going out but does with his parents. Has reduced from 800 to 600 mg HS with some awakening but usually able to go back to sleep.Albert [name]  Spending a good amount of time in bed bc watches TV in bedroom.  Parents watch TV in different part of the house.  No mood swings he notices.   Concerns about weight  gain about 200#. Likes olanzapine 's benefit for sleep. Plan: Trial for TRD to  Increase fluoxetine  to 80 mg daily  to use the combo with olanzapine  for TR bipolar depression.   04/21/20 appt with following noted: I thought in beginning some benefit with fluoxetine .  Lifelong negative thinking continues.   Not much bipolar.  Reads a lot on bipolar.   Still tired and anxious a little more may be seasonal.  No familial stressors.  Tends to lay down in afternoon.  Anxiety not over anything in particular.   No SE with fluoxetine . Took meclizine  prn.  Easily motion sick.    Asks questions about newer drugs for bipolar like Caplyta . Plan:  No med changes  06/30/20 appt noted: Tired of wearing masks.  Vaccinated.  Asked questions about when this will end with mask mandate.  Mood up and down some since here and getting up 2-3 times.  Disc awakening problems.  Trying to do better bc night eating some.   Mood is about average today so far.  [name] out with friends for breakfast.  Recognizes activity helps mood.   3 days in a row napped a lot in the afternoon.   Bed is a comfort place.  Gets bored and hard to motivate.  Tries to stay away from night eating and spending.   More depressed when alone and wonders about med changes. Plan: Failed response to olanzapine  + fluoxetine  for TR bipolar depression.   Reduce fluoxetine  to 1 daily and reduce olanzapine  to one half nightly for 10 days and then stop it. Then start Caplyta  1 daily  08/27/2020 appointment with the following noted: Patient decompensated with the above switch to Caplyta  with intrusive suicidal thoughts and had to be hospitalized for psychiatric reasons.  Multiple phone calls with family members since that time.  Spoke with the psychiatric nurse practitioner with the decision to restart olanzapine  in place of the Caplyta  given the patient was more stable while on the combination of Seroquel  and olanzapine  than with the Caplyta . Caplyta  triggered  HI/SI and depressed and confused on it, and agitated and still has some of it now. No akathisia. Frustrated with lack of therapy at the hospital.   Hydroxyzine  didn't help jitteriness.  Feels some better.  Fleeting SI & HI but not obsessive like it was prior to hospitalization.  Feels a little amped up and nervous.  Scared of what could happen.  Apprehensive being here talking about things. 5-6 hours sleep last night and would like to have more. At mom and dad's house right now and up more in the day. Inadvertently stopped lorazepam  abruptly by mistake likely contributing to shakiness. Still some memory issues.  Trying to stay out of bed  when watching TV.  Some awakening but managing. Occ fleeting SI and distracts himself.   Always spends a lot of time just laying around.   Can have anxiety for no reason like coming here, and varies in intensity without pattern.      Less panic than in the past.  Some chronic depression and hyperactivity and loudness and hyperverbal.  Usually back to sleep.  Total 6 hours but some napping, awakens 2-4 times nightly but back to sleep..  Taking quetiapine  600 mg HS.  still lacks interest and motivation.  Can follow a TV show if interested. Plan: Restart lorazepam  1 mg in the morning, 1 mg in the afternoon and 1 mg at night for anxiety Stop hydroxyzine   Increase olanzapine  to 1 and 1/2 of 10 mg tablets in evening  09/05/2020 appt noted: seen with parents today at his request Made med changes noted and tolerated the changes Better than I did last week. Now only fleeting HI/SI and no where near what it was.  Sleeping better.  Still not a lot of energy.  Anhedonia.  Less agitation.  A lot of anxiety all the time but it's helping.  Would like more energy and motivation.   Doesn't handle stress well. Parents note he's less shakey.  He's still staying with his parents.  Only driven once since this happened and F wants him to be able to drive and function more independently.   M agrees he's better.  Memory is better but not normal.  Primarily STM problems No akathisia with olanzapine  this time.    Administering his own meds but mo watched. Slept 8 hours last night. Plan consider further reductions in quetiapine   10/17/20 appt noted: Stayed on Seroquel  600 HS and olanzapine  15. Olanzapine  seemed to do most for the sleep.  Also helping eliminated HI/SI for the most part.  Wants to try to increase it.  Depression is much better with less rumination also.  Fears akathisia at higher dose as in the past. Also on fluoxetine  40.   Plan: Reduce Seroquel  to 1 and 1/2 tablets at night Increase olanzapine  to 1 of the 15 mg tablet and 1/2 of the 5 mg tablet for 1 week,  Then, if tolerated increase the olanzapine  to 20 mg or 1 of the 15 mg tablets and 1 of the 5 mg tablets. If possible then also reduce quetiapine  to 1 tablet at night.  11/05/2020 appointment with the following noted: Up to olanzapine  20 mg hs for 3 days.  Reduced Serooquel to 450 mg HS.  Didn't try to go lower. Had hiatal hernia and umbilical hernia surgery recently with no problem.   No akathisia so far. Sleep ok with meds so far changes.  No mood changes yet but overall likes the smoothness of olanzapine  and helping sleep without knociing him out. Still some occ HI/SI, he thinks bc of anxiety generally.  Easily anxious. Plan: Continue olanzapine  20 mg nightly longer to give it time to help mood sx reduce quetiapine  to 1 tablet at night to minimize polypharmacy and reduce risk of akathisia.  01/07/21 appt noted: Able to reduce quetiapine  300 mg HS ok with decent sleep but still wakes a lot.  No AM hangover.  Don't have a lot to do.  Can enjoy going out and doing things.  But doesn't do it longterm.  Family is doing good.  Father started year 26 at his job.   Tolerating the meds well.  May wake more with quetiapine  reduction.  Lost 10#. Wonders if could try other meds he didn't tolerate before bc now can tolerate  olanzapine  and couldn't tolerate it in the past DT akathisia. Mood never great but can swing into depresssion without reason.   Plan: Continue olanzapine  20 mg nightly longer to give it time to help mood sx reduce quetiapine  to 1/2 of the 300 mg  tablet at night to minimize polypharmacy and reduce risk of akathisia.  02/02/2021 appointment with the following noted: Tried reduced quetiapine  to 150 mg and after a week had withdrawal sx including jittery and SI and he increased to 250 mg daily.  Felt better after increasing to 300 mg daily and last week reduced to 250 mg daily.  After increase quetiapine  felt better in a few days.  Seems more alert with less quetiapine  but can't tolerate a quick withdrawal.   Some awakening.  Plan: Reduce quetiapine  by 50 mg every 2-4 weeks.  03/09/2021 phone call: Pt called and said that he is weaning off the seroquel . He was down to 200 mg of the seoquel and he started having withdrawal symptoms and sucidal thoughts. He went back up to 250 mg because he knew he was good on that.    Pt stated he starting back taking 250 mg on Saturday due to the symptoms he was having.Now he is no longer having suicidal thoughts but still very jittery. MD: He may just need to taper slower than most people.  If he can tolerate the 250 mg Seroquel  with the jitteriness it should get better over the next 1 to 2 weeks.  Then he can try going down again by 50 mg at a time.  He probably needs to go down by just 50 mg every 2 to 4 weeks and wait till he fully adjusts to each reduction before he reduces again.  03/17/21 appt noted: Hernia surgery 2 weeks ago. Seroquel  250 mg for a month then 200 mg daily and had SI and increased again to 300 mg daily.  Would love to get off it.  Withdrawal triggers intrusive HI/SI but otherwise doesn't have them No SE. Sleep is OK but not enough REM sleep.  Satisfied with other meds.   No current HI/SI.  No paranoia.  Still has anxiety and taking Ativan  every  6 hours.  It works but doesn't cure it. Chronic anxiety and ask about ketamine for chronic depression. Plan: Because he had WD reducing from 250 to 200 mg daily will have to go slowly. Reduce quetiapine  by 25 mg every 2-4 weeks. Reduce Seroquel  to 250 mg for 2 weeks, Then reduce to 225 mg for 2 weeks, Then reduce to 200 mg for 2 weeks, Then reduce to 175 mg for 2 weeks, Then reduce to 150 mg Also: Buspirone  30 mg tablets for anxiety Start 1/3 tablet twice daily for 1 week Then increase to two thirds twice daily for 1 week Then increase to 1 tablet twice daily  04/15/2021 appointment with the following noted: Reduced Seroquel  to 200 mg HS and increased buspirone  to 30 mg BID STM px and wants to try to reduce Ativan  to see if it is better.  But fears with drawal sx. Parents complain about his memory. Less HI and SI since here. Less depressed. Plan: Buspirone  30 mg tablets for anxiety Start 1/3 tablet twice daily for 1 week Then increase to two thirds twice daily for 1 week Then increase to 1 tablet twice daily Continue olanzapine  20 mg nightly longer to give it  time to help mood sx Because he had WD reducing from 250 to 200 mg daily will have to go slowly. Reduce quetiapine  by 25 mg every 2-4 weeks. Reduce Seroquel  to 250 mg for 2 weeks, Then reduce to 225 mg for 2 weeks, Then reduce to 200 mg for 2 weeks, Then reduce to 175 mg for 2 weeks, Then reduce to 150 mg Trial taper lorazepam : In hopes of improving short-term memory  04/30/2022 phone call: Complaining of withdrawal symptoms reducing lorazepam  from 0.5 mg 5 times a day to 0.5 mg 4 times daily.  05/21/2021 appointment with the following noted: Gotten onto buspar  30 BID and down on lorazepam  to 0.5 mg QID. Gotten down to Seroquel  200 mg HS A little restless every now and then but able to reduce lorazepam  for the last weeks. Is too inactive and lays in bed too much watching TV.   Can' t tell effect of buspirone  bc of other  changes.  Sleep with awakening with 6-7 hours. Fleeting SI without trigger or plan or intent. Depressed more than anxious. Enjoys watching stocks and investing. Plan: Trial taper lorazepam  gradually over time In hopes of improving short-term memory But for now continue lorazepam  0.5 mg QID Start clonidine  0.1 mg tablets one half at night for 3 nights, then 1/2 tablet twice daily for 3 nights, then one half in the morning and 1 tablet at night If clonidine  does not noticeably help anxiety call the office  05/27/2021 phone call initially complaining of homicidal and suicidal thoughts after taking 1 mg of clonidine .  He was told to stop the medication.  But then called back stating he was having homicidal and suicidal thoughts from withdrawal from the medication which was not logical.  He agreed it was not logical and stated on 05/29/2021 that he felt better  06/23/2021 appointment the following noted: No clonidine . Sunday panic attack eating out and then having SI.  Stayed with father to feel safer. Intrusive thoughts of hurting parents or others.  HI only when has SI.  Usually panic without SI.  Then had intrusive thoughts about hurting others.   Usually intrusive thoughts are brief and fleeting.    07/20/21  appt noted; More anxious with less lithium .  More anxious in his body.  Not more depressed or irritable.  Sleep is the same.  More racing thoughts.  Chronic SI probably a litte worse but not unmanageable. Rduced lithium  last week to 300 mg daily. No recent change in lisinopril . Off the clonidine  for a week or so. Doesn't drink much water.  Drinks a lot of diet coke. No marked akathisia or RLS but does shake his leg DT anxiety. Worries about needing mor emedicine. Plan: Increase olanzapine  to 25 mg nightly bc more racing thoughts and anxiety with less lithium  DT severity of sx and difficulty getting off Seroquel  conside rincrease.  07/30/21 TC : CO racing SI today and wonders about whether  increased sx racing thoughts, hyperactivity, SI related to Reduced lithium  from 600 mg daily to 300 mg daily recently DT lithium  level 1.8.  could be related.    Plan: increase olanzapine  30 mg pm Today take lithium  600 mg now.  Tomorrow start lithium  CR 450 mg daily Continue olanzapine  30 mg PM until SI resolves unless akathisia returns.  Lorene Macintosh, MD, DFAPA  08/17/21 appt noted:  No akathisia with olanzapine  30 mg pm (about 3 weeks) and is sleeping better. Increased lithium  to 450 mg daily. Tolerating meds. Still racing negative  thoughts maybe some better.  Asked about it. SI come and go.   Anxiety about the same as depression.  No motivation unless has to do something. Plan: Reduce  fluoxetine  20 mg 1 daily in hopes racing thoughts and anxiety are better.  09/14/2021 appointment with the following noted: No difference noted with reduction fluoxetine  20 mg daily. Most of the time negative thoughts, ex what if I get in a wreck?  Frequent negative thoughts.   Still on lorazepam  0.5 mg TID.   Wonders about trying prior meds that caused akathisia bc no longer gets it with olanzapine  30 mg daily. Sleep is usually ok.  Taking Seroquel  200 mg HS.  Thinks the olanzapine  helps his sleep and doesn't want to stop it. Panic 1-2 times per month.  Then takes extra lorazepam . Plan: DC fluoxetine  20 mg 1 daily  Trial Auvelity off label for depression 1 in the AM for 1 week then 1 in AM and 1 with evening meal  10/15/2021 appointment with the following noted: Occ of getting confused as to day and went to church on a Thursday my mistake.   Had it happen a few months ago too.  Memory is terrible. Thinks he couldn't tolerate more Auvelity. Not a lot of benefit from [place] 1 daily. Still quite a bit of anxiety.   A little drowsy  and sleeps 7-8 hours at night. Word finding problems.  Lost wallet. Plan: DC  Auvelity  For [facility]: memantine  1/2 tablet in the AM for 1 week, then 1/2 tablet twice daily,  then 1/2 tablet in the AM and 1 tablet at night for 1 week then 1 tablet twice daily  10/22/2021 phone call complaining of additional stress asking to start new medication because of anxiety and more suicidal thoughts without intent or plan.  Reports taking more than 4 prescribed Ativan  a day recently.  Asking to increase olanzapine  which is already at 30 mg daily. MD response:We cannot go any higher in the dosage of olanzapine  that he is currently taking.  A lot of these suicidal thoughts are apparently anxiety driven.  Let us  have him resume taking paroxetine  which is one of the more effective medicines for anxiety.  I will send it in and the instructions will be paroxetine  20 mg tablets 1/2 tablet daily for 1 week then 1 tablet daily  11/16/21 appt noted: Multiple phone calls since she was here.  Complaining of suicidal thoughts from memantine  which was stopped. Started paroxetine  and up to 20 mg daily.  Continues olanzapine  30 mg, lithium  450 mg nightly, lorazepam  0.5 mg 4 times daily , And quetiapine  200 mg nightly. Questions about whether memantine  caused the neg SI really bc has them at times. When has SI often anxiety driven.  Will tend to ruminate on past relationship ended.   Edgy and irritable all the time but memantine  seemed to make it worse. Mind is getting better with less SI lately.   No SE with paroxetine .   Aggrivated by forgetfulness.  eXample, talked with someone about going to breakfast last night and then forgot to go this mornin.g. Plan prescribed donepezil  5 mg daily for cognitive complaints specifically forgetfulness  12/31/2021 appointment with the following noted: He had been prescribed donepezil  5 mg for cognitive complaints.  He called back reporting he felt it was causing homicidal thoughts and was instructed to stop it.  He has had these types of thoughts in the past.  He had no desire to act on them.  Discouraged. Ongoing depression and reduced enjoyment and negative  thoughts.  Can enjoy some things. A lot of anxiety chronically.  Trying to limit Ativan  to 5 of 0.5 mg daily.  Sometimes needs 2. Constipation for years.  02/03/22 appt noted: Depression makes LBP worse. Ongoing chronic depression. Tolerated rivastigmine  without triggering SI Chronic anxiety and Ativan  helps some. Occ panic but not usual.  Asks if any other meds coud be used. Plan: But for now continue lorazepam  0.5 mg QID, but use LED for cog reasons Increase rivastigmine  to 3 mg capsule 1 twice daily or 2 of the 1.5 mg capsules twice daily.  Call if you have problems with nausea Start dextromethorphan  1 capsule twice daily . Thi sis off label for depression based on MOA of Auvelity and use of paroxetine  to prolong half life of DM. Presence of paroxetine  may have been reason he didn't tolerate Auvelity brief trial before.  Will proceed slowly.  03/17/2022 appointment noted: Current psych meds: Rivastigmine  3 mg twice daily, quetiapine  200 mg nightly, paroxetine  40 mg daily, olanzapine  30 mg nightly, Namenda  10 mg daily, lorazepam  0.5 mg 4 times daily, lithium  CR 450 mg nightly, dextromethorphan  15 mg twice daily A lot of anxiety since here.  Some days taken more lorazepam  to 6-7 tablets daily.  No major triggers but anxious even about coming here and other normal things.  Cancelled trip to collect rocks with friend and cancelled the trip bc anxious.  Usually if can go he is ok.  Lots of SI ongoing Memory might be a little better. Asks how he responded to Vraylar.  Will check paper chart.  03/24/22 TC:  CO SI worse and restilless with Rivastigmine  9 mg . MD response:  Stop the rivastigmine  since the [place] appears to be having side effects from it.  He was having anxiety at the last appointment 2 and then we increased the dose.  This might be causing the anxiety to be worse. He has chronic suicidal thoughts without intent or plan.  However it appears the thoughts are more intense with the  rivastigmine      05/06/21 appt noted: Stopped rivastigmine  DT anxiety and worsening SI.  Was better the next day. This week had some SI.  Thinking about the prior relationship tends to lead to SI. Will take Ativan  when gets SI and it helps a little at 0.5 mg at a time. Plan: Consider fluvoxamine .  Yes this may help with obsessions on old GF and he failed other optioons. Start fluvoxamine  1/2 of 50 mg tablet and reduce paroxetine  to 1 and 1/2 of 20 mg tablets for 5 days, Then increase fluvoxamine  to 1 tablet daily and reduce paroxetine  to 1 tablet daily for 5 days, Then increase fluvoxamine  to 1-1/2 tablets daily and reduce paroxetine  to 1/2 tablet daily for 5 days, Then increase fluvoxamine  to 2 tablets daily and stop paroxetine    06/07/22 appt noted: Feels like the fluvoxamine  50 BID is too much.  On this a couple of weeks. Worries he might be worse with this with regard to SI.    He thinks it's worse with increase.   New neighbor who has a young boy. He worries that Georjean is triggering depression and intrusive SI/HI a couple of days later and lasts a little while. Has had anxiety turning into panic attacks.  This gets associated with depression and SI/HI.  These thoughts are intrusive and he doesn't want to act on them.  Can have HI even towards family with  whom he has no negative emotions.  Little things like a bump up in parking lot sets off his depression and anxiety and doesn't seem to handle things.   No sig akathisia.  In the past it's been continuous.   Awakens every 2-3 hours.  Usually goes back to sleep with meds. Has not had alcohol in 20 years and had some intrusive thoughts without drinking.   Plan: As soon as he feels comfortable increase fluvoxamine  to 50 mg AM and 100 mg PM for intrusive thoughts of HI/SI and obs on GF  07/06/22 appt noted: Increased fluvoxamine  to 150 mg daily and felt more agitated and some SI so reduced to 100 mg 4 days ago and feels better so far.  Likes the  fact it is good for anxiety and obs thoughts.  Wonders if there is a similar med.  Frustrated he can't get relief.  Some improvement in obs on exGF but not resolved. Continue other meds: lithium  CR 450 mg HS, lorazepam  0.5 mg QID, Memantine  10 BID, olanzapine  30 mg HS, quetiapine  200 mg HS Tingling and burning in legs at night but doesn't interfere with sleep. Got insurance to pay for [facility] and lost 13#.   No full panic lately. But some anxiety waves.   Still major problems with memory. Plan: Wean fluvoxamine  50 mg daily for a month then 1/2 daily for 2 weeks then stop it DT  NR.  Watch for more anxiety.  07/28/22 TC: Con Dawna DASEN, CMA  to Me     07/28/22  4:16 PM Note Mom called reporting patient has been confused, dizzy, and sluggish for the last 3 days. He is now staying with her. She had several questions in regards to this:   Lithium  level - she feels he needs one done, last result in Epic was 08/2021 that I see. She would like order sent to [facility] for tomorrow.   Patient is on Wegovy, has been for about a month, and she wonders if this could cause sx.   Patient has meclizine  and she is questioning if she should give that for his dizziness considering other sx.   She questions if perhaps he got up during the night and took additional medications, though doesn't know if this happened and if so what he took.    On 3/5 visit he was to wean fluvoxamine  to 50 mg qd for a month and then 1/2 tab for 2 weeks and then stop. Mom and pt unsure if he started the wean.    He has F/U 4/4.        Me  to Con Dawna DASEN, CMA   CC   07/28/22  5:44 PM Note Yes get lithium  level in the AM and don't take any lithium  tonight or in the morning before the blood test.     Georjean should not cause these sx except if he is losing wt quickly or is dehydrated it can cause lithium  toxicity and include these sx.  He needs to drink lots of water.   He should go ahead and wean the fluvoxamine  as  instructed in the last appt.   If the sx get worse, go to the ER.    Me   CC   07/29/22  6:49 PM Note RTC   Sx not as severe as when he had to go to hospital in the past.  [name] got Lithium  level today.   Speech is not good .  Confused at times, but  better today.  A little tremor .  A little balance problems.  No falls.  No diarrhea Took lihtium 450 last night. Mo says he got up in the middle of the night and moved things around last night and he doesn't rmember.   No change in night time meds.   Gradually reducing fluvoxamine .   No extra lorazepam .     Spoke with mother who verifies the same.   No med changes but push fluids.   Lorene Macintosh, MD, DFAPA      08/05/22 appt : with parents Pt hosp 08/02/22 with acute kidney injury and somewhat elevated lithium  level with some confusion.  Spoke with psych NP at hosp and lithium  was decreased to 450 mg HS. No pending appt known with medical doctors about kidney function. Don't feel good and having HI and SI and feels he needs lithium  for these issues.  Doesn't feel he should have been discharged.   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 No added meds at night but has been confused at night and getting up multiple times and doing things he doesn't remember.  This concerns parents.  Parents been up at night with him and he doesn't make sense.  Still groggy in the am per mo for an hour.   Plan: Plan: Get lithium  level and kidney test ASAP Stop fluvoxamine  Reduce lithium  300 mg capsule 1 at night Monday night: reduce to olanzapine  to 20 mg at night (stop the 10 mg tablet) and start clozapine  prescription. Monday 4/15 : increase clozapine  to 100 mg at night and reduce quetiapine  to 1/2 of 200 mg tablet and continue olanzapine  20 mg each evening.  08/01/22 & 08/11/22 ED for HI and SI  08/12/22 TC mother:  Mother, Patsy called. Per note made by Harlene Franchot PIETY. He was referred to [facility].Reporting that he did go up there last night. This morning they  called her and told her to come pick him up. They told her he wasn't having homicidal or suicidal thoughts and it would be four to five days of him waiting to go to a facility in [place]. They recommended him for PHP to start 4/16. She picked him up today. He is worried about these thoughts coming back up. Also, [facility] came out yesterday to assess him and offered counseling from [facility]. He wants to do counseling with them.  Any input on care he can receive?  He is so discouraged and wants to be safe.  (She notes that the [facility] gave him- Zyprexa  10mg  at 1:07am and again at 1:25am per the med list- based on his previous med list) He was supposed to be off of Zyprexa10mg . Has been on  Clozapine  since Monday. He did go ahead and take it before he left ( 2pills) so he hasn't missed a dose and did go get labwork done. Is wanting to continue the Clozapine , please check the labwork to be able to fill the 100mg  dose.    MD resp:  Continue all meds as RX except go ahead and increase clozapine  to 100 mg tablets, 1 each night as soon as he can get the 100 mg tablets from pharmacy.  RX sent.  Trying to speed up his chance of response by getting it to a hgiher dose.  Clozapine  is very effective for HI/SI.  If he gets worsening HI/SI in the interim it will not be caused by meds but just the natural waxing and waining of his sx.  Once clozapine  dose gets high  enough it should help but avg patient needs 300 mg so we have to just be patient.      08/20/22 appt:  Up to 100 mg clozapine  HS SE constipation and using Colace, Miralax .  Been to [facility] twice  since here.   Ongoing continuous HI/SI.   All I want to do is go to sleep to escape.  Dep causing the thoughts.   Taking Ativan  0.5 mg BID-QID.  He thinks mother not giving him enough of it.  He thinks anxiety triggers HI/SI.  He fears HI and hopes he's strong enough to reisist.  Not angry or wanting to hurt anyone in reality but has the intrusive thoughts. Conc and memory still not  good.   Sleep not much different.   Staying with parents currently. Dep gotten worse M worrying he's dependent on lorazepam .   Confusion at night is no longer happening.  Not wandering at night.  Plan: For severe constipation start Linzess  145 mg AM Get lithium  level and kidney test ASAP Stopped fluvoxamine  and confusion at night is gone  lithium  300 mg capsule 1 at night  olanzapine  to 20 mg at night  increase clozapine  to 150 mg at night for 3 nights then 200 mg HS   olanzapine  20 mg each evening. Continue lorazepam  0.5 mg QID, needs this and informed mother  09/07/22 appt noted: Psych meds: clozapine  250 MG HS, lithium  300 mg HS, lorazepam  0.5 mg BID-QID, olanzapine  20 .  Off Seroquel , linzess  290 mg daily, Miralax , Colace. Sleep 7-8 hours without change from before clozapine .   Mood is still very depressed and still gets anxiety. Still problems with constipation.   Dep is so much it is causing intrusive HI/SI.   No more bizarre behaviors at night.   Plan: Plan: Get lithium  level and kidney test ASAP  lithium  300 mg capsule 1 at night  olanzapine  to 20 mg at night  increase clozapine  to  300 mg HS as soon as tolerated and then 400 mg HS.  olanzapine  20 mg each evening. Continue lorazepam  0.5 mg QID, needs this and informed mother  10/07/22 appt noted: alone and with parents Rough month.  Altered voice for a couple of weeks or so..  Mind not good.  Feel out of sorts.  Jittery.  Constipation.  No appetite.  On Wegovy.  Saw GI doc.   May need to reduce Wegovy bc poor appetite and constipation.  Lost 25 #. Current psych med: clozapine  400 mg HS about a week or 2, olanzapine  20 mg HS, lithium  300 mg HS, lorazepam  0.5 mg QID. Reduced intrusive thoughts.  2 nights since here night eating.   Sleep 6 hours.  Limited napping during the day.   Anhedonia.  Lower motivation.  Tremor worse. M says he sleeps pretty well but awakens  BP is under control. Plan Reduce lithium  to 150 mg capsule  1 at night DT level higher Reduce  olanzapine  to 10 mg at night  Continue clozapine  400 mg HS bc helping SI and HI and  Continue lorazepam  0.5 mg QID, needs this and informed mother  11/08/22 appt noted:  alone and with parents. Ongoing calls between visits Doesn't think clozapine  helping him sleep as well as olanzapine  for sleep or any other benefit. While at restaurant had intrusive HI/SI. Staying with parents seems to provoke intrusive HI.  Is grateful to them.  Sat woke up with them.  Sometimes goes away and sometimes not.  No know trigger and no  intent. Saw PCP.  Off Wegovy.  Started metformin . No effect noted from clozapine .   M limits his lorazepam .  PCP referring him to neuro to FU abnormal head scan.  He wonders if his speech and memory problems are related.   Plan: Cont clozapine  400 mg HS Check clozapine  level and then direct dosing.  11/16/22 TC from pt to disc level:  MD resp:  Clozapine  level ok but may benefit with increase to 500 mg nightly.  Tell him to increase with current supply.     12/06/22 TC from pt:  complaining of worsening intrusive ego-dystonic HI/SI without intent, desire or plan.   MD resp ok to increase clozapine  to 600 mg HS.  12/09/22 appt noted: Complaining of ongoing racing thoughts and worsening HI/SI without intent or desire or plan.  Racing thoughts chronic and other thoughts wax and wane.  Usually triggered if starts thinking of something negative Meds: clozapine  600 mg HS, lithium  150, lorazepam  0.5 mg QID, olanzapine  10 HS Sleep with EFA.   Will have trouble going to sleep sometimes with thoughts. Doesn't think clozapine  makes him sleepy.  Not really drowsy daytime. No dx OSA and no known apnea.  SE a little dizzy.  Constipation managed with BM every 2-3 days. Better than before.  PCP RX metformin  and it helped. Plan: Plan for intrusive HI/SI wean olanzapine  and trial of risperidone .  Reduce olanzapine  to 5 mg HS for 1 week then stop it.  Addressed  his fears of not sleeping off olanzapine . Then if sleeping start risperidone .  01/11/23 appt noted:  also seen with parents Psych meds: clozapine  600, lorazepam  0.5 mg QID, No olanzpaine ,  lithium  150 mg daily, no risperidone  yet. Lost 45# on Wegovy down to 150#.   Still having intrusive HI/SI as noted before. Sleep no worse.  Takes a couple of hours after clozapine  to sleep.  EFA 2-3 times nocturia.   Still racing thoughts.  Ongoing px with conc, memory,  planning Scored 73 on 100 pt test for cognition.   Some trouble with articulation and speech at times.   If could get rid of racing and intrusive thoughts I'd be 100% better. Neuro says no stroke history and low B12 and workup ongoing at [facility], Dr. IZORA Fairly. Memory is still terrible.   Plan: Start risperidone  2 mg tablet, 1 at night for racing thoughts and intrusive homicidal and suicidal thoughts. Call in 3 weeks if not better and we will increase the dose  02/09/23 appt: seen also with parents , 60 min  Lost to 152#. Meds: as above, risperidone  incr to 4.5 mg HS for intrusive HI/SI SE drooling off an on.  Tremor mainly worse with a little more risperidone  HI/SI intrusive thoughts wax and wane.  Worse when lays down. Takes 2 hours to fall asleep.  Needs to get OOB when awakens.  6 hours sleep. No falls.   Would rather try to increase risperidone  than other options.    04/12/23 appt virtual:  seen alone CC:  Multiple phone calls about ongoing intrusive, obsessive, ego dystonic HI/SI thoughts without plan or intent. Didn't come to the office bc of these thoughts.  I know I don't want to do it.  Can last four hours.  Sees drug advertisements that talk about SI and that will trigger obsessions about it. Meds: clozapine  600 mg HS,  lithium  150 mg HS, lorazepam  0.5 mg TID, risperidone  increased to 8 mg and now back to 4mg  HS.   Neuro  sched MRI tomorrow. Enough sleep 5-6 hours at night.  Not napping.   Sometimes gets confused with med but  not ususally.   No recent mania.   Depressed and anxious. Plan: AVS: Reduce risperidone  to 1/2 of 4 mg tablet in evening for 1 week then stop it. Reduce clozapine  to 5 tablets nightly Start fluvoxamine  100 mg tablet 1/2 tablet nightly for 1 week then 1 tablet nightly for obsessive, intrusive thoughts  05/25/23 appt in person:  not with parents, met with [facility] with 2 sz and then had psych hosp since here.  Another EEG 06/03/23.  Neuro DR.  Jhonny FATE Keppra .  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 Psych med: clozapine  reduced to 300 HS, stopped ? Risperidone  he's not sure, clonazepam  0.5 mg BID, lithium  150 HS, trazodone  50 prn rare.  Keppra  500 BID. Trazodone  good sleep med. SE drooling again.   Not sure any mental health effect of Keppra .   Denies any street drugs despite positive UDS for ecstasy. Overall still having intrusive HI/SI without intent or plan chronically but maybe is a bit better than before hosp stay. Sleep is better.  Lost 40# on Wegovy.  Plan no change  06/22/23 appt noted: EEG shows some epileptiform activity with FU pending with another 4 hour EEG pending. Meds unchanged Overall mental health about the same or a tad bit better.  Seems he can distract himself from intrusive thoughts and they are less common than they were.   CC mouth watering from med.  Choking middle of the night.  Atropine  helps some but has to be repeated and pain in the ass.   Sleep 6 hours.  Sometimes taking trazodone  50.  Sleep is not that bad.  Psych med: clozapine  reduced to 300 HS, , clonazepam  0.5 mg BID, lithium  150 HS, trazodone  50 prn rare.  Keppra  500 BID. Plan no changes  07/20/23 appt noted:  alone and with parents. Psych med: clozapine  reduced to 300 HS, , clonazepam  0.5 mg BID, lithium  150 HS, trazodone  50 prn rare.  Keppra  500 BID. B12 oral.  EEG sched for 08/2023; appt 08/25/23 Maybe a little better but still same intrusive thoughts.  They cause him  to have somatic anxiety like palpitations.  You know I don't want to do any of those thoughts.    Out of the blue.   SE still drooling.  Using drops some.  But not often. Anna Slayden therapist in [place].  Likes her.  She's good at teaching . Awakening 2-3 times night nocturia usually.  Back to sleep ok Parents think he is better with more interest. Seems less disturbed .  More outgoing and social and mother agrees. Switched lorazepam  to clonazepam  0.5 mg BID seems to be working better and more calming and lasts longer.  08/18/23 appt noted: Med: clozapine  reduced to 300 HS, , clonazepam  0.5 mg BID, lithium  150 HS, trazodone  50 prn rare.  Keppra  500 BID. B12 oral.  Appt next week with Dr. Loreli to review EEG. No med changes.  Still on above. SE drooling. No effect from atropine  2 drops . Mood not worse but not a whole lot better Anxiety still better with clonazepam  vs lorazepam . Sleep pretty good 5-6 hours and would like to sleep more.  Nocturia.   Not sedated from clozapine .  Rare naps. No other medical px except about EEG.   Low motivation bc doesn't feel like it.  So disgusted with lack of response  with meds.   Does limited chores.  Has cleaning lady.  At parent's house off and on. Go through the motions.  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  Just lay around.   Plan: No med changes.  Will wait to make med changes until after EEG work up completed.    09/19/23 appt noted:  disc plan with parents.  Med: clozapine  reduced to 300 HS, , clonazepam  0.5 mg BID, lithium  150 HS, trazodone  50 prn rare.  Keppra  500 BID. B12 oral.  Neuro  Dr Maree EEG spikes not likely to cause psych sx Frustrated with chronic psych sx dep.  Gets angry over the fact that no meds seem to help. Goes out to eat and enjoys food but doesn't feel like doing much.   No sig change in psych sx.  Chronic anxiety and dep.  Anxiety is better with clonazepam  than other meds.  Will get triggered in loud places with agitation.  Low  tolerance of stimulation. OCD therapist Anna Slayden , [place], said she couldn't help his chronic intrusive HI/SI.   Still has spells of intrusive HI/SI Plan no changes  10/24/23 appt noted:  parents here today Med: clozapine  reduced to 300 HS, , clonazepam  0.5 mg BID, lithium  150 HS, trazodone  50 prn occ.  Keppra  500 BID. B12 oral.  Atropine  drops at night.   No SZ in over 6 mos.  Not driving again yet. Trazodone  works when needed.  Likes it .   Some panic but less severe and frequent.  Not as bad.   SE no much except drooling ongoing.  Uses atropine  drops at night Don't like things I used to like but not really new.   Clozapine  good for sleep.  Still some intrusive thoughts but not as bad as they were. F 53 yr at [employer].  Generally dep is worse than anxiety.  Anhedonia and intrusive thoughts.  Wt   Decided more even anxiety benefit with clonazepam  vs lorazepam .  Anxiety less on clonazepam .  Psych med hx extensive including ECT and  Risperidone  Maxed out risperidone  8 mg without benefit for intrusive thoughts  Zyprexa  30 Latuda 80 which caused akathisia,  Vraylar, Rexulti, aripiprazole 20 mg with akathisia,  Seroquel  1000 mg,  InVega, Geodon , Saphris with side effects, symbyax, Fanapt NR .  Caplyta  SE and markedly worse. Clozapine  started end of March 2024 lithium  1200 SE,  lamotrigine 300 mg, Depakote 2000 mg, Tegretol, Trileptal and several of these in combinations, gabapentin ,  N-acetylcysteine, Nuedexta,     Belsomra with no response,  Lunesta no response, trazodone  200 mg,  Xanax , clonazepam , lorazepam  less sedation. Buspirone  NR Clonidine  SE with 2 trials  Viibryd 40 mg for 3 months with diarrhea,  protriptyline with side effects,  Trintellix 20 mg,  Parnate 50 mg with no response,   Emsam 12 mg for 2 months,   imipramine,  venlafaxine,   bupropion was side effects,  Lexapro 20 mg, sertraline, paroxetine , Deplin, fluoxetine  80,  fluvoxamine   150 agitated; retrial ? Sz related.   Auvelity NR at one daily.  SE BID  methylphenidate 60 mg,  Vyvanse, Concerta, strattera, , modafinil,  Memantine  worsening SI? Donepezil , complained of HI, Exelon  3 BID rivastigmine  DT worsening SI  pramipexole,  amantadine ,  Patient prone to akathisia.  [facility]  HX ECT   Review of Systems:  Review of Systems  Constitutional:  Positive for appetite change and fatigue.  Cardiovascular:  Negative for chest pain and palpitations.  Gastrointestinal:  Positive for constipation.  Musculoskeletal:  Positive for back pain.  Neurological:  Positive for dizziness and tremors. Negative for weakness.       Fidgety  Psychiatric/Behavioral:  Positive for decreased concentration, dysphoric mood and suicidal ideas. Negative for agitation, behavioral problems, hallucinations, self-injury and sleep disturbance. The patient is nervous/anxious. The patient is not hyperactive.     Medications: I have reviewed the patient's current medications.  Current Outpatient Medications  Medication Sig Dispense Refill   acetaminophen  (TYLENOL ) 500 MG tablet Take 1,000 mg by mouth every 6 (six) hours as needed for moderate pain.     atropine  1 % ophthalmic solution Place 2 drops under the tongue 3 (three) times daily as needed. 2 mL 1   cholecalciferol  (VITAMIN D3) 25 MCG (1000 UNIT) tablet Take 1,000 Units by mouth daily.     clonazePAM  (KLONOPIN ) 0.5 MG tablet TAKE ONE TABLET BY MOUTH TWICE DAILY 60 tablet 0   cloZAPine  (CLOZARIL ) 100 MG tablet Take 3 tablets (300 mg total) by mouth at bedtime. 90 tablet 3   Cyanocobalamin  (VITAMIN B-12 IJ) Inject as directed every 30 (thirty) days.     esomeprazole (NEXIUM) 40 MG capsule Take 40 mg by mouth daily.     fenofibrate  (TRICOR ) 145 MG tablet Take 145 mg by mouth daily.     levETIRAcetam  (KEPPRA ) 500 MG tablet Take 1 tablet (500 mg total) by mouth 2 (two) times daily. 60 tablet 0   levothyroxine   (SYNTHROID , LEVOTHROID) 75 MCG tablet Take 75 mcg by mouth daily before breakfast.     lithium  carbonate 150 MG capsule TAKE ONE CAPSULE BY MOUTH AT BEDTIME 90 capsule 0   lubiprostone  (AMITIZA ) 24 MCG capsule Take 24 mcg by mouth 2 (two) times daily with a meal.     metFORMIN  (GLUCOPHAGE -XR) 500 MG 24 hr tablet Take 500 mg by mouth 2 (two) times daily with a meal.     tamsulosin  (FLOMAX ) 0.4 MG CAPS capsule Take 1 capsule (0.4 mg total) by mouth daily. 90 capsule 3   traZODone  (DESYREL ) 50 MG tablet Take 1 tablet (50 mg total) by mouth at bedtime as needed for sleep. 90 tablet 0   No current facility-administered medications for this visit.   Medication Side Effects: Other: mild sleepiness.  Occ twitches.\, tremor  Allergies:  Allergies  Allergen Reactions   Meloxicam Other (See Comments)    Dizziness   Zyprexa  [Olanzapine ] Other (See Comments)    Akathesia   Nsaids Other (See Comments)    Hallucinations   Ibuprofen Other (See Comments)    Can not take because taking lithium    Prednisone Other (See Comments)    Can't sleep    Wellbutrin [Bupropion] Other (See Comments)    Suicidal thoughts    Past Medical History:  Diagnosis Date   ADHD (attention deficit hyperactivity disorder)    Anemia    Anxiety    Bipolar disorder (HCC)    BPH (benign prostatic hyperplasia)    Chronic kidney disease, stage 3b (HCC)    Constipation    DDD (degenerative disc disease), cervical    Depression    Deviated septum    ED (erectile dysfunction)    GERD (gastroesophageal reflux disease)    Graves disease    History of hiatal hernia    Hypertension    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    Hypothyroidism    Pneumonia    PONV (postoperative nausea and vomiting)     Family  History  Problem Relation Age of Onset   Prostate cancer Neg Hx    Bladder Cancer Neg Hx    Kidney cancer Neg Hx     Social History   Socioeconomic History   Marital status: Single     Spouse name: Not on file   Number of children: Not on file   Years of education: Not on file   Highest education level: Not on file  Occupational History   Not on file  Tobacco Use   Smoking status: Former    Current packs/day: 0.00    Types: Cigarettes    Quit date: 2010    Years since quitting: 15.4    Passive exposure: Past   Smokeless tobacco: Former    Types: Associate Professor status: Never Used  Substance and Sexual Activity   Alcohol use: Not Currently   Drug use: Never   Sexual activity: Yes  Other Topics Concern   Not on file  Social History Narrative   Not on file   Social Drivers of Health   Financial Resource Strain: Low Risk  (11/03/2022)   Received from [facility]   Overall Financial Resource Strain (CARDIA)    Difficulty of Paying Living Expenses: Not very hard  Food Insecurity: No Food Insecurity (04/17/2023)   Hunger Vital Sign    Worried About Running Out of Food in the Last Year: Never true    Ran Out of Food in the Last Year: Never true  Transportation Needs: No Transportation Needs (04/17/2023)   PRAPARE - Administrator, Civil Service (Medical): No    Lack of Transportation (Non-Medical): No  Physical Activity: Not on file  Stress: Stress Concern Present (08/12/2020)   Received from [facility] - Occupational Stress Questionnaire    Feeling of Stress : Very much  Social Connections: Unknown (09/01/2021)   Received from [facility]   Social Network    Social Network: Not on file  Intimate Partner Violence: Not At Risk (04/17/2023)   Humiliation, Afraid, Rape, and Kick questionnaire    Fear of Current or Ex-Partner: No    Emotionally Abused: No    Physically Abused: No    Sexually Abused: No    Past Medical History, Surgical history, Social history, and Family history were reviewed and updated as appropriate.   Please see review of systems for further details  on the patient's review from today.   Objective:   Physical Exam:  There were no vitals taken for this visit.  Physical Exam Constitutional:      General: He is not in acute distress.    Appearance: He is well-developed.   Musculoskeletal:        General: No deformity.   Neurological:     Mental Status: He is alert and oriented to person, place, and time.     Cranial Nerves: No dysarthria.     Motor: Tremor present.     Coordination: Coordination normal.     Comments: mild tremor  Psychiatric:        Attention and Perception: Attention and perception normal. He does not perceive auditory or visual hallucinations.        Mood and Affect: Mood is anxious and depressed. Affect is not labile, blunt, angry or tearful.        Speech: Speech normal. Speech is not slurred.        Behavior: Behavior normal. Behavior  is not slowed. Behavior is cooperative.        Thought Content: Thought content is not delusional. Thought content does not include homicidal or suicidal ideation. Thought content does not include suicidal plan.        Cognition and Memory: Cognition normal. He exhibits impaired recent memory.        Judgment: Judgment normal.     Comments: Insight fair Chronically talkative .directable. But chronically mildly pressured but less so and calmer than usual. He is chronically anxious  .  No manic signs noted. anhedonia Intrusive obsessions episodic HI/SI are better since Dec Alert and oriented.      Lab Review:     Component Value Date/Time   NA 144 04/17/2023 0314   NA 135 10/20/2022 1123   NA 142 03/20/2014 0514   K 4.4 04/17/2023 0314   K 4.1 03/20/2014 0514   CL 109 04/17/2023 0314   CL 112 (H) 03/20/2014 0514   CO2 28 04/17/2023 0314   CO2 26 03/20/2014 0514   GLUCOSE 92 04/17/2023 0314   GLUCOSE 99 03/20/2014 0514   BUN 16 04/17/2023 0314   BUN 22 10/20/2022 1123   BUN 7 03/20/2014 0514   CREATININE 1.43 (H) 04/17/2023 0314   CREATININE 0.78 03/20/2014  0514   CALCIUM  8.7 (L) 04/17/2023 0314   CALCIUM  9.5 03/20/2014 0514   PROT 6.5 04/14/2023 1047   PROT 7.3 03/18/2014 1459   ALBUMIN 3.8 04/14/2023 1047   ALBUMIN 3.2 (L) 03/18/2014 1459   AST 21 04/14/2023 1047   AST 16 03/18/2014 1459   ALT 10 04/14/2023 1047   ALT 16 03/18/2014 1459   ALKPHOS 39 04/14/2023 1047   ALKPHOS 105 03/18/2014 1459   BILITOT 0.3 04/14/2023 1047   BILITOT 0.3 03/18/2014 1459   GFRNONAA 56 (L) 04/17/2023 0314   GFRNONAA >60 03/20/2014 0514   GFRAA 54 (L) 04/18/2019 1612   GFRAA >60 03/20/2014 0514       Component Value Date/Time   WBC 4.4 10/19/2023 1028   WBC 6.5 04/21/2023 0946   RBC 4.22 10/19/2023 1028   RBC 3.92 (L) 04/21/2023 0946   HGB 12.8 (L) 10/19/2023 1028   HCT 39.8 10/19/2023 1028   PLT 395 10/19/2023 1028   MCV 94 10/19/2023 1028   MCV 89 03/20/2014 0514   MCH 30.3 10/19/2023 1028   MCH 30.9 04/21/2023 0946   MCHC 32.2 10/19/2023 1028   MCHC 32.7 04/21/2023 0946   RDW 12.4 10/19/2023 1028   RDW 12.5 03/20/2014 0514   LYMPHSABS 2.1 10/19/2023 1028   LYMPHSABS 3.2 03/20/2014 0514   MONOABS 0.4 04/21/2023 0946   MONOABS 1.0 03/20/2014 0514   EOSABS 0.4 10/19/2023 1028   EOSABS 0.4 03/20/2014 0514   BASOSABS 0.1 10/19/2023 1028   BASOSABS 0.1 03/20/2014 0514    Lithium  Lvl  Date Value Ref Range Status  10/13/2023 0.2 (L) 0.5 - 1.2 mmol/L Final    Comment:    A concentration of 0.5-0.8 mmol/L is advised for long-term use; concentrations of up to 1.2 mmol/L may be necessary during acute treatment.                                  Detection Limit = 0.1                           <0.1 indicates None Detected  08/12/21 lithium  level 0.9 on 450 mg.  lithium  level Sept 0.8.   lithium  level July 27, 2018 was normal at 1.0.   Lithium  level [facility] October 03, 2018 = 1.2. Said he got lithium  level at [facility] as requested.  Labs not in Epic.  Recent lipids ok except higher TG than usual.  Normal A1C.  11/10/22 Checked clozapine   level on 400 mg HS= cloz 733+norclozapine 294 = total 1027  06/07/23  Vitamin B12 >300 pg/mL 848  On oral supplement  .res Assessment: Plan:    Kerem was seen today for follow-up, depression, anxiety and fatigue.  Diagnoses and all orders for this visit:  Severe bipolar I disorder, current or most recent episode depressed (HCC)  GAD (generalized anxiety disorder)  Panic disorder with agoraphobia  Insomnia due to mental condition -     traZODone  (DESYREL ) 50 MG tablet; Take 1 tablet (50 mg total) by mouth at bedtime as needed for sleep.  Other obsessive-compulsive disorders  Mild cognitive impairment  Sialorrhea  Low serum vitamin B12  Seizure (HCC)  Long term current use of clozapine   Drug-induced constipation     60 min appt with pt  needs a lot of time   .   Seen alone and then with parents for additional info.  Pt is fair historian.  Chronic TR bipolar mixed and chronic anxiety.  He usually has mixed bipolar symptoms which we have not been able to completely eliminate.  See long list of meds tried.   He is  chronically unstable .  Mood is relatively stable except for the severe intrusive, ego-dystonic persistent obsessive HI/SI without intent or plan.  No better no worse.  Residual anhedonia. Hx chronic intrusive ego dystonic thoughts HI/SI.  More like obsessions than true HI/SI.   No recent mania.  Chronic dep and anxiety.  But is a little better since being on Keppra .   Psych hosp 08/02/22 with SI and acute kidney injury and elevated lithium . Processed this and disc goal of trying to stop lithium  but risk of worsening SI.  He has chronic SI and there's risk of this worsening off lithium .  Lithium  and clozapine  are the most effective meds for SI.    hosp for SZ episodes.  Fluvox stopped and clozapine  reduced to 300 mg HS.   Was RX risperidone  but he stopped that after DC.  It was ineffective in at least 2 trials.  We discussed the short-term risks associated with  benzodiazepines including sedation and increased fall risk among others.  Discussed long-term side effect risk including dependence, potential withdrawal symptoms, and the potential eventual dose-related risk of dementia.  But recent studies from 2020 dispute this association between benzodiazepines and dementia risk. Newer studies in 2020 do not support an association with dementia.  Unfortunately due to the severity of set his symptoms polypharmacy is a necessity.    Lithium  being used bc chronic SI and death thoughts.  Counseled patient regarding potential benefits, risks, and side effects of lithium  to include potential risk of lithium  affecting thyroid  and renal function.  Discussed need for periodic lab monitoring to determine drug level and to assess for potential adverse effects.  Counseled patient regarding signs and symptoms of lithium  toxicity and advised that they notify office immediately or seek urgent medical attention if experiencing these signs and symptoms.  Patient advised to contact office with any questions or concerns.  Reduced lithium  to 150 mg daily Dt hx toxicity Checked lithium  level and BMP and level  1.1 on 300 mg Daily so reduced again to 150 mg daily and level 0.4 2024-04-27 Call if death thoughts worsen or worsening SI.  Disc clozapine  and it's level..  Disc data showing less SI with it but he's had no changes.    Disc risk in detail including low WBC with complication, myocarditis.  Extensive discussion of CBC monitoring.  Disc going higher despite level and checking ECG bc cardiac risk at higher doses.   Disc this again bc taking Wegovy might help him to tolerate it better.   clozapine  600 mg HS was Started 12/07/22. Started clozapine  08/05/22. 11/10/22 Checked clozapine  level on 400 mg HS= cloz 733+norclozapine 294 = total 1027 Reduced to 300 mg HS at the hospital. CBC every 4 weeks, now that on it over a year.   Use the atropine  drops prn 2-4 on each side.  It's  managed.  Did he take duloxetine?  Might help depression more.  Consider retry pramipexole off label  Consider Qelbree.  Lamotrigine not a good option.    Consider trial Fanapt at followup  Switched lorazepam  to clonazepam  0.5 mg BID  working better and more calming and lasts longer. M administers  For MCI had to stop rivastigmine  DT worsening SI. Neuro [facility] Dr. IZORA Fairly, [facility] in progress.   EEG on 06/03/23 bc recent sz 08/25/23 appt Dr. 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 No breakthrough seizures since last visit. Currently on levetiracetam  500 mg twice daily. Requests a once-daily regimen. Epileptiform discharges necessitate continued anti-seizure medication to mitigate recurrent seizure risk. Discharges are insufficient to cause psychiatric issues. Levetiracetam  XR offers once-daily dosing convenience without altering the total daily dose. - Switch to levetiracetam  1000 mg XR once daily, to be taken at nighttime.    We discussed the short-term risks associated with benzodiazepines including sedation and increased fall risk among others.  Discussed long-term side effect risk including dependence, potential withdrawal symptoms, and the potential eventual dose-related risk of dementia.  But recent studies from 2020 dispute this association between benzodiazepines and dementia risk. Newer studies in 2020 do not support an association with dementia.  Discussed safety plan at length with patient.  Advised patient to contact office with any worsening signs and symptoms.  Instructed patient to go to the [facility] emergency room for evaluation if experiencing any acute safety concerns, to include suicidal intent.  He commits to safety. His HI/SI that he complains about are intrusive ego-dystonic obsessions and not true HI SI in that there's no desire or intent ever.  Disc Ozempic and Monjauro for weight loss.  Has had weight  gain from meds as a contributor. Lost 45# with Wegovy and 150#.    Needs to keep up activity as much as possible for depression.  For severe constipation seeing GI  Had constipation before clozapine  and GI appt in June started Amitza  which helped some. Needs to push fluids.    Historically he has not done well with serotonergic medicines which would typically be used for obsessive thoughts.  But we are running out of options.   Repeat ECT not good option bc SZ for TR sx.   Disc memory concerns and mother's memory concerns bc had it before.  Disc counseling.  [email or web address] for therapist.  Most effective therapist will need to be expert in CBT.  He has one now.  Consider clozapine  at FU  FU 4 weeks   Lorene Macintosh, MD, DFAPA  Future  Appointments  Date Time Provider Department Center  11/07/2023  1:30 PM Cottle, Lorene KANDICE Raddle., MD [facility] None  12/14/2023  1:30 PM Cottle, Lorene KANDICE Raddle., MD [facility] None  12/29/2023  1:30 PM Francisca Redell BROCKS, MD [facility] None  01/18/2024  1:30 PM Cottle, Lorene KANDICE Raddle., MD [facility] None    No orders of the defined types were placed in this encounter.      -------------------------------

## 2023-10-24 NOTE — Patient Instructions (Addendum)
 Option  trial of pramipexole for depression and anhedonia in August. No med changes today Sent prescription for trazodone .

## 2023-10-26 ENCOUNTER — Telehealth: Payer: Self-pay | Admitting: Psychiatry

## 2023-10-26 NOTE — Telephone Encounter (Signed)
 Mom reporting patient confused and he took his PM medications this morning. Mom said she told him not to take his AM meds and to go to bed.  His PM meds are lithium , trazodone , and clozapine . Mom wants to know what to do going forward.

## 2023-10-26 NOTE — Telephone Encounter (Signed)
 Don't take any more meds today and resume normal dosing tomorrow.

## 2023-10-26 NOTE — Telephone Encounter (Signed)
Mom notified of recommendation.

## 2023-10-26 NOTE — Telephone Encounter (Signed)
 Patsy-mom called 12:09 reporting Pt took pm meds this am instead of am. RTC [phone]

## 2023-11-07 ENCOUNTER — Ambulatory Visit: Admitting: Psychiatry

## 2023-11-15 ENCOUNTER — Ambulatory Visit: Admitting: Psychiatry

## 2023-11-16 ENCOUNTER — Other Ambulatory Visit: Payer: Self-pay | Admitting: Psychiatry

## 2023-11-16 ENCOUNTER — Encounter: Payer: Self-pay | Admitting: Urology

## 2023-11-17 LAB — CBC WITH DIFFERENTIAL/PLATELET
Basophils Absolute: 0.1 x10E3/uL (ref 0.0–0.2)
Basos: 2 %
EOS (ABSOLUTE): 0.4 x10E3/uL (ref 0.0–0.4)
Eos: 9 %
Hematocrit: 39.7 % (ref 37.5–51.0)
Hemoglobin: 12.9 g/dL — ABNORMAL LOW (ref 13.0–17.7)
Immature Grans (Abs): 0 x10E3/uL (ref 0.0–0.1)
Immature Granulocytes: 0 %
Lymphocytes Absolute: 2 x10E3/uL (ref 0.7–3.1)
Lymphs: 48 %
MCH: 30.8 pg (ref 26.6–33.0)
MCHC: 32.5 g/dL (ref 31.5–35.7)
MCV: 95 fL (ref 79–97)
Monocytes Absolute: 0.3 x10E3/uL (ref 0.1–0.9)
Monocytes: 6 %
Neutrophils Absolute: 1.4 x10E3/uL (ref 1.4–7.0)
Neutrophils: 35 %
Platelets: 364 x10E3/uL (ref 150–450)
RBC: 4.19 x10E6/uL (ref 4.14–5.80)
RDW: 12.5 % (ref 11.6–15.4)
WBC: 4.1 x10E3/uL (ref 3.4–10.8)

## 2023-11-18 ENCOUNTER — Other Ambulatory Visit: Payer: Self-pay | Admitting: Psychiatry

## 2023-11-18 DIAGNOSIS — F314 Bipolar disorder, current episode depressed, severe, without psychotic features: Secondary | ICD-10-CM

## 2023-11-18 NOTE — Telephone Encounter (Signed)
 Patient called in for refill for Clonzapine 100mg .Albert [name] states that he will be out this weekend and needs prescription sent ASAP [facility]: [phone] Appt 8/13 Pharmacy [facility] [address] [place], [place]

## 2023-11-18 NOTE — Telephone Encounter (Signed)
 Addressed thru pharmacy interface.

## 2023-11-24 ENCOUNTER — Other Ambulatory Visit: Payer: Self-pay | Admitting: Psychiatry

## 2023-11-24 DIAGNOSIS — F314 Bipolar disorder, current episode depressed, severe, without psychotic features: Secondary | ICD-10-CM

## 2023-11-24 DIAGNOSIS — Z79899 Other long term (current) drug therapy: Secondary | ICD-10-CM

## 2023-12-14 ENCOUNTER — Other Ambulatory Visit: Payer: Self-pay | Admitting: Psychiatry

## 2023-12-14 ENCOUNTER — Encounter: Payer: Self-pay | Admitting: Psychiatry

## 2023-12-14 ENCOUNTER — Ambulatory Visit (INDEPENDENT_AMBULATORY_CARE_PROVIDER_SITE_OTHER): Admitting: Psychiatry

## 2023-12-14 DIAGNOSIS — F314 Bipolar disorder, current episode depressed, severe, without psychotic features: Secondary | ICD-10-CM | POA: Diagnosis not present

## 2023-12-14 DIAGNOSIS — F5105 Insomnia due to other mental disorder: Secondary | ICD-10-CM

## 2023-12-14 DIAGNOSIS — K117 Disturbances of salivary secretion: Secondary | ICD-10-CM

## 2023-12-14 DIAGNOSIS — G3184 Mild cognitive impairment, so stated: Secondary | ICD-10-CM

## 2023-12-14 DIAGNOSIS — F428 Other obsessive-compulsive disorder: Secondary | ICD-10-CM

## 2023-12-14 DIAGNOSIS — Z79899 Other long term (current) drug therapy: Secondary | ICD-10-CM

## 2023-12-14 DIAGNOSIS — F411 Generalized anxiety disorder: Secondary | ICD-10-CM | POA: Diagnosis not present

## 2023-12-14 DIAGNOSIS — F4001 Agoraphobia with panic disorder: Secondary | ICD-10-CM

## 2023-12-14 DIAGNOSIS — K5903 Drug induced constipation: Secondary | ICD-10-CM

## 2023-12-14 DIAGNOSIS — R569 Unspecified convulsions: Secondary | ICD-10-CM

## 2023-12-14 DIAGNOSIS — E538 Deficiency of other specified B group vitamins: Secondary | ICD-10-CM

## 2023-12-14 MED ORDER — LITHIUM CARBONATE 150 MG PO CAPS
150.0000 mg | ORAL_CAPSULE | Freq: Every day | ORAL | 1 refills | Status: AC
Start: 2023-12-14 — End: ?

## 2023-12-14 MED ORDER — CLONAZEPAM 0.5 MG PO TABS: 0.5000 mg | ORAL_TABLET | Freq: Two times a day (BID) | ORAL | 3 refills | Status: DC

## 2023-12-14 MED ORDER — TRAZODONE HCL 50 MG PO TABS: 50.0000 mg | ORAL_TABLET | Freq: Every evening | ORAL | 0 refills | Status: AC | PRN

## 2023-12-14 MED ORDER — CLOZAPINE 100 MG PO TABS: 300.0000 mg | ORAL_TABLET | Freq: Every day | ORAL | 3 refills | Status: DC

## 2023-12-14 NOTE — Patient Instructions (Signed)
 Vraylar every day or MWF to help enjoyment and interest

## 2023-12-14 NOTE — Progress Notes (Signed)
 Albert [name] [id number] 1965-07-12 58 y.o.    Subjective:   Patient ID:  Albert [name] is a 58 y.o. (DOB [birth year]) male.  Chief Complaint:  Chief Complaint  Patient presents with  . Follow-up  . Depression  . Anxiety  . Sleeping Problem      Albert [name] presents to the office today for follow-up of mixed bipolar, anxiety and still grieving stress of breakup.  He requires frequent follow-up because of long-term symptoms.  At visit October 25, 2018.  We made several medicine changes because of his concerns about sleep and other issues.  We increased olanzapine  back to 7.5 mg bc not sleeping as well at 5 mg.  He has to have the prescription for quetiapine  written for up to 3 tablets at night in case insomnia is worse because insomnia makes his mood disorder so much worse.  We discussed that was above the usual max but the request was granted given his treatment resistant status. We also reduce lithium  from 900 mg daily to 750 mg daily to try to reduce tremor and muscle twitches.  At  visit March 28, 2019.  Fluoxetine  was increased to 40 mg daily.   Early January 2021 increased to 60 mg daily.  No SE.  seen June 28, 2019.  No further meds were changed except olanzapine  was increased back to 10 mg daily to see if if he could get additional benefit per his request.  Covid vaccinated.  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  Then had cholecystectomy.  April 2021 appt with the following noted: 2 episodes night sweats lately.  Memory has been very bad lately.  Repeatedly asks mother questions. Still  tend to stay in his room and his bed.  Still has rapid cycling mood swings.  Maybe some better with increase in olanzapine  to 10 and tolerating itl.  Would like to get out more but can't DT Covid.  Can perform necessary chores.  Will get out of the house when he can.  No change in death thoughts and anxiety in intensity but is better with frequency.  Usually comes and goes in waves but more persistent.   Consistent with meds.  Ativan  not helping anxiety very dramatically but he's not sure.  Fidgety.  No trigger other than still grieving relationship and can't get it out of his head.  Poor energy, concentration and more forgetful. In bed more and less active.  Sleep ok lately which is unusual.  Can concentrate on financial matters and stock market at times.  Tolerating meds.  Still some intrusive SI without reason. Less frequent obsessive thoughts about broken relationship and has been doing this for months.  Can't let it go.  Loop.   No unusual stress even with the family who is supportive. Likes the benefit that Zyprexa  gives. No sleepwalking nor falling nor odd behavior.  No history of sleep walking.  He understands this may recur with an increase.  Also wants option to rarely take extra quetiapine  300 for sleep prn. Plan:  Try to reduce lorazepam  if possible.  10/22/2019 appointment with the following noted: Continues fluoxetine  60, lithium  600 mg, lorazepam  2 mg AM and HS and 1 mg midday, olanzapine  10, quetiapine  600 mg HS. Still anxious chronically and including driving in crowded spaces.  Doesn't thing Ativan  helps as well as Xanax  but less cognitive problems. Sleep is not as good.  Occ EFA.  More EMA and wanting to do things in the middle of  the night but this is not typical.  Wonders about why that happens.  Some napping.   Tolerating meds.  Asks about weight loss meds.  Disc this in detail.   Plan no changes except OK meclizine  prn vertigo.  02/11/20 appt with the following noted: Able to gradually reduce lorazepam  to 1 mg AM and HS. More depressed over time and less interested in things and less interest in going out but does with his parents. Has reduced from 800 to 600 mg HS with some awakening but usually able to go back to sleep.[name]  Spending a good amount of time in bed bc watches TV in bedroom.  Parents watch TV in different part of the house.  No mood swings he notices.   Concerns  about weight gain about 200#. Likes olanzapine 's benefit for sleep. Plan: Trial for TRD to  Increase fluoxetine  to 80 mg daily  to use the combo with olanzapine  for TR bipolar depression.   04/21/20 appt with following noted: I thought in beginning some benefit with fluoxetine .  Lifelong negative thinking continues.   Not much bipolar.  Reads a lot on bipolar.   Still tired and anxious a little more may be seasonal.  No familial stressors.  Tends to lay down in afternoon.  Anxiety not over anything in particular.   No SE with fluoxetine . Took meclizine  prn.  Easily motion sick.    Asks questions about newer drugs for bipolar like Caplyta . Plan:  No med changes  06/30/20 appt noted: Tired of wearing masks.  Vaccinated.  Asked questions about when this will end with mask mandate.  Mood up and down some since here and getting up 2-3 times.  Disc awakening problems.  Trying to do better bc night eating some.   Mood is about average today so far.  [name] out with friends for breakfast.  Recognizes activity helps mood.   3 days in a row napped a lot in the afternoon.   Bed is a comfort place.  Gets bored and hard to motivate.  Tries to stay away from night eating and spending.   More depressed when alone and wonders about med changes. Plan: Failed response to olanzapine  + fluoxetine  for TR bipolar depression.   Reduce fluoxetine  to 1 daily and reduce olanzapine  to one half nightly for 10 days and then stop it. Then start Caplyta  1 daily  08/27/2020 appointment with the following noted: Patient decompensated with the above switch to Caplyta  with intrusive suicidal thoughts and had to be hospitalized for psychiatric reasons.  Multiple phone calls with family members since that time.  Spoke with the psychiatric nurse practitioner with the decision to restart olanzapine  in place of the Caplyta  given the patient was more stable while on the combination of Seroquel  and olanzapine  than with the  Caplyta . Caplyta  triggered HI/SI and depressed and confused on it, and agitated and still has some of it now. No akathisia. Frustrated with lack of therapy at the hospital.   Hydroxyzine  didn't help jitteriness.  Feels some better.  Fleeting SI & HI but not obsessive like it was prior to hospitalization.  Feels a little amped up and nervous.  Scared of what could happen.  Apprehensive being here talking about things. 5-6 hours sleep last night and would like to have more. At mom and dad's house right now and up more in the day. Inadvertently stopped lorazepam  abruptly by mistake likely contributing to shakiness. Still some memory issues.  Trying to stay out of  bed when watching TV.  Some awakening but managing. Occ fleeting SI and distracts himself.   Always spends a lot of time just laying around.   Can have anxiety for no reason like coming here, and varies in intensity without pattern.      Less panic than in the past.  Some chronic depression and hyperactivity and loudness and hyperverbal.  Usually back to sleep.  Total 6 hours but some napping, awakens 2-4 times nightly but back to sleep..  Taking quetiapine  600 mg HS.  still lacks interest and motivation.  Can follow a TV show if interested. Plan: Restart lorazepam  1 mg in the morning, 1 mg in the afternoon and 1 mg at night for anxiety Stop hydroxyzine   Increase olanzapine  to 1 and 1/2 of 10 mg tablets in evening  09/05/2020 appt noted: seen with parents today at his request Made med changes noted and tolerated the changes Better than I did last week. Now only fleeting HI/SI and no where near what it was.  Sleeping better.  Still not a lot of energy.  Anhedonia.  Less agitation.  A lot of anxiety all the time but it's helping.  Would like more energy and motivation.   Doesn't handle stress well. Parents note he's less shakey.  He's still staying with his parents.  Only driven once since this happened and F wants him to be able to drive and  function more independently.  M agrees he's better.  Memory is better but not normal.  Primarily STM problems No akathisia with olanzapine  this time.    Administering his own meds but mo watched. Slept 8 hours last night. Plan consider further reductions in quetiapine   10/17/20 appt noted: Stayed on Seroquel  600 HS and olanzapine  15. Olanzapine  seemed to do most for the sleep.  Also helping eliminated HI/SI for the most part.  Wants to try to increase it.  Depression is much better with less rumination also.  Fears akathisia at higher dose as in the past. Also on fluoxetine  40.   Plan: Reduce Seroquel  to 1 and 1/2 tablets at night Increase olanzapine  to 1 of the 15 mg tablet and 1/2 of the 5 mg tablet for 1 week,  Then, if tolerated increase the olanzapine  to 20 mg or 1 of the 15 mg tablets and 1 of the 5 mg tablets. If possible then also reduce quetiapine  to 1 tablet at night.  11/05/2020 appointment with the following noted: Up to olanzapine  20 mg hs for 3 days.  Reduced Serooquel to 450 mg HS.  Didn't try to go lower. Had hiatal hernia and umbilical hernia surgery recently with no problem.   No akathisia so far. Sleep ok with meds so far changes.  No mood changes yet but overall likes the smoothness of olanzapine  and helping sleep without knociing him out. Still some occ HI/SI, he thinks bc of anxiety generally.  Easily anxious. Plan: Continue olanzapine  20 mg nightly longer to give it time to help mood sx reduce quetiapine  to 1 tablet at night to minimize polypharmacy and reduce risk of akathisia.  01/07/21 appt noted: Able to reduce quetiapine  300 mg HS ok with decent sleep but still wakes a lot.  No AM hangover.  Don't have a lot to do.  Can enjoy going out and doing things.  But doesn't do it longterm.  Family is doing good.  Father started year 61 at his job.   Tolerating the meds well.  May wake more with quetiapine   reduction. Lost 10#. Wonders if could try other meds he didn't  tolerate before bc now can tolerate olanzapine  and couldn't tolerate it in the past DT akathisia. Mood never great but can swing into depresssion without reason.   Plan: Continue olanzapine  20 mg nightly longer to give it time to help mood sx reduce quetiapine  to 1/2 of the 300 mg  tablet at night to minimize polypharmacy and reduce risk of akathisia.  02/02/2021 appointment with the following noted: Tried reduced quetiapine  to 150 mg and after a week had withdrawal sx including jittery and SI and he increased to 250 mg daily.  Felt better after increasing to 300 mg daily and last week reduced to 250 mg daily.  After increase quetiapine  felt better in a few days.  Seems more alert with less quetiapine  but can't tolerate a quick withdrawal.   Some awakening.  Plan: Reduce quetiapine  by 50 mg every 2-4 weeks.  03/09/2021 phone call: Pt called and said that he is weaning off the seroquel . He was down to 200 mg of the seoquel and he started having withdrawal symptoms and sucidal thoughts. He went back up to 250 mg because he knew he was good on that.    Pt stated he starting back taking 250 mg on Saturday due to the symptoms he was having.Now he is no longer having suicidal thoughts but still very jittery. MD: He may just need to taper slower than most people.  If he can tolerate the 250 mg Seroquel  with the jitteriness it should get better over the next 1 to 2 weeks.  Then he can try going down again by 50 mg at a time.  He probably needs to go down by just 50 mg every 2 to 4 weeks and wait till he fully adjusts to each reduction before he reduces again.  03/17/21 appt noted: Hernia surgery 2 weeks ago. Seroquel  250 mg for a month then 200 mg daily and had SI and increased again to 300 mg daily.  Would love to get off it.  Withdrawal triggers intrusive HI/SI but otherwise doesn't have them No SE. Sleep is OK but not enough REM sleep.  Satisfied with other meds.   No current HI/SI.  No paranoia.  Still  has anxiety and taking Ativan  every 6 hours.  It works but doesn't cure it. Chronic anxiety and ask about ketamine for chronic depression. Plan: Because he had WD reducing from 250 to 200 mg daily will have to go slowly. Reduce quetiapine  by 25 mg every 2-4 weeks. Reduce Seroquel  to 250 mg for 2 weeks, Then reduce to 225 mg for 2 weeks, Then reduce to 200 mg for 2 weeks, Then reduce to 175 mg for 2 weeks, Then reduce to 150 mg Also: Buspirone  30 mg tablets for anxiety Start 1/3 tablet twice daily for 1 week Then increase to two thirds twice daily for 1 week Then increase to 1 tablet twice daily  04/15/2021 appointment with the following noted: Reduced Seroquel  to 200 mg HS and increased buspirone  to 30 mg BID STM px and wants to try to reduce Ativan  to see if it is better.  But fears with drawal sx. Parents complain about his memory. Less HI and SI since here. Less depressed. Plan: Buspirone  30 mg tablets for anxiety Start 1/3 tablet twice daily for 1 week Then increase to two thirds twice daily for 1 week Then increase to 1 tablet twice daily Continue olanzapine  20 mg nightly longer to give  it time to help mood sx Because he had WD reducing from 250 to 200 mg daily will have to go slowly. Reduce quetiapine  by 25 mg every 2-4 weeks. Reduce Seroquel  to 250 mg for 2 weeks, Then reduce to 225 mg for 2 weeks, Then reduce to 200 mg for 2 weeks, Then reduce to 175 mg for 2 weeks, Then reduce to 150 mg Trial taper lorazepam : In hopes of improving short-term memory  04/30/2022 phone call: Complaining of withdrawal symptoms reducing lorazepam  from 0.5 mg 5 times a day to 0.5 mg 4 times daily.  05/21/2021 appointment with the following noted: Gotten onto buspar  30 BID and down on lorazepam  to 0.5 mg QID. Gotten down to Seroquel  200 mg HS A little restless every now and then but able to reduce lorazepam  for the last weeks. Is too inactive and lays in bed too much watching TV.   Can' t  tell effect of buspirone  bc of other changes.  Sleep with awakening with 6-7 hours. Fleeting SI without trigger or plan or intent. Depressed more than anxious. Enjoys watching stocks and investing. Plan: Trial taper lorazepam  gradually over time In hopes of improving short-term memory But for now continue lorazepam  0.5 mg QID Start clonidine  0.1 mg tablets one half at night for 3 nights, then 1/2 tablet twice daily for 3 nights, then one half in the morning and 1 tablet at night If clonidine  does not noticeably help anxiety call the office  05/27/2021 phone call initially complaining of homicidal and suicidal thoughts after taking 1 mg of clonidine .  He was told to stop the medication.  But then called back stating he was having homicidal and suicidal thoughts from withdrawal from the medication which was not logical.  He agreed it was not logical and stated on 05/29/2021 that he felt better  06/23/2021 appointment the following noted: No clonidine . Sunday panic attack eating out and then having SI.  Stayed with father to feel safer. Intrusive thoughts of hurting parents or others.  HI only when has SI.  Usually panic without SI.  Then had intrusive thoughts about hurting others.   Usually intrusive thoughts are brief and fleeting.    07/20/21  appt noted; More anxious with less lithium .  More anxious in his body.  Not more depressed or irritable.  Sleep is the same.  More racing thoughts.  Chronic SI probably a litte worse but not unmanageable. Rduced lithium  last week to 300 mg daily. No recent change in lisinopril . Off the clonidine  for a week or so. Doesn't drink much water.  Drinks a lot of diet coke. No marked akathisia or RLS but does shake his leg DT anxiety. Worries about needing mor emedicine. Plan: Increase olanzapine  to 25 mg nightly bc more racing thoughts and anxiety with less lithium  DT severity of sx and difficulty getting off Seroquel  conside rincrease.  07/30/21 TC : CO  racing SI today and wonders about whether increased sx racing thoughts, hyperactivity, SI related to Reduced lithium  from 600 mg daily to 300 mg daily recently DT lithium  level 1.8.  could be related.    Plan: increase olanzapine  30 mg pm Today take lithium  600 mg now.  Tomorrow start lithium  CR 450 mg daily Continue olanzapine  30 mg PM until SI resolves unless akathisia returns.  Lorene Macintosh, MD, DFAPA  08/17/21 appt noted:  No akathisia with olanzapine  30 mg pm (about 3 weeks) and is sleeping better. Increased lithium  to 450 mg daily. Tolerating meds. Still racing  negative thoughts maybe some better.  Asked about it. SI come and go.   Anxiety about the same as depression.  No motivation unless has to do something. Plan: Reduce  fluoxetine  20 mg 1 daily in hopes racing thoughts and anxiety are better.  09/14/2021 appointment with the following noted: No difference noted with reduction fluoxetine  20 mg daily. Most of the time negative thoughts, ex what if I get in a wreck?  Frequent negative thoughts.   Still on lorazepam  0.5 mg TID.   Wonders about trying prior meds that caused akathisia bc no longer gets it with olanzapine  30 mg daily. Sleep is usually ok.  Taking Seroquel  200 mg HS.  Thinks the olanzapine  helps his sleep and doesn't want to stop it. Panic 1-2 times per month.  Then takes extra lorazepam . Plan: DC fluoxetine  20 mg 1 daily  Trial Auvelity off label for depression 1 in the AM for 1 week then 1 in AM and 1 with evening meal  10/15/2021 appointment with the following noted: Occ of getting confused as to day and went to church on a Thursday my mistake.   Had it happen a few months ago too.  Memory is terrible. Thinks he couldn't tolerate more Auvelity. Not a lot of benefit from [place] 1 daily. Still quite a bit of anxiety.   A little drowsy  and sleeps 7-8 hours at night. Word finding problems.  Lost wallet. Plan: DC  Auvelity  For [facility]: memantine  1/2 tablet in the  AM for 1 week, then 1/2 tablet twice daily, then 1/2 tablet in the AM and 1 tablet at night for 1 week then 1 tablet twice daily  10/22/2021 phone call complaining of additional stress asking to start new medication because of anxiety and more suicidal thoughts without intent or plan.  Reports taking more than 4 prescribed Ativan  a day recently.  Asking to increase olanzapine  which is already at 30 mg daily. MD response:We cannot go any higher in the dosage of olanzapine  that he is currently taking.  A lot of these suicidal thoughts are apparently anxiety driven.  Let us  have him resume taking paroxetine  which is one of the more effective medicines for anxiety.  I will send it in and the instructions will be paroxetine  20 mg tablets 1/2 tablet daily for 1 week then 1 tablet daily  11/16/21 appt noted: Multiple phone calls since she was here.  Complaining of suicidal thoughts from memantine  which was stopped. Started paroxetine  and up to 20 mg daily.  Continues olanzapine  30 mg, lithium  450 mg nightly, lorazepam  0.5 mg 4 times daily , And quetiapine  200 mg nightly. Questions about whether memantine  caused the neg SI really bc has them at times. When has SI often anxiety driven.  Will tend to ruminate on past relationship ended.   Edgy and irritable all the time but memantine  seemed to make it worse. Mind is getting better with less SI lately.   No SE with paroxetine .   Aggrivated by forgetfulness.  eXample, talked with someone about going to breakfast last night and then forgot to go this mornin.g. Plan prescribed donepezil  5 mg daily for cognitive complaints specifically forgetfulness  12/31/2021 appointment with the following noted: He had been prescribed donepezil  5 mg for cognitive complaints.  He called back reporting he felt it was causing homicidal thoughts and was instructed to stop it.  He has had these types of thoughts in the past.  He had no desire to act on  them. Discouraged. Ongoing  depression and reduced enjoyment and negative thoughts.  Can enjoy some things. A lot of anxiety chronically.  Trying to limit Ativan  to 5 of 0.5 mg daily.  Sometimes needs 2. Constipation for years.  02/03/22 appt noted: Depression makes LBP worse. Ongoing chronic depression. Tolerated rivastigmine  without triggering SI Chronic anxiety and Ativan  helps some. Occ panic but not usual.  Asks if any other meds coud be used. Plan: But for now continue lorazepam  0.5 mg QID, but use LED for cog reasons Increase rivastigmine  to 3 mg capsule 1 twice daily or 2 of the 1.5 mg capsules twice daily.  Call if you have problems with nausea Start dextromethorphan  1 capsule twice daily . Thi sis off label for depression based on MOA of Auvelity and use of paroxetine  to prolong half life of DM. Presence of paroxetine  may have been reason he didn't tolerate Auvelity brief trial before.  Will proceed slowly.  03/17/2022 appointment noted: Current psych meds: Rivastigmine  3 mg twice daily, quetiapine  200 mg nightly, paroxetine  40 mg daily, olanzapine  30 mg nightly, Namenda  10 mg daily, lorazepam  0.5 mg 4 times daily, lithium  CR 450 mg nightly, dextromethorphan  15 mg twice daily A lot of anxiety since here.  Some days taken more lorazepam  to 6-7 tablets daily.  No major triggers but anxious even about coming here and other normal things.  Cancelled trip to collect rocks with friend and cancelled the trip bc anxious.  Usually if can go he is ok.  Lots of SI ongoing Memory might be a little better. Asks how he responded to Vraylar.  Will check paper chart.  03/24/22 TC:  CO SI worse and restilless with Rivastigmine  9 mg . MD response:  Stop the rivastigmine  since the [place] appears to be having side effects from it.  He was having anxiety at the last appointment 2 and then we increased the dose.  This might be causing the anxiety to be worse. He has chronic suicidal thoughts without intent or plan.  However it appears  the thoughts are more intense with the rivastigmine      05/06/21 appt noted: Stopped rivastigmine  DT anxiety and worsening SI.  Was better the next day. This week had some SI.  Thinking about the prior relationship tends to lead to SI. Will take Ativan  when gets SI and it helps a little at 0.5 mg at a time. Plan: Consider fluvoxamine .  Yes this may help with obsessions on old GF and he failed other optioons. Start fluvoxamine  1/2 of 50 mg tablet and reduce paroxetine  to 1 and 1/2 of 20 mg tablets for 5 days, Then increase fluvoxamine  to 1 tablet daily and reduce paroxetine  to 1 tablet daily for 5 days, Then increase fluvoxamine  to 1-1/2 tablets daily and reduce paroxetine  to 1/2 tablet daily for 5 days, Then increase fluvoxamine  to 2 tablets daily and stop paroxetine    06/07/22 appt noted: Feels like the fluvoxamine  50 BID is too much.  On this a couple of weeks. Worries he might be worse with this with regard to SI.    He thinks it's worse with increase.   New neighbor who has a young boy. He worries that Georjean is triggering depression and intrusive SI/HI a couple of days later and lasts a little while. Has had anxiety turning into panic attacks.  This gets associated with depression and SI/HI.  These thoughts are intrusive and he doesn't want to act on them.  Can have HI even towards family  with whom he has no negative emotions.  Little things like a bump up in parking lot sets off his depression and anxiety and doesn't seem to handle things.   No sig akathisia.  In the past it's been continuous.   Awakens every 2-3 hours.  Usually goes back to sleep with meds. Has not had alcohol in 20 years and had some intrusive thoughts without drinking.   Plan: As soon as he feels comfortable increase fluvoxamine  to 50 mg AM and 100 mg PM for intrusive thoughts of HI/SI and obs on GF  07/06/22 appt noted: Increased fluvoxamine  to 150 mg daily and felt more agitated and some SI so reduced to 100 mg 4 days  ago and feels better so far.  Likes the fact it is good for anxiety and obs thoughts.  Wonders if there is a similar med.  Frustrated he can't get relief.  Some improvement in obs on exGF but not resolved. Continue other meds: lithium  CR 450 mg HS, lorazepam  0.5 mg QID, Memantine  10 BID, olanzapine  30 mg HS, quetiapine  200 mg HS Tingling and burning in legs at night but doesn't interfere with sleep. Got insurance to pay for [facility] and lost 13#.   No full panic lately. But some anxiety waves.   Still major problems with memory. Plan: Wean fluvoxamine  50 mg daily for a month then 1/2 daily for 2 weeks then stop it DT  NR.  Watch for more anxiety.  07/28/22 TC: Con Dawna DASEN, CMA  to Me     07/28/22  4:16 PM Note Mom called reporting patient has been confused, dizzy, and sluggish for the last 3 days. He is now staying with her. She had several questions in regards to this:   Lithium  level - she feels he needs one done, last result in Epic was 08/2021 that I see. She would like order sent to [facility] for tomorrow.   Patient is on Wegovy, has been for about a month, and she wonders if this could cause sx.   Patient has meclizine  and she is questioning if she should give that for his dizziness considering other sx.   She questions if perhaps he got up during the night and took additional medications, though doesn't know if this happened and if so what he took.    On 3/5 visit he was to wean fluvoxamine  to 50 mg qd for a month and then 1/2 tab for 2 weeks and then stop. Mom and pt unsure if he started the wean.    He has F/U 4/4.        Me  to Con Dawna DASEN, CMA   CC   07/28/22  5:44 PM Note Yes get lithium  level in the AM and don't take any lithium  tonight or in the morning before the blood test.     Georjean should not cause these sx except if he is losing wt quickly or is dehydrated it can cause lithium  toxicity and include these sx.  He needs to drink lots of water.   He should go  ahead and wean the fluvoxamine  as instructed in the last appt.   If the sx get worse, go to the ER.    Me   CC   07/29/22  6:49 PM Note RTC   Sx not as severe as when he had to go to hospital in the past.  [name] got Lithium  level today.   Speech is not good .  Confused at times,  but better today.  A little tremor .  A little balance problems.  No falls.  No diarrhea Took lihtium 450 last night. Mo says he got up in the middle of the night and moved things around last night and he doesn't rmember.   No change in night time meds.   Gradually reducing fluvoxamine .   No extra lorazepam .     Spoke with mother who verifies the same.   No med changes but push fluids.   Lorene Macintosh, MD, DFAPA      08/05/22 appt : with parents Pt hosp 08/02/22 with acute kidney injury and somewhat elevated lithium  level with some confusion.  Spoke with psych NP at hosp and lithium  was decreased to 450 mg HS. No pending appt known with medical doctors about kidney function. Don't feel good and having HI and SI and feels he needs lithium  for these issues.  Doesn't feel he should have been discharged.   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 No added meds at night but has been confused at night and getting up multiple times and doing things he doesn't remember.  This concerns parents.  Parents been up at night with him and he doesn't make sense.  Still groggy in the am per mo for an hour.   Plan: Plan: Get lithium  level and kidney test ASAP Stop fluvoxamine  Reduce lithium  300 mg capsule 1 at night Monday night: reduce to olanzapine  to 20 mg at night (stop the 10 mg tablet) and start clozapine  prescription. Monday 4/15 : increase clozapine  to 100 mg at night and reduce quetiapine  to 1/2 of 200 mg tablet and continue olanzapine  20 mg each evening.  08/01/22 & 08/11/22 ED for HI and SI  08/12/22 TC mother:  Mother, Patsy called. Per note made by Harlene Franchot PIETY. He was referred to [facility].Reporting that he did go up  there last night. This morning they called her and told her to come pick him up. They told her he wasn't having homicidal or suicidal thoughts and it would be four to five days of him waiting to go to a facility in [place]. They recommended him for PHP to start 4/16. She picked him up today. He is worried about these thoughts coming back up. Also, [facility] came out yesterday to assess him and offered counseling from [facility]. He wants to do counseling with them.  Any input on care he can receive?  He is so discouraged and wants to be safe.  (She notes that the [facility] gave him- Zyprexa  10mg  at 1:07am and again at 1:25am per the med list- based on his previous med list) He was supposed to be off of Zyprexa10mg . Has been on  Clozapine  since Monday. He did go ahead and take it before he left ( 2pills) so he hasn't missed a dose and did go get labwork done. Is wanting to continue the Clozapine , please check the labwork to be able to fill the 100mg  dose.    MD resp:  Continue all meds as RX except go ahead and increase clozapine  to 100 mg tablets, 1 each night as soon as he can get the 100 mg tablets from pharmacy.  RX sent.  Trying to speed up his chance of response by getting it to a hgiher dose.  Clozapine  is very effective for HI/SI.  If he gets worsening HI/SI in the interim it will not be caused by meds but just the natural waxing and waining of his sx.  Once clozapine  dose gets  high enough it should help but avg patient needs 300 mg so we have to just be patient.      08/20/22 appt:  Up to 100 mg clozapine  HS SE constipation and using Colace, Miralax .  Been to [facility] twice  since here.   Ongoing continuous HI/SI.   All I want to do is go to sleep to escape.  Dep causing the thoughts.   Taking Ativan  0.5 mg BID-QID.  He thinks mother not giving him enough of it.  He thinks anxiety triggers HI/SI.  He fears HI and hopes he's strong enough to reisist.  Not angry or wanting to hurt anyone in reality but has the intrusive  thoughts. Conc and memory still not good.   Sleep not much different.   Staying with parents currently. Dep gotten worse M worrying he's dependent on lorazepam .   Confusion at night is no longer happening.  Not wandering at night.  Plan: For severe constipation start Linzess  145 mg AM Get lithium  level and kidney test ASAP Stopped fluvoxamine  and confusion at night is gone  lithium  300 mg capsule 1 at night  olanzapine  to 20 mg at night  increase clozapine  to 150 mg at night for 3 nights then 200 mg HS   olanzapine  20 mg each evening. Continue lorazepam  0.5 mg QID, needs this and informed mother  09/07/22 appt noted: Psych meds: clozapine  250 MG HS, lithium  300 mg HS, lorazepam  0.5 mg BID-QID, olanzapine  20 .  Off Seroquel , linzess  290 mg daily, Miralax , Colace. Sleep 7-8 hours without change from before clozapine .   Mood is still very depressed and still gets anxiety. Still problems with constipation.   Dep is so much it is causing intrusive HI/SI.   No more bizarre behaviors at night.   Plan: Plan: Get lithium  level and kidney test ASAP  lithium  300 mg capsule 1 at night  olanzapine  to 20 mg at night  increase clozapine  to  300 mg HS as soon as tolerated and then 400 mg HS.  olanzapine  20 mg each evening. Continue lorazepam  0.5 mg QID, needs this and informed mother  10/07/22 appt noted: alone and with parents Rough month.  Altered voice for a couple of weeks or so..  Mind not good.  Feel out of sorts.  Jittery.  Constipation.  No appetite.  On Wegovy.  Saw GI doc.   May need to reduce Wegovy bc poor appetite and constipation.  Lost 25 #. Current psych med: clozapine  400 mg HS about a week or 2, olanzapine  20 mg HS, lithium  300 mg HS, lorazepam  0.5 mg QID. Reduced intrusive thoughts.  2 nights since here night eating.   Sleep 6 hours.  Limited napping during the day.   Anhedonia.  Lower motivation.  Tremor worse. M says he sleeps pretty well but awakens  BP is under  control. Plan Reduce lithium  to 150 mg capsule 1 at night DT level higher Reduce  olanzapine  to 10 mg at night  Continue clozapine  400 mg HS bc helping SI and HI and  Continue lorazepam  0.5 mg QID, needs this and informed mother  11/08/22 appt noted:  alone and with parents. Ongoing calls between visits Doesn't think clozapine  helping him sleep as well as olanzapine  for sleep or any other benefit. While at restaurant had intrusive HI/SI. Staying with parents seems to provoke intrusive HI.  Is grateful to them.  Sat woke up with them.  Sometimes goes away and sometimes not.  No know trigger and  no intent. Saw PCP.  Off Wegovy.  Started metformin . No effect noted from clozapine .   M limits his lorazepam .  PCP referring him to neuro to FU abnormal head scan.  He wonders if his speech and memory problems are related.   Plan: Cont clozapine  400 mg HS Check clozapine  level and then direct dosing.  11/16/22 TC from pt to disc level:  MD resp:  Clozapine  level ok but may benefit with increase to 500 mg nightly.  Tell him to increase with current supply.     12/06/22 TC from pt:  complaining of worsening intrusive ego-dystonic HI/SI without intent, desire or plan.   MD resp ok to increase clozapine  to 600 mg HS.  12/09/22 appt noted: Complaining of ongoing racing thoughts and worsening HI/SI without intent or desire or plan.  Racing thoughts chronic and other thoughts wax and wane.  Usually triggered if starts thinking of something negative Meds: clozapine  600 mg HS, lithium  150, lorazepam  0.5 mg QID, olanzapine  10 HS Sleep with EFA.   Will have trouble going to sleep sometimes with thoughts. Doesn't think clozapine  makes him sleepy.  Not really drowsy daytime. No dx OSA and no known apnea.  SE a little dizzy.  Constipation managed with BM every 2-3 days. Better than before.  PCP RX metformin  and it helped. Plan: Plan for intrusive HI/SI wean olanzapine  and trial of risperidone .  Reduce olanzapine   to 5 mg HS for 1 week then stop it.  Addressed his fears of not sleeping off olanzapine . Then if sleeping start risperidone .  01/11/23 appt noted:  also seen with parents Psych meds: clozapine  600, lorazepam  0.5 mg QID, No olanzpaine ,  lithium  150 mg daily, no risperidone  yet. Lost 45# on Wegovy down to 150#.   Still having intrusive HI/SI as noted before. Sleep no worse.  Takes a couple of hours after clozapine  to sleep.  EFA 2-3 times nocturia.   Still racing thoughts.  Ongoing px with conc, memory,  planning Scored 73 on 100 pt test for cognition.   Some trouble with articulation and speech at times.   If could get rid of racing and intrusive thoughts I'd be 100% better. Neuro says no stroke history and low B12 and workup ongoing at [facility], Dr. IZORA Fairly. Memory is still terrible.   Plan: Start risperidone  2 mg tablet, 1 at night for racing thoughts and intrusive homicidal and suicidal thoughts. Call in 3 weeks if not better and we will increase the dose  02/09/23 appt: seen also with parents , 60 min  Lost to 152#. Meds: as above, risperidone  incr to 4.5 mg HS for intrusive HI/SI SE drooling off an on.  Tremor mainly worse with a little more risperidone  HI/SI intrusive thoughts wax and wane.  Worse when lays down. Takes 2 hours to fall asleep.  Needs to get OOB when awakens.  6 hours sleep. No falls.   Would rather try to increase risperidone  than other options.    04/12/23 appt virtual:  seen alone CC:  Multiple phone calls about ongoing intrusive, obsessive, ego dystonic HI/SI thoughts without plan or intent. Didn't come to the office bc of these thoughts.  I know I don't want to do it.  Can last four hours.  Sees drug advertisements that talk about SI and that will trigger obsessions about it. Meds: clozapine  600 mg HS,  lithium  150 mg HS, lorazepam  0.5 mg TID, risperidone  increased to 8 mg and now back to 4mg  HS.  Neuro sched MRI tomorrow. Enough sleep 5-6 hours at night.  Not  napping.   Sometimes gets confused with med but not ususally.   No recent mania.   Depressed and anxious. Plan: AVS: Reduce risperidone  to 1/2 of 4 mg tablet in evening for 1 week then stop it. Reduce clozapine  to 5 tablets nightly Start fluvoxamine  100 mg tablet 1/2 tablet nightly for 1 week then 1 tablet nightly for obsessive, intrusive thoughts  05/25/23 appt in person:  not with parents, met with [facility] with 2 sz and then had psych hosp since here.  Another EEG 06/03/23.  Neuro DR.  Jhonny FATE Keppra .  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 Psych med: clozapine  reduced to 300 HS, stopped ? Risperidone  he's not sure, clonazepam  0.5 mg BID, lithium  150 HS, trazodone  50 prn rare.  Keppra  500 BID. Trazodone  good sleep med. SE drooling again.   Not sure any mental health effect of Keppra .   Denies any street drugs despite positive UDS for ecstasy. Overall still having intrusive HI/SI without intent or plan chronically but maybe is a bit better than before hosp stay. Sleep is better.  Lost 40# on Wegovy.  Plan no change  06/22/23 appt noted: EEG shows some epileptiform activity with FU pending with another 4 hour EEG pending. Meds unchanged Overall mental health about the same or a tad bit better.  Seems he can distract himself from intrusive thoughts and they are less common than they were.   CC mouth watering from med.  Choking middle of the night.  Atropine  helps some but has to be repeated and pain in the ass.   Sleep 6 hours.  Sometimes taking trazodone  50.  Sleep is not that bad.  Psych med: clozapine  reduced to 300 HS, , clonazepam  0.5 mg BID, lithium  150 HS, trazodone  50 prn rare.  Keppra  500 BID. Plan no changes  07/20/23 appt noted:  alone and with parents. Psych med: clozapine  reduced to 300 HS, , clonazepam  0.5 mg BID, lithium  150 HS, trazodone  50 prn rare.  Keppra  500 BID. B12 oral.  EEG sched for 08/2023; appt 08/25/23 Maybe a little better  but still same intrusive thoughts.  They cause him to have somatic anxiety like palpitations.  You know I don't want to do any of those thoughts.    Out of the blue.   SE still drooling.  Using drops some.  But not often. Anna Slayden therapist in [place].  Likes her.  She's good at teaching . Awakening 2-3 times night nocturia usually.  Back to sleep ok Parents think he is better with more interest. Seems less disturbed .  More outgoing and social and mother agrees. Switched lorazepam  to clonazepam  0.5 mg BID seems to be working better and more calming and lasts longer.  08/18/23 appt noted: Med: clozapine  reduced to 300 HS, , clonazepam  0.5 mg BID, lithium  150 HS, trazodone  50 prn rare.  Keppra  500 BID. B12 oral.  Appt next week with Dr. Loreli to review EEG. No med changes.  Still on above. SE drooling. No effect from atropine  2 drops . Mood not worse but not a whole lot better Anxiety still better with clonazepam  vs lorazepam . Sleep pretty good 5-6 hours and would like to sleep more.  Nocturia.   Not sedated from clozapine .  Rare naps. No other medical px except about EEG.   Low motivation bc doesn't feel like it.  So disgusted with lack of  response with meds.   Does limited chores.  Has cleaning lady.  At parent's house off and on. Go through the motions.  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  Just lay around.   Plan: No med changes.  Will wait to make med changes until after EEG work up completed.    09/19/23 appt noted:  disc plan with parents.  Med: clozapine  reduced to 300 HS, , clonazepam  0.5 mg BID, lithium  150 HS, trazodone  50 prn rare.  Keppra  500 BID. B12 oral.  Neuro  Dr Maree EEG spikes not likely to cause psych sx Frustrated with chronic psych sx dep.  Gets angry over the fact that no meds seem to help. Goes out to eat and enjoys food but doesn't feel like doing much.   No sig change in psych sx.  Chronic anxiety and dep.  Anxiety is better with clonazepam  than other meds.  Will  get triggered in loud places with agitation.  Low tolerance of stimulation. OCD therapist Anna Slayden , [place], said she couldn't help his chronic intrusive HI/SI.   Still has spells of intrusive HI/SI Plan no changes  10/24/23 appt noted:  parents here today Med: clozapine  reduced to 300 HS, , clonazepam  0.5 mg BID, lithium  150 HS, trazodone  50 prn occ.  Keppra  500 BID. B12 oral.  Atropine  drops at night.   No SZ in over 6 mos.  Not driving again yet. Trazodone  works when needed.  Likes it .   Some panic but less severe and frequent.  Not as bad.   SE no much except drooling ongoing.  Uses atropine  drops at night Don't like things I used to like but not really new.   Clozapine  good for sleep.  Still some intrusive thoughts but not as bad as they were. F 53 yr at [employer].  Generally dep is worse than anxiety.  Anhedonia and intrusive thoughts.  Plan: no changes  12/14/23 appt noted:  Med: clozapine   300 HS, , clonazepam  0.5 mg BID, lithium  150 HS, trazodone  50 prn occ.  Keppra  500 BID. B12 oral.  Atropine  drops at night.   Intrusive thoughts and SI gone down some.   Atropine  drops not working well enough so not using it.   Doesn't stop drooling at night.   SE drooling and constipation.  Seeing GI Chronic dep and anxiety .  Some ongoing anhedonia.  Would like more relief.  No SZ and no stroke like SX Trazodone  works when needed.  Some dep cycling without pattern or pcpt. M helps with meds.  BC is still forgetful   Decided more even anxiety benefit with clonazepam  vs lorazepam .  Anxiety less on clonazepam .  Psych med hx extensive including ECT and  Risperidone  Maxed out risperidone  8 mg without benefit for intrusive thoughts  Zyprexa  30 Latuda 80 which caused akathisia,  Vraylar, Rexulti, aripiprazole 20 mg with akathisia,  Seroquel  1000 mg,  InVega, Geodon , Saphris with side effects, symbyax, Fanapt NR .  Caplyta  SE and markedly worse. Clozapine  started end of  March 2024; highest dose lithium  1200 SE,  lamotrigine 300 mg, Depakote 2000 mg, Tegretol, Trileptal and several of these in combinations, gabapentin ,  N-acetylcysteine, Nuedexta,     Belsomra with no response,  Lunesta no response, trazodone  200 mg,  Xanax , clonazepam , lorazepam  less sedation. Buspirone  NR Clonidine  SE with 2 trials  Viibryd 40 mg for 3 months with diarrhea,  protriptyline with side effects,  Trintellix 20 mg,  Parnate 50 mg with  no response,   Emsam 12 mg for 2 months,   imipramine,  venlafaxine,   bupropion side effects,  Lexapro 20 mg, sertraline, paroxetine , Deplin, fluoxetine  80,  fluvoxamine  150 agitated; retrial ? Sz related.   Auvelity NR at one daily.  SE BID  methylphenidate 60 mg,  Vyvanse, Concerta, strattera, , modafinil,  Memantine  worsening SI? Donepezil , complained of HI, Exelon  3 BID rivastigmine  DT worsening SI  pramipexole,  amantadine ,  Patient prone to akathisia.  [facility]  HX ECT   Review of Systems:  Review of Systems  Constitutional:  Positive for appetite change and fatigue.  Cardiovascular:  Negative for chest pain and palpitations.  Gastrointestinal:  Positive for constipation.  Musculoskeletal:  Positive for back pain.  Neurological:  Positive for dizziness and tremors. Negative for weakness.       Fidgety  Psychiatric/Behavioral:  Positive for decreased concentration, dysphoric mood and suicidal ideas. Negative for agitation, behavioral problems, hallucinations, self-injury and sleep disturbance. The patient is nervous/anxious. The patient is not hyperactive.     Medications: I have reviewed the patient's current medications.  Current Outpatient Medications  Medication Sig Dispense Refill  . acetaminophen  (TYLENOL ) 500 MG tablet Take 1,000 mg by mouth every 6 (six) hours as needed for moderate pain.    . atropine  1 % ophthalmic solution Place 2 drops under the tongue 3 (three) times daily as needed.  2 mL 1  . cholecalciferol  (VITAMIN D3) 25 MCG (1000 UNIT) tablet Take 1,000 Units by mouth daily.    . Cyanocobalamin  (VITAMIN B-12 IJ) Inject as directed every 30 (thirty) days.    [name] esomeprazole (NEXIUM) 40 MG capsule Take 40 mg by mouth daily.    . fenofibrate  (TRICOR ) 145 MG tablet Take 145 mg by mouth daily.    . levETIRAcetam  (KEPPRA ) 500 MG tablet Take 1 tablet (500 mg total) by mouth 2 (two) times daily. 60 tablet 0  . levothyroxine  (SYNTHROID , LEVOTHROID) 75 MCG tablet Take 75 mcg by mouth daily before breakfast.    . metFORMIN  (GLUCOPHAGE -XR) 500 MG 24 hr tablet Take 500 mg by mouth 2 (two) times daily with a meal.    . tamsulosin  (FLOMAX ) 0.4 MG CAPS capsule Take 1 capsule (0.4 mg total) by mouth daily. 90 capsule 3  . clonazePAM  (KLONOPIN ) 0.5 MG tablet Take 1 tablet (0.5 mg total) by mouth 2 (two) times daily. 60 tablet 3  . cloZAPine  (CLOZARIL ) 100 MG tablet Take 3 tablets (300 mg total) by mouth at bedtime. 90 tablet 3  . LINZESS  290 MCG CAPS capsule Take 290 mcg by mouth daily.    . lithium  carbonate 150 MG capsule Take 1 capsule (150 mg total) by mouth at bedtime. 90 capsule 1  . lubiprostone  (AMITIZA ) 24 MCG capsule Take 24 mcg by mouth 2 (two) times daily with a meal. (Patient not taking: Reported on 12/14/2023)    . traZODone  (DESYREL ) 50 MG tablet Take 1 tablet (50 mg total) by mouth at bedtime as needed for sleep. 90 tablet 0   No current facility-administered medications for this visit.   Medication Side Effects: Other: mild sleepiness.  Occ twitches.\, tremor  Allergies:  Allergies  Allergen Reactions  . Meloxicam Other (See Comments)    Dizziness  . Zyprexa  [Olanzapine ] Other (See Comments)    Akathesia  . Nsaids Other (See Comments)    Hallucinations  . Ibuprofen Other (See Comments)    Can not take because taking lithium   . Prednisone Other (See Comments)  Can't sleep   . Wellbutrin [Bupropion] Other (See Comments)    Suicidal thoughts    Past Medical  History:  Diagnosis Date  . ADHD (attention deficit hyperactivity disorder)   . Anemia   . Anxiety   . Bipolar disorder (HCC)   . BPH (benign prostatic hyperplasia)   . Chronic kidney disease, stage 3b (HCC)   . Constipation   . DDD (degenerative disc disease), cervical   . Depression   . Deviated septum   . ED (erectile dysfunction)   . GERD (gastroesophageal reflux disease)   . Graves disease   . History of hiatal hernia   . Hypertension   . 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 Hypothyroidism   . Pneumonia   . PONV (postoperative nausea and vomiting)     Family History  Problem Relation Age of Onset  . Prostate cancer Neg Hx   . Bladder Cancer Neg Hx   . Kidney cancer Neg Hx     Social History   Socioeconomic History  . Marital status: Single    Spouse name: Not on file  . Number of children: Not on file  . Years of education: Not on file  . Highest education level: Not on file  Occupational History  . Not on file  Tobacco Use  . Smoking status: Former    Current packs/day: 0.00    Types: Cigarettes    Quit date: 2010    Years since quitting: 15.6    Passive exposure: Past  . Smokeless tobacco: Former    Types: Engineer, drilling  . Vaping status: Never Used  Substance and Sexual Activity  . Alcohol use: Not Currently  . Drug use: Never  . Sexual activity: Yes  Other Topics Concern  . Not on file  Social History Narrative  . Not on file   Social Drivers of Health   Financial Resource Strain: Low Risk  (11/08/2023)   Received from [facility]   Overall Financial Resource Strain (CARDIA)   . Difficulty of Paying Living Expenses: Not very hard  Food Insecurity: No Food Insecurity (11/08/2023)   Received from [facility]   Hunger Vital Sign   . Within the past 12 months, you worried that your food would run out before you got the money to buy more.: Never true   . Within the past 12 months,  the food you bought just didn't last and you didn't have money to get more.: Never true  Transportation Needs: No Transportation Needs (11/08/2023)   Received from [facility]   [facility] - Transportation   . In the past 12 months, has lack of transportation kept you from medical appointments or from getting medications?: No   . Lack of Transportation (Non-Medical): No  Physical Activity: Not on file  Stress: Stress Concern Present (08/12/2020)   Received from [facility] - Occupational Stress Questionnaire   . Feeling of Stress : Very much  Social Connections: Unknown (09/01/2021)   Received from [facility]   Social Network   . Social Network: Not on file  Intimate Partner Violence: Not At Risk (04/17/2023)   Humiliation, Afraid, Rape, and Kick questionnaire   . Fear of Current or Ex-Partner: No   . Emotionally Abused: No   . Physically Abused: No   . Sexually Abused: No    Past Medical History, Surgical history, Social history,  and Family history were reviewed and updated as appropriate.   Please see review of systems for further details on the patient's review from today.   Objective:   Physical Exam:  There were no vitals taken for this visit.  Physical Exam Constitutional:      General: He is not in acute distress.    Appearance: He is well-developed.  Musculoskeletal:        General: No deformity.  Neurological:     Mental Status: He is alert and oriented to person, place, and time.     Cranial Nerves: No dysarthria.     Motor: Tremor present.     Coordination: Coordination normal.     Comments: mild tremor  Psychiatric:        Attention and Perception: Attention and perception normal. He does not perceive auditory or visual hallucinations.        Mood and Affect: Mood is anxious and depressed. Affect is not labile, blunt, angry or tearful.        Speech: Speech normal. Speech is not slurred.         Behavior: Behavior normal. Behavior is not slowed. Behavior is cooperative.        Thought Content: Thought content is not delusional. Thought content does not include homicidal or suicidal ideation. Thought content does not include suicidal plan.        Cognition and Memory: Cognition normal. He exhibits impaired recent memory.        Judgment: Judgment normal.     Comments: Insight fair Chronically talkative .directable. But chronically mildly pressured but less so and calmer than usual. He is chronically anxious  .  No manic signs noted. Anhedonia  Intrusive obsessions episodic HI/SI are better since Dec but not gone Alert and oriented.      Lab Review:     Component Value Date/Time   NA 144 04/17/2023 0314   NA 135 10/20/2022 1123   NA 142 03/20/2014 0514   K 4.4 04/17/2023 0314   K 4.1 03/20/2014 0514   CL 109 04/17/2023 0314   CL 112 (H) 03/20/2014 0514   CO2 28 04/17/2023 0314   CO2 26 03/20/2014 0514   GLUCOSE 92 04/17/2023 0314   GLUCOSE 99 03/20/2014 0514   BUN 16 04/17/2023 0314   BUN 22 10/20/2022 1123   BUN 7 03/20/2014 0514   CREATININE 1.43 (H) 04/17/2023 0314   CREATININE 0.78 03/20/2014 0514   CALCIUM  8.7 (L) 04/17/2023 0314   CALCIUM  9.5 03/20/2014 0514   PROT 6.5 04/14/2023 1047   PROT 7.3 03/18/2014 1459   ALBUMIN 3.8 04/14/2023 1047   ALBUMIN 3.2 (L) 03/18/2014 1459   AST 21 04/14/2023 1047   AST 16 03/18/2014 1459   ALT 10 04/14/2023 1047   ALT 16 03/18/2014 1459   ALKPHOS 39 04/14/2023 1047   ALKPHOS 105 03/18/2014 1459   BILITOT 0.3 04/14/2023 1047   BILITOT 0.3 03/18/2014 1459   GFRNONAA 56 (L) 04/17/2023 0314   GFRNONAA >60 03/20/2014 0514   GFRAA 54 (L) 04/18/2019 1612   GFRAA >60 03/20/2014 0514       Component Value Date/Time   WBC 4.1 11/16/2023 1304   WBC 6.5 04/21/2023 0946   RBC 4.19 11/16/2023 1304   RBC 3.92 (L) 04/21/2023 0946   HGB 12.9 (L) 11/16/2023 1304   HCT 39.7 11/16/2023 1304   PLT 364 11/16/2023 1304   MCV 95  11/16/2023 1304   MCV 89 03/20/2014 0514   MCH  30.8 11/16/2023 1304   MCH 30.9 04/21/2023 0946   MCHC 32.5 11/16/2023 1304   MCHC 32.7 04/21/2023 0946   RDW 12.5 11/16/2023 1304   RDW 12.5 03/20/2014 0514   LYMPHSABS 2.0 11/16/2023 1304   LYMPHSABS 3.2 03/20/2014 0514   MONOABS 0.4 04/21/2023 0946   MONOABS 1.0 03/20/2014 0514   EOSABS 0.4 11/16/2023 1304   EOSABS 0.4 03/20/2014 0514   BASOSABS 0.1 11/16/2023 1304   BASOSABS 0.1 03/20/2014 0514    Lithium  Lvl  Date Value Ref Range Status  10/13/2023 0.2 (L) 0.5 - 1.2 mmol/L Final    Comment:    A concentration of 0.5-0.8 mmol/L is advised for long-term use; concentrations of up to 1.2 mmol/L may be necessary during acute treatment.                                  Detection Limit = 0.1                           <0.1 indicates None Detected    08/12/21 lithium  level 0.9 on 450 mg.  lithium  level Sept 0.8.   lithium  level July 27, 2018 was normal at 1.0.   Lithium  level [facility] October 03, 2018 = 1.2. Said he got lithium  level at [facility] as requested.  Labs not in Epic.  Recent lipids ok except higher TG than usual.  Normal A1C.  11/10/22 Checked clozapine  level on 400 mg HS= cloz 733+norclozapine 294 = total 1027  06/07/23  Vitamin B12 >300 pg/mL 848  On oral supplement  Assessment: Plan:    Landry was seen today for follow-up, depression, anxiety and sleeping problem.  Diagnoses and all orders for this visit:  Severe bipolar I disorder, current or most recent episode depressed (HCC) -     lithium  carbonate 150 MG capsule; Take 1 capsule (150 mg total) by mouth at bedtime. -     cloZAPine  (CLOZARIL ) 100 MG tablet; Take 3 tablets (300 mg total) by mouth at bedtime. -     clonazePAM  (KLONOPIN ) 0.5 MG tablet; Take 1 tablet (0.5 mg total) by mouth 2 (two) times daily.  GAD (generalized anxiety disorder) -     clonazePAM  (KLONOPIN ) 0.5 MG tablet; Take 1 tablet (0.5 mg total) by mouth 2 (two) times daily.  Panic disorder with  agoraphobia -     clonazePAM  (KLONOPIN ) 0.5 MG tablet; Take 1 tablet (0.5 mg total) by mouth 2 (two) times daily.  Insomnia due to mental condition -     traZODone  (DESYREL ) 50 MG tablet; Take 1 tablet (50 mg total) by mouth at bedtime as needed for sleep.  Other obsessive-compulsive disorders -     clonazePAM  (KLONOPIN ) 0.5 MG tablet; Take 1 tablet (0.5 mg total) by mouth 2 (two) times daily.  Mild cognitive impairment  Sialorrhea  Drug-induced constipation  Low serum vitamin B12  Seizure (HCC)  Long term current use of clozapine   Lithium  use -     lithium  carbonate 150 MG capsule; Take 1 capsule (150 mg total) by mouth at bedtime.      60 min appt with pt  needs a lot of time   .   Seen alone and then with parents for additional info.  Pt is fair historian.  Chronic TR bipolar mixed and chronic anxiety.  He usually has mixed bipolar symptoms which we have not been able to  completely eliminate.  See long list of meds tried.   He is  chronically unstable .  Mood is relatively stable except for the severe intrusive, ego-dystonic persistent obsessive HI/SI without intent or plan.  No better no worse.  Residual anhedonia. Hx chronic intrusive ego dystonic thoughts HI/SI.  More like obsessions than true HI/SI.   No recent mania.  Chronic dep and anxiety.  But is a little better since being on Keppra .   Psych hosp 08/02/22 with SI and acute kidney injury and elevated lithium . Processed this and disc goal of trying to stop lithium  but risk of worsening SI.  He has chronic SI and there's risk of this worsening off lithium .  Lithium  and clozapine  are the most effective meds for SI.    hosp for SZ episodes.  Fluvox stopped and clozapine  reduced to 300 mg HS.   Was RX risperidone  but he stopped that after DC.  It was ineffective in at least 2 trials.  We discussed the short-term risks associated with benzodiazepines including sedation and increased fall risk among others.  Discussed  long-term side effect risk including dependence, potential withdrawal symptoms, and the potential eventual dose-related risk of dementia.  But recent studies from 2020 dispute this association between benzodiazepines and dementia risk. Newer studies in 2020 do not support an association with dementia.  Unfortunately due to the severity of set his symptoms polypharmacy is a necessity.    Lithium  being used bc chronic SI and death thoughts.  Counseled patient regarding potential benefits, risks, and side effects of lithium  to include potential risk of lithium  affecting thyroid  and renal function.  Discussed need for periodic lab monitoring to determine drug level and to assess for potential adverse effects.  Counseled patient regarding signs and symptoms of lithium  toxicity and advised that they notify office immediately or seek urgent medical attention if experiencing these signs and symptoms.  Patient advised to contact office with any questions or concerns.  Reduced lithium  to 150 mg daily Dt hx toxicity Checked lithium  level and BMP and level 1.1 on 300 mg Daily so reduced again to 150 mg daily and level 0.4 04/13/24 Call if death thoughts worsen or worsening SI.  Disc clozapine  and it's level..  Disc data showing less SI with it but he's had no changes.    Disc risk in detail including low WBC with complication, myocarditis.  Extensive discussion of CBC monitoring.  Disc going higher despite level and checking ECG bc cardiac risk at higher doses.   Disc this again bc taking Wegovy might help him to tolerate it better.   clozapine  600 mg HS was Started 12/07/22. Started clozapine  08/05/22. 11/10/22 Checked clozapine  level on 400 mg HS= cloz 733+norclozapine 294 = total 1027 Reduced to 300 mg HS at the hospital. CBC every 12 weeks, now that on it over a year.  And DT recent FDA changes in recommendation.  Use the atropine  drops prn 2-4 on each side.  It's managed.  Did he take duloxetine?  Might help  depression more.  Consider retry pramipexole off label  Consider Qelbree.  Lamotrigine not a good option.    Consider trial Fanapt at followup  Switched lorazepam  to clonazepam  0.5 mg BID  working better and more calming and lasts longer. M administers  For MCI had to stop rivastigmine  DT worsening SI. Neuro [facility] Dr. IZORA Fairly, [facility] in progress.   EEG on 06/03/23 bc recent sz 08/25/23 appt Dr. Fairly:  Seizure-like activity Intermittent seizure-like discharges from  the left temporal lobe on long-term EEG. No breakthrough seizures since last visit. Currently on levetiracetam  500 mg twice daily. Requests a once-daily regimen. Epileptiform discharges necessitate continued anti-seizure medication to mitigate recurrent seizure risk. Discharges are insufficient to cause psychiatric issues. Levetiracetam  XR offers once-daily dosing convenience without altering the total daily dose. - Switch to levetiracetam  1000 mg XR once daily, to be taken at nighttime.    We discussed the short-term risks associated with benzodiazepines including sedation and increased fall risk among others.  Discussed long-term side effect risk including dependence, potential withdrawal symptoms, and the potential eventual dose-related risk of dementia.  But recent studies from 2020 dispute this association between benzodiazepines and dementia risk. Newer studies in 2020 do not support an association with dementia.  Discussed safety plan at length with patient.  Advised patient to contact office with any worsening signs and symptoms.  Instructed patient to go to the [facility] emergency room for evaluation if experiencing any acute safety concerns, to include suicidal intent.  He commits to safety. His HI/SI that he complains about are intrusive ego-dystonic obsessions and not true HI SI in that there's no desire or intent ever.  Disc Ozempic and Monjauro for weight loss.  Has had weight gain from meds as a contributor. Lost 45# with  Wegovy and 150#.    Needs to keep up activity as much as possible for depression.  For severe constipation seeing GI  Had constipation before clozapine  and GI appt in June started Amitza  which helped some. Needs to push fluids.    Historically he has not done well with serotonergic medicines which would typically be used for obsessive thoughts.  But we are running out of options.   Repeat ECT not good option bc SZ for TR sx.   Disc memory concerns and mother's memory concerns bc had it before.  Disc counseling.  [email or web address] for therapist.  Most effective therapist will need to be expert in CBT.  He has one now.  Retry Vraylar 1.5 mg every other day  FU 4 weeks   Lorene Macintosh, MD, DFAPA  Future Appointments  Date Time Provider Department Center  12/28/2023  2:00 PM Francisca Redell BROCKS, MD [facility] None  01/18/2024  1:30 PM Cottle, Lorene KANDICE Raddle., MD [facility] None  02/15/2024  1:00 PM Cottle, Lorene KANDICE Raddle., MD [facility] None    No orders of the defined types were placed in this encounter.      -------------------------------

## 2023-12-15 LAB — CBC WITH DIFFERENTIAL/PLATELET
Basophils Absolute: 0.1 x10E3/uL (ref 0.0–0.2)
Basos: 1 %
EOS (ABSOLUTE): 0.4 x10E3/uL (ref 0.0–0.4)
Eos: 9 %
Hematocrit: 40.1 % (ref 37.5–51.0)
Hemoglobin: 13.3 g/dL (ref 13.0–17.7)
Immature Grans (Abs): 0 x10E3/uL (ref 0.0–0.1)
Immature Granulocytes: 0 %
Lymphocytes Absolute: 2.1 x10E3/uL (ref 0.7–3.1)
Lymphs: 45 %
MCH: 31.3 pg (ref 26.6–33.0)
MCHC: 33.2 g/dL (ref 31.5–35.7)
MCV: 94 fL (ref 79–97)
Monocytes Absolute: 0.2 x10E3/uL (ref 0.1–0.9)
Monocytes: 5 %
Neutrophils Absolute: 1.9 x10E3/uL (ref 1.4–7.0)
Neutrophils: 40 %
Platelets: 371 x10E3/uL (ref 150–450)
RBC: 4.25 x10E6/uL (ref 4.14–5.80)
RDW: 12.3 % (ref 11.6–15.4)
WBC: 4.6 x10E3/uL (ref 3.4–10.8)

## 2023-12-23 ENCOUNTER — Telehealth: Payer: Self-pay | Admitting: Psychiatry

## 2023-12-23 NOTE — Telephone Encounter (Signed)
 Noted.  Yes, stop Vraylar.  We won't start anything new right now.  Need to let that get out of his system

## 2023-12-23 NOTE — Telephone Encounter (Signed)
 Patient notified of recommendations.

## 2023-12-23 NOTE — Telephone Encounter (Signed)
 Pt reports he took Vraylar 4 days, W-F-M-W and he started having SI on Tuesday.  He reports sx were less yesterday and a little better today, has not taken it today. He said you told him he shouldn't have SI with it. No other sx reported. The suicidal thoughts were no worse than what he has reported previously, but said he is pretty good at gaging his sx and was afraid to continue the medication. Reports he feels so bad all the time and he is anxious to feel better. Has FU 9/17.

## 2023-12-23 NOTE — Telephone Encounter (Signed)
 Pt called and said that he saw dr. Geoffry last wed. Hestarted on vraylar on Wednesday and took it Friday and then mon and wed of this week. He started having suicidal thoughts so he stopped the medicine. He just wanted dr. Geoffry to be aware

## 2023-12-28 ENCOUNTER — Ambulatory Visit: Admitting: Urology

## 2023-12-28 VITALS — BP 113/74 | HR 111 | Ht 69.0 in | Wt 150.0 lb

## 2023-12-28 DIAGNOSIS — Z125 Encounter for screening for malignant neoplasm of prostate: Secondary | ICD-10-CM

## 2023-12-28 DIAGNOSIS — N401 Enlarged prostate with lower urinary tract symptoms: Secondary | ICD-10-CM

## 2023-12-28 DIAGNOSIS — R3914 Feeling of incomplete bladder emptying: Secondary | ICD-10-CM

## 2023-12-28 DIAGNOSIS — N529 Male erectile dysfunction, unspecified: Secondary | ICD-10-CM | POA: Diagnosis not present

## 2023-12-28 LAB — BLADDER SCAN AMB NON-IMAGING: Scan Result: 0

## 2023-12-28 MED ORDER — TAMSULOSIN HCL 0.4 MG PO CAPS: 0.4000 mg | ORAL_CAPSULE | Freq: Every day | ORAL | 3 refills | Status: AC

## 2023-12-28 MED ORDER — SILDENAFIL CITRATE 100 MG PO TABS
100.0000 mg | ORAL_TABLET | Freq: Every day | ORAL | 6 refills | Status: AC | PRN
Start: 2023-12-28 — End: ?

## 2023-12-28 NOTE — Progress Notes (Signed)
   12/28/2023 2:36 PM   Albert [name] Dec 31, 1965 [id number]  Reason for visit: Follow up BPH, ED, history of microscopic hematuria, PSA screening  HPI: 58 year old male with bipolar disorder who presents for follow-up of the above issues.  He has a long history of persistent microscopic hematuria negative work-up in 2012 with Dr. Ike.  He has refused repeat work-ups numerous times.  He denies any gross hematuria.  He is currently taking Flomax  0.4 mg nightly which he feels greatly improves his urinary symptoms.  PVR is normal today at 0 mL.  He has some mild postvoid dribbling that is minimally bothersome.    Urinalysis March 2025 with no microscopic hematuria.  He has ED that is well controlled with 100 mg sildenafil  on demand.    PSA July 2025 normal and stable at 0.41.  Flomax  and sildenafil  refilled RTC 1 year PVR, symptom check Can continue PSA screening every other year per guideline recommendation  [name], MD  [facility] [address] [place], [place] [address] [phone]

## 2023-12-29 ENCOUNTER — Ambulatory Visit: Payer: Self-pay | Admitting: Urology

## 2024-01-03 ENCOUNTER — Other Ambulatory Visit: Payer: Self-pay | Admitting: Psychiatry

## 2024-01-03 DIAGNOSIS — K117 Disturbances of salivary secretion: Secondary | ICD-10-CM

## 2024-01-10 ENCOUNTER — Ambulatory Visit: Admitting: Psychiatry

## 2024-01-11 ENCOUNTER — Other Ambulatory Visit: Payer: Self-pay | Admitting: Psychiatry

## 2024-01-12 LAB — CBC WITH DIFFERENTIAL/PLATELET
Basophils Absolute: 0.1 x10E3/uL (ref 0.0–0.2)
Basos: 2 %
EOS (ABSOLUTE): 0.4 x10E3/uL (ref 0.0–0.4)
Eos: 10 %
Hematocrit: 38.3 % (ref 37.5–51.0)
Hemoglobin: 12.6 g/dL — ABNORMAL LOW (ref 13.0–17.7)
Immature Grans (Abs): 0 x10E3/uL (ref 0.0–0.1)
Immature Granulocytes: 0 %
Lymphocytes Absolute: 2 x10E3/uL (ref 0.7–3.1)
Lymphs: 46 %
MCH: 30.9 pg (ref 26.6–33.0)
MCHC: 32.9 g/dL (ref 31.5–35.7)
MCV: 94 fL (ref 79–97)
Monocytes Absolute: 0.3 x10E3/uL (ref 0.1–0.9)
Monocytes: 6 %
Neutrophils Absolute: 1.5 x10E3/uL (ref 1.4–7.0)
Neutrophils: 36 %
Platelets: 396 x10E3/uL (ref 150–450)
RBC: 4.08 x10E6/uL — ABNORMAL LOW (ref 4.14–5.80)
RDW: 12.1 % (ref 11.6–15.4)
WBC: 4.2 x10E3/uL (ref 3.4–10.8)

## 2024-01-18 ENCOUNTER — Encounter: Payer: Self-pay | Admitting: Psychiatry

## 2024-01-18 ENCOUNTER — Ambulatory Visit (INDEPENDENT_AMBULATORY_CARE_PROVIDER_SITE_OTHER): Admitting: Psychiatry

## 2024-01-18 ENCOUNTER — Telehealth: Payer: Self-pay

## 2024-01-18 DIAGNOSIS — F411 Generalized anxiety disorder: Secondary | ICD-10-CM

## 2024-01-18 DIAGNOSIS — E538 Deficiency of other specified B group vitamins: Secondary | ICD-10-CM

## 2024-01-18 DIAGNOSIS — F428 Other obsessive-compulsive disorder: Secondary | ICD-10-CM

## 2024-01-18 DIAGNOSIS — G3184 Mild cognitive impairment, so stated: Secondary | ICD-10-CM

## 2024-01-18 DIAGNOSIS — F5105 Insomnia due to other mental disorder: Secondary | ICD-10-CM | POA: Diagnosis not present

## 2024-01-18 DIAGNOSIS — F4001 Agoraphobia with panic disorder: Secondary | ICD-10-CM | POA: Diagnosis not present

## 2024-01-18 DIAGNOSIS — F314 Bipolar disorder, current episode depressed, severe, without psychotic features: Secondary | ICD-10-CM

## 2024-01-18 DIAGNOSIS — K5903 Drug induced constipation: Secondary | ICD-10-CM

## 2024-01-18 DIAGNOSIS — Z79899 Other long term (current) drug therapy: Secondary | ICD-10-CM

## 2024-01-18 DIAGNOSIS — K117 Disturbances of salivary secretion: Secondary | ICD-10-CM

## 2024-01-18 MED ORDER — CLOZAPINE 100 MG PO TABS: 300.0000 mg | ORAL_TABLET | Freq: Every day | ORAL | 0 refills | Status: DC

## 2024-01-18 MED ORDER — CLOZAPINE 100 MG PO TABS: 300.0000 mg | ORAL_TABLET | Freq: Every day | ORAL | 0 refills | Status: AC

## 2024-01-18 NOTE — Telephone Encounter (Signed)
 Pt was seen today and Rx for clozapine  was sent to [facility]. Pharmacy called to say they have never filled it and don't have the medication. Called patient and he asked to send to [facility] where he has previously gotten it. Canceled at [facility] and resent to [facility].

## 2024-01-18 NOTE — Patient Instructions (Addendum)
 Fanapt 1 mg nightly for 3 nights ,  then 2 mg nightly for 3 nights ,  then 3 mg nightly for 3 nights,  then 4 mg nightly

## 2024-01-18 NOTE — Progress Notes (Signed)
 Albert [name] [id number] 1966/03/15 58 y.o.    Subjective:   Patient ID:  Albert [name] is a 58 y.o. (DOB 30-Aug-1965) male.  Chief Complaint:  Chief Complaint  Patient presents with   Follow-up   Depression   Anxiety   Manic Behavior      Albert [name] presents to the office today for follow-up of mixed bipolar, anxiety and still grieving stress of breakup.  He requires frequent follow-up because of long-term symptoms.  At visit October 25, 2018.  We made several medicine changes because of his concerns about sleep and other issues.  We increased olanzapine  back to 7.5 mg bc not sleeping as well at 5 mg.  He has to have the prescription for quetiapine  written for up to 3 tablets at night in case insomnia is worse because insomnia makes his mood disorder so much worse.  We discussed that was above the usual max but the request was granted given his treatment resistant status. We also reduce lithium  from 900 mg daily to 750 mg daily to try to reduce tremor and muscle twitches.  At  visit March 28, 2019.  Fluoxetine  was increased to 40 mg daily.   Early January 2021 increased to 60 mg daily.  No SE.  seen June 28, 2019.  No further meds were changed except olanzapine  was increased back to 10 mg daily to see if if he could get additional benefit per his request.  Covid vaccinated.  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  Then had cholecystectomy.  April 2021 appt with the following noted: 2 episodes night sweats lately.  Memory has been very bad lately.  Repeatedly asks mother questions. Still  tend to stay in his room and his bed.  Still has rapid cycling mood swings.  Maybe some better with increase in olanzapine  to 10 and tolerating itl.  Would like to get out more but can't DT Covid.  Can perform necessary chores.  Will get out of the house when he can.  No change in death thoughts and anxiety in intensity but is better with frequency.  Usually comes and goes in waves but more persistent.   Consistent with meds.  Ativan  not helping anxiety very dramatically but he's not sure.  Fidgety.  No trigger other than still grieving relationship and can't get it out of his head.  Poor energy, concentration and more forgetful. In bed more and less active.  Sleep ok lately which is unusual.  Can concentrate on financial matters and stock market at times.  Tolerating meds.  Still some intrusive SI without reason. Less frequent obsessive thoughts about broken relationship and has been doing this for months.  Can't let it go.  Loop.   No unusual stress even with the family who is supportive. Likes the benefit that Zyprexa  gives. No sleepwalking nor falling nor odd behavior.  No history of sleep walking.  He understands this may recur with an increase.  Also wants option to rarely take extra quetiapine  300 for sleep prn. Plan:  Try to reduce lorazepam  if possible.  10/22/2019 appointment with the following noted: Continues fluoxetine  60, lithium  600 mg, lorazepam  2 mg AM and HS and 1 mg midday, olanzapine  10, quetiapine  600 mg HS. Still anxious chronically and including driving in crowded spaces.  Doesn't thing Ativan  helps as well as Xanax  but less cognitive problems. Sleep is not as good.  Occ EFA.  More EMA and wanting to do things in the middle of  the night but this is not typical.  Wonders about why that happens.  Some napping.   Tolerating meds.  Asks about weight loss meds.  Disc this in detail.   Plan no changes except OK meclizine  prn vertigo.  02/11/20 appt with the following noted: Able to gradually reduce lorazepam  to 1 mg AM and HS. More depressed over time and less interested in things and less interest in going out but does with his parents. Has reduced from 800 to 600 mg HS with some awakening but usually able to go back to sleep.Albert [name]  Spending a good amount of time in bed bc watches TV in bedroom.  Parents watch TV in different part of the house.  No mood swings he notices.   Concerns  about weight gain about 200#. Likes olanzapine 's benefit for sleep. Plan: Trial for TRD to  Increase fluoxetine  to 80 mg daily  to use the combo with olanzapine  for TR bipolar depression.   04/21/20 appt with following noted: I thought in beginning some benefit with fluoxetine .  Lifelong negative thinking continues.   Not much bipolar.  Reads a lot on bipolar.   Still tired and anxious a little more may be seasonal.  No familial stressors.  Tends to lay down in afternoon.  Anxiety not over anything in particular.   No SE with fluoxetine . Took meclizine  prn.  Easily motion sick.    Asks questions about newer drugs for bipolar like Caplyta . Plan:  No med changes  06/30/20 appt noted: Tired of wearing masks.  Vaccinated.  Asked questions about when this will end with mask mandate.  Mood up and down some since here and getting up 2-3 times.  Disc awakening problems.  Trying to do better bc night eating some.   Mood is about average today so far.  Albert [name] out with friends for breakfast.  Recognizes activity helps mood.   3 days in a row napped a lot in the afternoon.   Bed is a comfort place.  Gets bored and hard to motivate.  Tries to stay away from night eating and spending.   More depressed when alone and wonders about med changes. Plan: Failed response to olanzapine  + fluoxetine  for TR bipolar depression.   Reduce fluoxetine  to 1 daily and reduce olanzapine  to one half nightly for 10 days and then stop it. Then start Caplyta  1 daily  08/27/2020 appointment with the following noted: Patient decompensated with the above switch to Caplyta  with intrusive suicidal thoughts and had to be hospitalized for psychiatric reasons.  Multiple phone calls with family members since that time.  Spoke with the psychiatric nurse practitioner with the decision to restart olanzapine  in place of the Caplyta  given the patient was more stable while on the combination of Seroquel  and olanzapine  than with the  Caplyta . Caplyta  triggered HI/SI and depressed and confused on it, and agitated and still has some of it now. No akathisia. Frustrated with lack of therapy at the hospital.   Hydroxyzine  didn't help jitteriness.  Feels some better.  Fleeting SI & HI but not obsessive like it was prior to hospitalization.  Feels a little amped up and nervous.  Scared of what could happen.  Apprehensive being here talking about things. 5-6 hours sleep last night and would like to have more. At mom and dad's house right now and up more in the day. Inadvertently stopped lorazepam  abruptly by mistake likely contributing to shakiness. Still some memory issues.  Trying to stay out of  bed when watching TV.  Some awakening but managing. Occ fleeting SI and distracts himself.   Always spends a lot of time just laying around.   Can have anxiety for no reason like coming here, and varies in intensity without pattern.      Less panic than in the past.  Some chronic depression and hyperactivity and loudness and hyperverbal.  Usually back to sleep.  Total 6 hours but some napping, awakens 2-4 times nightly but back to sleep..  Taking quetiapine  600 mg HS.  still lacks interest and motivation.  Can follow a TV show if interested. Plan: Restart lorazepam  1 mg in the morning, 1 mg in the afternoon and 1 mg at night for anxiety Stop hydroxyzine   Increase olanzapine  to 1 and 1/2 of 10 mg tablets in evening  09/05/2020 appt noted: seen with parents today at his request Made med changes noted and tolerated the changes Better than I did last week. Now only fleeting HI/SI and no where near what it was.  Sleeping better.  Still not a lot of energy.  Anhedonia.  Less agitation.  A lot of anxiety all the time but it's helping.  Would like more energy and motivation.   Doesn't handle stress well. Parents note he's less shakey.  He's still staying with his parents.  Only driven once since this happened and F wants him to be able to drive and  function more independently.  M agrees he's better.  Memory is better but not normal.  Primarily STM problems No akathisia with olanzapine  this time.    Administering his own meds but mo watched. Slept 8 hours last night. Plan consider further reductions in quetiapine   10/17/20 appt noted: Stayed on Seroquel  600 HS and olanzapine  15. Olanzapine  seemed to do most for the sleep.  Also helping eliminated HI/SI for the most part.  Wants to try to increase it.  Depression is much better with less rumination also.  Fears akathisia at higher dose as in the past. Also on fluoxetine  40.   Plan: Reduce Seroquel  to 1 and 1/2 tablets at night Increase olanzapine  to 1 of the 15 mg tablet and 1/2 of the 5 mg tablet for 1 week,  Then, if tolerated increase the olanzapine  to 20 mg or 1 of the 15 mg tablets and 1 of the 5 mg tablets. If possible then also reduce quetiapine  to 1 tablet at night.  11/05/2020 appointment with the following noted: Up to olanzapine  20 mg hs for 3 days.  Reduced Serooquel to 450 mg HS.  Didn't try to go lower. Had hiatal hernia and umbilical hernia surgery recently with no problem.   No akathisia so far. Sleep ok with meds so far changes.  No mood changes yet but overall likes the smoothness of olanzapine  and helping sleep without knociing him out. Still some occ HI/SI, he thinks bc of anxiety generally.  Easily anxious. Plan: Continue olanzapine  20 mg nightly longer to give it time to help mood sx reduce quetiapine  to 1 tablet at night to minimize polypharmacy and reduce risk of akathisia.  01/07/21 appt noted: Able to reduce quetiapine  300 mg HS ok with decent sleep but still wakes a lot.  No AM hangover.  Don't have a lot to do.  Can enjoy going out and doing things.  But doesn't do it longterm.  Family is doing good.  Father started year 81 at his job.   Tolerating the meds well.  May wake more with quetiapine   reduction. Lost 10#. Wonders if could try other meds he didn't  tolerate before bc now can tolerate olanzapine  and couldn't tolerate it in the past DT akathisia. Mood never great but can swing into depresssion without reason.   Plan: Continue olanzapine  20 mg nightly longer to give it time to help mood sx reduce quetiapine  to 1/2 of the 300 mg  tablet at night to minimize polypharmacy and reduce risk of akathisia.  02/02/2021 appointment with the following noted: Tried reduced quetiapine  to 150 mg and after a week had withdrawal sx including jittery and SI and he increased to 250 mg daily.  Felt better after increasing to 300 mg daily and last week reduced to 250 mg daily.  After increase quetiapine  felt better in a few days.  Seems more alert with less quetiapine  but can't tolerate a quick withdrawal.   Some awakening.  Plan: Reduce quetiapine  by 50 mg every 2-4 weeks.  03/09/2021 phone call: Pt called and said that he is weaning off the seroquel . He was down to 200 mg of the seoquel and he started having withdrawal symptoms and sucidal thoughts. He went back up to 250 mg because he knew he was good on that.    Pt stated he starting back taking 250 mg on Saturday due to the symptoms he was having.Now he is no longer having suicidal thoughts but still very jittery. MD: He may just need to taper slower than most people.  If he can tolerate the 250 mg Seroquel  with the jitteriness it should get better over the next 1 to 2 weeks.  Then he can try going down again by 50 mg at a time.  He probably needs to go down by just 50 mg every 2 to 4 weeks and wait till he fully adjusts to each reduction before he reduces again.  03/17/21 appt noted: Hernia surgery 2 weeks ago. Seroquel  250 mg for a month then 200 mg daily and had SI and increased again to 300 mg daily.  Would love to get off it.  Withdrawal triggers intrusive HI/SI but otherwise doesn't have them No SE. Sleep is OK but not enough REM sleep.  Satisfied with other meds.   No current HI/SI.  No paranoia.  Still  has anxiety and taking Ativan  every 6 hours.  It works but doesn't cure it. Chronic anxiety and ask about ketamine for chronic depression. Plan: Because he had WD reducing from 250 to 200 mg daily will have to go slowly. Reduce quetiapine  by 25 mg every 2-4 weeks. Reduce Seroquel  to 250 mg for 2 weeks, Then reduce to 225 mg for 2 weeks, Then reduce to 200 mg for 2 weeks, Then reduce to 175 mg for 2 weeks, Then reduce to 150 mg Also: Buspirone  30 mg tablets for anxiety Start 1/3 tablet twice daily for 1 week Then increase to two thirds twice daily for 1 week Then increase to 1 tablet twice daily  04/15/2021 appointment with the following noted: Reduced Seroquel  to 200 mg HS and increased buspirone  to 30 mg BID STM px and wants to try to reduce Ativan  to see if it is better.  But fears with drawal sx. Parents complain about his memory. Less HI and SI since here. Less depressed. Plan: Buspirone  30 mg tablets for anxiety Start 1/3 tablet twice daily for 1 week Then increase to two thirds twice daily for 1 week Then increase to 1 tablet twice daily Continue olanzapine  20 mg nightly longer to give  it time to help mood sx Because he had WD reducing from 250 to 200 mg daily will have to go slowly. Reduce quetiapine  by 25 mg every 2-4 weeks. Reduce Seroquel  to 250 mg for 2 weeks, Then reduce to 225 mg for 2 weeks, Then reduce to 200 mg for 2 weeks, Then reduce to 175 mg for 2 weeks, Then reduce to 150 mg Trial taper lorazepam : In hopes of improving short-term memory  04/30/2022 phone call: Complaining of withdrawal symptoms reducing lorazepam  from 0.5 mg 5 times a day to 0.5 mg 4 times daily.  05/21/2021 appointment with the following noted: Gotten onto buspar  30 BID and down on lorazepam  to 0.5 mg QID. Gotten down to Seroquel  200 mg HS A little restless every now and then but able to reduce lorazepam  for the last weeks. Is too inactive and lays in bed too much watching TV.   Can' t  tell effect of buspirone  bc of other changes.  Sleep with awakening with 6-7 hours. Fleeting SI without trigger or plan or intent. Depressed more than anxious. Enjoys watching stocks and investing. Plan: Trial taper lorazepam  gradually over time In hopes of improving short-term memory But for now continue lorazepam  0.5 mg QID Start clonidine  0.1 mg tablets one half at night for 3 nights, then 1/2 tablet twice daily for 3 nights, then one half in the morning and 1 tablet at night If clonidine  does not noticeably help anxiety call the office  05/27/2021 phone call initially complaining of homicidal and suicidal thoughts after taking 1 mg of clonidine .  He was told to stop the medication.  But then called back stating he was having homicidal and suicidal thoughts from withdrawal from the medication which was not logical.  He agreed it was not logical and stated on 05/29/2021 that he felt better  06/23/2021 appointment the following noted: No clonidine . Sunday panic attack eating out and then having SI.  Stayed with father to feel safer. Intrusive thoughts of hurting parents or others.  HI only when has SI.  Usually panic without SI.  Then had intrusive thoughts about hurting others.   Usually intrusive thoughts are brief and fleeting.    07/20/21  appt noted; More anxious with less lithium .  More anxious in his body.  Not more depressed or irritable.  Sleep is the same.  More racing thoughts.  Chronic SI probably a litte worse but not unmanageable. Rduced lithium  last week to 300 mg daily. No recent change in lisinopril . Off the clonidine  for a week or so. Doesn't drink much water.  Drinks a lot of diet coke. No marked akathisia or RLS but does shake his leg DT anxiety. Worries about needing mor emedicine. Plan: Increase olanzapine  to 25 mg nightly bc more racing thoughts and anxiety with less lithium  DT severity of sx and difficulty getting off Seroquel  conside rincrease.  07/30/21 TC : CO  racing SI today and wonders about whether increased sx racing thoughts, hyperactivity, SI related to Reduced lithium  from 600 mg daily to 300 mg daily recently DT lithium  level 1.8.  could be related.    Plan: increase olanzapine  30 mg pm Today take lithium  600 mg now.  Tomorrow start lithium  CR 450 mg daily Continue olanzapine  30 mg PM until SI resolves unless akathisia returns.  Lorene Macintosh, MD, DFAPA  08/17/21 appt noted:  No akathisia with olanzapine  30 mg pm (about 3 weeks) and is sleeping better. Increased lithium  to 450 mg daily. Tolerating meds. Still racing  negative thoughts maybe some better.  Asked about it. SI come and go.   Anxiety about the same as depression.  No motivation unless has to do something. Plan: Reduce  fluoxetine  20 mg 1 daily in hopes racing thoughts and anxiety are better.  09/14/2021 appointment with the following noted: No difference noted with reduction fluoxetine  20 mg daily. Most of the time negative thoughts, ex what if I get in a wreck?  Frequent negative thoughts.   Still on lorazepam  0.5 mg TID.   Wonders about trying prior meds that caused akathisia bc no longer gets it with olanzapine  30 mg daily. Sleep is usually ok.  Taking Seroquel  200 mg HS.  Thinks the olanzapine  helps his sleep and doesn't want to stop it. Panic 1-2 times per month.  Then takes extra lorazepam . Plan: DC fluoxetine  20 mg 1 daily  Trial Auvelity off label for depression 1 in the AM for 1 week then 1 in AM and 1 with evening meal  10/15/2021 appointment with the following noted: Occ of getting confused as to day and went to church on a Thursday my mistake.   Had it happen a few months ago too.  Memory is terrible. Thinks he couldn't tolerate more Auvelity. Not a lot of benefit from [place] 1 daily. Still quite a bit of anxiety.   A little drowsy  and sleeps 7-8 hours at night. Word finding problems.  Lost wallet. Plan: DC  Auvelity  For [facility]: memantine  1/2 tablet in the  AM for 1 week, then 1/2 tablet twice daily, then 1/2 tablet in the AM and 1 tablet at night for 1 week then 1 tablet twice daily  10/22/2021 phone call complaining of additional stress asking to start new medication because of anxiety and more suicidal thoughts without intent or plan.  Reports taking more than 4 prescribed Ativan  a day recently.  Asking to increase olanzapine  which is already at 30 mg daily. MD response:We cannot go any higher in the dosage of olanzapine  that he is currently taking.  A lot of these suicidal thoughts are apparently anxiety driven.  Let us  have him resume taking paroxetine  which is one of the more effective medicines for anxiety.  I will send it in and the instructions will be paroxetine  20 mg tablets 1/2 tablet daily for 1 week then 1 tablet daily  11/16/21 appt noted: Multiple phone calls since she was here.  Complaining of suicidal thoughts from memantine  which was stopped. Started paroxetine  and up to 20 mg daily.  Continues olanzapine  30 mg, lithium  450 mg nightly, lorazepam  0.5 mg 4 times daily , And quetiapine  200 mg nightly. Questions about whether memantine  caused the neg SI really bc has them at times. When has SI often anxiety driven.  Will tend to ruminate on past relationship ended.   Edgy and irritable all the time but memantine  seemed to make it worse. Mind is getting better with less SI lately.   No SE with paroxetine .   Aggrivated by forgetfulness.  eXample, talked with someone about going to breakfast last night and then forgot to go this mornin.g. Plan prescribed donepezil  5 mg daily for cognitive complaints specifically forgetfulness  12/31/2021 appointment with the following noted: He had been prescribed donepezil  5 mg for cognitive complaints.  He called back reporting he felt it was causing homicidal thoughts and was instructed to stop it.  He has had these types of thoughts in the past.  He had no desire to act on  them. Discouraged. Ongoing  depression and reduced enjoyment and negative thoughts.  Can enjoy some things. A lot of anxiety chronically.  Trying to limit Ativan  to 5 of 0.5 mg daily.  Sometimes needs 2. Constipation for years.  02/03/22 appt noted: Depression makes LBP worse. Ongoing chronic depression. Tolerated rivastigmine  without triggering SI Chronic anxiety and Ativan  helps some. Occ panic but not usual.  Asks if any other meds coud be used. Plan: But for now continue lorazepam  0.5 mg QID, but use LED for cog reasons Increase rivastigmine  to 3 mg capsule 1 twice daily or 2 of the 1.5 mg capsules twice daily.  Call if you have problems with nausea Start dextromethorphan  1 capsule twice daily . Thi sis off label for depression based on MOA of Auvelity and use of paroxetine  to prolong half life of DM. Presence of paroxetine  may have been reason he didn't tolerate Auvelity brief trial before.  Will proceed slowly.  03/17/2022 appointment noted: Current psych meds: Rivastigmine  3 mg twice daily, quetiapine  200 mg nightly, paroxetine  40 mg daily, olanzapine  30 mg nightly, Namenda  10 mg daily, lorazepam  0.5 mg 4 times daily, lithium  CR 450 mg nightly, dextromethorphan  15 mg twice daily A lot of anxiety since here.  Some days taken more lorazepam  to 6-7 tablets daily.  No major triggers but anxious even about coming here and other normal things.  Cancelled trip to collect rocks with friend and cancelled the trip bc anxious.  Usually if can go he is ok.  Lots of SI ongoing Memory might be a little better. Asks how he responded to Vraylar.  Will check paper chart.  03/24/22 TC:  CO SI worse and restilless with Rivastigmine  9 mg . MD response:  Stop the rivastigmine  since the [place] appears to be having side effects from it.  He was having anxiety at the last appointment 2 and then we increased the dose.  This might be causing the anxiety to be worse. He has chronic suicidal thoughts without intent or plan.  However it appears  the thoughts are more intense with the rivastigmine      05/06/21 appt noted: Stopped rivastigmine  DT anxiety and worsening SI.  Was better the next day. This week had some SI.  Thinking about the prior relationship tends to lead to SI. Will take Ativan  when gets SI and it helps a little at 0.5 mg at a time. Plan: Consider fluvoxamine .  Yes this may help with obsessions on old GF and he failed other optioons. Start fluvoxamine  1/2 of 50 mg tablet and reduce paroxetine  to 1 and 1/2 of 20 mg tablets for 5 days, Then increase fluvoxamine  to 1 tablet daily and reduce paroxetine  to 1 tablet daily for 5 days, Then increase fluvoxamine  to 1-1/2 tablets daily and reduce paroxetine  to 1/2 tablet daily for 5 days, Then increase fluvoxamine  to 2 tablets daily and stop paroxetine    06/07/22 appt noted: Feels like the fluvoxamine  50 BID is too much.  On this a couple of weeks. Worries he might be worse with this with regard to SI.    He thinks it's worse with increase.   New neighbor who has a young boy. He worries that Albert [name] is triggering depression and intrusive SI/HI a couple of days later and lasts a little while. Has had anxiety turning into panic attacks.  This gets associated with depression and SI/HI.  These thoughts are intrusive and he doesn't want to act on them.  Can have HI even towards family  with whom he has no negative emotions.  Little things like a bump up in parking lot sets off his depression and anxiety and doesn't seem to handle things.   No sig akathisia.  In the past it's been continuous.   Awakens every 2-3 hours.  Usually goes back to sleep with meds. Has not had alcohol in 20 years and had some intrusive thoughts without drinking.   Plan: As soon as he feels comfortable increase fluvoxamine  to 50 mg AM and 100 mg PM for intrusive thoughts of HI/SI and obs on GF  07/06/22 appt noted: Increased fluvoxamine  to 150 mg daily and felt more agitated and some SI so reduced to 100 mg 4 days  ago and feels better so far.  Likes the fact it is good for anxiety and obs thoughts.  Wonders if there is a similar med.  Frustrated he can't get relief.  Some improvement in obs on exGF but not resolved. Continue other meds: lithium  CR 450 mg HS, lorazepam  0.5 mg QID, Memantine  10 BID, olanzapine  30 mg HS, quetiapine  200 mg HS Tingling and burning in legs at night but doesn't interfere with sleep. Got insurance to pay for [facility] and lost 13#.   No full panic lately. But some anxiety waves.   Still major problems with memory. Plan: Wean fluvoxamine  50 mg daily for a month then 1/2 daily for 2 weeks then stop it DT  NR.  Watch for more anxiety.  07/28/22 TC: [name], [name]  to Me     07/28/22  4:16 PM Note Mom called reporting patient has been confused, dizzy, and sluggish for the last 3 days. He is now staying with her. She had several questions in regards to this:   Lithium  level - she feels he needs one done, last result in Epic was 08/2021 that I see. She would like order sent to [facility] for tomorrow.   Patient is on Wegovy, has been for about a month, and she wonders if this could cause sx.   Patient has meclizine  and she is questioning if she should give that for his dizziness considering other sx.   She questions if perhaps he got up during the night and took additional medications, though doesn't know if this happened and if so what he took.    On 3/5 visit he was to wean fluvoxamine  to 50 mg qd for a month and then 1/2 tab for 2 weeks and then stop. Mom and pt unsure if he started the wean.    He has F/U 4/4.        Me  to [name], [name]   CC   07/28/22  5:44 PM Note Yes get lithium  level in the AM and don't take any lithium  tonight or in the morning before the blood test.     [name] should not cause these sx except if he is losing wt quickly or is dehydrated it can cause lithium  toxicity and include these sx.  He needs to drink lots of water.   He should go  ahead and wean the fluvoxamine  as instructed in the last appt.   If the sx get worse, go to the ER.    Me   CC   07/29/22  6:49 PM Note RTC   Sx not as severe as when he had to go to hospital in the past.  [name] got Lithium  level today.   Speech is not good .  Confused at times,  but better today.  A little tremor .  A little balance problems.  No falls.  No diarrhea Took lihtium 450 last night. Mo says he got up in the middle of the night and moved things around last night and he doesn't rmember.   No change in night time meds.   Gradually reducing fluvoxamine .   No extra lorazepam .     Spoke with mother who verifies the same.   No med changes but push fluids.   Lorene Macintosh, MD, DFAPA      08/05/22 appt : with parents [name] 08/02/22 with acute kidney injury and somewhat elevated lithium  level with some confusion.  Spoke with psych NP at [name] and lithium  was decreased to 450 mg HS. No pending appt known with medical doctors about kidney function. Don't feel good and having HI and SI and feels he needs lithium  for these issues.  Doesn't feel he should have been discharged.   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 No added meds at night but has been confused at night and getting up multiple times and doing things he doesn't remember.  This concerns parents.  Parents been up at night with him and he doesn't make sense.  Still groggy in the am per mo for an hour.   Plan: Plan: Get lithium  level and kidney test ASAP Stop fluvoxamine  Reduce lithium  300 mg capsule 1 at night Monday night: reduce to olanzapine  to 20 mg at night (stop the 10 mg tablet) and start clozapine  prescription. Monday 4/15 : increase clozapine  to 100 mg at night and reduce quetiapine  to 1/2 of 200 mg tablet and continue olanzapine  20 mg each evening.  08/01/22 & 08/11/22 ED for HI and SI  08/12/22 TC mother:  Mother, [name] called. Per note made by Harlene Franchot PIETY. He was referred to [facility].Reporting that he did go up  there last night. This morning they called her and told her to come pick him up. They told her he wasn't having homicidal or suicidal thoughts and it would be four to five days of him waiting to go to a facility in [place]. They recommended him for PHP to start 4/16. She picked him up today. He is worried about these thoughts coming back up. Also, [facility] came out yesterday to assess him and offered counseling from [facility]. He wants to do counseling with them.  Any input on care he can receive?  He is so discouraged and wants to be safe.  (She notes that the [facility] gave him- Zyprexa  10mg  at 1:07am and again at 1:25am per the med list- based on his previous med list) He was supposed to be off of Zyprexa10mg . Has been on  Clozapine  since Monday. He did go ahead and take it before he left ( 2pills) so he hasn't missed a dose and did go get labwork done. Is wanting to continue the Clozapine , please check the labwork to be able to fill the 100mg  dose.    MD resp:  Continue all meds as RX except go ahead and increase clozapine  to 100 mg tablets, 1 each night as soon as he can get the 100 mg tablets from pharmacy.  RX sent.  Trying to speed up his chance of response by getting it to a hgiher dose.  Clozapine  is very effective for HI/SI.  If he gets worsening HI/SI in the interim it will not be caused by meds but just the natural waxing and waining of his sx.  Once clozapine  dose gets  high enough it should help but avg patient needs 300 mg so we have to just be patient.      08/20/22 appt:  Up to 100 mg clozapine  HS SE constipation and using Colace, Miralax .  Been to [facility] twice  since here.   Ongoing continuous HI/SI.   All I want to do is go to sleep to escape.  Dep causing the thoughts.   Taking Ativan  0.5 mg BID-QID.  He thinks mother not giving him enough of it.  He thinks anxiety triggers HI/SI.  He fears HI and hopes he's strong enough to reisist.  Not angry or wanting to hurt anyone in reality but has the intrusive  thoughts. Conc and memory still not good.   Sleep not much different.   Staying with parents currently. Dep gotten worse M worrying he's dependent on lorazepam .   Confusion at night is no longer happening.  Not wandering at night.  Plan: For severe constipation start Linzess  145 mg AM Get lithium  level and kidney test ASAP Stopped fluvoxamine  and confusion at night is gone  lithium  300 mg capsule 1 at night  olanzapine  to 20 mg at night  increase clozapine  to 150 mg at night for 3 nights then 200 mg HS   olanzapine  20 mg each evening. Continue lorazepam  0.5 mg QID, needs this and informed mother  09/07/22 appt noted: Psych meds: clozapine  250 MG HS, lithium  300 mg HS, lorazepam  0.5 mg BID-QID, olanzapine  20 .  Off Seroquel , linzess  290 mg daily, Miralax , Colace. Sleep 7-8 hours without change from before clozapine .   Mood is still very depressed and still gets anxiety. Still problems with constipation.   Dep is so much it is causing intrusive HI/SI.   No more bizarre behaviors at night.   Plan: Plan: Get lithium  level and kidney test ASAP  lithium  300 mg capsule 1 at night  olanzapine  to 20 mg at night  increase clozapine  to  300 mg HS as soon as tolerated and then 400 mg HS.  olanzapine  20 mg each evening. Continue lorazepam  0.5 mg QID, needs this and informed mother  10/07/22 appt noted: alone and with parents Rough month.  Altered voice for a couple of weeks or so..  Mind not good.  Feel out of sorts.  Jittery.  Constipation.  No appetite.  On Wegovy.  Saw GI doc.   May need to reduce Wegovy bc poor appetite and constipation.  Lost 25 #. Current psych med: clozapine  400 mg HS about a week or 2, olanzapine  20 mg HS, lithium  300 mg HS, lorazepam  0.5 mg QID. Reduced intrusive thoughts.  2 nights since here night eating.   Sleep 6 hours.  Limited napping during the day.   Anhedonia.  Lower motivation.  Tremor worse. M says he sleeps pretty well but awakens  BP is under  control. Plan Reduce lithium  to 150 mg capsule 1 at night DT level higher Reduce  olanzapine  to 10 mg at night  Continue clozapine  400 mg HS bc helping SI and HI and  Continue lorazepam  0.5 mg QID, needs this and informed mother  11/08/22 appt noted:  alone and with parents. Ongoing calls between visits Doesn't think clozapine  helping him sleep as well as olanzapine  for sleep or any other benefit. While at restaurant had intrusive HI/SI. Staying with parents seems to provoke intrusive HI.  Is grateful to them.  Sat woke up with them.  Sometimes goes away and sometimes not.  No know trigger and  no intent. Saw PCP.  Off Wegovy.  Started metformin . No effect noted from clozapine .   M limits his lorazepam .  PCP referring him to neuro to FU abnormal head scan.  He wonders if his speech and memory problems are related.   Plan: Cont clozapine  400 mg HS Check clozapine  level and then direct dosing.  11/16/22 TC from pt to disc level:  MD resp:  Clozapine  level ok but may benefit with increase to 500 mg nightly.  Tell him to increase with current supply.     12/06/22 TC from pt:  complaining of worsening intrusive ego-dystonic HI/SI without intent, desire or plan.   MD resp ok to increase clozapine  to 600 mg HS.  12/09/22 appt noted: Complaining of ongoing racing thoughts and worsening HI/SI without intent or desire or plan.  Racing thoughts chronic and other thoughts wax and wane.  Usually triggered if starts thinking of something negative Meds: clozapine  600 mg HS, lithium  150, lorazepam  0.5 mg QID, olanzapine  10 HS Sleep with EFA.   Will have trouble going to sleep sometimes with thoughts. Doesn't think clozapine  makes him sleepy.  Not really drowsy daytime. No dx OSA and no known apnea.  SE a little dizzy.  Constipation managed with BM every 2-3 days. Better than before.  PCP RX metformin  and it helped. Plan: Plan for intrusive HI/SI wean olanzapine  and trial of risperidone .  Reduce olanzapine   to 5 mg HS for 1 week then stop it.  Addressed his fears of not sleeping off olanzapine . Then if sleeping start risperidone .  01/11/23 appt noted:  also seen with parents Psych meds: clozapine  600, lorazepam  0.5 mg QID, No olanzpaine ,  lithium  150 mg daily, no risperidone  yet. Lost 45# on Wegovy down to 150#.   Still having intrusive HI/SI as noted before. Sleep no worse.  Takes a couple of hours after clozapine  to sleep.  EFA 2-3 times nocturia.   Still racing thoughts.  Ongoing px with conc, memory,  planning Scored 73 on 100 pt test for cognition.   Some trouble with articulation and speech at times.   If could get rid of racing and intrusive thoughts I'd be 100% better. Neuro says no stroke history and low B12 and workup ongoing at [facility], [name]. Memory is still terrible.   Plan: Start risperidone  2 mg tablet, 1 at night for racing thoughts and intrusive homicidal and suicidal thoughts. Call in 3 weeks if not better and we will increase the dose  02/09/23 appt: seen also with parents , 60 min  Lost to 152#. Meds: as above, risperidone  incr to 4.5 mg HS for intrusive HI/SI SE drooling off an on.  Tremor mainly worse with a little more risperidone  HI/SI intrusive thoughts wax and wane.  Worse when lays down. Takes 2 hours to fall asleep.  Needs to get OOB when awakens.  6 hours sleep. No falls.   Would rather try to increase risperidone  than other options.    04/12/23 appt virtual:  seen alone CC:  Multiple phone calls about ongoing intrusive, obsessive, ego dystonic HI/SI thoughts without plan or intent. Didn't come to the office bc of these thoughts.  I know I don't want to do it.  Can last four hours.  Sees drug advertisements that talk about SI and that will trigger obsessions about it. Meds: clozapine  600 mg HS,  lithium  150 mg HS, lorazepam  0.5 mg TID, risperidone  increased to 8 mg and now back to 4mg  HS.  Neuro sched MRI tomorrow. Enough sleep 5-6 hours at night.  Not  napping.   Sometimes gets confused with med but not ususally.   No recent mania.   Depressed and anxious. Plan: AVS: Reduce risperidone  to 1/2 of 4 mg tablet in evening for 1 week then stop it. Reduce clozapine  to 5 tablets nightly Start fluvoxamine  100 mg tablet 1/2 tablet nightly for 1 week then 1 tablet nightly for obsessive, intrusive thoughts  05/25/23 appt in person:  not with parents, met with [name] with 2 sz and then had psych [name] since here.  Another EEG 06/03/23.  Neuro DR.  Loreli.  RX Keppra .  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 Psych med: clozapine  reduced to 300 HS, stopped ? Risperidone  he's not sure, clonazepam  0.5 mg BID, lithium  150 HS, trazodone  50 prn rare.  Keppra  500 BID. Trazodone  good sleep med. SE drooling again.   Not sure any mental health effect of Keppra .   Denies any street drugs despite positive UDS for ecstasy. Overall still having intrusive HI/SI without intent or plan chronically but maybe is a bit better than before [name] stay. Sleep is better.  Lost 40# on Wegovy.  Plan no change  06/22/23 appt noted: EEG shows some epileptiform activity with FU pending with another 4 hour EEG pending. Meds unchanged Overall mental health about the same or a tad bit better.  Seems he can distract himself from intrusive thoughts and they are less common than they were.   CC mouth watering from med.  Choking middle of the night.  Atropine  helps some but has to be repeated and pain in the ass.   Sleep 6 hours.  Sometimes taking trazodone  50.  Sleep is not that bad.  Psych med: clozapine  reduced to 300 HS, , clonazepam  0.5 mg BID, lithium  150 HS, trazodone  50 prn rare.  Keppra  500 BID. Plan no changes  07/20/23 appt noted:  alone and with parents. Psych med: clozapine  reduced to 300 HS, , clonazepam  0.5 mg BID, lithium  150 HS, trazodone  50 prn rare.  Keppra  500 BID. B12 oral.  EEG sched for 08/2023; appt 08/25/23 Maybe a little better  but still same intrusive thoughts.  They cause him to have somatic anxiety like palpitations.  You know I don't want to do any of those thoughts.    Out of the blue.   SE still drooling.  Using drops some.  But not often. [name] therapist in [place].  Likes her.  She's good at teaching . Awakening 2-3 times night nocturia usually.  Back to sleep ok Parents think he is better with more interest. Seems less disturbed .  More outgoing and social and mother agrees. Switched lorazepam  to clonazepam  0.5 mg BID seems to be working better and more calming and lasts longer.  08/18/23 appt noted: Med: clozapine  reduced to 300 HS, , clonazepam  0.5 mg BID, lithium  150 HS, trazodone  50 prn rare.  Keppra  500 BID. B12 oral.  Appt next week with Dr. Loreli to review EEG. No med changes.  Still on above. SE drooling. No effect from atropine  2 drops . Mood not worse but not a whole lot better Anxiety still better with clonazepam  vs lorazepam . Sleep pretty good 5-6 hours and would like to sleep more.  Nocturia.   Not sedated from clozapine .  Rare naps. No other medical px except about EEG.   Low motivation bc doesn't feel like it.  So disgusted with lack of  response with meds.   Does limited chores.  Has cleaning lady.  At parent's house off and on. Go through the motions.  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  Just lay around.   Plan: No med changes.  Will wait to make med changes until after EEG work up completed.    09/19/23 appt noted:  disc plan with parents.  Med: clozapine  reduced to 300 HS, , clonazepam  0.5 mg BID, lithium  150 HS, trazodone  50 prn rare.  Keppra  500 BID. B12 oral.  Neuro  Dr Maree EEG spikes not likely to cause psych sx Frustrated with chronic psych sx dep.  Gets angry over the fact that no meds seem to help. Goes out to eat and enjoys food but doesn't feel like doing much.   No sig change in psych sx.  Chronic anxiety and dep.  Anxiety is better with clonazepam  than other meds.  Will  get triggered in loud places with agitation.  Low tolerance of stimulation. OCD therapist [name] , [place], said she couldn't help his chronic intrusive HI/SI.   Still has spells of intrusive HI/SI Plan no changes  10/24/23 appt noted:  parents here today Med: clozapine  reduced to 300 HS, , clonazepam  0.5 mg BID, lithium  150 HS, trazodone  50 prn occ.  Keppra  500 BID. B12 oral.  Atropine  drops at night.   No SZ in over 6 mos.  Not driving again yet. Trazodone  works when needed.  Likes it .   Some panic but less severe and frequent.  Not as bad.   SE no much except drooling ongoing.  Uses atropine  drops at night Don't like things I used to like but not really new.   Clozapine  good for sleep.  Still some intrusive thoughts but not as bad as they were. F 53 yr at [employer].  Generally dep is worse than anxiety.  Anhedonia and intrusive thoughts.  Plan: no changes  12/14/23 appt noted:  Med: clozapine   300 HS, , clonazepam  0.5 mg BID, lithium  150 HS, trazodone  50 prn occ.  Keppra  500 BID. B12 oral.  Atropine  drops at night.   Intrusive thoughts and SI gone down some.   Atropine  drops not working well enough so not using it.   Doesn't stop drooling at night.   SE drooling and constipation.  Seeing GI Chronic dep and anxiety .  Some ongoing anhedonia.  Would like more relief.  No SZ and no stroke like SX Trazodone  works when needed.  Some dep cycling without pattern or pcpt. M helps with meds.  BC is still forgetful  Plan: Retry Vraylar 1.5 mg every other day  12/23/23 TC Pt reports he took Vraylar 4 days, W-F-M-W and he started having SI on Tuesday.  He reports sx were less yesterday and a little better today, has not taken it today. He said you told him he shouldn't have SI with it. No other sx reported. The suicidal thoughts were no worse than what he has reported previously, but said he is pretty good at gaging his sx and was afraid to continue the medication. Reports  he feels so bad all the time and he is anxious to feel better. Has FU 9/17.  Took it MW for 2 weeks without akathisia, but then intrusive worsening of SI    MD resp: DC Vraylar  01/18/24 appt noted:  Med: clozapine   300 HS, , clonazepam  0.5 mg BID, lithium  150 HS, trazodone  50 prn occ.  Keppra  500 BID.  B12 oral.  Atropine  drops at night.   Foul mood with irritability.  SI better off Vraylar.  Down bc tried so many things that haven't worked.   Gets out and does things but doesn't really want to do it.  Not much to do during the week and just lies around.  Does not do many chores.   Anhedonia and ex not doing Ham radio as in past.   FU neurology in Oct and will disc memory concerns. Still feels need for mother to help with meds.    Decided more even anxiety benefit with clonazepam  vs lorazepam .  Anxiety less on clonazepam .  Psych med hx extensive including ECT and  Risperidone  Maxed out risperidone  8 mg without benefit for intrusive thoughts  Zyprexa  30 Latuda 80 which caused akathisia,  Vraylar 1.5 every other day for 2 weeks; worsening intrusive thoughts,  Rexulti, aripiprazole 20 mg with akathisia,  Seroquel  1000 mg,  InVega, Geodon , Saphris with side effects, symbyax, Fanapt NR .  Caplyta  SE and markedly worse. Clozapine  started end of March 2024; highest dose 600 highest dose lithium  1200 SE,   lamotrigine 300 mg, Depakote 2000 mg, Tegretol, Trileptal and several of these in combinations, gabapentin , Keppra   N-acetylcysteine, Nuedexta,     Belsomra with no response,  Lunesta no response, trazodone  200 mg,  Xanax , clonazepam , lorazepam  less sedation. Buspirone  NR Clonidine  SE with 2 trials  Viibryd 40 mg for 3 months with diarrhea,  protriptyline with side effects,  Trintellix 20 mg,  Parnate 50 mg with no response,   Emsam 12 mg for 2 months,   imipramine,  venlafaxine,   bupropion side effects,  Lexapro 20 mg, sertraline, paroxetine , Deplin, fluoxetine  80,   fluvoxamine  150 agitated; retrial ? Sz related.   Auvelity NR at one daily.  SE BID  methylphenidate 60 mg,  Vyvanse, Concerta, strattera, , modafinil,  Memantine  worsening SI? Donepezil , complained of HI, Exelon  3 BID rivastigmine  DT worsening SI  pramipexole,  amantadine ,  Patient prone to akathisia.  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 Neuro [name], [facility] in progress.   EEG on 06/03/23 bc recent sz 08/25/23 appt Dr. 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 No breakthrough seizures since last visit. Currently on levetiracetam  500 mg twice daily. Requests a once-daily regimen. Epileptiform discharges necessitate continued anti-seizure medication to mitigate recurrent seizure risk. Discharges are insufficient to cause psychiatric issues. Levetiracetam  XR offers once-daily dosing convenience without altering the total daily dose. - Switch to levetiracetam  1000 mg XR once daily, to be taken at nighttime.     Review of Systems:  Review of Systems  Constitutional:  Positive for appetite change and fatigue.  Cardiovascular:  Negative for chest pain and palpitations.  Gastrointestinal:  Positive for constipation.  Musculoskeletal:  Positive for back pain.  Neurological:  Positive for dizziness and tremors. Negative for weakness.       Fidgety  Psychiatric/Behavioral:  Positive for decreased concentration, dysphoric mood and suicidal ideas. Negative for agitation, behavioral problems, hallucinations, self-injury and sleep disturbance. The patient is nervous/anxious. The patient is not hyperactive.     Medications: I have reviewed the patient's current medications.  Current Outpatient Medications  Medication Sig Dispense Refill   acetaminophen  (TYLENOL ) 500 MG tablet Take 1,000 mg by mouth every 6 (six) hours as needed for moderate pain.     atropine  1 %  ophthalmic solution PLACE 2 DROPS UNDER THE TONGUE THREE TIMES PER DAY AS NEEDED AS DIRECTED 5 mL 2   cholecalciferol  (VITAMIN D3) 25 MCG (1000 UNIT) tablet Take 1,000 Units by mouth daily.     clonazePAM  (KLONOPIN ) 0.5 MG tablet Take 1 tablet (0.5 mg total) by mouth 2 (two) times daily. 60 tablet 3   cloZAPine  (CLOZARIL ) 100 MG tablet Take 3 tablets (300 mg total) by mouth at bedtime. 90 tablet 3   Cyanocobalamin  (VITAMIN [name]-12 IJ) Inject as directed every 30 (thirty) days.     esomeprazole (NEXIUM) 40 MG capsule Take 40 mg by mouth daily.     fenofibrate  (TRICOR ) 145 MG tablet Take 145 mg by mouth daily.     levETIRAcetam  (KEPPRA ) 500 MG tablet Take 1 tablet (500 mg total) by mouth 2 (two) times daily. 60 tablet 0   levothyroxine  (SYNTHROID , LEVOTHROID) 75 MCG tablet Take 75 mcg by mouth daily before breakfast.     LINZESS  290 MCG CAPS capsule Take 290 mcg by mouth daily.     lithium  carbonate 150 MG capsule Take 1 capsule (150 mg total) by mouth at bedtime. 90 capsule 1   metFORMIN  (GLUCOPHAGE -XR) 500 MG 24 hr tablet Take 500 mg by mouth 2 (two) times daily with a meal.     sildenafil  (VIAGRA ) 100 MG tablet Take 1 tablet (100 mg total) by mouth daily as needed for erectile dysfunction. 30 tablet 6   tamsulosin  (FLOMAX ) 0.4 MG CAPS capsule Take 1 capsule (0.4 mg total) by mouth daily. 90 capsule 3   traZODone  (DESYREL ) 50 MG tablet Take 1 tablet (50 mg total) by mouth at bedtime as needed for sleep. 90 tablet 0   No current facility-administered medications for this visit.   Medication Side Effects: Other: mild sleepiness.  Occ twitches.\, tremor  Allergies:  Allergies  Allergen Reactions   Meloxicam Other (See Comments)    Dizziness   Zyprexa  [Olanzapine ] Other (See Comments)    Akathesia   Nsaids Other (See Comments)    Hallucinations   Ibuprofen Other (See Comments)    Can not take because taking lithium    Prednisone Other (See Comments)    Can't sleep    Wellbutrin [Bupropion]  Other (See Comments)    Suicidal thoughts    Past Medical History:  Diagnosis Date   ADHD (attention deficit hyperactivity disorder)    Anemia    Anxiety    Bipolar disorder (HCC)    BPH (benign prostatic hyperplasia)    Chronic kidney disease, stage 3b (HCC)    Constipation    DDD (degenerative disc disease), cervical    Depression    Deviated septum    ED (erectile dysfunction)    GERD (gastroesophageal reflux disease)    Graves disease    History of hiatal hernia    Hypertension    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    Hypothyroidism    Pneumonia    PONV (postoperative nausea and vomiting)     Family History  Problem Relation Age of Onset   Prostate cancer Neg Hx    Bladder Cancer Neg Hx    Kidney cancer Neg Hx     Social History   Socioeconomic History   Marital status: Single    Spouse name: Not on file   Number of children: Not on file   Years of education: Not on file   Highest education level: Not on file  Occupational History   Not on file  Tobacco Use   Smoking status: Former    Current packs/day: 0.00    Types: Cigarettes    Quit date: 2010    Years since quitting: 15.7    Passive exposure: Past   Smokeless tobacco: Former    Types: Associate Professor status: Never Used  Substance and Sexual Activity   Alcohol use: Not Currently   Drug use: Never   Sexual activity: Yes  Other Topics Concern   Not on file  Social History Narrative   Not on file   Social Drivers of Health   Financial Resource Strain: Low Risk  (11/08/2023)   Received from [facility]   Overall Financial Resource Strain (CARDIA)    Difficulty of Paying Living Expenses: Not very hard  Food Insecurity: No Food Insecurity (11/08/2023)   Received from [facility]   Hunger Vital Sign    Within the past 12 months, you worried that your food would run out before you got the money to buy more.: Never true     Within the past 12 months, the food you bought just didn't last and you didn't have money to get more.: Never true  Transportation Needs: No Transportation Needs (11/08/2023)   Received from [facility] - Transportation    In the past 12 months, has lack of transportation kept you from medical appointments or from getting medications?: No    Lack of Transportation (Non-Medical): No  Physical Activity: Not on file  Stress: Stress Concern Present (08/12/2020)   Received from [facility] - Occupational Stress Questionnaire    Feeling of Stress : Very much  Social Connections: Unknown (09/01/2021)   Received from [facility]   Social Network    Social Network: Not on file  Intimate Partner Violence: Not At Risk (04/17/2023)   Humiliation, Afraid, Rape, and Kick questionnaire    Fear of Current or Ex-Partner: No    Emotionally Abused: No    Physically Abused: No    Sexually Abused: No    Past Medical History, Surgical history, Social history, and Family history were reviewed and updated as appropriate.   Please see review of systems for further details on the patient's review from today.   Objective:   Physical Exam:  There were no vitals taken for this visit.  Physical Exam Constitutional:      General: He is not in acute distress.    Appearance: He is well-developed.  Musculoskeletal:        General: No deformity.  Neurological:     Mental Status: He is alert and oriented to person, place, and time.     Cranial Nerves: No dysarthria.     Motor: Tremor present.     Coordination: Coordination normal.     Comments: mild tremor  Psychiatric:        Attention and Perception: Attention and perception normal. He does not perceive auditory or visual hallucinations.        Mood and Affect: Mood is anxious and depressed. Affect is not labile, blunt, angry or tearful.        Speech: Speech normal. Speech is not  slurred.        Behavior: Behavior normal. Behavior is not agitated or slowed. Behavior is cooperative.        Thought Content: Thought content is not delusional. Thought content does not include homicidal or suicidal ideation. Thought  content does not include suicidal plan.        Cognition and Memory: Cognition normal. He exhibits impaired recent memory.        Judgment: Judgment normal.     Comments: Insight fair Chronically talkative .directable. But chronically mildly pressured but less so and calmer than usual. He is chronically anxious  .  No manic signs noted. Anhedonia  Intrusive obsessions episodic HI/SI are better since Dec but not gone and do cycle Alert and oriented.      Lab Review:     Component Value Date/Time   NA 144 04/17/2023 0314   NA 135 10/20/2022 1123   NA 142 03/20/2014 0514   K 4.4 04/17/2023 0314   K 4.1 03/20/2014 0514   CL 109 04/17/2023 0314   CL 112 (H) 03/20/2014 0514   CO2 28 04/17/2023 0314   CO2 26 03/20/2014 0514   GLUCOSE 92 04/17/2023 0314   GLUCOSE 99 03/20/2014 0514   BUN 16 04/17/2023 0314   BUN 22 10/20/2022 1123   BUN 7 03/20/2014 0514   CREATININE 1.43 (H) 04/17/2023 0314   CREATININE 0.78 03/20/2014 0514   CALCIUM  8.7 (L) 04/17/2023 0314   CALCIUM  9.5 03/20/2014 0514   PROT 6.5 04/14/2023 1047   PROT 7.3 03/18/2014 1459   ALBUMIN 3.8 04/14/2023 1047   ALBUMIN 3.2 (L) 03/18/2014 1459   AST 21 04/14/2023 1047   AST 16 03/18/2014 1459   ALT 10 04/14/2023 1047   ALT 16 03/18/2014 1459   ALKPHOS 39 04/14/2023 1047   ALKPHOS 105 03/18/2014 1459   BILITOT 0.3 04/14/2023 1047   BILITOT 0.3 03/18/2014 1459   GFRNONAA 56 (L) 04/17/2023 0314   GFRNONAA >60 03/20/2014 0514   GFRAA 54 (L) 04/18/2019 1612   GFRAA >60 03/20/2014 0514       Component Value Date/Time   WBC 4.2 01/11/2024 1031   WBC 6.5 04/21/2023 0946   RBC 4.08 (L) 01/11/2024 1031   RBC 3.92 (L) 04/21/2023 0946   HGB 12.6 (L) 01/11/2024 1031   HCT 38.3  01/11/2024 1031   PLT 396 01/11/2024 1031   MCV 94 01/11/2024 1031   MCV 89 03/20/2014 0514   MCH 30.9 01/11/2024 1031   MCH 30.9 04/21/2023 0946   MCHC 32.9 01/11/2024 1031   MCHC 32.7 04/21/2023 0946   RDW 12.1 01/11/2024 1031   RDW 12.5 03/20/2014 0514   LYMPHSABS 2.0 01/11/2024 1031   LYMPHSABS 3.2 03/20/2014 0514   MONOABS 0.4 04/21/2023 0946   MONOABS 1.0 03/20/2014 0514   EOSABS 0.4 01/11/2024 1031   EOSABS 0.4 03/20/2014 0514   BASOSABS 0.1 01/11/2024 1031   BASOSABS 0.1 03/20/2014 0514    Lithium  Lvl  Date Value Ref Range Status  10/13/2023 0.2 (L) 0.5 - 1.2 mmol/L Final    Comment:    A concentration of 0.5-0.8 mmol/L is advised for long-term use; concentrations of up to 1.2 mmol/L may be necessary during acute treatment.                                  Detection Limit = 0.1                           <0.1 indicates None Detected    08/12/21 lithium  level 0.9 on 450 mg.  lithium  level Sept 0.8.   lithium  level July 27, 2018 was normal at  1.0.   Lithium  level [facility] October 03, 2018 = 1.2. Said he got lithium  level at [facility] as requested.  Labs not in Epic.  Recent lipids ok except higher TG than usual.  Normal A1C.  11/10/22 Checked clozapine  level on 400 mg HS= cloz 733+norclozapine 294 = total 1027  06/07/23  Vitamin B12 >300 pg/mL 848  On oral supplement  Assessment: Plan:    Dougles was seen today for follow-up, depression, anxiety and manic behavior.  Diagnoses and all orders for this visit:  Severe bipolar I disorder, current or most recent episode depressed (HCC)  GAD (generalized anxiety disorder)  Panic disorder with agoraphobia  Insomnia due to mental condition  Other obsessive-compulsive disorders  Mild cognitive impairment  Drug-induced constipation  Low serum vitamin B12  Long term current use of clozapine   Sialorrhea   60 min appt with pt  needs a lot of time   .   Seen alone and then with parents for additional info.  Pt is  fair historian.  Chronic TR bipolar mixed and chronic anxiety.  He usually has mixed bipolar symptoms which we have not been able to completely eliminate.  See long list of meds tried.   He is  chronically unstable .  Mood is relatively stable except for the severe intrusive, ego-dystonic persistent obsessive HI/SI without intent or plan.  No better no worse.  Residual anhedonia. Hx chronic intrusive ego dystonic thoughts HI/SI.  More like obsessions than true HI/SI.   No recent mania.  Chronic dep and anxiety.  But is a little better since being on Keppra .   Psych [name] 08/02/22 with SI and acute kidney injury and elevated lithium . Processed this and disc goal of trying to stop lithium  but risk of worsening SI.  He has chronic SI and there's risk of this worsening off lithium .  Lithium  and clozapine  are the most effective meds for SI.    [name] for SZ episodes.  Fluvox stopped and clozapine  reduced to 300 mg HS.   Was RX risperidone  but he stopped that after DC.  It was ineffective in at least 2 trials.  We discussed the short-term risks associated with benzodiazepines including sedation and increased fall risk among others.  Discussed long-term side effect risk including dependence, potential withdrawal symptoms, and the potential eventual dose-related risk of dementia.  But recent studies from 2020 dispute this association between benzodiazepines and dementia risk. Newer studies in 2020 do not support an association with dementia.  Unfortunately due to the severity of set his symptoms polypharmacy is a necessity.    Lithium  being used bc chronic SI and death thoughts.  Counseled patient regarding potential benefits, risks, and side effects of lithium  to include potential risk of lithium  affecting thyroid  and renal function.  Discussed need for periodic lab monitoring to determine drug level and to assess for potential adverse effects.  Counseled patient regarding signs and symptoms of lithium  toxicity  and advised that they notify office immediately or seek urgent medical attention if experiencing these signs and symptoms.  Patient advised to contact office with any questions or concerns.  Reduced lithium  to 150 mg daily Dt hx toxicity Checked lithium  level and BMP and level 1.1 on 300 mg Daily so reduced again to 150 mg daily and level 0.4 05/14/24 Call if death thoughts worsen or worsening SI.  Disc clozapine  and it's level..  Disc data showing less SI with it but he's had no changes.    Disc risk in detail including  low WBC with complication, myocarditis.  Extensive discussion of CBC monitoring.  Disc going higher despite level and checking ECG bc cardiac risk at higher doses.   Disc this again bc taking Wegovy might help him to tolerate it better.   clozapine  600 mg HS was Started 12/07/22. Started clozapine  08/05/22. 11/10/22 Checked clozapine  level on 400 mg HS= cloz 733+norclozapine 294 = total 1027 Reduced to 300 mg HS at the hospital. CBC every 12 weeks, now that on it over a year.  And DT recent FDA changes in recommendation.  Use the atropine  drops prn 2-4 on each side.  It's managed.  Did he take duloxetine?  Might help depression more.  Consider retry pramipexole off label  Consider Qelbree.  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   Consider trial Fanapt.  Yes, disc SE in detail.  Switched lorazepam  to clonazepam  0.5 mg BID  working better and more calming and lasts longer. M administers  We discussed the short-term risks associated with benzodiazepines including sedation and increased fall risk among others.  Discussed long-term side effect risk including dependence, potential withdrawal symptoms, and the potential eventual dose-related risk of dementia.  But recent studies from 2020 dispute this association between benzodiazepines and dementia risk. Newer studies in 2020 do not support an association with dementia.  Discussed safety plan at length with patient.  Advised  patient to contact office with any worsening signs and symptoms.  Instructed patient to go to the [facility] emergency room for evaluation if experiencing any acute safety concerns, to include suicidal intent.  He commits to safety. His HI/SI that he complains about are intrusive ego-dystonic obsessions and not true HI SI in that there's no desire or intent ever.  Off GLP-1.  Has had weight gain from meds as a contributor. Lost 45# with Wegovy and 150#.    Needs to keep up activity as much as possible for depression.  For severe constipation seeing GI  Historically he has not done well with serotonergic medicines which would typically be used for obsessive thoughts.  But we are running out of options.   Repeat ECT not good option bc SZ for TR sx.   Disc memory concerns and mother's memory concerns bc had it before.  Disc counseling.  [email or web address] for therapist.  Most effective therapist will need to be expert in CBT.  He has one now.  Fanapt 1 mg nightly for 3 nights , then 2 mg nightly for 3 nights , then 3 mg nightly for 3 nights, then 4 mg nightly Disc SE  FU 4 weeks   Lorene Macintosh, MD, DFAPA  Future Appointments  Date Time Provider Department Center  02/15/2024  1:00 PM Cottle, Lorene KANDICE Raddle., MD [facility] None  03/21/2024  1:30 PM Cottle, Lorene KANDICE Raddle., MD [facility] None  04/18/2024  1:00 PM Cottle, Lorene KANDICE Raddle., MD [facility] None  12/26/2024  2:30 PM Francisca Redell BROCKS, MD [facility] None    No orders of the defined types were placed in this encounter.      -------------------------------

## 2024-01-25 ENCOUNTER — Telehealth: Payer: Self-pay

## 2024-01-25 ENCOUNTER — Telehealth: Payer: Self-pay | Admitting: Psychiatry

## 2024-01-25 ENCOUNTER — Other Ambulatory Visit: Payer: Self-pay | Admitting: Psychiatry

## 2024-01-25 MED ORDER — FANAPT 4 MG PO TABS: 4.0000 mg | ORAL_TABLET | Freq: Two times a day (BID) | ORAL | 0 refills | Status: DC

## 2024-01-25 NOTE — Telephone Encounter (Signed)
 Pt lvm requesting Bridget to rtc. He has issues about apt last week. Pt [phone]

## 2024-01-25 NOTE — Telephone Encounter (Signed)
 Patient notified that Fanapt  4 mg had been sent to [facility]. Told him that Rx was sent for BID but for now he should only take 1 at bedtime. He repeated back to me.

## 2024-01-25 NOTE — Telephone Encounter (Addendum)
 We are aware that PA is needed. I let Traci know and she will initiate PA today.   PA has been approved and patient was notified. He said the pharmacy didn't have the medication in stock and would have to order it.

## 2024-01-25 NOTE — Telephone Encounter (Signed)
 Pt seen 9/17: Fanapt  1 mg nightly for 3 nights ,  then 2 mg nightly for 3 nights ,  then 3 mg nightly for 3 nights,  then 4 mg nightly  He reports taking 3 mg currently, has had no SE. He wants Rx sent to [facility] for 4 mg so that he doesn't run out.

## 2024-01-25 NOTE — Telephone Encounter (Signed)
 PA Fanapt  4 mg #60/30 day approved with [employer] through 05/02/24 [id number]

## 2024-01-25 NOTE — Telephone Encounter (Signed)
 Pt had asked that Rx for clozapine  be sent to [facility] at 9/17 visit. He called back on 9/17 to report that [facility] said they couldn't fill it and he wanted Rx sent to [facility], where we had been sending RF. Pt was also told that he only needed to get CBC every 3 mo. He was concerned about getting clozapine . Told him that the pharmacy no longer had to keep up with labs, that Dr. Geoffry was the only one that needed labs. A 90-day supply was sent to [facility] on 9/17. This new process was reviewed with patient several times.

## 2024-01-25 NOTE — Telephone Encounter (Signed)
 Pt states pharm just called and told him he needs a prior auth for meds

## 2024-01-25 NOTE — Telephone Encounter (Signed)
 Sent RX Fanapt  4 mg Bid bc it is often taken BID but let him know to stay on just HS for now.  We may increase it later.

## 2024-02-15 ENCOUNTER — Encounter: Payer: Self-pay | Admitting: Psychiatry

## 2024-02-15 ENCOUNTER — Ambulatory Visit (INDEPENDENT_AMBULATORY_CARE_PROVIDER_SITE_OTHER): Admitting: Psychiatry

## 2024-02-15 VITALS — BP 112/72 | HR 85

## 2024-02-15 DIAGNOSIS — F411 Generalized anxiety disorder: Secondary | ICD-10-CM

## 2024-02-15 DIAGNOSIS — F5105 Insomnia due to other mental disorder: Secondary | ICD-10-CM

## 2024-02-15 DIAGNOSIS — F4001 Agoraphobia with panic disorder: Secondary | ICD-10-CM | POA: Diagnosis not present

## 2024-02-15 DIAGNOSIS — F314 Bipolar disorder, current episode depressed, severe, without psychotic features: Secondary | ICD-10-CM

## 2024-02-15 DIAGNOSIS — F428 Other obsessive-compulsive disorder: Secondary | ICD-10-CM

## 2024-02-15 DIAGNOSIS — G3184 Mild cognitive impairment, so stated: Secondary | ICD-10-CM

## 2024-02-15 NOTE — Progress Notes (Signed)
 Albert [name] [id number] 01-25-1966 58 y.o.    Subjective:   Patient ID:  Albert [name] is a 58 y.o. (DOB 10-29-1965) male.  Chief Complaint:  Chief Complaint  Patient presents with   Follow-up   Depression   Anxiety      Albert [name] presents to the office today for follow-up of mixed bipolar, anxiety and still grieving stress of breakup.  He requires frequent follow-up because of long-term symptoms.  At visit October 25, 2018.  We made several medicine changes because of his concerns about sleep and other issues.  We increased olanzapine  back to 7.5 mg bc not sleeping as well at 5 mg.  He has to have the prescription for quetiapine  written for up to 3 tablets at night in case insomnia is worse because insomnia makes his mood disorder so much worse.  We discussed that was above the usual max but the request was granted given his treatment resistant status. We also reduce lithium  from 900 mg daily to 750 mg daily to try to reduce tremor and muscle twitches.  At  visit March 28, 2019.  Fluoxetine  was increased to 40 mg daily.   Early January 2021 increased to 60 mg daily.  No SE.  seen June 28, 2019.  No further meds were changed except olanzapine  was increased back to 10 mg daily to see if if he could get additional benefit per his request.  Covid vaccinated.  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  Then had cholecystectomy.  April 2021 appt with the following noted: 2 episodes night sweats lately.  Memory has been very bad lately.  Repeatedly asks mother questions. Still  tend to stay in his room and his bed.  Still has rapid cycling mood swings.  Maybe some better with increase in olanzapine  to 10 and tolerating itl.  Would like to get out more but can't DT Covid.  Can perform necessary chores.  Will get out of the house when he can.  No change in death thoughts and anxiety in intensity but is better with frequency.  Usually comes and goes in waves but more persistent.  Consistent with meds.   Ativan  not helping anxiety very dramatically but he's not sure.  Fidgety.  No trigger other than still grieving relationship and can't get it out of his head.  Poor energy, concentration and more forgetful. In bed more and less active.  Sleep ok lately which is unusual.  Can concentrate on financial matters and stock market at times.  Tolerating meds.  Still some intrusive SI without reason. Less frequent obsessive thoughts about broken relationship and has been doing this for months.  Can't let it go.  Loop.   No unusual stress even with the family who is supportive. Likes the benefit that Zyprexa  gives. No sleepwalking nor falling nor odd behavior.  No history of sleep walking.  He understands this may recur with an increase.  Also wants option to rarely take extra quetiapine  300 for sleep prn. Plan:  Try to reduce lorazepam  if possible.  10/22/2019 appointment with the following noted: Continues fluoxetine  60, lithium  600 mg, lorazepam  2 mg AM and HS and 1 mg midday, olanzapine  10, quetiapine  600 mg HS. Still anxious chronically and including driving in crowded spaces.  Doesn't thing Ativan  helps as well as Xanax  but less cognitive problems. Sleep is not as good.  Occ EFA.  More EMA and wanting to do things in the middle of the night but this  is not typical.  Wonders about why that happens.  Some napping.   Tolerating meds.  Asks about weight loss meds.  Disc this in detail.   Plan no changes except OK meclizine  prn vertigo.  02/11/20 appt with the following noted: Able to gradually reduce lorazepam  to 1 mg AM and HS. More depressed over time and less interested in things and less interest in going out but does with his parents. Has reduced from 800 to 600 mg HS with some awakening but usually able to go back to sleep.Albert [name]  Spending a good amount of time in bed bc watches TV in bedroom.  Parents watch TV in different part of the house.  No mood swings he notices.   Concerns about weight gain about  200#. Likes olanzapine 's benefit for sleep. Plan: Trial for TRD to  Increase fluoxetine  to 80 mg daily  to use the combo with olanzapine  for TR bipolar depression.   04/21/20 appt with following noted: I thought in beginning some benefit with fluoxetine .  Lifelong negative thinking continues.   Not much bipolar.  Reads a lot on bipolar.   Still tired and anxious a little more may be seasonal.  No familial stressors.  Tends to lay down in afternoon.  Anxiety not over anything in particular.   No SE with fluoxetine . Took meclizine  prn.  Easily motion sick.    Asks questions about newer drugs for bipolar like Caplyta . Plan:  No med changes  06/30/20 appt noted: Tired of wearing masks.  Vaccinated.  Asked questions about when this will end with mask mandate.  Mood up and down some since here and getting up 2-3 times.  Disc awakening problems.  Trying to do better bc night eating some.   Mood is about average today so far.  Albert [name] out with friends for breakfast.  Recognizes activity helps mood.   3 days in a row napped a lot in the afternoon.   Bed is a comfort place.  Gets bored and hard to motivate.  Tries to stay away from night eating and spending.   More depressed when alone and wonders about med changes. Plan: Failed response to olanzapine  + fluoxetine  for TR bipolar depression.   Reduce fluoxetine  to 1 daily and reduce olanzapine  to one half nightly for 10 days and then stop it. Then start Caplyta  1 daily  08/27/2020 appointment with the following noted: Patient decompensated with the above switch to Caplyta  with intrusive suicidal thoughts and had to be hospitalized for psychiatric reasons.  Multiple phone calls with family members since that time.  Spoke with the psychiatric nurse practitioner with the decision to restart olanzapine  in place of the Caplyta  given the patient was more stable while on the combination of Seroquel  and olanzapine  than with the Caplyta . Caplyta  triggered HI/SI and  depressed and confused on it, and agitated and still has some of it now. No akathisia. Frustrated with lack of therapy at the hospital.   Hydroxyzine  didn't help jitteriness.  Feels some better.  Fleeting SI & HI but not obsessive like it was prior to hospitalization.  Feels a little amped up and nervous.  Scared of what could happen.  Apprehensive being here talking about things. 5-6 hours sleep last night and would like to have more. At mom and dad's house right now and up more in the day. Inadvertently stopped lorazepam  abruptly by mistake likely contributing to shakiness. Still some memory issues.  Trying to stay out of bed when watching TV.  Some awakening but managing. Occ fleeting SI and distracts himself.   Always spends a lot of time just laying around.   Can have anxiety for no reason like coming here, and varies in intensity without pattern.      Less panic than in the past.  Some chronic depression and hyperactivity and loudness and hyperverbal.  Usually back to sleep.  Total 6 hours but some napping, awakens 2-4 times nightly but back to sleep..  Taking quetiapine  600 mg HS.  still lacks interest and motivation.  Can follow a TV show if interested. Plan: Restart lorazepam  1 mg in the morning, 1 mg in the afternoon and 1 mg at night for anxiety Stop hydroxyzine   Increase olanzapine  to 1 and 1/2 of 10 mg tablets in evening  09/05/2020 appt noted: seen with parents today at his request Made med changes noted and tolerated the changes Better than I did last week. Now only fleeting HI/SI and no where near what it was.  Sleeping better.  Still not a lot of energy.  Anhedonia.  Less agitation.  A lot of anxiety all the time but it's helping.  Would like more energy and motivation.   Doesn't handle stress well. Parents note he's less shakey.  He's still staying with his parents.  Only driven once since this happened and F wants him to be able to drive and function more independently.  M agrees  he's better.  Memory is better but not normal.  Primarily STM problems No akathisia with olanzapine  this time.    Administering his own meds but mo watched. Slept 8 hours last night. Plan consider further reductions in quetiapine   10/17/20 appt noted: Stayed on Seroquel  600 HS and olanzapine  15. Olanzapine  seemed to do most for the sleep.  Also helping eliminated HI/SI for the most part.  Wants to try to increase it.  Depression is much better with less rumination also.  Fears akathisia at higher dose as in the past. Also on fluoxetine  40.   Plan: Reduce Seroquel  to 1 and 1/2 tablets at night Increase olanzapine  to 1 of the 15 mg tablet and 1/2 of the 5 mg tablet for 1 week,  Then, if tolerated increase the olanzapine  to 20 mg or 1 of the 15 mg tablets and 1 of the 5 mg tablets. If possible then also reduce quetiapine  to 1 tablet at night.  11/05/2020 appointment with the following noted: Up to olanzapine  20 mg hs for 3 days.  Reduced Serooquel to 450 mg HS.  Didn't try to go lower. Had hiatal hernia and umbilical hernia surgery recently with no problem.   No akathisia so far. Sleep ok with meds so far changes.  No mood changes yet but overall likes the smoothness of olanzapine  and helping sleep without knociing him out. Still some occ HI/SI, he thinks bc of anxiety generally.  Easily anxious. Plan: Continue olanzapine  20 mg nightly longer to give it time to help mood sx reduce quetiapine  to 1 tablet at night to minimize polypharmacy and reduce risk of akathisia.  01/07/21 appt noted: Able to reduce quetiapine  300 mg HS ok with decent sleep but still wakes a lot.  No AM hangover.  Don't have a lot to do.  Can enjoy going out and doing things.  But doesn't do it longterm.  Family is doing good.  Father started year 71 at his job.   Tolerating the meds well.  May wake more with quetiapine  reduction. Lost 10#. Wonders if  could try other meds he didn't tolerate before bc now can tolerate olanzapine   and couldn't tolerate it in the past DT akathisia. Mood never great but can swing into depresssion without reason.   Plan: Continue olanzapine  20 mg nightly longer to give it time to help mood sx reduce quetiapine  to 1/2 of the 300 mg  tablet at night to minimize polypharmacy and reduce risk of akathisia.  02/02/2021 appointment with the following noted: Tried reduced quetiapine  to 150 mg and after a week had withdrawal sx including jittery and SI and he increased to 250 mg daily.  Felt better after increasing to 300 mg daily and last week reduced to 250 mg daily.  After increase quetiapine  felt better in a few days.  Seems more alert with less quetiapine  but can't tolerate a quick withdrawal.   Some awakening.  Plan: Reduce quetiapine  by 50 mg every 2-4 weeks.  03/09/2021 phone call: Pt called and said that he is weaning off the seroquel . He was down to 200 mg of the seoquel and he started having withdrawal symptoms and sucidal thoughts. He went back up to 250 mg because he knew he was good on that.    Pt stated he starting back taking 250 mg on Saturday due to the symptoms he was having.Now he is no longer having suicidal thoughts but still very jittery. MD: He may just need to taper slower than most people.  If he can tolerate the 250 mg Seroquel  with the jitteriness it should get better over the next 1 to 2 weeks.  Then he can try going down again by 50 mg at a time.  He probably needs to go down by just 50 mg every 2 to 4 weeks and wait till he fully adjusts to each reduction before he reduces again.  03/17/21 appt noted: Hernia surgery 2 weeks ago. Seroquel  250 mg for a month then 200 mg daily and had SI and increased again to 300 mg daily.  Would love to get off it.  Withdrawal triggers intrusive HI/SI but otherwise doesn't have them No SE. Sleep is OK but not enough REM sleep.  Satisfied with other meds.   No current HI/SI.  No paranoia.  Still has anxiety and taking Ativan  every 6 hours.   It works but doesn't cure it. Chronic anxiety and ask about ketamine for chronic depression. Plan: Because he had WD reducing from 250 to 200 mg daily will have to go slowly. Reduce quetiapine  by 25 mg every 2-4 weeks. Reduce Seroquel  to 250 mg for 2 weeks, Then reduce to 225 mg for 2 weeks, Then reduce to 200 mg for 2 weeks, Then reduce to 175 mg for 2 weeks, Then reduce to 150 mg Also: Buspirone  30 mg tablets for anxiety Start 1/3 tablet twice daily for 1 week Then increase to two thirds twice daily for 1 week Then increase to 1 tablet twice daily  04/15/2021 appointment with the following noted: Reduced Seroquel  to 200 mg HS and increased buspirone  to 30 mg BID STM px and wants to try to reduce Ativan  to see if it is better.  But fears with drawal sx. Parents complain about his memory. Less HI and SI since here. Less depressed. Plan: Buspirone  30 mg tablets for anxiety Start 1/3 tablet twice daily for 1 week Then increase to two thirds twice daily for 1 week Then increase to 1 tablet twice daily Continue olanzapine  20 mg nightly longer to give it time to help mood  sx Because he had WD reducing from 250 to 200 mg daily will have to go slowly. Reduce quetiapine  by 25 mg every 2-4 weeks. Reduce Seroquel  to 250 mg for 2 weeks, Then reduce to 225 mg for 2 weeks, Then reduce to 200 mg for 2 weeks, Then reduce to 175 mg for 2 weeks, Then reduce to 150 mg Trial taper lorazepam : In hopes of improving short-term memory  04/30/2022 phone call: Complaining of withdrawal symptoms reducing lorazepam  from 0.5 mg 5 times a day to 0.5 mg 4 times daily.  05/21/2021 appointment with the following noted: Gotten onto buspar  30 BID and down on lorazepam  to 0.5 mg QID. Gotten down to Seroquel  200 mg HS A little restless every now and then but able to reduce lorazepam  for the last weeks. Is too inactive and lays in bed too much watching TV.   Can' t tell effect of buspirone  bc of other changes.   Sleep with awakening with 6-7 hours. Fleeting SI without trigger or plan or intent. Depressed more than anxious. Enjoys watching stocks and investing. Plan: Trial taper lorazepam  gradually over time In hopes of improving short-term memory But for now continue lorazepam  0.5 mg QID Start clonidine  0.1 mg tablets one half at night for 3 nights, then 1/2 tablet twice daily for 3 nights, then one half in the morning and 1 tablet at night If clonidine  does not noticeably help anxiety call the office  05/27/2021 phone call initially complaining of homicidal and suicidal thoughts after taking 1 mg of clonidine .  He was told to stop the medication.  But then called back stating he was having homicidal and suicidal thoughts from withdrawal from the medication which was not logical.  He agreed it was not logical and stated on 05/29/2021 that he felt better  06/23/2021 appointment the following noted: No clonidine . Sunday panic attack eating out and then having SI.  Stayed with father to feel safer. Intrusive thoughts of hurting parents or others.  HI only when has SI.  Usually panic without SI.  Then had intrusive thoughts about hurting others.   Usually intrusive thoughts are brief and fleeting.    07/20/21  appt noted; More anxious with less lithium .  More anxious in his body.  Not more depressed or irritable.  Sleep is the same.  More racing thoughts.  Chronic SI probably a litte worse but not unmanageable. Rduced lithium  last week to 300 mg daily. No recent change in lisinopril . Off the clonidine  for a week or so. Doesn't drink much water.  Drinks a lot of diet coke. No marked akathisia or RLS but does shake his leg DT anxiety. Worries about needing mor emedicine. Plan: Increase olanzapine  to 25 mg nightly bc more racing thoughts and anxiety with less lithium  DT severity of sx and difficulty getting off Seroquel  conside rincrease.  07/30/21 TC : CO racing SI today and wonders about whether increased  sx racing thoughts, hyperactivity, SI related to Reduced lithium  from 600 mg daily to 300 mg daily recently DT lithium  level 1.8.  could be related.    Plan: increase olanzapine  30 mg pm Today take lithium  600 mg now.  Tomorrow start lithium  CR 450 mg daily Continue olanzapine  30 mg PM until SI resolves unless akathisia returns.  Lorene Macintosh, MD, DFAPA  08/17/21 appt noted:  No akathisia with olanzapine  30 mg pm (about 3 weeks) and is sleeping better. Increased lithium  to 450 mg daily. Tolerating meds. Still racing negative thoughts maybe some better.  Asked about it. SI come and go.   Anxiety about the same as depression.  No motivation unless has to do something. Plan: Reduce  fluoxetine  20 mg 1 daily in hopes racing thoughts and anxiety are better.  09/14/2021 appointment with the following noted: No difference noted with reduction fluoxetine  20 mg daily. Most of the time negative thoughts, ex what if I get in a wreck?  Frequent negative thoughts.   Still on lorazepam  0.5 mg TID.   Wonders about trying prior meds that caused akathisia bc no longer gets it with olanzapine  30 mg daily. Sleep is usually ok.  Taking Seroquel  200 mg HS.  Thinks the olanzapine  helps his sleep and doesn't want to stop it. Panic 1-2 times per month.  Then takes extra lorazepam . Plan: DC fluoxetine  20 mg 1 daily  Trial Auvelity off label for depression 1 in the AM for 1 week then 1 in AM and 1 with evening meal  10/15/2021 appointment with the following noted: Occ of getting confused as to day and went to church on a Thursday my mistake.   Had it happen a few months ago too.  Memory is terrible. Thinks he couldn't tolerate more Auvelity. Not a lot of benefit from [place] 1 daily. Still quite a bit of anxiety.   A little drowsy  and sleeps 7-8 hours at night. Word finding problems.  Lost wallet. Plan: DC  Auvelity  For [facility]: memantine  1/2 tablet in the AM for 1 week, then 1/2 tablet twice daily, then 1/2  tablet in the AM and 1 tablet at night for 1 week then 1 tablet twice daily  10/22/2021 phone call complaining of additional stress asking to start new medication because of anxiety and more suicidal thoughts without intent or plan.  Reports taking more than 4 prescribed Ativan  a day recently.  Asking to increase olanzapine  which is already at 30 mg daily. MD response:We cannot go any higher in the dosage of olanzapine  that he is currently taking.  A lot of these suicidal thoughts are apparently anxiety driven.  Let us  have him resume taking paroxetine  which is one of the more effective medicines for anxiety.  I will send it in and the instructions will be paroxetine  20 mg tablets 1/2 tablet daily for 1 week then 1 tablet daily  11/16/21 appt noted: Multiple phone calls since she was here.  Complaining of suicidal thoughts from memantine  which was stopped. Started paroxetine  and up to 20 mg daily.  Continues olanzapine  30 mg, lithium  450 mg nightly, lorazepam  0.5 mg 4 times daily , And quetiapine  200 mg nightly. Questions about whether memantine  caused the neg SI really bc has them at times. When has SI often anxiety driven.  Will tend to ruminate on past relationship ended.   Edgy and irritable all the time but memantine  seemed to make it worse. Mind is getting better with less SI lately.   No SE with paroxetine .   Aggrivated by forgetfulness.  eXample, talked with someone about going to breakfast last night and then forgot to go this mornin.g. Plan prescribed donepezil  5 mg daily for cognitive complaints specifically forgetfulness  12/31/2021 appointment with the following noted: He had been prescribed donepezil  5 mg for cognitive complaints.  He called back reporting he felt it was causing homicidal thoughts and was instructed to stop it.  He has had these types of thoughts in the past.  He had no desire to act on them. Discouraged. Ongoing depression and reduced  enjoyment and negative thoughts.   Can enjoy some things. A lot of anxiety chronically.  Trying to limit Ativan  to 5 of 0.5 mg daily.  Sometimes needs 2. Constipation for years.  02/03/22 appt noted: Depression makes LBP worse. Ongoing chronic depression. Tolerated rivastigmine  without triggering SI Chronic anxiety and Ativan  helps some. Occ panic but not usual.  Asks if any other meds coud be used. Plan: But for now continue lorazepam  0.5 mg QID, but use LED for cog reasons Increase rivastigmine  to 3 mg capsule 1 twice daily or 2 of the 1.5 mg capsules twice daily.  Call if you have problems with nausea Start dextromethorphan  1 capsule twice daily . Thi sis off label for depression based on MOA of Auvelity and use of paroxetine  to prolong half life of DM. Presence of paroxetine  may have been reason he didn't tolerate Auvelity brief trial before.  Will proceed slowly.  03/17/2022 appointment noted: Current psych meds: Rivastigmine  3 mg twice daily, quetiapine  200 mg nightly, paroxetine  40 mg daily, olanzapine  30 mg nightly, [place]  10 mg daily, lorazepam  0.5 mg 4 times daily, lithium  CR 450 mg nightly, dextromethorphan  15 mg twice daily A lot of anxiety since here.  Some days taken more lorazepam  to 6-7 tablets daily.  No major triggers but anxious even about coming here and other normal things.  Cancelled trip to collect rocks with friend and cancelled the trip bc anxious.  Usually if can go he is ok.  Lots of SI ongoing Memory might be a little better. Asks how he responded to V[place]lar.  Will check paper chart.  03/24/22 TC:  CO SI worse and restilless with Rivastigmine  9 mg . MD response:  Stop the rivastigmine  since the [place] appears to be having side effects from it.  He was having anxiety at the last appointment 2 and then we increased the dose.  This might be causing the anxiety to be worse. He has chronic suicidal thoughts without intent or plan.  However it appears the thoughts are more intense with the rivastigmine       05/06/21 appt noted: Stopped rivastigmine  DT anxiety and worsening SI.  Was better the next day. This week had some SI.  Thinking about the prior relationship tends to lead to SI. Will take Ativan  when gets SI and it helps a little at 0.5 mg at a time. Plan: Consider fluvoxamine .  Yes this may help with obsessions on old GF and he failed other optioons. Start fluvoxamine  1/2 of 50 mg tablet and reduce paroxetine  to 1 and 1/2 of 20 mg tablets for 5 days, Then increase fluvoxamine  to 1 tablet daily and reduce paroxetine  to 1 tablet daily for 5 days, Then increase fluvoxamine  to 1-1/2 tablets daily and reduce paroxetine  to 1/2 tablet daily for 5 days, Then increase fluvoxamine  to 2 tablets daily and stop paroxetine    06/07/22 appt noted: Feels like the fluvoxamine  50 BID is too much.  On this a couple of weeks. Worries he might be worse with this with regard to SI.    He thinks it's worse with increase.   New neighbor who has a young boy. He worries that Albert [name] is triggering depression and intrusive SI/HI a couple of days later and lasts a little while. Has had anxiety turning into panic attacks.  This gets associated with depression and SI/HI.  These thoughts are intrusive and he doesn't want to act on them.  Can have HI even towards family with whom he has no negative  emotions.  Little things like a bump up in parking lot sets off his depression and anxiety and doesn't seem to handle things.   No sig akathisia.  In the past it's been continuous.   Awakens every 2-3 hours.  Usually goes back to sleep with meds. Has not had alcohol in 20 years and had some intrusive thoughts without drinking.   Plan: As soon as he feels comfortable increase fluvoxamine  to 50 mg AM and 100 mg PM for intrusive thoughts of HI/SI and obs on GF  07/06/22 appt noted: Increased fluvoxamine  to 150 mg daily and felt more agitated and some SI so reduced to 100 mg 4 days ago and feels better so far.  Likes the fact it is good  for anxiety and obs thoughts.  Wonders if there is a similar med.  Frustrated he can't get relief.  Some improvement in obs on exGF but not resolved. Continue other meds: lithium  CR 450 mg HS, lorazepam  0.5 mg QID, Memantine  10 BID, olanzapine  30 mg HS, quetiapine  200 mg HS Tingling and burning in legs at night but doesn't interfere with sleep. Got insurance to pay for [facility] and lost 13#.   No full panic lately. But some anxiety waves.   Still major problems with memory. Plan: Wean fluvoxamine  50 mg daily for a month then 1/2 daily for 2 weeks then stop it DT  NR.  Watch for more anxiety.  07/28/22 TC: [name], [name]  to Me     07/28/22  4:16 PM Note Mom called reporting patient has been confused, dizzy, and sluggish for the last 3 days. He is now staying with her. She had several questions in regards to this:   Lithium  level - she feels he needs one done, last result in Epic was 08/2021 that I see. She would like order sent to [facility] for tomorrow.   Patient is on Wegovy, has been for about a month, and she wonders if this could cause sx.   Patient has meclizine  and she is questioning if she should give that for his dizziness considering other sx.   She questions if perhaps he got up during the night and took additional medications, though doesn't know if this happened and if so what he took.    On 3/5 visit he was to wean fluvoxamine  to 50 mg qd for a month and then 1/2 tab for 2 weeks and then stop. Mom and pt unsure if he started the wean.    He has F/U 4/4.        Me  to [name], [name]   CC   07/28/22  5:44 PM Note Yes get lithium  level in the AM and don't take any lithium  tonight or in the morning before the blood test.     [name] should not cause these sx except if he is losing wt quickly or is dehydrated it can cause lithium  toxicity and include these sx.  He needs to drink lots of water.   He should go ahead and wean the fluvoxamine  as instructed in the last  appt.   If the sx get worse, go to the ER.    Me   CC   07/29/22  6:49 PM Note RTC   Sx not as severe as when he had to go to hospital in the past.  [name] got Lithium  level today.   Speech is not good .  Confused at times, but better today.  A little  tremor .  A little balance problems.  No falls.  No diarrhea Took lihtium 450 last night. Mo says he got up in the middle of the night and moved things around last night and he doesn't rmember.   No change in night time meds.   Gradually reducing fluvoxamine .   No extra lorazepam .     Spoke with mother who verifies the same.   No med changes but push fluids.   Lorene Macintosh, MD, DFAPA      08/05/22 appt : with parents Pt [name] 08/02/22 with acute kidney injury and somewhat elevated lithium  level with some confusion.  Spoke with psych NP at [name] and lithium  was decreased to 450 mg HS. No pending appt known with medical doctors about kidney function. Don't feel good and having HI and SI and feels he needs lithium  for these issues.  Doesn't feel he should have been discharged.   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 No added meds at night but has been confused at night and getting up multiple times and doing things he doesn't remember.  This concerns parents.  Parents been up at night with him and he doesn't make sense.  Still groggy in the am per mo for an hour.   Plan: Plan: Get lithium  level and kidney test ASAP Stop fluvoxamine  Reduce lithium  300 mg capsule 1 at night Monday night: reduce to olanzapine  to 20 mg at night (stop the 10 mg tablet) and start clozapine  prescription. Monday 4/15 : increase clozapine  to 100 mg at night and reduce quetiapine  to 1/2 of 200 mg tablet and continue olanzapine  20 mg each evening.  08/01/22 & 08/11/22 ED for HI and SI  08/12/22 TC mother:  Mother, [name] called. Per note made by [name]. He was referred to [facility].Reporting that he did go up there last night. This morning they called her and told her  to come pick him up. They told her he wasn't having homicidal or suicidal thoughts and it would be four to five days of him waiting to go to a facility in [place]. They recommended him for PHP to start 4/16. She picked him up today. He is worried about these thoughts coming back up. Also, [facility] came out yesterday to assess him and offered counseling from [facility]. He wants to do counseling with them.  Any input on care he can receive?  He is so discouraged and wants to be safe.  (She notes that the [facility] gave him- Zyprexa  10mg  at 1:07am and again at 1:25am per the med list- based on his previous med list) He was supposed to be off of Zyprexa10mg . Has been on  Clozapine  since Monday. He did go ahead and take it before he left ( 2pills) so he hasn't missed a dose and did go get labwork done. Is wanting to continue the Clozapine , please check the labwork to be able to fill the 100mg  dose.    MD resp:  Continue all meds as RX except go ahead and increase clozapine  to 100 mg tablets, 1 each night as soon as he can get the 100 mg tablets from pharmacy.  RX sent.  Trying to speed up his chance of response by getting it to a hgiher dose.  Clozapine  is very effective for HI/SI.  If he gets worsening HI/SI in the interim it will not be caused by meds but just the natural waxing and waining of his sx.  Once clozapine  dose gets high enough it should help but  avg patient needs 300 mg so we have to just be patient.      08/20/22 appt:  Up to 100 mg clozapine  HS SE constipation and using Colace, Miralax .  Been to [facility] twice  since here.   Ongoing continuous HI/SI.   All I want to do is go to sleep to escape.  Dep causing the thoughts.   Taking Ativan  0.5 mg BID-QID.  He thinks mother not giving him enough of it.  He thinks anxiety triggers HI/SI.  He fears HI and hopes he's strong enough to reisist.  Not angry or wanting to hurt anyone in reality but has the intrusive thoughts. Conc and memory still not good.   Sleep not much  different.   Staying with parents currently. Dep gotten worse M worrying he's dependent on lorazepam .   Confusion at night is no longer happening.  Not wandering at night.  Plan: For severe constipation start Linzess  145 mg AM Get lithium  level and kidney test ASAP Stopped fluvoxamine  and confusion at night is gone  lithium  300 mg capsule 1 at night  olanzapine  to 20 mg at night  increase clozapine  to 150 mg at night for 3 nights then 200 mg HS   olanzapine  20 mg each evening. Continue lorazepam  0.5 mg QID, needs this and informed mother  09/07/22 appt noted: Psych meds: clozapine  250 MG HS, lithium  300 mg HS, lorazepam  0.5 mg BID-QID, olanzapine  20 .  Off Seroquel , linzess  290 mg daily, Miralax , Colace. Sleep 7-8 hours without change from before clozapine .   Mood is still very depressed and still gets anxiety. Still problems with constipation.   Dep is so much it is causing intrusive HI/SI.   No more bizarre behaviors at night.   Plan: Plan: Get lithium  level and kidney test ASAP  lithium  300 mg capsule 1 at night  olanzapine  to 20 mg at night  increase clozapine  to  300 mg HS as soon as tolerated and then 400 mg HS.  olanzapine  20 mg each evening. Continue lorazepam  0.5 mg QID, needs this and informed mother  10/07/22 appt noted: alone and with parents Rough month.  Altered voice for a couple of weeks or so..  Mind not good.  Feel out of sorts.  Jittery.  Constipation.  No appetite.  On Wegovy.  Saw GI doc.   May need to reduce Wegovy bc poor appetite and constipation.  Lost 25 #. Current psych med: clozapine  400 mg HS about a week or 2, olanzapine  20 mg HS, lithium  300 mg HS, lorazepam  0.5 mg QID. Reduced intrusive thoughts.  2 nights since here night eating.   Sleep 6 hours.  Limited napping during the day.   Anhedonia.  Lower motivation.  Tremor worse. M says he sleeps pretty well but awakens  BP is under control. Plan Reduce lithium  to 150 mg capsule 1 at night DT level  higher Reduce  olanzapine  to 10 mg at night  Continue clozapine  400 mg HS bc helping SI and HI and  Continue lorazepam  0.5 mg QID, needs this and informed mother  11/08/22 appt noted:  alone and with parents. Ongoing calls between visits Doesn't think clozapine  helping him sleep as well as olanzapine  for sleep or any other benefit. While at restaurant had intrusive HI/SI. Staying with parents seems to provoke intrusive HI.  Is grateful to them.  Sat woke up with them.  Sometimes goes away and sometimes not.  No know trigger and no intent. Saw PCP.  Off  Wegovy.  Started metformin . No effect noted from clozapine .   M limits his lorazepam .  PCP referring him to neuro to FU abnormal head scan.  He wonders if his speech and memory problems are related.   Plan: Cont clozapine  400 mg HS Check clozapine  level and then direct dosing.  11/16/22 TC from pt to disc level:  MD resp:  Clozapine  level ok but may benefit with increase to 500 mg nightly.  Tell him to increase with current supply.     12/06/22 TC from pt:  complaining of worsening intrusive ego-dystonic HI/SI without intent, desire or plan.   MD resp ok to increase clozapine  to 600 mg HS.  12/09/22 appt noted: Complaining of ongoing racing thoughts and worsening HI/SI without intent or desire or plan.  Racing thoughts chronic and other thoughts wax and wane.  Usually triggered if starts thinking of something negative Meds: clozapine  600 mg HS, lithium  150, lorazepam  0.5 mg QID, olanzapine  10 HS Sleep with EFA.   Will have trouble going to sleep sometimes with thoughts. Doesn't think clozapine  makes him sleepy.  Not really drowsy daytime. No dx OSA and no known apnea.  SE a little dizzy.  Constipation managed with BM every 2-3 days. Better than before.  PCP RX metformin  and it helped. Plan: Plan for intrusive HI/SI wean olanzapine  and trial of risperidone .  Reduce olanzapine  to 5 mg HS for 1 week then stop it.  Addressed his fears of not  sleeping off olanzapine . Then if sleeping start risperidone .  01/11/23 appt noted:  also seen with parents Psych meds: clozapine  600, lorazepam  0.5 mg QID, No olanzpaine ,  lithium  150 mg daily, no risperidone  yet. Lost 45# on Wegovy down to 150#.   Still having intrusive HI/SI as noted before. Sleep no worse.  Takes a couple of hours after clozapine  to sleep.  EFA 2-3 times nocturia.   Still racing thoughts.  Ongoing px with conc, memory,  planning Scored 73 on 100 pt test for cognition.   Some trouble with articulation and speech at times.   If could get rid of racing and intrusive thoughts I'd be 100% better. Neuro says no stroke history and low B12 and workup ongoing at [facility], Dr. IZORA Fairly. Memory is still terrible.   Plan: Start risperidone  2 mg tablet, 1 at night for racing thoughts and intrusive homicidal and suicidal thoughts. Call in 3 weeks if not better and we will increase the dose  02/09/23 appt: seen also with parents , 60 min  Lost to 152#. Meds: as above, risperidone  incr to 4.5 mg HS for intrusive HI/SI SE drooling off an on.  Tremor mainly worse with a little more risperidone  HI/SI intrusive thoughts wax and wane.  Worse when lays down. Takes 2 hours to fall asleep.  Needs to get OOB when awakens.  6 hours sleep. No falls.   Would rather try to increase risperidone  than other options.    04/12/23 appt virtual:  seen alone CC:  Multiple phone calls about ongoing intrusive, obsessive, ego dystonic HI/SI thoughts without plan or intent. Didn't come to the office bc of these thoughts.  I know I don't want to do it.  Can last four hours.  Sees drug advertisements that talk about SI and that will trigger obsessions about it. Meds: clozapine  600 mg HS,  lithium  150 mg HS, lorazepam  0.5 mg TID, risperidone  increased to 8 mg and now back to 4mg  HS.   Neuro sched MRI tomorrow. Enough sleep  5-6 hours at night.  Not napping.   Sometimes gets confused with med but not ususally.    No recent mania.   Depressed and anxious. Plan: AVS: Reduce risperidone  to 1/2 of 4 mg tablet in evening for 1 week then stop it. Reduce clozapine  to 5 tablets nightly Start fluvoxamine  100 mg tablet 1/2 tablet nightly for 1 week then 1 tablet nightly for obsessive, intrusive thoughts  05/25/23 appt in person:  not with parents, met with [name] with 2 sz and then had psych [name] since here.  Another EEG 06/03/23.  Neuro DR.  Loreli.  RX Keppra .  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 Psych med: clozapine  reduced to 300 HS, stopped ? Risperidone  he's not sure, clonazepam  0.5 mg BID, lithium  150 HS, trazodone  50 prn rare.  Keppra  500 BID. Trazodone  good sleep med. SE drooling again.   Not sure any mental health effect of Keppra .   Denies any street drugs despite positive UDS for ecstasy. Overall still having intrusive HI/SI without intent or plan chronically but maybe is a bit better than before [name] stay. Sleep is better.  Lost 40# on Wegovy.  Plan no change  06/22/23 appt noted: EEG shows some epileptiform activity with FU pending with another 4 hour EEG pending. Meds unchanged Overall mental health about the same or a tad bit better.  Seems he can distract himself from intrusive thoughts and they are less common than they were.   CC mouth watering from med.  Choking middle of the night.  Atropine  helps some but has to be repeated and pain in the ass.   Sleep 6 hours.  Sometimes taking trazodone  50.  Sleep is not that bad.  Psych med: clozapine  reduced to 300 HS, , clonazepam  0.5 mg BID, lithium  150 HS, trazodone  50 prn rare.  Keppra  500 BID. Plan no changes  07/20/23 appt noted:  alone and with parents. Psych med: clozapine  reduced to 300 HS, , clonazepam  0.5 mg BID, lithium  150 HS, trazodone  50 prn rare.  Keppra  500 BID. B12 oral.  EEG sched for 08/2023; appt 08/25/23 Maybe a little better but still same intrusive thoughts.  They cause him to have somatic  anxiety like palpitations.  You know I don't want to do any of those thoughts.    Out of the blue.   SE still drooling.  Using drops some.  But not often. [name] therapist in [place].  Likes her.  She's good at teaching . Awakening 2-3 times night nocturia usually.  Back to sleep ok Parents think he is better with more interest. Seems less disturbed .  More outgoing and social and mother agrees. Switched lorazepam  to clonazepam  0.5 mg BID seems to be working better and more calming and lasts longer.  08/18/23 appt noted: Med: clozapine  reduced to 300 HS, , clonazepam  0.5 mg BID, lithium  150 HS, trazodone  50 prn rare.  Keppra  500 BID. B12 oral.  Appt next week with Dr. Loreli to review EEG. No med changes.  Still on above. SE drooling. No effect from atropine  2 drops . Mood not worse but not a whole lot better Anxiety still better with clonazepam  vs lorazepam . Sleep pretty good 5-6 hours and would like to sleep more.  Nocturia.   Not sedated from clozapine .  Rare naps. No other medical px except about EEG.   Low motivation bc doesn't feel like it.  So disgusted with lack of response with meds.   Does  limited chores.  Has cleaning lady.  At parent's house off and on. Go through the motions.  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  Just lay around.   Plan: No med changes.  Will wait to make med changes until after EEG work up completed.    09/19/23 appt noted:  disc plan with parents.  Med: clozapine  reduced to 300 HS, , clonazepam  0.5 mg BID, lithium  150 HS, trazodone  50 prn rare.  Keppra  500 BID. B12 oral.  Neuro  Dr Maree EEG spikes not likely to cause psych sx Frustrated with chronic psych sx dep.  Gets angry over the fact that no meds seem to help. Goes out to eat and enjoys food but doesn't feel like doing much.   No sig change in psych sx.  Chronic anxiety and dep.  Anxiety is better with clonazepam  than other meds.  Will get triggered in loud places with agitation.  Low tolerance of  stimulation. OCD therapist Albert [name] , [place], said she couldn't help his chronic intrusive HI/SI.   Still has spells of intrusive HI/SI Plan no changes  10/24/23 appt noted:  parents here today Med: clozapine  reduced to 300 HS, , clonazepam  0.5 mg BID, lithium  150 HS, trazodone  50 prn occ.  Keppra  500 BID. B12 oral.  Atropine  drops at night.   No SZ in over 6 mos.  Not driving again yet. Trazodone  works when needed.  Likes it .   Some panic but less severe and frequent.  Not as bad.   SE no much except drooling ongoing.  Uses atropine  drops at night Don't like things I used to like but not really new.   Clozapine  good for sleep.  Still some intrusive thoughts but not as bad as they were. F 53 yr at [employer].  Generally dep is worse than anxiety.  Anhedonia and intrusive thoughts.  Plan: no changes  12/14/23 appt noted:  Med: clozapine   300 HS, , clonazepam  0.5 mg BID, lithium  150 HS, trazodone  50 prn occ.  Keppra  500 BID. B12 oral.  Atropine  drops at night.   Intrusive thoughts and SI gone down some.   Atropine  drops not working well enough so not using it.   Doesn't stop drooling at night.   SE drooling and constipation.  Seeing GI Chronic dep and anxiety .  Some ongoing anhedonia.  Would like more relief.  No SZ and no stroke like SX Trazodone  works when needed.  Some dep cycling without pattern or pcpt. M helps with meds.  BC is still forgetful  Plan: Retry Vraylar 1.5 mg every other day  12/23/23 TC Pt reports he took Vraylar 4 days, W-F-M-W and he started having SI on Tuesday.  He reports sx were less yesterday and a little better today, has not taken it today. He said you told him he shouldn't have SI with it. No other sx reported. The suicidal thoughts were no worse than what he has reported previously, but said he is pretty good at gaging his sx and was afraid to continue the medication. Reports he feels so bad all the time and he is anxious to feel better.  Has FU 9/17.  Took it MW for 2 weeks without akathisia, but then intrusive worsening of SI    MD resp: DC Vraylar  01/18/24 appt noted:  Med: clozapine   300 HS, , clonazepam  0.5 mg BID, lithium  150 HS, trazodone  50 prn occ.  Keppra  500 BID. B12 oral.  Atropine  drops at  night.   Foul mood with irritability.  SI better off Vraylar.  Down bc tried so many things that haven't worked.   Gets out and does things but doesn't really want to do it.  Not much to do during the week and just lies around.  Does not do many chores.   Anhedonia and ex not doing Ham radio as in past.   FU neurology in Oct and will disc memory concerns. Still feels need for mother to help with meds.  Plan: trial Fanapt .  02/15/24 appt noted:  Med: Fanapt  4 mg HS, clozapine   300 HS, , clonazepam  0.5 mg BID, lithium  150 HS, trazodone  50 prn occ.  Keppra  500 BID. B12 oral.  Atropine  drops at night.   No better with dep.  Not as many intrusive thoughts but still down.  No clear change in anxiety. SE No lightheadedness, akathisia. Minimal napping.  Sleep 6 hours or more at night Sees Dr. Maree neuro next week.  Residual stroke sx speech and memory.  Rarely needing trazodone .   No other SE issues.   Still will tend to ruminate on old relationship, causes anxiety though no contact with her.  Decided more even anxiety benefit with clonazepam  vs lorazepam .  Anxiety less on clonazepam .  Psych med hx extensive including ECT and  Risperidone  Maxed out risperidone  8 mg without benefit for intrusive thoughts  Zyprexa  30 Latuda 80 which caused akathisia,  Vraylar 1.5 every other day for 2 weeks; worsening intrusive thoughts,  Rexulti, aripiprazole 20 mg with akathisia,  Seroquel  1000 mg,  InVega, Geodon , Saphris with side effects, symbyax, Fanapt  4 HS .  Caplyta  SE and markedly worse. Clozapine  started end of March 2024; highest dose 600 highest dose lithium  1200 SE,   lamotrigine 300 mg, Depakote 2000 mg, Tegretol, Trileptal  and several of these in combinations, gabapentin , Keppra   N-acetylcysteine, Nuedexta,     Belsomra with no response,  Lunesta no response, trazodone  200 mg,  Xanax , clonazepam , lorazepam  less sedation. Buspirone  NR Clonidine  SE with 2 trials  Viibryd 40 mg for 3 months with diarrhea,  protriptyline with side effects,  Trintellix 20 mg,  Parnate 50 mg with no response,   Emsam 12 mg for 2 months,   imipramine,  venlafaxine,   bupropion side effects,  Lexapro 20 mg, sertraline, paroxetine , Deplin, fluoxetine  80,  fluvoxamine  150 agitated; retrial ? Sz related.   Auvelity NR at one daily.  SE BID  methylphenidate 60 mg,  Vyvanse, Concerta, strattera, , modafinil,  Memantine  worsening SI? Donepezil , complained of HI, Exelon  3 BID rivastigmine  DT worsening SI  pramipexole,  amantadine ,  Patient prone to akathisia.  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 Neuro [facility] Dr. IZORA Maree, [facility] in progress.   EEG on 06/03/23 bc recent sz 08/25/23 appt Dr. 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 No breakthrough seizures since last visit. Currently on levetiracetam  500 mg twice daily. Requests a once-daily regimen. Epileptiform discharges necessitate continued anti-seizure medication to mitigate recurrent seizure risk. Discharges are insufficient to cause psychiatric issues. Levetiracetam  XR offers once-daily dosing convenience without altering the total daily dose. - Switch to levetiracetam  1000 mg XR once daily, to be taken at nighttime.     Review of Systems:  Review of Systems  Constitutional:  Positive for appetite change and fatigue.  Cardiovascular:  Negative for chest pain and palpitations.  Gastrointestinal:  Positive for constipation.  Musculoskeletal:  Positive for back pain.  Neurological:  Positive for dizziness and tremors. Negative for weakness.        Fidgety  Psychiatric/Behavioral:  Positive for decreased concentration and dysphoric mood. Negative for agitation, behavioral problems, hallucinations, self-injury, sleep disturbance and suicidal ideas. The patient is nervous/anxious. The patient is not hyperactive.     Medications: I have reviewed the patient's current medications.  Current Outpatient Medications  Medication Sig Dispense Refill   acetaminophen  (TYLENOL ) 500 MG tablet Take 1,000 mg by mouth every 6 (six) hours as needed for moderate pain.     atropine  1 % ophthalmic solution PLACE 2 DROPS UNDER THE TONGUE THREE TIMES PER DAY AS NEEDED AS DIRECTED 5 mL 2   cholecalciferol  (VITAMIN D3) 25 MCG (1000 UNIT) tablet Take 1,000 Units by mouth daily.     clonazePAM  (KLONOPIN ) 0.5 MG tablet Take 1 tablet (0.5 mg total) by mouth 2 (two) times daily. 60 tablet 3   cloZAPine  (CLOZARIL ) 100 MG tablet Take 3 tablets (300 mg total) by mouth at bedtime. 270 tablet 0   Cyanocobalamin  (VITAMIN B-12 IJ) Inject as directed every 30 (thirty) days.     esomeprazole (NEXIUM) 40 MG capsule Take 40 mg by mouth daily.     fenofibrate  (TRICOR ) 145 MG tablet Take 145 mg by mouth daily.     iloperidone  (FANAPT ) 4 MG TABS tablet Take 1 tablet (4 mg total) by mouth 2 (two) times daily. (Patient taking differently: Take 4 mg by mouth at bedtime.) 60 tablet 0   levETIRAcetam  (KEPPRA ) 500 MG tablet Take 1 tablet (500 mg total) by mouth 2 (two) times daily. 60 tablet 0   levothyroxine  (SYNTHROID , LEVOTHROID) 75 MCG tablet Take 75 mcg by mouth daily before breakfast.     LINZESS  290 MCG CAPS capsule Take 290 mcg by mouth daily.     lithium  carbonate 150 MG capsule Take 1 capsule (150 mg total) by mouth at bedtime. 90 capsule 1   metFORMIN  (GLUCOPHAGE -XR) 500 MG 24 hr tablet Take 500 mg by mouth 2 (two) times daily with a meal.     sildenafil  (VIAGRA ) 100 MG tablet Take 1 tablet (100 mg total) by mouth daily as needed for erectile dysfunction. 30 tablet 6    tamsulosin  (FLOMAX ) 0.4 MG CAPS capsule Take 1 capsule (0.4 mg total) by mouth daily. 90 capsule 3   traZODone  (DESYREL ) 50 MG tablet Take 1 tablet (50 mg total) by mouth at bedtime as needed for sleep. 90 tablet 0   No current facility-administered medications for this visit.   Medication Side Effects: Other: mild sleepiness.  Occ twitches.\, tremor  Allergies:  Allergies  Allergen Reactions   Meloxicam Other (See Comments)    Dizziness   Zyprexa  [Olanzapine ] Other (See Comments)    Akathesia   Nsaids Other (See Comments)    Hallucinations   Ibuprofen Other (See Comments)    Can not take because taking lithium    Prednisone Other (See Comments)    Can't sleep    Wellbutrin [Bupropion] Other (See Comments)    Suicidal thoughts    Past Medical History:  Diagnosis Date   ADHD (attention deficit hyperactivity disorder)    Anemia    Anxiety    Bipolar disorder (HCC)    BPH (benign prostatic hyperplasia)    Chronic kidney disease, stage 3b (HCC)    Constipation    DDD (degenerative disc disease), cervical    Depression    Deviated septum  ED (erectile dysfunction)    GERD (gastroesophageal reflux disease)    Graves disease    History of hiatal hernia    Hypertension    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    Hypothyroidism    Pneumonia    PONV (postoperative nausea and vomiting)     Family History  Problem Relation Age of Onset   Prostate cancer Neg Hx    Bladder Cancer Neg Hx    Kidney cancer Neg Hx     Social History   Socioeconomic History   Marital status: Single    Spouse name: Not on file   Number of children: Not on file   Years of education: Not on file   Highest education level: Not on file  Occupational History   Not on file  Tobacco Use   Smoking status: Former    Current packs/day: 0.00    Types: Cigarettes    Quit date: 2010    Years since quitting: 15.7    Passive exposure: Past   Smokeless tobacco: Former     Types: Associate Professor status: Never Used  Substance and Sexual Activity   Alcohol use: Not Currently   Drug use: Never   Sexual activity: Yes  Other Topics Concern   Not on file  Social History Narrative   Not on file   Social Drivers of Health   Financial Resource Strain: Low Risk  (11/08/2023)   Received from [facility]   Overall Financial Resource Strain (CARDIA)    Difficulty of Paying Living Expenses: Not very hard  Food Insecurity: No Food Insecurity (11/08/2023)   Received from [facility]   Hunger Vital Sign    Within the past 12 months, you worried that your food would run out before you got the money to buy more.: Never true    Within the past 12 months, the food you bought just didn't last and you didn't have money to get more.: Never true  Transportation Needs: No Transportation Needs (11/08/2023)   Received from [facility] - Transportation    In the past 12 months, has lack of transportation kept you from medical appointments or from getting medications?: No    Lack of Transportation (Non-Medical): No  Physical Activity: Not on file  Stress: Stress Concern Present (08/12/2020)   Received from [facility] - Occupational Stress Questionnaire    Feeling of Stress : Very much  Social Connections: Unknown (09/01/2021)   Received from [facility]   Social Network    Social Network: Not on file  Intimate Partner Violence: Not At Risk (04/17/2023)   Humiliation, Afraid, Rape, and Kick questionnaire    Fear of Current or Ex-Partner: No    Emotionally Abused: No    Physically Abused: No    Sexually Abused: No    Past Medical History, Surgical history, Social history, and Family history were reviewed and updated as appropriate.   Please see review of systems for further details on the patient's review from today.   Objective:   Physical Exam:  BP  112/72   Pulse 85   Physical Exam Constitutional:      General: He is not in acute distress.    Appearance: He is well-developed.  Musculoskeletal:        General: No deformity.  Neurological:     Mental Status: He is  alert and oriented to person, place, and time.     Cranial Nerves: No dysarthria.     Motor: Tremor present.     Coordination: Coordination normal.     Comments: mild tremor  Psychiatric:        Attention and Perception: Attention and perception normal. He does not perceive auditory or visual hallucinations.        Mood and Affect: Mood is anxious and depressed. Affect is not labile, blunt, angry or tearful.        Speech: Speech normal. Speech is not slurred.        Behavior: Behavior normal. Behavior is not agitated or slowed. Behavior is cooperative.        Thought Content: Thought content is not delusional. Thought content does not include homicidal or suicidal ideation. Thought content does not include suicidal plan.        Cognition and Memory: Cognition normal. He exhibits impaired recent memory.        Judgment: Judgment normal.     Comments: Insight fair Chronically talkative .directable. But chronically mildly pressured but less so and calmer than usual. He is chronically anxious  .  No manic signs noted. Anhedonia  Intrusive obsessions episodic HI/SI are better since this year but not gone and do cycle Alert and oriented.      Lab Review:     Component Value Date/Time   NA 144 04/17/2023 0314   NA 135 10/20/2022 1123   NA 142 03/20/2014 0514   K 4.4 04/17/2023 0314   K 4.1 03/20/2014 0514   CL 109 04/17/2023 0314   CL 112 (H) 03/20/2014 0514   CO2 28 04/17/2023 0314   CO2 26 03/20/2014 0514   GLUCOSE 92 04/17/2023 0314   GLUCOSE 99 03/20/2014 0514   BUN 16 04/17/2023 0314   BUN 22 10/20/2022 1123   BUN 7 03/20/2014 0514   CREATININE 1.43 (H) 04/17/2023 0314   CREATININE 0.78 03/20/2014 0514   CALCIUM  8.7 (L) 04/17/2023 0314   CALCIUM  9.5  03/20/2014 0514   PROT 6.5 04/14/2023 1047   PROT 7.3 03/18/2014 1459   ALBUMIN 3.8 04/14/2023 1047   ALBUMIN 3.2 (L) 03/18/2014 1459   AST 21 04/14/2023 1047   AST 16 03/18/2014 1459   ALT 10 04/14/2023 1047   ALT 16 03/18/2014 1459   ALKPHOS 39 04/14/2023 1047   ALKPHOS 105 03/18/2014 1459   BILITOT 0.3 04/14/2023 1047   BILITOT 0.3 03/18/2014 1459   GFRNONAA 56 (L) 04/17/2023 0314   GFRNONAA >60 03/20/2014 0514   GFRAA 54 (L) 04/18/2019 1612   GFRAA >60 03/20/2014 0514       Component Value Date/Time   WBC 4.2 01/11/2024 1031   WBC 6.5 04/21/2023 0946   RBC 4.08 (L) 01/11/2024 1031   RBC 3.92 (L) 04/21/2023 0946   HGB 12.6 (L) 01/11/2024 1031   HCT 38.3 01/11/2024 1031   PLT 396 01/11/2024 1031   MCV 94 01/11/2024 1031   MCV 89 03/20/2014 0514   MCH 30.9 01/11/2024 1031   MCH 30.9 04/21/2023 0946   MCHC 32.9 01/11/2024 1031   MCHC 32.7 04/21/2023 0946   RDW 12.1 01/11/2024 1031   RDW 12.5 03/20/2014 0514   LYMPHSABS 2.0 01/11/2024 1031   LYMPHSABS 3.2 03/20/2014 0514   MONOABS 0.4 04/21/2023 0946   MONOABS 1.0 03/20/2014 0514   EOSABS 0.4 01/11/2024 1031   EOSABS 0.4 03/20/2014 0514   BASOSABS 0.1 01/11/2024 1031   BASOSABS 0.1 03/20/2014  9485    Lithium  Lvl  Date Value Ref Range Status  10/13/2023 0.2 (L) 0.5 - 1.2 mmol/L Final    Comment:    A concentration of 0.5-0.8 mmol/L is advised for long-term use; concentrations of up to 1.2 mmol/L may be necessary during acute treatment.                                  Detection Limit = 0.1                           <0.1 indicates None Detected    08/12/21 lithium  level 0.9 on 450 mg.  lithium  level Sept 0.8.   lithium  level July 27, 2018 was normal at 1.0.   Lithium  level [facility] October 03, 2018 = 1.2. Said he got lithium  level at [facility] as requested.  Labs not in Epic.  Recent lipids ok except higher TG than usual.  Normal A1C.  11/10/22 Checked clozapine  level on 400 mg HS= cloz 733+norclozapine 294 =  total 1027  06/07/23  Vitamin B12 >300 pg/mL 848  On oral supplement  Assessment: Plan:    Besnik was seen today for follow-up, depression and anxiety.  Diagnoses and all orders for this visit:  Severe bipolar I disorder, current or most recent episode depressed (HCC)  GAD (generalized anxiety disorder)  Panic disorder with agoraphobia  Insomnia due to mental condition  Other obsessive-compulsive disorders  Mild cognitive impairment    60 min appt with pt  needs a lot of time   .   Seen alone and then with parents for additional info.  Pt is fair historian.  Chronic TR bipolar mixed and chronic anxiety.  He usually has mixed bipolar symptoms which we have not been able to completely eliminate.  See long list of meds tried.   He is  chronically unstable .  Mood is relatively stable except for the severe intrusive, ego-dystonic persistent obsessive HI/SI without intent or plan.  No better no worse.  Residual anhedonia. Hx chronic intrusive ego dystonic thoughts HI/SI.  More like obsessions than true HI/SI.   No recent mania.  Chronic dep and anxiety.  But is a little better since being on Keppra .   Psych [name] 08/02/22 with SI and acute kidney injury and elevated lithium . Processed this and disc goal of trying to stop lithium  but risk of worsening SI.  He has chronic SI and there's risk of this worsening off lithium .  Lithium  and clozapine  are the most effective meds for SI.    [name] for SZ episodes.  Fluvox stopped and clozapine  reduced to 300 mg HS.   Was RX risperidone  but he stopped that after DC.  It was ineffective in at least 2 trials.  We discussed the short-term risks associated with benzodiazepines including sedation and increased fall risk among others.  Discussed long-term side effect risk including dependence, potential withdrawal symptoms, and the potential eventual dose-related risk of dementia.  But recent studies from 2020 dispute this association between benzodiazepines  and dementia risk. Newer studies in 2020 do not support an association with dementia.  Unfortunately due to the severity of set his symptoms polypharmacy is a necessity.    Lithium  being used bc chronic SI and death thoughts.  Counseled patient regarding potential benefits, risks, and side effects of lithium  to include potential risk of lithium  affecting thyroid  and renal function.  Discussed need for periodic lab monitoring to determine drug level and to assess for potential adverse effects.  Counseled patient regarding signs and symptoms of lithium  toxicity and advised that they notify office immediately or seek urgent medical attention if experiencing these signs and symptoms.  Patient advised to contact office with any questions or concerns.  Reduced lithium  to 150 mg daily Dt hx toxicity Checked lithium  level and BMP and level 1.1 on 300 mg Daily so reduced again to 150 mg daily and level 0.4 2024-05-13 Call if death thoughts worsen or worsening SI.  Disc clozapine  and it's level..  Disc data showing less SI with it but he's had no changes.    Disc risk in detail including low WBC with complication, myocarditis.  Extensive discussion of CBC monitoring.  Disc going higher despite level and checking ECG bc cardiac risk at higher doses.   Disc this again bc taking Wegovy might help him to tolerate it better.   clozapine  600 mg HS was Started 12/07/22.  Started clozapine  08/05/22. 11/10/22 Checked clozapine  level on 400 mg HS= cloz 733+norclozapine 294 = total 1027 Reduced to 300 mg HS at the hospital. CBC every 12 weeks, now that on it over a year.  And DT recent FDA changes in recommendation.  Use the atropine  drops prn 2-4 on each side.  It's managed.  Did he take duloxetine?  Might help depression more.   Consider retry pramipexole off label but tricky with clozapine .   Consider Qelbree.   Sent note to Dr. 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   Switched lorazepam  to  clonazepam  0.5 mg BID  working better and more calming and lasts longer. M administers  We discussed the short-term risks associated with benzodiazepines including sedation and increased fall risk among others.  Discussed long-term side effect risk including dependence, potential withdrawal symptoms, and the potential eventual dose-related risk of dementia.  But recent studies from 2020 dispute this association between benzodiazepines and dementia risk. Newer studies in 2020 do not support an association with dementia.  Discussed safety plan at length with patient.  Advised patient to contact office with any worsening signs and symptoms.  Instructed patient to go to the [facility] emergency room for evaluation if experiencing any acute safety concerns, to include suicidal intent.  He commits to safety. His HI/SI that he complains about are intrusive ego-dystonic obsessions and not true HI SI in that there's no desire or intent ever.  Off GLP-1.  Has had weight gain from meds as a contributor. Lost 45# with Wegovy and 150#.    Needs to keep up activity as much as possible for depression.  For severe constipation seeing GI  Historically he has not done well with serotonergic medicines which would typically be used for obsessive thoughts.  But we are running out of options.   Repeat ECT not good option bc SZ for TR sx.   Disc memory concerns and mother's memory concerns bc had it before.  Disc counseling.  [email or web address] for therapist.  Most effective therapist will need to be expert in CBT.  He has one now.  No effect with Fanapt  4 mg nightly.   Fanapt  Increase to 8 mg nightly for 2 weeks.   then if well tolerated but no benefit increase to  12 mg nightly.   Disc SE  FU 4 weeks   Lorene Macintosh, MD, DFAPA  Future Appointments  Date Time Provider Department Center  03/21/2024  1:30 PM  Cottle, Lorene KANDICE Raddle., MD [facility] None  04/18/2024  1:00 PM Cottle, Lorene KANDICE Raddle., MD [facility] None  05/16/2024   1:00 PM Cottle, Lorene KANDICE Raddle., MD [facility] None  12/26/2024  2:30 PM Francisca Redell BROCKS, MD [facility] None    No orders of the defined types were placed in this encounter.      -------------------------------

## 2024-02-15 NOTE — Patient Instructions (Addendum)
 Fanapt  Increase to 8 mg nightly for 2 weeks.   then if well tolerated but no benefit increase to  12 mg nightly.   Give Dr. Maree my note questioning whether we could try lamotrigine again.  Need his permission bc of your history of seizure.

## 2024-03-05 ENCOUNTER — Telehealth: Payer: Self-pay | Admitting: Psychiatry

## 2024-03-05 NOTE — Telephone Encounter (Signed)
 Pt states he is awaiting a call from [facility] about a script for Fanapt .

## 2024-03-05 NOTE — Telephone Encounter (Signed)
 He wants to know about PA for Fanapt  12 mg.

## 2024-03-07 ENCOUNTER — Telehealth: Payer: Self-pay | Admitting: Psychiatry

## 2024-03-07 MED ORDER — ILOPERIDONE 12 MG PO TABS: 1.0000 | ORAL_TABLET | Freq: Every evening | ORAL | 0 refills | Status: DC

## 2024-03-07 NOTE — Telephone Encounter (Signed)
 Patient was told earlier today Rx was sent to Dr. Geoffry today.

## 2024-03-07 NOTE — Telephone Encounter (Signed)
 Pt lvm 4:55 pm 11/3. Need Rx for 12 mg Fanapt . Will be out of 8 mg soon. Rtc [phone]

## 2024-03-07 NOTE — Telephone Encounter (Signed)
 I did not see Fanapt  on his medication list unless I overlooked it.

## 2024-03-07 NOTE — Telephone Encounter (Signed)
 [employer] reports PA already on file for Fanapt  02/01/24-05/02/25

## 2024-03-07 NOTE — Telephone Encounter (Signed)
 Pt said she called pharmacy and they dont have his prescription for 12mg  fanapt . Req CB

## 2024-03-09 ENCOUNTER — Telehealth: Payer: Self-pay | Admitting: Psychiatry

## 2024-03-09 NOTE — Telephone Encounter (Signed)
 Pt Lvm @ 3:30p stating he hasn't gotten his 12mg  of Iloperidone ?.   His pharmacy doesn't have it.  He wants a call back today to know if it's ok if he doesn't have it for the next few days.  Next appt 11/19

## 2024-03-09 NOTE — Telephone Encounter (Signed)
 Patient informed about Fanapt  and that it has been ordered, was promised on 11/10, but had not been updated today and could arrive earlier. He has two 6 mg tablets left from the sample packet. He reported he would take one of them for the next two nights. Told him no other options at this time and that he should not have significant withdrawal from missing a couple of days.

## 2024-03-09 NOTE — Telephone Encounter (Signed)
 [facility] said they are not able to get Fanapt  and they transferred it to [facility] on [address], [place]. Called [facility] and they are having to get from a special pharmacy. Promise date is 11/10, but they haven't received an update yet today. Hardeep is worried about going without.

## 2024-03-09 NOTE — Telephone Encounter (Signed)
 It should not cause sig withdrawal to be without it a couple of days.  I dont'have another solution

## 2024-03-15 ENCOUNTER — Other Ambulatory Visit: Payer: Self-pay | Admitting: Psychiatry

## 2024-03-15 DIAGNOSIS — F411 Generalized anxiety disorder: Secondary | ICD-10-CM

## 2024-03-15 DIAGNOSIS — F314 Bipolar disorder, current episode depressed, severe, without psychotic features: Secondary | ICD-10-CM

## 2024-03-15 DIAGNOSIS — F428 Other obsessive-compulsive disorder: Secondary | ICD-10-CM

## 2024-03-15 DIAGNOSIS — F4001 Agoraphobia with panic disorder: Secondary | ICD-10-CM

## 2024-03-21 ENCOUNTER — Ambulatory Visit: Admitting: Psychiatry

## 2024-03-21 ENCOUNTER — Encounter: Payer: Self-pay | Admitting: Psychiatry

## 2024-03-21 DIAGNOSIS — F411 Generalized anxiety disorder: Secondary | ICD-10-CM

## 2024-03-21 DIAGNOSIS — F5105 Insomnia due to other mental disorder: Secondary | ICD-10-CM

## 2024-03-21 DIAGNOSIS — F314 Bipolar disorder, current episode depressed, severe, without psychotic features: Secondary | ICD-10-CM

## 2024-03-21 DIAGNOSIS — F428 Other obsessive-compulsive disorder: Secondary | ICD-10-CM

## 2024-03-21 DIAGNOSIS — F4001 Agoraphobia with panic disorder: Secondary | ICD-10-CM

## 2024-03-21 DIAGNOSIS — G3184 Mild cognitive impairment, so stated: Secondary | ICD-10-CM

## 2024-03-21 NOTE — Progress Notes (Signed)
 Albert [name] [id number] 03/01/1966 58 y.o.    Subjective:   Patient ID:  Albert [name] is a 58 y.o. (DOB [birth year]) male.  Chief Complaint:  Chief Complaint  Patient presents with   Follow-up   Depression   Anxiety   Medication Reaction      Albert [name] presents to the office today for follow-up of mixed bipolar, anxiety and still grieving stress of breakup.  He requires frequent follow-up because of long-term symptoms.  At visit October 25, 2018.  We made several medicine changes because of his concerns about sleep and other issues.  We increased olanzapine  back to 7.5 mg bc not sleeping as well at 5 mg.  He has to have the prescription for quetiapine  written for up to 3 tablets at night in case insomnia is worse because insomnia makes his mood disorder so much worse.  We discussed that was above the usual max but the request was granted given his treatment resistant status. We also reduce lithium  from 900 mg daily to 750 mg daily to try to reduce tremor and muscle twitches.  At  visit March 28, 2019.  Fluoxetine  was increased to 40 mg daily.   Early January 2021 increased to 60 mg daily.  No SE.  seen June 28, 2019.  No further meds were changed except olanzapine  was increased back to 10 mg daily to see if if he could get additional benefit per his request.  Covid vaccinated.  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  Then had cholecystectomy.  April 2021 appt with the following noted: 2 episodes night sweats lately.  Memory has been very bad lately.  Repeatedly asks mother questions. Still  tend to stay in his room and his bed.  Still has rapid cycling mood swings.  Maybe some better with increase in olanzapine  to 10 and tolerating itl.  Would like to get out more but can't DT Covid.  Can perform necessary chores.  Will get out of the house when he can.  No change in death thoughts and anxiety in intensity but is better with frequency.  Usually comes and goes in waves but more persistent.   Consistent with meds.  Ativan  not helping anxiety very dramatically but he's not sure.  Fidgety.  No trigger other than still grieving relationship and can't get it out of his head.  Poor energy, concentration and more forgetful. In bed more and less active.  Sleep ok lately which is unusual.  Can concentrate on financial matters and stock market at times.  Tolerating meds.  Still some intrusive SI without reason. Less frequent obsessive thoughts about broken relationship and has been doing this for months.  Can't let it go.  Loop.   No unusual stress even with the family who is supportive. Likes the benefit that Zyprexa  gives. No sleepwalking nor falling nor odd behavior.  No history of sleep walking.  He understands this may recur with an increase.  Also wants option to rarely take extra quetiapine  300 for sleep prn. Plan:  Try to reduce lorazepam  if possible.  10/22/2019 appointment with the following noted: Continues fluoxetine  60, lithium  600 mg, lorazepam  2 mg AM and HS and 1 mg midday, olanzapine  10, quetiapine  600 mg HS. Still anxious chronically and including driving in crowded spaces.  Doesn't thing Ativan  helps as well as Xanax  but less cognitive problems. Sleep is not as good.  Occ EFA.  More EMA and wanting to do things in the middle of  the night but this is not typical.  Wonders about why that happens.  Some napping.   Tolerating meds.  Asks about weight loss meds.  Disc this in detail.   Plan no changes except OK meclizine  prn vertigo.  02/11/20 appt with the following noted: Able to gradually reduce lorazepam  to 1 mg AM and HS. More depressed over time and less interested in things and less interest in going out but does with his parents. Has reduced from 800 to 600 mg HS with some awakening but usually able to go back to sleep.[name]  Spending a good amount of time in bed bc watches TV in bedroom.  Parents watch TV in different part of the house.  No mood swings he notices.   Concerns  about weight gain about 200#. Likes olanzapine 's benefit for sleep. Plan: Trial for TRD to  Increase fluoxetine  to 80 mg daily  to use the combo with olanzapine  for TR bipolar depression.   04/21/20 appt with following noted: I thought in beginning some benefit with fluoxetine .  Lifelong negative thinking continues.   Not much bipolar.  Reads a lot on bipolar.   Still tired and anxious a little more may be seasonal.  No familial stressors.  Tends to lay down in afternoon.  Anxiety not over anything in particular.   No SE with fluoxetine . Took meclizine  prn.  Easily motion sick.    Asks questions about newer drugs for bipolar like Caplyta . Plan:  No med changes  06/30/20 appt noted: Tired of wearing masks.  Vaccinated.  Asked questions about when this will end with mask mandate.  Mood up and down some since here and getting up 2-3 times.  Disc awakening problems.  Trying to do better bc night eating some.   Mood is about average today so far.  [name] out with friends for breakfast.  Recognizes activity helps mood.   3 days in a row napped a lot in the afternoon.   Bed is a comfort place.  Gets bored and hard to motivate.  Tries to stay away from night eating and spending.   More depressed when alone and wonders about med changes. Plan: Failed response to olanzapine  + fluoxetine  for TR bipolar depression.   Reduce fluoxetine  to 1 daily and reduce olanzapine  to one half nightly for 10 days and then stop it. Then start Caplyta  1 daily  08/27/2020 appointment with the following noted: Patient decompensated with the above switch to Caplyta  with intrusive suicidal thoughts and had to be hospitalized for psychiatric reasons.  Multiple phone calls with family members since that time.  Spoke with the psychiatric nurse practitioner with the decision to restart olanzapine  in place of the Caplyta  given the patient was more stable while on the combination of Seroquel  and olanzapine  than with the  Caplyta . Caplyta  triggered HI/SI and depressed and confused on it, and agitated and still has some of it now. No akathisia. Frustrated with lack of therapy at the hospital.   Hydroxyzine  didn't help jitteriness.  Feels some better.  Fleeting SI & HI but not obsessive like it was prior to hospitalization.  Feels a little amped up and nervous.  Scared of what could happen.  Apprehensive being here talking about things. 5-6 hours sleep last night and would like to have more. At mom and dad's house right now and up more in the day. Inadvertently stopped lorazepam  abruptly by mistake likely contributing to shakiness. Still some memory issues.  Trying to stay out of  bed when watching TV.  Some awakening but managing. Occ fleeting SI and distracts himself.   Always spends a lot of time just laying around.   Can have anxiety for no reason like coming here, and varies in intensity without pattern.      Less panic than in the past.  Some chronic depression and hyperactivity and loudness and hyperverbal.  Usually back to sleep.  Total 6 hours but some napping, awakens 2-4 times nightly but back to sleep..  Taking quetiapine  600 mg HS.  still lacks interest and motivation.  Can follow a TV show if interested. Plan: Restart lorazepam  1 mg in the morning, 1 mg in the afternoon and 1 mg at night for anxiety Stop hydroxyzine   Increase olanzapine  to 1 and 1/2 of 10 mg tablets in evening  09/05/2020 appt noted: seen with parents today at his request Made med changes noted and tolerated the changes Better than I did last week. Now only fleeting HI/SI and no where near what it was.  Sleeping better.  Still not a lot of energy.  Anhedonia.  Less agitation.  A lot of anxiety all the time but it's helping.  Would like more energy and motivation.   Doesn't handle stress well. Parents note he's less shakey.  He's still staying with his parents.  Only driven once since this happened and F wants him to be able to drive and  function more independently.  M agrees he's better.  Memory is better but not normal.  Primarily STM problems No akathisia with olanzapine  this time.    Administering his own meds but mo watched. Slept 8 hours last night. Plan consider further reductions in quetiapine   10/17/20 appt noted: Stayed on Seroquel  600 HS and olanzapine  15. Olanzapine  seemed to do most for the sleep.  Also helping eliminated HI/SI for the most part.  Wants to try to increase it.  Depression is much better with less rumination also.  Fears akathisia at higher dose as in the past. Also on fluoxetine  40.   Plan: Reduce Seroquel  to 1 and 1/2 tablets at night Increase olanzapine  to 1 of the 15 mg tablet and 1/2 of the 5 mg tablet for 1 week,  Then, if tolerated increase the olanzapine  to 20 mg or 1 of the 15 mg tablets and 1 of the 5 mg tablets. If possible then also reduce quetiapine  to 1 tablet at night.  11/05/2020 appointment with the following noted: Up to olanzapine  20 mg hs for 3 days.  Reduced Serooquel to 450 mg HS.  Didn't try to go lower. Had hiatal hernia and umbilical hernia surgery recently with no problem.   No akathisia so far. Sleep ok with meds so far changes.  No mood changes yet but overall likes the smoothness of olanzapine  and helping sleep without knociing him out. Still some occ HI/SI, he thinks bc of anxiety generally.  Easily anxious. Plan: Continue olanzapine  20 mg nightly longer to give it time to help mood sx reduce quetiapine  to 1 tablet at night to minimize polypharmacy and reduce risk of akathisia.  01/07/21 appt noted: Able to reduce quetiapine  300 mg HS ok with decent sleep but still wakes a lot.  No AM hangover.  Don't have a lot to do.  Can enjoy going out and doing things.  But doesn't do it longterm.  Family is doing good.  Father started year 73 at his job.   Tolerating the meds well.  May wake more with quetiapine   reduction. Lost 10#. Wonders if could try other meds he didn't  tolerate before bc now can tolerate olanzapine  and couldn't tolerate it in the past DT akathisia. Mood never great but can swing into depresssion without reason.   Plan: Continue olanzapine  20 mg nightly longer to give it time to help mood sx reduce quetiapine  to 1/2 of the 300 mg  tablet at night to minimize polypharmacy and reduce risk of akathisia.  02/02/2021 appointment with the following noted: Tried reduced quetiapine  to 150 mg and after a week had withdrawal sx including jittery and SI and he increased to 250 mg daily.  Felt better after increasing to 300 mg daily and last week reduced to 250 mg daily.  After increase quetiapine  felt better in a few days.  Seems more alert with less quetiapine  but can't tolerate a quick withdrawal.   Some awakening.  Plan: Reduce quetiapine  by 50 mg every 2-4 weeks.  03/09/2021 phone call: Pt called and said that he is weaning off the seroquel . He was down to 200 mg of the seoquel and he started having withdrawal symptoms and sucidal thoughts. He went back up to 250 mg because he knew he was good on that.    Pt stated he starting back taking 250 mg on Saturday due to the symptoms he was having.Now he is no longer having suicidal thoughts but still very jittery. MD: He may just need to taper slower than most people.  If he can tolerate the 250 mg Seroquel  with the jitteriness it should get better over the next 1 to 2 weeks.  Then he can try going down again by 50 mg at a time.  He probably needs to go down by just 50 mg every 2 to 4 weeks and wait till he fully adjusts to each reduction before he reduces again.  03/17/21 appt noted: Hernia surgery 2 weeks ago. Seroquel  250 mg for a month then 200 mg daily and had SI and increased again to 300 mg daily.  Would love to get off it.  Withdrawal triggers intrusive HI/SI but otherwise doesn't have them No SE. Sleep is OK but not enough REM sleep.  Satisfied with other meds.   No current HI/SI.  No paranoia.  Still  has anxiety and taking Ativan  every 6 hours.  It works but doesn't cure it. Chronic anxiety and ask about ketamine for chronic depression. Plan: Because he had WD reducing from 250 to 200 mg daily will have to go slowly. Reduce quetiapine  by 25 mg every 2-4 weeks. Reduce Seroquel  to 250 mg for 2 weeks, Then reduce to 225 mg for 2 weeks, Then reduce to 200 mg for 2 weeks, Then reduce to 175 mg for 2 weeks, Then reduce to 150 mg Also: Buspirone  30 mg tablets for anxiety Start 1/3 tablet twice daily for 1 week Then increase to two thirds twice daily for 1 week Then increase to 1 tablet twice daily  04/15/2021 appointment with the following noted: Reduced Seroquel  to 200 mg HS and increased buspirone  to 30 mg BID STM px and wants to try to reduce Ativan  to see if it is better.  But fears with drawal sx. Parents complain about his memory. Less HI and SI since here. Less depressed. Plan: Buspirone  30 mg tablets for anxiety Start 1/3 tablet twice daily for 1 week Then increase to two thirds twice daily for 1 week Then increase to 1 tablet twice daily Continue olanzapine  20 mg nightly longer to give  it time to help mood sx Because he had WD reducing from 250 to 200 mg daily will have to go slowly. Reduce quetiapine  by 25 mg every 2-4 weeks. Reduce Seroquel  to 250 mg for 2 weeks, Then reduce to 225 mg for 2 weeks, Then reduce to 200 mg for 2 weeks, Then reduce to 175 mg for 2 weeks, Then reduce to 150 mg Trial taper lorazepam : In hopes of improving short-term memory  04/30/2022 phone call: Complaining of withdrawal symptoms reducing lorazepam  from 0.5 mg 5 times a day to 0.5 mg 4 times daily.  05/21/2021 appointment with the following noted: Gotten onto buspar  30 BID and down on lorazepam  to 0.5 mg QID. Gotten down to Seroquel  200 mg HS A little restless every now and then but able to reduce lorazepam  for the last weeks. Is too inactive and lays in bed too much watching TV.   Can' t  tell effect of buspirone  bc of other changes.  Sleep with awakening with 6-7 hours. Fleeting SI without trigger or plan or intent. Depressed more than anxious. Enjoys watching stocks and investing. Plan: Trial taper lorazepam  gradually over time In hopes of improving short-term memory But for now continue lorazepam  0.5 mg QID Start clonidine  0.1 mg tablets one half at night for 3 nights, then 1/2 tablet twice daily for 3 nights, then one half in the morning and 1 tablet at night If clonidine  does not noticeably help anxiety call the office  05/27/2021 phone call initially complaining of homicidal and suicidal thoughts after taking 1 mg of clonidine .  He was told to stop the medication.  But then called back stating he was having homicidal and suicidal thoughts from withdrawal from the medication which was not logical.  He agreed it was not logical and stated on 05/29/2021 that he felt better  06/23/2021 appointment the following noted: No clonidine . Sunday panic attack eating out and then having SI.  Stayed with father to feel safer. Intrusive thoughts of hurting parents or others.  HI only when has SI.  Usually panic without SI.  Then had intrusive thoughts about hurting others.   Usually intrusive thoughts are brief and fleeting.    07/20/21  appt noted; More anxious with less lithium .  More anxious in his body.  Not more depressed or irritable.  Sleep is the same.  More racing thoughts.  Chronic SI probably a litte worse but not unmanageable. Rduced lithium  last week to 300 mg daily. No recent change in lisinopril . Off the clonidine  for a week or so. Doesn't drink much water.  Drinks a lot of diet coke. No marked akathisia or RLS but does shake his leg DT anxiety. Worries about needing mor emedicine. Plan: Increase olanzapine  to 25 mg nightly bc more racing thoughts and anxiety with less lithium  DT severity of sx and difficulty getting off Seroquel  conside rincrease.  07/30/21 TC : CO  racing SI today and wonders about whether increased sx racing thoughts, hyperactivity, SI related to Reduced lithium  from 600 mg daily to 300 mg daily recently DT lithium  level 1.8.  could be related.    Plan: increase olanzapine  30 mg pm Today take lithium  600 mg now.  Tomorrow start lithium  CR 450 mg daily Continue olanzapine  30 mg PM until SI resolves unless akathisia returns.  Lorene Macintosh, MD, DFAPA  08/17/21 appt noted:  No akathisia with olanzapine  30 mg pm (about 3 weeks) and is sleeping better. Increased lithium  to 450 mg daily. Tolerating meds. Still racing  negative thoughts maybe some better.  Asked about it. SI come and go.   Anxiety about the same as depression.  No motivation unless has to do something. Plan: Reduce  fluoxetine  20 mg 1 daily in hopes racing thoughts and anxiety are better.  09/14/2021 appointment with the following noted: No difference noted with reduction fluoxetine  20 mg daily. Most of the time negative thoughts, ex what if I get in a wreck?  Frequent negative thoughts.   Still on lorazepam  0.5 mg TID.   Wonders about trying prior meds that caused akathisia bc no longer gets it with olanzapine  30 mg daily. Sleep is usually ok.  Taking Seroquel  200 mg HS.  Thinks the olanzapine  helps his sleep and doesn't want to stop it. Panic 1-2 times per month.  Then takes extra lorazepam . Plan: DC fluoxetine  20 mg 1 daily  Trial Auvelity off label for depression 1 in the AM for 1 week then 1 in AM and 1 with evening meal  10/15/2021 appointment with the following noted: Occ of getting confused as to day and went to church on a Thursday my mistake.   Had it happen a few months ago too.  Memory is terrible. Thinks he couldn't tolerate more Auvelity. Not a lot of benefit from [place] 1 daily. Still quite a bit of anxiety.   A little drowsy  and sleeps 7-8 hours at night. Word finding problems.  Lost wallet. Plan: DC  Auvelity  For [facility]: memantine  1/2 tablet in the  AM for 1 week, then 1/2 tablet twice daily, then 1/2 tablet in the AM and 1 tablet at night for 1 week then 1 tablet twice daily  10/22/2021 phone call complaining of additional stress asking to start new medication because of anxiety and more suicidal thoughts without intent or plan.  Reports taking more than 4 prescribed Ativan  a day recently.  Asking to increase olanzapine  which is already at 30 mg daily. MD response:We cannot go any higher in the dosage of olanzapine  that he is currently taking.  A lot of these suicidal thoughts are apparently anxiety driven.  Let us  have him resume taking paroxetine  which is one of the more effective medicines for anxiety.  I will send it in and the instructions will be paroxetine  20 mg tablets 1/2 tablet daily for 1 week then 1 tablet daily  11/16/21 appt noted: Multiple phone calls since she was here.  Complaining of suicidal thoughts from memantine  which was stopped. Started paroxetine  and up to 20 mg daily.  Continues olanzapine  30 mg, lithium  450 mg nightly, lorazepam  0.5 mg 4 times daily , And quetiapine  200 mg nightly. Questions about whether memantine  caused the neg SI really bc has them at times. When has SI often anxiety driven.  Will tend to ruminate on past relationship ended.   Edgy and irritable all the time but memantine  seemed to make it worse. Mind is getting better with less SI lately.   No SE with paroxetine .   Aggrivated by forgetfulness.  eXample, talked with someone about going to breakfast last night and then forgot to go this mornin.g. Plan prescribed donepezil  5 mg daily for cognitive complaints specifically forgetfulness  12/31/2021 appointment with the following noted: He had been prescribed donepezil  5 mg for cognitive complaints.  He called back reporting he felt it was causing homicidal thoughts and was instructed to stop it.  He has had these types of thoughts in the past.  He had no desire to act on  them. Discouraged. Ongoing  depression and reduced enjoyment and negative thoughts.  Can enjoy some things. A lot of anxiety chronically.  Trying to limit Ativan  to 5 of 0.5 mg daily.  Sometimes needs 2. Constipation for years.  02/03/22 appt noted: Depression makes LBP worse. Ongoing chronic depression. Tolerated rivastigmine  without triggering SI Chronic anxiety and Ativan  helps some. Occ panic but not usual.  Asks if any other meds coud be used. Plan: But for now continue lorazepam  0.5 mg QID, but use LED for cog reasons Increase rivastigmine  to 3 mg capsule 1 twice daily or 2 of the 1.5 mg capsules twice daily.  Call if you have problems with nausea Start dextromethorphan  1 capsule twice daily . Thi sis off label for depression based on MOA of Auvelity and use of paroxetine  to prolong half life of DM. Presence of paroxetine  may have been reason he didn't tolerate Auvelity brief trial before.  Will proceed slowly.  03/17/2022 appointment noted: Current psych meds: Rivastigmine  3 mg twice daily, quetiapine  200 mg nightly, paroxetine  40 mg daily, olanzapine  30 mg nightly, Namenda  10 mg daily, lorazepam  0.5 mg 4 times daily, lithium  CR 450 mg nightly, dextromethorphan  15 mg twice daily A lot of anxiety since here.  Some days taken more lorazepam  to 6-7 tablets daily.  No major triggers but anxious even about coming here and other normal things.  Cancelled trip to collect rocks with friend and cancelled the trip bc anxious.  Usually if can go he is ok.  Lots of SI ongoing Memory might be a little better. Asks how he responded to Vraylar.  Will check paper chart.  03/24/22 TC:  CO SI worse and restilless with Rivastigmine  9 mg . MD response:  Stop the rivastigmine  since the [place] appears to be having side effects from it.  He was having anxiety at the last appointment 2 and then we increased the dose.  This might be causing the anxiety to be worse. He has chronic suicidal thoughts without intent or plan.  However it appears  the thoughts are more intense with the rivastigmine      05/06/21 appt noted: Stopped rivastigmine  DT anxiety and worsening SI.  Was better the next day. This week had some SI.  Thinking about the prior relationship tends to lead to SI. Will take Ativan  when gets SI and it helps a little at 0.5 mg at a time. Plan: Consider fluvoxamine .  Yes this may help with obsessions on old GF and he failed other optioons. Start fluvoxamine  1/2 of 50 mg tablet and reduce paroxetine  to 1 and 1/2 of 20 mg tablets for 5 days, Then increase fluvoxamine  to 1 tablet daily and reduce paroxetine  to 1 tablet daily for 5 days, Then increase fluvoxamine  to 1-1/2 tablets daily and reduce paroxetine  to 1/2 tablet daily for 5 days, Then increase fluvoxamine  to 2 tablets daily and stop paroxetine    06/07/22 appt noted: Feels like the fluvoxamine  50 BID is too much.  On this a couple of weeks. Worries he might be worse with this with regard to SI.    He thinks it's worse with increase.   New neighbor who has a young boy. He worries that Georjean is triggering depression and intrusive SI/HI a couple of days later and lasts a little while. Has had anxiety turning into panic attacks.  This gets associated with depression and SI/HI.  These thoughts are intrusive and he doesn't want to act on them.  Can have HI even towards family  with whom he has no negative emotions.  Little things like a bump up in parking lot sets off his depression and anxiety and doesn't seem to handle things.   No sig akathisia.  In the past it's been continuous.   Awakens every 2-3 hours.  Usually goes back to sleep with meds. Has not had alcohol in 20 years and had some intrusive thoughts without drinking.   Plan: As soon as he feels comfortable increase fluvoxamine  to 50 mg AM and 100 mg PM for intrusive thoughts of HI/SI and obs on GF  07/06/22 appt noted: Increased fluvoxamine  to 150 mg daily and felt more agitated and some SI so reduced to 100 mg 4 days  ago and feels better so far.  Likes the fact it is good for anxiety and obs thoughts.  Wonders if there is a similar med.  Frustrated he can't get relief.  Some improvement in obs on exGF but not resolved. Continue other meds: lithium  CR 450 mg HS, lorazepam  0.5 mg QID, Memantine  10 BID, olanzapine  30 mg HS, quetiapine  200 mg HS Tingling and burning in legs at night but doesn't interfere with sleep. Got insurance to pay for [facility] and lost 13#.   No full panic lately. But some anxiety waves.   Still major problems with memory. Plan: Wean fluvoxamine  50 mg daily for a month then 1/2 daily for 2 weeks then stop it DT  NR.  Watch for more anxiety.  07/28/22 TC: Con Dawna DASEN, CMA  to Me     07/28/22  4:16 PM Note Mom called reporting patient has been confused, dizzy, and sluggish for the last 3 days. He is now staying with her. She had several questions in regards to this:   Lithium  level - she feels he needs one done, last result in Epic was 08/2021 that I see. She would like order sent to [facility] for tomorrow.   Patient is on Wegovy, has been for about a month, and she wonders if this could cause sx.   Patient has meclizine  and she is questioning if she should give that for his dizziness considering other sx.   She questions if perhaps he got up during the night and took additional medications, though doesn't know if this happened and if so what he took.    On 3/5 visit he was to wean fluvoxamine  to 50 mg qd for a month and then 1/2 tab for 2 weeks and then stop. Mom and pt unsure if he started the wean.    He has F/U 4/4.        Me  to Con Dawna DASEN, CMA   CC   07/28/22  5:44 PM Note Yes get lithium  level in the AM and don't take any lithium  tonight or in the morning before the blood test.     Georjean should not cause these sx except if he is losing wt quickly or is dehydrated it can cause lithium  toxicity and include these sx.  He needs to drink lots of water.   He should go  ahead and wean the fluvoxamine  as instructed in the last appt.   If the sx get worse, go to the ER.    Me   CC   07/29/22  6:49 PM Note RTC   Sx not as severe as when he had to go to hospital in the past.  [name] got Lithium  level today.   Speech is not good .  Confused at times,  but better today.  A little tremor .  A little balance problems.  No falls.  No diarrhea Took lihtium 450 last night. Mo says he got up in the middle of the night and moved things around last night and he doesn't rmember.   No change in night time meds.   Gradually reducing fluvoxamine .   No extra lorazepam .     Spoke with mother who verifies the same.   No med changes but push fluids.   Lorene Macintosh, MD, DFAPA      08/05/22 appt : with parents Pt hosp 08/02/22 with acute kidney injury and somewhat elevated lithium  level with some confusion.  Spoke with psych NP at hosp and lithium  was decreased to 450 mg HS. No pending appt known with medical doctors about kidney function. Don't feel good and having HI and SI and feels he needs lithium  for these issues.  Doesn't feel he should have been discharged.   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 No added meds at night but has been confused at night and getting up multiple times and doing things he doesn't remember.  This concerns parents.  Parents been up at night with him and he doesn't make sense.  Still groggy in the am per mo for an hour.   Plan: Plan: Get lithium  level and kidney test ASAP Stop fluvoxamine  Reduce lithium  300 mg capsule 1 at night Monday night: reduce to olanzapine  to 20 mg at night (stop the 10 mg tablet) and start clozapine  prescription. Monday 4/15 : increase clozapine  to 100 mg at night and reduce quetiapine  to 1/2 of 200 mg tablet and continue olanzapine  20 mg each evening.  08/01/22 & 08/11/22 ED for HI and SI  08/12/22 TC mother:  Mother, Patsy called. Per note made by Harlene Franchot PIETY. He was referred to [facility]Reporting that he did go up  there last night. This morning they called her and told her to come pick him up. They told her he wasn't having homicidal or suicidal thoughts and it would be four to five days of him waiting to go to a facility in [place]. They recommended him for PHP to start 4/16. She picked him up today. He is worried about these thoughts coming back up. Also, [facility] came out yesterday to assess him and offered counseling from [facility]. He wants to do counseling with them.  Any input on care he can receive?  He is so discouraged and wants to be safe.  (She notes that the [facility] gave him- Zyprexa  10mg  at 1:07am and again at 1:25am per the med list- based on his previous med list) He was supposed to be off of Zyprexa10mg . Has been on  Clozapine  since Monday. He did go ahead and take it before he left ( 2pills) so he hasn't missed a dose and did go get labwork done. Is wanting to continue the Clozapine , please check the labwork to be able to fill the 100mg  dose.    MD resp:  Continue all meds as RX except go ahead and increase clozapine  to 100 mg tablets, 1 each night as soon as he can get the 100 mg tablets from pharmacy.  RX sent.  Trying to speed up his chance of response by getting it to a hgiher dose.  Clozapine  is very effective for HI/SI.  If he gets worsening HI/SI in the interim it will not be caused by meds but just the natural waxing and waining of his sx.  Once clozapine  dose gets  high enough it should help but avg patient needs 300 mg so we have to just be patient.      08/20/22 appt:  Up to 100 mg clozapine  HS SE constipation and using Colace, Miralax .  Been to [facility] twice  since here.   Ongoing continuous HI/SI.   All I want to do is go to sleep to escape.  Dep causing the thoughts.   Taking Ativan  0.5 mg BID-QID.  He thinks mother not giving him enough of it.  He thinks anxiety triggers HI/SI.  He fears HI and hopes he's strong enough to reisist.  Not angry or wanting to hurt anyone in reality but has the intrusive  thoughts. Conc and memory still not good.   Sleep not much different.   Staying with parents currently. Dep gotten worse M worrying he's dependent on lorazepam .   Confusion at night is no longer happening.  Not wandering at night.  Plan: For severe constipation start Linzess  145 mg AM Get lithium  level and kidney test ASAP Stopped fluvoxamine  and confusion at night is gone  lithium  300 mg capsule 1 at night  olanzapine  to 20 mg at night  increase clozapine  to 150 mg at night for 3 nights then 200 mg HS   olanzapine  20 mg each evening. Continue lorazepam  0.5 mg QID, needs this and informed mother  09/07/22 appt noted: Psych meds: clozapine  250 MG HS, lithium  300 mg HS, lorazepam  0.5 mg BID-QID, olanzapine  20 .  Off Seroquel , linzess  290 mg daily, Miralax , Colace. Sleep 7-8 hours without change from before clozapine .   Mood is still very depressed and still gets anxiety. Still problems with constipation.   Dep is so much it is causing intrusive HI/SI.   No more bizarre behaviors at night.   Plan: Plan: Get lithium  level and kidney test ASAP  lithium  300 mg capsule 1 at night  olanzapine  to 20 mg at night  increase clozapine  to  300 mg HS as soon as tolerated and then 400 mg HS.  olanzapine  20 mg each evening. Continue lorazepam  0.5 mg QID, needs this and informed mother  10/07/22 appt noted: alone and with parents Rough month.  Altered voice for a couple of weeks or so..  Mind not good.  Feel out of sorts.  Jittery.  Constipation.  No appetite.  On Wegovy.  Saw GI doc.   May need to reduce Wegovy bc poor appetite and constipation.  Lost 25 #. Current psych med: clozapine  400 mg HS about a week or 2, olanzapine  20 mg HS, lithium  300 mg HS, lorazepam  0.5 mg QID. Reduced intrusive thoughts.  2 nights since here night eating.   Sleep 6 hours.  Limited napping during the day.   Anhedonia.  Lower motivation.  Tremor worse. M says he sleeps pretty well but awakens  BP is under  control. Plan Reduce lithium  to 150 mg capsule 1 at night DT level higher Reduce  olanzapine  to 10 mg at night  Continue clozapine  400 mg HS bc helping SI and HI and  Continue lorazepam  0.5 mg QID, needs this and informed mother  11/08/22 appt noted:  alone and with parents. Ongoing calls between visits Doesn't think clozapine  helping him sleep as well as olanzapine  for sleep or any other benefit. While at restaurant had intrusive HI/SI. Staying with parents seems to provoke intrusive HI.  Is grateful to them.  Sat woke up with them.  Sometimes goes away and sometimes not.  No know trigger and  no intent. Saw PCP.  Off Wegovy.  Started metformin . No effect noted from clozapine .   M limits his lorazepam .  PCP referring him to neuro to FU abnormal head scan.  He wonders if his speech and memory problems are related.   Plan: Cont clozapine  400 mg HS Check clozapine  level and then direct dosing.  11/16/22 TC from pt to disc level:  MD resp:  Clozapine  level ok but may benefit with increase to 500 mg nightly.  Tell him to increase with current supply.     12/06/22 TC from pt:  complaining of worsening intrusive ego-dystonic HI/SI without intent, desire or plan.   MD resp ok to increase clozapine  to 600 mg HS.  12/09/22 appt noted: Complaining of ongoing racing thoughts and worsening HI/SI without intent or desire or plan.  Racing thoughts chronic and other thoughts wax and wane.  Usually triggered if starts thinking of something negative Meds: clozapine  600 mg HS, lithium  150, lorazepam  0.5 mg QID, olanzapine  10 HS Sleep with EFA.   Will have trouble going to sleep sometimes with thoughts. Doesn't think clozapine  makes him sleepy.  Not really drowsy daytime. No dx OSA and no known apnea.  SE a little dizzy.  Constipation managed with BM every 2-3 days. Better than before.  PCP RX metformin  and it helped. Plan: Plan for intrusive HI/SI wean olanzapine  and trial of risperidone .  Reduce olanzapine   to 5 mg HS for 1 week then stop it.  Addressed his fears of not sleeping off olanzapine . Then if sleeping start risperidone .  01/11/23 appt noted:  also seen with parents Psych meds: clozapine  600, lorazepam  0.5 mg QID, No olanzpaine ,  lithium  150 mg daily, no risperidone  yet. Lost 45# on Wegovy down to 150#.   Still having intrusive HI/SI as noted before. Sleep no worse.  Takes a couple of hours after clozapine  to sleep.  EFA 2-3 times nocturia.   Still racing thoughts.  Ongoing px with conc, memory,  planning Scored 73 on 100 pt test for cognition.   Some trouble with articulation and speech at times.   If could get rid of racing and intrusive thoughts I'd be 100% better. Neuro says no stroke history and low B12 and workup ongoing at [facility], Dr. IZORA Fairly. Memory is still terrible.   Plan: Start risperidone  2 mg tablet, 1 at night for racing thoughts and intrusive homicidal and suicidal thoughts. Call in 3 weeks if not better and we will increase the dose  02/09/23 appt: seen also with parents , 60 min  Lost to 152#. Meds: as above, risperidone  incr to 4.5 mg HS for intrusive HI/SI SE drooling off an on.  Tremor mainly worse with a little more risperidone  HI/SI intrusive thoughts wax and wane.  Worse when lays down. Takes 2 hours to fall asleep.  Needs to get OOB when awakens.  6 hours sleep. No falls.   Would rather try to increase risperidone  than other options.    04/12/23 appt virtual:  seen alone CC:  Multiple phone calls about ongoing intrusive, obsessive, ego dystonic HI/SI thoughts without plan or intent. Didn't come to the office bc of these thoughts.  I know I don't want to do it.  Can last four hours.  Sees drug advertisements that talk about SI and that will trigger obsessions about it. Meds: clozapine  600 mg HS,  lithium  150 mg HS, lorazepam  0.5 mg TID, risperidone  increased to 8 mg and now back to 4mg  HS.  Neuro sched MRI tomorrow. Enough sleep 5-6 hours at night.  Not  napping.   Sometimes gets confused with med but not ususally.   No recent mania.   Depressed and anxious. Plan: AVS: Reduce risperidone  to 1/2 of 4 mg tablet in evening for 1 week then stop it. Reduce clozapine  to 5 tablets nightly Start fluvoxamine  100 mg tablet 1/2 tablet nightly for 1 week then 1 tablet nightly for obsessive, intrusive thoughts  05/25/23 appt in person:  not with parents, met with [facility] with 2 sz and then had psych hosp since here.  Another EEG 06/03/23.  Neuro DR.  Jhonny FATE Keppra .  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 Psych med: clozapine  reduced to 300 HS, stopped ? Risperidone  he's not sure, clonazepam  0.5 mg BID, lithium  150 HS, trazodone  50 prn rare.  Keppra  500 BID. Trazodone  good sleep med. SE drooling again.   Not sure any mental health effect of Keppra .   Denies any street drugs despite positive UDS for ecstasy. Overall still having intrusive HI/SI without intent or plan chronically but maybe is a bit better than before hosp stay. Sleep is better.  Lost 40# on Wegovy.  Plan no change  06/22/23 appt noted: EEG shows some epileptiform activity with FU pending with another 4 hour EEG pending. Meds unchanged Overall mental health about the same or a tad bit better.  Seems he can distract himself from intrusive thoughts and they are less common than they were.   CC mouth watering from med.  Choking middle of the night.  Atropine  helps some but has to be repeated and pain in the ass.   Sleep 6 hours.  Sometimes taking trazodone  50.  Sleep is not that bad.  Psych med: clozapine  reduced to 300 HS, , clonazepam  0.5 mg BID, lithium  150 HS, trazodone  50 prn rare.  Keppra  500 BID. Plan no changes  07/20/23 appt noted:  alone and with parents. Psych med: clozapine  reduced to 300 HS, , clonazepam  0.5 mg BID, lithium  150 HS, trazodone  50 prn rare.  Keppra  500 BID. B12 oral.  EEG sched for 08/2023; appt 08/25/23 Maybe a little better  but still same intrusive thoughts.  They cause him to have somatic anxiety like palpitations.  You know I don't want to do any of those thoughts.    Out of the blue.   SE still drooling.  Using drops some.  But not often. Anna Slayden therapist in [place].  Likes her.  She's good at teaching . Awakening 2-3 times night nocturia usually.  Back to sleep ok Parents think he is better with more interest. Seems less disturbed .  More outgoing and social and mother agrees. Switched lorazepam  to clonazepam  0.5 mg BID seems to be working better and more calming and lasts longer.  08/18/23 appt noted: Med: clozapine  reduced to 300 HS, , clonazepam  0.5 mg BID, lithium  150 HS, trazodone  50 prn rare.  Keppra  500 BID. B12 oral.  Appt next week with Dr. Loreli to review EEG. No med changes.  Still on above. SE drooling. No effect from atropine  2 drops . Mood not worse but not a whole lot better Anxiety still better with clonazepam  vs lorazepam . Sleep pretty good 5-6 hours and would like to sleep more.  Nocturia.   Not sedated from clozapine .  Rare naps. No other medical px except about EEG.   Low motivation bc doesn't feel like it.  So disgusted with lack of  response with meds.   Does limited chores.  Has cleaning lady.  At parent's house off and on. Go through the motions.  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  Just lay around.   Plan: No med changes.  Will wait to make med changes until after EEG work up completed.    09/19/23 appt noted:  disc plan with parents.  Med: clozapine  reduced to 300 HS, , clonazepam  0.5 mg BID, lithium  150 HS, trazodone  50 prn rare.  Keppra  500 BID. B12 oral.  Neuro  Dr Maree EEG spikes not likely to cause psych sx Frustrated with chronic psych sx dep.  Gets angry over the fact that no meds seem to help. Goes out to eat and enjoys food but doesn't feel like doing much.   No sig change in psych sx.  Chronic anxiety and dep.  Anxiety is better with clonazepam  than other meds.  Will  get triggered in loud places with agitation.  Low tolerance of stimulation. OCD therapist Anna Slayden , [place], said she couldn't help his chronic intrusive HI/SI.   Still has spells of intrusive HI/SI Plan no changes  10/24/23 appt noted:  parents here today Med: clozapine  reduced to 300 HS, , clonazepam  0.5 mg BID, lithium  150 HS, trazodone  50 prn occ.  Keppra  500 BID. B12 oral.  Atropine  drops at night.   No SZ in over 6 mos.  Not driving again yet. Trazodone  works when needed.  Likes it .   Some panic but less severe and frequent.  Not as bad.   SE no much except drooling ongoing.  Uses atropine  drops at night Don't like things I used to like but not really new.   Clozapine  good for sleep.  Still some intrusive thoughts but not as bad as they were. F 53 yr at [employer].  Generally dep is worse than anxiety.  Anhedonia and intrusive thoughts.  Plan: no changes  12/14/23 appt noted:  Med: clozapine   300 HS, , clonazepam  0.5 mg BID, lithium  150 HS, trazodone  50 prn occ.  Keppra  500 BID. B12 oral.  Atropine  drops at night.   Intrusive thoughts and SI gone down some.   Atropine  drops not working well enough so not using it.   Doesn't stop drooling at night.   SE drooling and constipation.  Seeing GI Chronic dep and anxiety .  Some ongoing anhedonia.  Would like more relief.  No SZ and no stroke like SX Trazodone  works when needed.  Some dep cycling without pattern or pcpt. M helps with meds.  BC is still forgetful  Plan: Retry Vraylar 1.5 mg every other day  12/23/23 TC Pt reports he took Vraylar 4 days, W-F-M-W and he started having SI on Tuesday.  He reports sx were less yesterday and a little better today, has not taken it today. He said you told him he shouldn't have SI with it. No other sx reported. The suicidal thoughts were no worse than what he has reported previously, but said he is pretty good at gaging his sx and was afraid to continue the medication. Reports  he feels so bad all the time and he is anxious to feel better. Has FU 9/17.  Took it MW for 2 weeks without akathisia, but then intrusive worsening of SI    MD resp: DC Vraylar  01/18/24 appt noted:  Med: clozapine   300 HS, , clonazepam  0.5 mg BID, lithium  150 HS, trazodone  50 prn occ.  Keppra  500 BID.  B12 oral.  Atropine  drops at night.   Foul mood with irritability.  SI better off Vraylar.  Down bc tried so many things that haven't worked.   Gets out and does things but doesn't really want to do it.  Not much to do during the week and just lies around.  Does not do many chores.   Anhedonia and ex not doing Ham radio as in past.   FU neurology in Oct and will disc memory concerns. Still feels need for mother to help with meds.  Plan: trial Fanapt .  02/15/24 appt noted:  Med: Fanapt  4 mg HS, clozapine   300 HS, , clonazepam  0.5 mg BID, lithium  150 HS, trazodone  50 prn occ.  Keppra  500 BID. B12 oral.  Atropine  drops at night.   No better with dep.  Not as many intrusive thoughts but still down.  No clear change in anxiety. SE No lightheadedness, akathisia. Minimal napping.  Sleep 6 hours or more at night Sees Dr. Maree neuro next week.  Residual stroke sx speech and memory.  Rarely needing trazodone .   No other SE issues.   Still will tend to ruminate on old relationship, causes anxiety though no contact with her. Plan: No effect with Fanapt  4 mg nightly.   Fanapt  Increase to 8 mg nightly for 2 weeks.   then if well tolerated but no benefit increase to  12 mg nightly.    03/21/24 appt noted: Med: clozapine   300 HS, , clonazepam  0.5 mg BID, lithium  150 HS, trazodone  50 prn occ.  Keppra  500 BID. B12 oral.  Atropine  drops at night.   Increased Fanapt  to 12 mg HS for 2 weeks No better and no worse.  Still doesn't feel like he enjoys things; anhedonia.  Does not get the joy out of things.   No SE with it re: Fanapt .   Decided more even anxiety benefit with clonazepam  vs lorazepam .  Anxiety  less on clonazepam .  Psych med hx extensive including ECT and  Risperidone  Maxed out risperidone  8 mg without benefit for intrusive thoughts  Zyprexa  30 Latuda 80 which caused akathisia,  Vraylar 1.5 every other day for 2 weeks; worsening intrusive thoughts,  Rexulti, aripiprazole 20 mg with akathisia,  Seroquel  1000 mg,  InVega, Geodon , Saphris with side effects, symbyax, Fanapt  4 HS .  Caplyta  SE and markedly worse. Clozapine  started end of March 2024; highest dose 600 highest dose lithium  1200 SE,   lamotrigine 300 mg, Depakote 2000 mg, Tegretol, Trileptal and several of these in combinations, gabapentin , Keppra   N-acetylcysteine, Nuedexta,     Belsomra with no response,  Lunesta no response, trazodone  200 mg,  Xanax , clonazepam , lorazepam  less sedation. Buspirone  NR Clonidine  SE with 2 trials  Viibryd 40 mg for 3 months with diarrhea,  protriptyline with side effects,  Trintellix 20 mg,  Parnate 50 mg with no response,   Emsam 12 mg for 2 months,   imipramine,  venlafaxine,   bupropion side effects,  Lexapro 20 mg, sertraline, paroxetine , Deplin, fluoxetine  80,  fluvoxamine  150 agitated; retrial ? Sz related.   Auvelity NR at one daily.  SE BID  methylphenidate 60 mg,  Vyvanse, Concerta, strattera, , modafinil,  Memantine  worsening SI? Donepezil , complained of HI, Exelon  3 BID rivastigmine  DT worsening SI  pramipexole,  amantadine ,  Patient prone to akathisia.  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 Neuro [facility] Dr. IZORA Maree, [facility] in progress.  EEG on 06/03/23 bc recent sz 08/25/23 appt Dr. 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 No breakthrough seizures since last visit. Currently on levetiracetam  500 mg twice daily. Requests a once-daily regimen. Epileptiform discharges necessitate continued anti-seizure medication to mitigate recurrent seizure  risk. Discharges are insufficient to cause psychiatric issues. Levetiracetam  XR offers once-daily dosing convenience without altering the total daily dose. - Switch to levetiracetam  1000 mg XR once daily, to be taken at nighttime.     Review of Systems:  Review of Systems  Constitutional:  Positive for appetite change and fatigue.  Cardiovascular:  Negative for chest pain and palpitations.  Gastrointestinal:  Positive for constipation.  Musculoskeletal:  Positive for back pain.  Neurological:  Positive for dizziness and tremors. Negative for weakness.       Fidgety  Psychiatric/Behavioral:  Positive for decreased concentration and dysphoric mood. Negative for agitation, behavioral problems, hallucinations, self-injury, sleep disturbance and suicidal ideas. The patient is nervous/anxious. The patient is not hyperactive.     Medications: I have reviewed the patient's current medications.  Current Outpatient Medications  Medication Sig Dispense Refill   acetaminophen  (TYLENOL ) 500 MG tablet Take 1,000 mg by mouth every 6 (six) hours as needed for moderate pain.     atropine  1 % ophthalmic solution PLACE 2 DROPS UNDER THE TONGUE THREE TIMES PER DAY AS NEEDED AS DIRECTED 5 mL 2   cholecalciferol  (VITAMIN D3) 25 MCG (1000 UNIT) tablet Take 1,000 Units by mouth daily.     clonazePAM  (KLONOPIN ) 0.5 MG tablet Take 1 tablet (0.5 mg total) by mouth 2 (two) times daily. 60 tablet 3   cloZAPine  (CLOZARIL ) 100 MG tablet Take 3 tablets (300 mg total) by mouth at bedtime. 270 tablet 0   Cyanocobalamin  (VITAMIN B-12 IJ) Inject as directed every 30 (thirty) days.     esomeprazole (NEXIUM) 40 MG capsule Take 40 mg by mouth daily.     fenofibrate  (TRICOR ) 145 MG tablet Take 145 mg by mouth daily.     Iloperidone  12 MG TABS Take 1 tablet (12 mg total) by mouth every evening. 30 tablet 0   levETIRAcetam  (KEPPRA ) 500 MG tablet Take 1 tablet (500 mg total) by mouth 2 (two) times daily. 60 tablet 0    levothyroxine  (SYNTHROID , LEVOTHROID) 75 MCG tablet Take 75 mcg by mouth daily before breakfast.     LINZESS  290 MCG CAPS capsule Take 290 mcg by mouth daily.     lithium  carbonate 150 MG capsule Take 1 capsule (150 mg total) by mouth at bedtime. 90 capsule 1   metFORMIN  (GLUCOPHAGE -XR) 500 MG 24 hr tablet Take 500 mg by mouth 2 (two) times daily with a meal.     sildenafil  (VIAGRA ) 100 MG tablet Take 1 tablet (100 mg total) by mouth daily as needed for erectile dysfunction. 30 tablet 6   tamsulosin  (FLOMAX ) 0.4 MG CAPS capsule Take 1 capsule (0.4 mg total) by mouth daily. 90 capsule 3   traZODone  (DESYREL ) 50 MG tablet Take 1 tablet (50 mg total) by mouth at bedtime as needed for sleep. 90 tablet 0   No current facility-administered medications for this visit.   Medication Side Effects: Other: mild sleepiness.  Occ twitches.\, tremor  Allergies:  Allergies  Allergen Reactions   Meloxicam Other (See Comments)    Dizziness   Zyprexa  [Olanzapine ] Other (See Comments)    Akathesia   Nsaids Other (See Comments)    Hallucinations   Ibuprofen Other (See  Comments)    Can not take because taking lithium    Prednisone Other (See Comments)    Can't sleep    Wellbutrin [Bupropion] Other (See Comments)    Suicidal thoughts    Past Medical History:  Diagnosis Date   ADHD (attention deficit hyperactivity disorder)    Anemia    Anxiety    Bipolar disorder (HCC)    BPH (benign prostatic hyperplasia)    Chronic kidney disease, stage 3b (HCC)    Constipation    DDD (degenerative disc disease), cervical    Depression    Deviated septum    ED (erectile dysfunction)    GERD (gastroesophageal reflux disease)    Graves disease    History of hiatal hernia    Hypertension    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    Hypothyroidism    Pneumonia    PONV (postoperative nausea and vomiting)     Family History  Problem Relation Age of Onset   Prostate cancer Neg Hx     Bladder Cancer Neg Hx    Kidney cancer Neg Hx     Social History   Socioeconomic History   Marital status: Single    Spouse name: Not on file   Number of children: Not on file   Years of education: Not on file   Highest education level: Not on file  Occupational History   Not on file  Tobacco Use   Smoking status: Former    Current packs/day: 0.00    Types: Cigarettes    Quit date: 2010    Years since quitting: 15.8    Passive exposure: Past   Smokeless tobacco: Former    Types: Associate Professor status: Never Used  Substance and Sexual Activity   Alcohol use: Not Currently   Drug use: Never   Sexual activity: Yes  Other Topics Concern   Not on file  Social History Narrative   Not on file   Social Drivers of Health   Financial Resource Strain: Low Risk  (11/08/2023)   Received from [facility]   Overall Financial Resource Strain (CARDIA)    Difficulty of Paying Living Expenses: Not very hard  Food Insecurity: No Food Insecurity (11/08/2023)   Received from [facility]   Hunger Vital Sign    Within the past 12 months, you worried that your food would run out before you got the money to buy more.: Never true    Within the past 12 months, the food you bought just didn't last and you didn't have money to get more.: Never true  Transportation Needs: No Transportation Needs (11/08/2023)   Received from [facility] - Transportation    In the past 12 months, has lack of transportation kept you from medical appointments or from getting medications?: No    Lack of Transportation (Non-Medical): No  Physical Activity: Not on file  Stress: Stress Concern Present (08/12/2020)   Received from [facility] - Occupational Stress Questionnaire    Feeling of Stress : Very much  Social Connections: Not on file  Intimate Partner Violence: Not At Risk (04/17/2023)    Humiliation, Afraid, Rape, and Kick questionnaire    Fear of Current or Ex-Partner: No    Emotionally Abused: No    Physically Abused: No    Sexually Abused: No    Past Medical History, Surgical history,  Social history, and Family history were reviewed and updated as appropriate.   Please see review of systems for further details on the patient's review from today.   Objective:   Physical Exam:  There were no vitals taken for this visit.  Physical Exam Constitutional:      General: He is not in acute distress.    Appearance: He is well-developed.  Musculoskeletal:        General: No deformity.  Neurological:     Mental Status: He is alert and oriented to person, place, and time.     Cranial Nerves: No dysarthria.     Motor: Tremor present.     Coordination: Coordination normal.     Comments: mild tremor  Psychiatric:        Attention and Perception: Attention and perception normal. He does not perceive auditory or visual hallucinations.        Mood and Affect: Mood is anxious and depressed. Affect is not labile, blunt, angry or tearful.        Speech: Speech normal. Speech is not slurred.        Behavior: Behavior normal. Behavior is not agitated or slowed. Behavior is cooperative.        Thought Content: Thought content is not delusional. Thought content does not include homicidal or suicidal ideation. Thought content does not include suicidal plan.        Cognition and Memory: Cognition normal. He exhibits impaired recent memory.        Judgment: Judgment normal.     Comments: Insight fair Chronically talkative .directable. But chronically mildly pressured but less so and calmer than usual. He is chronically anxious  .  No manic signs noted. Anhedonia ongoing Intrusive obsessions episodic HI/SI are better since this year but not gone and do cycle Alert and oriented.      Lab Review:     Component Value Date/Time   NA 144 04/17/2023 0314   NA 135 10/20/2022 1123    NA 142 03/20/2014 0514   K 4.4 04/17/2023 0314   K 4.1 03/20/2014 0514   CL 109 04/17/2023 0314   CL 112 (H) 03/20/2014 0514   CO2 28 04/17/2023 0314   CO2 26 03/20/2014 0514   GLUCOSE 92 04/17/2023 0314   GLUCOSE 99 03/20/2014 0514   BUN 16 04/17/2023 0314   BUN 22 10/20/2022 1123   BUN 7 03/20/2014 0514   CREATININE 1.43 (H) 04/17/2023 0314   CREATININE 0.78 03/20/2014 0514   CALCIUM  8.7 (L) 04/17/2023 0314   CALCIUM  9.5 03/20/2014 0514   PROT 6.5 04/14/2023 1047   PROT 7.3 03/18/2014 1459   ALBUMIN 3.8 04/14/2023 1047   ALBUMIN 3.2 (L) 03/18/2014 1459   AST 21 04/14/2023 1047   AST 16 03/18/2014 1459   ALT 10 04/14/2023 1047   ALT 16 03/18/2014 1459   ALKPHOS 39 04/14/2023 1047   ALKPHOS 105 03/18/2014 1459   BILITOT 0.3 04/14/2023 1047   BILITOT 0.3 03/18/2014 1459   GFRNONAA 56 (L) 04/17/2023 0314   GFRNONAA >60 03/20/2014 0514   GFRAA 54 (L) 04/18/2019 1612   GFRAA >60 03/20/2014 0514       Component Value Date/Time   WBC 4.2 01/11/2024 1031   WBC 6.5 04/21/2023 0946   RBC 4.08 (L) 01/11/2024 1031   RBC 3.92 (L) 04/21/2023 0946   HGB 12.6 (L) 01/11/2024 1031   HCT 38.3 01/11/2024 1031   PLT 396 01/11/2024 1031   MCV 94 01/11/2024 1031  MCV 89 03/20/2014 0514   MCH 30.9 01/11/2024 1031   MCH 30.9 04/21/2023 0946   MCHC 32.9 01/11/2024 1031   MCHC 32.7 04/21/2023 0946   RDW 12.1 01/11/2024 1031   RDW 12.5 03/20/2014 0514   LYMPHSABS 2.0 01/11/2024 1031   LYMPHSABS 3.2 03/20/2014 0514   MONOABS 0.4 04/21/2023 0946   MONOABS 1.0 03/20/2014 0514   EOSABS 0.4 01/11/2024 1031   EOSABS 0.4 03/20/2014 0514   BASOSABS 0.1 01/11/2024 1031   BASOSABS 0.1 03/20/2014 0514    Lithium  Lvl  Date Value Ref Range Status  10/13/2023 0.2 (L) 0.5 - 1.2 mmol/L Final    Comment:    A concentration of 0.5-0.8 mmol/L is advised for long-term use; concentrations of up to 1.2 mmol/L may be necessary during acute treatment.                                  Detection  Limit = 0.1                           <0.1 indicates None Detected    08/12/21 lithium  level 0.9 on 450 mg.  lithium  level Sept 0.8.   lithium  level July 27, 2018 was normal at 1.0.   Lithium  level [facility] October 03, 2018 = 1.2. Said he got lithium  level at [facility] as requested.  Labs not in Epic.  Recent lipids ok except higher TG than usual.  Normal A1C.  11/10/22 Checked clozapine  level on 400 mg HS= cloz 733+norclozapine 294 = total 1027  06/07/23  Vitamin B12 >300 pg/mL 848  On oral supplement  Assessment: Plan:    Yates was seen today for follow-up, depression, anxiety and medication reaction.  Diagnoses and all orders for this visit:  Severe bipolar I disorder, current or most recent episode depressed (HCC)  GAD (generalized anxiety disorder)  Panic disorder with agoraphobia  Other obsessive-compulsive disorders  Insomnia due to mental condition  Mild cognitive impairment     60 min appt with pt  needs a lot of time   .   Seen alone and then info given to parents at his request.  Chronic TR bipolar mixed and chronic anxiety.  He usually has mixed bipolar symptoms which we have not been able to completely eliminate.  See long list of meds tried.   He is  chronically unstable .  Mood is relatively stable except for the severe intrusive, ego-dystonic persistent obsessive HI/SI without intent or plan.  No better no worse.  Residual anhedonia. Hx chronic intrusive ego dystonic thoughts HI/SI.  More like obsessions than true HI/SI.   No recent mania.  Chronic dep and anxiety.  But is a little better since being on Keppra .   Psych hosp 08/02/22 with SI and acute kidney injury and elevated lithium . Processed this and disc goal of trying to stop lithium  but risk of worsening SI.  He has chronic SI and there's risk of this worsening off lithium .  Lithium  and clozapine  are the most effective meds for SI.    hosp for SZ episodes.  Fluvox stopped and clozapine  reduced to 300 mg HS.    Was RX risperidone  but he stopped that after DC.  It was ineffective in at least 2 trials.  We discussed the short-term risks associated with benzodiazepines including sedation and increased fall risk among others.  Discussed long-term side effect risk including  dependence, potential withdrawal symptoms, and the potential eventual dose-related risk of dementia.  But recent studies from 04-29-2019 dispute this association between benzodiazepines and dementia risk. Newer studies in 04-29-19 do not support an association with dementia.  Unfortunately due to the severity of set his symptoms polypharmacy is a necessity.    Lithium  being used bc chronic SI and death thoughts.  Counseled patient regarding potential benefits, risks, and side effects of lithium  to include potential risk of lithium  affecting thyroid  and renal function.  Discussed need for periodic lab monitoring to determine drug level and to assess for potential adverse effects.  Counseled patient regarding signs and symptoms of lithium  toxicity and advised that they notify office immediately or seek urgent medical attention if experiencing these signs and symptoms.  Patient advised to contact office with any questions or concerns.  Reduced lithium  to 150 mg daily Dt hx toxicity Checked lithium  level and BMP and level 1.1 on 300 mg Daily so reduced again to 150 mg daily and level 0.4 04/28/24 Call if death thoughts worsen or worsening SI.  Disc clozapine  and it's level..  Disc data showing less SI with it but he's had no changes.    Disc risk in detail including low WBC with complication, myocarditis.  Extensive discussion of CBC monitoring.  Disc going higher despite level and checking ECG bc cardiac risk at higher doses.   Disc this again bc taking Wegovy might help him to tolerate it better.   clozapine  600 mg HS was Started 12/07/22.  Started clozapine  08/05/22. 11/10/22 Checked clozapine  level on 400 mg HS= cloz 733+norclozapine 294 = total  1027 Reduced to 300 mg HS at the hospital. CBC every 12 weeks, now that on it over a year.  And DT recent FDA changes in recommendation.  Use the atropine  drops prn 2-4 on each side.  It's managed.  Did he take duloxetine?  Might help depression more.   Consider retry pramipexole off label but tricky with clozapine .   Consider Qelbree.  Or retry lamotrigine with clozapine .  Answered his questions about ketamine and FDA indications.  He understands it is off label.   Switched lorazepam  to clonazepam  0.5 mg BID  working better and more calming and lasts longer. M administers  We discussed the short-term risks associated with benzodiazepines including sedation and increased fall risk among others.  Discussed long-term side effect risk including dependence, potential withdrawal symptoms, and the potential eventual dose-related risk of dementia.  But recent studies from 2019-04-29 dispute this association between benzodiazepines and dementia risk. Newer studies in 2019-04-29 do not support an association with dementia.  Discussed safety plan at length with patient.  Advised patient to contact office with any worsening signs and symptoms.  Instructed patient to go to the [facility] emergency room for evaluation if experiencing any acute safety concerns, to include suicidal intent.  He commits to safety. His HI/SI that he complains about are intrusive ego-dystonic obsessions and not true HI SI in that there's no desire or intent ever.  Off GLP-1.  Has had weight gain from meds as a contributor. Lost 45# with Wegovy and 150#.    Needs to keep up activity as much as possible for depression.  For severe constipation seeing GI  Historically he has not done well with serotonergic medicines which would typically be used for obsessive thoughts.  But we are running out of options.   Repeat ECT not good option bc being treated for SZ.   Disc memory  concerns and mother's memory concerns bc had it before.  Disc  counseling.  [email or web address] for therapist.  Most effective therapist will need to be expert in CBT.  He has one now.   In answer to mother's question:  The med he tried when he had seizures was fluvoxamine . I'm suggesting lamotrigine in  combination with clozapine  and that has not been done.  Also Dr. Maree responded to my note about it and he agrees.  No effect with Fanapt   12 mg nightly.  But tolerated. But first, increase Fanapt  to 18 mg nightly for 1 week (12 + 6 mg), Then increase Fanapt  to 20mg  nightly (12 + 8 mg ) for 1 week.   If no benefit then wean off it by dropping to 12 mg 1 night, then reduce to 8 mg 1 night, then reduce to 4 mg 1 night, then reduce to 2 mg 1 night, then stop it.  If this fails we will retry lamotrigine bc never used in combo with clozapine .  Disc again that we are approaching the end of the options that I have for treatment and I encourage him to seek consultation elsewhere unless he wants to accept the status quo.  He has sought second opinion before and at this point I would rec a transfer of care rather than just a second opinion.  Disc SE  FU 4 weeks   Lorene Macintosh, MD, DFAPA  Future Appointments  Date Time Provider Department Center  04/18/2024  1:00 PM Cottle, Lorene KANDICE Raddle., MD [facility] None  05/16/2024  1:00 PM Cottle, Lorene KANDICE Raddle., MD [facility] None  06/19/2024  1:30 PM Cottle, Lorene KANDICE Raddle., MD [facility] None  07/16/2024  1:30 PM Cottle, Lorene KANDICE Raddle., MD [facility] None  08/16/2024  1:30 PM Cottle, Lorene KANDICE Raddle., MD [facility] None  12/26/2024  2:30 PM Francisca Redell BROCKS, MD [facility] None    No orders of the defined types were placed in this encounter.      -------------------------------

## 2024-03-21 NOTE — Patient Instructions (Addendum)
 The med he tried when he had seizures was fluvoxamine . I'm suggesting lamotrigine in  combination with clozapine  and that has not been done.  Also Dr. Maree responded to my note about it and he agrees.  But first, increase Fanapt  to 18 mg nightly for 1 week (12 + 6 mg), Then increase Fanapt  to 20mg  nightly (12 + 8 mg ) for 1 week.   If no benefit then wean off it by dropping to 12 mg 1 night, then reduce to 8 mg 1 night, then reduce to 4 mg 1 night, then reduce to 2 mg 1 night, then stop it.  If this doesn't work we will start lamotrigine.

## 2024-04-02 ENCOUNTER — Telehealth: Payer: Self-pay | Admitting: Psychiatry

## 2024-04-02 MED ORDER — ILOPERIDONE 12 MG PO TABS: 1.0000 | ORAL_TABLET | Freq: Every evening | ORAL | 0 refills | Status: DC

## 2024-04-02 NOTE — Telephone Encounter (Signed)
 Sent a 30-day supply of Fanapt  to [facility].

## 2024-04-02 NOTE — Telephone Encounter (Signed)
 He needs to order some medication from a different pharmacy. Fanapt  20mg  needs to go to [facility] on [address] in [place], [place]. (His regular pharmacy does not carry it.) He has a few samples left.

## 2024-04-12 ENCOUNTER — Telehealth: Payer: Self-pay

## 2024-04-12 ENCOUNTER — Other Ambulatory Visit: Payer: Self-pay

## 2024-04-12 LAB — CBC WITH DIFFERENTIAL/PLATELET
Basophils Absolute: 0.1 x10E3/uL (ref 0.0–0.2)
Basos: 2 %
EOS (ABSOLUTE): 0.5 x10E3/uL — ABNORMAL HIGH (ref 0.0–0.4)
Eos: 9 %
Hematocrit: 38.9 % (ref 37.5–51.0)
Hemoglobin: 13 g/dL (ref 13.0–17.7)
Immature Grans (Abs): 0 x10E3/uL (ref 0.0–0.1)
Immature Granulocytes: 0 %
Lymphocytes Absolute: 2.4 x10E3/uL (ref 0.7–3.1)
Lymphs: 43 %
MCH: 31.6 pg (ref 26.6–33.0)
MCHC: 33.4 g/dL (ref 31.5–35.7)
MCV: 94 fL (ref 79–97)
Monocytes Absolute: 0.3 x10E3/uL (ref 0.1–0.9)
Monocytes: 6 %
Neutrophils Absolute: 2.2 x10E3/uL (ref 1.4–7.0)
Neutrophils: 40 %
Platelets: 375 x10E3/uL (ref 150–450)
RBC: 4.12 x10E6/uL — ABNORMAL LOW (ref 4.14–5.80)
RDW: 12.2 % (ref 11.6–15.4)
WBC: 5.5 x10E3/uL (ref 3.4–10.8)

## 2024-04-12 NOTE — Telephone Encounter (Addendum)
 From 11/19 visit:  But first, increase Fanapt  to 18 mg nightly for 1 week (12 + 6 mg), Then increase Fanapt  to 20mg  nightly (12 + 8 mg ) for 1 week.   If no benefit then wean off it by dropping to 12 mg 1 night, then reduce to 8 mg 1 night, then reduce to 4 mg 1 night, then reduce to 2 mg 1 night, then stop it.   Pt reports being on 20 mg Fanapt  since 11/26. I reviewed the above recommendations with him and he said he wanted to give the medication a chance to work. He reports no SE, but says he doesn't feel any better.   He only has 12 mg tablets left. He asks if he should stay on 12 mg until FU 12/17 and he can pick up a titration pack to complete the taper.

## 2024-04-13 NOTE — Telephone Encounter (Signed)
 Pt called requesting return call about Rx Fanapt . Stated, was suppose to get call [phone]

## 2024-04-13 NOTE — Telephone Encounter (Signed)
 Called patient back. Told him I had samples for him to complete the taper as prescribed and he said he can't come get samples. Told him to stay on 12 mg until I heard from Dr. Geoffry.

## 2024-04-13 NOTE — Telephone Encounter (Signed)
 I see him in 5 days.  We will taper him off the med.  But he can stay on 12 mg until then or can continue to reduce to 8 mg now if he wants.  You can give samples if we have them.  I'm not sure we do. Lorene Macintosh, MD, DFAPA

## 2024-04-13 NOTE — Telephone Encounter (Signed)
 Forwarding

## 2024-04-18 ENCOUNTER — Ambulatory Visit: Admitting: Psychiatry

## 2024-04-18 ENCOUNTER — Encounter: Payer: Self-pay | Admitting: Psychiatry

## 2024-04-18 DIAGNOSIS — F5105 Insomnia due to other mental disorder: Secondary | ICD-10-CM

## 2024-04-18 DIAGNOSIS — F314 Bipolar disorder, current episode depressed, severe, without psychotic features: Secondary | ICD-10-CM | POA: Diagnosis not present

## 2024-04-18 DIAGNOSIS — F4001 Agoraphobia with panic disorder: Secondary | ICD-10-CM | POA: Diagnosis not present

## 2024-04-18 DIAGNOSIS — R569 Unspecified convulsions: Secondary | ICD-10-CM | POA: Diagnosis not present

## 2024-04-18 DIAGNOSIS — Z79899 Other long term (current) drug therapy: Secondary | ICD-10-CM | POA: Diagnosis not present

## 2024-04-18 DIAGNOSIS — F428 Other obsessive-compulsive disorder: Secondary | ICD-10-CM | POA: Diagnosis not present

## 2024-04-18 DIAGNOSIS — F411 Generalized anxiety disorder: Secondary | ICD-10-CM

## 2024-04-18 DIAGNOSIS — E538 Deficiency of other specified B group vitamins: Secondary | ICD-10-CM | POA: Diagnosis not present

## 2024-04-18 DIAGNOSIS — G3184 Mild cognitive impairment, so stated: Secondary | ICD-10-CM | POA: Diagnosis not present

## 2024-04-18 DIAGNOSIS — K117 Disturbances of salivary secretion: Secondary | ICD-10-CM | POA: Diagnosis not present

## 2024-04-18 MED ORDER — LAMOTRIGINE 25 MG PO TABS: ORAL_TABLET | ORAL | 0 refills | Status: DC

## 2024-04-18 MED ORDER — ILOPERIDONE 12 MG PO TABS: 1.0000 | ORAL_TABLET | Freq: Two times a day (BID) | ORAL | 0 refills | Status: DC

## 2024-04-18 NOTE — Patient Instructions (Addendum)
 Increase Fanapt  to 8 mg in the AM and continue 12 mg nightly for 1 week,  then increase Fanapt  to 12 mg twice daily until the next appt. If dizziness gets too bad , then stop morning dose and call us .  Start tomorrow lamotrigine  for for depression 25 mg tablets , 1 daily for 2 weeks, then 2 tablets daily for 2 weeks then 4 tablets daily.

## 2024-04-18 NOTE — Progress Notes (Signed)
 Albert [name] [id number] 21-Oct-1965 58 y.o.    Subjective:   Patient ID:  Albert [name] is a 58 y.o. (DOB Jan 21, 1966) male.  Chief Complaint:  Chief Complaint  Patient presents with   Follow-up   Depression   Anxiety   Medication Problem      Albert [name] presents to the office today for follow-up of mixed bipolar, anxiety and still grieving stress of breakup.  He requires frequent follow-up because of long-term symptoms.  At visit October 25, 2018.  We made several medicine changes because of his concerns about sleep and other issues.  We increased olanzapine  back to 7.5 mg bc not sleeping as well at 5 mg.  He has to have the prescription for quetiapine  written for up to 3 tablets at night in case insomnia is worse because insomnia makes his mood disorder so much worse.  We discussed that was above the usual max but the request was granted given his treatment resistant status. We also reduce lithium  from 900 mg daily to 750 mg daily to try to reduce tremor and muscle twitches.  At  visit March 28, 2019.  Fluoxetine  was increased to 40 mg daily.   Early January 2021 increased to 60 mg daily.  No SE.  seen June 28, 2019.  No further meds were changed except olanzapine  was increased back to 10 mg daily to see if if he could get additional benefit per his request.  Covid vaccinated.  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  Then had cholecystectomy.  April 2021 appt with the following noted: 2 episodes night sweats lately.  Memory has been very bad lately.  Repeatedly asks mother questions. Still  tend to stay in his room and his bed.  Still has rapid cycling mood swings.  Maybe some better with increase in olanzapine  to 10 and tolerating itl.  Would like to get out more but can't DT Covid.  Can perform necessary chores.  Will get out of the house when he can.  No change in death thoughts and anxiety in intensity but is better with frequency.  Usually comes and goes in waves but more persistent.   Consistent with meds.  Ativan  not helping anxiety very dramatically but he's not sure.  Fidgety.  No trigger other than still grieving relationship and can't get it out of his head.  Poor energy, concentration and more forgetful. In bed more and less active.  Sleep ok lately which is unusual.  Can concentrate on financial matters and stock market at times.  Tolerating meds.  Still some intrusive SI without reason. Less frequent obsessive thoughts about broken relationship and has been doing this for months.  Can't let it go.  Loop.   No unusual stress even with the family who is supportive. Likes the benefit that Zyprexa  gives. No sleepwalking nor falling nor odd behavior.  No history of sleep walking.  He understands this may recur with an increase.  Also wants option to rarely take extra quetiapine  300 for sleep prn. Plan:  Try to reduce lorazepam  if possible.  10/22/2019 appointment with the following noted: Continues fluoxetine  60, lithium  600 mg, lorazepam  2 mg AM and HS and 1 mg midday, olanzapine  10, quetiapine  600 mg HS. Still anxious chronically and including driving in crowded spaces.  Doesn't thing Ativan  helps as well as Xanax  but less cognitive problems. Sleep is not as good.  Occ EFA.  More EMA and wanting to do things in the middle of  the night but this is not typical.  Wonders about why that happens.  Some napping.   Tolerating meds.  Asks about weight loss meds.  Disc this in detail.   Plan no changes except OK meclizine  prn vertigo.  02/11/20 appt with the following noted: Able to gradually reduce lorazepam  to 1 mg AM and HS. More depressed over time and less interested in things and less interest in going out but does with his parents. Has reduced from 800 to 600 mg HS with some awakening but usually able to go back to sleep.Albert [name]  Spending a good amount of time in bed bc watches TV in bedroom.  Parents watch TV in different part of the house.  No mood swings he notices.   Concerns  about weight gain about 200#. Likes olanzapine 's benefit for sleep. Plan: Trial for TRD to  Increase fluoxetine  to 80 mg daily  to use the combo with olanzapine  for TR bipolar depression.   04/21/20 appt with following noted: I thought in beginning some benefit with fluoxetine .  Lifelong negative thinking continues.   Not much bipolar.  Reads a lot on bipolar.   Still tired and anxious a little more may be seasonal.  No familial stressors.  Tends to lay down in afternoon.  Anxiety not over anything in particular.   No SE with fluoxetine . Took meclizine  prn.  Easily motion sick.    Asks questions about newer drugs for bipolar like Caplyta . Plan:  No med changes  06/30/20 appt noted: Tired of wearing masks.  Vaccinated.  Asked questions about when this will end with mask mandate.  Mood up and down some since here and getting up 2-3 times.  Disc awakening problems.  Trying to do better bc night eating some.   Mood is about average today so far.  [name] out with friends for breakfast.  Recognizes activity helps mood.   3 days in a row napped a lot in the afternoon.   Bed is a comfort place.  Gets bored and hard to motivate.  Tries to stay away from night eating and spending.   More depressed when alone and wonders about med changes. Plan: Failed response to olanzapine  + fluoxetine  for TR bipolar depression.   Reduce fluoxetine  to 1 daily and reduce olanzapine  to one half nightly for 10 days and then stop it. Then start Caplyta  1 daily  08/27/2020 appointment with the following noted: Patient decompensated with the above switch to Caplyta  with intrusive suicidal thoughts and had to be hospitalized for psychiatric reasons.  Multiple phone calls with family members since that time.  Spoke with the psychiatric nurse practitioner with the decision to restart olanzapine  in place of the Caplyta  given the patient was more stable while on the combination of Seroquel  and olanzapine  than with the  Caplyta . Caplyta  triggered HI/SI and depressed and confused on it, and agitated and still has some of it now. No akathisia. Frustrated with lack of therapy at the hospital.   Hydroxyzine  didn't help jitteriness.  Feels some better.  Fleeting SI & HI but not obsessive like it was prior to hospitalization.  Feels a little amped up and nervous.  Scared of what could happen.  Apprehensive being here talking about things. 5-6 hours sleep last night and would like to have more. At mom and dad's house right now and up more in the day. Inadvertently stopped lorazepam  abruptly by mistake likely contributing to shakiness. Still some memory issues.  Trying to stay out of  bed when watching TV.  Some awakening but managing. Occ fleeting SI and distracts himself.   Always spends a lot of time just laying around.   Can have anxiety for no reason like coming here, and varies in intensity without pattern.      Less panic than in the past.  Some chronic depression and hyperactivity and loudness and hyperverbal.  Usually back to sleep.  Total 6 hours but some napping, awakens 2-4 times nightly but back to sleep..  Taking quetiapine  600 mg HS.  still lacks interest and motivation.  Can follow a TV show if interested. Plan: Restart lorazepam  1 mg in the morning, 1 mg in the afternoon and 1 mg at night for anxiety Stop hydroxyzine   Increase olanzapine  to 1 and 1/2 of 10 mg tablets in evening  09/05/2020 appt noted: seen with parents today at his request Made med changes noted and tolerated the changes Better than I did last week. Now only fleeting HI/SI and no where near what it was.  Sleeping better.  Still not a lot of energy.  Anhedonia.  Less agitation.  A lot of anxiety all the time but it's helping.  Would like more energy and motivation.   Doesn't handle stress well. Parents note he's less shakey.  He's still staying with his parents.  Only driven once since this happened and F wants him to be able to drive and  function more independently.  M agrees he's better.  Memory is better but not normal.  Primarily STM problems No akathisia with olanzapine  this time.    Administering his own meds but mo watched. Slept 8 hours last night. Plan consider further reductions in quetiapine   10/17/20 appt noted: Stayed on Seroquel  600 HS and olanzapine  15. Olanzapine  seemed to do most for the sleep.  Also helping eliminated HI/SI for the most part.  Wants to try to increase it.  Depression is much better with less rumination also.  Fears akathisia at higher dose as in the past. Also on fluoxetine  40.   Plan: Reduce Seroquel  to 1 and 1/2 tablets at night Increase olanzapine  to 1 of the 15 mg tablet and 1/2 of the 5 mg tablet for 1 week,  Then, if tolerated increase the olanzapine  to 20 mg or 1 of the 15 mg tablets and 1 of the 5 mg tablets. If possible then also reduce quetiapine  to 1 tablet at night.  11/05/2020 appointment with the following noted: Up to olanzapine  20 mg hs for 3 days.  Reduced Serooquel to 450 mg HS.  Didn't try to go lower. Had hiatal hernia and umbilical hernia surgery recently with no problem.   No akathisia so far. Sleep ok with meds so far changes.  No mood changes yet but overall likes the smoothness of olanzapine  and helping sleep without knociing him out. Still some occ HI/SI, he thinks bc of anxiety generally.  Easily anxious. Plan: Continue olanzapine  20 mg nightly longer to give it time to help mood sx reduce quetiapine  to 1 tablet at night to minimize polypharmacy and reduce risk of akathisia.  01/07/21 appt noted: Able to reduce quetiapine  300 mg HS ok with decent sleep but still wakes a lot.  No AM hangover.  Don't have a lot to do.  Can enjoy going out and doing things.  But doesn't do it longterm.  Family is doing good.  Father started year 9 at his job.   Tolerating the meds well.  May wake more with quetiapine   reduction. Lost 10#. Wonders if could try other meds he didn't  tolerate before bc now can tolerate olanzapine  and couldn't tolerate it in the past DT akathisia. Mood never great but can swing into depresssion without reason.   Plan: Continue olanzapine  20 mg nightly longer to give it time to help mood sx reduce quetiapine  to 1/2 of the 300 mg  tablet at night to minimize polypharmacy and reduce risk of akathisia.  02/02/2021 appointment with the following noted: Tried reduced quetiapine  to 150 mg and after a week had withdrawal sx including jittery and SI and he increased to 250 mg daily.  Felt better after increasing to 300 mg daily and last week reduced to 250 mg daily.  After increase quetiapine  felt better in a few days.  Seems more alert with less quetiapine  but can't tolerate a quick withdrawal.   Some awakening.  Plan: Reduce quetiapine  by 50 mg every 2-4 weeks.  03/09/2021 phone call: Pt called and said that he is weaning off the seroquel . He was down to 200 mg of the seoquel and he started having withdrawal symptoms and sucidal thoughts. He went back up to 250 mg because he knew he was good on that.    Pt stated he starting back taking 250 mg on Saturday due to the symptoms he was having.Now he is no longer having suicidal thoughts but still very jittery. MD: He may just need to taper slower than most people.  If he can tolerate the 250 mg Seroquel  with the jitteriness it should get better over the next 1 to 2 weeks.  Then he can try going down again by 50 mg at a time.  He probably needs to go down by just 50 mg every 2 to 4 weeks and wait till he fully adjusts to each reduction before he reduces again.  03/17/21 appt noted: Hernia surgery 2 weeks ago. Seroquel  250 mg for a month then 200 mg daily and had SI and increased again to 300 mg daily.  Would love to get off it.  Withdrawal triggers intrusive HI/SI but otherwise doesn't have them No SE. Sleep is OK but not enough REM sleep.  Satisfied with other meds.   No current HI/SI.  No paranoia.  Still  has anxiety and taking Ativan  every 6 hours.  It works but doesn't cure it. Chronic anxiety and ask about ketamine for chronic depression. Plan: Because he had WD reducing from 250 to 200 mg daily will have to go slowly. Reduce quetiapine  by 25 mg every 2-4 weeks. Reduce Seroquel  to 250 mg for 2 weeks, Then reduce to 225 mg for 2 weeks, Then reduce to 200 mg for 2 weeks, Then reduce to 175 mg for 2 weeks, Then reduce to 150 mg Also: Buspirone  30 mg tablets for anxiety Start 1/3 tablet twice daily for 1 week Then increase to two thirds twice daily for 1 week Then increase to 1 tablet twice daily  04/15/2021 appointment with the following noted: Reduced Seroquel  to 200 mg HS and increased buspirone  to 30 mg BID STM px and wants to try to reduce Ativan  to see if it is better.  But fears with drawal sx. Parents complain about his memory. Less HI and SI since here. Less depressed. Plan: Buspirone  30 mg tablets for anxiety Start 1/3 tablet twice daily for 1 week Then increase to two thirds twice daily for 1 week Then increase to 1 tablet twice daily Continue olanzapine  20 mg nightly longer to give  it time to help mood sx Because he had WD reducing from 250 to 200 mg daily will have to go slowly. Reduce quetiapine  by 25 mg every 2-4 weeks. Reduce Seroquel  to 250 mg for 2 weeks, Then reduce to 225 mg for 2 weeks, Then reduce to 200 mg for 2 weeks, Then reduce to 175 mg for 2 weeks, Then reduce to 150 mg Trial taper lorazepam : In hopes of improving short-term memory  04/30/2022 phone call: Complaining of withdrawal symptoms reducing lorazepam  from 0.5 mg 5 times a day to 0.5 mg 4 times daily.  05/21/2021 appointment with the following noted: Gotten onto buspar  30 BID and down on lorazepam  to 0.5 mg QID. Gotten down to Seroquel  200 mg HS A little restless every now and then but able to reduce lorazepam  for the last weeks. Is too inactive and lays in bed too much watching TV.   Can' t  tell effect of buspirone  bc of other changes.  Sleep with awakening with 6-7 hours. Fleeting SI without trigger or plan or intent. Depressed more than anxious. Enjoys watching stocks and investing. Plan: Trial taper lorazepam  gradually over time In hopes of improving short-term memory But for now continue lorazepam  0.5 mg QID Start clonidine  0.1 mg tablets one half at night for 3 nights, then 1/2 tablet twice daily for 3 nights, then one half in the morning and 1 tablet at night If clonidine  does not noticeably help anxiety call the office  05/27/2021 phone call initially complaining of homicidal and suicidal thoughts after taking 1 mg of clonidine .  He was told to stop the medication.  But then called back stating he was having homicidal and suicidal thoughts from withdrawal from the medication which was not logical.  He agreed it was not logical and stated on 05/29/2021 that he felt better  06/23/2021 appointment the following noted: No clonidine . Sunday panic attack eating out and then having SI.  Stayed with father to feel safer. Intrusive thoughts of hurting parents or others.  HI only when has SI.  Usually panic without SI.  Then had intrusive thoughts about hurting others.   Usually intrusive thoughts are brief and fleeting.    07/20/21  appt noted; More anxious with less lithium .  More anxious in his body.  Not more depressed or irritable.  Sleep is the same.  More racing thoughts.  Chronic SI probably a litte worse but not unmanageable. Rduced lithium  last week to 300 mg daily. No recent change in lisinopril . Off the clonidine  for a week or so. Doesn't drink much water.  Drinks a lot of diet coke. No marked akathisia or RLS but does shake his leg DT anxiety. Worries about needing mor emedicine. Plan: Increase olanzapine  to 25 mg nightly bc more racing thoughts and anxiety with less lithium  DT severity of sx and difficulty getting off Seroquel  conside rincrease.  07/30/21 TC : CO  racing SI today and wonders about whether increased sx racing thoughts, hyperactivity, SI related to Reduced lithium  from 600 mg daily to 300 mg daily recently DT lithium  level 1.8.  could be related.    Plan: increase olanzapine  30 mg pm Today take lithium  600 mg now.  Tomorrow start lithium  CR 450 mg daily Continue olanzapine  30 mg PM until SI resolves unless akathisia returns.  Lorene Macintosh, MD, DFAPA  08/17/21 appt noted:  No akathisia with olanzapine  30 mg pm (about 3 weeks) and is sleeping better. Increased lithium  to 450 mg daily. Tolerating meds. Still racing  negative thoughts maybe some better.  Asked about it. SI come and go.   Anxiety about the same as depression.  No motivation unless has to do something. Plan: Reduce  fluoxetine  20 mg 1 daily in hopes racing thoughts and anxiety are better.  09/14/2021 appointment with the following noted: No difference noted with reduction fluoxetine  20 mg daily. Most of the time negative thoughts, ex what if I get in a wreck?  Frequent negative thoughts.   Still on lorazepam  0.5 mg TID.   Wonders about trying prior meds that caused akathisia bc no longer gets it with olanzapine  30 mg daily. Sleep is usually ok.  Taking Seroquel  200 mg HS.  Thinks the olanzapine  helps his sleep and doesn't want to stop it. Panic 1-2 times per month.  Then takes extra lorazepam . Plan: DC fluoxetine  20 mg 1 daily  Trial Auvelity off label for depression 1 in the AM for 1 week then 1 in AM and 1 with evening meal  10/15/2021 appointment with the following noted: Occ of getting confused as to day and went to church on a Thursday my mistake.   Had it happen a few months ago too.  Memory is terrible. Thinks he couldn't tolerate more Auvelity. Not a lot of benefit from [place] 1 daily. Still quite a bit of anxiety.   A little drowsy  and sleeps 7-8 hours at night. Word finding problems.  Lost wallet. Plan: DC  Auvelity  For [facility]: memantine  1/2 tablet in the  AM for 1 week, then 1/2 tablet twice daily, then 1/2 tablet in the AM and 1 tablet at night for 1 week then 1 tablet twice daily  10/22/2021 phone call complaining of additional stress asking to start new medication because of anxiety and more suicidal thoughts without intent or plan.  Reports taking more than 4 prescribed Ativan  a day recently.  Asking to increase olanzapine  which is already at 30 mg daily. MD response:We cannot go any higher in the dosage of olanzapine  that he is currently taking.  A lot of these suicidal thoughts are apparently anxiety driven.  Let us  have him resume taking paroxetine  which is one of the more effective medicines for anxiety.  I will send it in and the instructions will be paroxetine  20 mg tablets 1/2 tablet daily for 1 week then 1 tablet daily  11/16/21 appt noted: Multiple phone calls since she was here.  Complaining of suicidal thoughts from memantine  which was stopped. Started paroxetine  and up to 20 mg daily.  Continues olanzapine  30 mg, lithium  450 mg nightly, lorazepam  0.5 mg 4 times daily , And quetiapine  200 mg nightly. Questions about whether memantine  caused the neg SI really bc has them at times. When has SI often anxiety driven.  Will tend to ruminate on past relationship ended.   Edgy and irritable all the time but memantine  seemed to make it worse. Mind is getting better with less SI lately.   No SE with paroxetine .   Aggrivated by forgetfulness.  eXample, talked with someone about going to breakfast last night and then forgot to go this mornin.g. Plan prescribed donepezil  5 mg daily for cognitive complaints specifically forgetfulness  12/31/2021 appointment with the following noted: He had been prescribed donepezil  5 mg for cognitive complaints.  He called back reporting he felt it was causing homicidal thoughts and was instructed to stop it.  He has had these types of thoughts in the past.  He had no desire to act on  them. Discouraged. Ongoing  depression and reduced enjoyment and negative thoughts.  Can enjoy some things. A lot of anxiety chronically.  Trying to limit Ativan  to 5 of 0.5 mg daily.  Sometimes needs 2. Constipation for years.  02/03/22 appt noted: Depression makes LBP worse. Ongoing chronic depression. Tolerated rivastigmine  without triggering SI Chronic anxiety and Ativan  helps some. Occ panic but not usual.  Asks if any other meds coud be used. Plan: But for now continue lorazepam  0.5 mg QID, but use LED for cog reasons Increase rivastigmine  to 3 mg capsule 1 twice daily or 2 of the 1.5 mg capsules twice daily.  Call if you have problems with nausea Start dextromethorphan  1 capsule twice daily . Thi sis off label for depression based on MOA of Auvelity and use of paroxetine  to prolong half life of DM. Presence of paroxetine  may have been reason he didn't tolerate Auvelity brief trial before.  Will proceed slowly.  03/17/2022 appointment noted: Current psych meds: Rivastigmine  3 mg twice daily, quetiapine  200 mg nightly, paroxetine  40 mg daily, olanzapine  30 mg nightly, Namenda  10 mg daily, lorazepam  0.5 mg 4 times daily, lithium  CR 450 mg nightly, dextromethorphan  15 mg twice daily A lot of anxiety since here.  Some days taken more lorazepam  to 6-7 tablets daily.  No major triggers but anxious even about coming here and other normal things.  Cancelled trip to collect rocks with friend and cancelled the trip bc anxious.  Usually if can go he is ok.  Lots of SI ongoing Memory might be a little better. Asks how he responded to V[place]lar.  Will check paper chart.  03/24/22 TC:  CO SI worse and restilless with Rivastigmine  9 mg . MD response:  Stop the rivastigmine  since [place] appears to be having side effects from it.  He was having anxiety at the last appointment 2 and then we increased the dose.  This might be causing the anxiety to be worse. He has chronic suicidal thoughts without intent or plan.  However it appears  the thoughts are more intense with the rivastigmine      05/06/21 appt noted: Stopped rivastigmine  DT anxiety and worsening SI.  Was better the next day. This week had some SI.  Thinking about the prior relationship tends to lead to SI. Will take Ativan  when gets SI and it helps a little at 0.5 mg at a time. Plan: Consider fluvoxamine .  Yes this may help with obsessions on old GF and he failed other optioons. Start fluvoxamine  1/2 of 50 mg tablet and reduce paroxetine  to 1 and 1/2 of 20 mg tablets for 5 days, Then increase fluvoxamine  to 1 tablet daily and reduce paroxetine  to 1 tablet daily for 5 days, Then increase fluvoxamine  to 1-1/2 tablets daily and reduce paroxetine  to 1/2 tablet daily for 5 days, Then increase fluvoxamine  to 2 tablets daily and stop paroxetine    06/07/22 appt noted: Feels like the fluvoxamine  50 BID is too much.  On this a couple of weeks. Worries he might be worse with this with regard to SI.    He thinks it's worse with increase.   New neighbor who has a young boy. He worries that Georjean is triggering depression and intrusive SI/HI a couple of days later and lasts a little while. Has had anxiety turning into panic attacks.  This gets associated with depression and SI/HI.  These thoughts are intrusive and he doesn't want to act on them.  Can have HI even towards family  with whom he has no negative emotions.  Little things like a bump up in parking lot sets off his depression and anxiety and doesn't seem to handle things.   No sig akathisia.  In the past it's been continuous.   Awakens every 2-3 hours.  Usually goes back to sleep with meds. Has not had alcohol in 20 years and had some intrusive thoughts without drinking.   Plan: As soon as he feels comfortable increase fluvoxamine  to 50 mg AM and 100 mg PM for intrusive thoughts of HI/SI and obs on GF  07/06/22 appt noted: Increased fluvoxamine  to 150 mg daily and felt more agitated and some SI so reduced to 100 mg 4 days  ago and feels better so far.  Likes the fact it is good for anxiety and obs thoughts.  Wonders if there is a similar med.  Frustrated he can't get relief.  Some improvement in obs on exGF but not resolved. Continue other meds: lithium  CR 450 mg HS, lorazepam  0.5 mg QID, Memantine  10 BID, olanzapine  30 mg HS, quetiapine  200 mg HS Tingling and burning in legs at night but doesn't interfere with sleep. Got insurance to pay for [facility] and lost 13#.   No full panic lately. But some anxiety waves.   Still major problems with memory. Plan: Wean fluvoxamine  50 mg daily for a month then 1/2 daily for 2 weeks then stop it DT  NR.  Watch for more anxiety.  07/28/22 TC: Con Dawna DASEN, CMA  to Me     07/28/22  4:16 PM Note Mom called reporting patient has been confused, dizzy, and sluggish for the last 3 days. He is now staying with her. She had several questions in regards to this:   Lithium  level - she feels he needs one done, last result in Epic was 08/2021 that I see. She would like order sent to [facility] for tomorrow.   Patient is on Wegovy, has been for about a month, and she wonders if this could cause sx.   Patient has meclizine  and she is questioning if she should give that for his dizziness considering other sx.   She questions if perhaps he got up during the night and took additional medications, though doesn't know if this happened and if so what he took.    On 3/5 visit he was to wean fluvoxamine  to 50 mg qd for a month and then 1/2 tab for 2 weeks and then stop. Mom and pt unsure if he started the wean.    He has F/U 4/4.        Me  to Con Dawna DASEN, CMA   CC   07/28/22  5:44 PM Note Yes get lithium  level in the AM and don't take any lithium  tonight or in the morning before the blood test.     Georjean should not cause these sx except if he is losing wt quickly or is dehydrated it can cause lithium  toxicity and include these sx.  He needs to drink lots of water.   He should go  ahead and wean the fluvoxamine  as instructed in the last appt.   If the sx get worse, go to the ER.    Me   CC   07/29/22  6:49 PM Note RTC   Sx not as severe as when he had to go to hospital in the past.  [name] got Lithium  level today.   Speech is not good .  Confused at times,  but better today.  A little tremor .  A little balance problems.  No falls.  No diarrhea Took lihtium 450 last night. Mo says he got up in the middle of the night and moved things around last night and he doesn't rmember.   No change in night time meds.   Gradually reducing fluvoxamine .   No extra lorazepam .     Spoke with mother who verifies the same.   No med changes but push fluids.   Lorene Macintosh, MD, DFAPA      08/05/22 appt : with parents Pt hosp 08/02/22 with acute kidney injury and somewhat elevated lithium  level with some confusion.  Spoke with psych NP at hosp and lithium  was decreased to 450 mg HS. No pending appt known with medical doctors about kidney function. Don't feel good and having HI and SI and feels he needs lithium  for these issues.  Doesn't feel he should have been discharged.   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 No added meds at night but has been confused at night and getting up multiple times and doing things he doesn't remember.  This concerns parents.  Parents been up at night with him and he doesn't make sense.  Still groggy in the am per mo for an hour.   Plan: Plan: Get lithium  level and kidney test ASAP Stop fluvoxamine  Reduce lithium  300 mg capsule 1 at night Monday night: reduce to olanzapine  to 20 mg at night (stop the 10 mg tablet) and start clozapine  prescription. Monday 4/15 : increase clozapine  to 100 mg at night and reduce quetiapine  to 1/2 of 200 mg tablet and continue olanzapine  20 mg each evening.  08/01/22 & 08/11/22 ED for HI and SI  08/12/22 TC mother:  Mother, Patsy called. Per note made by Harlene Franchot PIETY. He was referred to [facility].Reporting that he did go up  there last night. This morning they called her and told her to come pick him up. They told her he wasn't having homicidal or suicidal thoughts and it would be four to five days of him waiting to go to a facility in [place]. They recommended him for PHP to start 4/16. She picked him up today. He is worried about these thoughts coming back up. Also, [facility] came out yesterday to assess him and offered counseling from [facility]. He wants to do counseling with them.  Any input on care he can receive?  He is so discouraged and wants to be safe.  (She notes that the [facility] gave him- Zyprexa  10mg  at 1:07am and again at 1:25am per the med list- based on his previous med list) He was supposed to be off of Zyprexa10mg . Has been on  Clozapine  since Monday. He did go ahead and take it before he left ( 2pills) so he hasn't missed a dose and did go get labwork done. Is wanting to continue the Clozapine , please check the labwork to be able to fill the 100mg  dose.    MD resp:  Continue all meds as RX except go ahead and increase clozapine  to 100 mg tablets, 1 each night as soon as he can get the 100 mg tablets from pharmacy.  RX sent.  Trying to speed up his chance of response by getting it to a hgiher dose.  Clozapine  is very effective for HI/SI.  If he gets worsening HI/SI in the interim it will not be caused by meds but just the natural waxing and waining of his sx.  Once clozapine  dose gets  high enough it should help but avg patient needs 300 mg so we have to just be patient.      08/20/22 appt:  Up to 100 mg clozapine  HS SE constipation and using Colace, Miralax .  Been to [facility] twice  since here.   Ongoing continuous HI/SI.   All I want to do is go to sleep to escape.  Dep causing the thoughts.   Taking Ativan  0.5 mg BID-QID.  He thinks mother not giving him enough of it.  He thinks anxiety triggers HI/SI.  He fears HI and hopes he's strong enough to reisist.  Not angry or wanting to hurt anyone in reality but has the intrusive  thoughts. Conc and memory still not good.   Sleep not much different.   Staying with parents currently. Dep gotten worse M worrying he's dependent on lorazepam .   Confusion at night is no longer happening.  Not wandering at night.  Plan: For severe constipation start Linzess  145 mg AM Get lithium  level and kidney test ASAP Stopped fluvoxamine  and confusion at night is gone  lithium  300 mg capsule 1 at night  olanzapine  to 20 mg at night  increase clozapine  to 150 mg at night for 3 nights then 200 mg HS   olanzapine  20 mg each evening. Continue lorazepam  0.5 mg QID, needs this and informed mother  09/07/22 appt noted: Psych meds: clozapine  250 MG HS, lithium  300 mg HS, lorazepam  0.5 mg BID-QID, olanzapine  20 .  Off Seroquel , linzess  290 mg daily, Miralax , Colace. Sleep 7-8 hours without change from before clozapine .   Mood is still very depressed and still gets anxiety. Still problems with constipation.   Dep is so much it is causing intrusive HI/SI.   No more bizarre behaviors at night.   Plan: Plan: Get lithium  level and kidney test ASAP  lithium  300 mg capsule 1 at night  olanzapine  to 20 mg at night  increase clozapine  to  300 mg HS as soon as tolerated and then 400 mg HS.  olanzapine  20 mg each evening. Continue lorazepam  0.5 mg QID, needs this and informed mother  10/07/22 appt noted: alone and with parents Rough month.  Altered voice for a couple of weeks or so..  Mind not good.  Feel out of sorts.  Jittery.  Constipation.  No appetite.  On Wegovy.  Saw GI doc.   May need to reduce Wegovy bc poor appetite and constipation.  Lost 25 #. Current psych med: clozapine  400 mg HS about a week or 2, olanzapine  20 mg HS, lithium  300 mg HS, lorazepam  0.5 mg QID. Reduced intrusive thoughts.  2 nights since here night eating.   Sleep 6 hours.  Limited napping during the day.   Anhedonia.  Lower motivation.  Tremor worse. M says he sleeps pretty well but awakens  BP is under  control. Plan Reduce lithium  to 150 mg capsule 1 at night DT level higher Reduce  olanzapine  to 10 mg at night  Continue clozapine  400 mg HS bc helping SI and HI and  Continue lorazepam  0.5 mg QID, needs this and informed mother  11/08/22 appt noted:  alone and with parents. Ongoing calls between visits Doesn't think clozapine  helping him sleep as well as olanzapine  for sleep or any other benefit. While at restaurant had intrusive HI/SI. Staying with parents seems to provoke intrusive HI.  Is grateful to them.  Sat woke up with them.  Sometimes goes away and sometimes not.  No know trigger and  no intent. Saw PCP.  Off Wegovy.  Started metformin . No effect noted from clozapine .   M limits his lorazepam .  PCP referring him to neuro to FU abnormal head scan.  He wonders if his speech and memory problems are related.   Plan: Cont clozapine  400 mg HS Check clozapine  level and then direct dosing.  11/16/22 TC from pt to disc level:  MD resp:  Clozapine  level ok but may benefit with increase to 500 mg nightly.  Tell him to increase with current supply.     12/06/22 TC from pt:  complaining of worsening intrusive ego-dystonic HI/SI without intent, desire or plan.   MD resp ok to increase clozapine  to 600 mg HS.  12/09/22 appt noted: Complaining of ongoing racing thoughts and worsening HI/SI without intent or desire or plan.  Racing thoughts chronic and other thoughts wax and wane.  Usually triggered if starts thinking of something negative Meds: clozapine  600 mg HS, lithium  150, lorazepam  0.5 mg QID, olanzapine  10 HS Sleep with EFA.   Will have trouble going to sleep sometimes with thoughts. Doesn't think clozapine  makes him sleepy.  Not really drowsy daytime. No dx OSA and no known apnea.  SE a little dizzy.  Constipation managed with BM every 2-3 days. Better than before.  PCP RX metformin  and it helped. Plan: Plan for intrusive HI/SI wean olanzapine  and trial of risperidone .  Reduce olanzapine   to 5 mg HS for 1 week then stop it.  Addressed his fears of not sleeping off olanzapine . Then if sleeping start risperidone .  01/11/23 appt noted:  also seen with parents Psych meds: clozapine  600, lorazepam  0.5 mg QID, No olanzpaine ,  lithium  150 mg daily, no risperidone  yet. Lost 45# on Wegovy down to 150#.   Still having intrusive HI/SI as noted before. Sleep no worse.  Takes a couple of hours after clozapine  to sleep.  EFA 2-3 times nocturia.   Still racing thoughts.  Ongoing px with conc, memory,  planning Scored 73 on 100 pt test for cognition.   Some trouble with articulation and speech at times.   If could get rid of racing and intrusive thoughts I'd be 100% better. Neuro says no stroke history and low B12 and workup ongoing at [facility], Dr. IZORA Fairly. Memory is still terrible.   Plan: Start risperidone  2 mg tablet, 1 at night for racing thoughts and intrusive homicidal and suicidal thoughts. Call in 3 weeks if not better and we will increase the dose  02/09/23 appt: seen also with parents , 60 min  Lost to 152#. Meds: as above, risperidone  incr to 4.5 mg HS for intrusive HI/SI SE drooling off an on.  Tremor mainly worse with a little more risperidone  HI/SI intrusive thoughts wax and wane.  Worse when lays down. Takes 2 hours to fall asleep.  Needs to get OOB when awakens.  6 hours sleep. No falls.   Would rather try to increase risperidone  than other options.    04/12/23 appt virtual:  seen alone CC:  Multiple phone calls about ongoing intrusive, obsessive, ego dystonic HI/SI thoughts without plan or intent. Didn't come to the office bc of these thoughts.  I know I don't want to do it.  Can last four hours.  Sees drug advertisements that talk about SI and that will trigger obsessions about it. Meds: clozapine  600 mg HS,  lithium  150 mg HS, lorazepam  0.5 mg TID, risperidone  increased to 8 mg and now back to 4mg  HS.  Neuro sched MRI tomorrow. Enough sleep 5-6 hours at night.  Not  napping.   Sometimes gets confused with med but not ususally.   No recent mania.   Depressed and anxious. Plan: AVS: Reduce risperidone  to 1/2 of 4 mg tablet in evening for 1 week then stop it. Reduce clozapine  to 5 tablets nightly Start fluvoxamine  100 mg tablet 1/2 tablet nightly for 1 week then 1 tablet nightly for obsessive, intrusive thoughts  05/25/23 appt in person:  not with parents, met with [facility] with 2 sz and then had psych hosp since here.  Another EEG 06/03/23.  Neuro DR.  Loreli.  RX Keppra .  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 Psych med: clozapine  reduced to 300 HS, stopped ? Risperidone  he's not sure, clonazepam  0.5 mg BID, lithium  150 HS, trazodone  50 prn rare.  Keppra  500 BID. Trazodone  good sleep med. SE drooling again.   Not sure any mental health effect of Keppra .   Denies any street drugs despite positive UDS for ecstasy. Overall still having intrusive HI/SI without intent or plan chronically but maybe is a bit better than before hosp stay. Sleep is better.  Lost 40# on Wegovy.  Plan no change  06/22/23 appt noted: EEG shows some epileptiform activity with FU pending with another 4 hour EEG pending. Meds unchanged Overall mental health about the same or a tad bit better.  Seems he can distract himself from intrusive thoughts and they are less common than they were.   CC mouth watering from med.  Choking middle of the night.  Atropine  helps some but has to be repeated and pain in the ass.   Sleep 6 hours.  Sometimes taking trazodone  50.  Sleep is not that bad.  Psych med: clozapine  reduced to 300 HS, , clonazepam  0.5 mg BID, lithium  150 HS, trazodone  50 prn rare.  Keppra  500 BID. Plan no changes  07/20/23 appt noted:  alone and with parents. Psych med: clozapine  reduced to 300 HS, , clonazepam  0.5 mg BID, lithium  150 HS, trazodone  50 prn rare.  Keppra  500 BID. B12 oral.  EEG sched for 08/2023; appt 08/25/23 Maybe a little better  but still same intrusive thoughts.  They cause him to have somatic anxiety like palpitations.  You know I don't want to do any of those thoughts.    Out of the blue.   SE still drooling.  Using drops some.  But not often. Anna Slayden therapist in [place].  Likes her.  She's good at teaching . Awakening 2-3 times night nocturia usually.  Back to sleep ok Parents think he is better with more interest. Seems less disturbed .  More outgoing and social and mother agrees. Switched lorazepam  to clonazepam  0.5 mg BID seems to be working better and more calming and lasts longer.  08/18/23 appt noted: Med: clozapine  reduced to 300 HS, , clonazepam  0.5 mg BID, lithium  150 HS, trazodone  50 prn rare.  Keppra  500 BID. B12 oral.  Appt next week with Dr. Loreli to review EEG. No med changes.  Still on above. SE drooling. No effect from atropine  2 drops . Mood not worse but not a whole lot better Anxiety still better with clonazepam  vs lorazepam . Sleep pretty good 5-6 hours and would like to sleep more.  Nocturia.   Not sedated from clozapine .  Rare naps. No other medical px except about EEG.   Low motivation bc doesn't feel like it.  So disgusted with lack of  response with meds.   Does limited chores.  Has cleaning lady.  At parent's house off and on. Go through the motions.  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  Just lay around.   Plan: No med changes.  Will wait to make med changes until after EEG work up completed.    09/19/23 appt noted:  disc plan with parents.  Med: clozapine  reduced to 300 HS, , clonazepam  0.5 mg BID, lithium  150 HS, trazodone  50 prn rare.  Keppra  500 BID. B12 oral.  Neuro  Dr Maree EEG spikes not likely to cause psych sx Frustrated with chronic psych sx dep.  Gets angry over the fact that no meds seem to help. Goes out to eat and enjoys food but doesn't feel like doing much.   No sig change in psych sx.  Chronic anxiety and dep.  Anxiety is better with clonazepam  than other meds.  Will  get triggered in loud places with agitation.  Low tolerance of stimulation. OCD therapist Anna Slayden , Bethania, said she couldn't help his chronic intrusive HI/SI.   Still has spells of intrusive HI/SI Plan no changes  10/24/23 appt noted:  parents here today Med: clozapine  reduced to 300 HS, , clonazepam  0.5 mg BID, lithium  150 HS, trazodone  50 prn occ.  Keppra  500 BID. B12 oral.  Atropine  drops at night.   No SZ in over 6 mos.  Not driving again yet. Trazodone  works when needed.  Likes it .   Some panic but less severe and frequent.  Not as bad.   SE no much except drooling ongoing.  Uses atropine  drops at night Don't like things I used to like but not really new.   Clozapine  good for sleep.  Still some intrusive thoughts but not as bad as they were. F 53 yr at [employer].  Generally dep is worse than anxiety.  Anhedonia and intrusive thoughts.  Plan: no changes  12/14/23 appt noted:  Med: clozapine   300 HS, , clonazepam  0.5 mg BID, lithium  150 HS, trazodone  50 prn occ.  Keppra  500 BID. B12 oral.  Atropine  drops at night.   Intrusive thoughts and SI gone down some.   Atropine  drops not working well enough so not using it.   Doesn't stop drooling at night.   SE drooling and constipation.  Seeing GI Chronic dep and anxiety .  Some ongoing anhedonia.  Would like more relief.  No SZ and no stroke like SX Trazodone  works when needed.  Some dep cycling without pattern or pcpt. M helps with meds.  BC is still forgetful  Plan: Retry Vraylar 1.5 mg every other day  12/23/23 TC Pt reports he took Vraylar 4 days, W-F-M-W and he started having SI on Tuesday.  He reports sx were less yesterday and a little better today, has not taken it today. He said you told him he shouldn't have SI with it. No other sx reported. The suicidal thoughts were no worse than what he has reported previously, but said he is pretty good at gaging his sx and was afraid to continue the medication. Reports  he feels so bad all the time and he is anxious to feel better. Has FU 9/17.  Took it MW for 2 weeks without akathisia, but then intrusive worsening of SI    MD resp: DC Vraylar  01/18/24 appt noted:  Med: clozapine   300 HS, , clonazepam  0.5 mg BID, lithium  150 HS, trazodone  50 prn occ.  Keppra  500 BID.  B12 oral.  Atropine  drops at night.   Foul mood with irritability.  SI better off Vraylar.  Down bc tried so many things that haven't worked.   Gets out and does things but doesn't really want to do it.  Not much to do during the week and just lies around.  Does not do many chores.   Anhedonia and ex not doing Ham radio as in past.   FU neurology in Oct and will disc memory concerns. Still feels need for mother to help with meds.  Plan: trial Fanapt .  02/15/24 appt noted:  Med: Fanapt  4 mg HS, clozapine   300 HS, , clonazepam  0.5 mg BID, lithium  150 HS, trazodone  50 prn occ.  Keppra  500 BID. B12 oral.  Atropine  drops at night.   No better with dep.  Not as many intrusive thoughts but still down.  No clear change in anxiety. SE No lightheadedness, akathisia. Minimal napping.  Sleep 6 hours or more at night Sees Dr. Maree neuro next week.  Residual stroke sx speech and memory.  Rarely needing trazodone .   No other SE issues.   Still will tend to ruminate on old relationship, causes anxiety though no contact with her. Plan: No effect with Fanapt  4 mg nightly.   Fanapt  Increase to 8 mg nightly for 2 weeks.   then if well tolerated but no benefit increase to  12 mg nightly.    03/21/24 appt noted: Med: clozapine   300 HS, , clonazepam  0.5 mg BID, lithium  150 HS, trazodone  50 prn occ.  Keppra  500 BID. B12 oral.  Atropine  drops at night.   Increased Fanapt  to 12 mg HS for 2 weeks No better and no worse.  Still doesn't feel like he enjoys things; anhedonia.  Does not get the joy out of things.   No SE with it re: Fanapt . Plan: No effect with Fanapt   12 mg nightly.  But tolerated. But first,  increase Fanapt  to 18 mg nightly for 1 week (12 + 6 mg), Then increase Fanapt  to 20mg  nightly (12 + 8 mg ) for 1 week.   If no benefit then wean off it by dropping to 12 mg 1 night, then reduce to 8 mg 1 night, then reduce to 4 mg 1 night, then reduce to 2 mg 1 night, then stop it. If this fails we will retry lamotrigine  bc never used in combo with clozapine .  04/18/24 appt noted:   Med: clozapine   300 HS, , clonazepam  0.5 mg BID, lithium  150 HS, trazodone  50 prn occ.  Keppra  500 BID. B12 oral.  Atropine  drops at night.   Increased Fanapt  to 12 mg HS for 6 weeks Wanted to take Fanapt  20 mg longer.  He called and asked for 20 mg Fanapt  longer.   Doesn't want to take anything he has to take more than twice daily. But willing to do so. No SE no benefit with current meds. No change in chronic dep.  Anhedonia.   Still quite a bit of anxiety.   Sleep is good.    Decided more even anxiety benefit with clonazepam  vs lorazepam .  Anxiety less on clonazepam .  Psych med hx extensive including ECT and  Risperidone  Maxed out risperidone  8 mg without benefit for intrusive thoughts  Zyprexa  30 Latuda 80 which caused akathisia,  Vraylar 1.5 every other day for 2 weeks; worsening intrusive thoughts,  Rexulti, aripiprazole 20 mg with akathisia,  Seroquel  1000 mg,  InVega, Geodon , Saphris with side effects, symbyax, Fanapt   4 HS .  Caplyta  SE and markedly worse. Clozapine  started end of March 2024; highest dose 600 highest dose lithium  1200 SE,   lamotrigine  300 mg, Depakote 2000 mg, Tegretol, Trileptal and several of these in combinations, gabapentin , Keppra   N-acetylcysteine, Nuedexta,     Belsomra with no response,  Lunesta no response, trazodone  200 mg,  Xanax , clonazepam , lorazepam  less sedation. Buspirone  NR Clonidine  SE with 2 trials  Viibryd 40 mg for 3 months with diarrhea,  protriptyline with side effects,  Trintellix 20 mg,  Parnate 50 mg with no response,   Emsam 12 mg for 2 months,    imipramine,  venlafaxine,   bupropion side effects,  Lexapro 20 mg, sertraline, paroxetine , Deplin, fluoxetine  80,  fluvoxamine  150 agitated; retrial ? Sz related.   Auvelity NR at one daily.  SE BID  methylphenidate 60 mg,  Vyvanse, Concerta, strattera, , modafinil,  Memantine  worsening SI? Donepezil , complained of HI, Exelon  3 BID rivastigmine  DT worsening SI  pramipexole,  amantadine ,  Patient prone to akathisia.  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 Neuro [facility] Dr. IZORA Fairly, [facility] in progress.   EEG on 06/03/23 bc recent sz 08/25/23 appt Dr. 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 No breakthrough seizures since last visit. Currently on levetiracetam  500 mg twice daily. Requests a once-daily regimen. Epileptiform discharges necessitate continued anti-seizure medication to mitigate recurrent seizure risk. Discharges are insufficient to cause psychiatric issues. Levetiracetam  XR offers once-daily dosing convenience without altering the total daily dose. - Switch to levetiracetam  1000 mg XR once daily, to be taken at nighttime.     Review of Systems:  Review of Systems  Constitutional:  Positive for appetite change and fatigue.  Cardiovascular:  Negative for chest pain and palpitations.  Gastrointestinal:  Positive for constipation.  Musculoskeletal:  Positive for back pain.  Neurological:  Positive for dizziness and tremors. Negative for weakness.       Fidgety  Psychiatric/Behavioral:  Positive for decreased concentration and dysphoric mood. Negative for agitation, behavioral problems, hallucinations, self-injury, sleep disturbance and suicidal ideas. The patient is nervous/anxious. The patient is not hyperactive.     Medications: I have reviewed the patient's current medications.  Current Outpatient Medications  Medication Sig Dispense Refill    acetaminophen  (TYLENOL ) 500 MG tablet Take 1,000 mg by mouth every 6 (six) hours as needed for moderate pain.     atropine  1 % ophthalmic solution PLACE 2 DROPS UNDER THE TONGUE THREE TIMES PER DAY AS NEEDED AS DIRECTED 5 mL 2   cholecalciferol  (VITAMIN D3) 25 MCG (1000 UNIT) tablet Take 1,000 Units by mouth daily.     clonazePAM  (KLONOPIN ) 0.5 MG tablet Take 1 tablet (0.5 mg total) by mouth 2 (two) times daily. 60 tablet 3   cloZAPine  (CLOZARIL ) 100 MG tablet Take 3 tablets (300 mg total) by mouth at bedtime. 270 tablet 0   Cyanocobalamin  (VITAMIN B-12 IJ) Inject as directed every 30 (thirty) days.     esomeprazole (NEXIUM) 40 MG capsule Take 40 mg by mouth daily.     fenofibrate  (TRICOR ) 145 MG tablet Take 145 mg by mouth daily.     Iloperidone  12 MG TABS Take 1 tablet (12 mg total) by mouth 2 (two) times daily. 60 tablet 0   lamoTRIgine  (LAMICTAL ) 25 MG tablet Take 1 tablet (25 mg total) by mouth daily for 14 days, THEN 2 tablets (  50 mg total) daily for 14 days, THEN 4 tablets (100 mg total) daily for 14 days. 84 tablet 0   levETIRAcetam  (KEPPRA ) 500 MG tablet Take 1 tablet (500 mg total) by mouth 2 (two) times daily. 60 tablet 0   levothyroxine  (SYNTHROID , LEVOTHROID) 75 MCG tablet Take 75 mcg by mouth daily before breakfast.     LINZESS  290 MCG CAPS capsule Take 290 mcg by mouth daily.     lithium  carbonate 150 MG capsule Take 1 capsule (150 mg total) by mouth at bedtime. 90 capsule 1   metFORMIN  (GLUCOPHAGE -XR) 500 MG 24 hr tablet Take 500 mg by mouth 2 (two) times daily with a meal.     sildenafil  (VIAGRA ) 100 MG tablet Take 1 tablet (100 mg total) by mouth daily as needed for erectile dysfunction. 30 tablet 6   tamsulosin  (FLOMAX ) 0.4 MG CAPS capsule Take 1 capsule (0.4 mg total) by mouth daily. 90 capsule 3   traZODone  (DESYREL ) 50 MG tablet Take 1 tablet (50 mg total) by mouth at bedtime as needed for sleep. 90 tablet 0   No current facility-administered medications for this visit.    Medication Side Effects: Other: mild sleepiness.  Occ twitches.\, tremor  Allergies:  Allergies  Allergen Reactions   Meloxicam Other (See Comments)    Dizziness   Zyprexa  [Olanzapine ] Other (See Comments)    Akathesia   Nsaids Other (See Comments)    Hallucinations   Ibuprofen Other (See Comments)    Can not take because taking lithium    Prednisone Other (See Comments)    Can't sleep    Wellbutrin [Bupropion] Other (See Comments)    Suicidal thoughts    Past Medical History:  Diagnosis Date   ADHD (attention deficit hyperactivity disorder)    Anemia    Anxiety    Bipolar disorder (HCC)    BPH (benign prostatic hyperplasia)    Chronic kidney disease, stage 3b (HCC)    Constipation    DDD (degenerative disc disease), cervical    Depression    Deviated septum    ED (erectile dysfunction)    GERD (gastroesophageal reflux disease)    Graves disease    History of hiatal hernia    Hypertension    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    Hypothyroidism    Pneumonia    PONV (postoperative nausea and vomiting)     Family History  Problem Relation Age of Onset   Prostate cancer Neg Hx    Bladder Cancer Neg Hx    Kidney cancer Neg Hx     Social History   Socioeconomic History   Marital status: Single    Spouse name: Not on file   Number of children: Not on file   Years of education: Not on file   Highest education level: Not on file  Occupational History   Not on file  Tobacco Use   Smoking status: Former    Current packs/day: 0.00    Types: Cigarettes    Quit date: 2010    Years since quitting: 15.9    Passive exposure: Past   Smokeless tobacco: Former    Types: Associate Professor status: Never Used  Substance and Sexual Activity   Alcohol use: Not Currently   Drug use: Never   Sexual activity: Yes  Other Topics Concern   Not on file  Social History Narrative   Not on file   Social Drivers of Health  Tobacco Use:  Medium Risk (04/18/2024)   Patient History    Smoking Tobacco Use: Former    Smokeless Tobacco Use: Former    Passive Exposure: Past  Programmer, Applications: Low Risk  (11/08/2023)   Received from [facility]   Overall Financial Resource Strain (CARDIA)    Difficulty of Paying Living Expenses: Not very hard  Food Insecurity: No Food Insecurity (11/08/2023)   Received from [facility]   Epic    Within the past 12 months, you worried that your food would run out before you got the money to buy more.: Never true    Within the past 12 months, the food you bought just didn't last and you didn't have money to get more.: Never true  Transportation Needs: No Transportation Needs (11/08/2023)   Received from [facility] - Transportation    In the past 12 months, has lack of transportation kept you from medical appointments or from getting medications?: No    Lack of Transportation (Non-Medical): No  Physical Activity: Not on file  Stress: Not on file  Social Connections: Not on file  Intimate Partner Violence: Not At Risk (04/17/2023)   Humiliation, Afraid, Rape, and Kick questionnaire    Fear of Current or Ex-Partner: No    Emotionally Abused: No    Physically Abused: No    Sexually Abused: No  Depression (PHQ2-9): Not on file  Alcohol Screen: Low Risk (04/17/2023)   Alcohol Screen    Last Alcohol Screening Score (AUDIT): 0  Housing: Low Risk  (11/08/2023)   Received from [facility]   Epic    In the last 12 months, was there a time when you were not able to pay the mortgage or rent on time?: No    In the past 12 months, how many times have you moved where you were living?: 0    At any time in the past 12 months, were you homeless or living in a shelter (including now)?: No  Utilities: Not At Risk (11/08/2023)   Received from [facility]   Epic    In the past 12 months has the electric, gas,  oil, or water company threatened to shut off services in your home?: No  Health Literacy: Not on file    Past Medical History, Surgical history, Social history, and Family history were reviewed and updated as appropriate.   Please see review of systems for further details on the patient's review from today.   Objective:   Physical Exam:  There were no vitals taken for this visit.  Physical Exam Constitutional:      General: He is not in acute distress.    Appearance: He is well-developed.  Musculoskeletal:        General: No deformity.  Neurological:     Mental Status: He is alert and oriented to person, place, and time.     Cranial Nerves: No dysarthria.     Motor: Tremor present.     Coordination: Coordination normal.     Comments: mild tremor  Psychiatric:        Attention and Perception: Attention and perception normal. He does not perceive auditory or visual hallucinations.        Mood and Affect: Mood is anxious and depressed. Affect is not labile, blunt, angry or tearful.        Speech: Speech normal. Speech is not slurred.  Behavior: Behavior normal. Behavior is not agitated or slowed. Behavior is cooperative.        Thought Content: Thought content is not delusional. Thought content does not include homicidal or suicidal ideation. Thought content does not include suicidal plan.        Cognition and Memory: Cognition normal. He exhibits impaired recent memory.        Judgment: Judgment normal.     Comments: Insight fair Chronically talkative .directable. But chronically mildly pressured but less so  Easily confused. He is chronically anxious  .  No manic signs noted. Anhedonia ongoing Intrusive obsessions episodic HI/SI are better since this year but not gone and do cycle Alert and oriented.      Lab Review:     Component Value Date/Time   NA 144 04/17/2023 0314   NA 135 10/20/2022 1123   NA 142 03/20/2014 0514   K 4.4 04/17/2023 0314   K 4.1  03/20/2014 0514   CL 109 04/17/2023 0314   CL 112 (H) 03/20/2014 0514   CO2 28 04/17/2023 0314   CO2 26 03/20/2014 0514   GLUCOSE 92 04/17/2023 0314   GLUCOSE 99 03/20/2014 0514   BUN 16 04/17/2023 0314   BUN 22 10/20/2022 1123   BUN 7 03/20/2014 0514   CREATININE 1.43 (H) 04/17/2023 0314   CREATININE 0.78 03/20/2014 0514   CALCIUM  8.7 (L) 04/17/2023 0314   CALCIUM  9.5 03/20/2014 0514   PROT 6.5 04/14/2023 1047   PROT 7.3 03/18/2014 1459   ALBUMIN 3.8 04/14/2023 1047   ALBUMIN 3.2 (L) 03/18/2014 1459   AST 21 04/14/2023 1047   AST 16 03/18/2014 1459   ALT 10 04/14/2023 1047   ALT 16 03/18/2014 1459   ALKPHOS 39 04/14/2023 1047   ALKPHOS 105 03/18/2014 1459   BILITOT 0.3 04/14/2023 1047   BILITOT 0.3 03/18/2014 1459   GFRNONAA 56 (L) 04/17/2023 0314   GFRNONAA >60 03/20/2014 0514   GFRAA 54 (L) 04/18/2019 1612   GFRAA >60 03/20/2014 0514       Component Value Date/Time   WBC 5.5 04/11/2024 1000   WBC 6.5 04/21/2023 0946   RBC 4.12 (L) 04/11/2024 1000   RBC 3.92 (L) 04/21/2023 0946   HGB 13.0 04/11/2024 1000   HCT 38.9 04/11/2024 1000   PLT 375 04/11/2024 1000   MCV 94 04/11/2024 1000   MCV 89 03/20/2014 0514   MCH 31.6 04/11/2024 1000   MCH 30.9 04/21/2023 0946   MCHC 33.4 04/11/2024 1000   MCHC 32.7 04/21/2023 0946   RDW 12.2 04/11/2024 1000   RDW 12.5 03/20/2014 0514   LYMPHSABS 2.4 04/11/2024 1000   LYMPHSABS 3.2 03/20/2014 0514   MONOABS 0.4 04/21/2023 0946   MONOABS 1.0 03/20/2014 0514   EOSABS 0.5 (H) 04/11/2024 1000   EOSABS 0.4 03/20/2014 0514   BASOSABS 0.1 04/11/2024 1000   BASOSABS 0.1 03/20/2014 0514    Lithium  Lvl  Date Value Ref Range Status  10/13/2023 0.2 (L) 0.5 - 1.2 mmol/L Final    Comment:    A concentration of 0.5-0.8 mmol/L is advised for long-term use; concentrations of up to 1.2 mmol/L may be necessary during acute treatment.                                  Detection Limit = 0.1                           <  0.1 indicates None  Detected    08/12/21 lithium  level 0.9 on 450 mg.  lithium  level Sept 0.8.   lithium  level July 27, 2018 was normal at 1.0.   Lithium  level [facility] October 03, 2018 = 1.2. Said he got lithium  level at [facility] as requested.  Labs not in Epic.  Recent lipids ok except higher TG than usual.  Normal A1C.  11/10/22 Checked clozapine  level on 400 mg HS= cloz 733+norclozapine 294 = total 1027  06/07/23  Vitamin B12 >300 pg/mL 848  On oral supplement  Assessment: Plan:    Lawson was seen today for follow-up, depression, anxiety and medication problem.  Diagnoses and all orders for this visit:  Severe bipolar I disorder, current or most recent episode depressed (HCC) -     lamoTRIgine  (LAMICTAL ) 25 MG tablet; Take 1 tablet (25 mg total) by mouth daily for 14 days, THEN 2 tablets (50 mg total) daily for 14 days, THEN 4 tablets (100 mg total) daily for 14 days. -     Iloperidone  12 MG TABS; Take 1 tablet (12 mg total) by mouth 2 (two) times daily.  GAD (generalized anxiety disorder)  Panic disorder with agoraphobia  Other obsessive-compulsive disorders  Insomnia due to mental condition  Mild cognitive impairment  Low serum vitamin B12  Long term current use of clozapine   Sialorrhea  Seizure (HCC)      60 min appt with pt  needs a lot of time   .   Seen alone   Chronic TR bipolar mixed and chronic anxiety.  He usually has mixed bipolar symptoms which we have not been able to completely eliminate.  See long list of meds tried.   He is  chronically unstable .  Mood is relatively stable except for the severe intrusive, ego-dystonic persistent obsessive HI/SI without intent or plan.  No better no worse.  Residual anhedonia. Hx chronic intrusive ego dystonic thoughts HI/SI.  More like obsessions than true HI/SI.   No recent mania.  Chronic dep and anxiety.  But is a little better since being on Keppra .   Psych hosp 08/02/22 with SI and acute kidney injury and elevated lithium . Processed  this and disc goal of trying to stop lithium  but risk of worsening SI.  He has chronic SI and there's risk of this worsening off lithium .  Lithium  and clozapine  are the most effective meds for SI.    hosp for SZ episodes.  Fluvox stopped and clozapine  reduced to 300 mg HS.   Was RX risperidone  but he stopped that after DC.  It was ineffective in at least 2 trials.  We discussed the short-term risks associated with benzodiazepines including sedation and increased fall risk among others.  Discussed long-term side effect risk including dependence, potential withdrawal symptoms, and the potential eventual dose-related risk of dementia.  But recent studies from 2020 dispute this association between benzodiazepines and dementia risk. Newer studies in 2020 do not support an association with dementia.  Unfortunately due to the severity of set his symptoms polypharmacy is a necessity.    Lithium  being used bc chronic SI and death thoughts.  Counseled patient regarding potential benefits, risks, and side effects of lithium  to include potential risk of lithium  affecting thyroid  and renal function.  Discussed need for periodic lab monitoring to determine drug level and to assess for potential adverse effects.  Counseled patient regarding signs and symptoms of lithium  toxicity and advised that they notify office immediately or seek urgent medical attention if  experiencing these signs and symptoms.  Patient advised to contact office with any questions or concerns.  Reduced lithium  to 150 mg daily Dt hx toxicity Checked lithium  level and BMP and level 1.1 on 300 mg Daily so reduced again to 150 mg daily and level 0.4 05-20-2024 Call if death thoughts worsen or worsening SI.  Disc clozapine  and it's level..  Disc data showing less SI with it but he's had no changes.    Disc risk in detail including low WBC with complication, myocarditis.  Extensive discussion of CBC monitoring.  Disc going higher despite level and  checking ECG bc cardiac risk at higher doses.   Disc this again bc taking Wegovy might help him to tolerate it better.   clozapine  600 mg HS was Started 12/07/22.  Started clozapine  08/05/22. 11/10/22 Checked clozapine  level on 400 mg HS= cloz 733+norclozapine 294 = total 1027 Reduced to 300 mg HS at the hospital. CBC every 12 weeks, now that on it over a year.  And DT recent FDA changes in recommendation.  Recent labs 12/25 normal.   Clozapine  helps mania but not much with depression.  Use the atropine  drops prn 2-4 on each side.  He says he doesn't think it helps.  Did he take duloxetine?  Might help depression more.   Consider retry pramipexole off label but tricky with clozapine .   Consider Qelbree.  Or retry lamotrigine  with clozapine .  Answered his questions about ketamine and FDA indications.  He understands it is off label.   Switched lorazepam  to clonazepam  0.5 mg BID  working better and more calming and lasts longer. M administers  Discussed safety plan at length with patient.  Advised patient to contact office with any worsening signs and symptoms.  Instructed patient to go to the [facility] emergency room for evaluation if experiencing any acute safety concerns, to include suicidal intent.  He commits to safety. His HI/SI that he complains about are intrusive ego-dystonic obsessions and not true HI SI in that there's no desire or intent ever.  Off GLP-1.  Has had weight gain from meds as a contributor. Lost 45# with Wegovy and 150#.    Needs to keep up activity as much as possible for depression.  For severe constipation seeing GI  Historically he has not done well with serotonergic medicines which would typically be used for obsessive thoughts.  But we are running out of options.   Repeat ECT not good option bc being treated for SZ.   Disc memory concerns and mother's memory concerns bc had it before.  Disc counseling.  [email or web address] for therapist.  Most effective  therapist will need to be expert in CBT.  He has one now.  This waxes and wanes.  I'm suggesting lamotrigine  in  combination with clozapine  and that has not been done.  Also Dr. Maree responded to my note about it and he agrees.  Increase Fanapt  to 8 mg in the AM and continue 12 mg nightly for 1 week, then increase Fanapt  to 12 mg twice daily until the next appt. Start lamotrigine  for for depression 25 mg tablets , 1 daily for 2 weeks, then 2 tablets daily for 2 weeks then 4 tablets daily. retry lamotrigine  bc never used in combo with clozapine .  Disc again that we are approaching the end of the options that I have for treatment and I encourage him to seek consultation elsewhere unless he wants to accept the status quo.  He has sought second opinion  before and at this point I would rec a transfer of care rather than just a second opinion.  Disc SE  FU 4 weeks   Lorene Macintosh, MD, DFAPA  Future Appointments  Date Time Provider Department Center  05/16/2024  1:00 PM Cottle, Lorene KANDICE Raddle., MD [facility] None  06/19/2024  1:30 PM Cottle, Lorene KANDICE Raddle., MD [facility] None  07/16/2024  1:30 PM Cottle, Lorene KANDICE Raddle., MD [facility] None  08/16/2024  1:30 PM Cottle, Lorene KANDICE Raddle., MD [facility] None  12/26/2024  2:30 PM Francisca Redell BROCKS, MD [facility] None    No orders of the defined types were placed in this encounter.      -------------------------------

## 2024-04-27 ENCOUNTER — Other Ambulatory Visit: Payer: Self-pay | Admitting: Psychiatry

## 2024-04-27 DIAGNOSIS — F314 Bipolar disorder, current episode depressed, severe, without psychotic features: Secondary | ICD-10-CM

## 2024-04-27 DIAGNOSIS — F411 Generalized anxiety disorder: Secondary | ICD-10-CM

## 2024-04-27 DIAGNOSIS — F428 Other obsessive-compulsive disorder: Secondary | ICD-10-CM

## 2024-04-27 DIAGNOSIS — F4001 Agoraphobia with panic disorder: Secondary | ICD-10-CM

## 2024-05-16 ENCOUNTER — Ambulatory Visit (INDEPENDENT_AMBULATORY_CARE_PROVIDER_SITE_OTHER): Admitting: Psychiatry

## 2024-05-16 ENCOUNTER — Encounter: Payer: Self-pay | Admitting: Psychiatry

## 2024-05-16 DIAGNOSIS — E538 Deficiency of other specified B group vitamins: Secondary | ICD-10-CM | POA: Diagnosis not present

## 2024-05-16 DIAGNOSIS — F4001 Agoraphobia with panic disorder: Secondary | ICD-10-CM | POA: Diagnosis not present

## 2024-05-16 DIAGNOSIS — F428 Other obsessive-compulsive disorder: Secondary | ICD-10-CM | POA: Diagnosis not present

## 2024-05-16 DIAGNOSIS — F314 Bipolar disorder, current episode depressed, severe, without psychotic features: Secondary | ICD-10-CM

## 2024-05-16 DIAGNOSIS — F5105 Insomnia due to other mental disorder: Secondary | ICD-10-CM | POA: Diagnosis not present

## 2024-05-16 DIAGNOSIS — G3184 Mild cognitive impairment, so stated: Secondary | ICD-10-CM | POA: Diagnosis not present

## 2024-05-16 DIAGNOSIS — Z79899 Other long term (current) drug therapy: Secondary | ICD-10-CM

## 2024-05-16 DIAGNOSIS — F411 Generalized anxiety disorder: Secondary | ICD-10-CM | POA: Diagnosis not present

## 2024-05-16 MED ORDER — FANAPT 4 MG PO TABS: ORAL_TABLET | ORAL | 0 refills | Status: AC

## 2024-05-16 MED ORDER — LAMOTRIGINE 100 MG PO TABS: ORAL_TABLET | ORAL | 1 refills | Status: DC

## 2024-05-16 NOTE — Patient Instructions (Addendum)
 Check on availability of discounted GLP-1 meds as of January 2026 by looking on [employer]  Wean off Fanapt  by reducing to 2 of the 4 mg tablets twice daily for 1 week,  Then reduce to 1 of the 4 mg tablets twice daily for 1 week then stop it.   increase lamotrigine  to 100 mg daily for 2 weeks then increase to 150 mg (or 1 and 1/2 of the 100 mg tablets) daily. [name]

## 2024-05-16 NOTE — Progress Notes (Signed)
 Albert [name] [id number] 1966/03/01 59 y.o.    Subjective:   Patient ID:  Albert [name] is a 59 y.o. (DOB [birth year]) male.  Chief Complaint:  Chief Complaint  Patient presents with   Follow-up   Depression   Anxiety   Medication Reaction      Albert [name] presents to the office today for follow-up of mixed bipolar, anxiety and still grieving stress of breakup.  He requires frequent follow-up because of long-term symptoms.  At visit October 25, 2018.  We made several medicine changes because of his concerns about sleep and other issues.  We increased olanzapine  back to 7.5 mg bc not sleeping as well at 5 mg.  He has to have the prescription for quetiapine  written for up to 3 tablets at night in case insomnia is worse because insomnia makes his mood disorder so much worse.  We discussed that was above the usual max but the request was granted given his treatment resistant status. We also reduce lithium  from 900 mg daily to 750 mg daily to try to reduce tremor and muscle twitches.  At  visit March 28, 2019.  Fluoxetine  was increased to 40 mg daily.   Early January 2021 increased to 60 mg daily.  No SE.  seen June 28, 2019.  No further meds were changed except olanzapine  was increased back to 10 mg daily to see if if he could get additional benefit per his request.  Covid vaccinated.  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  Then had cholecystectomy.  April 2021 appt with the following noted: 2 episodes night sweats lately.  Memory has been very bad lately.  Repeatedly asks mother questions. Still  tend to stay in his room and his bed.  Still has rapid cycling mood swings.  Maybe some better with increase in olanzapine  to 10 and tolerating itl.  Would like to get out more but can't DT Covid.  Can perform necessary chores.  Will get out of the house when he can.  No change in death thoughts and anxiety in intensity but is better with frequency.  Usually comes and goes in waves but more persistent.   Consistent with meds.  Ativan  not helping anxiety very dramatically but he's not sure.  Fidgety.  No trigger other than still grieving relationship and can't get it out of his head.  Poor energy, concentration and more forgetful. In bed more and less active.  Sleep ok lately which is unusual.  Can concentrate on financial matters and stock market at times.  Tolerating meds.  Still some intrusive SI without reason. Less frequent obsessive thoughts about broken relationship and has been doing this for months.  Can't let it go.  Loop.   No unusual stress even with the family who is supportive. Likes the benefit that Zyprexa  gives. No sleepwalking nor falling nor odd behavior.  No history of sleep walking.  He understands this may recur with an increase.  Also wants option to rarely take extra quetiapine  300 for sleep prn. Plan:  Try to reduce lorazepam  if possible.  10/22/2019 appointment with the following noted: Continues fluoxetine  60, lithium  600 mg, lorazepam  2 mg AM and HS and 1 mg midday, olanzapine  10, quetiapine  600 mg HS. Still anxious chronically and including driving in crowded spaces.  Doesn't thing Ativan  helps as well as Xanax  but less cognitive problems. Sleep is not as good.  Occ EFA.  More EMA and wanting to do things in the middle of  the night but this is not typical.  Wonders about why that happens.  Some napping.   Tolerating meds.  Asks about weight loss meds.  Disc this in detail.   Plan no changes except OK meclizine  prn vertigo.  02/11/20 appt with the following noted: Able to gradually reduce lorazepam  to 1 mg AM and HS. More depressed over time and less interested in things and less interest in going out but does with his parents. Has reduced from 800 to 600 mg HS with some awakening but usually able to go back to sleep.[name]  Spending a good amount of time in bed bc watches TV in bedroom.  Parents watch TV in different part of the house.  No mood swings he notices.   Concerns  about weight gain about 200#. Likes olanzapine 's benefit for sleep. Plan: Trial for TRD to  Increase fluoxetine  to 80 mg daily  to use the combo with olanzapine  for TR bipolar depression.   04/21/20 appt with following noted: I thought in beginning some benefit with fluoxetine .  Lifelong negative thinking continues.   Not much bipolar.  Reads a lot on bipolar.   Still tired and anxious a little more may be seasonal.  No familial stressors.  Tends to lay down in afternoon.  Anxiety not over anything in particular.   No SE with fluoxetine . Took meclizine  prn.  Easily motion sick.    Asks questions about newer drugs for bipolar like Caplyta . Plan:  No med changes  06/30/20 appt noted: Tired of wearing masks.  Vaccinated.  Asked questions about when this will end with mask mandate.  Mood up and down some since here and getting up 2-3 times.  Disc awakening problems.  Trying to do better bc night eating some.   Mood is about average today so far.  Albert [name] out with friends for breakfast.  Recognizes activity helps mood.   3 days in a row napped a lot in the afternoon.   Bed is a comfort place.  Gets bored and hard to motivate.  Tries to stay away from night eating and spending.   More depressed when alone and wonders about med changes. Plan: Failed response to olanzapine  + fluoxetine  for TR bipolar depression.   Reduce fluoxetine  to 1 daily and reduce olanzapine  to one half nightly for 10 days and then stop it. Then start Caplyta  1 daily  08/27/2020 appointment with the following noted: Patient decompensated with the above switch to Caplyta  with intrusive suicidal thoughts and had to be hospitalized for psychiatric reasons.  Multiple phone calls with family members since that time.  Spoke with the psychiatric nurse practitioner with the decision to restart olanzapine  in place of the Caplyta  given the patient was more stable while on the combination of Seroquel  and olanzapine  than with the  Caplyta . Caplyta  triggered HI/SI and depressed and confused on it, and agitated and still has some of it now. No akathisia. Frustrated with lack of therapy at the hospital.   Hydroxyzine  didn't help jitteriness.  Feels some better.  Fleeting SI & HI but not obsessive like it was prior to hospitalization.  Feels a little amped up and nervous.  Scared of what could happen.  Apprehensive being here talking about things. 5-6 hours sleep last night and would like to have more. At mom and dad's house right now and up more in the day. Inadvertently stopped lorazepam  abruptly by mistake likely contributing to shakiness. Still some memory issues.  Trying to stay out of  bed when watching TV.  Some awakening but managing. Occ fleeting SI and distracts himself.   Always spends a lot of time just laying around.   Can have anxiety for no reason like coming here, and varies in intensity without pattern.      Less panic than in the past.  Some chronic depression and hyperactivity and loudness and hyperverbal.  Usually back to sleep.  Total 6 hours but some napping, awakens 2-4 times nightly but back to sleep..  Taking quetiapine  600 mg HS.  still lacks interest and motivation.  Can follow a TV show if interested. Plan: Restart lorazepam  1 mg in the morning, 1 mg in the afternoon and 1 mg at night for anxiety Stop hydroxyzine   Increase olanzapine  to 1 and 1/2 of 10 mg tablets in evening  09/05/2020 appt noted: seen with parents today at his request Made med changes noted and tolerated the changes Better than I did last week. Now only fleeting HI/SI and no where near what it was.  Sleeping better.  Still not a lot of energy.  Anhedonia.  Less agitation.  A lot of anxiety all the time but it's helping.  Would like more energy and motivation.   Doesn't handle stress well. Parents note he's less shakey.  He's still staying with his parents.  Only driven once since this happened and F wants him to be able to drive and  function more independently.  M agrees he's better.  Memory is better but not normal.  Primarily STM problems No akathisia with olanzapine  this time.    Administering his own meds but mo watched. Slept 8 hours last night. Plan consider further reductions in quetiapine   10/17/20 appt noted: Stayed on Seroquel  600 HS and olanzapine  15. Olanzapine  seemed to do most for the sleep.  Also helping eliminated HI/SI for the most part.  Wants to try to increase it.  Depression is much better with less rumination also.  Fears akathisia at higher dose as in the past. Also on fluoxetine  40.   Plan: Reduce Seroquel  to 1 and 1/2 tablets at night Increase olanzapine  to 1 of the 15 mg tablet and 1/2 of the 5 mg tablet for 1 week,  Then, if tolerated increase the olanzapine  to 20 mg or 1 of the 15 mg tablets and 1 of the 5 mg tablets. If possible then also reduce quetiapine  to 1 tablet at night.  11/05/2020 appointment with the following noted: Up to olanzapine  20 mg hs for 3 days.  Reduced Serooquel to 450 mg HS.  Didn't try to go lower. Had hiatal hernia and umbilical hernia surgery recently with no problem.   No akathisia so far. Sleep ok with meds so far changes.  No mood changes yet but overall likes the smoothness of olanzapine  and helping sleep without knociing him out. Still some occ HI/SI, he thinks bc of anxiety generally.  Easily anxious. Plan: Continue olanzapine  20 mg nightly longer to give it time to help mood sx reduce quetiapine  to 1 tablet at night to minimize polypharmacy and reduce risk of akathisia.  01/07/21 appt noted: Able to reduce quetiapine  300 mg HS ok with decent sleep but still wakes a lot.  No AM hangover.  Don't have a lot to do.  Can enjoy going out and doing things.  But doesn't do it longterm.  Family is doing good.  Father started year 76 at his job.   Tolerating the meds well.  May wake more with quetiapine   reduction. Lost 10#. Wonders if could try other meds he didn't  tolerate before bc now can tolerate olanzapine  and couldn't tolerate it in the past DT akathisia. Mood never great but can swing into depresssion without reason.   Plan: Continue olanzapine  20 mg nightly longer to give it time to help mood sx reduce quetiapine  to 1/2 of the 300 mg  tablet at night to minimize polypharmacy and reduce risk of akathisia.  02/02/2021 appointment with the following noted: Tried reduced quetiapine  to 150 mg and after a week had withdrawal sx including jittery and SI and he increased to 250 mg daily.  Felt better after increasing to 300 mg daily and last week reduced to 250 mg daily.  After increase quetiapine  felt better in a few days.  Seems more alert with less quetiapine  but can't tolerate a quick withdrawal.   Some awakening.  Plan: Reduce quetiapine  by 50 mg every 2-4 weeks.  03/09/2021 phone call: Pt called and said that he is weaning off the seroquel . He was down to 200 mg of the seoquel and he started having withdrawal symptoms and sucidal thoughts. He went back up to 250 mg because he knew he was good on that.    Pt stated he starting back taking 250 mg on Saturday due to the symptoms he was having.Now he is no longer having suicidal thoughts but still very jittery. MD: He may just need to taper slower than most people.  If he can tolerate the 250 mg Seroquel  with the jitteriness it should get better over the next 1 to 2 weeks.  Then he can try going down again by 50 mg at a time.  He probably needs to go down by just 50 mg every 2 to 4 weeks and wait till he fully adjusts to each reduction before he reduces again.  03/17/21 appt noted: Hernia surgery 2 weeks ago. Seroquel  250 mg for a month then 200 mg daily and had SI and increased again to 300 mg daily.  Would love to get off it.  Withdrawal triggers intrusive HI/SI but otherwise doesn't have them No SE. Sleep is OK but not enough REM sleep.  Satisfied with other meds.   No current HI/SI.  No paranoia.  Still  has anxiety and taking Ativan  every 6 hours.  It works but doesn't cure it. Chronic anxiety and ask about ketamine for chronic depression. Plan: Because he had WD reducing from 250 to 200 mg daily will have to go slowly. Reduce quetiapine  by 25 mg every 2-4 weeks. Reduce Seroquel  to 250 mg for 2 weeks, Then reduce to 225 mg for 2 weeks, Then reduce to 200 mg for 2 weeks, Then reduce to 175 mg for 2 weeks, Then reduce to 150 mg Also: Buspirone  30 mg tablets for anxiety Start 1/3 tablet twice daily for 1 week Then increase to two thirds twice daily for 1 week Then increase to 1 tablet twice daily  04/15/2021 appointment with the following noted: Reduced Seroquel  to 200 mg HS and increased buspirone  to 30 mg BID STM px and wants to try to reduce Ativan  to see if it is better.  But fears with drawal sx. Parents complain about his memory. Less HI and SI since here. Less depressed. Plan: Buspirone  30 mg tablets for anxiety Start 1/3 tablet twice daily for 1 week Then increase to two thirds twice daily for 1 week Then increase to 1 tablet twice daily Continue olanzapine  20 mg nightly longer to give  it time to help mood sx Because he had WD reducing from 250 to 200 mg daily will have to go slowly. Reduce quetiapine  by 25 mg every 2-4 weeks. Reduce Seroquel  to 250 mg for 2 weeks, Then reduce to 225 mg for 2 weeks, Then reduce to 200 mg for 2 weeks, Then reduce to 175 mg for 2 weeks, Then reduce to 150 mg Trial taper lorazepam : In hopes of improving short-term memory  04/30/2022 phone call: Complaining of withdrawal symptoms reducing lorazepam  from 0.5 mg 5 times a day to 0.5 mg 4 times daily.  05/21/2021 appointment with the following noted: Gotten onto buspar  30 BID and down on lorazepam  to 0.5 mg QID. Gotten down to Seroquel  200 mg HS A little restless every now and then but able to reduce lorazepam  for the last weeks. Is too inactive and lays in bed too much watching TV.   Can' t  tell effect of buspirone  bc of other changes.  Sleep with awakening with 6-7 hours. Fleeting SI without trigger or plan or intent. Depressed more than anxious. Enjoys watching stocks and investing. Plan: Trial taper lorazepam  gradually over time In hopes of improving short-term memory But for now continue lorazepam  0.5 mg QID Start clonidine  0.1 mg tablets one half at night for 3 nights, then 1/2 tablet twice daily for 3 nights, then one half in the morning and 1 tablet at night If clonidine  does not noticeably help anxiety call the office  05/27/2021 phone call initially complaining of homicidal and suicidal thoughts after taking 1 mg of clonidine .  He was told to stop the medication.  But then called back stating he was having homicidal and suicidal thoughts from withdrawal from the medication which was not logical.  He agreed it was not logical and stated on 05/29/2021 that he felt better  06/23/2021 appointment the following noted: No clonidine . Sunday panic attack eating out and then having SI.  Stayed with father to feel safer. Intrusive thoughts of hurting parents or others.  HI only when has SI.  Usually panic without SI.  Then had intrusive thoughts about hurting others.   Usually intrusive thoughts are brief and fleeting.    07/20/21  appt noted; More anxious with less lithium .  More anxious in his body.  Not more depressed or irritable.  Sleep is the same.  More racing thoughts.  Chronic SI probably a litte worse but not unmanageable. Rduced lithium  last week to 300 mg daily. No recent change in lisinopril . Off the clonidine  for a week or so. Doesn't drink much water.  Drinks a lot of diet coke. No marked akathisia or RLS but does shake his leg DT anxiety. Worries about needing mor emedicine. Plan: Increase olanzapine  to 25 mg nightly bc more racing thoughts and anxiety with less lithium  DT severity of sx and difficulty getting off Seroquel  conside rincrease.  07/30/21 TC : CO  racing SI today and wonders about whether increased sx racing thoughts, hyperactivity, SI related to Reduced lithium  from 600 mg daily to 300 mg daily recently DT lithium  level 1.8.  could be related.    Plan: increase olanzapine  30 mg pm Today take lithium  600 mg now.  Tomorrow start lithium  CR 450 mg daily Continue olanzapine  30 mg PM until SI resolves unless akathisia returns.  Lorene Macintosh, MD, DFAPA  08/17/21 appt noted:  No akathisia with olanzapine  30 mg pm (about 3 weeks) and is sleeping better. Increased lithium  to 450 mg daily. Tolerating meds. Still racing  negative thoughts maybe some better.  Asked about it. SI come and go.   Anxiety about the same as depression.  No motivation unless has to do something. Plan: Reduce  fluoxetine  20 mg 1 daily in hopes racing thoughts and anxiety are better.  09/14/2021 appointment with the following noted: No difference noted with reduction fluoxetine  20 mg daily. Most of the time negative thoughts, ex what if I get in a wreck?  Frequent negative thoughts.   Still on lorazepam  0.5 mg TID.   Wonders about trying prior meds that caused akathisia bc no longer gets it with olanzapine  30 mg daily. Sleep is usually ok.  Taking Seroquel  200 mg HS.  Thinks the olanzapine  helps his sleep and doesn't want to stop it. Panic 1-2 times per month.  Then takes extra lorazepam . Plan: DC fluoxetine  20 mg 1 daily  Trial Auvelity off label for depression 1 in the AM for 1 week then 1 in AM and 1 with evening meal  10/15/2021 appointment with the following noted: Occ of getting confused as to day and went to church on a Thursday my mistake.   Had it happen a few months ago too.  Memory is terrible. Thinks he couldn't tolerate more Auvelity. Not a lot of benefit from [place] 1 daily. Still quite a bit of anxiety.   A little drowsy  and sleeps 7-8 hours at night. Word finding problems.  Lost wallet. Plan: DC  Auvelity  For [facility]: memantine  1/2 tablet in the  AM for 1 week, then 1/2 tablet twice daily, then 1/2 tablet in the AM and 1 tablet at night for 1 week then 1 tablet twice daily  10/22/2021 phone call complaining of additional stress asking to start new medication because of anxiety and more suicidal thoughts without intent or plan.  Reports taking more than 4 prescribed Ativan  a day recently.  Asking to increase olanzapine  which is already at 30 mg daily. MD response:We cannot go any higher in the dosage of olanzapine  that he is currently taking.  A lot of these suicidal thoughts are apparently anxiety driven.  Let us  have him resume taking paroxetine  which is one of the more effective medicines for anxiety.  I will send it in and the instructions will be paroxetine  20 mg tablets 1/2 tablet daily for 1 week then 1 tablet daily  11/16/21 appt noted: Multiple phone calls since she was here.  Complaining of suicidal thoughts from memantine  which was stopped. Started paroxetine  and up to 20 mg daily.  Continues olanzapine  30 mg, lithium  450 mg nightly, lorazepam  0.5 mg 4 times daily , And quetiapine  200 mg nightly. Questions about whether memantine  caused the neg SI really bc has them at times. When has SI often anxiety driven.  Will tend to ruminate on past relationship ended.   Edgy and irritable all the time but memantine  seemed to make it worse. Mind is getting better with less SI lately.   No SE with paroxetine .   Aggrivated by forgetfulness.  eXample, talked with someone about going to breakfast last night and then forgot to go this mornin.g. Plan prescribed donepezil  5 mg daily for cognitive complaints specifically forgetfulness  12/31/2021 appointment with the following noted: He had been prescribed donepezil  5 mg for cognitive complaints.  He called back reporting he felt it was causing homicidal thoughts and was instructed to stop it.  He has had these types of thoughts in the past.  He had no desire to act on  them. Discouraged. Ongoing  depression and reduced enjoyment and negative thoughts.  Can enjoy some things. A lot of anxiety chronically.  Trying to limit Ativan  to 5 of 0.5 mg daily.  Sometimes needs 2. Constipation for years.  02/03/22 appt noted: Depression makes LBP worse. Ongoing chronic depression. Tolerated rivastigmine  without triggering SI Chronic anxiety and Ativan  helps some. Occ panic but not usual.  Asks if any other meds coud be used. Plan: But for now continue lorazepam  0.5 mg QID, but use LED for cog reasons Increase rivastigmine  to 3 mg capsule 1 twice daily or 2 of the 1.5 mg capsules twice daily.  Call if you have problems with nausea Start dextromethorphan  1 capsule twice daily . Thi sis off label for depression based on MOA of Auvelity and use of paroxetine  to prolong half life of DM. Presence of paroxetine  may have been reason he didn't tolerate Auvelity brief trial before.  Will proceed slowly.  03/17/2022 appointment noted: Current psych meds: Rivastigmine  3 mg twice daily, quetiapine  200 mg nightly, paroxetine  40 mg daily, olanzapine  30 mg nightly, Namenda  10 mg daily, lorazepam  0.5 mg 4 times daily, lithium  CR 450 mg nightly, dextromethorphan  15 mg twice daily A lot of anxiety since here.  Some days taken more lorazepam  to 6-7 tablets daily.  No major triggers but anxious even about coming here and other normal things.  Cancelled trip to collect rocks with friend and cancelled the trip bc anxious.  Usually if can go he is ok.  Lots of SI ongoing Memory might be a little better. Asks how he responded to V[place]lar.  Will check paper chart.  03/24/22 TC:  CO SI worse and restilless with Rivastigmine  9 mg . MD response:  Stop the rivastigmine  since [place] appears to be having side effects from it.  He was having anxiety at the last appointment 2 and then we increased the dose.  This might be causing the anxiety to be worse. He has chronic suicidal thoughts without intent or plan.  However it appears  the thoughts are more intense with the rivastigmine      05/06/21 appt noted: Stopped rivastigmine  DT anxiety and worsening SI.  Was better the next day. This week had some SI.  Thinking about the prior relationship tends to lead to SI. Will take Ativan  when gets SI and it helps a little at 0.5 mg at a time. Plan: Consider fluvoxamine .  Yes this may help with obsessions on old GF and he failed other optioons. Start fluvoxamine  1/2 of 50 mg tablet and reduce paroxetine  to 1 and 1/2 of 20 mg tablets for 5 days, Then increase fluvoxamine  to 1 tablet daily and reduce paroxetine  to 1 tablet daily for 5 days, Then increase fluvoxamine  to 1-1/2 tablets daily and reduce paroxetine  to 1/2 tablet daily for 5 days, Then increase fluvoxamine  to 2 tablets daily and stop paroxetine    06/07/22 appt noted: Feels like the fluvoxamine  50 BID is too much.  On this a couple of weeks. Worries he might be worse with this with regard to SI.    He thinks it's worse with increase.   New neighbor who has a young boy. He worries that Georjean is triggering depression and intrusive SI/HI a couple of days later and lasts a little while. Has had anxiety turning into panic attacks.  This gets associated with depression and SI/HI.  These thoughts are intrusive and he doesn't want to act on them.  Can have HI even towards family  with whom he has no negative emotions.  Little things like a bump up in parking lot sets off his depression and anxiety and doesn't seem to handle things.   No sig akathisia.  In the past it's been continuous.   Awakens every 2-3 hours.  Usually goes back to sleep with meds. Has not had alcohol in 20 years and had some intrusive thoughts without drinking.   Plan: As soon as he feels comfortable increase fluvoxamine  to 50 mg AM and 100 mg PM for intrusive thoughts of HI/SI and obs on GF  07/06/22 appt noted: Increased fluvoxamine  to 150 mg daily and felt more agitated and some SI so reduced to 100 mg 4 days  ago and feels better so far.  Likes the fact it is good for anxiety and obs thoughts.  Wonders if there is a similar med.  Frustrated he can't get relief.  Some improvement in obs on exGF but not resolved. Continue other meds: lithium  CR 450 mg HS, lorazepam  0.5 mg QID, Memantine  10 BID, olanzapine  30 mg HS, quetiapine  200 mg HS Tingling and burning in legs at night but doesn't interfere with sleep. Got insurance to pay for [facility] and lost 13#.   No full panic lately. But some anxiety waves.   Still major problems with memory. Plan: Wean fluvoxamine  50 mg daily for a month then 1/2 daily for 2 weeks then stop it DT  NR.  Watch for more anxiety.  07/28/22 TC: Con Dawna DASEN, CMA  to Me     07/28/22  4:16 PM Note Mom called reporting patient has been confused, dizzy, and sluggish for the last 3 days. He is now staying with her. She had several questions in regards to this:   Lithium  level - she feels he needs one done, last result in Epic was 08/2021 that I see. She would like order sent to [facility] for tomorrow.   Patient is on Wegovy, has been for about a month, and she wonders if this could cause sx.   Patient has meclizine  and she is questioning if she should give that for his dizziness considering other sx.   She questions if perhaps he got up during the night and took additional medications, though doesn't know if this happened and if so what he took.    On 3/5 visit he was to wean fluvoxamine  to 50 mg qd for a month and then 1/2 tab for 2 weeks and then stop. Mom and pt unsure if he started the wean.    He has F/U 4/4.        Me  to Con Dawna DASEN, CMA   CC   07/28/22  5:44 PM Note Yes get lithium  level in the AM and don't take any lithium  tonight or in the morning before the blood test.     Georjean should not cause these sx except if he is losing wt quickly or is dehydrated it can cause lithium  toxicity and include these sx.  He needs to drink lots of water.   He should go  ahead and wean the fluvoxamine  as instructed in the last appt.   If the sx get worse, go to the ER.    Me   CC   07/29/22  6:49 PM Note RTC   Sx not as severe as when he had to go to hospital in the past.  Albert [name] got Lithium  level today.   Speech is not good .  Confused at times,  but better today.  A little tremor .  A little balance problems.  No falls.  No diarrhea Took lihtium 450 last night. Mo says he got up in the middle of the night and moved things around last night and he doesn't rmember.   No change in night time meds.   Gradually reducing fluvoxamine .   No extra lorazepam .     Spoke with mother who verifies the same.   No med changes but push fluids.   Lorene Macintosh, MD, DFAPA      08/05/22 appt : with parents Pt hosp 08/02/22 with acute kidney injury and somewhat elevated lithium  level with some confusion.  Spoke with psych NP at hosp and lithium  was decreased to 450 mg HS. No pending appt known with medical doctors about kidney function. Don't feel good and having HI and SI and feels he needs lithium  for these issues.  Doesn't feel he should have been discharged.   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 No added meds at night but has been confused at night and getting up multiple times and doing things he doesn't remember.  This concerns parents.  Parents been up at night with him and he doesn't make sense.  Still groggy in the am per mo for an hour.   Plan: Plan: Get lithium  level and kidney test ASAP Stop fluvoxamine  Reduce lithium  300 mg capsule 1 at night Monday night: reduce to olanzapine  to 20 mg at night (stop the 10 mg tablet) and start clozapine  prescription. Monday 4/15 : increase clozapine  to 100 mg at night and reduce quetiapine  to 1/2 of 200 mg tablet and continue olanzapine  20 mg each evening.  08/01/22 & 08/11/22 ED for HI and SI  08/12/22 TC mother:  Mother, Patsy called. Per note made by Harlene Franchot PIETY. He was referred to [facility].Reporting that he did go up  there last night. This morning they called her and told her to come pick him up. They told her he wasn't having homicidal or suicidal thoughts and it would be four to five days of him waiting to go to a facility in [place]. They recommended him for PHP to start 4/16. She picked him up today. He is worried about these thoughts coming back up. Also, [facility] came out yesterday to assess him and offered counseling from [facility]. He wants to do counseling with them.  Any input on care he can receive?  He is so discouraged and wants to be safe.  (She notes that the [facility] gave him- Zyprexa  10mg  at 1:07am and again at 1:25am per the med list- based on his previous med list) He was supposed to be off of Zyprexa10mg . Has been on  Clozapine  since Monday. He did go ahead and take it before he left ( 2pills) so he hasn't missed a dose and did go get labwork done. Is wanting to continue the Clozapine , please check the labwork to be able to fill the 100mg  dose.    MD resp:  Continue all meds as RX except go ahead and increase clozapine  to 100 mg tablets, 1 each night as soon as he can get the 100 mg tablets from pharmacy.  RX sent.  Trying to speed up his chance of response by getting it to a hgiher dose.  Clozapine  is very effective for HI/SI.  If he gets worsening HI/SI in the interim it will not be caused by meds but just the natural waxing and waining of his sx.  Once clozapine  dose gets  high enough it should help but avg patient needs 300 mg so we have to just be patient.      08/20/22 appt:  Up to 100 mg clozapine  HS SE constipation and using Colace, Miralax .  Been to [facility] twice  since here.   Ongoing continuous HI/SI.   All I want to do is go to sleep to escape.  Dep causing the thoughts.   Taking Ativan  0.5 mg BID-QID.  He thinks mother not giving him enough of it.  He thinks anxiety triggers HI/SI.  He fears HI and hopes he's strong enough to reisist.  Not angry or wanting to hurt anyone in reality but has the intrusive  thoughts. Conc and memory still not good.   Sleep not much different.   Staying with parents currently. Dep gotten worse M worrying he's dependent on lorazepam .   Confusion at night is no longer happening.  Not wandering at night.  Plan: For severe constipation start Linzess  145 mg AM Get lithium  level and kidney test ASAP Stopped fluvoxamine  and confusion at night is gone  lithium  300 mg capsule 1 at night  olanzapine  to 20 mg at night  increase clozapine  to 150 mg at night for 3 nights then 200 mg HS   olanzapine  20 mg each evening. Continue lorazepam  0.5 mg QID, needs this and informed mother  09/07/22 appt noted: Psych meds: clozapine  250 MG HS, lithium  300 mg HS, lorazepam  0.5 mg BID-QID, olanzapine  20 .  Off Seroquel , linzess  290 mg daily, Miralax , Colace. Sleep 7-8 hours without change from before clozapine .   Mood is still very depressed and still gets anxiety. Still problems with constipation.   Dep is so much it is causing intrusive HI/SI.   No more bizarre behaviors at night.   Plan: Plan: Get lithium  level and kidney test ASAP  lithium  300 mg capsule 1 at night  olanzapine  to 20 mg at night  increase clozapine  to  300 mg HS as soon as tolerated and then 400 mg HS.  olanzapine  20 mg each evening. Continue lorazepam  0.5 mg QID, needs this and informed mother  10/07/22 appt noted: alone and with parents Rough month.  Altered voice for a couple of weeks or so..  Mind not good.  Feel out of sorts.  Jittery.  Constipation.  No appetite.  On Wegovy.  Saw GI doc.   May need to reduce Wegovy bc poor appetite and constipation.  Lost 25 #. Current psych med: clozapine  400 mg HS about a week or 2, olanzapine  20 mg HS, lithium  300 mg HS, lorazepam  0.5 mg QID. Reduced intrusive thoughts.  2 nights since here night eating.   Sleep 6 hours.  Limited napping during the day.   Anhedonia.  Lower motivation.  Tremor worse. M says he sleeps pretty well but awakens  BP is under  control. Plan Reduce lithium  to 150 mg capsule 1 at night DT level higher Reduce  olanzapine  to 10 mg at night  Continue clozapine  400 mg HS bc helping SI and HI and  Continue lorazepam  0.5 mg QID, needs this and informed mother  11/08/22 appt noted:  alone and with parents. Ongoing calls between visits Doesn't think clozapine  helping him sleep as well as olanzapine  for sleep or any other benefit. While at restaurant had intrusive HI/SI. Staying with parents seems to provoke intrusive HI.  Is grateful to them.  Sat woke up with them.  Sometimes goes away and sometimes not.  No know trigger and  no intent. Saw PCP.  Off Wegovy.  Started metformin . No effect noted from clozapine .   M limits his lorazepam .  PCP referring him to neuro to FU abnormal head scan.  He wonders if his speech and memory problems are related.   Plan: Cont clozapine  400 mg HS Check clozapine  level and then direct dosing.  11/16/22 TC from pt to disc level:  MD resp:  Clozapine  level ok but may benefit with increase to 500 mg nightly.  Tell him to increase with current supply.     12/06/22 TC from pt:  complaining of worsening intrusive ego-dystonic HI/SI without intent, desire or plan.   MD resp ok to increase clozapine  to 600 mg HS.  12/09/22 appt noted: Complaining of ongoing racing thoughts and worsening HI/SI without intent or desire or plan.  Racing thoughts chronic and other thoughts wax and wane.  Usually triggered if starts thinking of something negative Meds: clozapine  600 mg HS, lithium  150, lorazepam  0.5 mg QID, olanzapine  10 HS Sleep with EFA.   Will have trouble going to sleep sometimes with thoughts. Doesn't think clozapine  makes him sleepy.  Not really drowsy daytime. No dx OSA and no known apnea.  SE a little dizzy.  Constipation managed with BM every 2-3 days. Better than before.  PCP RX metformin  and it helped. Plan: Plan for intrusive HI/SI wean olanzapine  and trial of risperidone .  Reduce olanzapine   to 5 mg HS for 1 week then stop it.  Addressed his fears of not sleeping off olanzapine . Then if sleeping start risperidone .  01/11/23 appt noted:  also seen with parents Psych meds: clozapine  600, lorazepam  0.5 mg QID, No olanzpaine ,  lithium  150 mg daily, no risperidone  yet. Lost 45# on Wegovy down to 150#.   Still having intrusive HI/SI as noted before. Sleep no worse.  Takes a couple of hours after clozapine  to sleep.  EFA 2-3 times nocturia.   Still racing thoughts.  Ongoing px with conc, memory,  planning Scored 73 on 100 pt test for cognition.   Some trouble with articulation and speech at times.   If could get rid of racing and intrusive thoughts I'd be 100% better. Neuro says no stroke history and low B12 and workup ongoing at [facility], Dr. IZORA Fairly. Memory is still terrible.   Plan: Start risperidone  2 mg tablet, 1 at night for racing thoughts and intrusive homicidal and suicidal thoughts. Call in 3 weeks if not better and we will increase the dose  02/09/23 appt: seen also with parents , 60 min  Lost to 152#. Meds: as above, risperidone  incr to 4.5 mg HS for intrusive HI/SI SE drooling off an on.  Tremor mainly worse with a little more risperidone  HI/SI intrusive thoughts wax and wane.  Worse when lays down. Takes 2 hours to fall asleep.  Needs to get OOB when awakens.  6 hours sleep. No falls.   Would rather try to increase risperidone  than other options.    04/12/23 appt virtual:  seen alone CC:  Multiple phone calls about ongoing intrusive, obsessive, ego dystonic HI/SI thoughts without plan or intent. Didn't come to the office bc of these thoughts.  I know I don't want to do it.  Can last four hours.  Sees drug advertisements that talk about SI and that will trigger obsessions about it. Meds: clozapine  600 mg HS,  lithium  150 mg HS, lorazepam  0.5 mg TID, risperidone  increased to 8 mg and now back to 4mg  HS.  Neuro sched MRI tomorrow. Enough sleep 5-6 hours at night.  Not  napping.   Sometimes gets confused with med but not ususally.   No recent mania.   Depressed and anxious. Plan: AVS: Reduce risperidone  to 1/2 of 4 mg tablet in evening for 1 week then stop it. Reduce clozapine  to 5 tablets nightly Start fluvoxamine  100 mg tablet 1/2 tablet nightly for 1 week then 1 tablet nightly for obsessive, intrusive thoughts  05/25/23 appt in person:  not with parents, met with [facility] with 2 sz and then had psych hosp since here.  Another EEG 06/03/23.  Neuro DR.  Loreli.  RX Keppra .  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 Psych med: clozapine  reduced to 300 HS, stopped ? Risperidone  he's not sure, clonazepam  0.5 mg BID, lithium  150 HS, trazodone  50 prn rare.  Keppra  500 BID. Trazodone  good sleep med. SE drooling again.   Not sure any mental health effect of Keppra .   Denies any street drugs despite positive UDS for ecstasy. Overall still having intrusive HI/SI without intent or plan chronically but maybe is a bit better than before hosp stay. Sleep is better.  Lost 40# on Wegovy.  Plan no change  06/22/23 appt noted: EEG shows some epileptiform activity with FU pending with another 4 hour EEG pending. Meds unchanged Overall mental health about the same or a tad bit better.  Seems he can distract himself from intrusive thoughts and they are less common than they were.   CC mouth watering from med.  Choking middle of the night.  Atropine  helps some but has to be repeated and pain in the ass.   Sleep 6 hours.  Sometimes taking trazodone  50.  Sleep is not that bad.  Psych med: clozapine  reduced to 300 HS, , clonazepam  0.5 mg BID, lithium  150 HS, trazodone  50 prn rare.  Keppra  500 BID. Plan no changes  07/20/23 appt noted:  alone and with parents. Psych med: clozapine  reduced to 300 HS, , clonazepam  0.5 mg BID, lithium  150 HS, trazodone  50 prn rare.  Keppra  500 BID. B12 oral.  EEG sched for 08/2023; appt 08/25/23 Maybe a little better  but still same intrusive thoughts.  They cause him to have somatic anxiety like palpitations.  You know I don't want to do any of those thoughts.    Out of the blue.   SE still drooling.  Using drops some.  But not often. Anna Slayden therapist in [place].  Likes her.  She's good at teaching . Awakening 2-3 times night nocturia usually.  Back to sleep ok Parents think he is better with more interest. Seems less disturbed .  More outgoing and social and mother agrees. Switched lorazepam  to clonazepam  0.5 mg BID seems to be working better and more calming and lasts longer.  08/18/23 appt noted: Med: clozapine  reduced to 300 HS, , clonazepam  0.5 mg BID, lithium  150 HS, trazodone  50 prn rare.  Keppra  500 BID. B12 oral.  Appt next week with Dr. Loreli to review EEG. No med changes.  Still on above. SE drooling. No effect from atropine  2 drops . Mood not worse but not a whole lot better Anxiety still better with clonazepam  vs lorazepam . Sleep pretty good 5-6 hours and would like to sleep more.  Nocturia.   Not sedated from clozapine .  Rare naps. No other medical px except about EEG.   Low motivation bc doesn't feel like it.  So disgusted with lack of  response with meds.   Does limited chores.  Has cleaning lady.  At parent's house off and on. Go through the motions.  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  Just lay around.   Plan: No med changes.  Will wait to make med changes until after EEG work up completed.    09/19/23 appt noted:  disc plan with parents.  Med: clozapine  reduced to 300 HS, , clonazepam  0.5 mg BID, lithium  150 HS, trazodone  50 prn rare.  Keppra  500 BID. B12 oral.  Neuro  Dr Maree EEG spikes not likely to cause psych sx Frustrated with chronic psych sx dep.  Gets angry over the fact that no meds seem to help. Goes out to eat and enjoys food but doesn't feel like doing much.   No sig change in psych sx.  Chronic anxiety and dep.  Anxiety is better with clonazepam  than other meds.  Will  get triggered in loud places with agitation.  Low tolerance of stimulation. OCD therapist Anna Slayden , [place], said she couldn't help his chronic intrusive HI/SI.   Still has spells of intrusive HI/SI Plan no changes  10/24/23 appt noted:  parents here today Med: clozapine  reduced to 300 HS, , clonazepam  0.5 mg BID, lithium  150 HS, trazodone  50 prn occ.  Keppra  500 BID. B12 oral.  Atropine  drops at night.   No SZ in over 6 mos.  Not driving again yet. Trazodone  works when needed.  Likes it .   Some panic but less severe and frequent.  Not as bad.   SE no much except drooling ongoing.  Uses atropine  drops at night Don't like things I used to like but not really new.   Clozapine  good for sleep.  Still some intrusive thoughts but not as bad as they were. F 53 yr at [employer].  Generally dep is worse than anxiety.  Anhedonia and intrusive thoughts.  Plan: no changes  12/14/23 appt noted:  Med: clozapine   300 HS, , clonazepam  0.5 mg BID, lithium  150 HS, trazodone  50 prn occ.  Keppra  500 BID. B12 oral.  Atropine  drops at night.   Intrusive thoughts and SI gone down some.   Atropine  drops not working well enough so not using it.   Doesn't stop drooling at night.   SE drooling and constipation.  Seeing GI Chronic dep and anxiety .  Some ongoing anhedonia.  Would like more relief.  No SZ and no stroke like SX Trazodone  works when needed.  Some dep cycling without pattern or pcpt. M helps with meds.  BC is still forgetful  Plan: Retry Vraylar 1.5 mg every other day  12/23/23 TC Pt reports he took Vraylar 4 days, W-F-M-W and he started having SI on Tuesday.  He reports sx were less yesterday and a little better today, has not taken it today. He said you told him he shouldn't have SI with it. No other sx reported. The suicidal thoughts were no worse than what he has reported previously, but said he is pretty good at gaging his sx and was afraid to continue the medication. Reports  he feels so bad all the time and he is anxious to feel better. Has FU 9/17.  Took it MW for 2 weeks without akathisia, but then intrusive worsening of SI    MD resp: DC Vraylar  01/18/24 appt noted:  Med: clozapine   300 HS, , clonazepam  0.5 mg BID, lithium  150 HS, trazodone  50 prn occ.  Keppra  500 BID.  B12 oral.  Atropine  drops at night.   Foul mood with irritability.  SI better off Vraylar.  Down bc tried so many things that haven't worked.   Gets out and does things but doesn't really want to do it.  Not much to do during the week and just lies around.  Does not do many chores.   Anhedonia and ex not doing Ham radio as in past.   FU neurology in Oct and will disc memory concerns. Still feels need for mother to help with meds.  Plan: trial Fanapt .  02/15/24 appt noted:  Med: Fanapt  4 mg HS, clozapine   300 HS, , clonazepam  0.5 mg BID, lithium  150 HS, trazodone  50 prn occ.  Keppra  500 BID. B12 oral.  Atropine  drops at night.   No better with dep.  Not as many intrusive thoughts but still down.  No clear change in anxiety. SE No lightheadedness, akathisia. Minimal napping.  Sleep 6 hours or more at night Sees Dr. Maree neuro next week.  Residual stroke sx speech and memory.  Rarely needing trazodone .   No other SE issues.   Still will tend to ruminate on old relationship, causes anxiety though no contact with her. Plan: No effect with Fanapt  4 mg nightly.   Fanapt  Increase to 8 mg nightly for 2 weeks.   then if well tolerated but no benefit increase to  12 mg nightly.    03/21/24 appt noted: Med: clozapine   300 HS, , clonazepam  0.5 mg BID, lithium  150 HS, trazodone  50 prn occ.  Keppra  500 BID. B12 oral.  Atropine  drops at night.   Increased Fanapt  to 12 mg HS for 2 weeks No better and no worse.  Still doesn't feel like he enjoys things; anhedonia.  Does not get the joy out of things.   No SE with it re: Fanapt . Plan: No effect with Fanapt   12 mg nightly.  But tolerated. But first,  increase Fanapt  to 18 mg nightly for 1 week (12 + 6 mg), Then increase Fanapt  to 20mg  nightly (12 + 8 mg ) for 1 week.   If no benefit then wean off it by dropping to 12 mg 1 night, then reduce to 8 mg 1 night, then reduce to 4 mg 1 night, then reduce to 2 mg 1 night, then stop it. If this fails we will retry lamotrigine  bc never used in combo with clozapine .  04/18/24 appt noted:   Med: clozapine   300 HS, , clonazepam  0.5 mg BID, lithium  150 HS, trazodone  50 prn occ.  Keppra  500 BID. B12 oral.  Atropine  drops at night.   Increased Fanapt  to 12 mg HS for 6 weeks Wanted to take Fanapt  20 mg longer.  He called and asked for 20 mg Fanapt  longer.   Doesn't want to take anything he has to take more than twice daily. But willing to do so. No SE no benefit with current meds. No change in chronic dep.  Anhedonia.   Still quite a bit of anxiety.   Sleep is good.   Plan: Increase Fanapt  to 8 mg in the AM and continue 12 mg nightly for 1 week, then increase Fanapt  to 12 mg twice daily until the next appt. Start lamotrigine  for for depression 25 mg tablets , 1 daily for 2 weeks, then 2 tablets daily for 2 weeks then 4 tablets daily. retry lamotrigine  bc never used in combo with clozapine .  05/16/24 appt noted:  Med: increased Fanapt  to 12 mg BID,  lamotrigine  50, clozapine   300 HS, , clonazepam  0.5 mg BID, lithium  150 HS, trazodone  50 prn occ.  Keppra  500 BID. B12 oral.  Atropine  drops at night.   Don't feel well at all. Still obsessing over prior relationship he ended.  Will get down over it, though I know I don't love her.  Is better off if not thinking about it.  It flared up yesterday.  No trigger yesterday.  It just comes and goes.  No benefit from Fanapt .  Tolerating it.  Not suicidal or homicidal.  Ongoing dep and anxiety as noted.  No sig manic sx lately. Just been real depressed. Didn't feel comfortable driving so fathe rdrove him.  Decided more even anxiety benefit with clonazepam  vs  lorazepam .  Anxiety less on clonazepam .  Psych med hx extensive including ECT and  Risperidone  Maxed out risperidone  8 mg without benefit for intrusive thoughts  Zyprexa  30 Latuda 80 which caused akathisia,  Vraylar 1.5 every other day for 2 weeks; worsening intrusive thoughts,  Rexulti, aripiprazole 20 mg with akathisia,  Seroquel  1000 mg,  InVega, Geodon , Saphris with side effects, symbyax, Fanapt  4 HS .  Caplyta  SE and markedly worse. Clozapine  started end of March 2024; highest dose 600 highest dose lithium  1200 SE,   lamotrigine  300 mg, Depakote 2000 mg, Tegretol, Trileptal and several of these in combinations, gabapentin , Keppra   N-acetylcysteine, Nuedexta,     Belsomra with no response,  Lunesta no response, trazodone  200 mg,  Xanax , clonazepam , lorazepam  less sedation. Buspirone  NR Clonidine  SE with 2 trials  Viibryd 40 mg for 3 months with diarrhea,  protriptyline with side effects,  Trintellix 20 mg,  Parnate 50 mg with no response,   Emsam 12 mg for 2 months,   imipramine,  venlafaxine,   bupropion side effects,  Lexapro 20 mg, sertraline, paroxetine , Deplin, fluoxetine  80,  fluvoxamine  150 agitated; retrial ? Sz related.   Auvelity NR at one daily.  SE BID  methylphenidate 60 mg,  Vyvanse, Concerta, strattera, , modafinil,  Memantine  worsening SI? Donepezil , complained of HI, Exelon  3 BID rivastigmine  DT worsening SI  pramipexole,  amantadine ,  Patient prone to akathisia.  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 Neuro [facility] Dr. IZORA Fairly, [facility] in progress.   EEG on 06/03/23 bc recent sz 08/25/23 appt Dr. 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 No breakthrough seizures since last visit. Currently on levetiracetam  500 mg twice daily. Requests a once-daily regimen. Epileptiform discharges necessitate continued anti-seizure medication to  mitigate recurrent seizure risk. Discharges are insufficient to cause psychiatric issues. Levetiracetam  XR offers once-daily dosing convenience without altering the total daily dose. - Switch to levetiracetam  1000 mg XR once daily, to be taken at nighttime.    Last therapist in [place] dismissed him in part bc not covered by insurance.    Review of Systems:  Review of Systems  Constitutional:  Positive for appetite change and fatigue.  Cardiovascular:  Negative for chest pain and palpitations.  Gastrointestinal:  Positive for constipation.  Musculoskeletal:  Positive for back pain.  Neurological:  Positive for dizziness and tremors. Negative for weakness.       Fidgety  Psychiatric/Behavioral:  Positive for decreased concentration and dysphoric mood. Negative for agitation, behavioral problems, hallucinations, self-injury, sleep disturbance and suicidal ideas. The patient is nervous/anxious. The patient is not hyperactive.     Medications: I have reviewed  the patient's current medications.  Current Outpatient Medications  Medication Sig Dispense Refill   acetaminophen  (TYLENOL ) 500 MG tablet Take 1,000 mg by mouth every 6 (six) hours as needed for moderate pain.     atropine  1 % ophthalmic solution PLACE 2 DROPS UNDER THE TONGUE THREE TIMES PER DAY AS NEEDED AS DIRECTED 5 mL 2   cholecalciferol  (VITAMIN D3) 25 MCG (1000 UNIT) tablet Take 1,000 Units by mouth daily.     clonazePAM  (KLONOPIN ) 0.5 MG tablet TAKE ONE (1) TABLET BY MOUTH TWO TIMES PER DAY 60 tablet 0   cloZAPine  (CLOZARIL ) 100 MG tablet Take 3 tablets (300 mg total) by mouth at bedtime. 270 tablet 0   Cyanocobalamin  (VITAMIN B-12 IJ) Inject as directed every 30 (thirty) days.     esomeprazole (NEXIUM) 40 MG capsule Take 40 mg by mouth daily.     fenofibrate  (TRICOR ) 145 MG tablet Take 145 mg by mouth daily.     [START ON 05/31/2024] iloperidone  (FANAPT ) 4 MG TABS tablet Take 2 tablets (8 mg total) by mouth 2 (two) times  daily for 14 days, THEN 1 tablet (4 mg total) 2 (two) times daily for 14 days. Then stop it. 84 tablet 0   levETIRAcetam  (KEPPRA ) 500 MG tablet Take 1 tablet (500 mg total) by mouth 2 (two) times daily. 60 tablet 0   levothyroxine  (SYNTHROID , LEVOTHROID) 75 MCG tablet Take 75 mcg by mouth daily before breakfast.     LINZESS  290 MCG CAPS capsule Take 290 mcg by mouth daily.     lithium  carbonate 150 MG capsule Take 1 capsule (150 mg total) by mouth at bedtime. 90 capsule 1   metFORMIN  (GLUCOPHAGE -XR) 500 MG 24 hr tablet Take 500 mg by mouth 2 (two) times daily with a meal.     sildenafil  (VIAGRA ) 100 MG tablet Take 1 tablet (100 mg total) by mouth daily as needed for erectile dysfunction. 30 tablet 6   tamsulosin  (FLOMAX ) 0.4 MG CAPS capsule Take 1 capsule (0.4 mg total) by mouth daily. 90 capsule 3   traZODone  (DESYREL ) 50 MG tablet Take 1 tablet (50 mg total) by mouth at bedtime as needed for sleep. 90 tablet 0   lamoTRIgine  (LAMICTAL ) 100 MG tablet 1 tablet daily for 2 weeks then 1 and 1/2 tablets daily 45 tablet 1   No current facility-administered medications for this visit.   Medication Side Effects: Other: mild sleepiness.  Occ twitches.\, tremor  Allergies:  Allergies  Allergen Reactions   Meloxicam Other (See Comments)    Dizziness   Zyprexa  [Olanzapine ] Other (See Comments)    Akathesia   Nsaids Other (See Comments)    Hallucinations   Ibuprofen Other (See Comments)    Can not take because taking lithium    Prednisone Other (See Comments)    Can't sleep    Wellbutrin [Bupropion] Other (See Comments)    Suicidal thoughts    Past Medical History:  Diagnosis Date   ADHD (attention deficit hyperactivity disorder)    Anemia    Anxiety    Bipolar disorder (HCC)    BPH (benign prostatic hyperplasia)    Chronic kidney disease, stage 3b (HCC)    Constipation    DDD (degenerative disc disease), cervical    Depression    Deviated septum    ED (erectile dysfunction)    GERD  (gastroesophageal reflux disease)    Graves disease    History of hiatal hernia    Hypertension    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    Hypothyroidism    Pneumonia    PONV (postoperative nausea and vomiting)     Family History  Problem Relation Age of Onset   Prostate cancer Neg Hx    Bladder Cancer Neg Hx    Kidney cancer Neg Hx     Social History   Socioeconomic History   Marital status: Single    Spouse name: Not on file   Number of children: Not on file   Years of education: Not on file   Highest education level: Not on file  Occupational History   Not on file  Tobacco Use   Smoking status: Former    Current packs/day: 0.00    Types: Cigarettes    Quit date: 2010    Years since quitting: 16.0    Passive exposure: Past   Smokeless tobacco: Former    Types: Associate Professor status: Never Used  Substance and Sexual Activity   Alcohol use: Not Currently   Drug use: Never   Sexual activity: Yes  Other Topics Concern   Not on file  Social History Narrative   Not on file   Social Drivers of Health   Tobacco Use: Medium Risk (05/16/2024)   Patient History    Smoking Tobacco Use: Former    Smokeless Tobacco Use: Former    Passive Exposure: Past  Programmer, Applications: Low Risk  (11/08/2023)   Received from [facility]   Overall Financial Resource Strain (CARDIA)    Difficulty of Paying Living Expenses: Not very hard  Food Insecurity: No Food Insecurity (11/08/2023)   Received from [facility]   Epic    Within the past 12 months, you worried that your food would run out before you got the money to buy more.: Never true    Within the past 12 months, the food you bought just didn't last and you didn't have money to get more.: Never true  Transportation Needs: No Transportation Needs (11/08/2023)   Received from [facility] - Transportation    In the past 12 months,  has lack of transportation kept you from medical appointments or from getting medications?: No    Lack of Transportation (Non-Medical): No  Physical Activity: Not on file  Stress: Not on file  Social Connections: Not on file  Intimate Partner Violence: Not At Risk (04/17/2023)   Humiliation, Afraid, Rape, and Kick questionnaire    Fear of Current or Ex-Partner: No    Emotionally Abused: No    Physically Abused: No    Sexually Abused: No  Depression (PHQ2-9): Not on file  Alcohol Screen: Low Risk (04/17/2023)   Alcohol Screen    Last Alcohol Screening Score (AUDIT): 0  Housing: Low Risk  (05/07/2024)   Received [facility]   Epic    In the last 12 months, was there a time when you were not able to pay the mortgage or rent on time?: No    In the past 12 months, how many times have you moved where you were living?: 0    At any time in the past 12 months, were you homeless or living in a shelter (including now)?: No  Utilities: Not At Risk (11/08/2023)   Received from [facility]   Epic    In the past 12 months has the electric, gas, oil, or water company threatened to shut off  services in your home?: No  Health Literacy: Not on file    Past Medical History, Surgical history, Social history, and Family history were reviewed and updated as appropriate.   Please see review of systems for further details on the patient's review from today.   Objective:   Physical Exam:  There were no vitals taken for this visit.  Physical Exam Constitutional:      General: He is not in acute distress.    Appearance: He is well-developed.  Musculoskeletal:        General: No deformity.  Neurological:     Mental Status: He is alert and oriented to person, place, and time.     Cranial Nerves: No dysarthria.     Motor: Tremor present.     Coordination: Coordination normal.     Comments: mild tremor  Psychiatric:        Attention and Perception: Attention and  perception normal. He does not perceive auditory or visual hallucinations.        Mood and Affect: Mood is anxious and depressed. Affect is not labile, blunt, angry or tearful.        Speech: Speech normal. Speech is not slurred.        Behavior: Behavior normal. Behavior is not agitated or slowed. Behavior is cooperative.        Thought Content: Thought content is not delusional. Thought content does not include homicidal or suicidal ideation. Thought content does not include suicidal plan.        Cognition and Memory: Cognition normal. He exhibits impaired recent memory.        Judgment: Judgment normal.     Comments: Insight fair Chronically talkative .directable. But chronically mildly pressured but less so  Easily confused and memory gradually worse over time. He is chronically anxious  .  No manic signs noted. Anhedonia ongoing Intrusive obsessions episodic HI/SI are better  but not gone and do cycle.  Lately more obs around ex GF Alert and oriented.      Lab Review:     Component Value Date/Time   NA 144 04/17/2023 0314   NA 135 10/20/2022 1123   NA 142 03/20/2014 0514   K 4.4 04/17/2023 0314   K 4.1 03/20/2014 0514   CL 109 04/17/2023 0314   CL 112 (H) 03/20/2014 0514   CO2 28 04/17/2023 0314   CO2 26 03/20/2014 0514   GLUCOSE 92 04/17/2023 0314   GLUCOSE 99 03/20/2014 0514   BUN 16 04/17/2023 0314   BUN 22 10/20/2022 1123   BUN 7 03/20/2014 0514   CREATININE 1.43 (H) 04/17/2023 0314   CREATININE 0.78 03/20/2014 0514   CALCIUM  8.7 (L) 04/17/2023 0314   CALCIUM  9.5 03/20/2014 0514   PROT 6.5 04/14/2023 1047   PROT 7.3 03/18/2014 1459   ALBUMIN 3.8 04/14/2023 1047   ALBUMIN 3.2 (L) 03/18/2014 1459   AST 21 04/14/2023 1047   AST 16 03/18/2014 1459   ALT 10 04/14/2023 1047   ALT 16 03/18/2014 1459   ALKPHOS 39 04/14/2023 1047   ALKPHOS 105 03/18/2014 1459   BILITOT 0.3 04/14/2023 1047   BILITOT 0.3 03/18/2014 1459   GFRNONAA 56 (L) 04/17/2023 0314   GFRNONAA  >60 03/20/2014 0514   GFRAA 54 (L) 04/18/2019 1612   GFRAA >60 03/20/2014 0514       Component Value Date/Time   WBC 5.5 04/11/2024 1000   WBC 6.5 04/21/2023 0946   RBC 4.12 (L) 04/11/2024 1000   RBC  3.92 (L) 04/21/2023 0946   HGB 13.0 04/11/2024 1000   HCT 38.9 04/11/2024 1000   PLT 375 04/11/2024 1000   MCV 94 04/11/2024 1000   MCV 89 03/20/2014 0514   MCH 31.6 04/11/2024 1000   MCH 30.9 04/21/2023 0946   MCHC 33.4 04/11/2024 1000   MCHC 32.7 04/21/2023 0946   RDW 12.2 04/11/2024 1000   RDW 12.5 03/20/2014 0514   LYMPHSABS 2.4 04/11/2024 1000   LYMPHSABS 3.2 03/20/2014 0514   MONOABS 0.4 04/21/2023 0946   MONOABS 1.0 03/20/2014 0514   EOSABS 0.5 (H) 04/11/2024 1000   EOSABS 0.4 03/20/2014 0514   BASOSABS 0.1 04/11/2024 1000   BASOSABS 0.1 03/20/2014 0514    Lithium  Lvl  Date Value Ref Range Status  10/13/2023 0.2 (L) 0.5 - 1.2 mmol/L Final    Comment:    A concentration of 0.5-0.8 mmol/L is advised for long-term use; concentrations of up to 1.2 mmol/L may be necessary during acute treatment.                                  Detection Limit = 0.1                           <0.1 indicates None Detected    08/12/21 lithium  level 0.9 on 450 mg.  lithium  level Sept 0.8.   lithium  level July 27, 2018 was normal at 1.0.   Lithium  level [facility] October 03, 2018 = 1.2. Said he got lithium  level at [facility] as requested.  Labs not in Epic.  Recent lipids ok except higher TG than usual.  Normal A1C.  11/10/22 Checked clozapine  level on 400 mg HS= cloz 733+norclozapine 294 = total 1027  06/07/23  Vitamin B12 >300 pg/mL 848  On oral supplement  Assessment: Plan:    Jabin was seen today for follow-up, depression, anxiety and medication reaction.  Diagnoses and all orders for this visit:  Severe bipolar I disorder, current or most recent episode depressed (HCC) -     iloperidone  (FANAPT ) 4 MG TABS tablet; Take 2 tablets (8 mg total) by mouth 2 (two) times daily for 14 days,  THEN 1 tablet (4 mg total) 2 (two) times daily for 14 days. Then stop it. -     lamoTRIgine  (LAMICTAL ) 100 MG tablet; 1 tablet daily for 2 weeks then 1 and 1/2 tablets daily  GAD (generalized anxiety disorder)  Panic disorder with agoraphobia  Other obsessive-compulsive disorders  Insomnia due to mental condition  Mild cognitive impairment  Long term current use of clozapine   Low serum vitamin B12       60 min appt with pt  needs a lot of time   .   Seen alone   Chronic TR bipolar mixed and chronic anxiety.  He usually has mixed bipolar symptoms which we have not been able to completely eliminate.  See long list of meds tried.   He is  chronically unstable .  Mood is relatively stable except for the severe intrusive, ego-dystonic persistent obsessive HI/SI without intent or plan.  No better no worse.  Residual anhedonia. Hx chronic intrusive ego dystonic thoughts HI/SI.  More like obsessions than true HI/SI.   No recent mania.  Chronic dep and anxiety.  Obsessive thoughts trigger more dep and that is the case lately.  But is a little better since being on Keppra .  Psych hosp 08/02/22 with SI and acute kidney injury and elevated lithium . Processed this and disc goal of trying to stop lithium  but risk of worsening SI.  He has chronic SI and there's risk of this worsening off lithium .  Lithium  and clozapine  are the most effective meds for SI.    hosp for SZ episodes.  Fluvox stopped and clozapine  reduced to 300 mg HS.   Was RX risperidone  but he stopped that after DC.  It was ineffective in at least 2 trials.  We discussed the short-term risks associated with benzodiazepines including sedation and increased fall risk among others.  Discussed long-term side effect risk including dependence, potential withdrawal symptoms, and the potential eventual dose-related risk of dementia.  But recent studies from 2020 dispute this association between benzodiazepines and dementia risk. Newer  studies in 2020 do not support an association with dementia.  Unfortunately due to the severity of set his symptoms polypharmacy is a necessity.    Lithium  being used bc chronic SI and death thoughts as well as neuroprotective effect.  Counseled patient regarding potential benefits, risks, and side effects of lithium  to include potential risk of lithium  affecting thyroid  and renal function.  Discussed need for periodic lab monitoring to determine drug level and to assess for potential adverse effects.  Counseled patient regarding signs and symptoms of lithium  toxicity and advised that they notify office immediately or seek urgent medical attention if experiencing these signs and symptoms.  Patient advised to contact office with any questions or concerns.  Reduced lithium  to 150 mg daily Dt hx toxicity Checked lithium  level and BMP and level 1.1 on 300 mg Daily so reduced again to 150 mg daily and level 0.4 May 07, 2024 Call if death thoughts worsen or worsening SI.  Disc clozapine  and it's level..  Disc data showing less SI with it but he's had no changes.    Disc risk in detail including low WBC with complication, myocarditis.  Extensive discussion of CBC monitoring.  Disc going higher despite level and checking ECG bc cardiac risk at higher doses.   Disc this again bc taking Wegovy might help him to tolerate it better.   clozapine  600 mg HS was Started 12/07/22.  Started clozapine  08/05/22. 11/10/22 Checked clozapine  level on 400 mg HS= cloz 733+norclozapine 294 = total 1027 Reduced to 300 mg HS at the hospital. CBC every 12 weeks, now that on it over a year.  And DT recent FDA changes in recommendation.  Recent labs 12/25 normal.   Clozapine  helps mania but not much with depression.  Use the atropine  drops prn 2-4 on each side.  He says he doesn't think it helps.  Did he take duloxetine?  Might help depression more.   Consider retry pramipexole off label but tricky with clozapine .   Consider  Qelbree.  Or retry lamotrigine  with clozapine .  Answered his questions about ketamine and FDA indications.  He understands it is off label.   Switched lorazepam  to clonazepam  0.5 mg BID  working better and more calming and lasts longer. M administers  Discussed safety plan at length with patient.  Advised patient to contact office with any worsening signs and symptoms.  Instructed patient to go to the [facility] emergency room for evaluation if experiencing any acute safety concerns, to include suicidal intent.  He commits to safety. His HI/SI that he complains about are intrusive ego-dystonic obsessions and not true HI SI in that there's no desire or intent ever.  Off GLP-1.  Has had weight gain from meds as a contributor. Lost 45# with Wegovy and 150#.    Needs to keep up activity as much as possible for depression.  For severe constipation seeing GI  Historically he has not done well with serotonergic medicines which would typically be used for obsessive thoughts.  But we are running out of options.   Repeat ECT not good option bc being treated for SZ.   Disc memory concerns and mother's memory concerns bc had it before.  Disc counseling.  [email or web address] for therapist.  Most effective therapist will need to be expert in CBT.  He has one now.  This waxes and wanes.  I'm suggesting lamotrigine  in  combination with clozapine  and that has not been done.  Also Dr. Maree responded to my note about it and he agrees. increase lamotrigine  to 100 mg daily for 2 weeks then increase to 150 mg (or 1 and 1/2 of the 100 mg tablets) daily. [name] retry lamotrigine  bc never used in combo with clozapine .  DT TRD will likely try highest dose tolerated of lamotrigine  with this trial  Check on availability of discounted GLP-1 meds as of January 2026  Wean Fanapt  DT NR   Wean off Fanapt  by reducing to 2 of the 4 mg tablets twice daily for 1 week,  Then reduce to 1 of the 4 mg tablets twice daily for 1 week  then stop it.   Disc again that we are approaching the end of the options that I have for treatment and I encourage him to seek consultation elsewhere unless he wants to accept the status quo.  He has sought second opinion before and at this point I would rec a transfer of care rather than just a second opinion.  Repeated directions multiple times and gave written instructions.   Asked about neuromodulators for bipolar disorder.  None FDA approved for this except ECT  Disc SE  FU 4 weeks   Lorene Macintosh, MD, DFAPA  Future Appointments  Date Time Provider Department Center  06/19/2024  1:30 PM Cottle, Lorene KANDICE Raddle., MD [facility] None  07/16/2024  1:30 PM Cottle, Lorene KANDICE Raddle., MD [facility] None  08/16/2024  1:30 PM Cottle, Lorene KANDICE Raddle., MD [facility] None  09/13/2024  1:00 PM Cottle, Lorene KANDICE Raddle., MD [facility] None  12/26/2024  2:30 PM Francisca Redell BROCKS, MD [facility] None    No orders of the defined types were placed in this encounter.      -------------------------------

## 2024-05-24 ENCOUNTER — Telehealth: Payer: Self-pay | Admitting: Psychiatry

## 2024-05-24 ENCOUNTER — Other Ambulatory Visit: Payer: Self-pay | Admitting: Psychiatry

## 2024-05-24 DIAGNOSIS — F428 Other obsessive-compulsive disorder: Secondary | ICD-10-CM

## 2024-05-24 DIAGNOSIS — F411 Generalized anxiety disorder: Secondary | ICD-10-CM

## 2024-05-24 DIAGNOSIS — F4001 Agoraphobia with panic disorder: Secondary | ICD-10-CM

## 2024-05-24 DIAGNOSIS — F314 Bipolar disorder, current episode depressed, severe, without psychotic features: Secondary | ICD-10-CM

## 2024-05-24 NOTE — Telephone Encounter (Signed)
 Next visit is 06/19/24. Baby called asking to speak to Albert [name]. He was given Fanapt  by Dr. Geoffry. Warner said Dr. Calhoun directions and the bottle instructions are not the same. Albert [name] can you please call Kyro at [phone].

## 2024-05-24 NOTE — Telephone Encounter (Signed)
 He needs to follow the instructions I wrote on AVS.   That's why I gave him written instructions.  Ignore the bottle

## 2024-05-24 NOTE — Telephone Encounter (Signed)
 Pt had called previously. Awaiting response from Dr. Geoffry.

## 2024-05-24 NOTE — Telephone Encounter (Signed)
 Pt.notified

## 2024-05-24 NOTE — Telephone Encounter (Signed)
 Albert [name] LVM @ 12:22p stating he would just take the medication as Dr Geoffry had prescribed.  If you need to call him back, you can.

## 2024-05-24 NOTE — Telephone Encounter (Addendum)
 Jama called for clarification. AVS says to taper for 1 week each dose, but Rx says 14 days each dose.   From 1/14 note:  Wean off Fanapt  by reducing to 2 of the 4 mg tablets twice daily for 1 week,  Then reduce to 1 of the 4 mg tablets twice daily for 1 week then stop it.   From Rx:   Sig - Route: Take 2 tablets (8 mg total) by mouth 2 (two) times daily for 14 days, THEN 1 tablet (4 mg total) 2 (two) times daily for 14 days. Then stop it.   Rx qty is 29, which is enough for 2 weeks at each dose.

## 2024-05-29 ENCOUNTER — Other Ambulatory Visit: Payer: Self-pay

## 2024-05-29 MED ORDER — LAMOTRIGINE 150 MG PO TABS: 150.0000 mg | ORAL_TABLET | Freq: Every day | ORAL | 0 refills | Status: DC

## 2024-06-07 ENCOUNTER — Telehealth: Payer: Self-pay | Admitting: Psychiatry

## 2024-06-07 ENCOUNTER — Other Ambulatory Visit: Payer: Self-pay | Admitting: Psychiatry

## 2024-06-07 MED ORDER — LAMOTRIGINE 25 MG PO TABS: 50.0000 mg | ORAL_TABLET | Freq: Every day | ORAL | 0 refills | Status: AC

## 2024-06-07 NOTE — Telephone Encounter (Signed)
 Patsy mother called has questions about cognitive decline with pt. Weaning off Fanapt . Could this be the cause and how long will last.  Return call [phone]

## 2024-06-07 NOTE — Telephone Encounter (Signed)
 Spoke with Albert [name] mother, Albert [name]. She called to report a decline in his cognitive functioning. Last night was his final dose of Fanapt . She stated that since tapering off the medication, she has noticed increased difficulty with tasks such as putting on his coat and tying his shoes, and reports that his thinking appears impaired. Dizziness has returned, which she associates with the medication decrease, similar to when he initially started Fanapt .  Last night, Albert [name] went into the kitchen and told his mother he was thirsty. She reported that he appeared to be sleepwalking. He drank some juice and returned toward his bedroom. Shortly after, she heard him calling out to her. When she turned on the lights, she found him on the floor after bumping into a decorative tree. He was able to get himself up and return to bed independently.  Albert [name] stated that Albert [name] has also noticed some of the same issues she has observed. She is asking whether the cognitive changes could be related to discontinuing the medication also how long the medication remains in his system, and how long these effects might last.

## 2024-06-07 NOTE — Telephone Encounter (Signed)
 LVM to RC to dicuss message she left

## 2024-06-07 NOTE — Telephone Encounter (Signed)
 Weaning Fanapt  should not cause any cognitive problems.  I of course don't know the cause but if it gets worse suddenly he needs to go to ER. At the last appt we increased lamotrigine .  Have him cut dose back to 50 mg daily.  Possibly it might cause cognitive SE

## 2024-06-08 NOTE — Telephone Encounter (Signed)
 Spoke with Patsy on 2/5, explained your message to her and she verbally understood.

## 2024-06-19 ENCOUNTER — Ambulatory Visit: Admitting: Psychiatry

## 2024-07-16 ENCOUNTER — Ambulatory Visit: Admitting: Psychiatry

## 2024-08-16 ENCOUNTER — Ambulatory Visit: Admitting: Psychiatry

## 2024-09-13 ENCOUNTER — Ambulatory Visit: Admitting: Psychiatry

## 2024-10-17 ENCOUNTER — Ambulatory Visit: Admitting: Psychiatry

## 2024-12-26 ENCOUNTER — Ambulatory Visit: Admitting: Urology
